# Patient Record
Sex: Female | Born: 1945 | Race: White | Hispanic: No | Smoking: Former smoker
Health system: Southern US, Community
[De-identification: ages and names within clinical notes are randomized; demographics above are authoritative.]

## PROBLEM LIST (undated history)

## (undated) DIAGNOSIS — I2510 Atherosclerotic heart disease of native coronary artery without angina pectoris: Secondary | ICD-10-CM

## (undated) DIAGNOSIS — I4901 Ventricular fibrillation: Secondary | ICD-10-CM

## (undated) DIAGNOSIS — I119 Hypertensive heart disease without heart failure: Secondary | ICD-10-CM

## (undated) DIAGNOSIS — F17200 Nicotine dependence, unspecified, uncomplicated: Secondary | ICD-10-CM

## (undated) DIAGNOSIS — D649 Anemia, unspecified: Secondary | ICD-10-CM

## (undated) DIAGNOSIS — M545 Low back pain, unspecified: Secondary | ICD-10-CM

## (undated) DIAGNOSIS — Z9289 Personal history of other medical treatment: Secondary | ICD-10-CM

## (undated) DIAGNOSIS — E538 Deficiency of other specified B group vitamins: Secondary | ICD-10-CM

## (undated) DIAGNOSIS — I219 Acute myocardial infarction, unspecified: Secondary | ICD-10-CM

## (undated) DIAGNOSIS — I493 Ventricular premature depolarization: Secondary | ICD-10-CM

## (undated) DIAGNOSIS — G4730 Sleep apnea, unspecified: Secondary | ICD-10-CM

## (undated) DIAGNOSIS — K219 Gastro-esophageal reflux disease without esophagitis: Secondary | ICD-10-CM

## (undated) DIAGNOSIS — I10 Essential (primary) hypertension: Secondary | ICD-10-CM

## (undated) DIAGNOSIS — T7840XA Allergy, unspecified, initial encounter: Secondary | ICD-10-CM

## (undated) DIAGNOSIS — F419 Anxiety disorder, unspecified: Secondary | ICD-10-CM

## (undated) DIAGNOSIS — E785 Hyperlipidemia, unspecified: Secondary | ICD-10-CM

## (undated) DIAGNOSIS — J449 Chronic obstructive pulmonary disease, unspecified: Secondary | ICD-10-CM

## (undated) DIAGNOSIS — M1990 Unspecified osteoarthritis, unspecified site: Secondary | ICD-10-CM

## (undated) DIAGNOSIS — D126 Benign neoplasm of colon, unspecified: Secondary | ICD-10-CM

## (undated) DIAGNOSIS — G709 Myoneural disorder, unspecified: Secondary | ICD-10-CM

## (undated) DIAGNOSIS — K5790 Diverticulosis of intestine, part unspecified, without perforation or abscess without bleeding: Secondary | ICD-10-CM

## (undated) DIAGNOSIS — B356 Tinea cruris: Secondary | ICD-10-CM

## (undated) HISTORY — DX: Atherosclerotic heart disease of native coronary artery without angina pectoris: I25.10

## (undated) HISTORY — PX: ABDOMINAL HYSTERECTOMY: SHX81

## (undated) HISTORY — DX: Diverticulosis of intestine, part unspecified, without perforation or abscess without bleeding: K57.90

## (undated) HISTORY — DX: Anxiety disorder, unspecified: F41.9

## (undated) HISTORY — PX: BARIATRIC SURGERY: SHX1103

## (undated) HISTORY — PX: TONSILLECTOMY: SUR1361

## (undated) HISTORY — DX: Myoneural disorder, unspecified: G70.9

## (undated) HISTORY — PX: ULNAR NERVE REPAIR: SHX2594

## (undated) HISTORY — PX: APPENDECTOMY: SHX54

## (undated) HISTORY — DX: Essential (primary) hypertension: I10

## (undated) HISTORY — PX: CHOLECYSTECTOMY: SHX55

## (undated) HISTORY — DX: Tinea cruris: B35.6

## (undated) HISTORY — DX: Anemia, unspecified: D64.9

## (undated) HISTORY — PX: EYE SURGERY: SHX253

## (undated) HISTORY — PX: CERVICAL DISC SURGERY: SHX588

## (undated) HISTORY — PX: CARPAL TUNNEL RELEASE: SHX101

## (undated) HISTORY — PX: NEPHRECTOMY: SHX65

## (undated) HISTORY — DX: Hyperlipidemia, unspecified: E78.5

## (undated) HISTORY — DX: Nicotine dependence, unspecified, uncomplicated: F17.200

## (undated) HISTORY — DX: Personal history of other medical treatment: Z92.89

## (undated) HISTORY — PX: TUBAL LIGATION: SHX77

## (undated) HISTORY — DX: Ventricular premature depolarization: I49.3

## (undated) HISTORY — DX: Gastro-esophageal reflux disease without esophagitis: K21.9

## (undated) HISTORY — DX: Low back pain, unspecified: M54.50

## (undated) HISTORY — PX: LUMBAR LAMINECTOMY: SHX95

## (undated) HISTORY — PX: HERNIA REPAIR: SHX51

## (undated) HISTORY — DX: Chronic obstructive pulmonary disease, unspecified: J44.9

## (undated) HISTORY — PX: JOINT REPLACEMENT: SHX530

## (undated) HISTORY — DX: Deficiency of other specified B group vitamins: E53.8

## (undated) HISTORY — DX: Sleep apnea, unspecified: G47.30

## (undated) HISTORY — DX: Low back pain: M54.5

## (undated) HISTORY — DX: Acute myocardial infarction, unspecified: I21.9

## (undated) HISTORY — DX: Benign neoplasm of colon, unspecified: D12.6

## (undated) HISTORY — DX: Allergy, unspecified, initial encounter: T78.40XA

## (undated) HISTORY — PX: SPINE SURGERY: SHX786

## (undated) HISTORY — PX: TOTAL KNEE ARTHROPLASTY: SHX125

## (undated) HISTORY — DX: Unspecified osteoarthritis, unspecified site: M19.90

---

## 1998-12-03 ENCOUNTER — Other Ambulatory Visit: Admission: RE | Admit: 1998-12-03 | Discharge: 1998-12-03 | Payer: Self-pay | Admitting: Internal Medicine

## 1999-10-25 ENCOUNTER — Encounter: Payer: Self-pay | Admitting: Internal Medicine

## 1999-10-25 ENCOUNTER — Inpatient Hospital Stay (HOSPITAL_COMMUNITY): Admission: EM | Admit: 1999-10-25 | Discharge: 1999-10-26 | Payer: Self-pay | Admitting: Emergency Medicine

## 1999-11-05 ENCOUNTER — Encounter (HOSPITAL_COMMUNITY): Admission: RE | Admit: 1999-11-05 | Discharge: 1999-12-29 | Payer: Self-pay | Admitting: Cardiology

## 1999-12-30 ENCOUNTER — Encounter (HOSPITAL_COMMUNITY): Admission: RE | Admit: 1999-12-30 | Discharge: 2000-02-10 | Payer: Self-pay | Admitting: Cardiology

## 2000-02-04 HISTORY — PX: CORONARY ANGIOPLASTY: SHX604

## 2000-07-31 ENCOUNTER — Ambulatory Visit (HOSPITAL_COMMUNITY): Admission: RE | Admit: 2000-07-31 | Discharge: 2000-07-31 | Payer: Self-pay | Admitting: Cardiology

## 2000-07-31 ENCOUNTER — Encounter: Payer: Self-pay | Admitting: Cardiology

## 2000-08-18 ENCOUNTER — Ambulatory Visit (HOSPITAL_COMMUNITY): Admission: RE | Admit: 2000-08-18 | Discharge: 2000-08-19 | Payer: Self-pay | Admitting: Cardiology

## 2000-09-01 ENCOUNTER — Encounter (HOSPITAL_COMMUNITY): Admission: RE | Admit: 2000-09-01 | Discharge: 2000-11-16 | Payer: Self-pay | Admitting: Cardiology

## 2001-03-18 ENCOUNTER — Ambulatory Visit (HOSPITAL_BASED_OUTPATIENT_CLINIC_OR_DEPARTMENT_OTHER): Admission: RE | Admit: 2001-03-18 | Discharge: 2001-03-18 | Payer: Self-pay | Admitting: Internal Medicine

## 2001-03-18 ENCOUNTER — Encounter: Admission: RE | Admit: 2001-03-18 | Discharge: 2001-05-07 | Payer: Self-pay | Admitting: Internal Medicine

## 2001-07-02 ENCOUNTER — Ambulatory Visit (HOSPITAL_COMMUNITY): Admission: RE | Admit: 2001-07-02 | Discharge: 2001-07-02 | Payer: Self-pay | Admitting: Cardiology

## 2001-07-02 ENCOUNTER — Encounter: Payer: Self-pay | Admitting: Cardiology

## 2001-07-05 ENCOUNTER — Ambulatory Visit (HOSPITAL_COMMUNITY): Admission: RE | Admit: 2001-07-05 | Discharge: 2001-07-06 | Payer: Self-pay | Admitting: Cardiology

## 2002-09-19 ENCOUNTER — Encounter: Payer: Self-pay | Admitting: Neurosurgery

## 2002-09-19 ENCOUNTER — Encounter: Admission: RE | Admit: 2002-09-19 | Discharge: 2002-09-19 | Payer: Self-pay | Admitting: Neurosurgery

## 2002-09-19 ENCOUNTER — Encounter: Payer: Self-pay | Admitting: Diagnostic Radiology

## 2002-12-19 ENCOUNTER — Other Ambulatory Visit: Admission: RE | Admit: 2002-12-19 | Discharge: 2002-12-19 | Payer: Self-pay | Admitting: Neurosurgery

## 2002-12-20 ENCOUNTER — Encounter: Admission: RE | Admit: 2002-12-20 | Discharge: 2002-12-20 | Payer: Self-pay | Admitting: Internal Medicine

## 2003-01-27 ENCOUNTER — Inpatient Hospital Stay (HOSPITAL_COMMUNITY): Admission: EM | Admit: 2003-01-27 | Discharge: 2003-01-30 | Payer: Self-pay | Admitting: Emergency Medicine

## 2003-03-30 ENCOUNTER — Ambulatory Visit (HOSPITAL_COMMUNITY): Admission: RE | Admit: 2003-03-30 | Discharge: 2003-03-30 | Payer: Self-pay | Admitting: Sports Medicine

## 2003-03-30 ENCOUNTER — Ambulatory Visit (HOSPITAL_COMMUNITY): Admission: RE | Admit: 2003-03-30 | Discharge: 2003-03-30 | Payer: Self-pay | Admitting: Internal Medicine

## 2003-05-05 ENCOUNTER — Encounter: Admission: RE | Admit: 2003-05-05 | Discharge: 2003-05-05 | Payer: Self-pay | Admitting: Orthopedic Surgery

## 2003-05-08 ENCOUNTER — Ambulatory Visit (HOSPITAL_COMMUNITY): Admission: RE | Admit: 2003-05-08 | Discharge: 2003-05-08 | Payer: Self-pay | Admitting: Orthopedic Surgery

## 2003-05-08 ENCOUNTER — Ambulatory Visit (HOSPITAL_BASED_OUTPATIENT_CLINIC_OR_DEPARTMENT_OTHER): Admission: RE | Admit: 2003-05-08 | Discharge: 2003-05-08 | Payer: Self-pay | Admitting: Orthopedic Surgery

## 2003-06-27 ENCOUNTER — Inpatient Hospital Stay (HOSPITAL_COMMUNITY): Admission: RE | Admit: 2003-06-27 | Discharge: 2003-06-29 | Payer: Self-pay | Admitting: Neurosurgery

## 2003-07-21 ENCOUNTER — Ambulatory Visit (HOSPITAL_COMMUNITY): Admission: RE | Admit: 2003-07-21 | Discharge: 2003-07-21 | Payer: Self-pay | Admitting: Internal Medicine

## 2003-10-04 ENCOUNTER — Encounter: Admission: RE | Admit: 2003-10-04 | Discharge: 2003-11-14 | Payer: Self-pay | Admitting: Neurosurgery

## 2003-12-27 ENCOUNTER — Ambulatory Visit: Payer: Self-pay | Admitting: Internal Medicine

## 2004-01-02 ENCOUNTER — Ambulatory Visit: Payer: Self-pay | Admitting: Internal Medicine

## 2004-01-08 ENCOUNTER — Ambulatory Visit: Payer: Self-pay | Admitting: Internal Medicine

## 2004-01-11 ENCOUNTER — Ambulatory Visit: Payer: Self-pay | Admitting: Internal Medicine

## 2004-01-17 ENCOUNTER — Ambulatory Visit: Payer: Self-pay | Admitting: Internal Medicine

## 2004-02-04 DIAGNOSIS — D126 Benign neoplasm of colon, unspecified: Secondary | ICD-10-CM | POA: Insufficient documentation

## 2004-02-04 HISTORY — DX: Benign neoplasm of colon, unspecified: D12.6

## 2004-02-07 ENCOUNTER — Ambulatory Visit: Payer: Self-pay | Admitting: Internal Medicine

## 2004-03-21 ENCOUNTER — Encounter: Payer: Self-pay | Admitting: Internal Medicine

## 2004-04-18 ENCOUNTER — Ambulatory Visit: Payer: Self-pay | Admitting: Internal Medicine

## 2004-05-09 ENCOUNTER — Ambulatory Visit: Payer: Self-pay | Admitting: Internal Medicine

## 2004-06-06 ENCOUNTER — Ambulatory Visit: Payer: Self-pay | Admitting: Internal Medicine

## 2004-12-25 ENCOUNTER — Ambulatory Visit: Payer: Self-pay | Admitting: Internal Medicine

## 2005-01-02 ENCOUNTER — Ambulatory Visit: Payer: Self-pay | Admitting: Internal Medicine

## 2005-01-07 ENCOUNTER — Ambulatory Visit: Payer: Self-pay | Admitting: Family Medicine

## 2005-01-29 ENCOUNTER — Ambulatory Visit: Payer: Self-pay | Admitting: Endocrinology

## 2005-02-05 ENCOUNTER — Ambulatory Visit: Payer: Self-pay | Admitting: Cardiology

## 2005-02-06 ENCOUNTER — Ambulatory Visit: Payer: Self-pay

## 2005-02-11 ENCOUNTER — Ambulatory Visit (HOSPITAL_BASED_OUTPATIENT_CLINIC_OR_DEPARTMENT_OTHER): Admission: RE | Admit: 2005-02-11 | Discharge: 2005-02-11 | Payer: Self-pay | Admitting: Orthopedic Surgery

## 2005-02-24 ENCOUNTER — Encounter: Admission: RE | Admit: 2005-02-24 | Discharge: 2005-03-26 | Payer: Self-pay | Admitting: Orthopedic Surgery

## 2005-04-24 ENCOUNTER — Encounter: Admission: RE | Admit: 2005-04-24 | Discharge: 2005-06-10 | Payer: Self-pay | Admitting: Neurosurgery

## 2005-04-30 ENCOUNTER — Ambulatory Visit: Payer: Self-pay | Admitting: Internal Medicine

## 2005-05-23 ENCOUNTER — Ambulatory Visit: Payer: Self-pay | Admitting: Internal Medicine

## 2005-06-10 ENCOUNTER — Encounter: Admission: RE | Admit: 2005-06-10 | Discharge: 2005-07-03 | Payer: Self-pay | Admitting: Orthopedic Surgery

## 2005-07-03 ENCOUNTER — Ambulatory Visit (HOSPITAL_COMMUNITY): Admission: RE | Admit: 2005-07-03 | Discharge: 2005-07-03 | Payer: Self-pay | Admitting: Neurosurgery

## 2005-09-15 ENCOUNTER — Inpatient Hospital Stay (HOSPITAL_COMMUNITY): Admission: RE | Admit: 2005-09-15 | Discharge: 2005-09-18 | Payer: Self-pay | Admitting: Orthopedic Surgery

## 2005-10-13 ENCOUNTER — Encounter: Admission: RE | Admit: 2005-10-13 | Discharge: 2005-11-02 | Payer: Self-pay | Admitting: Orthopedic Surgery

## 2005-11-03 ENCOUNTER — Encounter: Admission: RE | Admit: 2005-11-03 | Discharge: 2005-11-10 | Payer: Self-pay | Admitting: Orthopedic Surgery

## 2005-12-11 ENCOUNTER — Ambulatory Visit: Payer: Self-pay | Admitting: Internal Medicine

## 2005-12-11 LAB — CONVERTED CEMR LAB
ALT: 10 units/L (ref 0–40)
AST: 13 units/L (ref 0–37)
Albumin: 4 g/dL (ref 3.5–5.2)
Alkaline Phosphatase: 116 units/L (ref 39–117)
BUN: 18 mg/dL (ref 6–23)
Basophils Absolute: 0.1 10*3/uL (ref 0.0–0.1)
Basophils Relative: 0.9 % (ref 0.0–1.0)
CO2: 31 meq/L (ref 19–32)
Calcium: 10.3 mg/dL (ref 8.4–10.5)
Chloride: 105 meq/L (ref 96–112)
Chol/HDL Ratio, serum: 3.8
Cholesterol: 174 mg/dL (ref 0–200)
Creatinine, Ser: 1 mg/dL (ref 0.4–1.2)
Creatinine,U: 130.9 mg/dL
Eosinophil percent: 1.9 % (ref 0.0–5.0)
GFR calc non Af Amer: 61 mL/min
Glomerular Filtration Rate, Af Am: 73 mL/min/{1.73_m2}
Glucose, Bld: 109 mg/dL — ABNORMAL HIGH (ref 70–99)
HCT: 41.8 % (ref 36.0–46.0)
HDL: 46.3 mg/dL (ref >39.0)
Hemoglobin: 14.2 g/dL (ref 12.0–15.0)
Hgb A1c MFr Bld: 5.5 % (ref 4.6–6.0)
LDL Cholesterol: 103 mg/dL — ABNORMAL HIGH (ref 0–99)
Lymphocytes Relative: 25.5 % (ref 12.0–46.0)
MCHC: 34 g/dL (ref 30.0–36.0)
MCV: 83.7 fL (ref 78.0–100.0)
Microalb Creat Ratio: 3.1 mg/g (ref 0.0–30.0)
Microalb, Ur: 0.4 mg/dL (ref 0.0–1.9)
Monocytes Absolute: 0.5 10*3/uL (ref 0.2–0.7)
Monocytes Relative: 7.4 % (ref 3.0–11.0)
Neutro Abs: 3.9 10*3/uL (ref 1.4–7.7)
Neutrophils Relative %: 64.3 % (ref 43.0–77.0)
Platelets: 311 10*3/uL (ref 150–400)
Potassium: 4 meq/L (ref 3.5–5.1)
RBC: 4.99 M/uL (ref 3.87–5.11)
RDW: 11.2 % — ABNORMAL LOW (ref 11.5–14.6)
Sodium: 145 meq/L (ref 135–145)
TSH: 1.88 microintl units/mL (ref 0.35–5.50)
Total Bilirubin: 0.5 mg/dL (ref 0.3–1.2)
Total Protein: 7.2 g/dL (ref 6.0–8.3)
Triglyceride fasting, serum: 122 mg/dL (ref 0–149)
VLDL: 24 mg/dL (ref 0–40)
WBC: 6.2 10*3/uL (ref 4.5–10.5)

## 2005-12-13 LAB — HM MAMMOGRAPHY

## 2005-12-18 ENCOUNTER — Other Ambulatory Visit: Admission: RE | Admit: 2005-12-18 | Discharge: 2005-12-18 | Payer: Self-pay | Admitting: Internal Medicine

## 2005-12-18 ENCOUNTER — Ambulatory Visit: Payer: Self-pay | Admitting: Internal Medicine

## 2005-12-18 ENCOUNTER — Encounter: Payer: Self-pay | Admitting: Internal Medicine

## 2005-12-29 ENCOUNTER — Ambulatory Visit: Payer: Self-pay | Admitting: Internal Medicine

## 2006-02-06 ENCOUNTER — Ambulatory Visit: Payer: Self-pay | Admitting: Cardiology

## 2006-05-04 ENCOUNTER — Encounter: Payer: Self-pay | Admitting: Internal Medicine

## 2006-05-18 ENCOUNTER — Ambulatory Visit: Payer: Self-pay | Admitting: Internal Medicine

## 2006-05-18 ENCOUNTER — Encounter: Payer: Self-pay | Admitting: Internal Medicine

## 2006-09-22 ENCOUNTER — Ambulatory Visit: Payer: Self-pay | Admitting: Internal Medicine

## 2006-09-22 DIAGNOSIS — E1159 Type 2 diabetes mellitus with other circulatory complications: Secondary | ICD-10-CM | POA: Insufficient documentation

## 2006-09-22 DIAGNOSIS — I10 Essential (primary) hypertension: Secondary | ICD-10-CM

## 2006-09-22 DIAGNOSIS — M47816 Spondylosis without myelopathy or radiculopathy, lumbar region: Secondary | ICD-10-CM | POA: Insufficient documentation

## 2006-09-22 DIAGNOSIS — I2510 Atherosclerotic heart disease of native coronary artery without angina pectoris: Secondary | ICD-10-CM | POA: Insufficient documentation

## 2006-09-22 DIAGNOSIS — M159 Polyosteoarthritis, unspecified: Secondary | ICD-10-CM | POA: Insufficient documentation

## 2006-09-22 DIAGNOSIS — Z9861 Coronary angioplasty status: Secondary | ICD-10-CM

## 2006-09-22 DIAGNOSIS — E1169 Type 2 diabetes mellitus with other specified complication: Secondary | ICD-10-CM | POA: Insufficient documentation

## 2006-09-22 DIAGNOSIS — F329 Major depressive disorder, single episode, unspecified: Secondary | ICD-10-CM | POA: Insufficient documentation

## 2006-09-22 DIAGNOSIS — E785 Hyperlipidemia, unspecified: Secondary | ICD-10-CM

## 2006-09-22 DIAGNOSIS — M1990 Unspecified osteoarthritis, unspecified site: Secondary | ICD-10-CM

## 2006-10-06 ENCOUNTER — Telehealth: Payer: Self-pay | Admitting: Internal Medicine

## 2006-12-07 ENCOUNTER — Ambulatory Visit: Payer: Self-pay | Admitting: Internal Medicine

## 2006-12-07 LAB — CONVERTED CEMR LAB
ALT: 16 units/L (ref 0–35)
AST: 22 units/L (ref 0–37)
Albumin: 3.8 g/dL (ref 3.5–5.2)
Alkaline Phosphatase: 149 units/L — ABNORMAL HIGH (ref 39–117)
BUN: 14 mg/dL (ref 6–23)
Basophils Absolute: 0 10*3/uL (ref 0.0–0.1)
Basophils Relative: 0.6 % (ref 0.0–1.0)
Bilirubin Urine: NEGATIVE
Bilirubin, Direct: 0.1 mg/dL (ref 0.0–0.3)
Blood in Urine, dipstick: NEGATIVE
CO2: 33 meq/L — ABNORMAL HIGH (ref 19–32)
Calcium: 9.9 mg/dL (ref 8.4–10.5)
Chloride: 109 meq/L (ref 96–112)
Cholesterol: 155 mg/dL (ref 0–200)
Creatinine, Ser: 1 mg/dL (ref 0.4–1.2)
Creatinine,U: 146.1 mg/dL
Eosinophils Absolute: 0.1 10*3/uL (ref 0.0–0.6)
Eosinophils Relative: 2.5 % (ref 0.0–5.0)
GFR calc Af Amer: 73 mL/min
GFR calc non Af Amer: 60 mL/min
Glucose, Bld: 88 mg/dL (ref 70–99)
Glucose, Urine, Semiquant: NEGATIVE
HCT: 38.1 % (ref 36.0–46.0)
HDL: 45.1 mg/dL (ref >39.0)
Hemoglobin: 13.1 g/dL (ref 12.0–15.0)
Hgb A1c MFr Bld: 5.7 % (ref 4.6–6.0)
Ketones, urine, test strip: NEGATIVE
LDL Cholesterol: 87 mg/dL (ref 0–99)
Lymphocytes Relative: 28.4 % (ref 12.0–46.0)
MCHC: 34.6 g/dL (ref 30.0–36.0)
MCV: 83.9 fL (ref 78.0–100.0)
Microalb Creat Ratio: 7.5 mg/g (ref 0.0–30.0)
Microalb, Ur: 1.1 mg/dL (ref 0.0–1.9)
Monocytes Absolute: 0.4 10*3/uL (ref 0.2–0.7)
Monocytes Relative: 7.1 % (ref 3.0–11.0)
Neutro Abs: 3.3 10*3/uL (ref 1.4–7.7)
Neutrophils Relative %: 61.4 % (ref 43.0–77.0)
Nitrite: NEGATIVE
Platelets: 221 10*3/uL (ref 150–400)
Potassium: 4.6 meq/L (ref 3.5–5.1)
Protein, U semiquant: NEGATIVE
RBC: 4.56 M/uL (ref 3.87–5.11)
RDW: 11.5 % (ref 11.5–14.6)
Sodium: 151 meq/L — ABNORMAL HIGH (ref 135–145)
Specific Gravity, Urine: 1.025
TSH: 2.11 microintl units/mL (ref 0.35–5.50)
Total Bilirubin: 0.5 mg/dL (ref 0.3–1.2)
Total CHOL/HDL Ratio: 3.4
Total Protein: 6.2 g/dL (ref 6.0–8.3)
Triglycerides: 113 mg/dL (ref 0–149)
Urobilinogen, UA: 0.2
VLDL: 23 mg/dL (ref 0–40)
Vit D, 1,25-Dihydroxy: 28 — ABNORMAL LOW (ref 30–89)
Vitamin B-12: 323 pg/mL (ref 211–911)
WBC Urine, dipstick: NEGATIVE
WBC: 5.5 10*3/uL (ref 4.5–10.5)
pH: 5.5

## 2006-12-14 ENCOUNTER — Ambulatory Visit: Payer: Self-pay | Admitting: Internal Medicine

## 2006-12-14 ENCOUNTER — Telehealth: Payer: Self-pay | Admitting: Internal Medicine

## 2006-12-14 DIAGNOSIS — M810 Age-related osteoporosis without current pathological fracture: Secondary | ICD-10-CM | POA: Insufficient documentation

## 2006-12-14 LAB — CONVERTED CEMR LAB: Pap Smear: NORMAL

## 2006-12-16 ENCOUNTER — Encounter: Admission: RE | Admit: 2006-12-16 | Discharge: 2006-12-16 | Payer: Self-pay | Admitting: Internal Medicine

## 2006-12-18 ENCOUNTER — Telehealth: Payer: Self-pay | Admitting: *Deleted

## 2006-12-21 ENCOUNTER — Telehealth: Payer: Self-pay | Admitting: *Deleted

## 2006-12-30 ENCOUNTER — Telehealth: Payer: Self-pay | Admitting: Internal Medicine

## 2007-03-15 ENCOUNTER — Ambulatory Visit: Payer: Self-pay | Admitting: Internal Medicine

## 2007-03-15 DIAGNOSIS — R51 Headache: Secondary | ICD-10-CM | POA: Insufficient documentation

## 2007-03-15 DIAGNOSIS — R519 Headache, unspecified: Secondary | ICD-10-CM | POA: Insufficient documentation

## 2007-03-15 LAB — CONVERTED CEMR LAB
BUN: 18 mg/dL (ref 6–23)
CO2: 31 meq/L (ref 19–32)
Calcium: 9.9 mg/dL (ref 8.4–10.5)
Chloride: 106 meq/L (ref 96–112)
Creatinine, Ser: 1 mg/dL (ref 0.4–1.2)
GFR calc Af Amer: 73 mL/min
GFR calc non Af Amer: 60 mL/min
Glucose, Bld: 109 mg/dL — ABNORMAL HIGH (ref 70–99)
Potassium: 5.2 meq/L — ABNORMAL HIGH (ref 3.5–5.1)
Sodium: 146 meq/L — ABNORMAL HIGH (ref 135–145)

## 2007-03-17 LAB — CONVERTED CEMR LAB: Vit D, 1,25-Dihydroxy: 27 — ABNORMAL LOW (ref 30–89)

## 2007-03-18 ENCOUNTER — Ambulatory Visit: Payer: Self-pay | Admitting: Cardiology

## 2007-05-13 ENCOUNTER — Ambulatory Visit: Payer: Self-pay | Admitting: Internal Medicine

## 2007-05-13 ENCOUNTER — Encounter: Payer: Self-pay | Admitting: Internal Medicine

## 2007-05-13 LAB — CONVERTED CEMR LAB
BUN: 17 mg/dL (ref 6–23)
CO2: 31 meq/L (ref 19–32)
Calcium: 9.7 mg/dL (ref 8.4–10.5)
Chloride: 105 meq/L (ref 96–112)
Creatinine, Ser: 0.9 mg/dL (ref 0.4–1.2)
GFR calc Af Amer: 82 mL/min
GFR calc non Af Amer: 68 mL/min
Glucose, Bld: 84 mg/dL (ref 70–99)
Potassium: 4.1 meq/L (ref 3.5–5.1)
Sodium: 146 meq/L — ABNORMAL HIGH (ref 135–145)

## 2007-06-08 LAB — CONVERTED CEMR LAB: Vit D, 1,25-Dihydroxy: 26 — ABNORMAL LOW (ref 30–89)

## 2007-06-24 ENCOUNTER — Encounter: Payer: Self-pay | Admitting: Internal Medicine

## 2007-10-19 ENCOUNTER — Ambulatory Visit: Payer: Self-pay | Admitting: Internal Medicine

## 2007-10-27 ENCOUNTER — Telehealth: Payer: Self-pay | Admitting: Internal Medicine

## 2007-11-19 ENCOUNTER — Ambulatory Visit: Payer: Self-pay | Admitting: Internal Medicine

## 2008-02-16 ENCOUNTER — Telehealth: Payer: Self-pay | Admitting: Internal Medicine

## 2008-03-28 ENCOUNTER — Ambulatory Visit: Payer: Self-pay | Admitting: Internal Medicine

## 2008-03-28 LAB — CONVERTED CEMR LAB
ALT: 13 units/L (ref 0–35)
AST: 20 units/L (ref 0–37)
Albumin: 3.9 g/dL (ref 3.5–5.2)
Alkaline Phosphatase: 135 units/L — ABNORMAL HIGH (ref 39–117)
BUN: 16 mg/dL (ref 6–23)
Basophils Absolute: 0 10*3/uL (ref 0.0–0.1)
Basophils Relative: 0.6 % (ref 0.0–3.0)
Bilirubin Urine: NEGATIVE
Bilirubin, Direct: 0.1 mg/dL (ref 0.0–0.3)
CO2: 29 meq/L (ref 19–32)
Calcium: 9.7 mg/dL (ref 8.4–10.5)
Chloride: 107 meq/L (ref 96–112)
Cholesterol: 232 mg/dL (ref 0–200)
Creatinine, Ser: 0.9 mg/dL (ref 0.4–1.2)
Creatinine,U: 168.1 mg/dL
Direct LDL: 160.9 mg/dL
Eosinophils Absolute: 0.1 10*3/uL (ref 0.0–0.7)
Eosinophils Relative: 1.8 % (ref 0.0–5.0)
GFR calc Af Amer: 82 mL/min
GFR calc non Af Amer: 68 mL/min
Glucose, Bld: 98 mg/dL (ref 70–99)
Glucose, Urine, Semiquant: NEGATIVE
HCT: 38.1 % (ref 36.0–46.0)
HDL: 44.6 mg/dL (ref >39.0)
Hemoglobin: 12.8 g/dL (ref 12.0–15.0)
Hgb A1c MFr Bld: 5.9 % (ref 4.6–6.0)
Iron: 34 ug/dL — ABNORMAL LOW (ref 42–145)
Ketones, urine, test strip: NEGATIVE
Lymphocytes Relative: 24.4 % (ref 12.0–46.0)
MCHC: 33.5 g/dL (ref 30.0–36.0)
MCV: 81.5 fL (ref 78.0–100.0)
Microalb Creat Ratio: 31.5 mg/g — ABNORMAL HIGH (ref 0.0–30.0)
Microalb, Ur: 5.3 mg/dL — ABNORMAL HIGH (ref 0.0–1.9)
Monocytes Absolute: 0.4 10*3/uL (ref 0.1–1.0)
Monocytes Relative: 6.3 % (ref 3.0–12.0)
Neutro Abs: 3.7 10*3/uL (ref 1.4–7.7)
Neutrophils Relative %: 66.9 % (ref 43.0–77.0)
Nitrite: POSITIVE
Platelets: 237 10*3/uL (ref 150–400)
Potassium: 3.6 meq/L (ref 3.5–5.1)
RBC: 4.68 M/uL (ref 3.87–5.11)
RDW: 12.7 % (ref 11.5–14.6)
Sodium: 148 meq/L — ABNORMAL HIGH (ref 135–145)
Specific Gravity, Urine: 1.02
TSH: 1.78 microintl units/mL (ref 0.35–5.50)
Total Bilirubin: 0.7 mg/dL (ref 0.3–1.2)
Total CHOL/HDL Ratio: 5.2
Total Protein: 6.8 g/dL (ref 6.0–8.3)
Triglycerides: 111 mg/dL (ref 0–149)
Urobilinogen, UA: 0.2
VLDL: 22 mg/dL (ref 0–40)
Vitamin B-12: 224 pg/mL (ref 211–911)
WBC: 5.6 10*3/uL (ref 4.5–10.5)
pH: 5.5

## 2008-04-04 ENCOUNTER — Ambulatory Visit: Payer: Self-pay | Admitting: Internal Medicine

## 2008-04-04 LAB — CONVERTED CEMR LAB: Vit D, 25-Hydroxy: 21 ng/mL — ABNORMAL LOW (ref 30–89)

## 2008-04-18 ENCOUNTER — Telehealth (INDEPENDENT_AMBULATORY_CARE_PROVIDER_SITE_OTHER): Payer: Self-pay | Admitting: *Deleted

## 2008-05-10 ENCOUNTER — Encounter: Payer: Self-pay | Admitting: Internal Medicine

## 2008-05-11 LAB — HM PAP SMEAR

## 2008-05-15 ENCOUNTER — Telehealth: Payer: Self-pay | Admitting: Internal Medicine

## 2008-05-29 ENCOUNTER — Ambulatory Visit: Payer: Self-pay | Admitting: Internal Medicine

## 2008-05-29 ENCOUNTER — Encounter: Payer: Self-pay | Admitting: Internal Medicine

## 2008-06-26 ENCOUNTER — Telehealth: Payer: Self-pay | Admitting: *Deleted

## 2009-02-16 ENCOUNTER — Encounter (INDEPENDENT_AMBULATORY_CARE_PROVIDER_SITE_OTHER): Payer: Self-pay | Admitting: *Deleted

## 2009-03-06 ENCOUNTER — Encounter: Payer: Self-pay | Admitting: Internal Medicine

## 2009-03-15 ENCOUNTER — Encounter: Payer: Self-pay | Admitting: Internal Medicine

## 2009-04-04 ENCOUNTER — Ambulatory Visit: Payer: Self-pay | Admitting: Internal Medicine

## 2009-04-04 DIAGNOSIS — D518 Other vitamin B12 deficiency anemias: Secondary | ICD-10-CM | POA: Insufficient documentation

## 2009-04-04 DIAGNOSIS — L501 Idiopathic urticaria: Secondary | ICD-10-CM | POA: Insufficient documentation

## 2009-04-04 DIAGNOSIS — R252 Cramp and spasm: Secondary | ICD-10-CM | POA: Insufficient documentation

## 2009-04-04 DIAGNOSIS — N390 Urinary tract infection, site not specified: Secondary | ICD-10-CM | POA: Insufficient documentation

## 2009-04-04 LAB — CONVERTED CEMR LAB
ALT: 12 units/L (ref 0–35)
AST: 17 units/L (ref 0–37)
Albumin: 3.7 g/dL (ref 3.5–5.2)
Alkaline Phosphatase: 128 units/L — ABNORMAL HIGH (ref 39–117)
BUN: 18 mg/dL (ref 6–23)
Basophils Absolute: 0.1 10*3/uL (ref 0.0–0.1)
Basophils Relative: 0.9 % (ref 0.0–3.0)
Bilirubin, Direct: 0.1 mg/dL (ref 0.0–0.3)
CO2: 29 meq/L (ref 19–32)
Calcium: 9.4 mg/dL (ref 8.4–10.5)
Chloride: 107 meq/L (ref 96–112)
Creatinine, Ser: 0.9 mg/dL (ref 0.4–1.2)
Eosinophils Absolute: 0.2 10*3/uL (ref 0.0–0.7)
Eosinophils Relative: 2.5 % (ref 0.0–5.0)
GFR calc non Af Amer: 67.52 mL/min (ref >60)
Glucose, Bld: 85 mg/dL (ref 70–99)
HCT: 37.1 % (ref 36.0–46.0)
Hemoglobin: 12.3 g/dL (ref 12.0–15.0)
Lymphocytes Relative: 28.8 % (ref 12.0–46.0)
Lymphs Abs: 2 10*3/uL (ref 0.7–4.0)
MCHC: 33.2 g/dL (ref 30.0–36.0)
MCV: 81.8 fL (ref 78.0–100.0)
Monocytes Absolute: 0.5 10*3/uL (ref 0.1–1.0)
Monocytes Relative: 6.7 % (ref 3.0–12.0)
Neutro Abs: 4.3 10*3/uL (ref 1.4–7.7)
Neutrophils Relative %: 61.1 % (ref 43.0–77.0)
Platelets: 241 10*3/uL (ref 150.0–400.0)
Potassium: 4.6 meq/L (ref 3.5–5.1)
RBC: 4.54 M/uL (ref 3.87–5.11)
RDW: 13.3 % (ref 11.5–14.6)
Sodium: 141 meq/L (ref 135–145)
Total Bilirubin: 0.3 mg/dL (ref 0.3–1.2)
Total Protein: 6.9 g/dL (ref 6.0–8.3)
WBC: 7.1 10*3/uL (ref 4.5–10.5)

## 2009-04-05 ENCOUNTER — Encounter: Payer: Self-pay | Admitting: Internal Medicine

## 2009-04-23 ENCOUNTER — Telehealth: Payer: Self-pay | Admitting: Internal Medicine

## 2009-05-02 ENCOUNTER — Ambulatory Visit: Payer: Self-pay | Admitting: Internal Medicine

## 2009-05-02 LAB — CONVERTED CEMR LAB
ALT: 16 units/L (ref 0–35)
AST: 19 units/L (ref 0–37)
Albumin: 3.7 g/dL (ref 3.5–5.2)
Alkaline Phosphatase: 102 units/L (ref 39–117)
BUN: 18 mg/dL (ref 6–23)
Basophils Absolute: 0 10*3/uL (ref 0.0–0.1)
Basophils Relative: 0.5 % (ref 0.0–3.0)
Bilirubin, Direct: 0 mg/dL (ref 0.0–0.3)
CO2: 30 meq/L (ref 19–32)
Calcium: 9.8 mg/dL (ref 8.4–10.5)
Chloride: 108 meq/L (ref 96–112)
Cholesterol: 195 mg/dL (ref 0–200)
Creatinine, Ser: 0.9 mg/dL (ref 0.4–1.2)
Creatinine,U: 222.5 mg/dL
Eosinophils Absolute: 0.1 10*3/uL (ref 0.0–0.7)
Eosinophils Relative: 2.7 % (ref 0.0–5.0)
GFR calc non Af Amer: 67.51 mL/min (ref >60)
Glucose, Bld: 86 mg/dL (ref 70–99)
Glucose, Urine, Semiquant: NEGATIVE
HCT: 37.6 % (ref 36.0–46.0)
HDL: 57.2 mg/dL (ref >39.00)
Hemoglobin: 12.4 g/dL (ref 12.0–15.0)
Hgb A1c MFr Bld: 6.2 % (ref 4.6–6.5)
LDL Cholesterol: 114 mg/dL — ABNORMAL HIGH (ref 0–99)
Lymphocytes Relative: 24.9 % (ref 12.0–46.0)
Lymphs Abs: 1.3 10*3/uL (ref 0.7–4.0)
MCHC: 33 g/dL (ref 30.0–36.0)
MCV: 83.8 fL (ref 78.0–100.0)
Microalb Creat Ratio: 5.8 mg/g (ref 0.0–30.0)
Microalb, Ur: 1.3 mg/dL (ref 0.0–1.9)
Monocytes Absolute: 0.3 10*3/uL (ref 0.1–1.0)
Monocytes Relative: 6.4 % (ref 3.0–12.0)
Neutro Abs: 3.6 10*3/uL (ref 1.4–7.7)
Neutrophils Relative %: 65.5 % (ref 43.0–77.0)
Nitrite: NEGATIVE
Platelets: 211 10*3/uL (ref 150.0–400.0)
Potassium: 4.8 meq/L (ref 3.5–5.1)
RBC: 4.48 M/uL (ref 3.87–5.11)
RDW: 14.3 % (ref 11.5–14.6)
Sodium: 146 meq/L — ABNORMAL HIGH (ref 135–145)
Specific Gravity, Urine: 1.025
TSH: 2.87 microintl units/mL (ref 0.35–5.50)
Total Bilirubin: 0.3 mg/dL (ref 0.3–1.2)
Total CHOL/HDL Ratio: 3
Total Protein: 6.8 g/dL (ref 6.0–8.3)
Triglycerides: 121 mg/dL (ref 0.0–149.0)
Urobilinogen, UA: 0.2
VLDL: 24.2 mg/dL (ref 0.0–40.0)
WBC Urine, dipstick: NEGATIVE
WBC: 5.3 10*3/uL (ref 4.5–10.5)
pH: 5

## 2009-05-09 ENCOUNTER — Ambulatory Visit: Payer: Self-pay | Admitting: Internal Medicine

## 2009-05-11 ENCOUNTER — Encounter: Payer: Self-pay | Admitting: Internal Medicine

## 2009-05-17 ENCOUNTER — Telehealth: Payer: Self-pay | Admitting: Internal Medicine

## 2009-07-06 ENCOUNTER — Ambulatory Visit (HOSPITAL_BASED_OUTPATIENT_CLINIC_OR_DEPARTMENT_OTHER): Admission: RE | Admit: 2009-07-06 | Discharge: 2009-07-06 | Payer: Self-pay | Admitting: Orthopedic Surgery

## 2009-07-23 ENCOUNTER — Encounter (INDEPENDENT_AMBULATORY_CARE_PROVIDER_SITE_OTHER): Payer: Self-pay | Admitting: *Deleted

## 2009-07-26 ENCOUNTER — Ambulatory Visit: Payer: Self-pay | Admitting: Internal Medicine

## 2009-07-30 ENCOUNTER — Telehealth: Payer: Self-pay | Admitting: Internal Medicine

## 2009-08-02 ENCOUNTER — Telehealth (INDEPENDENT_AMBULATORY_CARE_PROVIDER_SITE_OTHER): Payer: Self-pay | Admitting: *Deleted

## 2009-08-17 ENCOUNTER — Telehealth: Payer: Self-pay | Admitting: Internal Medicine

## 2009-08-23 ENCOUNTER — Ambulatory Visit: Payer: Self-pay | Admitting: Internal Medicine

## 2009-08-23 DIAGNOSIS — B356 Tinea cruris: Secondary | ICD-10-CM | POA: Insufficient documentation

## 2009-08-23 LAB — CONVERTED CEMR LAB
Bilirubin Urine: NEGATIVE
Blood in Urine, dipstick: NEGATIVE
Glucose, Urine, Semiquant: NEGATIVE
Ketones, urine, test strip: NEGATIVE
Nitrite: NEGATIVE
Protein, U semiquant: NEGATIVE
Specific Gravity, Urine: 1.025
Urobilinogen, UA: 0.2
WBC Urine, dipstick: NEGATIVE
pH: 5

## 2009-08-27 ENCOUNTER — Ambulatory Visit: Payer: Self-pay | Admitting: Internal Medicine

## 2009-08-27 ENCOUNTER — Ambulatory Visit (HOSPITAL_COMMUNITY): Admission: RE | Admit: 2009-08-27 | Discharge: 2009-08-27 | Payer: Self-pay | Admitting: Internal Medicine

## 2009-08-27 ENCOUNTER — Encounter: Payer: Self-pay | Admitting: Internal Medicine

## 2009-08-28 ENCOUNTER — Encounter: Payer: Self-pay | Admitting: Internal Medicine

## 2009-08-28 LAB — HM COLONOSCOPY

## 2009-08-29 ENCOUNTER — Encounter: Payer: Self-pay | Admitting: Internal Medicine

## 2009-10-03 DIAGNOSIS — F411 Generalized anxiety disorder: Secondary | ICD-10-CM | POA: Insufficient documentation

## 2009-10-03 DIAGNOSIS — G4730 Sleep apnea, unspecified: Secondary | ICD-10-CM | POA: Insufficient documentation

## 2009-10-03 HISTORY — DX: Sleep apnea, unspecified: G47.30

## 2009-10-05 ENCOUNTER — Ambulatory Visit: Payer: Self-pay | Admitting: Cardiology

## 2009-10-05 DIAGNOSIS — R079 Chest pain, unspecified: Secondary | ICD-10-CM | POA: Insufficient documentation

## 2009-10-05 DIAGNOSIS — F17200 Nicotine dependence, unspecified, uncomplicated: Secondary | ICD-10-CM | POA: Insufficient documentation

## 2009-10-05 HISTORY — DX: Nicotine dependence, unspecified, uncomplicated: F17.200

## 2009-10-29 ENCOUNTER — Telehealth (INDEPENDENT_AMBULATORY_CARE_PROVIDER_SITE_OTHER): Payer: Self-pay | Admitting: *Deleted

## 2009-10-30 ENCOUNTER — Encounter: Payer: Self-pay | Admitting: Cardiology

## 2009-10-30 ENCOUNTER — Ambulatory Visit: Payer: Self-pay

## 2009-10-30 ENCOUNTER — Encounter (HOSPITAL_COMMUNITY): Admission: RE | Admit: 2009-10-30 | Discharge: 2010-01-07 | Payer: Self-pay | Admitting: Cardiology

## 2009-10-30 ENCOUNTER — Ambulatory Visit: Payer: Self-pay | Admitting: Internal Medicine

## 2009-10-30 ENCOUNTER — Ambulatory Visit: Payer: Self-pay | Admitting: Cardiology

## 2009-10-30 LAB — CONVERTED CEMR LAB
ALT: 11 units/L (ref 0–35)
AST: 19 units/L (ref 0–37)
Albumin: 4 g/dL (ref 3.5–5.2)
Alkaline Phosphatase: 118 units/L — ABNORMAL HIGH (ref 39–117)
Basophils Absolute: 0 10*3/uL (ref 0.0–0.1)
Basophils Relative: 0.5 % (ref 0.0–3.0)
Bilirubin, Direct: 0.1 mg/dL (ref 0.0–0.3)
Cholesterol: 256 mg/dL — ABNORMAL HIGH (ref 0–200)
Direct LDL: 174.2 mg/dL
Eosinophils Absolute: 0.1 10*3/uL (ref 0.0–0.7)
Eosinophils Relative: 1.9 % (ref 0.0–5.0)
HCT: 35.7 % — ABNORMAL LOW (ref 36.0–46.0)
HDL: 44.9 mg/dL (ref >39.00)
Hemoglobin: 12.3 g/dL (ref 12.0–15.0)
Lymphocytes Relative: 18.7 % (ref 12.0–46.0)
Lymphs Abs: 0.9 10*3/uL (ref 0.7–4.0)
MCHC: 34.3 g/dL (ref 30.0–36.0)
MCV: 82.1 fL (ref 78.0–100.0)
Monocytes Absolute: 0.4 10*3/uL (ref 0.1–1.0)
Monocytes Relative: 9.1 % (ref 3.0–12.0)
Neutro Abs: 3.2 10*3/uL (ref 1.4–7.7)
Neutrophils Relative %: 69.8 % (ref 43.0–77.0)
Platelets: 238 10*3/uL (ref 150.0–400.0)
RBC: 4.35 M/uL (ref 3.87–5.11)
RDW: 14.3 % (ref 11.5–14.6)
TSH: 1.73 microintl units/mL (ref 0.35–5.50)
Total Bilirubin: 0.6 mg/dL (ref 0.3–1.2)
Total CHOL/HDL Ratio: 6
Total Protein: 6.7 g/dL (ref 6.0–8.3)
Triglycerides: 147 mg/dL (ref 0.0–149.0)
VLDL: 29.4 mg/dL (ref 0.0–40.0)
WBC: 4.6 10*3/uL (ref 4.5–10.5)

## 2009-11-02 ENCOUNTER — Telehealth: Payer: Self-pay | Admitting: Internal Medicine

## 2009-11-07 ENCOUNTER — Ambulatory Visit: Payer: Self-pay | Admitting: Internal Medicine

## 2009-11-07 DIAGNOSIS — J0100 Acute maxillary sinusitis, unspecified: Secondary | ICD-10-CM | POA: Insufficient documentation

## 2009-11-07 LAB — CONVERTED CEMR LAB
Cholesterol, target level: 200 mg/dL
HDL goal, serum: 40 mg/dL
LDL Goal: 70 mg/dL

## 2010-01-21 ENCOUNTER — Ambulatory Visit: Payer: Self-pay | Admitting: Internal Medicine

## 2010-01-21 LAB — CONVERTED CEMR LAB
ALT: 12 units/L (ref 0–35)
AST: 18 units/L (ref 0–37)
Albumin: 3.9 g/dL (ref 3.5–5.2)
Alkaline Phosphatase: 110 units/L (ref 39–117)
BUN: 18 mg/dL (ref 6–23)
Bilirubin, Direct: 0.1 mg/dL (ref 0.0–0.3)
CO2: 30 meq/L (ref 19–32)
Calcium: 9.8 mg/dL (ref 8.4–10.5)
Chloride: 104 meq/L (ref 96–112)
Cholesterol: 167 mg/dL (ref 0–200)
Creatinine, Ser: 0.8 mg/dL (ref 0.4–1.2)
GFR calc non Af Amer: 73.94 mL/min (ref >60.00)
Glucose, Bld: 92 mg/dL (ref 70–99)
HDL: 42.8 mg/dL (ref >39.00)
LDL Cholesterol: 91 mg/dL (ref 0–99)
Potassium: 4.4 meq/L (ref 3.5–5.1)
Sodium: 143 meq/L (ref 135–145)
Total Bilirubin: 0.6 mg/dL (ref 0.3–1.2)
Total CHOL/HDL Ratio: 4
Total Protein: 6.5 g/dL (ref 6.0–8.3)
Triglycerides: 164 mg/dL — ABNORMAL HIGH (ref 0.0–149.0)
VLDL: 32.8 mg/dL (ref 0.0–40.0)

## 2010-02-07 ENCOUNTER — Ambulatory Visit
Admission: RE | Admit: 2010-02-07 | Discharge: 2010-02-07 | Payer: Self-pay | Source: Home / Self Care | Attending: Internal Medicine | Admitting: Internal Medicine

## 2010-03-05 NOTE — Progress Notes (Signed)
Summary: ultrqasound results   Phone Note Call from Patient Call back at Home Phone [phone]   Caller: Patient Call For: jenkins Reason for Call: Insurance Question Summary of Call: would like results of ultrasound Initial call taken by: Roselle Locus,  December 18, 2006 9:48 AM  Follow-up for Phone Call        talked with pt and told her ok and may be a hernia(per dr Lovell Sheehan) .  can watch or send to surgeon, Pt will wait till next ov in 3 months ..................................................................Marland KitchenWilly Eddy, LPN  December 18, 2006 9:59 AM

## 2010-03-05 NOTE — Progress Notes (Signed)
Summary: RF STRIPS  Medications Added FREESTYLE LITE   STRP (GLUCOSE BLOOD) once daily       Phone Note Call from Patient Call back at Home Phone [phone]   Caller: Patient-LIVE CALL Call For: DR Lennon Alstrom Reason for Call: Privacy/Consent Authorization Summary of Call: WOULD LIKE NEW RX FOR DIABETIC TEST [facility]IPS [facility] [address] Initial call taken by: Warnell Forester,  December 30, 2006 9:40 AM    New/Updated Medications: FREESTYLE LITE   STRP (GLUCOSE BLOOD) once daily   Prescriptions: FREESTYLE LITE   STRP (GLUCOSE BLOOD) once daily  #100 x 6   Entered by:   Willy Eddy, LPN   Authorized by:   Stacie Glaze MD   Signed by:   Willy Eddy, LPN on 91/47/8295   Method used:   [facility]ronically [facility] ...       [facility]  [address]  [id number]       [address]       [place], [place]  [address]       Ph: [phone] or [phone]       Fax: [phone]   RxID:   [id number]     Appended Document: RF STRIPS patient called again this am

## 2010-03-05 NOTE — Progress Notes (Signed)
Summary: LMTCB with name of pharmacy 10/27/2007  Phone Note Call from Patient Call back at [phone]   Caller: vm Call For: jenkins Summary of Call: Saw someone else last week.  To take OTC meds mucinex dm & oxycodone.  Chest  so congested & coughing up a storm.  No fever any more.  8 days now.    See or call in meds please.  Desperate now.   Initial call taken by: Rudy Jew, RN,  October 27, 2007 11:41 AM  Follow-up for Phone Call        per dr Lovell Sheehan z pack and atuss 6 oz 1 every 12 hours Follow-up by: Willy Eddy, LPN,  October 27, 2007 12:59 PM  Additional Follow-up for Phone Call Additional follow up Details #1::        [facility] with name of pharmacy. Additional Follow-up by: Lynann Beaver CMA,  October 27, 2007 1:15 PM    New/Updated Medications: ATUSS DS 30-4-30 MG/5ML SUSP (PSEUDOEPHED HCL-CPM-DM HBR TAN) one teaspoon q 12 hours as needed cough. ZITHROMAX Z-PAK 250 MG TABS (AZITHROMYCIN) as directed   Prescriptions: ZITHROMAX Z-PAK 250 MG TABS (AZITHROMYCIN) as directed  #6 x 0   Entered by:   Lynann Beaver CMA   Authorized by:   Stacie Glaze MD   Signed by:   Lynann Beaver CMA on 10/27/2007   Method used:   [facility]        [facility]  [address]  [id number]       [address]       [place], [place]  [address]       Ph: [phone] or [phone]       Fax: [phone]   RxID:   [id number] ATUSS DS 30-4-30 MG/5ML SUSP (PSEUDOEPHED HCL-CPM-DM HBR TAN) one teaspoon q 12 hours as needed cough.  #6 oz. x 0   Entered by:   Lynann Beaver CMA   Authorized by:   Stacie Glaze MD   Signed by:   Lynann Beaver CMA on 10/27/2007   Method used:   [facility]        [facility]  [address]  [id number]       [address]       [place], [place]  [address]       Ph: [phone] or [phone]       Fax: [phone]   RxID:   [id number]  Left message on voice mail to  pick [facility] prescriptions at [facility])

## 2010-03-05 NOTE — Assessment & Plan Note (Signed)
Summary: fever,cough,congestion sore throat/mhf   Vital Signs:  Patient Profile:   65 Years Old Female Height:     67 inches Weight:      210 pounds Temp:     100.2 degrees F oral Pulse rate:   113 / minute BP sitting:   160 / 120  (right arm)  Vitals Entered By: Jerilynn Mages (October 19, 2007 2:37 PM)                 Chief Complaint:  fever, cough, congestion, sore throat, and headache.  History of Present Illness: 65 year old patient was a less than 24-hour history of fever, chills, sore throat, cough.  Cough is largely nonproductive.  Husband has been similarly ill    Past Medical History:    Reviewed history from 03/15/2007 and no changes required:       Hyperlipidemia       Coronary artery disease       Hypertension       Osteoarthritis       Depression       back pain with L5 disc       Osteoporosis       Headache      Physical Exam  General:     overweight-appearing.  blood pressure 164/90, pulse rate 110-frequent paroxysms of coughing Head:     Normocephalic and atraumatic without obvious abnormalities. No apparent alopecia or balding. Eyes:     No corneal or conjunctival inflammation noted. EOMI. Perrla. Funduscopic exam benign, without hemorrhages, exudates or papilledema. Vision grossly normal. Ears:     External ear exam shows no significant lesions or deformities.  Otoscopic examination reveals clear canals, tympanic membranes are intact bilaterally without bulging, retraction, inflammation or discharge. Hearing is grossly normal bilaterally. Mouth:     pharyngeal erythema.   Neck:     No deformities, masses, or tenderness noted. Lungs:     Normal respiratory effort, chest expands symmetrically. Lungs are clear to auscultation, no crackles or wheezes. Heart:     Normal rate and regular rhythm. S1 and S2 normal without gallop, murmur, click, rub or other extra sounds.  tachycardia Abdomen:     Bowel sounds positive,abdomen soft and non-tender  without masses, organomegaly or hernias noted. Extremities:     no edema    Impression & Recommendations:  Problem # 1:  URI (ICD-465.9)  Problem # 2:  HYPERTENSION (ICD-401.9)  Her updated medication list for this problem includes:    Toprol Xl 50 Mg Tb24 (Metoprolol succinate) .Marland Kitchen..Marland Kitchen Two times a day    Benicar Hct 40-25 Mg Tabs (Olmesartan medoxomil-hctz) ..... Once daily   Complete Medication List: 1)  Klor-con 20 Meq Pack (Potassium chloride) .... Once daily 2)  Biotin 1000 Mcg Tabs (Biotin) .... 2 every day 3)  Plavix 75 Mg Tabs (Clopidogrel bisulfate) .... Once daily 4)  Imdur 60 Mg Tb24 (Isosorbide mononitrate) .... Once daily 5)  Toprol Xl 50 Mg Tb24 (Metoprolol succinate) .... Two times a day 6)  Benicar Hct 40-25 Mg Tabs (Olmesartan medoxomil-hctz) .... Once daily 7)  Vicodin 5-500 Mg Tabs (Hydrocodone-acetaminophen) .... As needed 8)  Alprazolam 0.25 Mg Tabs (Alprazolam) .... As needed 9)  Crestor 20 Mg Tabs (Rosuvastatin calcium) .... Once daily 10)  Nitrolingual 0.4 Mg/spray Tl Soln (Nitroglycerin) .... Use as directed 11)  Freestyle Lite Strp (Glucose blood) .... Once daily 12)  Vitamin D 61443 Unit Caps (Ergocalciferol) .Marland Kitchen.. 1  po every week   Patient Instructions: 1)  Drink  as much fluid as you can tolerate for the next few days. 2)  use Vicodin every 4 to 6 hours for cough and fever control 3)  Mucinex DM use every 12 hours   Prescriptions: VICODIN 5-500 MG  TABS (HYDROCODONE-ACETAMINOPHEN) as needed  #50 x 3   Entered and Authorized by:   Gordy Savers  MD   Signed by:   Gordy Savers  MD on 10/19/2007   Method used:   Print then Give to Patient   RxID:   [id number]  ]

## 2010-03-05 NOTE — Letter (Signed)
Summary: [facility] Instructions  [facility]  [address] [place], [place] [address]   Phone: [phone]  Fax: [phone]       Tricia [name]    Jun 20, 1945    MRN: [id number]        Procedure Day /Date: Thursday 08/09/2009     Arrival Time: 7:30 am      Procedure Time: 8:30 am     Location of Procedure:                    _x _  [facility] (4th Floor)                        PREPARATION FOR COLONOSCOPY WITH MOVIPREP   Starting 5 days prior to your procedure 08/04/2009 do not eat nuts, seeds, popcorn, corn, beans, peas,  salads, or any raw vegetables.  Do not take any fiber supplements (e.g. Metamucil, Citrucel, and Benefiber).  THE DAY BEFORE YOUR PROCEDURE         DATE:  08/08/2009  DAY:  Wednesday  1.  Drink clear liquids the entire day-NO SOLID FOOD  2.  Do not drink anything colored red or purple.  Avoid juices with pulp.  No orange juice.  3.  Drink at least 64 oz. (8 glasses) of fluid/clear liquids during the day to prevent dehydration and help the prep work efficiently.  CLEAR LIQUIDS INCLUDE: Water Jello Ice Popsicles Tea (sugar ok, no milk/cream) Powdered fruit flavored drinks Coffee (sugar ok, no milk/cream) Gatorade Juice: apple, white grape, white cranberry  Lemonade Clear bullion, consomm, broth Carbonated beverages (any kind) Strained chicken noodle soup Hard Candy                             4.  In the morning, mix first dose of MoviPrep solution:    Empty 1 Pouch A and 1 Pouch B into the disposable container    Add lukewarm drinking water to the top line of the container. Mix to dissolve    Refrigerate (mixed solution should be used within 24 hrs)  5.  Begin drinking the prep at 5:00 p.m. The MoviPrep container is divided by 4 marks.   Every 15 minutes drink the solution down to the next mark (approximately 8 oz) until the full liter is complete.   6.  Follow completed prep with 16 oz of clear liquid of your choice  (Nothing red or purple).  Continue to drink clear liquids until bedtime.  7.  Before going to bed, mix second dose of MoviPrep solution:    Empty 1 Pouch A and 1 Pouch B into the disposable container    Add lukewarm drinking water to the top line of the container. Mix to dissolve    Refrigerate  THE DAY OF YOUR PROCEDURE      DATE:   08/09/2009  DAY:  Thursday  Beginning at   3:30 a.m. (5 hours before procedure):         1. Every 15 minutes, drink the solution down to the next mark (approx 8 oz) until the full liter is complete.  2. Follow completed prep with 16 oz. of clear liquid of your choice.    3. You may drink clear liquids until  6:30 am (2 HOURS BEFORE PROCEDURE).   MEDICATION INSTRUCTIONS  Unless otherwise instructed, you should take regular prescription medications with a small sip of  water   as early as possible the morning of your procedure.    Additional medication instructions: Hold Benicar HCT the morning of procedure.         OTHER INSTRUCTIONS  You will need a responsible adult at least 63 years of age to accompany you and drive you home.   This person must remain in the waiting room during your procedure.  Wear loose fitting clothing that is easily removed.  Leave jewelry and other valuables at home.  However, you may wish to bring a book to read or  an iPod/MP3 player to listen to music as you wait for your procedure to start.  Remove all body piercing jewelry and leave at home.  Total time from sign-in until discharge is approximately 2-3 hours.  You should go home directly after your procedure and rest.  You can resume normal activities the  day after your procedure.  The day of your procedure you should not:   Drive   Make legal decisions   Operate machinery   Drink alcohol   Return to work  You will receive specific instructions about eating, activities and medications before you leave.    The above instructions have been  reviewed and explained to me by  Wyona Almas RN  July 26, 2009 9:08 AM     I fully understand and can verbalize these instructions _____________________________ Date _________

## 2010-03-05 NOTE — Miscellaneous (Signed)
Summary: Lec previsit  Clinical Lists Changes  Medications: Added new medication of MOVIPREP 100 GM  SOLR (PEG-KCL-NACL-NASULF-NA ASC-C) As per prep instructions. - Signed Rx of MOVIPREP 100 GM  SOLR (PEG-KCL-NACL-NASULF-NA ASC-C) As per prep instructions.;  #1 x 0;  Signed;  Entered by: Wyona Almas RN;  Authorized by: Hilarie Fredrickson MD;  Method used: Electronically to [facility] [address], [place], [place]  [address], Ph: [phone], Fax: [phone] Allergies: Added new allergy or adverse reaction of SULFA Observations: Added new observation of NKA: F (07/26/2009 8:22)    Prescriptions: MOVIPREP 100 GM  SOLR (PEG-KCL-NACL-NASULF-NA ASC-C) As per prep instructions.  #1 x 0   Entered by:   Wyona Almas RN   Authorized by:   Hilarie Fredrickson MD   Signed by:   Wyona Almas RN on 07/26/2009   Method used:   Electronically to        [facility] [id number]* (retail)       [address]       [place], [place]  [address]       Ph: [phone]       Fax: [phone]   RxID:   [id number]

## 2010-03-05 NOTE — Procedures (Signed)
Summary: Colonoscopy Report/[facility]  Colonoscopy Report/[facility]   Imported By: Maryln Gottron 09/10/2009 10:08:39  _____________________________________________________________________  External Attachment:    Type:   Image     Comment:   External Document

## 2010-03-05 NOTE — Progress Notes (Signed)
Summary: Hosp Procedure   Phone Note Call from Patient Call back at Home Phone [phone]   Caller: Patient Call For: Dr Marina Goodell Reason for Call: Talk to Nurse Details for Reason: [facility] procedure Summary of Call: Pt wants her colonoscopy at the hospital because she believes she will not incur any charges. Attempted to explain to pt that it is the same benefit regardless if done at [facility] vs [facility], pt is adamant that her procedure should be done at hospital. Initial call taken by: Dwan Bolt,  July 30, 2009 12:14 PM  Follow-up for Phone Call        Discussed with Dr.Sela Falk and he feels colon at hospital not necessary.Coding will be the same regardless of where procedure is done.-will be high risk screening due to hx. of polyps.When pt. calls back refer her to Clarnce Flock NW:GNFAOZ issues.Ashley in pre-cert. aware to direct pt's. further calls to [facility] per Dr.Lakira Ogando. Follow-up by: Teryl Lucy RN,  July 31, 2009 8:21 AM

## 2010-03-05 NOTE — Miscellaneous (Signed)
Summary: BONE DENSITY  Clinical Lists Changes  Orders: Added new Test order of T-Bone Densitometry (77080) - Signed Added new Test order of T-Lumbar Vertebral Assessment (77082) - Signed 

## 2010-03-05 NOTE — Progress Notes (Signed)
Summary: refill  Phone Note Refill Request Call back at Home Phone [phone] Call back at [phone] Message from:  Patient---live call  Refills Requested: Medication #1:  VICODIN 5-500 MG  TABS as needed   Brand Name Necessary? No [facility]  Initial call taken by: Warnell Forester,  August 17, 2009 10:18 AM  Follow-up for Phone Call        may refill x 1 Follow-up by: Stacie Glaze MD,  August 17, 2009 11:12 AM    Prescriptions: VICODIN 5-500 MG  TABS (HYDROCODONE-ACETAMINOPHEN) as needed  #50 x 0   Entered by:   Raechel Ache, RN   Authorized by:   Stacie Glaze MD   Signed by:   Raechel Ache, RN on 08/17/2009   Method used:   Printed then faxed to ...       [facility] [address] (retail)       [address]       [place], [place]  [address]       Ph: [phone]       Fax: [phone]   RxID:   [id number]

## 2010-03-05 NOTE — Letter (Signed)
Summary: Colonoscopy Letter  [facility]  [address] [place], [place] [address]   Phone: [phone]  Fax: [phone]      February 16, 2009 MRN: [id number]   Tricia [name] [address] [place], [place]  [address]   Dear Tricia [name],   According to your medical record, it is time for you to schedule a Colonoscopy. The American Cancer Society recommends this procedure as a method to detect early colon cancer. Patients with a family history of colon cancer, or a personal history of colon polyps or inflammatory bowel disease are at increased risk.  This letter has beeen generated based on the recommendations made at the time of your procedure. If you feel that in your particular situation this may no longer apply, please contact our office.  Please call our office at [phone] to schedule this appointment or to update your records at your earliest convenience.  Thank you for cooperating with [place] to provide you with the very best care possible.   Sincerely,  Wilhemina Bonito. Marina Goodell, M.D.  [facility] [phone]

## 2010-03-05 NOTE — Assessment & Plan Note (Signed)
Summary: CPX//VN  Medications Added VITAMIN D 1000 UNIT  TABS (CHOLECALCIFEROL) two by mouth daily        Vital Signs:  Patient Profile:   65 Years Old Female Height:     67 inches Weight:      212 pounds Temp:     98.5 degrees F oral Pulse rate:   76 / minute Resp:     14 per minute BP sitting:   140 / 80  (left arm)                 Chief Complaint:  cpx/had pap last year and has had partial hys/will be due next yeat/dt 2007/colonoscopy 2006/needs breast exam by dr Lovell Sheehan.  History of Present Illness: CPX  Hypertension History:      She denies headache, chest pain, palpitations, dyspnea with exertion, orthopnea, PND, peripheral edema, visual symptoms, neurologic problems, syncope, and side effects from treatment.  She notes no problems with any antihypertensive medication side effects.        Positive major cardiovascular risk factors include female age 61 years old or older, hyperlipidemia, and hypertension.  Negative major cardiovascular risk factors include non-tobacco-user status.        Positive history for target organ damage include ASHD (either angina; prior MI; prior CABG).       Past Medical History:    Reviewed history from 09/22/2006 and no changes required:       Hyperlipidemia       Coronary artery disease       Hypertension       Osteoarthritis       Depression       back pain with L5 disc       Osteoporosis  Past Surgical History:    Reviewed history from 09/22/2006 and no changes required:       Hysterectomy       bariatric surgery       coronary angioplasty x 2  2002       stent placement       Appendectomy       Carpal tunnel release       Inguinal herniorrhaphy       Tonsillectomy       ulnar nerve release       Total knee replacement       Nephrectomy       Lumbar laminectomy L3-4       Cholecystectomy       Tubal ligation   Family History:    Reviewed history from 09/22/2006 and no changes required:       father        Family  History of CAD Female 1st degree relative <50  Social History:    Reviewed history from 09/22/2006 and no changes required:       Retired       Married       Former Smoker       Alcohol use-no       Drug use-no       Regular exercise-yes    Review of Systems  The patient denies anorexia, weight loss, decreased hearing, chest pain, syncope, dyspnea on exhertion, prolonged cough, hemoptysis, abdominal pain, melena, hematochezia, severe indigestion/heartburn, hematuria, and incontinence.     Physical Exam  General:     alert.   Head:     normocephalic and atraumatic.   Eyes:     pupils equal and pupils round.   Ears:  R ear normal and L ear normal.   Nose:     no external deformity.   Mouth:     pharynx pink and moist.   Neck:     No deformities, masses, or tenderness noted. Breasts:     no masses and no abnormal thickening.   Lungs:     normal respiratory effort, no dullness, and no wheezes.   Heart:     normal rate and regular rhythm.   Abdomen:     soft,soft, normal bowel sounds, RUQ tenderness, and RLQ mass.   Msk:     No deformity or scoliosis noted of thoracic or lumbar spine.   Pulses:     R and L carotid,radial,femoral,dorsalis pedis and posterior tibial pulses are full and equal bilaterally Extremities:     trace left pedal edema and trace right pedal edema.   Neurologic:     alert & oriented X3.   Skin:     Intact without suspicious lesions or rashes Cervical Nodes:     No lymphadenopathy noted Axillary Nodes:     No palpable lymphadenopathy Inguinal Nodes:     No significant adenopathy Psych:     Cognition and judgment appear intact. Alert and cooperative with normal attention span and concentration. No apparent delusions, illusions, hallucinations    Impression & Recommendations:  Problem # 1:  OSTEOPOROSIS (ICD-733.00) vitamin d and calcium when she remembers Her updated medication list for this problem includes:    Vitamin D 1000 Unit  Tabs (Cholecalciferol) .Marland Kitchen..Marland Kitchen Two by mouth daily increase D and add calcium  consistantly hold bone density until vit d levels are sustained for 6 months  Problem # 2:  PHYSICAL EXAMINATION (ICD-V70.0) got flu shot six weeks ago   Problem # 3:  DEPRESSION (ICD-311)  Her updated medication list for this problem includes:    Wellbutrin Sr 150 Mg Tb12 (Bupropion hcl) .Marland Kitchen..Marland Kitchen Two times a day    Alprazolam 0.25 Mg Tabs (Alprazolam) .Marland Kitchen... As needed Discussed treatment options, including trial of antidpressant medication. Will refer to behavioral health. Follow-up call in in 24-48 hours and recheck in 2 weeks, sooner as needed. Patient agrees to call if any worsening of symptoms or thoughts of doing harm arise. Verified that the patient has no suicidal ideation at this time.   Problem # 4:  OSTEOARTHRITIS (ICD-715.90)  Her updated medication list for this problem includes:    Vicodin 5-500 Mg Tabs (Hydrocodone-acetaminophen) .Marland Kitchen... As needed Discussed use of medications, application of heat or cold, and exercises.   Problem # 5:  HYPERLIPIDEMIA (ICD-272.4)  Her updated medication list for this problem includes:    Crestor 20 Mg Tabs (Rosuvastatin calcium) ..... Once daily  Labs Reviewed: Chol: 155 (12/07/2006)   HDL: 45.1 (12/07/2006)   LDL: 87 (12/07/2006)   TG: 113 (12/07/2006) SGOT: 22 (12/07/2006)   SGPT: 16 (12/07/2006)  10 Yr Risk Heart Disease: N/A   Problem # 6:  ABDOMINAL PAIN RIGHT LOWER QUADRANT (ICD-789.03) very tender in RLQ Orders: Radiology Referral (Radiology) Discussed symptom control with the patient.   Complete Medication List: 1)  Wellbutrin Sr 150 Mg Tb12 (Bupropion hcl) .... Two times a day 2)  Klor-con 20 Meq Pack (Potassium chloride) .... Once daily 3)  Biotin 1000 Mcg Tabs (Biotin) .... 2 every day 4)  Plavix 75 Mg Tabs (Clopidogrel bisulfate) .... Once daily 5)  Imdur 60 Mg Tb24 (Isosorbide mononitrate) .... Once daily 6)  Toprol Xl 50 Mg Tb24 (Metoprolol  succinate) .... Two times  a day 7)  Benicar Hct 40-25 Mg Tabs (Olmesartan medoxomil-hctz) .... Once daily 8)  Vicodin 5-500 Mg Tabs (Hydrocodone-acetaminophen) .... As needed 9)  Alprazolam 0.25 Mg Tabs (Alprazolam) .... As needed 10)  Crestor 20 Mg Tabs (Rosuvastatin calcium) .... Once daily 11)  Vitamin D 1000 Unit Tabs (Cholecalciferol) .... Two by mouth daily 12)  Nitrolingual 0.4 Mg/spray Tl Soln (Nitroglycerin) .... Use as directed  Hypertension Assessment/Plan:      The patient's hypertensive risk group is category C: Target organ damage and/or diabetes.  Today's blood pressure is 140/80.  Her blood pressure goal is < 140/90.   Patient Instructions: 1)  Please schedule a follow-up appointment in 3 months. 2)  take the extra vit d 3)  Vit D level prior to office visit 733.00    ]

## 2010-03-05 NOTE — Assessment & Plan Note (Signed)
Summary: Cardiology Nuclear Testing  Nuclear Med Background Indications for Stress Test: Evaluation for Ischemia   History: Angioplasty, Heart Catheterization, Myocardial Perfusion Study, Stents  History Comments: 02 PTCA/stent of LAD 04 Cath with nonobstructive disease 07 MPS-NL  Symptoms: Chest Pain, Light-Headedness, Nausea, Palpitations    Nuclear Pre-Procedure Cardiac Risk Factors: Family History - CAD, History of Smoking, Hypertension, NIDDM Caffeine/Decaff Intake: None NPO After: 8:00 PM Lungs: clear IV 0.9% NS with Angio Cath: 20g     IV Site: R Antecubital IV Started by: Irean Hong, RN Chest Size (in) 42     Cup Size C     Height (in): 67 Weight (lb): 189 BMI: 29.71 Tech Comments: Last metoprolol on 10/25/09 per patient.  Nuclear Med Study 1 or 2 day study:  1 day     Stress Test Type:  Treadmill/Lexiscan Reading MD:  Willa Rough, MD     Referring MD:  B. Crenshaw Resting Radionuclide:  Technetium 33m Tetrofosmin     Resting Radionuclide Dose:  11 mCi  Stress Radionuclide:  Technetium 23m Tetrofosmin     Stress Radionuclide Dose:  33 mCi   Stress Protocol      Max HR:  137 bpm     Predicted Max HR:  158 bpm  Max Systolic BP: 167 mm Hg     Percent Max HR:  86.71 %Rate Pressure Product:  40102    Stress Test Technologist:  Frederick Peers, EMT-P     Nuclear Technologist:  Domenic Polite, CNMT  Rest Procedure  Myocardial perfusion imaging was performed at rest 45 minutes following the intravenous administration of Technetium 75m Tetrofosmin.  Stress Procedure  The patient received IV Lexiscan 0.4 mg over 15-seconds with concurrent low level exercise and then Technetium 23m Tetrofosmin was injected at 30-seconds while the patient continued walking one more minute.  There were non specific st changes with Lexiscan.  Quantitative spect images were obtained after a 45 minute delay.  QPS Raw Data Images:  Normal; no motion artifact; normal heart/lung ratio. Stress  Images:  Normal homogeneous uptake in all areas of the myocardium. Rest Images:  Normal homogeneous uptake in all areas of the myocardium. Subtraction (SDS):  No evidence of ischemia. Transient Ischemic Dilatation:  .88  (Normal <1.22)  Lung/Heart Ratio:  .22  (Normal <0.45)  Quantitative Gated Spect Images QGS EDV:  77 ml QGS ESV:  15 ml QGS EF:  80 % QGS cine images:  Normal motion  Findings Normal nuclear study      Overall Impression  Exercise Capacity: Lexiscan with no exercise. BP Response: Normal blood pressure response. Clinical Symptoms: None ECG Impression: No significant ST segment change suggestive of ischemia. Overall Impression: Normal stress nuclear study.  Appended Document: Cardiology Nuclear Testing ok  Appended Document: Cardiology Nuclear Testing PT AWARE./CY

## 2010-03-05 NOTE — Progress Notes (Signed)
Summary: rx for new diabetes monitor   Phone Note Call from Patient Call back at Home Phone [phone]   Caller: patient (triage message) Call For: jenkins Summary of [facility] [address]  needs new rx for a diabetes monitor  Initial call taken by: Roselle Locus,  December 21, 2006 2:01 PM  Follow-up for Phone Call        free one from our office given to pt ..................................................................Marland KitchenWilly Eddy, LPN  December 21, 2006 2:33 PM

## 2010-03-05 NOTE — Assessment & Plan Note (Signed)
Vital Signs:  Patient Profile:   65 Years Old Female Height:     67 inches Weight:      212 pounds Temp:     98.2 degrees F oral Pulse rate:   80 / minute Resp:     14 per minute BP sitting:   160 / 96  (left arm)  Vitals Entered By: Willy Eddy, LPN (May 12, 452 10:08 AM)                 Chief Complaint:  roa vitamind d 09811 regimen-pt states bp up due to no meds this am-up late last night.  History of Present Illness: Current Problems:  we added vitamin d 50,000 international units and need recheck of levels as well as post agric surgery labd HEADACHE (ICD-784.0) ABDOMINAL PAIN RIGHT LOWER QUADRANT (ICD-789.03) OSTEOPOROSIS (ICD-733.00)  on vitamin D PHYSICAL EXAMINATION (ICD-V70.0) FAMILY HISTORY OF CAD FEMALE 1ST DEGREE RELATIVE <50 (ICD-V17.3) BACK PAIN (ICD-724.5) DEPRESSION (ICD-311) OSTEOARTHRITIS (ICD-715.90) HYPERTENSION (ICD-401.9)  stable ( readings at home) HYPERLIPIDEMIA (ICD-272.4) CORONARY ARTERY DISEASE (ICD-414.00)  stable  no chest pain    Hypertension History:      She denies headache, chest pain, palpitations, dyspnea with exertion, orthopnea, PND, peripheral edema, visual symptoms, neurologic problems, syncope, and side effects from treatment.  Further comments include: bettwe reading at home did not take medications today.        Positive major cardiovascular risk factors include female age 77 years old or older, hyperlipidemia, and hypertension.  Negative major cardiovascular risk factors include non-tobacco-user status.        Positive history for target organ damage include ASHD (either angina; prior MI; prior CABG).       Past Medical History:    Reviewed history from 03/15/2007 and no changes required:       Hyperlipidemia       Coronary artery disease       Hypertension       Osteoarthritis       Depression       back pain with L5 disc       Osteoporosis       Headache  Past Surgical History:    Reviewed history from  09/22/2006 and no changes required:       Hysterectomy       bariatric surgery       coronary angioplasty x 2  2002       stent placement       Appendectomy       Carpal tunnel release       Inguinal herniorrhaphy       Tonsillectomy       ulnar nerve release       Total knee replacement       Nephrectomy       Lumbar laminectomy L3-4       Cholecystectomy       Tubal ligation   Family History:    Reviewed history from 09/22/2006 and no changes required:       father        Family History of CAD Female 1st degree relative <50  Social History:    Reviewed history from 09/22/2006 and no changes required:       Retired       Married       Former Smoker       Alcohol use-no       Drug use-no  Regular exercise-yes    Review of Systems  The patient denies anorexia, fever, weight loss, weight gain, vision loss, decreased hearing, hoarseness, chest pain, syncope, dyspnea on exhertion, peripheral edema, prolonged cough, hemoptysis, abdominal pain, melena, hematochezia, severe indigestion/heartburn, hematuria, incontinence, genital sores, muscle weakness, suspicious skin lesions, transient blindness, difficulty walking, depression, unusual weight change, abnormal bleeding, enlarged lymph nodes, angioedema, and testicular masses.     Physical Exam  General:     alert.  overweight-appearing.   Head:     normocephalic and atraumatic.   Eyes:     pupils equal and pupils round.   Ears:     R ear normal and L ear normal.   Nose:     no external deformity.   Mouth:     pharynx pink and moist.   Neck:     No deformities, masses, or tenderness noted. Lungs:     normal respiratory effort, no dullness, and no wheezes.   Heart:     normal rate and regular rhythm.   Abdomen:     soft,soft, normal bowel sounds, RUQ tenderness, and RLQ mass.   Msk:     No deformity or scoliosis noted of thoracic or lumbar spine.   Pulses:     R and L carotid,radial,femoral,dorsalis pedis and  posterior tibial pulses are full and equal bilaterally Extremities:     trace left pedal edema and trace right pedal edema.   Neurologic:     alert & oriented X3.  sensation intact to light touch, sensation intact to pinprick, gait normal, and DTRs symmetrical and normal.      Impression & Recommendations:  Problem # 1:  OSTEOPOROSIS (ICD-733.00)  The following medications were removed from the medication list:    Vitamin D 1000 Unit Tabs (Cholecalciferol) .Marland Kitchen..Marland Kitchen Two by mouth daily  Her updated medication list for this problem includes:    Vitamin D 16109 Unit Caps (Ergocalciferol) .Marland Kitchen... 1  po every week  Orders: T-Vitamin D (25-Hydroxy) (60454-09811) TLB-Calcium (82310-CA) T-Bone Densitometry (91478)   Problem # 2:  HEADACHE (ICD-784.0)  Her updated medication list for this problem includes:    Toprol Xl 50 Mg Tb24 (Metoprolol succinate) .Marland Kitchen..Marland Kitchen Two times a day    Vicodin 5-500 Mg Tabs (Hydrocodone-acetaminophen) .Marland Kitchen... As needed Headache diary reviewed.   Problem # 3:  HYPERTENSION (ICD-401.9)  Her updated medication list for this problem includes:    Toprol Xl 50 Mg Tb24 (Metoprolol succinate) .Marland Kitchen..Marland Kitchen Two times a day    Benicar Hct 40-25 Mg Tabs (Olmesartan medoxomil-hctz) ..... Once daily  Orders: TLB-BMP (Basic Metabolic Panel-BMET) (80048-METABOL)  BP today: 160/96 Prior BP: 146/86 (03/15/2007)  Prior 10 Yr Risk Heart Disease: N/A (12/14/2006)  Labs Reviewed: Creat: 1.0 (03/15/2007) Chol: 155 (12/07/2006)   HDL: 45.1 (12/07/2006)   LDL: 87 (12/07/2006)   TG: 113 (12/07/2006)   Problem # 4:  CORONARY ARTERY DISEASE (ICD-414.00)  Her updated medication list for this problem includes:    Plavix 75 Mg Tabs (Clopidogrel bisulfate) ..... Once daily    Imdur 60 Mg Tb24 (Isosorbide mononitrate) ..... Once daily    Toprol Xl 50 Mg Tb24 (Metoprolol succinate) .Marland Kitchen..Marland Kitchen Two times a day    Benicar Hct 40-25 Mg Tabs (Olmesartan medoxomil-hctz) ..... Once daily    Nitrolingual  0.4 Mg/spray Tl Soln (Nitroglycerin) ..... Use as directed   Complete Medication List: 1)  Wellbutrin Sr 150 Mg Tb12 (Bupropion hcl) .... Two times a day 2)  Klor-con 20 Meq Pack (Potassium chloride) .... Once  daily 3)  Biotin 1000 Mcg Tabs (Biotin) .... 2 every day 4)  Plavix 75 Mg Tabs (Clopidogrel bisulfate) .... Once daily 5)  Imdur 60 Mg Tb24 (Isosorbide mononitrate) .... Once daily 6)  Toprol Xl 50 Mg Tb24 (Metoprolol succinate) .... Two times a day 7)  Benicar Hct 40-25 Mg Tabs (Olmesartan medoxomil-hctz) .... Once daily 8)  Vicodin 5-500 Mg Tabs (Hydrocodone-acetaminophen) .... As needed 9)  Alprazolam 0.25 Mg Tabs (Alprazolam) .... As needed 10)  Crestor 20 Mg Tabs (Rosuvastatin calcium) .... Once daily 11)  Nitrolingual 0.4 Mg/spray Tl Soln (Nitroglycerin) .... Use as directed 12)  Freestyle Lite Strp (Glucose blood) .... Once daily 13)  Vitamin D 11914 Unit Caps (Ergocalciferol) .Marland Kitchen.. 1  po every week  Other Orders: TLB-CBC Platelet - w/Differential (85025-CBCD) TLB-IBC Pnl(Iron/FE;Transferrin) (83550-IBC)  Hypertension Assessment/Plan:      The patient's hypertensive risk group is category C: Target organ damage and/or diabetes.  Today's blood pressure is 160/96.  Her blood pressure goal is < 140/90.   Patient Instructions: 1)  Please schedule a follow-up appointment in 6 months. 2)  bone density prior to six mouth visit    Prescriptions: VITAMIN D 78295 UNIT  CAPS (ERGOCALCIFEROL) 1  po every week  #5 x 11   Entered and Authorized by:   Stacie Glaze MD   Signed by:   Stacie Glaze MD on 06/07/2007   Method used:   [facility] to ...       [facility]  [address]  [id number]       [address]       [place], [place]  [address]       Ph: [phone] or [phone]       Fax: [phone]   RxID:   [id number]  ]

## 2010-03-05 NOTE — Progress Notes (Signed)
Summary: Vit D levels are low   Phone Note Outgoing Call   Call placed by: Lynann Beaver CMA,  December 14, 2006 11:59 AM Summary of Call: called pt to give her Vit D levels, but she is scheduled today for a CPX, and prefers to discuss this with Dr. Lovell Sheehan. Initial call taken by: Lynann Beaver CMA,  December 14, 2006 11:59 AM

## 2010-03-05 NOTE — Progress Notes (Signed)
Summary: nuc pre procedure  Phone Note Outgoing Call Call back at Home Phone [phone]   Call placed by: Cathlyn Parsons RN,  October 29, 2009 3:47 PM Call placed to: Patient Reason for Call: Confirm/change Appt Summary of Call: Reviewed information on [employer] Information Sheet (see scanned document for further details).  Spoke with patient.      Nuclear Med Background Indications for Stress Test: Evaluation for Ischemia   History: Angioplasty, Heart Catheterization, Myocardial Perfusion Study, Stents  History Comments: 02 PTCA/stent of LAD 04 Cath with nonobstructive disease 07 MPS-NL  Symptoms: Chest Pain    Nuclear Pre-Procedure Cardiac Risk Factors: Family History - CAD, History of Smoking, Hypertension, NIDDM Height (in): 67

## 2010-03-05 NOTE — Letter (Signed)
Summary: [facility]  [facility]   Imported By: Sherian Rein 04/09/2009 11:57:07  _____________________________________________________________________  External Attachment:    Type:   Image     Comment:   External Document

## 2010-03-05 NOTE — Progress Notes (Signed)
Summary: REQ FOR REFILL RX (IMDUR)  Phone Note Call from Patient   Caller: Patient    [phone] Reason for Call: Refill Medication Summary of Call: Pt called to req a refill on med: Imdur (isosorbide mononitrate) 60 mg.... Pt adv Rx can be sent to [facility] on [address] / [address].  Pt can be reached at [phone] with any questions or concerns.  Initial call taken by: Debbra Riding,  May 17, 2009 9:30 AM    New/Updated Medications: IMDUR 60 MG XR24H-TAB (ISOSORBIDE MONONITRATE) 1 once daily Prescriptions: IMDUR 60 MG XR24H-TAB (ISOSORBIDE MONONITRATE) 1 once daily  #30 x 6   Entered by:   Willy Eddy, LPN   Authorized by:   Stacie Glaze MD   Signed by:   Willy Eddy, LPN on 44/04/4740   Method used:   Electronically to        [facility] [address] (retail)       [address]       [place], [place]  [address]       Ph: [phone]       Fax: [phone]   RxID:   [id number]

## 2010-03-05 NOTE — Progress Notes (Signed)
Summary: WANTS RESULTS  Phone Note Call from Patient Call back at Home Phone [phone]   Caller: Patient-live call Reason for Call: Lab or Test Results Complaint: Breathing Problems, Urinary/GYN Problems Summary of Call: REQUESTING HER BONE DENSITY RESULTS. PLEASE RETURN HER CALL. Initial call taken by: Warnell Forester,  Jun 26, 2008 8:46 AM  Follow-up for Phone Call        info given to pt as to what dr Lovell Sheehan said- basically no change Follow-up by: Willy Eddy, LPN,  July 05, 979 4:28 PM

## 2010-03-05 NOTE — Assessment & Plan Note (Signed)
Summary: CPX // RS   Vital Signs:  Patient profile:   65 year old female Height:      67 inches Weight:      191 pounds BMI:     30.02 Temp:     97.6 degrees F oral Pulse rate:   64 / minute Resp:     14 per minute BP sitting:   134 / 80  (left arm)  Vitals Entered By: Willy Eddy, LPN (May 09, 452 2:16 PM) CC: cpx- dr Jens Som is cardiologist   CC:  cpx- dr Jens Som is cardiologist.  History of Present Illness: The pt was asked about all immunizations, health maint. services that are appropriate to their age and was given guidance on diet exercize  and weight management   Diabetes Management History:      She says that she is exercising.    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) 5)  Uri  (ICD-465.9) 6)  Headache  (ICD-784.0) 7)  Abdominal Pain Right Lower Quadrant  (ICD-789.03) 8)  Osteoporosis  (ICD-733.00) 9)  Physical Examination  (ICD-V70.0) 10)  Family History of Cad Female 1st Degree Relative <50  (ICD-V17.3) 11)  Back Pain  (ICD-724.5) 12)  Depression  (ICD-311) 13)  Osteoarthritis  (ICD-715.90) 14)  Hypertension  (ICD-401.9) 15)  Hyperlipidemia  (ICD-272.4) 16)  Coronary Artery Disease  (ICD-414.00)  Current Problems (verified): 1)  Anemia, Vitamin B12 Deficiency  (ICD-281.1) 2)  Uti  (ICD-599.0) 3)  Leg Cramps  (ICD-729.82) 4)  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 Once Daily 2)  Vicodin 5-500 Mg  Tabs (Hydrocodone-Acetaminophen) .... As Needed 3)  Alprazolam 0.25 Mg  Tabs (Alprazolam) .... As Needed 4)  Crestor 20 Mg  Tabs (Rosuvastatin Calcium) .... One By Mouth Mwf 5)  Nitrolingual 0.4 Mg/spray Tl Soln (Nitroglycerin) .... Use As Directed 6)  Freestyle Test  Strp (Glucose Blood) .Marland Kitchen.. 1 Once Daily 7)  Metoprolol Tartrate 50 Mg Tabs (Metoprolol Tartrate) .... One By Mouth  Two Times A Day 8)  Betamethasone Dipropionate 0.05 % Lotn (Betamethasone Dipropionate) .... Mix 1/4 Betamethasone With 3/4 Sarna Lotion and Apply Two Times A Day To Dry Inching Skin 9)  Aspir-Low 81 Mg Tbec (Aspirin) .... One By Mouth Daily 10)  Cipro 500 Mg Tabs (Ciprofloxacin Hcl) .Marland Kitchen.. 1 Two Times A Day 11)  Freestyle Lancets  Misc (Lancets) .Marland Kitchen.. 1 Once Daily 12)  Zyrtec Hives Relief 10 Mg Tabs (Cetirizine Hcl) .... As Directed  Current Medications (verified): 1)  Benicar Hct 40-25 Mg  Tabs (Olmesartan Medoxomil-Hctz) .... Once Daily 2)  Vicodin 5-500 Mg  Tabs (Hydrocodone-Acetaminophen) .... As Needed 3)  Alprazolam 0.25 Mg  Tabs (Alprazolam) .... As Needed 4)  Crestor 20 Mg  Tabs (Rosuvastatin Calcium) .Marland KitchenMarland KitchenMarland Kitchen  One By Mouth Mwf 5)  Nitrolingual 0.4 Mg/spray Tl Soln (Nitroglycerin) .... Use As Directed 6)  Freestyle Test  Strp (Glucose Blood) .Marland Kitchen.. 1 Once Daily 7)  Metoprolol Tartrate 50 Mg Tabs (Metoprolol Tartrate) .... One By Mouth  Two Times A Day 8)  Aspir-Low 81 Mg Tbec (Aspirin) .... One By Mouth Daily 9)  Freestyle Lancets  Misc (Lancets) .Marland Kitchen.. 1 Once Daily 10)  Zyrtec Hives Relief 10 Mg Tabs (Cetirizine Hcl) .... As Directed  Allergies (verified): No Known Drug Allergies  Past History:  Family History: Last updated: 09/22/2006 father  Family History of CAD Female 1st degree relative <50  Social  History: Last updated: 09/22/2006 Retired Married Former Smoker Alcohol use-no Drug use-no Regular exercise-yes  Risk Factors: Exercise: yes (09/22/2006)  Risk Factors: Smoking Status: quit (05/09/2009)  Past medical, surgical, family and social histories (including risk factors) reviewed, and no changes noted (except as noted below).  Past Medical History: Reviewed history from 03/15/2007 and no changes required. Hyperlipidemia Coronary artery disease Hypertension Osteoarthritis Depression back pain with L5 disc Osteoporosis Headache  Past Surgical History: Reviewed history from 09/22/2006 and no changes required. Hysterectomy bariatric surgery coronary angioplasty x 2  2002 stent placement Appendectomy Carpal tunnel release Inguinal herniorrhaphy Tonsillectomy ulnar nerve release Total knee replacement Nephrectomy Lumbar laminectomy L3-4 Cholecystectomy Tubal ligation  Family History: Reviewed history from 09/22/2006 and no changes required. father  Family History of CAD Female 1st degree relative <50  Social History: Reviewed history from 09/22/2006 and no changes required. Retired Married Former Smoker Alcohol use-no Drug use-no Regular exercise-yes  Review of Systems  The patient denies anorexia, fever, weight loss, weight gain, vision loss, decreased hearing, hoarseness, chest pain, syncope, dyspnea on exertion, peripheral edema, prolonged cough, headaches, hemoptysis, abdominal pain, melena, hematochezia, severe indigestion/heartburn, hematuria, incontinence, genital sores, muscle weakness, suspicious skin lesions, transient blindness, difficulty walking, depression, unusual weight change, abnormal bleeding, enlarged lymph nodes, angioedema, and breast masses.    Physical Exam  General:  alert and well-nourished.   Head:  normocephalic and no abnormalities palpated.   Eyes:  pupils equal and pupils round.   Ears:  R ear normal and L ear normal.    Nose:  no external deformity and no nasal discharge.   Mouth:  no gingival abnormalities and pharynx pink and moist.   Neck:  No deformities, masses, or tenderness noted. Lungs:  normal respiratory effort and no wheezes.   Heart:  normal rate, regular rhythm, and no rub.   Abdomen:  Bowel sounds positive,abdomen soft and non-tender without masses, organomegaly or hernias noted. Msk:  no joint tenderness and no joint swelling.  TKR on right Pulses:  R and L carotid,radial,femoral,dorsalis pedis and posterior tibial pulses are full and equal bilaterally Extremities:  trace left pedal edema and 1+ right pedal edema.   Neurologic:  No cranial nerve deficits noted. Station and gait are normal. Plantar reflexes are down-going bilaterally. DTRs are symmetrical throughout. Sensory, motor and coordinative functions appear intact. Skin:  Intact without suspicious lesions or rashes Cervical Nodes:  No lymphadenopathy noted Axillary Nodes:  No palpable lymphadenopathy   Impression & Recommendations:  Problem # 1:  PHYSICAL EXAMINATION (ICD-V70.0)  The pt was asked about all immunizations, health maint. services that are appropriate to their age and was given guidance on diet exercize  and weight management  Mammogram: normal (12/13/2005) Pap smear: normal (12/14/2006) Colonoscopy: abnormal (02/07/2004) Td Booster: Td (02/03/2005)   Flu Vax: Fluvax 3+ (11/19/2007)   Pneumovax: Pneumovax (  02/04/2003) Chol: 195 (05/02/2009)   HDL: 57.20 (05/02/2009)   LDL: 114 (05/02/2009)   TG: 121.0 (05/02/2009) TSH: 2.87 (05/02/2009)   HgbA1C: 6.2 (05/02/2009)    Discussed using sunscreen, use of alcohol, drug use, self breast exam, routine dental care, routine eye care, schedule for GYN exam, routine physical exam, seat belts, multiple vitamins, osteoporosis prevention, adequate calcium intake in diet, recommendations for immunizations, mammograms and Pap smears.  Discussed exercise and checking cholesterol.   Discussed gun safety, safe sex, and contraception.  Complete Medication List: 1)  Benicar Hct 40-25 Mg Tabs (Olmesartan medoxomil-hctz) .... Once daily 2)  Vicodin 5-500 Mg Tabs (Hydrocodone-acetaminophen) .... As needed 3)  Alprazolam 0.25 Mg Tabs (Alprazolam) .... As needed 4)  Crestor 20 Mg Tabs (Rosuvastatin calcium) .... One by mouth mwf 5)  Nitrolingual 0.4 Mg/spray Tl Soln (Nitroglycerin) .... Use as directed 6)  Freestyle Test Strp (Glucose blood) .Marland Kitchen.. 1 once daily 7)  Metoprolol Tartrate 50 Mg Tabs (Metoprolol tartrate) .... One by mouth  two times a day 8)  Aspir-low 81 Mg Tbec (Aspirin) .... One by mouth daily 9)  Freestyle Lancets Misc (Lancets) .Marland Kitchen.. 1 once daily 10)  Zyrtec Hives Relief 10 Mg Tabs (Cetirizine hcl) .... As directed  Diabetes Management Assessment/Plan:      Her blood pressure goal is < 140/90.    Patient Instructions: 1)  bone density due march 2012 2)  Please schedule a follow-up appointment in 6 months. Prescriptions: METOPROLOL TARTRATE 50 MG TABS (METOPROLOL TARTRATE) one by mouth  two times a day  #180 x 3   Entered and Authorized by:   Stacie Glaze MD   Signed by:   Stacie Glaze MD on 05/09/2009   Method used:   Electronically to        [facility] # [phone]* (retail)       [address]       [place], [place]  [address]       Ph: [phone]       Fax: [phone]   RxID:   [id number]    Preventive Care Screening  Pap Smear:    Next Due:  12/2009    Contraindications/Deferment of Procedures/Staging:    Test/Procedure: FLU VAX    Reason for deferment: patient declined    Prevention & Chronic Care Immunizations   Influenza vaccine: Fluvax 3+  (11/19/2007)   Influenza vaccine deferral: patient declined  (05/09/2009)   Influenza vaccine due: 10/04/2009    Tetanus booster: 02/03/2005: Td   Tetanus booster due: 02/04/2015    Pneumococcal vaccine: Pneumovax  (02/04/2003)   Pneumococcal vaccine due:  09/02/2012    H. zoster vaccine: Not documented  Colorectal Screening   Hemoccult: Not documented    Colonoscopy: abnormal  (02/07/2004)  Other Screening   Pap smear: normal  (12/14/2006)   Pap smear due: 12/2009    Mammogram: normal  (12/13/2005)    DXA bone density scan: Not documented   Smoking status: quit  (05/09/2009)  Lipids   Total Cholesterol: 195  (05/02/2009)   LDL: 114  (05/02/2009)   LDL Direct: 160.9  (03/28/2008)   HDL: 57.20  (05/02/2009)   Triglycerides: 121.0  (05/02/2009)    SGOT (AST): 19  (05/02/2009)   SGPT (ALT): 16  (05/02/2009)   Alkaline phosphatase: 102  (05/02/2009)   Total bilirubin: 0.3  (05/02/2009)  Hypertension   Last Blood Pressure: 134 / 80  (05/09/2009)   Serum creatinine: 0.9  (05/02/2009)   Serum potassium  4.8  (05/02/2009)  Self-Management Support :    Hypertension self-management support: Not documented    Lipid self-management support: Not documented

## 2010-03-05 NOTE — Procedures (Signed)
Summary: Prep papers/[facility]  Prep papers/[facility]   Imported By: Lester [place] 08/13/2009 09:23:24  _____________________________________________________________________  External Attachment:    Type:   Image     Comment:   External Document

## 2010-03-05 NOTE — Progress Notes (Signed)
Summary: additional labwork needed  Phone Note Call from Patient Call back at [phone]   Caller: Patient Call For: dr Lovell Sheehan Complaint: Urinary/GYN Problems Summary of Call: pt is sch for cpx labs on 03-28-08 pt is req additional labs due to gastric bypass ?protein labs etc she stated we should have a copy of labs that is needed. can i sch and please list test Initial call taken by: Heron Sabins,  February 16, 2008 9:14 AM  Follow-up for Phone Call        may h ave b 12 level,iron level,vit d level and microalbumin- use code fopr gastric btpass Follow-up by: Willy Eddy, LPN,  February 16, 2008 10:48 AM  Additional Follow-up for Phone Call Additional follow up Details #1::        i do not know code for gastric bypass lab are added to cpx labs Additional Follow-up by: Heron Sabins,  February 16, 2008 10:56 AM    Additional Follow-up for Phone Call Additional follow up Details #2::    the code for those labs is v45.86 Follow-up by: Willy Eddy, LPN,  February 16, 2008 12:33 PM  Additional Follow-up for Phone Call Additional follow up Details #3:: Details for Additional Follow-up Action Taken: ok thanks Additional Follow-up by: Heron Sabins,  February 16, 2008 12:40 PM

## 2010-03-05 NOTE — Letter (Signed)
Summary: Patient Notice- Polyp Results  [facility]  [address] [place], [place] [address]   Phone: [phone]  Fax: [phone]        August 28, 2009 MRN: [id number]    LATIFAH PADIN [address] [place], [place]  [address]    Dear Ms. CARAWAN,  I am pleased to inform you that the colon polyp(s) removed during your recent colonoscopy was (were) found to be benign (no cancer detected) upon pathologic examination.  I recommend you have a repeat colonoscopy examination in 5 years to look for recurrent polyps, as having colon polyps increases your risk for having recurrent polyps or even colon cancer in the future.  Should you develop new or worsening symptoms of abdominal pain, bowel habit changes or bleeding from the rectum or bowels, please schedule an evaluation with either your primary care physician or with me.  Additional information/recommendations:  __ No further action with gastroenterology is needed at this time. Please      follow-up with your primary care physician for your other healthcare      needs.   Please call us if you are having persistent problems or have questions about your condition that have not been fully answered at this time.  Sincerely,  Hilarie Fredrickson MD  This letter has been electronically signed by your physician.  Appended Document: Patient Notice- Polyp Results Letter mailed to pt.  Appended Document: Orders Update     Clinical Lists Changes        Diabetes Management History:      She says that she is exercising.    Diabetes Management Exam:    Eye Exam:       Eye Exam done elsewhere          Date: 08/29/2009          Results: normal          Done by: [facility]  Diabetes Management Assessment/Plan:      Her blood pressure goal is < 140/90.

## 2010-03-05 NOTE — Progress Notes (Signed)
Summary: Pt req med for sinus inf. Pls call in to [facility]  Phone Note Call from Patient Call back at Home Phone [phone] Call back at [phone]    call either number   Caller: Patient Summary of Call: Pt called and said that she has sinus inf. Nose burning and headache. Pt req med called in to [facility]. Pt was offered appt with another dr but refused.  Initial call taken by: Lucy Antigua,  November 02, 2009 9:11 AM  Follow-up for Phone Call        per dr Lovell Sheehan may have a z pack and get allegra d otc Follow-up by: Willy Eddy, LPN,  November 02, 2009 9:15 AM    New/Updated Medications: ZITHROMAX Z-PAK 250 MG TABS (AZITHROMYCIN) as directed ALLEGRA-D ALLERGY & CONGESTION 60-120 MG XR12H-TAB (FEXOFENADINE-PSEUDOEPHEDRINE) as directed Prescriptions: ALLEGRA-D ALLERGY & CONGESTION 60-120 MG XR12H-TAB (FEXOFENADINE-PSEUDOEPHEDRINE) as directed  #30 x prn   Entered by:   Lynann Beaver CMA   Authorized by:   Stacie Glaze MD   Signed by:   Lynann Beaver CMA on 11/02/2009   Method used:   Electronically to        [facility] # [id number]* (retail)       [address]       [place], [place]  [address]       Ph: [phone]       Fax: [phone]   RxID:   [id number] ZITHROMAX Z-PAK 250 MG TABS (AZITHROMYCIN) as directed  #6 x 0   Entered by:   Lynann Beaver CMA   Authorized by:   Stacie Glaze MD   Signed by:   Lynann Beaver CMA on 11/02/2009   Method used:   Electronically to        [facility] # [id number]* (retail)       [address]       [place], [place]  [address]       Ph: [phone]       Fax: [phone]   RxID:   [id number]

## 2010-03-05 NOTE — Assessment & Plan Note (Signed)
Summary: 6 month rov/njr   Vital Signs:  Patient profile:   65 year old female Height:      67 inches Weight:      186 pounds BMI:     29.24 Temp:     98.2 degrees F oral Pulse rate:   60 / minute Resp:     14 per minute BP sitting:   130 / 76  (left arm)  Vitals Entered By: Willy Eddy, LPN (November 07, 2009 1:38 PM) CC: roa labs-flu vaccine not given due to 99 temp and uri sx, Lipid Management Is Patient Diabetic? No   Primary Care Provider:  Dr. Darryll Capers  CC:  roa labs-flu vaccine not given due to 99 temp and uri sx and Lipid Management.  History of Present Illness: he pt was on a cruize and ate "butter" all the time she was eating and drinking the prior levels were much better and this was a big change she admits also to "missing a night  or two  Lipid Management History:      Positive NCEP/ATP III risk factors include female age 95 years old or older, diabetes, hypertension, and ASHD (either angina/prior MI/prior CABG).  Negative NCEP/ATP III risk factors include non-tobacco-user status.    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 Once Daily 2)  Vicodin 5-500 Mg  Tabs (Hydrocodone-Acetaminophen) .... As Needed 3)  Alprazolam 0.25 Mg  Tabs (Alprazolam) .... As Needed 4)  Crestor 20 Mg  Tabs (Rosuvastatin Calcium) .... One By Mouth Mwf 5)  Nitrolingual 0.4 Mg/spray Tl Soln (Nitroglycerin) .... Use As Directed 6)  Freestyle Test  Strp (Glucose Blood) .Marland Kitchen.. 1 Once Daily 7)  Metoprolol Tartrate 50 Mg Tabs (Metoprolol Tartrate) .... One By Mouth  Two Times A Day 8)  Aspir-Low 81 Mg Tbec (Aspirin) .... One By Mouth Daily 9)  Freestyle Lancets  Misc (Lancets) .Marland Kitchen.. 1 Once Daily 10)  Zyrtec Hives Relief 10 Mg Tabs (Cetirizine Hcl) .... As Directed 11)  Imdur 60 Mg Xr24h-Tab (Isosorbide Mononitrate) .... Once Daily 12)  Daily Vitamins For Women  Tabs (Multiple Vitamins-Calcium) .... Once Daily 13)  Zithromax Z-Pak 250 Mg Tabs (Azithromycin) .... As Directed 14)  Allegra-D Allergy & Congestion 60-120 Mg Xr12h-Tab (Fexofenadine-Pseudoephedrine) .... As Directed  Allergies (verified): 1)  ! Sulfa 2)  ! Septra  Past History:  Family History: Last updated: 10/05/2009 Family History of CAD Female 1st degree relative <50  Social History: Last updated: 10/05/2009 Retired Married Tobacco abuse Alcohol  use-no Drug use-no Regular exercise-yes  Risk Factors: Exercise: yes (09/22/2006)  Risk Factors: Smoking Status: quit (11/07/2009)  Past medical, surgical, family and social histories (including risk factors) reviewed, and no changes noted (except as noted below).  Past Medical History: Reviewed history from 10/05/2009 and no changes required. CORONARY ARTERY DISEASE HYPERTENSION HYPERLIPIDEMIA  OBESITY  ANXIETY  SLEEP APNEA DM  DERMATOPHYTOSIS OF GROIN AND PERIANAL AREA  ANEMIA, VITAMIN B12 DEFICIENCY  OSTEOPOROSIS  DEPRESSION  OSTEOARTHRITIS  back pain with  L5 disc Diverticulosis  Past Surgical History: Reviewed history from 10/05/2009 and no changes required. Hysterectomy bariatric surgery coronary angioplasty x 2  2002 Appendectomy Carpal tunnel release Inguinal herniorrhaphy Tonsillectomy ulnar nerve release Total knee replacement Nephrectomy Lumbar laminectomy L3-4 Cholecystectomy Tubal ligation  Family History: Reviewed history from 10/05/2009 and no changes required. Family History of CAD Female 1st degree relative <50  Social History: Reviewed history from 10/05/2009 and no changes required. Retired Married Tobacco abuse Alcohol use-no Drug use-no Regular exercise-yes   Impression & Recommendations:  Problem # 1:  ACUTE MAXILLARY SINUSITIS (ICD-461.0) Assessment Deteriorated new infections Her updated medication list for this problem includes:    Zithromax Z-pak 250 Mg Tabs (Azithromycin) .Marland Kitchen... As directed    Allegra-d Allergy & Congestion 60-120 Mg Xr12h-tab (Fexofenadine-pseudoephedrine) .Marland Kitchen... As directed    Atuss Ds 30-4-30 Mg/57ml Susp (Pseudoephed hcl-cpm-dm hbr tan) .Marland Kitchen..Marland Kitchen Two tsp by mouth two times a day  Instructed on treatment. Call if symptoms persist or worsen.   Problem # 2:  HYPERTENSION (ICD-401.9)  Her updated medication list for this problem includes:    Benicar Hct 40-25 Mg Tabs (Olmesartan medoxomil-hctz) ..... Once daily    Metoprolol Tartrate 50 Mg Tabs (Metoprolol tartrate) ..... One by mouth  two times a day  BP today: 130/76 Prior BP: 102/60 (10/05/2009)  Prior 10 Yr Risk Heart Disease: N/A (12/14/2006)  Labs Reviewed: K+: 4.8 (05/02/2009) Creat: : 0.9 (05/02/2009)   Chol: 256 (10/30/2009)   HDL: 44.90 (10/30/2009)   LDL: 114 (05/02/2009)   TG: 147.0 (10/30/2009)  Problem # 3:  HYPERLIPIDEMIA (ICD-272.4)  Her updated medication list for this problem includes:    Crestor 20 Mg Tabs (Rosuvastatin calcium) ..... One by mouth mwf  Labs Reviewed: SGOT: 19 (10/30/2009)   SGPT: 11  (10/30/2009)  Prior 10 Yr Risk Heart Disease: N/A (12/14/2006)   HDL:44.90 (10/30/2009), 57.20 (05/02/2009)  LDL:114 (05/02/2009), DEL (03/28/2008)  Chol:256 (10/30/2009), 195 (05/02/2009)  Trig:147.0 (10/30/2009), 121.0 (05/02/2009)  Complete Medication List: 1)  Benicar Hct 40-25 Mg Tabs (Olmesartan medoxomil-hctz) .... Once daily 2)  Vicodin 5-500 Mg Tabs (Hydrocodone-acetaminophen) .... As needed 3)  Alprazolam 0.25 Mg Tabs (Alprazolam) .... As needed 4)  Crestor 20 Mg Tabs (Rosuvastatin calcium) .... One by mouth mwf 5)  Nitrolingual 0.4 Mg/spray Tl Soln (Nitroglycerin) .... Use as directed 6)  Freestyle Test Strp (Glucose blood) .Marland Kitchen.. 1 once daily 7)  Metoprolol Tartrate 50 Mg Tabs (Metoprolol tartrate) .... One by mouth  two times a day 8)  Aspir-low 81 Mg Tbec (Aspirin) .... One by mouth daily 9)  Freestyle Lancets Misc (Lancets) .Marland Kitchen.. 1 once daily 10)  Zyrtec Hives Relief 10 Mg Tabs (Cetirizine hcl) .... As directed 11)  Imdur 60 Mg Xr24h-tab (Isosorbide mononitrate) .... Once daily 12)  Daily Vitamins For Women Tabs (Multiple vitamins-calcium) .... Once daily 13)  Zithromax Z-pak 250 Mg Tabs (Azithromycin) .... As directed 14)  Allegra-d Allergy & Congestion 60-120 Mg Xr12h-tab (Fexofenadine-pseudoephedrine) .... As directed 15)  Atuss Ds 30-4-30 Mg/33ml  Susp (Pseudoephed hcl-cpm-dm hbr tan) .... Two tsp by mouth two times a day  Lipid Assessment/Plan:      Based on NCEP/ATP III, the patient's risk factor category is "history of coronary disease, peripheral vascular disease, cerebrovascular disease, or aortic aneurysm along with either diabetes, current smoker, or LDL > 130 plus HDL < 40 plus triglycerides > 200".  The patient's lipid goals are as follows: Total cholesterol goal is 200; LDL cholesterol goal is 70; HDL cholesterol goal is 40; Triglyceride goal is 150.  Her LDL cholesterol goal has not been met.     Patient Instructions: 1)  Please schedule a follow-up appointment in  3 months. 2)  BMP prior to visit, ICD-9:401.9 3)  Hepatic Panel prior to visit, ICD-9:995.20 4)  Lipid Panel prior to visit, ICD-9:272.4

## 2010-03-05 NOTE — Progress Notes (Signed)
Summary: rx refill  Phone Note Call from Patient Call back at [phone]   Caller: pt live Call For: Stacie Glaze MD Summary of Call: Patient needs rx refill for imdur 60mb, nitroglycerin  spray.  [facility]. Initial call taken by: Celine Ahr,  April 18, 2008 10:17 AM      Prescriptions: NITROLINGUAL 0.4 MG/SPRAY TL SOLN (NITROGLYCERIN) use as directed  #1 x 3   Entered by:   Willy Eddy, LPN   Authorized by:   Stacie Glaze MD   Signed by:   Willy Eddy, LPN on 14/78/2956   Method used:   Electronically to        [facility] [phone]* (retail)       [address] [place], [place]  [address]       Ph: [phone]       Fax: [phone]   RxID:   [id number] IMDUR 60 MG  TB24 (ISOSORBIDE MONONITRATE) once daily  #30 x 6   Entered by:   Willy Eddy, LPN   Authorized by:   Stacie Glaze MD   Signed by:   Willy Eddy, LPN on 56/38/7564   Method used:   Electronically to        [facility] [phone]* (retail)       [address] [place], [place]  [address]       Ph: [phone]       Fax: [phone]   RxID:   [id number]   Appended Document: rx refill done

## 2010-03-05 NOTE — Assessment & Plan Note (Signed)
Summary: rash//ccm   Vital Signs:  Patient profile:   65 year old female Height:      67 inches Weight:      190 pounds BMI:     29.87 Temp:     98.2 degrees F oral Pulse rate:   80 / minute Resp:     14 per minute BP sitting:   144 / 80  (left arm)  Vitals Entered By: Willy Eddy, LPN (August 23, 2009 8:45 AM) CC: heat rash under folds of abd and groin area- c/o dysuria   CC:  heat rash under folds of abd and groin area- c/o dysuria.  History of Present Illness: In the heat she feels difficulty breathing with the heas and the humidity when the temp is cool she can walk 4 miles she has occasional anginal pains that respond to NTG some days she might use the NTG spray multiple times a day she will be referred to cardiology ad we will consider long acting nitrate  puritic rash in goin ( fungal vs heat)  Preventive Screening-Counseling & Management  Alcohol-Tobacco     Smoking Status: quit  Allergies: 1)  ! Sulfa  Past History:  Family History: Last updated: 09/22/2006 father  Family History of CAD Female 1st degree relative <50  Social History: Last updated: 09/22/2006 Retired Married Former Smoker Alcohol use-no Drug use-no Regular exercise-yes  Risk Factors: Exercise: yes (09/22/2006)  Risk Factors: Smoking Status: quit (08/23/2009)  Past medical, surgical, family and social histories (including risk factors) reviewed, and no changes noted (except as noted below).  Past Medical History: Reviewed history from 03/15/2007 and no changes required. Hyperlipidemia Coronary artery disease Hypertension Osteoarthritis Depression back pain with L5 disc Osteoporosis Headache  Past Surgical History: Reviewed history from 09/22/2006 and no changes required. Hysterectomy bariatric surgery coronary angioplasty x 2  2002 stent placement Appendectomy Carpal tunnel release Inguinal herniorrhaphy Tonsillectomy ulnar nerve release Total knee  replacement Nephrectomy Lumbar laminectomy L3-4 Cholecystectomy Tubal ligation  Family History: Reviewed history from 09/22/2006 and no changes required. father  Family History of CAD Female 1st degree relative <50  Social History: Reviewed history from 09/22/2006 and no changes required. Retired Married Former Smoker Alcohol use-no Drug use-no Regular exercise-yes   Impression & Recommendations:  Problem # 1:  DERMATOPHYTOSIS OF GROIN AND PERIANAL AREA (ICD-110.3) Assessment New lotrisome lotions to site BID  Problem # 2:  HYPERTENSION (ICD-401.9) room to ad the increased nitrate Her updated medication list for this problem includes:    Benicar Hct 40-25 Mg Tabs (Olmesartan medoxomil-hctz) ..... Once daily    Metoprolol Tartrate 50 Mg Tabs (Metoprolol tartrate) ..... One by mouth  two times a day  BP today: 144/80 recheck 135/80 Prior BP: 134/80 (05/09/2009)  Prior 10 Yr Risk Heart Disease: N/A (12/14/2006)  Labs Reviewed: K+: 4.8 (05/02/2009) Creat: : 0.9 (05/02/2009)   Chol: 195 (05/02/2009)   HDL: 57.20 (05/02/2009)   LDL: 114 (05/02/2009)   TG: 121.0 (05/02/2009)  Problem # 3:  CORONARY ARTERY DISEASE (ICD-414.00) changing the imdur due to increased use of NITROSPRAY Her updated medication list for this problem includes:    Benicar Hct 40-25 Mg Tabs (Olmesartan medoxomil-hctz) ..... Once daily    Nitrolingual 0.4 Mg/spray Tl Soln (Nitroglycerin) ..... Use as directed    Metoprolol Tartrate 50 Mg Tabs (Metoprolol tartrate) ..... One by mouth  two times a day    Aspir-low 81 Mg Tbec (Aspirin) ..... One by mouth daily    Imdur 120 Mg  Xr24h-tab (Isosorbide mononitrate) ..... One by mouth daily  Orders: Cardiology Referral (Cardiology)  Labs Reviewed: Chol: 195 (05/02/2009)   HDL: 57.20 (05/02/2009)   LDL: 114 (05/02/2009)   TG: 121.0 (05/02/2009)  Complete Medication List: 1)  Benicar Hct 40-25 Mg Tabs (Olmesartan medoxomil-hctz) .... Once daily 2)  Vicodin  5-500 Mg Tabs (Hydrocodone-acetaminophen) .... As needed 3)  Alprazolam 0.25 Mg Tabs (Alprazolam) .... As needed 4)  Crestor 20 Mg Tabs (Rosuvastatin calcium) .... One by mouth mwf 5)  Nitrolingual 0.4 Mg/spray Tl Soln (Nitroglycerin) .... Use as directed 6)  Freestyle Test Strp (Glucose blood) .Marland Kitchen.. 1 once daily 7)  Metoprolol Tartrate 50 Mg Tabs (Metoprolol tartrate) .... One by mouth  two times a day 8)  Aspir-low 81 Mg Tbec (Aspirin) .... One by mouth daily 9)  Freestyle Lancets Misc (Lancets) .Marland Kitchen.. 1 once daily 10)  Zyrtec Hives Relief 10 Mg Tabs (Cetirizine hcl) .... As directed 11)  Imdur 120 Mg Xr24h-tab (Isosorbide mononitrate) .... One by mouth daily 12)  Moviprep 100 Gm Solr (Peg-kcl-nacl-nasulf-na asc-c) .... As per prep instructions. 13)  Clotrimazole-betamethasone 1-0.05 % Lotn (Clotrimazole-betamethasone) .... Apply to site two times a day  Other Orders: UA Dipstick w/Micro (automated) (81001)  Patient Instructions: 1)  Please schedule a follow-up appointment in 3 months. 2)  Hepatic Panel prior to visit, ICD-9:995.20 3)  Lipid Panel prior to visit, ICD-9:272.4 4)  TSH prior to visit, ICD-9:272.4 5)  CBC w/ Diff prior to visit, ICD-9:995.20 Prescriptions: CLOTRIMAZOLE-BETAMETHASONE 1-0.05 % LOTN (CLOTRIMAZOLE-BETAMETHASONE) apply to site two times a day  #60cc x 3   Entered and Authorized by:   Stacie Glaze MD   Signed by:   Stacie Glaze MD on 08/23/2009   Method used:   Electronically to        [facility] # [id number]* (retail)       [address]       [place], [place]  [address]       Ph: [phone]       Fax: [phone]   RxID:   [id number] IMDUR 120 MG XR24H-TAB (ISOSORBIDE MONONITRATE) one by mouth daily  #30 x 11   Entered and Authorized by:   Stacie Glaze MD   Signed by:   Stacie Glaze MD on 08/23/2009   Method used:   Electronically to        [facility] # [id number]* (retail)       [address]        [place], [place]  [address]       Ph: [phone]       Fax: [phone]   RxID:   [id number] NITROLINGUAL 0.4 MG/SPRAY TL SOLN (NITROGLYCERIN) use as directed  #1 x 11   Entered by:   Willy Eddy, LPN   Authorized by:   Stacie Glaze MD   Signed by:   Willy Eddy, LPN on 30/16/0109   Method used:   Print then Give to Patient   RxID:   [id number] ALPRAZOLAM 0.25 MG  TABS (ALPRAZOLAM) as needed  #30 x 1   Entered by:   Willy Eddy, LPN   Authorized by:   Stacie Glaze MD   Signed by:   Willy Eddy, LPN on 62/37/6283   Method used:   Print then Give to Patient   RxID:   [id number]   Laboratory Results   Urine Tests    Routine Urinalysis   Color: yellow  Appearance: Clear Glucose: negative   (Normal Range: Negative) Bilirubin: negative   (Normal Range: Negative) Ketone: negative   (Normal Range: Negative) Spec. Gravity: 1.025   (Normal Range: 1.003-1.035) Blood: negative   (Normal Range: Negative) pH: 5.0   (Normal Range: 5.0-8.0) Protein: negative   (Normal Range: Negative) Urobilinogen: 0.2   (Normal Range: 0-1) Nitrite: negative   (Normal Range: Negative) Leukocyte Esterace: negative   (Normal Range: Negative)    Comments: Rita Ohara  August 23, 2009 8:50 AM

## 2010-03-05 NOTE — Assessment & Plan Note (Signed)
Summary: 3 month ov/nta    Vital Signs:  Patient Profile:   65 Years Old Female Height:     67 inches Weight:      212 pounds Temp:     97.8 degrees F oral Pulse rate:   80 / minute Resp:     14 per minute BP sitting:   146 / 86  (left arm)  Vitals Entered By: Willy Eddy, LPN (March 15, 2007 2:02 PM)                 Chief Complaint:  roa/didnt have lab work as scheduled.  History of Present Illness: gets tension head ache about once a week in the frontal area. Current Problems:  ABDOMINAL PAIN RIGHT LOWER QUADRANT (ICD-789.03) had a trasvanginal [place] was negative for masses or cyst. Pain will awaken from sleep.  OSTEOPOROSIS (ICD-733.00) FAMILY HISTORY OF CAD FEMALE 1ST DEGREE RELATIVE <50 (ICD-V17.3) BACK PAIN (ICD-724.5) DEPRESSION (ICD-311)  stable OSTEOARTHRITIS (ICD-715.90)stable HYPERTENSION (ICD-401.9)controlled HYPERLIPIDEMIA (ICD-272.4)  did not ge lipid due to eating CORONARY ARTERY DISEASE (ICD-414.00)    Hypertension History:      She denies headache, chest pain, palpitations, dyspnea with exertion, orthopnea, PND, peripheral edema, visual symptoms, neurologic problems, syncope, and side effects from treatment.  Further comments include: has a head ache.        Positive major cardiovascular risk factors include female age 1 years old or older, hyperlipidemia, and hypertension.  Negative major cardiovascular risk factors include non-tobacco-user status.        Positive history for target organ damage include ASHD (either angina; prior MI; prior CABG).       Past Medical History:    Reviewed history from 12/14/2006 and no changes required:       Hyperlipidemia       Coronary artery disease       Hypertension       Osteoarthritis       Depression       back pain with L5 disc       Osteoporosis       Headache  Past Surgical History:    Reviewed history from 09/22/2006 and no changes required:       Hysterectomy       bariatric surgery    coronary angioplasty x 2  2002       stent placement       Appendectomy       Carpal tunnel release       Inguinal herniorrhaphy       Tonsillectomy       ulnar nerve release       Total knee replacement       Nephrectomy       Lumbar laminectomy L3-4       Cholecystectomy       Tubal ligation   Family History:    Reviewed history from 09/22/2006 and no changes required:       father        Family History of CAD Female 1st degree relative <50  Social History:    Reviewed history from 09/22/2006 and no changes required:       Retired       Married       Former Smoker       Alcohol use-no       Drug use-no       Regular exercise-yes   Risk Factors:  PAP Smear History:     Date of  Last PAP Smear:  12/14/2006    Results:  normal   Mammogram History:     Date of Last Mammogram:  12/13/2005    Results:  normal    Review of Systems  The patient denies anorexia, fever, weight loss, vision loss, decreased hearing, hoarseness, chest pain, syncope, dyspnea on exhertion, peripheral edema, prolonged cough, hemoptysis, abdominal pain, melena, hematochezia, severe indigestion/heartburn, hematuria, incontinence, genital sores, muscle weakness, suspicious skin lesions, transient blindness, difficulty walking, depression, unusual weight change, abnormal bleeding, enlarged lymph nodes, angioedema, and breast masses.         weekly Head ache   Physical Exam  General:     alert.   Head:     normocephalic and atraumatic.   Eyes:     pupils equal and pupils round.   Nose:     no external deformity.   Mouth:     pharynx pink and moist.   Neck:     No deformities, masses, or tenderness noted. Lungs:     normal respiratory effort, no dullness, and no wheezes.   Abdomen:     soft,soft, normal bowel sounds, RUQ tenderness, and RLQ mass.   Msk:     No deformity or scoliosis noted of thoracic or lumbar spine.   Neurologic:     alert & oriented X3.  sensation intact to light  touch, sensation intact to pinprick, gait normal, and DTRs symmetrical and normal.      Impression & Recommendations:  Problem # 1:  HEADACHE (ICD-784.0) with visual loss for 20 sec , weekly head with no visual loss. Pattern is stable Her updated medication list for this problem includes:    Toprol Xl 50 Mg Tb24 (Metoprolol succinate) .Marland Kitchen..Marland Kitchen Two times a day    Vicodin 5-500 Mg Tabs (Hydrocodone-acetaminophen) .Marland Kitchen... As needed Headache diary reviewed.  no increased   Problem # 2:  ABDOMINAL PAIN RIGHT LOWER QUADRANT (ICD-789.03)  Orders: Radiology Referral (Radiology) Discussed symptom control with the patient.   Problem # 3:  OSTEOPOROSIS (ICD-733.00)  Her updated medication list for this problem includes:    Vitamin D 1000 Unit Tabs (Cholecalciferol) .Marland Kitchen..Marland Kitchen Two by mouth daily  Orders: TLB-Calcium (82310-CA) T-Vitamin D (25-Hydroxy) ([address]-60454) Venipuncture (09811)   Complete Medication List: 1)  Wellbutrin Sr 150 Mg Tb12 (Bupropion hcl) .... Two times a day 2)  Klor-con 20 Meq Pack (Potassium chloride) .... Once daily 3)  Biotin 1000 Mcg Tabs (Biotin) .... 2 every day 4)  Plavix 75 Mg Tabs (Clopidogrel bisulfate) .... Once daily 5)  Imdur 60 Mg Tb24 (Isosorbide mononitrate) .... Once daily 6)  Toprol Xl 50 Mg Tb24 (Metoprolol succinate) .... Two times a day 7)  Benicar Hct 40-25 Mg Tabs (Olmesartan medoxomil-hctz) .... Once daily 8)  Vicodin 5-500 Mg Tabs (Hydrocodone-acetaminophen) .... As needed 9)  Alprazolam 0.25 Mg Tabs (Alprazolam) .... As needed 10)  Crestor 20 Mg Tabs (Rosuvastatin calcium) .... Once daily 11)  Vitamin D 1000 Unit Tabs (Cholecalciferol) .... Two by mouth daily 12)  Nitrolingual 0.4 Mg/spray Tl Soln (Nitroglycerin) .... Use as directed 13)  Freestyle Lite Strp (Glucose blood) .... Once daily  Other Orders: TLB-BMP (Basic Metabolic Panel-BMET) (80048-METABOL)  Hypertension Assessment/Plan:      The patient's hypertensive risk group is category  C: Target organ damage and/or diabetes.  Today's blood pressure is 146/86.  Her blood pressure goal is < 140/90.   Patient Instructions: 1)  Please schedule a follow-up appointment in 2 months.    ]  Preventive  Care Screening  Pap Smear:    Date:  12/14/2006    Results:  normal  Mammogram:    Date:  12/13/2005    Results:  normal

## 2010-03-05 NOTE — Assessment & Plan Note (Signed)
Summary: FLU-SHOT/RCD   Impression & Recommendations: Flu Vaccine Consent Questions     Do you have a history of severe allergic reactions to this vaccine? no    Any prior history of allergic reactions to egg and/or gelatin? no    Do you have a sensitivity to the preservative Thimersol? no    Do you have a past history of Guillan-Barre Syndrome? no    Do you currently have an acute febrile illness? no    Have you ever had a severe reaction to latex? no    Vaccine information given and explained to patient? yes    Are you currently pregnant? no    Lot Number:[id number]   Site Given  Left Deltoid IM   Complete Medication List: 1)  Klor-con 20 Meq Pack (Potassium chloride) .... Once daily 2)  Biotin 1000 Mcg Tabs (Biotin) .... 2 every day 3)  Plavix 75 Mg Tabs (Clopidogrel bisulfate) .... Once daily 4)  Imdur 60 Mg Tb24 (Isosorbide mononitrate) .... Once daily 5)  Toprol Xl 50 Mg Tb24 (Metoprolol succinate) .... Two times a day 6)  Benicar Hct 40-25 Mg Tabs (Olmesartan medoxomil-hctz) .... Once daily 7)  Vicodin 5-500 Mg Tabs (Hydrocodone-acetaminophen) .... As needed 8)  Alprazolam 0.25 Mg Tabs (Alprazolam) .... As needed 9)  Crestor 20 Mg Tabs (Rosuvastatin calcium) .... Once daily 10)  Nitrolingual 0.4 Mg/spray Tl Soln (Nitroglycerin) .... Use as directed 11)  Freestyle Lite Strp (Glucose blood) .... Once daily 12)  Vitamin D [address] Unit Caps (Ergocalciferol) .Marland Kitchen.. 1  po every week 13)  Atuss Ds 30-4-30 Mg/55ml Susp (Pseudoephed hcl-cpm-dm hbr tan) .... One teaspoon q 12 hours as needed cough. 14)  Zithromax Z-pak 250 Mg Tabs (Azithromycin) .... As directed   Nurse Visit    Prior Medications: KLOR-CON 20 MEQ  PACK (POTASSIUM CHLORIDE) once daily BIOTIN 1000 MCG  TABS (BIOTIN) 2 every day PLAVIX 75 MG  TABS (CLOPIDOGREL BISULFATE) once daily IMDUR 60 MG  TB24 (ISOSORBIDE MONONITRATE) once daily TOPROL XL 50 MG  TB24 (METOPROLOL SUCCINATE) two times a day BENICAR HCT 40-25 MG   TABS (OLMESARTAN MEDOXOMIL-HCTZ) once daily VICODIN 5-500 MG  TABS (HYDROCODONE-ACETAMINOPHEN) as needed ALPRAZOLAM 0.25 MG  TABS (ALPRAZOLAM) as needed CRESTOR 20 MG  TABS (ROSUVASTATIN CALCIUM) once daily NITROLINGUAL 0.4 MG/SPRAY TL SOLN (NITROGLYCERIN) use as directed FREESTYLE LITE   STRP (GLUCOSE BLOOD) once daily VITAMIN D 60454 UNIT  CAPS (ERGOCALCIFEROL) 1  po every week ATUSS DS 30-4-30 MG/5ML SUSP (PSEUDOEPHED HCL-CPM-DM HBR TAN) one teaspoon q 12 hours as needed cough. ZITHROMAX Z-PAK 250 MG TABS (AZITHROMYCIN) as directed     Orders Added: 1)  Flu Vaccine 11yrs + [09811] 2)  Administration Flu vaccine [G0008]    ]

## 2010-03-05 NOTE — Progress Notes (Signed)
   Phone Note Outgoing Call   Summary of Call: Per Dr.Perry colon to be cx. here and scheduled at [facility] due to her insurance paying a 100% there. Initial call taken by: Teryl Lucy RN,  August 02, 2009 4:59 PM  Follow-up for Phone Call        Colon changed to hosp. on 08/27/09.Pt. re-instructed. Follow-up by: Teryl Lucy RN,  August 03, 2009 9:19 AM

## 2010-03-05 NOTE — Letter (Signed)
Summary: [facility]  [facility]   Imported By: Sherian Rein 07/14/2009 09:00:55  _____________________________________________________________________  External Attachment:    Type:   Image     Comment:   External Document

## 2010-03-05 NOTE — Assessment & Plan Note (Signed)
Summary: cpx/njr   Vital Signs:  Patient Profile:   65 Years Old Female Height:     67 inches Weight:      201 pounds Temp:     98.4 degrees F oral Pulse rate:   80 / minute Resp:     14 per minute BP sitting:   180 / 100  (left arm)  Vitals Entered By: Willy Eddy, LPN (April 04, 1608 2:19 PM)                 Chief Complaint:  Annual visit for disease management--pt states she is not taking [place] medication except benicar at times because of family circumstances.  History of Present Illness: Not taking medication and has lost health insurance Blood pressure is up due to not taking meds Smoking again??? Sister died from NASH  CPX    Prior Medication List:  KLOR-CON 20 MEQ  PACK (POTASSIUM CHLORIDE) once daily BIOTIN 1000 MCG  TABS (BIOTIN) 2 every day PLAVIX 75 MG  TABS (CLOPIDOGREL BISULFATE) once daily IMDUR 60 MG  TB24 (ISOSORBIDE MONONITRATE) once daily TOPROL XL 50 MG  TB24 (METOPROLOL SUCCINATE) two times a day BENICAR HCT 40-25 MG  TABS (OLMESARTAN MEDOXOMIL-HCTZ) once daily VICODIN 5-500 MG  TABS (HYDROCODONE-ACETAMINOPHEN) as needed ALPRAZOLAM 0.25 MG  TABS (ALPRAZOLAM) as needed CRESTOR 20 MG  TABS (ROSUVASTATIN CALCIUM) once daily NITROLINGUAL 0.4 MG/SPRAY TL SOLN (NITROGLYCERIN) use as directed FREESTYLE LITE   STRP (GLUCOSE BLOOD) once daily VITAMIN D 96045 UNIT  CAPS (ERGOCALCIFEROL) 1  po every week ATUSS DS 30-4-30 MG/5ML SUSP (PSEUDOEPHED HCL-CPM-DM HBR TAN) one teaspoon q 12 hours as needed cough. ZITHROMAX Z-PAK 250 MG TABS (AZITHROMYCIN) as directed   Current Allergies (reviewed today): No known allergies   Past Medical History:    Reviewed history from 03/15/2007 and no changes required:       Hyperlipidemia       Coronary artery disease       Hypertension       Osteoarthritis       Depression       back pain with L5 disc       Osteoporosis       Headache  Past Surgical History:    Reviewed history from 09/22/2006 and no  changes required:       Hysterectomy       bariatric surgery       coronary angioplasty x 2  2002       stent placement       Appendectomy       Carpal tunnel release       Inguinal herniorrhaphy       Tonsillectomy       ulnar nerve release       Total knee replacement       Nephrectomy       Lumbar laminectomy L3-4       Cholecystectomy       Tubal ligation   Family History:    Reviewed history from 09/22/2006 and no changes required:       father        Family History of CAD Female 1st degree relative <50  Social History:    Reviewed history from 09/22/2006 and no changes required:       Retired       Married       Former Smoker       Alcohol use-no       Drug use-no  Regular exercise-yes   Risk Factors: Tobacco use:  quit Drug use:  no Alcohol use:  no Exercise:  yes  Colonoscopy History:    Date of Last Colonoscopy:  02/07/2004  Mammogram History:    Date of Last Mammogram:  12/13/2005  PAP Smear History:    Date of Last PAP Smear:  12/14/2006   Review of Systems  The patient denies anorexia, fever, weight loss, weight gain, vision loss, decreased hearing, hoarseness, chest pain, syncope, dyspnea on exertion, peripheral edema, prolonged cough, headaches, hemoptysis, abdominal pain, melena, hematochezia, severe indigestion/heartburn, hematuria, incontinence, genital sores, muscle weakness, suspicious skin lesions, transient blindness, difficulty walking, depression, unusual weight change, abnormal bleeding, enlarged lymph nodes, angioedema, breast masses, and testicular masses.     Physical Exam  General:     overweight-appearing.  blood pressure 164/90, pulse rate 110-frequent paroxysms of coughing Head:     Normocephalic and atraumatic without obvious abnormalities. No apparent alopecia or balding. Eyes:     pupils equal and pupils reactive to light.   Ears:     R ear normal and L ear normal.   Nose:     no external deformity and no nasal  discharge.   Mouth:     pharyngeal erythema and edentulous.   Neck:     No deformities, masses, or tenderness noted. Chest Wall:     No deformities, masses, or tenderness noted. Lungs:     normal respiratory effort and no wheezes.   Heart:     normal rate, regular rhythm, and no rub.   Abdomen:     Bowel sounds positive,abdomen soft and non-tender without masses, organomegaly or hernias noted. Msk:     No deformity or scoliosis noted of thoracic or lumbar spine.   Pulses:     R and L carotid,radial,femoral,dorsalis pedis and posterior tibial pulses are full and equal bilaterally Extremities:     No clubbing, cyanosis, edema, or deformity noted with normal full range of motion of all joints.   Neurologic:     No cranial nerve deficits noted. Station and gait are normal. Plantar reflexes are down-going bilaterally. DTRs are symmetrical throughout. Sensory, motor and coordinative functions appear intact.    Impression & Recommendations:  Problem # 1:  PHYSICAL EXAMINATION (ICD-V70.0) the pt is status post bariatric surgery 2003 Sister had DM and NASH  Problem # 2:  OSTEOPOROSIS (ICD-733.00)  The following medications were removed from the medication list:    Vitamin D 47829 Unit Caps (Ergocalciferol) .Marland Kitchen... 1  po every week  Her updated medication list for this problem includes:    Ergocalciferol 50000 Unit Caps (Ergocalciferol) ..... One by mouth daily   Problem # 3:  HYPERTENSION (ICD-401.9)  The following medications were removed from the medication list:    Toprol Xl 50 Mg Tb24 (Metoprolol succinate) .Marland Kitchen..Marland Kitchen Two times a day  Her updated medication list for this problem includes:    Benicar Hct 40-25 Mg Tabs (Olmesartan medoxomil-hctz) ..... Once daily    Metoprolol Tartrate 50 Mg Tabs (Metoprolol tartrate) ..... One by mouth  two times a day  BP today: 180/100 Prior BP: 160/120 (10/19/2007)  Prior 10 Yr Risk Heart Disease: N/A (12/14/2006)  Labs Reviewed: Creat:  0.9 (03/28/2008) Chol: 232 (03/28/2008)   HDL: 44.6 (03/28/2008)   LDL: 160.9 (03/28/2008)   TG: 111 (03/28/2008)   Complete Medication List: 1)  Benicar Hct 40-25 Mg Tabs (Olmesartan medoxomil-hctz) .... Once daily 2)  Vicodin 5-500 Mg Tabs (Hydrocodone-acetaminophen) .... As needed 3)  Alprazolam 0.25 Mg Tabs (Alprazolam) .... As needed 4)  Crestor 20 Mg Tabs (Rosuvastatin calcium) .... One by mouth mwf 5)  Nitrolingual 0.4 Mg/spray Tl Soln (Nitroglycerin) .... Use as directed 6)  Freestyle Lite Strp (Glucose blood) .... Once daily 7)  Ergocalciferol 50000 Unit Caps (Ergocalciferol) .... One by mouth daily 8)  Metoprolol Tartrate 50 Mg Tabs (Metoprolol tartrate) .... One by mouth  two times a day   Patient Instructions: 1)  Please schedule a follow-up appointment in 4 months. 2)  BMP prior to visit, ICD-9:401.90 3)  Hepatic Panel prior to visit, ICD-9:995.20 4)  Lipid Panel prior to visit, ICD-9:272.4   Prescriptions: NITROLINGUAL 0.4 MG/SPRAY TL SOLN (NITROGLYCERIN) use as directed  #1 x 11   Entered and Authorized by:   Stacie Glaze MD   Signed by:   Stacie Glaze MD on 06/23/2008   Method used:   Electronically to        [facility] [phone]* (retail)       [address] [place], [place]  [address]       Ph: [phone]       Fax: [phone]   RxID:   [id number] METOPROLOL TARTRATE 50 MG TABS (METOPROLOL TARTRATE) one by mouth  two times a day  #60 x 11   Entered and Authorized by:   Stacie Glaze MD   Signed by:   Stacie Glaze MD on 04/04/2008   Method used:   Electronically to        [facility] [phone]* (retail)       [address] [place], [place]  [address]       Ph: [phone]       Fax: [phone]   RxID:   [id number] ERGOCALCIFEROL 50000 UNIT CAPS (ERGOCALCIFEROL) one by mouth daily  #5 x 11   Entered and Authorized by:   Stacie Glaze MD   Signed by:   Stacie Glaze  MD on 04/04/2008   Method used:   Electronically to        [facility] [phone]* (retail)       [address] [place], [place]  [address]       Ph: [phone]       Fax: [phone]   RxID:   [id number]

## 2010-03-05 NOTE — Progress Notes (Signed)
  Medications Added ISOSORBIDE MONONITRATE CR 60 MG TB24 (ISOSORBIDE MONONITRATE)  NITROLINGUAL 0.4 MG/SPRAY TL SOLN (NITROGLYCERIN) use as directed       Phone Note Call from Patient Call back at Home Phone [phone] Call back at [phone]   Caller: Patient Call For: [facility] Summary of Call: REQUESTING NEW RX NITROGLYCERIN SPRAY BOTTLE PLS CALL  PHARMACY [facility] [address] Initial call taken by: Shan Levans,  October 06, 2006 10:08 AM  Follow-up for Phone Call        bonnye add tomeds and refill Follow-up by: Stacie Glaze MD,  October 06, 2006 2:08 PM    New/Updated Medications: ISOSORBIDE MONONITRATE CR 60 MG TB24 (ISOSORBIDE MONONITRATE)  NITROLINGUAL 0.4 MG/SPRAY TL SOLN (NITROGLYCERIN) use as directed   Prescriptions: NITROLINGUAL 0.4 MG/SPRAY TL SOLN (NITROGLYCERIN) use as directed  #1 x 3   Entered by:   Willy Eddy, LPN   Authorized by:   Stacie Glaze MD   Signed by:   Willy Eddy, LPN on 41/32/4401   Method used:   Electronically sent to ...       [id number]       [address]       [place], [place]  [address]       Ph: [phone] or [phone]       Fax: [phone]   RxID:   [id number]

## 2010-03-05 NOTE — Assessment & Plan Note (Signed)
Summary: f3y/cad/increased angina/jml  Medications Added IMDUR 60 MG XR24H-TAB (ISOSORBIDE MONONITRATE) once daily DAILY VITAMINS FOR WOMEN  TABS (MULTIPLE VITAMINS-CALCIUM) once daily      Allergies Added:   Visit Type:  65 year followup Primary Provider:  Dr. Darryll Capers  CC:  no complaints.  History of Present Illness: Pleasant female with a history of coronary disease and has had prior PCI of her LAD. Last catheterization performed in 2004 and revealed  a normal left main.The LAD had proximal and mid stent with diffuse in-stent 25% stenosis. There was a distal stent which again had diffuse 25% irregularities within the stented area.  First diagonal was small.  Second and third diagonals were moderate size with diffuse luminal irregularities. The circumflex in the AV groove had diffuse luminal irregularities.  The distal AV groove had 40% stenosis after a third obtuse marginal before a small posterolateral.  First obtuse marginal was small and normal.  The second obtuse marginal was large with ostial 25% stenosis.  A third obtuse marginal was large and normal. The right coronary artery was dominant.  There was proximal 25 and 30% stenosis.  There was mid 25% and 30% stenosis.  There were diffuse luminal irregularities throughout the vessel. catheter ejection fraction was normal. Last Myoview in 2007 showed normal LV function and no ischemia or infarction. I last saw her in January of 2008. Since then she does describe recent chest pain. It is substernal without radiation. It is described as sharp. She takes either nitroglycerin or TUMS with relief. There is no associated symptoms. It occurs predominantly in the morning. It is not exertional or pleuritic. Her last episode was 3 weeks ago. There is no dyspnea, pedal edema or syncope.  Current Medications (verified): 1)  Benicar Hct 40-25 Mg  Tabs (Olmesartan Medoxomil-Hctz) .... Once Daily 2)  Vicodin 5-500 Mg  Tabs (Hydrocodone-Acetaminophen)  .... As Needed 3)  Alprazolam 0.25 Mg  Tabs (Alprazolam) .... As Needed 4)  Crestor 20 Mg  Tabs (Rosuvastatin Calcium) .... One By Mouth Mwf 5)  Nitrolingual 0.4 Mg/spray Tl Soln (Nitroglycerin) .... Use As Directed 6)  Freestyle Test  Strp (Glucose Blood) .Marland Kitchen.. 1 Once Daily 7)  Metoprolol Tartrate 50 Mg Tabs (Metoprolol Tartrate) .... One By Mouth  Two Times A Day 8)  Aspir-Low 81 Mg Tbec (Aspirin) .... One By Mouth Daily 9)  Freestyle Lancets  Misc (Lancets) .Marland Kitchen.. 1 Once Daily 10)  Zyrtec Hives Relief 10 Mg Tabs (Cetirizine Hcl) .... As Directed 11)  Imdur 60 Mg Xr24h-Tab (Isosorbide Mononitrate) .... Once Daily 12)  Daily Vitamins For Women  Tabs (Multiple Vitamins-Calcium) .... Once Daily  Allergies (verified): 1)  ! Sulfa  Past History:  Past Medical History: CORONARY ARTERY DISEASE HYPERTENSION HYPERLIPIDEMIA  OBESITY  ANXIETY  SLEEP APNEA DM  DERMATOPHYTOSIS OF GROIN AND PERIANAL AREA  ANEMIA, VITAMIN B12 DEFICIENCY  OSTEOPOROSIS  DEPRESSION  OSTEOARTHRITIS  back pain with L5 disc Diverticulosis  Past Surgical History: Hysterectomy bariatric surgery coronary angioplasty x 2  2002 Appendectomy Carpal tunnel release Inguinal herniorrhaphy Tonsillectomy ulnar nerve release Total knee replacement Nephrectomy Lumbar laminectomy L3-4 Cholecystectomy Tubal ligation  Family History: Reviewed history from 09/22/2006 and no changes required. Family History of CAD Female 1st degree relative <50  Social History: Reviewed history from 09/22/2006 and no changes required. Retired Married Tobacco abuse Alcohol use-no Drug use-no Regular exercise-yes  Review of Systems       no fevers or chills, productive cough, hemoptysis, dysphasia, odynophagia, melena, hematochezia, dysuria,  hematuria, rash, seizure activity, orthopnea, PND, pedal edema, claudication. Remaining systems are negative.   Vital Signs:  Patient profile:   65 year old female Height:      67  inches Weight:      189.56 pounds BMI:     29.80 Pulse rate:   53 / minute BP sitting:   102 / 60  (left arm) Cuff size:   large  Vitals Entered By: Caralee Ates CMA (October 05, 2009 10:34 AM)  Physical Exam  General:  Well developed/well nourished in NAD Skin warm/dry Patient not depressed No peripheral clubbing Back-normal HEENT-normal/normal eyelids Neck supple/normal carotid upstroke bilaterally; no bruits; no JVD; no thyromegaly chest - CTA/ normal expansion CV - RRR/normal S1 and S2; no murmurs, rubs or gallops; PMI nondisplaced Abdomen -NT/ND, no HSM, no mass, + bowel sounds, no bruit 2+ femoral pulses, no bruits Ext-no edema, chords, 2+ DP Neuro-grossly nonfocal   Ears:      EKG  Procedure date:  10/05/2009  Findings:      Sinus bradycardia at a rate of 53. No ST changes.  Impression & Recommendations:  Problem # 1:  CHEST PAIN (ICD-786.50)  Symptoms somewhat atypical. Schedule Myoview for risk stratification. Her updated medication list for this problem includes:    Nitrolingual 0.4 Mg/spray Tl Soln (Nitroglycerin) ..... Use as directed    Metoprolol Tartrate 50 Mg Tabs (Metoprolol tartrate) ..... One by mouth  two times a day    Aspir-low 81 Mg Tbec (Aspirin) ..... One by mouth daily    Imdur 60 Mg Xr24h-tab (Isosorbide mononitrate) ..... Once daily  Orders: Nuclear Stress Test (Nuc Stress Test)  Problem # 2:  TOBACCO ABUSE (ICD-305.1) Patient counseled on discontinuing.  Problem # 3:  CORONARY ARTERY DISEASE (ICD-414.00) Continue aspirin, beta blocker and statin. Her updated medication list for this problem includes:    Nitrolingual 0.4 Mg/spray Tl Soln (Nitroglycerin) ..... Use as directed    Metoprolol Tartrate 50 Mg Tabs (Metoprolol tartrate) ..... One by mouth  two times a day    Aspir-low 81 Mg Tbec (Aspirin) ..... One by mouth daily    Imdur 60 Mg Xr24h-tab (Isosorbide mononitrate) ..... Once daily  Problem # 4:  HYPERTENSION  (ICD-401.9) Blood pressure controlled on present medications. Will continue. Renal function and potassium monitored by primary care. Her updated medication list for this problem includes:    Benicar Hct 40-25 Mg Tabs (Olmesartan medoxomil-hctz) ..... Once daily    Metoprolol Tartrate 50 Mg Tabs (Metoprolol tartrate) ..... One by mouth  two times a day    Aspir-low 81 Mg Tbec (Aspirin) ..... One by mouth daily  Problem # 5:  HYPERLIPIDEMIA (ICD-272.4) Continue statin. Lipids and liver monitored by primary care. Her updated medication list for this problem includes:    Crestor 20 Mg Tabs (Rosuvastatin calcium) ..... One by mouth mwf  Problem # 6:  DM (ICD-250.00)  Her updated medication list for this problem includes:    Benicar Hct 40-25 Mg Tabs (Olmesartan medoxomil-hctz) ..... Once daily    Aspir-low 81 Mg Tbec (Aspirin) ..... One by mouth daily  Patient Instructions: 1)  Your physician recommends that you schedule a follow-up appointment in: ONE YEAR 2)  Your physician has requested that you have an exercise stress myoview.  For further information please visit [email or web address]  Please follow instruction sheet, as given.

## 2010-03-05 NOTE — Assessment & Plan Note (Signed)
Summary: 2 month rov/njr            Complete Medication List: 1)  Wellbutrin Sr 150 Mg Tb12 (Bupropion hcl) .... Two times a day 2)  Klor-con 20 Meq Pack (Potassium chloride) .... Once daily 3)  Biotin 1000 Mcg Tabs (Biotin) .... 2 every day 4)  Plavix 75 Mg Tabs (Clopidogrel bisulfate) .... Once daily 5)  Imdur 60 Mg Tb24 (Isosorbide mononitrate) .... Once daily 6)  Toprol Xl 50 Mg Tb24 (Metoprolol succinate) .... Two times a day 7)  Benicar Hct 40-25 Mg Tabs (Olmesartan medoxomil-hctz) .... Once daily 8)  Vicodin 5-500 Mg Tabs (Hydrocodone-acetaminophen) .... As needed 9)  Alprazolam 0.25 Mg Tabs (Alprazolam) .... As needed 10)  Crestor 20 Mg Tabs (Rosuvastatin calcium) .... Once daily 11)  Nitrolingual 0.4 Mg/spray Tl Soln (Nitroglycerin) .... Use as directed 12)  Freestyle Lite Strp (Glucose blood) .... Once daily 13)  Vitamin D 04540 Unit Caps (Ergocalciferol) .Marland Kitchen.. 1 every week for 3 months     ]

## 2010-03-05 NOTE — Procedures (Signed)
Summary: Colonoscopy  Patient: Tricia [name] Note: All result statuses are Final unless otherwise noted.  Tests: (1) Colonoscopy (COL)   COL Colonoscopy           DONE     [facility]     [address] [place], [place]  [address]           COLONOSCOPY PROCEDURE REPORT           PATIENT:  [name], [name]  MR#:  [id number]     BIRTHDATE:  August 22, 1945, 63 yrs. old  GENDER:  female     ENDOSCOPIST:  Wilhemina Bonito. Eda Keys, MD     REF. BY:  Surveillance Program Recall,     PROCEDURE DATE:  08/27/2009     PROCEDURE:  Colonoscopy with snare polypectomy x 3     ASA CLASS:  Class II     INDICATIONS:  history of pre-cancerous (adenomatous) colon polyps,     surveillance and high-risk screening ;index exam 02-2004 w/ 2 small     adenomas     MEDICATIONS:   Fentanyl 125 mcg IV, Versed 12.5 mg IV, Benadryl 50     mg IV           DESCRIPTION OF PROCEDURE:   After the risks benefits and     alternatives of the procedure were thoroughly explained, informed     consent was obtained.  Digital rectal exam was performed and     revealed no abnormalities.   The Pentax Colonoscope [id number]     endoscope was introduced through the anus and advanced to the     cecum, which was identified by both the appendix and ileocecal     valve, without limitations.Time to cecum = .  The quality of     the prep was good, using MoviPrep.  The instrument was then slowly     withdrawn (time = 9:00 min) as the colon was fully examined.     <<PROCEDUREIMAGES>>           FINDINGS:  Three polyps were found in the sigmoid (4mm) and     descending colon (35mm,3mm). Polyps were snared without cautery on     insertion . Retrieval was successful.  Scattered diverticula were     found in the ascending colon and sigmoid colon.  This was     otherwise a normal examination of the colon.   Retroflexed views     in the rectum revealed no abnormalities.    The scope was then     withdrawn from the patient and the procedure  completed.           COMPLICATIONS:  None     ENDOSCOPIC IMPRESSION:     1) Three polyps - removed     2) Diverticula, scattered ascending colon and sigmoid colon     3) Otherwise normal examination           RECOMMENDATIONS:     1) Follow up colonoscopy in 5 years (Consider Propofol)           ______________________________     Wilhemina Bonito. Eda Keys, MD           CC:  Stacie Glaze, MD; The Patient           n.     eSIGNED:   Wilhemina Bonito. Eda Keys at 08/27/2009 10:30 AM           Rhae Hammock,  [id number]  Note: An exclamation mark (!) indicates a result that was not dispersed into the flowsheet. Document Creation Date: 08/27/2009 10:33 AM _______________________________________________________________________  (1) Order result status: Final Collection or observation date-time: 08/27/2009 10:17 Requested date-time:  Receipt date-time:  Reported date-time:  Referring Physician:   Ordering Physician: Fransico Setters [phone]) Specimen Source:  Source: Launa Grill Order Number: [id number] Lab site:

## 2010-03-05 NOTE — Assessment & Plan Note (Signed)
Summary: CONTUSION ON L LEG // RS   Vital Signs:  Patient profile:   65 year old female Height:      67 inches Weight:      188 pounds BMI:     29.55 Temp:     98.2 degrees F oral Pulse rate:   70 / minute Resp:     14 per minute BP sitting:   140 / 80  (left arm)  Vitals Entered By: Willy Eddy, LPN (April 04, 1608 3:45 PM)  Nutrition Counseling: Patient's BMI is greater than 25 and therefore counseled on weight management options. CC: c/o bruises on legs on left upper thigh for no reason with itching and pain- completed vit d 50000 in jUne of 2010- c/o finger tips turning white, Hypertension Management   CC:  c/o bruises on legs on left upper thigh for no reason with itching and pain- completed vit d 50000 in jUne of 2010- c/o finger tips turning white and Hypertension Management.  History of Present Illness: was in [place] and the pt developed hematuria and UTI . She was told her potassium was high and she was anemic with low b12 she has since developed bruising, urticuria  but the heamturia has resolved no comment was made about renal function increase edema and urticuria worsisome for renal dz?  Hypertension History:      She complains of dyspnea with exertion and peripheral edema, but denies headache, chest pain, palpitations, orthopnea, PND, visual symptoms, neurologic problems, syncope, and side effects from treatment.  She notes no problems with any antihypertensive medication side effects.        Positive major cardiovascular risk factors include female age 57 years old or older, hyperlipidemia, and hypertension.  Negative major cardiovascular risk factors include non-tobacco-user status.        Positive history for target organ damage include ASHD (either angina/prior MI/prior CABG).     Preventive Screening-Counseling & Management  Alcohol-Tobacco     Smoking Status: quit  Problems Prior to Update: 1)  Uri  (ICD-465.9) 2)  Headache  (ICD-784.0) 3)   Abdominal Pain Right Lower Quadrant  (ICD-789.03) 4)  Osteoporosis  (ICD-733.00) 5)  Physical Examination  (ICD-V70.0) 6)  Family History of Cad Female 1st Degree Relative <50  (ICD-V17.3) 7)  Back Pain  (ICD-724.5) 8)  Depression  (ICD-311) 9)  Osteoarthritis  (ICD-715.90) 10)  Hypertension  (ICD-401.9) 11)  Hyperlipidemia  (ICD-272.4) 12)  Coronary Artery Disease  (ICD-414.00)  Current Problems (verified): 1)  Uri  (ICD-465.9) 2)  Headache  (ICD-784.0) 3)  Abdominal Pain Right Lower Quadrant  (ICD-789.03) 4)  Osteoporosis  (ICD-733.00) 5)  Physical Examination  (ICD-V70.0) 6)  Family History of Cad Female 1st Degree Relative <50  (ICD-V17.3) 7)  Back Pain  (ICD-724.5) 8)  Depression  (ICD-311) 9)  Osteoarthritis  (ICD-715.90) 10)  Hypertension  (ICD-401.9) 11)  Hyperlipidemia  (ICD-272.4) 12)  Coronary Artery Disease  (ICD-414.00)  Medications Prior to Update: 1)  Benicar Hct 40-25 Mg  Tabs (Olmesartan Medoxomil-Hctz) .... Once Daily 2)  Vicodin 5-500 Mg  Tabs (Hydrocodone-Acetaminophen) .... As Needed 3)  Alprazolam 0.25 Mg  Tabs (Alprazolam) .... As Needed 4)  Crestor 20 Mg  Tabs (Rosuvastatin Calcium) .... One By Mouth Mwf 5)  Nitrolingual 0.4 Mg/spray Tl Soln (Nitroglycerin) .... Use As Directed 6)  Freestyle Lite   Strp (Glucose Blood) .... Once Daily 7)  Ergocalciferol 50000 Unit Caps (Ergocalciferol) .... One By Mouth Daily 8)  Metoprolol Tartrate 50  Mg Tabs (Metoprolol Tartrate) .... One By Mouth  Two Times A Day  Current Medications (verified): 1)  Benicar Hct 40-25 Mg  Tabs (Olmesartan Medoxomil-Hctz) .... Once Daily 2)  Vicodin 5-500 Mg  Tabs (Hydrocodone-Acetaminophen) .... As Needed 3)  Alprazolam 0.25 Mg  Tabs (Alprazolam) .... As Needed 4)  Crestor 20 Mg  Tabs (Rosuvastatin Calcium) .... One By Mouth Mwf 5)  Nitrolingual 0.4 Mg/spray Tl Soln (Nitroglycerin) .... Use As Directed 6)  Freestyle Lite   Strp (Glucose Blood) .... Once Daily 7)  Metoprolol Tartrate 50  Mg Tabs (Metoprolol Tartrate) .... One By Mouth  Two Times A Day 8)  Betamethasone Dipropionate 0.05 % Lotn (Betamethasone Dipropionate) .... Mix 1/4 Betamethasone With 3/4 Sarna Lotion and Apply Two Times A Day To Dry Inching Skin 9)  Aspir-Low 81 Mg Tbec (Aspirin) .... One By Mouth Daily  Allergies (verified): No Known Drug Allergies  Past History:  Family History: Last updated: 09/22/2006 father  Family History of CAD Female 1st degree relative <50  Social History: Last updated: 09/22/2006 Retired Married Former Smoker Alcohol use-no Drug use-no Regular exercise-yes  Risk Factors: Exercise: yes (09/22/2006)  Risk Factors: Smoking Status: quit (04/04/2009)  Past medical, surgical, family and social histories (including risk factors) reviewed, and no changes noted (except as noted below).  Past Medical History: Reviewed history from 03/15/2007 and no changes required. Hyperlipidemia Coronary artery disease Hypertension Osteoarthritis Depression back pain with L5 disc Osteoporosis Headache  Past Surgical History: Reviewed history from 09/22/2006 and no changes required. Hysterectomy bariatric surgery coronary angioplasty x 2  2002 stent placement Appendectomy Carpal tunnel release Inguinal herniorrhaphy Tonsillectomy ulnar nerve release Total knee replacement Nephrectomy Lumbar laminectomy L3-4 Cholecystectomy Tubal ligation  Family History: Reviewed history from 09/22/2006 and no changes required. father  Family History of CAD Female 1st degree relative <50  Social History: Reviewed history from 09/22/2006 and no changes required. Retired Married Former Smoker Alcohol use-no Drug use-no Regular exercise-yes  Review of Systems       The patient complains of peripheral edema.  The patient denies anorexia, fever, weight loss, weight gain, vision loss, decreased hearing, hoarseness, chest pain, syncope, dyspnea on exertion, prolonged cough,  headaches, hemoptysis, abdominal pain, melena, hematochezia, severe indigestion/heartburn, hematuria, incontinence, genital sores, muscle weakness, suspicious skin lesions, transient blindness, difficulty walking, depression, unusual weight change, abnormal bleeding, enlarged lymph nodes, angioedema, and breast masses.    Physical Exam  General:  alert and well-nourished.   Head:  normocephalic and no abnormalities palpated.   Eyes:  pupils equal and pupils round.   Ears:  R ear normal and L ear normal.   Nose:  no external deformity and no nasal discharge.   Neck:  No deformities, masses, or tenderness noted. Lungs:  normal respiratory effort and no wheezes.   Heart:  normal rate, regular rhythm, and no rub.   Abdomen:  Bowel sounds positive,abdomen soft and non-tender without masses, organomegaly or hernias noted. Msk:  no joint tenderness and no joint swelling.   Extremities:  trace left pedal edema and 1+ right pedal edema.   Skin:  turgor normal, color normal, and ecchymosis.   Cervical Nodes:  No lymphadenopathy noted Axillary Nodes:  No palpable lymphadenopathy Psych:  Oriented X3 and good eye contact.     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 femoral cutaneous nerve pain allergies  check cbc depo 120 rdx allergy cream  Orders: Prescription Created Electronically [phone]) Venipuncture [phone])  TLB-CBC Platelet - w/Differential (85025-CBCD) TLB-Hepatic/Liver Function Pnl (80076-HEPATIC)  Problem # 2:  LEG CRAMPS (ICD-729.82) moniter potassium Orders: TLB-BMP (Basic Metabolic Panel-BMET) (80048-METABOL)  Problem # 3:  CORONARY ARTERY DISEASE (ICD-414.00)  resume the aspirin 82mh Her updated medication list for this problem includes:    Benicar Hct 40-25 Mg Tabs (Olmesartan medoxomil-hctz) ..... Once daily    Nitrolingual 0.4 Mg/spray Tl Soln (Nitroglycerin) ..... Use as directed    Metoprolol Tartrate  50 Mg Tabs (Metoprolol tartrate) ..... One by mouth  two times a day    Aspir-low 81 Mg Tbec (Aspirin) ..... One by mouth daily  Labs Reviewed: Chol: 232 (03/28/2008)   HDL: 44.6 (03/28/2008)   LDL: DEL (03/28/2008)   TG: 111 (03/28/2008)  Problem # 4:  HYPERTENSION (ICD-401.9)  Her updated medication list for this problem includes:    Benicar Hct 40-25 Mg Tabs (Olmesartan medoxomil-hctz) ..... Once daily    Metoprolol Tartrate 50 Mg Tabs (Metoprolol tartrate) ..... One by mouth  two times a day  Orders: TLB-BMP (Basic Metabolic Panel-BMET) (80048-METABOL)  BP today: 180/100 Prior BP: 180/100 (04/04/2008)  Prior 10 Yr Risk Heart Disease: N/A (12/14/2006)  Labs Reviewed: K+: 3.6 (03/28/2008) Creat: : 0.9 (03/28/2008)   Chol: 232 (03/28/2008)   HDL: 44.6 (03/28/2008)   LDL: DEL (03/28/2008)   TG: 111 (03/28/2008)  Problem # 5:  ANEMIA, VITAMIN B12 DEFICIENCY (ICD-281.1) in [place] reporte low b12 and anemia imperically b12 injection 1.5 cc Hgb: 12.8 (03/28/2008)   Hct: 38.1 (03/28/2008)   Platelets: 237 (03/28/2008) RBC: 4.68 (03/28/2008)   RDW: 12.7 (03/28/2008)   WBC: 5.6 (03/28/2008) MCV: 81.5 (03/28/2008)   MCHC: 33.5 (03/28/2008) Iron: 34 (03/28/2008)   B12: 224 (03/28/2008)   TSH: 1.78 (03/28/2008)  Problem # 6:  UTI (ICD-599.0) repeat for proof of cure  Orders: T-Culture, Urine (16109-60454) TLB-Udip w/ Micro (81001-URINE)  Encouraged to push clear liquids, get enough rest, and take acetaminophen as needed. To be seen in 10 days if no improvement, sooner if worse.  Complete Medication List: 1)  Benicar Hct 40-25 Mg Tabs (Olmesartan medoxomil-hctz) .... Once daily 2)  Vicodin 5-500 Mg Tabs (Hydrocodone-acetaminophen) .... As needed 3)  Alprazolam 0.25 Mg Tabs (Alprazolam) .... As needed 4)  Crestor 20 Mg Tabs (Rosuvastatin calcium) .... One by mouth mwf 5)  Nitrolingual 0.4 Mg/spray Tl Soln (Nitroglycerin) .... Use as directed 6)  Freestyle Lite Strp (Glucose blood)  .... Once daily 7)  Metoprolol Tartrate 50 Mg Tabs (Metoprolol tartrate) .... One by mouth  two times a day 8)  Betamethasone Dipropionate 0.05 % Lotn (Betamethasone dipropionate) .... Mix 1/4 betamethasone with 3/4 sarna lotion and apply two times a day to dry inching skin 9)  Aspir-low 81 Mg Tbec (Aspirin) .... One by mouth daily  Hypertension Assessment/Plan:      The patient's hypertensive risk group is category C: Target organ damage and/or diabetes.  Today's blood pressure is 140/80.  Her blood pressure goal is < 140/90.  Patient Instructions: 1)  keep physical Prescriptions: BETAMETHASONE DIPROPIONATE 0.05 % LOTN (BETAMETHASONE DIPROPIONATE) mix 1/4 betamethasone with 3/4 sarna lotion and apply two times a day to dry inching skin  #30gm/120gm x 11   Entered and Authorized by:   Stacie Glaze MD   Signed by:   Stacie Glaze MD on 04/04/2009   Method used:   Electronically to        [id number] (retail)       [address]  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)    Route: IM    Site: LUOQ gluteus    Exp Date: 11/03/2011    Lot #: [id number] [place]    Mfr: American Regent    Patient tolerated injection without complications    Given by: Willy Eddy, LPN (April 04, 5636 4:39 PM)  Injection # 2:    Medication: Vit B12 1000 mcg    Diagnosis: LEG CRAMPS (ICD-729.82)    Route: IM    Site: R deltoid    Exp Date: 11/02/2010    Lot #: [id number]    Mfr: American Regent     Comments: 1.5/1500mncg giveb    Patient tolerated injection without complications    Given by: Willy Eddy, LPN (April 05, 3327 4:40 PM)  Orders Added: 1)  Depo- Medrol 80mg  [J1040] 2)  Admin of Therapeutic Inj  intramuscular or subcutaneous [96372] 3)  Vit B12 1000 mcg [J3420]  Appended Document: CONTUSION ON L LEG // RS  Laboratory Results   Urine Tests    Routine Urinalysis   Color: yellow Appearance: Clear Glucose: negative   (Normal Range: Negative) Bilirubin: negative   (Normal Range: Negative) Ketone: negative   (Normal Range: Negative) Spec. Gravity: 1.020   (Normal Range: 1.003-1.035) Blood: negative   (Normal Range: Negative) pH: 5.5   (Normal Range: 5.0-8.0) Protein: negative   (Normal Range: Negative) Urobilinogen: 0.2   (Normal Range: 0-1) Nitrite: negative   (Normal Range: Negative) Leukocyte Esterace: negative   (Normal Range: Negative)    Comments: Rita Ohara  April 04, 2009 5:08 PM

## 2010-03-05 NOTE — Assessment & Plan Note (Signed)
Summary: RENEW MEDS PER DR/CCM  Medications Added WELLBUTRIN SR 150 MG  TB12 (BUPROPION HCL) two times a day BIOTIN 1000 MCG  TABS (BIOTIN) 2 every day PLAVIX 75 MG  TABS (CLOPIDOGREL BISULFATE) once daily IMDUR 60 MG  TB24 (ISOSORBIDE MONONITRATE) once daily VYTORIN 10-40 MG  TABS (EZETIMIBE-SIMVASTATIN) once daily TOPROL XL 50 MG  TB24 (METOPROLOL SUCCINATE) two times a day BENICAR HCT 40-25 MG  TABS (OLMESARTAN MEDOXOMIL-HCTZ) once daily KLOR-CON 20 MEQ  PACK (POTASSIUM CHLORIDE) .qd VICODIN 5-500 MG  TABS (HYDROCODONE-ACETAMINOPHEN) as needed ALPRAZOLAM 0.25 MG  TABS (ALPRAZOLAM) as needed NEXIUM 40 MG  CPDR (ESOMEPRAZOLE MAGNESIUM) once daily CRESTOR 20 MG  TABS (ROSUVASTATIN CALCIUM) once daily VITAMIN D 1000 UNIT  TABS (CHOLECALCIFEROL) once daily       21  22  23  24  25    Vital Signs:  Patient Profile:   65 Years Old Female Height:     67 inches Weight:      204 pounds Temp:     98.1 degrees F oral Pulse rate:   76 / minute Resp:     14 per minute BP sitting:   150 / 70  (left arm)               Chief Complaint:  to discuss med regimen.  History of Present Illness:  Hypertension Follow-Up      This is a 65 year old woman who presents for Hypertension follow-up.  The patient denies lightheadedness, urinary frequency, headaches, edema, impotence, rash, and fatigue.  The patient denies the following associated symptoms: chest pain, chest pressure, exercise intolerance, dyspnea, palpitations, syncope, leg edema, and pedal edema.  Compliance with medications (by patient report) has been near 100%.  The patient reports that dietary compliance has been good.  The patient reports exercising occasionally.  Adjunctive measures currently used by the patient include salt restriction and relaxation.    Has intermitnat abd pain from scar tissue    Past Medical History:    Reviewed history and no changes required:       Hyperlipidemia       Coronary artery disease    Hypertension       Osteoarthritis       Depression       back pain with L5 disc  Past Surgical History:    Reviewed history and no changes required:       Hysterectomy       bariatric surgery       coronary angioplasty x 2  2002       stent placement       Appendectomy       Carpal tunnel release       Inguinal herniorrhaphy       Tonsillectomy       ulnar nerve release       Total knee replacement       Nephrectomy       Lumbar laminectomy L3-4       Cholecystectomy       Tubal ligation   Family History:    Reviewed history and no changes required:       father        Family History of CAD Female 1st degree relative <50  Social History:    Reviewed history and no changes required:       Retired       Married       Former Smoker  Alcohol use-no       Drug use-no       Regular exercise-yes   Risk Factors:  Tobacco use:  quit Drug use:  no Alcohol use:  no Exercise:  yes  Colonoscopy History:     Date of Last Colonoscopy:  02/07/2004    Results:  abnormal     Physical Exam  General:     Well-developed,well-nourished,in no acute distress; alert,appropriate and cooperative throughout examination Head:     normocephalic.   Eyes:     pupils equal and pupils round.   Ears:     R ear normal and L ear normal.   Nose:     no external deformity.   Mouth:     pharynx pink and moist.   Neck:     supple.  stiff Lungs:     normal respiratory effort, no dullness, no crackles, and no wheezes.   Heart:     normal rate and regular rhythm.   Abdomen:     soft and distended.   skin folds from weight loss Msk:     no joint tenderness, no joint swelling, no joint warmth, and no redness over joints.   Pulses:     R and L carotid,radial,femoral,dorsalis pedis and posterior tibial pulses are full and equal bilaterally Extremities:     No clubbing, cyanosis, edema, or deformity noted with normal full range of motion of all joints.   Neurologic:     alert &  oriented X3.   Cervical Nodes:     No lymphadenopathy noted Axillary Nodes:     No palpable lymphadenopathy Psych:     Oriented X3.     Problems:  Medical Problems Added: 1)  Dx of Family History of Cad Female 1st Degree Relative <50  (ICD-V17.3) 2)  Dx of Back Pain  (ICD-724.5) 3)  Dx of Depression  (ICD-311) 4)  Dx of Osteoarthritis  (ICD-715.90) 5)  Dx of Hypertension  (ICD-401.9) 6)  Dx of Hyperlipidemia  (ICD-272.4) 7)  Dx of Coronary Artery Disease  (ICD-414.00)   Impression & Recommendations:  Problem # 1:  HYPERLIPIDEMIA (ICD-272.4)  The following medications were removed from the medication list:    Vytorin 10-40 Mg Tabs (Ezetimibe-simvastatin) ..... Once daily  Her updated medication list for this problem includes:    Crestor 20 Mg Tabs (Rosuvastatin calcium) ..... Once daily  Labs Reviewed: Chol: 174 (12/11/2005)   HDL: 46.3 (12/11/2005)   LDL: 103 (12/11/2005)   TG: 122 (12/11/2005) SGOT: 13 (12/11/2005)   SGPT: 10 (12/11/2005)   Problem # 2:  HYPERTENSION (ICD-401.9)  Her updated medication list for this problem includes:    Toprol Xl 50 Mg Tb24 (Metoprolol succinate) .Marland Kitchen..Marland Kitchen Two times a day    Benicar Hct 40-25 Mg Tabs (Olmesartan medoxomil-hctz) ..... Once daily  BP today: 150/70  Labs Reviewed: Creat: 1.0 (12/11/2005) Chol: 174 (12/11/2005)   HDL: 46.3 (12/11/2005)   LDL: 103 (12/11/2005)   TG: 122 (12/11/2005)   Problem # 3:  OSTEOARTHRITIS (ICD-715.90)  Her updated medication list for this problem includes:    Vicodin 5-500 Mg Tabs (Hydrocodone-acetaminophen) .Marland Kitchen... As needed Discussed use of medications, application of heat or cold, and exercises.   Problem # 4:  BACK PAIN (ICD-724.5)  Her updated medication list for this problem includes:    Vicodin 5-500 Mg Tabs (Hydrocodone-acetaminophen) .Marland Kitchen... As needed Discussed use of moist heat or ice, modified activities, medications, and stretching/strengthening exercises. Back care instructions given.  To be seen in  2 weeks if no improvement; sooner if worsening of symptoms.   Complete Medication List: 1)  Wellbutrin Sr 150 Mg Tb12 (Bupropion hcl) .... Two times a day 2)  Klor-con 20 Meq Pack (Potassium chloride) .... Once daily 3)  Biotin 1000 Mcg Tabs (Biotin) .... 2 every day 4)  Plavix 75 Mg Tabs (Clopidogrel bisulfate) .... Once daily 5)  Imdur 60 Mg Tb24 (Isosorbide mononitrate) .... Once daily 6)  Toprol Xl 50 Mg Tb24 (Metoprolol succinate) .... Two times a day 7)  Benicar Hct 40-25 Mg Tabs (Olmesartan medoxomil-hctz) .... Once daily 8)  Vicodin 5-500 Mg Tabs (Hydrocodone-acetaminophen) .... As needed 9)  Alprazolam 0.25 Mg Tabs (Alprazolam) .... As needed 10)  Nexium 40 Mg Cpdr (Esomeprazole magnesium) .... Once daily 11)  Crestor 20 Mg Tabs (Rosuvastatin calcium) .... Once daily 12)  Vitamin D 1000 Unit Tabs (Cholecalciferol) .... Once daily   Patient Instructions: 1)  Please schedule a follow-up appointment in 3 months. 2)  CPX 3)  BMP prior to visit, ICD-9: 4)  Hepatic Panel prior to visit, ICD-9: 5)  Lipid Panel prior to visit, ICD-9:    v70.0 6)  TSH prior to visit, ICD-9: 7)  CBC w/ Diff prior to visit, ICD-9: 8)  Urine-dip prior to visit, ICD-9: 9)  B12   281.0 10)  iron 285.9 11)  vit d 733.00    Prescriptions: NEXIUM 40 MG  CPDR (ESOMEPRAZOLE MAGNESIUM) once daily  #30 x 11   Entered and Authorized by:   Stacie Glaze MD   Signed by:   Stacie Glaze MD on 09/22/2006   Method used:   Print then Give to Patient   RxID:   [id number] TOPROL XL 50 MG  TB24 (METOPROLOL SUCCINATE) two times a day  #30 x 11   Entered and Authorized by:   Stacie Glaze MD   Signed by:   Stacie Glaze MD on 09/22/2006   Method used:   Print then Give to Patient   RxID:   [id number] WELLBUTRIN SR 150 MG  TB12 (BUPROPION HCL) two times a day  #60 x 6   Entered and Authorized by:   Stacie Glaze MD   Signed by:   Stacie Glaze MD on 09/22/2006   Method used:    Print then Give to Patient   RxID:   [id number] IMDUR 60 MG  TB24 (ISOSORBIDE MONONITRATE) once daily  #30 x 6   Entered and Authorized by:   Stacie Glaze MD   Signed by:   Stacie Glaze MD on 09/22/2006   Method used:   Print then Give to Patient   RxID:   [id number] KLOR-CON 20 MEQ  PACK (POTASSIUM CHLORIDE) once daily  #30 x 11   Entered and Authorized by:   Stacie Glaze MD   Signed by:   Stacie Glaze MD on 09/22/2006   Method used:   Print then Give to Patient   RxID:   [id number]    Preventive Care Screening  Colonoscopy:    Date:  02/07/2004    Results:  abnormal    Tetanus/Td Immunization History:    Tetanus/Td # 1:  Td (02/03/2005)  Pneumovax Immunization History:    Pneumovax # 1:  Pneumovax (02/04/2003)   Appended Document: Orders Update    Clinical Lists Changes  Orders: Added new Service order of Est. Patient Level IV (66063) - Signed

## 2010-03-05 NOTE — Progress Notes (Signed)
Summary: allergies  Phone Note Call from Patient   Caller: Patient Call For: Stacie Glaze MD Reason for Call: Refill Medication Summary of Call: Pt. is taking Cipro, but has developed allergy symptoms......Marland Kitchenrunney eyes, itchy nose, and running, sneezing.  Wants Rx..  Dr. Thurston Hole is giving her prednosone for her back AFTER she finishes the antibiotic.  [facility] ([place]) [phone] Initial call taken by: Lynann Beaver CMA,  April 23, 2009 9:00 AM  Follow-up for Phone Call        looks like she started cipro 3-7 for 14 days wh ich she should complete today- Follow-up by: Willy Eddy, LPN,  April 23, 2009 9:03 AM  Additional Follow-up for Phone Call Additional follow up Details #1::        zyrtec otc- can 70 pills for 35.oo at [facility] Additional Follow-up by: Willy Eddy, LPN,  April 23, 2009 9:06 AM    New/Updated Medications: ZYRTEC HIVES RELIEF 10 MG TABS (CETIRIZINE HCL) as directed Prescriptions: ZYRTEC HIVES RELIEF 10 MG TABS (CETIRIZINE HCL) as directed  #70 x 0   Entered by:   Lynann Beaver CMA   Authorized by:   Stacie Glaze MD   Signed by:   Lynann Beaver CMA on 04/23/2009   Method used:   Electronically to        [id number]* (retail)       [address] [place], [place]  [address]       Ph: [phone]       Fax: [phone]   RxID:   [id number]  Pt advised.

## 2010-03-05 NOTE — Letter (Signed)
Summary: Diabetic Eye Exam/[facility]  Diabetic Eye Exam/[facility]   Imported By: Maryln Gottron 09/10/2009 11:15:00  _____________________________________________________________________  External Attachment:    Type:   Image     Comment:   External Document

## 2010-03-05 NOTE — Consult Note (Signed)
Summary: [facility]  [facility]   Imported By: Esmeralda Links D'jimraou 07/15/2007 13:54:20  _____________________________________________________________________  External Attachment:    Type:   Image     Comment:   External Document

## 2010-03-07 NOTE — Assessment & Plan Note (Signed)
Summary: 3 month follow up/cjr   Vital Signs:  Patient profile:   65 year old female Height:      67 inches Weight:      195 pounds Temp:     97.5 degrees F oral Pulse rate:   53 / minute BP sitting:   140 / 78  (left arm) Cuff size:   large  Vitals Entered By: Kathlene November LPN (February 07, 2010 9:37 AM) CC: follow-up cholesterol- go over labwork, Hypertension Management, Type 2 diabetes mellitus follow-up   Primary Care Provider:  Dr. Darryll Capers  CC:  follow-up cholesterol- go over labwork, Hypertension Management, and Type 2 diabetes mellitus follow-up.  History of Present Illness: eating pizza as a vegitable!  Type 2 Diabetes Mellitus Follow-Up      This is a 65 year old woman who presents for Type 2 diabetes mellitus follow-up.  The patient denies polyuria, polydipsia, blurred vision, self managed hypoglycemia, hypoglycemia requiring help, weight loss, weight gain, and numbness of extremities.  The patient denies the following symptoms: neuropathic pain, chest pain, vomiting, orthostatic symptoms, poor wound healing, intermittent claudication, vision loss, and foot ulcer.  Since the last visit the patient reports poor dietary compliance.    Hypertension History:      She denies headache, chest pain, palpitations, dyspnea with exertion, orthopnea, PND, peripheral edema, visual symptoms, neurologic problems, syncope, and side effects from treatment.        Positive major cardiovascular risk factors include female age 55 years old or older, diabetes, hyperlipidemia, and hypertension.  Negative major cardiovascular risk factors include non-tobacco-user status.        Positive history for target organ damage include ASHD (either angina/prior MI/prior CABG).     Preventive Screening-Counseling & Management  Alcohol-Tobacco     Smoking Status: quit > 6 months     Tobacco Counseling: to remain off tobacco products  Problems Prior to Update: 1)  Acute Maxillary Sinusitis   (ICD-461.0) 2)  Tobacco Abuse  (ICD-305.1) 3)  Chest Pain  (ICD-786.50) 4)  Coronary Artery Disease  (ICD-414.00) 5)  Hypertension  (ICD-401.9) 6)  Hyperlipidemia  (ICD-272.4) 7)  Obesity  (ICD-278.00) 8)  Anxiety  (ICD-300.00) 9)  Sleep Apnea  (ICD-780.57) 10)  Dm  (ICD-250.00) 11)  Dermatophytosis of Groin and Perianal Area  (ICD-110.3) 12)  Anemia, Vitamin B12 Deficiency  (ICD-281.1) 13)  Uti  (ICD-599.0) 14)  Leg Cramps  (ICD-729.82) 15)  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 Once Daily 2)  Vicodin 5-500 Mg  Tabs (Hydrocodone-Acetaminophen) .... As Needed 3)  Alprazolam 0.25 Mg  Tabs (Alprazolam) .... As Needed 4)  Crestor 20 Mg  Tabs (Rosuvastatin Calcium) .... One By Mouth Mwf 5)  Nitrolingual 0.4 Mg/spray Tl Soln (Nitroglycerin) .... Use As Directed 6)  Freestyle Test  Strp (Glucose Blood) .Marland Kitchen.. 1 Once Daily 7)  Metoprolol Tartrate 50 Mg Tabs (Metoprolol Tartrate) .... One By Mouth  Two Times A Day 8)  Aspir-Low 81 Mg Tbec (Aspirin) .... One By Mouth Daily 9)  Freestyle Lancets  Misc (Lancets) .Marland Kitchen.. 1 Once  Daily 10)  Zyrtec Hives Relief 10 Mg Tabs (Cetirizine Hcl) .... As Directed 11)  Imdur 60 Mg Xr24h-Tab (Isosorbide Mononitrate) .... Once Daily 12)  Daily Vitamins For Women  Tabs (Multiple Vitamins-Calcium) .... Once Daily 13)  Zithromax Z-Pak 250 Mg Tabs (Azithromycin) .... As Directed 14)  Allegra-D Allergy & Congestion 60-120 Mg Xr12h-Tab (Fexofenadine-Pseudoephedrine) .... As Directed 15)  Atuss Ds 30-4-30 Mg/41ml Susp (Pseudoephed Hcl-Cpm-Dm Hbr Tan)  .... Two Tsp By Mouth Two Times A Day  Current Medications (verified): 1)  Benicar Hct 40-25 Mg  Tabs (Olmesartan Medoxomil-Hctz) .... Once Daily 2)  Vicodin 5-500 Mg  Tabs (Hydrocodone-Acetaminophen) .... As Needed 3)  Alprazolam 0.25 Mg  Tabs (Alprazolam) .... As Needed 4)  Crestor 20 Mg  Tabs (Rosuvastatin Calcium) .... One By Mouth Mwf 5)  Nitrolingual 0.4 Mg/spray Tl Soln (Nitroglycerin) .... Use As Directed 6)  Freestyle Test  Strp (Glucose Blood) .Marland Kitchen.. 1 Once Daily 7)  Metoprolol Tartrate 50 Mg Tabs (Metoprolol Tartrate) .... One By Mouth  Two Times A Day 8)  Aspir-Low 81 Mg Tbec (Aspirin) .... One By Mouth Daily 9)  Freestyle Lancets  Misc (Lancets) .Marland Kitchen.. 1 Once Daily 10)  Zyrtec Hives Relief 10 Mg Tabs (Cetirizine Hcl) .... As Directed 11)  Imdur 60 Mg Xr24h-Tab (Isosorbide Mononitrate) .... Once Daily 12)  Daily Vitamins For Women  Tabs (Multiple Vitamins-Calcium) .... Once Daily  Allergies (verified): 1)  ! Sulfa 2)  ! Septra  Comments:  Nurse/Medical Assistant: The patient's medications and allergies were reviewed with the patient and were updated in the Medication and Allergy Lists. Kathlene November LPN (February 07, 2010 9:39 AM)  Past History:  Family History: Last updated: 10/05/2009 Family History of CAD Female 1st degree relative <50  Social History: Last updated: 10/05/2009 Retired Married Tobacco abuse Alcohol use-no Drug use-no Regular exercise-yes  Risk Factors: Exercise: yes (09/22/2006)  Risk Factors: Smoking Status: quit > 6 months (02/07/2010)  Past medical, surgical, family and social histories (including risk factors) reviewed, and no changes noted (except as noted below).  Past Medical History: Reviewed history from 10/05/2009 and no changes required. CORONARY ARTERY DISEASE HYPERTENSION HYPERLIPIDEMIA  OBESITY  ANXIETY  SLEEP APNEA DM  DERMATOPHYTOSIS OF GROIN AND PERIANAL AREA  ANEMIA, VITAMIN B12 DEFICIENCY  OSTEOPOROSIS  DEPRESSION    OSTEOARTHRITIS  back pain with L5 disc Diverticulosis  Past Surgical History: Reviewed history from 10/05/2009 and no changes required. Hysterectomy bariatric surgery coronary angioplasty x 2  2002 Appendectomy Carpal tunnel release Inguinal herniorrhaphy Tonsillectomy ulnar nerve release Total knee replacement Nephrectomy Lumbar laminectomy L3-4 Cholecystectomy Tubal ligation  Family History: Reviewed history from 10/05/2009 and no changes required. Family History of CAD Female 1st degree relative <50  Social History: Reviewed history from 10/05/2009 and no changes required. Retired Married Tobacco abuse Alcohol use-no Drug use-no Regular exercise-yes Smoking Status:  quit > 6 months  Review of Systems  The patient denies anorexia, fever, weight loss, weight gain, vision loss, decreased hearing, hoarseness, chest pain, syncope, dyspnea on exertion, peripheral edema, prolonged cough, headaches, hemoptysis, abdominal pain, melena, hematochezia, severe indigestion/heartburn, hematuria, incontinence, genital sores, muscle weakness, suspicious skin lesions, transient blindness, difficulty walking, depression, unusual weight change, abnormal bleeding, enlarged lymph nodes, angioedema, and breast masses.    Physical Exam  General:  alert and well-nourished.   Head:  normocephalic and no abnormalities palpated.   Ears:  R ear normal and L ear normal.   Nose:  no external deformity and no nasal discharge.  Mouth:  no gingival abnormalities and pharynx pink and moist.   Neck:  No deformities, masses, or tenderness noted. Lungs:  normal respiratory effort and no wheezes.   Heart:  normal rate, regular rhythm, and no rub.   Abdomen:  Bowel sounds positive,abdomen soft and non-tender without masses, organomegaly or hernias noted. Msk:  no joint tenderness and no joint swelling.  TKR on right Extremities:  trace left pedal edema and trace right pedal edema.   Neurologic:  No  cranial nerve deficits noted. Station and gait are normal. Plantar reflexes are down-going bilaterally. DTRs are symmetrical throughout. Sensory, motor and coordinative functions appear intact.   Impression & Recommendations:  Problem # 1:  HYPERTENSION (ICD-401.9) Assessment Unchanged  Her updated medication list for this problem includes:    Benicar Hct 40-25 Mg Tabs (Olmesartan medoxomil-hctz) ..... Once daily    Metoprolol Tartrate 50 Mg Tabs (Metoprolol tartrate) ..... One by mouth  two times a day  BP today: 140/78 Prior BP: 130/76 (11/07/2009)  Prior 10 Yr Risk Heart Disease: N/A (12/14/2006)  Labs Reviewed: K+: 4.4 (01/21/2010) Creat: : 0.8 (01/21/2010)   Chol: 167 (01/21/2010)   HDL: 42.80 (01/21/2010)   LDL: 91 (01/21/2010)   TG: 164.0 (01/21/2010)  Problem # 2:  HYPERLIPIDEMIA (ICD-272.4) Assessment: Improved the three time a week has worked at goal Her updated medication list for this problem includes:    Crestor 20 Mg Tabs (Rosuvastatin calcium) ..... One by mouth mwf  Labs Reviewed: SGOT: 18 (01/21/2010)   SGPT: 12 (01/21/2010)  Lipid Goals: Chol Goal: 200 (11/07/2009)   HDL Goal: 40 (11/07/2009)   LDL Goal: 70 (11/07/2009)   TG Goal: 150 (11/07/2009)  Prior 10 Yr Risk Heart Disease: N/A (12/14/2006)   HDL:42.80 (01/21/2010), 44.90 (10/30/2009)  LDL:91 (01/21/2010), 114 (21/30/8657)  Chol:167 (01/21/2010), 256 (10/30/2009)  Trig:164.0 (01/21/2010), 147.0 (10/30/2009)  Problem # 3:  CORONARY ARTERY DISEASE (ICD-414.00) Assessment: Improved continued risk reductions with lipids now at gpoal Her updated medication list for this problem includes:    Benicar Hct 40-25 Mg Tabs (Olmesartan medoxomil-hctz) ..... Once daily    Nitrolingual 0.4 Mg/spray Tl Soln (Nitroglycerin) ..... Use as directed    Metoprolol Tartrate 50 Mg Tabs (Metoprolol tartrate) ..... One by mouth  two times a day    Aspir-low 81 Mg Tbec (Aspirin) ..... One by mouth daily    Imdur 60 Mg  Xr24h-tab (Isosorbide mononitrate) ..... Once daily  Complete Medication List: 1)  Benicar Hct 40-25 Mg Tabs (Olmesartan medoxomil-hctz) .... Once daily 2)  Vicodin 5-500 Mg Tabs (Hydrocodone-acetaminophen) .... As needed 3)  Alprazolam 0.25 Mg Tabs (Alprazolam) .... As needed 4)  Crestor 20 Mg Tabs (Rosuvastatin calcium) .... One by mouth mwf 5)  Nitrolingual 0.4 Mg/spray Tl Soln (Nitroglycerin) .... Use as directed 6)  Freestyle Test Strp (Glucose blood) .Marland Kitchen.. 1 once daily 7)  Metoprolol Tartrate 50 Mg Tabs (Metoprolol tartrate) .... One by mouth  two times a day 8)  Aspir-low 81 Mg Tbec (Aspirin) .... One by mouth daily 9)  Freestyle Lancets Misc (Lancets) .Marland Kitchen.. 1 once daily 10)  Zyrtec Hives Relief 10 Mg Tabs (Cetirizine hcl) .... As directed 11)  Imdur 60 Mg Xr24h-tab (Isosorbide mononitrate) .... Once daily 12)  Daily Vitamins For Women Tabs (Multiple vitamins-calcium) .... Once daily  Hypertension Assessment/Plan:      The patient's hypertensive risk group is category C: Target organ damage and/or diabetes.  Today's blood pressure is 140/78.  Her blood pressure goal is < 140/90.  Patient Instructions: 1)  Please schedule a follow-up appointment in  cpx in april   Orders Added: 1)  Est. Patient Level IV [46962]

## 2010-05-07 ENCOUNTER — Other Ambulatory Visit (INDEPENDENT_AMBULATORY_CARE_PROVIDER_SITE_OTHER): Payer: Medicare Other

## 2010-05-07 DIAGNOSIS — E785 Hyperlipidemia, unspecified: Secondary | ICD-10-CM

## 2010-05-07 DIAGNOSIS — Z0000 Encounter for general adult medical examination without abnormal findings: Secondary | ICD-10-CM

## 2010-05-07 DIAGNOSIS — I10 Essential (primary) hypertension: Secondary | ICD-10-CM

## 2010-05-07 LAB — POCT URINALYSIS DIPSTICK
Bilirubin, UA: NEGATIVE
Blood, UA: NEGATIVE
Glucose, UA: NEGATIVE
Ketones, UA: NEGATIVE
Leukocytes, UA: NEGATIVE
Nitrite, UA: NEGATIVE
Protein, UA: NEGATIVE
Spec Grav, UA: 1.02
Urobilinogen, UA: 0.2
pH, UA: 5.5

## 2010-05-07 LAB — CBC WITH DIFFERENTIAL/PLATELET
Basophils Absolute: 0 10*3/uL (ref 0.0–0.1)
Basophils Relative: 0.6 % (ref 0.0–3.0)
Eosinophils Absolute: 0.1 10*3/uL (ref 0.0–0.7)
Eosinophils Relative: 1.9 % (ref 0.0–5.0)
HCT: 35.6 % — ABNORMAL LOW (ref 36.0–46.0)
Hemoglobin: 12.1 g/dL (ref 12.0–15.0)
Lymphocytes Relative: 28.8 % (ref 12.0–46.0)
Lymphs Abs: 1.4 10*3/uL (ref 0.7–4.0)
MCHC: 34 g/dL (ref 30.0–36.0)
MCV: 78.6 fl (ref 78.0–100.0)
Monocytes Absolute: 0.3 10*3/uL (ref 0.1–1.0)
Monocytes Relative: 6.5 % (ref 3.0–12.0)
Neutro Abs: 3.1 10*3/uL (ref 1.4–7.7)
Neutrophils Relative %: 62.2 % (ref 43.0–77.0)
Platelets: 225 10*3/uL (ref 150.0–400.0)
RBC: 4.53 Mil/uL (ref 3.87–5.11)
RDW: 14.4 % (ref 11.5–14.6)
WBC: 5 10*3/uL (ref 4.5–10.5)

## 2010-05-08 LAB — BASIC METABOLIC PANEL
BUN: 16 mg/dL (ref 6–23)
CO2: 29 mEq/L (ref 19–32)
Calcium: 9.3 mg/dL (ref 8.4–10.5)
Chloride: 105 mEq/L (ref 96–112)
Creatinine, Ser: 0.8 mg/dL (ref 0.4–1.2)
GFR: 80.55 mL/min (ref >60.00)
Glucose, Bld: 83 mg/dL (ref 70–99)
Potassium: 4.5 mEq/L (ref 3.5–5.1)
Sodium: 143 mEq/L (ref 135–145)

## 2010-05-08 LAB — HEPATIC FUNCTION PANEL
ALT: 13 U/L (ref 0–35)
AST: 15 U/L (ref 0–37)
Albumin: 3.8 g/dL (ref 3.5–5.2)
Alkaline Phosphatase: 119 U/L — ABNORMAL HIGH (ref 39–117)
Bilirubin, Direct: 0.1 mg/dL (ref 0.0–0.3)
Total Bilirubin: 0.4 mg/dL (ref 0.3–1.2)
Total Protein: 6.2 g/dL (ref 6.0–8.3)

## 2010-05-08 LAB — LIPID PANEL
Cholesterol: 151 mg/dL (ref 0–200)
HDL: 47.9 mg/dL (ref >39.00)
LDL Cholesterol: 80 mg/dL (ref 0–99)
Total CHOL/HDL Ratio: 3
Triglycerides: 116 mg/dL (ref 0.0–149.0)
VLDL: 23.2 mg/dL (ref 0.0–40.0)

## 2010-05-08 LAB — TSH: TSH: 2.54 u[IU]/mL (ref 0.35–5.50)

## 2010-05-16 ENCOUNTER — Encounter: Payer: Self-pay | Admitting: Internal Medicine

## 2010-05-24 ENCOUNTER — Encounter: Payer: Self-pay | Admitting: Internal Medicine

## 2010-05-24 ENCOUNTER — Ambulatory Visit (INDEPENDENT_AMBULATORY_CARE_PROVIDER_SITE_OTHER): Payer: Medicare Other | Admitting: Internal Medicine

## 2010-05-24 VITALS — BP 138/80 | HR 72 | Temp 98.2°F | Resp 16 | Ht 64.0 in | Wt 194.0 lb

## 2010-05-24 DIAGNOSIS — I10 Essential (primary) hypertension: Secondary | ICD-10-CM

## 2010-05-24 DIAGNOSIS — F17200 Nicotine dependence, unspecified, uncomplicated: Secondary | ICD-10-CM

## 2010-05-24 DIAGNOSIS — E785 Hyperlipidemia, unspecified: Secondary | ICD-10-CM

## 2010-05-24 DIAGNOSIS — I2510 Atherosclerotic heart disease of native coronary artery without angina pectoris: Secondary | ICD-10-CM

## 2010-05-24 DIAGNOSIS — Z0000 Encounter for general adult medical examination without abnormal findings: Secondary | ICD-10-CM

## 2010-05-24 NOTE — Assessment & Plan Note (Signed)
The patient has resumed smoking and is not in the contemplative stage he had poor quitting smoking she needs to understand that her cardiovascular disease he shortened equally for every moment she smokes at time of her life statistically.

## 2010-05-24 NOTE — Assessment & Plan Note (Signed)
Has a history of coronary artery disease at age 65 she really needs to contemplate smoking cessation and better blood pressure control she has improved her diet significantly but she needs to complete the picture

## 2010-05-24 NOTE — Progress Notes (Signed)
Subjective:    Patient ID: Tricia [name], female    DOB: March 04, 1945, 65 y.o.   MRN: [id number]  HPI the patient is a pleasant 65 year old white female who is doing well and especially proud of her cholesterol and her triglycerides dropped because of her dietary compliance she began smoking he can.  Her weight is stable but her exercise is not at the desired level she is compliant with her medications.    Review of Systems  Constitutional: Negative for activity change, appetite change and fatigue.  HENT: Negative for ear pain, congestion, neck pain, postnasal drip and sinus pressure.   Eyes: Negative for redness and visual disturbance.  Respiratory: Negative for cough, shortness of breath and wheezing.   Gastrointestinal: Negative for abdominal pain and abdominal distention.  Genitourinary: Negative for dysuria, frequency and menstrual problem.  Musculoskeletal: Negative for myalgias, joint swelling and arthralgias.  Skin: Negative for rash and wound.  Neurological: Negative for dizziness, weakness and headaches.  Hematological: Negative for adenopathy. Does not bruise/bleed easily.  Psychiatric/Behavioral: Negative for sleep disturbance and decreased concentration.   Past Medical History  Diagnosis Date  . CAD (coronary artery disease)   . Hypertension   . Hyperlipidemia   . Obesity   . Anxiety   . Sleep apnea   . Diabetes mellitus   . Dermatophytosis of groin and perianal area   . Anemia   . B12 deficiency   . Osteoporosis   . Depression   . Arthritis   . Low back pain     l5 disc  . Diverticulosis    Past Surgical History  Procedure Date  . Abdominal hysterectomy   . Bariatric surgery   . Coronary angioplasty 2002    2 times  . Appendectomy   . Carpal tunnel release   . Hernia repair   . Tonsillectomy   . Ulnar nerve release   . Total knee arthroplasty   . Nephrectomy   . Lumbar laminectomy     l3-4  . Cholecystectomy   . Tubal ligation     reports that  she has been smoking.  She does not have any smokeless tobacco history on file. She reports that she does not drink alcohol or use illicit drugs. family history includes Colitis in her mother; Coronary artery disease in an unspecified family member; and Heart disease in her father. Allergies  Allergen Reactions  . Sulfamethoxazole W/Trimethoprim     REACTION: Reaction not known  . Sulfonamide Derivatives        Objective:   Physical Exam  Constitutional: She is oriented to person, place, and time. She appears well-developed and well-nourished. No distress.  HENT:  Head: Normocephalic and atraumatic.  Right Ear: External ear normal.  Left Ear: External ear normal.  Nose: Nose normal.  Mouth/Throat: Oropharynx is clear and moist.  Eyes: Conjunctivae and EOM are normal. Pupils are equal, round, and reactive to light.  Neck: Normal range of motion. Neck supple. No JVD present. No tracheal deviation present. No thyromegaly present.  Cardiovascular: Normal rate, regular rhythm, normal heart sounds and intact distal pulses.   No murmur heard. Pulmonary/Chest: Effort normal and breath sounds normal. She has no wheezes. She exhibits no tenderness.  Abdominal: Soft. Bowel sounds are normal.  Musculoskeletal: Normal range of motion. She exhibits no edema and no tenderness.  Lymphadenopathy:    She has no cervical adenopathy.  Neurological: She is alert and oriented to person, place, and time. She has normal reflexes. No cranial nerve  deficit.  Skin: Skin is warm and dry. She is not diaphoretic.  Psychiatric: She has a normal mood and affect. Her behavior is normal.          Assessment & Plan:   This is a routine physical examination for this healthy  Female. Reviewed all health maintenance protocols including mammography colonoscopy bone density and reviewed appropriate screening labs. Her immunization history was reviewed as well as her current medications and allergies refills of her  chronic medications were given and the plan for yearly health maintenance was discussed all orders and referrals were made as appropriate. She'll require a bone density next year she had mammography several months prior to this visit  Part of the visit today was in counseling for tobacco cessation as well as recognition of its effect on her coronary disease her hypertension and hyperlipidemia the modified her diet at the last visit which resulted in decreased triglycerides.

## 2010-05-24 NOTE — Assessment & Plan Note (Signed)
His acceptable blood pressure level but I believe the nicotine abuse does elevate her blood pressure and it would be better to have her blood pressure closer to 120 to 1:30 systolic

## 2010-06-07 ENCOUNTER — Other Ambulatory Visit: Payer: Self-pay | Admitting: Internal Medicine

## 2010-06-07 MED ORDER — HYDROCODONE-ACETAMINOPHEN 5-500 MG PO TABS: 1.0000 | ORAL_TABLET | Freq: Four times a day (QID) | ORAL | Status: DC | PRN

## 2010-06-21 NOTE — Cardiovascular Report (Signed)
[place]. [facility]  Patient:    Tricia [name], Tricia [name]                       MRN: [id number] Proc. Date: 08/18/00 Adm. Date:  [id number] Attending:  Ronaldo Miyamoto CC:         Madolyn Frieze. Jens Som, M.D. [facility]  CV Laboratory  Stacie Glaze, M.D. [facility]   Cardiac Catheterization  INDICATIONS:  The patient is a 65 year old, white female, well known to [place]. She previously had stenting of the left anterior descending artery.  She has developed recurrent angina, although this has been improved with isosorbide mononitrate.  She had had a stent placed in the LAD with resolution of ischemia by Cardiolite originally, but with recurrent angina she underwent repeat catheterization.  This revealed partial in-stent re-stenosis with about 70-75% narrowing in the proximal aspect of the stent.  Distally, there remained about a 70-75% focal stenosis beyond the second major diagonal vessel, and I reviewed the films with Dr. Charlies Constable in detail.  It was our feeling that we should re-dilate the stent and place a stent in the distal vessel.  I discussed all these options with the patient.  We talked about the possibility of radiation therapy, but given the incomplete re-stenosis, and the somewhat smaller location we elected to forego radiation therapy.  She was brought to the catheterization lab for further evaluation.  PROCEDURES: 1. Percutaneous stenting of the distal left anterior descending artery. 2. Percutaneous repeat balloon dilatation of the proximal to mid    left anterior descending at the stent site.  DESCRIPTION OF PROCEDURE:  The patient was brought to the catheterization lab and prepped and draped in the usual fashion after informed consent.  Using an anterior puncture with a SmartNeedle, the left femoral artery was easily entered.  Location appeared to be excellent.  A 7 French sheath was placed. Heparin and Integrilin were given according to protocol.  A JL4  guiding catheter was utilized and this was utilized previously.  A 0.014 Hi-Torque Floppy wire was fashioned into the LAD.  The area of re-stenosis in the stent was then dilated using a 15 mm length 3.25 Cutting Balloon.  There was fairly marked improvement in the appearance of the artery at the site of the Cutting Balloon.  The vessel tapered somewhat distally and we elected not to dilate these areas.  The distal vessel had a focal stenosis which was then stented using a 2.75 mm Guidant stent, 13 mm in length.  The center of the stent did not fully dilate despite 10 atmospheres of inflation and because of this, we placed a 9 mm length 2.75 Quantum Ranger balloon, and this was taken up to about 14 atmospheres with complete relief of the residual stenosis.  There was marked improvement in the appearance of this vessel.  After careful analysis we decided to complete the procedure.  She was taken to the holding area in satisfactory clinical condition after sewing the sheath into place.  HEMODYNAMIC DATA:  Central aortic pressure was 109/60.  ANGIOGRAPHIC DATA:  The left main coronary is free of critical disease.  The LAD is patent up to the site of the septal perforator.  The septal perforator itself has 50-70% narrowing.  Just beyond the septal perforator is a progressive area of about 70-75% narrowing of the previously placed stent. Distal to this location, this stent tapers somewhat to about 40-50%. Distally, there is about a 70% stenosis  beyond the second diagonal branch. The proximal stent site was dilated with residual stenosis of approximately 30%.  The 40-50% distal area was not redilated.  The 70% stenosis after the second diagonal was stented and this was reduced from 75% down to 0% with an excellent angiographic appearance.  There was TIMI-3 flow throughout the LAD vessel.  The angiographic studies were reviewed in detail with the patients family.  CONCLUSIONS: 1. Successful  percutaneous stenting of the distal left anterior descending. 2. Successful repeat balloon dilatation using the Cutting Balloon in the    proximal aspect of the previously placed left anterior descending stent.  DISPOSITION:  The patient will be treated with aspirin and Plavix.  Continued medical therapy will be warranted.  Risk factor reduction will be approached. DD:  08/18/00 TD:  08/19/00 Job: [id number]

## 2010-06-21 NOTE — Op Note (Signed)
NAMETARESA, MONTVILLE                ACCOUNT NO.:  [id number]   MEDICAL RECORD NO.:  [id number]          PATIENT TYPE:  AMB   LOCATION:  DSC                          FACILITY:  [facility]   PHYSICIAN:  Robert A. Thurston Hole, M.D. DATE OF BIRTH:  [birth year]   DATE OF PROCEDURE:  02/11/2005  DATE OF DISCHARGE:                                 OPERATIVE REPORT   PREOPERATIVE DIAGNOSES:  1.  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  2.  Left knee loose bodies.   POSTOPERATIVE DIAGNOSIS:  1.  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  2.  Left knee loose bodies.   PROCEDURE:  1.  Left knee EUA followed by arthroscopic partial medial and lateral      meniscectomies.  2.  Left knee chondroplasty with partial synovectomy.  3.  Left knee loose body excision.   SURGEON:  Dr. Salvatore Marvel.   ASSISTANT:  Kirstin Tomasa Rand, P.A.   ANESTHESIA:  General.   OPERATIVE TIME:  30 minutes.   COMPLICATIONS:  None.   INDICATIONS FOR PROCEDURE:  Ms. Tatlock is a 65 year old woman who has had 6  months of increasing left knee pain with exam and MRI documenting medial and  lateral meniscal tears with chondromalacia and loose bodies who has failed  conservative care and is now to undergo arthroscopy.   DESCRIPTION:  Ms. Heinzel was brought to the operating room on February 11, 2005 after a knee block had been placed in the holding room by anesthesia.  She was placed on the operating table in supine position. Her left knee was  examined. Range of motion from 0 to 125 degrees 1 to 2+ crepitation.  Knee  stable to ligamentous exam with normal patellar tracking. Left leg was  prepped using sterile DuraPrep and draped using sterile technique.  Originally through an anterolateral portal, the arthroscope with a pump  attached was placed and through an anteromedial portal an arthroscopic probe  was placed. 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 Medial meniscal  remnant showed further tearing 20% of the posterior horn and medial horn was  intact but another 50% was torn and this was debrided back to stable rim.  Intercondylar notch inspected. Anterior and posterior cruciate ligaments  were normal. 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 Lateral  meniscus tear posterior and lateral horn 30-40% was resected back to a  stable rim. 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 The patella tracked normally. Significant synovitis in the medial  and lateral gutters were debrided. There was a large anteromedial osteophyte  off the patella which was resected with a 6 mm bur. Medial and lateral  gutters were otherwise free of pathology. After this was done, it was felt  that all pathology had been satisfactorily addressed. The instruments were  removed. Portals closed with  3-0 nylon suture and injected with 0.25%  Marcaine with epinephrine and 4 mg of morphine. Sterile dressings applied  and the patient awakened and taken to recovery in stable condition.   FOLLOW-UP CARE:  Ms. Sasaki will be followed as an outpatient on Percocet for  pain. She her back in the office in a week for sutures out and follow-up.      Robert A. Thurston Hole, M.D.  Electronically Signed     RAW/MEDQ  D:  02/11/2005  T:  02/12/2005  Job:  [id number]

## 2010-06-21 NOTE — Discharge Summary (Signed)
NAMECHRISHAWNA, FARINA                ACCOUNT NO.:  [id number]   MEDICAL RECORD NO.:  [id number]          PATIENT TYPE:  INP   LOCATION:  5037                         FACILITY:  [facility]   PHYSICIAN:  Elana Alm. Thurston Hole, M.D. DATE OF BIRTH:  05-26-1945   DATE OF ADMISSION:  09/15/2005  DATE OF DISCHARGE:  09/18/2005                                 DISCHARGE SUMMARY   ADMISSION DIAGNOSES:  1. End-stage degenerative joint disease left knee.  2. Coronary artery disease.  3. Hypertension.  4. Anxiety.  5. Lumbosacral degenerative disk disease with chronic back pain.  6. Obesity, status post gastric bypass.   HISTORY OF PRESENT ILLNESS:  The patient is a 65 year old white female who  has end-stage DJD of her left knee.  She has failed conservative care  including anti-inflammatory course and injections and ________ arthroscopy.  We have discussed the risks, benefits, and possible complications of a left  total knee replacement and she is without question.   Procedures in house on 09/15/2005:  The patient underwent a left total knee  replacement by Dr. Thurston Hole and a femoral nerve block by anesthesia. She  tolerated both procedures well.   Postoperative day one, the patient had some difficulty with pain control.  She had no shortness of breath and no chest pain, slight bit of numbness and  paresthesias in her foot, that she attributed to her lumbosacral  degenerative disk disease. No calf pain, no bowel movement.  Vital signs  were stable with O2 saturation  of 92% on 3 liters.  Hemoglobin 11.4. INR  1.1.  She was alert and oriented times three.  Her wound had a moderate  amount of sanguineous drainage from it.  Her calf was soft.  Her heart had a  regular, rate and rhythm.  Her lungs were clear with some decreased breath  sounds.  Abdomen:  She did have positive bowel sounds but was not passing  gas.  She tolerated the CPM 0-90 degrees.  She had brisk capillary refill in  her left lower  extremity.  FHL and EHL were intact.  Physical therapy was  limited by nausea.  She only ambulated a slight distance.  Case management  and social work was consulted for skilled nursing facility placement.   Postoperative day two, the patient was doing better. She was T-max at 99,  pulse was slightly high and tachycardiac at 105.  Her blood pressure was  slightly high at 156/94, her O2 saturation  was 97% on 2 liters, dropped  down to below 90% off O2.  Hemoglobin 10.4, she was metabolically stable.  Her CBGs were stable between 103 and 133, and her INR was 1.9.  She was  alert and oriented.  She had a small amount of serosanguineous drainage on  her dressing.  Her calf was soft.  Her heart had a regular, rate and rhythm.  Her lungs were clear.  She did have positive  bowel sounds but was not  passing gas.  Her CPM was 0-60 degrees.  Capillary refill was less than 2  seconds.  All sensation was  intact in her lower extremity.  Her numbness had  improved in her foot.  She had good motor function with FHL and EHL.  On  postoperative day two, due to the tachycardia and hypertension we increased  her Toprol XL to 75 mg p.o. twice daily.  She was given Milk of Magnesia for  constipation. She tolerated this well.   Postoperative day three, she has minimal pain.  She has no chest pain and no  shortness of breath.  No numbness or paresthesias and no calf pain.  She is  tolerating her diet.  She has not had a bowel movement; therefore, was given  a bisacodyl suppository.  She has no nausea today.  She is afebrile.  Her  pulse is down to 76.  Blood pressure 137/75, O2 saturation  96% on room air.  Hemoglobin 9.7.  She is metabolically stable, and  her INR is 2.2.  She is  alert and oriented times there.  Her wound is clean with a small amount of  sanguinous drainage.  Her calf is soft.  She has a regular, rate and rhythm.  Her lungs are clear.  She has normal bowel sounds.  She is passing gas.  She   has brisk capillary refill in her left lower extremity.  All sensation is  intact.  EHL and FHL are moving.  She tolerated the CPM 0-90 degrees today  for eight hours.  She ambulated with physical therapy 300 feet.  She is  being discharged to a skilled nursing facility in stable condition.  Weight  bearing as tolerated.  On a carbohydrate modified medium diet with 60 grams  of carbohydrates.   DISCHARGE MEDICATIONS:  1. Coumadin 4 mg p.o. adjusting accordingly to main her INR between 2.0      and 3.0.  2. Colace 100 mg one p.o. twice daily.  3. Senokot 1 tablet 1/2 hour before meals twice a day.  4. Benicar 40 mg one tablet daily.  5. Hydrochlorothiazide 25 mg one tablet daily.  6. Vitamin D 1000 unit daily.  7. Wellbutrin 200 mg twice a day.  8. Multivitamins daily.  9. Vytorin 10/40 daily.  10.Isosorbide mono 60 mg daily.  11.Toprol XL 75 mg twice daily.  12.OXY-IR 5-10 mg p.o. every three hours p.r.n. pain.  13.Robaxin 500 mg p.o. every six hours p.r.n. muscle spasm.  14.Restoril 15 to 30 mg p.o. at bedtime p.r.n. sleep.  15.Nitroglycerin 0.4 mg sublingual p.r.n. chest pain.  16.Xanax 0.25 mg 1 p.o. at bedtime p.r.n.   FOLLOWUP:  Dr. Thurston Hole on 09/29/2005 to have her x-rays done and a wound  check.  Office number [phone], please call to schedule an appointment.  She  is to use her CPM 0-90 degrees eight hours a day for the next two weeks.  She is to elevate her left heel on a folded pillow 30 minutes twice a day to  work on extension.  She is to get physical therapy daily for ambulation.  She is to get occupational therapy daily for  ADLs.  She will be on Coumadin  for four weeks postoperatively, stopping on 10/13/2005 for DVT prophylaxis.  She will also need to wear thigh-high TED hose on both legs during the day.  They are to be removed nightly.  She is to have daily dressing changes on  her left knee.      Kirstin Shepperson, P.A.      Robert A. Thurston Hole, M.D.   Electronically Signed  KS/MEDQ  D:  09/18/2005  T:  09/18/2005  Job:  [id number]

## 2010-06-21 NOTE — H&P (Signed)
NAME:  Tricia [name], Tricia [name]                          ACCOUNT NO.:  [id number]   MEDICAL RECORD NO.:  [id number]                   PATIENT TYPE:  INP   LOCATION:  3012                                 FACILITY:  [facility]   PHYSICIAN:  Hilda Lias, M.D.                DATE OF BIRTH:  [birth year]   DATE OF ADMISSION:  06/27/2003  DATE OF DISCHARGE:                                HISTORY & PHYSICAL   Ms. Cichowski is a lady who I have been seeing in my office for many years.  At  the beginning, the main problem was back pain with radiation down to both  legs, and she ended up having a workup which showed spondylolisthesis.  Nevertheless, we encouraged her lose weight.  She ended up having a gastric  bypass and all-in-all she had lost quite a bit of weight.  Now, she has come  into the office complaining of neck pain.  The pain is having radiation down  into the upper extremity with the burden of it in the left hand and also in  the left foot.  The pain continues.  Despite exercises, she is not any  better.  She had been unable to do any type of swimming or walking.  The  patient had a cervical x-ray and because of the findings she is being  admitted for surgery.   PAST MEDICAL HISTORY:  1. Gastric bypass surgery for weight loss.  2. Appendectomy.  3. Knee surgery.  4. Carpal tunnel surgery.  5. Ulnar decompression, twice.  6. Cholecystectomy.  7. Hysterectomy.  8. Inguinal hernia.   ALLERGIES:  She is not allergic to any medications.   She does not drink or smoke.   FAMILY HISTORY:  I did surgery on her mother and she is doing really well.  There is a history of diabetes, heart disease.   PHYSICAL EXAMINATION:  GENERAL:  The patient came to my office with  complaints of neck pain and lower back pain.  HEENT:  Normal.  NECK:  Able to flex and extend but lateralization causes discomfort going to  both shoulders.  LUNGS:  Clear.  HEART:  Sounds normal.  ABDOMEN:  Soft.  EXTREMITIES:   Normal.  NEURO:  Mental status normal.  Cranial nerves:  Normal.  Strength:  She has  weakness of both biceps.  There is a decrease of the biceps reflex.  Coordination and gait:  Normal.  In the lower extremities, she has straight-  leg raise which is positive bilaterally.   X-ray showed that she has a herniated disk at the level of 5-6.   CLINICAL IMPRESSION:  1. C5-C6 herniated disk with C6 radiculopathy.  2. History of lumbar spondylolisthesis.   RECOMMENDATIONS:  The patient is being admitted for surgery.   PROCEDURE:  Anterior cervical diskectomy at the level of C5-C6, followed by  fusion.  The risks were fully explained to her  including the possibility of  damage to the vocal cords, no improvement whatsoever, damage to the  esophagus, and no relationship to her lower back pain.                                                Hilda Lias, M.D.    EB/MEDQ  D:  06/27/2003  T:  06/27/2003  Job:  [id number]

## 2010-06-21 NOTE — H&P (Signed)
NAME:  Tricia [name], Tricia [name]                          ACCOUNT NO.:  [id number]   MEDICAL RECORD NO.:  [id number]                   PATIENT TYPE:  INP   LOCATION:  1823                                 FACILITY:  [facility]   PHYSICIAN:  Vida Roller, M.D.                DATE OF BIRTH:  14-Nov-1945   DATE OF ADMISSION:  01/27/2003  DATE OF DISCHARGE:                                HISTORY & PHYSICAL   PHYSICIANS:  Primary care, Stacie Glaze, M.D.  Cardiologist, Olga Millers, M.D.   HISTORY OF PRESENT ILLNESS:  Mrs. Rondeau is a 65 year old female who has a  history of coronary artery disease, most recently a stent placed in her left  anterior descending coronary artery in July 2002.  She was doing well until  this afternoon when she had the sudden onset of discomfort in her chest  while sitting and talking with her husband.  Discomfort was very typical of  her previous angina.  She took nitroglycerin spray without any significant  relief.  It was primarily in the left side of her chest.  It radiated into  her back. She had significant relief when she came into the emergency  department and got IV nitroglycerin.  She has no shortness of breath, no  radiation to the pain beyond that going to the back.  No palpitations, no  diaphoresis, no nausea and vomiting.  She says the pain was similar in  characteristic to her previous angina but much, much more severe.  She had  been well since; however, she two weeks ago had a relatively long plane  flight from [place] to here but has no leg pain and no associated problems.   PAST MEDICAL HISTORY:  1. Coronary artery disease with evidence of a stent placed in her coronary     artery in September 2001 with in-stent restenosis requiring work and then     also an additional stent in the diagonal in July 2002.  2. Obstructive sleep apnea in the past.  3. Hyperlipidemia.  4. Hypertension.  5. Diabetes mellitus.  6. Peptic ulcer disease.  7. She had morbid  obesity.  She is status post gastric bypass with     significant weight loss over the last 14 months.  8. DJD in her back.   MEDICATIONS:  1. Plavix 75 mg a day.  2. Imdur 60 mg a day.  3. Benecol 40 and 12.5 mg once a day.  4. Lipitor 80 mg at night.  5. Prevacid 30 mg a day.  6. K-Dur 20 mEq a day.  7. Wellbutrin XL 30 mg a day.  8. Hydrocodone 5/500 mg once a day.  9. Xanax 0.25 mg p.r.n.  10.      Skelaxin 800 mg once a day.  11.      Vitamins, calcium, B12 injections.  12.      Toprol p.r.n. for tachycardia.  SOCIAL HISTORY:  She is married.  She lives in [place].  She has two  children.  She quit smoking about five years ago but has about a 75-pack-  year smoking history.  She rarely drinks alcohol.  She is disabled from her  DJD and coronary disease.  Previously worked at [employer].   FAMILY HISTORY:  Father died of coronary disease, age 19.  Mother is alive  at age 71 but has a stent and atrial fibrillation.  Her children are  healthy.   REVIEW OF SYSTEMS:  Essentially unremarkable.   PHYSICAL EXAMINATION:  GENERAL:  She is a well-developed, well-nourished  white female in no apparent distress.  Alert and oriented x 4.  VITAL SIGNS:  She is afebrile.  Respiratory rate is 12, pulse 65 and  regular, blood pressure 147/79.  HEENT:  Exam is unremarkable.  NECK:  Supple.  There is no jugular venous distention or carotid bruits.  CHEST:  Clear to auscultation.  HEART:  Regular rate and rhythm with no murmurs, rubs, or gallops.  ABDOMEN:  Soft, nontender with normoactive bowel sounds.  GU/RECTAL:  Exams deferred.  EXTREMITIES:  Pulses intact without any edema.  BREASTS:  Complete breast exam is deferred.   LABORATORY DATA:  Chest x-ray shows cardiomegaly with minimal opacity in the  left mid lung area which is of unknown duration.   EKG shows sinus rhythm at a rate of 84 with normal intervals, normal axes,  no ST-T wave changes concerning for ischemia.  No Q  wave concerning for an  old myocardial infarction.   Her laboratories, just bedside show a myoglobin of 71.8, CK-MB less than  0.1.  Troponin less than 0.05.   IMPRESSION:  We have a lady with chest discomfort with a history of coronary  disease who also has a history of pleuritic component chest pain with a  recent long plane flight.  So the two leading diagnoses are coronary artery  disease, which she has well established, and the angina associated with that  and also a pulmonary embolus.   PLAN:  1. Treat her aggressively and anticoagulate her with heparin as per pharmacy     protocol.  2. Get a CT scan and a D-dimer.  We will get a routine set of her     laboratories.  3. I think it is also important that we rule her our for myocardial     infarction.  If the CT scan and D-dimer are not convincing for pulmonary     embolus, that she undergo an noninvasive perfusion scan.                                                Vida Roller, M.D.    JH/MEDQ  D:  01/27/2003  T:  01/29/2003  Job:  [id number]   cc:   Stacie Glaze, M.D. [facility]   [place], [place].D.

## 2010-06-21 NOTE — Cardiovascular Report (Signed)
[place]  Patient:    Tricia [name], Tricia [name]                       MRN: [id number] Proc. Date: 10/25/99 Adm. Date:  [id number] Disc. Date: [id number] Attending:  Lewayne Bunting CC:         Stacie Glaze, M.D. [facility]  Doylene Canning. Ladona Ridgel, M.D. [facility]  Cardiac Catheterization Lab   Cardiac Catheterization  INDICATIONS:  Ms. Dick is a pleasant 65 year old white female who presents with a history of progressive angina.  This has been going on for a two-week period.  She was seen by Dr. Lovell Sheehan and referred for a diagnostic stress test.  This revealed a large anteroapical defect, and she was seen by Dr. Ladona Ridgel in the office and set up for urgent catheterization.  She had recurrent chest pain in the emergency room and was subsequently brought to the catheterization lab for further evaluation.  Risks, benefits, and alternatives were discussed with the patient in the emergency room.  PROCEDURE: 1. Left heart catheterization. 2. Selective coronary arteriography. 3. Selective left ventriculography. 4. Percutaneous transluminal coronary angioplasty and stenting of the left    anterior descending artery.  EQUIPMENT:  A JL4 guiding catheter, a 0.014 Hi-Torque Floppy wire, a 3.25 Quantum Ranger balloon, a 3.0 x 20-mm Tetra stent, a 3.5 x 15-mm Quantum Ranger balloon.  DESCRIPTION OF PROCEDURE:  The patient was brought to the catheterization lab and prepped and draped in the usual fashion.  Xylocaine, 1%, was used for local anesthesia.  Through an anterior puncture, the right femoral artery was easily entered.  A 6-French sheath was placed.  Views of the left and right coronary arteries were obtained in multiple angiographic projections. Ventriculography was performed in the RAO projection.  At this point, it was felt that percutaneous intervention would be the best option, and I went out and spoke with the family.  I also spoke with the patient.  We discussed  the possible risks.  Following this, a JL4.5 guiding catheter was initially placed but did not seat well in the left main.  This was then exchanged over a wire for a JL4 guiding catheter.  This fit well into the left main.  A 0.014 Hi-Torque Floppy wire was used.  The patient had been heparinized and given Integrilin.  With an appropriate ACT, the lesion was initially dilated using a 3.25-mm Quantum Ranger balloon taken up to about six atmospheres.  This was then removed, and the long area was stented using a 3.0 x 28-mm Tetra stent. This was taken to about 9-10 atmospheres.  This was felt to be slightly oversized for the distal vessel and perhaps slightly undersized for the more proximal vessel.  The stent inside the margins was then post dilated using a 3.5-mm Quantum Ranger balloon taken up to about 15 atmospheres.  There was marked improvement in the appearance of the artery.  We elected not to treat the distal most lesion of 60-70% noted beyond the origin of the third diagonal.  There was a small first diagonal that was less than a millimeter in size, and this was occluded with the stenting.  The patient tolerated the procedure reasonably well although she had a fair amount of chest pain that was subsequently relieved with doses of intracoronary nitroglycerin, and intracoronary Verapamil were also administered.  With an adequate result, the catheters were all removed.  The femoral sheath was sewn into place.  The patient  was taken to the holding area in satisfactory clinical condition.  HEMODYNAMIC DATA:  The initial central aortic pressure was 182/95.  The left ventricular pressure was 186/30.  No gradient was recorded on pullback across the aortic valve.  ANGIOGRAPHIC DATA: 1. The left main coronary artery was free of significant disease. 2. The left anterior descending artery coursed to the apex.  There was about a    95% sub total stenosis just beyond the first septal  perforator.  The    septal itself had about 50% proximal narrowing.  There was a tiny first    diagonal also at this location that had a 90% ostial narrowing.  The LAD    had a 70% narrowing prior to the origin of a larger second diagonal    branch.  There was a 60-70% distal stenosis.  The 95% and 70% stenosis were    successfully stented with 0% residual narrowing. 3. The circumflex provided a first small marginal branch, then two subsequent    marginal branches.  The circumflex itself was free of critical disease. 4. The right coronary artery was a smaller caliber vessel that had moderate    irregularity in the proximal and mid vessel.  There was about a 40% area    near the acute margin.  The posterior descending and posterolateral system    were without critical disease.  Ventriculography in the RAO projection reveals hypokinesis to akinesis of the apical segment.  Overall, LV function is preserved with an estimate of ejection fraction in the range of about 50%.  CONCLUSIONS: 1. Preserved overall LV function with a wall motion abnormality involving the    distal LAD distribution. 2. High grade stenosis of the left anterior descending artery with successful    stenting as described in the above text. 3. Residual 60-70% LAD stenosis in the distal vessel, not treated. 4. Moderate plaquing of the right coronary artery, as noted above.  DISPOSITION:  The patient will be treated medically.  Aspirin and Plavix will be continued.  She will need Plavix for four weeks.  I have explained to the family the need for antibiotics should she have dental work for the next two months.  She will be enrolled in cardiac rehab, both Phase I and Phase II. DD:  10/25/99 TD:  10/25/99 Job: [id number]

## 2010-06-21 NOTE — Discharge Summary (Signed)
[place]  Patient:    Tricia [name], Tricia [name] Visit Number: [id number] MRN: [id number]          Service Type: CAT Location: [facility] 2899 10 Attending Physician:  Ronaldo Miyamoto Dictated by:   Jacolyn Reedy, P.A.C. Admit Date:  07/05/2001 Disc. Date: 07/06/01   CC:         Tricia [name], M.D. [facility]   Referring Physician Discharge [place]  ADMISSION DIAGNOSIS:  Abnormal Cardiolite.  DISCHARGE DIAGNOSES: 1. Coronary artery disease, status post percutaneous transluminal coronary    angioplasty of the left anterior descending and stenting of the diagonal in    July of 2002.  A cardiac catheterization in July 05, 2001, showed no    restenosis. 2. Morbid obesity. 3. History of sleep apnea. 4. Hyperlipidemia. 5. Hypertension. 6. Diabetes mellitus. 7. Chronic back pain.  Will eventually need surgery.  HISTORY OF PRESENT ILLNESS:  Please see the H&P for details.  [name] is a 65 year old white female with a history of coronary artery disease, status post PTCA of the LAD and stenting of the diagonal back in July of 2002.  She had a negative Cardiolite scan in August of 2002.  The patient needs back surgery, but was told that she needed to lose weight first and is therefore scheduled for gastric stapling.  She underwent a Cardiolite scan prior to this and nuclear images showed a very slight suggestion of very localized reversibility in the mid anterior wall, but they said that this was a borderline call.  The ejection fraction was 66%.  Because of this, cardiac catheterization was recommended.  HOSPITAL COURSE:  The patient underwent left heart catheterization by Arturo Morton. Riley Kill, M.D., on July 05, 2001.  She was initially scheduled for Jul 02, 2001, but her BUN was 35 and creatinine 1.4.  Her diuretic was held before undergoing catheterization on July 05, 2001.  This revealed 30% LAD, 40-50% mid LAD, 30% RCA, and 30-40% distal RCA.  An left  ventriculogram was not done. The patient tolerated the procedure well.  The groin was stable without hematoma or hemorrhage.  She has good distal pulses.  LABORATORY DATA:  Hemoglobin 14, hematocrit 41, white count 6.3.  INR 0.9. Sodium 141, potassium 4.8, chloride 105, CO2 30, BUN 26, creatinine 1.3.  EKG with normal sinus rhythm and no acute EKG changes.  DISPOSITION:  The patient was discharged home in stable condition on the following medications.  DISCHARGE MEDICATIONS:  1. Metoprolol 50 mg b.i.d.  2. Lexapro 10 mg q.d.  3. Imdur 30 mg q.d.  4. Pravachol 80 mg q.d.  5. Prevacid 30 mg q.d.  6. Tricor 160 mg q.d.  7. Actos 15 mg q.d.  8. Hyzaar 100/25 mg q.d.  9. Potassium 20 mEq q.d. 10. Aspirin q.d. 11. Plavix 75 mg q.d. 12. Arthrotec 75 mg b.i.d.  ACTIVITY:  She is to do no strenuous activity for two to three days.  DIET:  Follow a low-salt, low-fat diet.  FOLLOW-UP:  She will follow up with the P.A. in Dr. Madolyn Frieze. [place] office on July 21, 2001, at 12 p.m.  She will see Tricia [name], M.D., for further scheduling of her gastric sampling surgery. Dictated by:   Jacolyn Reedy, P.A.C. Attending Physician:  Ronaldo Miyamoto DD:  07/06/01 TD:  07/06/01 Job: [id number]

## 2010-06-21 NOTE — Cardiovascular Report (Signed)
[place]. [facility]  Patient:    Tricia [name], BAEZ Visit Number: [id number] MRN: [id number]          Service Type: CAT Location: [id number] Attending Physician:  Ronaldo Miyamoto Dictated by:   Arturo Morton Riley Kill, M.D. [facility] Proc. Date: 07/05/01 Admit Date:  07/05/2001   CC:         Madolyn Frieze. Jens Som, M.D. [facility]  Cardiac Catheterization Lab  Stacie Glaze, M.D. [facility]   Cardiac Catheterization  INDICATIONS:  The patient is a 65 year old female who has had prior percutaneous coronary intervention.  She wants to have both back surgery as well as gastric reduction surgery.  The current study was done to assess coronary anatomy after a Cardiolite suggested very mild anterior ischemia.  PROCEDURES: 1. Left heart catheterization. 2. Coronary arteriography.  DESCRIPTION OF PROCEDURE:  The patient had had an elevated BUN and creatinine. The diuretics were held.  She was given oral fluids over the weekend and intravenous fluids today.  She was given Mucomyst earlier.  The current study was done to assess coronary anatomy.  The procedure was performed from the right femoral artery using a #6 [place] sheath and #6 French catheters. Coronary arteriography was performed without ventriculography.  Left heart pressures were measured.  The patient was taken to the holding area in satisfactory clinical condition.  HEMODYNAMIC DATA: 1. Central aorta 165/84. 2. Left ventricle 166/12. 3. No gradient on pullback across the aortic valve.  ANGIOGRAPHIC DATA: 1. The left main coronary artery was free of critical disease.  2. The left anterior descending artery coursed to the apex.  In the area of    the previous stent placement which was a long area in the mid LAD, there    was about 30% narrowing.  This encompasses the origin of the first septal    perforator.  Following this, the vessel opens back up and has typical    diabetic changes and luminal irregularities  but no high-grade area of    stenosis.  In the distal vessel at the previous site of stenting, there is    about 40-50% eccentric smooth stenosis but it does not appear to be    high-grade.  The distal vessel has a typical diabetic appearance.  3. The circumflex artery proper provides two marginal branches.  There are    minor luminal irregularities but no critical stenoses.  4. The right coronary artery demonstrates a dominant vessel with the PDA and    two posterolateral branches.  In the proximal vessel, there is about 30%    narrowing which is viewed in three different views.  There is a 20% mid    narrowing and a 30-40% narrowing at the acute margin.  No high-grade areas    of stenosis were noted.  CONCLUSIONS:  No evidence of high-grade restenosis in the previously placed proximal stent with other findings as noted above.  PLAN:  Follow up with Dr. Jens Som.  We would not recommend revascularization prior to surgery as she appears to be doing well. Dictated by:   Arturo Morton Riley Kill, M.D. [facility] Attending Physician:  Ronaldo Miyamoto DD:  07/05/01 TD:  07/06/01 Job: [id number]

## 2010-06-21 NOTE — Cardiovascular Report (Signed)
[place]. [facility]  Patient:    DILLYN, JOAQUIN                       MRN: [id number] Proc. Date: 07/31/00 Adm. Date:  [id number] Attending:  Ronaldo Miyamoto CC:         Madolyn Frieze. Jens Som, M.D. [facility]  Cardiac Catheterization Lab  Stacie Glaze, M.D. [facility]   Cardiac Catheterization  INDICATIONS:  Ms. Tera Mater is a 65 year old female who presents with some exertional angina.  She has had prior stenting of the left anterior descending artery with a long stent.  The current study was done to assess coronary anatomy.  PROCEDURE: 1. Left heart catheterization. 2. Selective coronary arteriography. 3. Selective left ventriculography. 4. Subclavian angiography.  DESCRIPTION OF PROCEDURE:  The procedure was performed from the right femoral artery using 6-French catheters.  She required a Smart needle to gain access to the right femoral artery.  There were no complications.  She was taken to the holding area in satisfactory clinical condition.  HEMODYNAMIC DATA:  Central aortic pressure was 124/66.  Left ventricular pressure was 123/15.  No gradient on pullback across the aortic valve.  ANGIOGRAPHIC DATA: 1. Left ventriculography was performed in the RAO projection.  Global systolic    function was normal.  No segmental abnormalities or contraction were    identified.  There was no significant mitral regurgitation.  The aortic    leaflets opened well.  Ejection fraction was calculated at 66%. 2. The left main coronary artery was free of critical disease. 3. The left anterior descending artery demonstrates about 70-75% narrowing    just beyond the septal perforator.  This is in the area of the previously    placed stent.  The distal half of the stent has less restenosis, being    about 30-40%.  This is segmental leading into the diagonal.  Beyond the    second diagonal, there is about a 50-60% area of focal narrowing leading    into the distal vessel.  The  septal perforator itself has 70% narrowing    prior to a bifurcation. 4. The circumflex has multiple marginal branches.  There is minor luminal    irregularity but no high grade focal areas of disease. 5. The right coronary artery is a dominant vessel with twin PDAs and a    posterolateral system.  There is a 30% proximal area of narrowing and 20%    mid, and 30% mid distal stenosis.  There are multiple areas of mild luminal    irregularity but no high grade lesions. 6. The subclavian is widely patent.  CONCLUSIONS: 1. Normal left ventricular function. 2. Patent subclavian. 3. Partial restenosis in the previously placed LAD stent. 4. Scattered irregularities of the right coronary as described above.  DISPOSITION:  We will discuss the various options.  They include internal mammary bypass, continued medical therapy, and/or stenting with radiation therapy.  I will review the options with the patient and her family in great detail.  She will be sent home tonight and set up for a radiation procedure if she agrees to this approach. DD:  07/31/00 TD:  07/31/00 Job: [id number]

## 2010-06-21 NOTE — Assessment & Plan Note (Signed)
[facility]                            CARDIOLOGY OFFICE NOTE   NAME:Maravilla, ADRIEANNA BOTELER                       MRN:          [id number]  DATE:02/06/2006                            DOB:          05-24-1945    Tricia [name] returns for followup today.  She has a history of coronary  disease and has had prior PCI of her LAD.  Her most recent nuclear study  was performed in January 2007.  At that time her ejection fraction was  normal and there was no ischemia or infarction.  Since I last saw her  she has not had exertional chest pain, dyspnea, or pedal edema.   Her medications include:  1. Plavix 75 mg p.o. daily.  2. Wellbutrin 320 mg p.o. daily.  3. Imdur 60 mg p.o. daily.  4. Vytorin 10/80 nightly.  5. Multivitamin.  6. Potassium 20 mEq p.o. daily.  7. Benicar/hydrochlorothiazide 40/12.5 mg p.o. daily.  8. Calcium 500 mg p.o. q.i.d.  9. Toprol 50 mg p.o. b.i.d.   PHYSICAL EXAMINATION:  Today shows a blood pressure of 140/62 and her  pulse is 56.  She weighs 199 pounds.  NECK:  Is supple with no bruits.  CHEST:  Is clear.  CARDIOVASCULAR:  Regular rate and rhythm.  EXTREMITIES:  Show no edema.   Her electrocardiogram shows a sinus rhythm at a rate of 56.  There are  no ST changes noted.  She did have recent lipids drawn in November that showed a total  cholesterol of 174 with an LDL of 103.  Her liver functions were normal.   DIAGNOSES:  1. Coronary artery disease.  2. Hyperlipidemia.  3. Hypertension.  4. Diabetes mellitus.  5. Sleep apnea.   PLAN:  Mrs. Vidrine is doing well from a symptomatic standpoint.  We will  continue with the present medications.  We discussed her elevated LDL  today.  Note she has not tolerated Lipitor in the past secondary to  myalgias and Pravachol was ineffective.  We discussed changing to  Crestor and Zetia, but she preferred to try diet at this point.  She  will have her lipids repeated in approximately 5 to 6  months and if LDL  remains greater than 70 than we can consider changing to Crestor at that  time.  A recent Myoview was unremarkable.  She will continue with risk  factor  modification including diet and exercise.  Note she does not smoke.  I  will see her back in 12 months.     Madolyn Frieze Jens Som, MD, [facility]  Electronically Signed    BSC/MedQ  DD: 02/06/2006  DT: 02/06/2006  Job #: [id number]

## 2010-06-21 NOTE — Op Note (Signed)
NAME:  Tricia [name], Tricia [name]                          ACCOUNT NO.:  [id number]   MEDICAL RECORD NO.:  [id number]                   PATIENT TYPE:  INP   LOCATION:  3012                                 FACILITY:  [facility]   PHYSICIAN:  Hilda Lias, M.D.                DATE OF BIRTH:  [birth year]   DATE OF PROCEDURE:  DATE OF DISCHARGE:                                 OPERATIVE REPORT   PREOPERATIVE DIAGNOSIS:  C5-C6 herniated disk with a C6 radiculopathy.   POSTOPERATIVE DIAGNOSIS:  C5-C6 herniated disk with a C6 radiculopathy.   PROCEDURES:  1. Anterior C5-C6 decompression of the spinal cord.  2. Bilateral foraminotomy.  3. Interbody fusion with Allograft plate from  U2-V2 with four screws.  4. Microscope.   SURGEON:  Hilda Lias, M.D.   CLINICAL HISTORY:  The patient has been seen by me many times because of  lumbar spondylolisthesis.  Now, she has been complaining of neck pain with  radiation to both upper extremities.  She has failed with conservative  treatment.  X-rays show that she has a herniated disk with spondylosis of  the level of C5-C6.  Decompression of the spinal cord was advised.   PROCEDURE:  The patient was taken to the OR.  After intubation, the neck was  prepped with Betadine.  Transverse incision through the skin and platysma  was carried out.  We went straight down to the cervical area.  X-rays show  that indeed we were at the level of C4, C5, C6.  We brought the microscope  into the area and the anterior ligament was opened.  A large amount of  degenerative disk disease as well as a fragment going bilaterally was  accomplished.  After we opened the posterior ligament, and we decompressed  the spinal cord, we found the spinal cord had a groove in the midline  secondary to compression.  After decompression, the endplates were drilled.  We had plenty of room for a ___________.  Then, a 7-mm Allograft was  inserted followed by a plate with the four screws.  Lateral  C-spine showed  good position of bone graft.  The area was irrigated.  Hemostasis was done  and the wound was closed with Vicryl and Steri-Strips.                                               Hilda Lias, M.D.    EB/MEDQ  D:  06/27/2003  T:  06/27/2003  Job:  [id number]

## 2010-06-21 NOTE — Discharge Summary (Signed)
NAMEMISHTI, SWANTON                          ACCOUNT NO.:  [id number]   MEDICAL RECORD NO.:  [id number]                   PATIENT TYPE:  INP   LOCATION:  2011                                 FACILITY:  [facility]   PHYSICIAN:  Rollene Rotunda, M.D.                DATE OF BIRTH:  1945/07/24   DATE OF ADMISSION:  01/27/2003  DATE OF DISCHARGE:                           DISCHARGE SUMMARY - REFERRING   DISCHARGE DIAGNOSES:  1. Chest pain.  2. Nonobstructive coronary artery disease.  3. Pneumonia on chest CT.  4. Right lung nodule with recommended follow-up CT scan.  5. Hypercholesterolemia.  6. Hypertension.  7. Diabetes mellitus, apparently diet-controlled.   HOSPITAL COURSE:  This 65 year old female patient, who had known history of  coronary artery disease.  He underwent a PTCA/stent placement to the LAD in  July 2002 as well as September 2001.  Repeat catheterization in June 2003  revealed a patent stent.  She had recurrent substernal chest pain and came  to [facility] on January 27, 2003.  She was sitting and  talking, developed the substernal chest pain.  She took a nitroglycerin  spray without relief.  This pain did radiate into her back.  She continued  another dose of nitroglycerin and did have some relief.  This was different  from her previous chest pain.   She was admitted to [facility], and lab studies revealed a negative D-  dimer.  Sodium 143, potassium 3.8, BUN 11, creatinine 0.8, total cholesterol  148, HDL 44, LDL 85, triglycerides 95.  Cardiac enzymes negative, has very  significant symptoms and known coronary artery disease, and she was set up  for a cardiac catheterization on January 30, 2003.  She was found to have  the following:  Normal left main, LAD with a proximal/mid stent with an in-  stent 25% restenosis.  The distal stent had 25% stenosis.  Circumflex with  40% after the large OM 3.  OM 2 had a large ostial 25% lesion.  Proximal  right  coronary artery 25%, mid 25%.  EF was preserved.   The patient also had a CT of the chest that revealed a left lung pneumonia.  Also a 5 mm density in the right lower lobe was noted.  We need to follow up  a CT scan of her chest in 6-8 weeks to ensure interval resolution as well as  stability of the right lung nodule.  There was no PE noted.   Her EKG on presentation revealed normal sinus rhythm with nonspecific ST-T  wave changes diffusely.  No Q waves present.   Upon discharge, she is to go home on these medications:  1. Baby aspirin 81 mg daily.  2. She is to continue all other medications including Plavix 75 mg daily.  3. Imdur 60 mg daily.  4. Benicar 40 mg daily.  5. Lipitor 80 mg q.h.s.  6. Prevacid 30 mg daily.  7. Resume all of her vitamins.  8. Potassium 20 mEq daily.  9. Wellbutrin XL 300 mg daily.  10.      Sublingual nitroglycerin p.r.n. chest pain.  11.      She may resume her hydrocodone, Xanax, and Skelaxin as well as     Toprol as needed.   No strenuous activity, driving, or lifting over 10 pounds for two days and  gradually increase activity.  Remain on a low fat diabetic diet.  Clean over  cath site with soap and water.  No scrubbing.  Call for questions or  concerns, and she has a follow-up appointment with Dr. Jens Som within the  next two weeks she needs to keep.      Guy Franco, P.A. [facility]                      Rollene Rotunda, M.D.    LB/MEDQ  D:  01/30/2003  T:  01/30/2003  Job:  [id number]   cc:   Olga Millers, M.D.   Stacie Glaze, M.D. [facility]

## 2010-06-21 NOTE — Op Note (Signed)
Tricia [name], Tricia [name]                ACCOUNT NO.:  [id number]   MEDICAL RECORD NO.:  [id number]          PATIENT TYPE:  INP   LOCATION:  5037                         FACILITY:  [facility]   PHYSICIAN:  Elana Alm. Thurston Hole, M.D. DATE OF BIRTH:  09/21/1945   DATE OF PROCEDURE:  09/15/2005  DATE OF DISCHARGE:                                 OPERATIVE REPORT   PREOPERATIVE DIAGNOSIS:  Left knee degenerative joint disease.   POSTOPERATIVE DIAGNOSIS:  Left knee degenerative joint disease.   PROCEDURE:  1. Left total knee replacement using DePuy cemented total knee system with      #4 cemented femur, #4 cemented tibia, with 12.5 mm polyethylene RP      tibial spacer and 35 mm polyethylene cemented patella.  2. Left knee lateral release.   SURGEON:  Elana Alm. Thurston Hole, M.D.   ASSISTANT:  Julien Girt, P.A.   ANESTHESIA:  General.   OPERATIVE TIME:  1 hour 20 minutes.   COMPLICATIONS:  None.   DESCRIPTION OF PROCEDURE:  Tricia [name] was brought to operating room on  09/15/2005 after a femoral nerve block was placed.  She was placed on the  operative table in supine position.  The nerve block was placed by  anesthesia.  She received Ancef 2 grams IV preoperatively for prophylaxis.  After being placed under general anesthesia she had a Foley catheter placed  under sterile conditions.  Her left leg was examined.  Range of motion from  -5 to 125 degrees with mild varus deformity, knee stable to ligamentous exam  with normal patellar tracking.  Left leg was prepped using sterile DuraPrep  and draped using sterile technique.  Leg was exsanguinated and the  tourniquet elevated 375 mm.  Initially through a 15 cm longitudinal incision  based over the patella, initial exposure was made.  The underlying  subcutaneous tissues were incised in line with the skin incision.  A median  arthrotomy was performed revealing an excess amount of normal-appearing  joint fluid.  The articular surfaces were  inspected.  She had grade 4  changes medially, grade 3 to 4 changes laterally and grade 3 to 4 changes in  the patellofemoral joint.  The patellofemoral joint itself was excessively  tight and very scarred down and the lateral release was carried out to  decompress the patellofemoral joint and improve patellar tracking.  Medial  and lateral meniscal remnants were removed as well as the anterior cruciate  ligament.  Osteophytes were removed off the femoral condyles and tibial  plateau.  Intramedullary drill was drilled up the femoral canal for  placement distal femoral cutting jig which was placed in the appropriate  amount of rotation and the distal 11 mm cut was made.  The distal femur was  incised.  A #4 was found to be the appropriate size.  #4 cutting jig was  placed and these cuts were made.  The proximal tibia was exposed.  The  tibial spines were removed with an oscillating saw.  Intramedullary drill  was drilled down the tibial canal for placement of proximal tibial cutting  jig  which was placed in the appropriate manner rotation and a proximal 6 mm  cut was made based off the medial or lower side.  Spacer blocks were then  placed in flexion and extension.  12.5 mm blocks were used to give excellent  balancing and excellent correction of her flexion and varus deformities and  excellent stability.  The #4 tibial base plate trial was then placed on the  cut tibial surface in the keel cut was made.  A #4 PCL box cutter was placed  on the distal femur and these cuts were made.  At this point the #4 femoral  trial was placed.  A #4 tibial base plate trial was placed and with a 12.5  mm polyethylene RP tibial spacer, the knee was reduced, taken through range  of motion from 0 to 120 degrees with excellent stability and excellent  correction of her flexion and varus deformities.  A patellar resurfacing 8  mm cut was made and three locking holes were placed for a 35 mm patella.  The patella  trial was placed and again patellofemoral tracking was evaluated  and this was found to be normal.  After the lateral release had been carried  out.  At this point it was felt that all trial components were excellent  size, fit and stability.  They were then removed.  The knee was then jet  lavage irrigated with 3 liters of saline.  The proximal tibia was then  exposed.  #4 tibial baseplate with cement backing was hammered into position  with an excellent fit with excess cement being removed from around the  edges.  #4 femoral component with cement backing was hammered in position  also with an excellent fit with excess cement being removed from around the  edges.  The 12.5 mm polyethylene RP tibial spacer was placed on the tibial  baseplate.  The knee reduced, taken through range of motion from 0 to 125  degrees with excellent stability and excellent correction of her flexion and  varus deformities.  The 35 mm polyethylene cement backed patella was placed  in its position and held there with a clamp.  After the cement hardened,  patellofemoral tracking was again evaluated.  This was found be normal.  At  this point felt that all the components were of excellent size fit and  stability.  The tourniquet was released.  Hemostasis was obtained with  cautery.  The knee was further irrigated with saline and then the arthrotomy  was closed with #1 Ethibond suture over two medium Hemovac drains.  Subcutaneous tissues closed with 0 and 2-0 Vicryl, subcuticular layer closed  with 3-0 Monocryl.  Steri-Strips were applied.  Sterile dressings were  applied.  A Hemovac injected with 0.25% Marcaine with epinephrine and 4 mg  morphine and clamped.  The patient awakened, extubated, taken to recovery in  stable condition.  Needle, sponge counts correct x2 at end of the case.      Robert A. Thurston Hole, M.D.  Electronically Signed     RAW/MEDQ  D:  09/15/2005  T:  09/15/2005  Job:  [id number]

## 2010-06-21 NOTE — Consult Note (Signed)
[facility]  Patient:    Tricia [name], Tricia [name] Visit Number: [id number] MRN: [id number]          Service Type: FTC Location: FOOT Attending Physician:  Sharren Bridge Dictated by:   Jonelle Sports Cheryll Cockayne, M.D. Proc. Date: 03/18/01 Admit Date:  03/18/2001   CC:         Stacie Glaze, M.D. [facility]   Consultation Report  HISTORY:  This 65 year old white female comes self referred for evaluation and advice regarding her feet in the presence of type 2 diabetes.  The patient apparently has been diagnosed with diabetes only for about six months, but does have some mild neuropathic symptoms in her feet, namely sensation of coldness and also some minor pains that are likely on an early neuropathic basis.  Her diabetes is moderately well controlled with a recent hemoglobin A1C of 7.7.  She has had some callus formation and also has had some trouble with her ankles which had led to her being prescribed a firm orthotic which she places in her shoes beneath a softer OTC insert with satisfaction.  She is here today primarily to see if there are other things she can do to protect her feet from future difficulties.  PAST MEDICAL HISTORY:  Notable for coronary artery disease, hypertension, hyperlipidemia, and possible sleep apnea.  She has had partial nephrectomy on one side for some chronic scarring of the kidney, hysterectomy, cholecystectomy.  ALLERGIES:  No known drug allergies.  MEDICATIONS:  Actos, Lopressor, potassium, Pravachol, aspirin, multiple vitamin, Tricor, Prevacid, Xenical, Plavix, Hyzaar, isosorbide mononitrate, Xanax, Arthrotec, p.r.n. nitroglycerin.  PHYSICAL EXAMINATION  EXTREMITIES:  Limited to the distal lower extremities.  The extremities are free of edema.  All pulses are palpable and show good wave forms by Doppler. She has essentially normal skin temperatures, slightly diminished on the plantar aspect of the left foot, but thought to be  essentially normal.  She has significant callus formations in the heels bilaterally as well as on the medial aspects of each hallux at the interphalangeal joint.  Monofilament testing shows that she has protective sensation throughout both feet.  Her nails show some degree of onychomycosis.  DISPOSITION: 1. The patient is given instruction regarding foot care and diabetes by video    with nurse and physician reinforcement. 2. Her shoes are assessed and found to be adequate. 3. The calluses on the halluces bilaterally and on the heels bilaterally are    Dremeled without incident. 4. Two of the nails are reduced by Dremel and the nails are otherwise trimmed    by the nurse. 5. Follow-up visit will be to this clinic on a p.r.n. nail care basis,    otherwise to the physician as needed. Dictated by:   Jonelle Sports Cheryll Cockayne, M.D. Attending Physician:  Sharren Bridge DD:  03/18/01 TD:  03/18/01 Job: [id number]

## 2010-06-21 NOTE — Assessment & Plan Note (Signed)
[facility]                                 ON-CALL NOTE   NAME:COHENPiera, Tricia [name]                         MRN:          [id number]  DATE:06/20/2006                            DOB:          [birth year]    PRIMARY CARE PHYSICIAN:  Dr. Lovell Sheehan.   [name] calls in today complaining of a one day history of mild  congestion, sore throat, and cough.  She denies any fevers, chills, or  shortness of breath.   PLAN:  Advised patient that her symptoms are consistent of viral  illness, especially since she has positive sick contacts at home.  She  is to use over the counter medicine like Mucinex as needed.  If her  symptoms worsen in any way or do not improve over the next several days  she is to follow up with her primary care Tate Zagal.     Leanne Chang, M.D.  Electronically Signed    LA/MedQ  DD: 06/20/2006  DT: 06/20/2006  Job #: [id number]

## 2010-06-21 NOTE — Op Note (Signed)
NAME:  Tricia [name], BRAZIEL                          ACCOUNT NO.:  [id number]   MEDICAL RECORD NO.:  [id number]                   PATIENT TYPE:  AMB   LOCATION:  DSC                                  FACILITY:  [facility]   PHYSICIAN:  Robert A. Thurston Hole, M.D.              DATE OF BIRTH:  1945/10/13   DATE OF PROCEDURE:  05/08/2003  DATE OF DISCHARGE:                                 OPERATIVE REPORT   PREOPERATIVE DIAGNOSIS:  Left knee medial and lateral meniscal tears with  chondromalacia and synovitis.   POSTOPERATIVE DIAGNOSIS:  Left knee medial and lateral meniscal tears with  chondromalacia and synovitis.   OPERATION PERFORMED:  1. Left knee examination under anesthesia followed by arthroscopic partial     medial and lateral meniscectomies.  2. Left knee chondroplasty with partial synovectomy.   SURGEON:  Elana Alm. Thurston Hole, M.D.   ASSISTANT:  Julien Girt, P.A.   ANESTHESIA:  Local with MAC.   OPERATIVE TIME:  30 minutes.   COMPLICATIONS:  None.   INDICATIONS FOR PROCEDURE:  [name] is a 65 year old woman who has had  significant left knee pain for the past two years increasing in nature with  exam and MRI documenting medial and lateral meniscal tears with  chondromalacia and synovitis.  She has failed conservative care and is now  to undergo arthroscopy.   DESCRIPTION OF PROCEDURE:  [name] was brought to the operating room on  May 08, 2003 after a knee block had been placed in the holding room by  anesthesia.  She was placed on the operating table in supine position.  Her  left knee was examination under anesthesia.  Range of motion 0 to 125  degrees.  1 to 2+ crepitation.  Knee stable to ligamentous exam with normal  patellar tracking.  The left leg was prepped using sterile DuraPrep and  draped using sterile technique.  Originally through an anterolateral portal,  the arthroscope with a pump attached was placed and through an anteromedial  portal an arthroscopic  probe was placed.  On initial inspection of the  medial compartment, she was found to have 50 to 60% grade 3 chondromalacia  which was debrided.  Medial meniscus tearing posterior and medial horn of  which 50% was resected back to a stable rim.  The intercondylar notch was  inspected.  Anterior and posterior cruciate ligaments were normal.  Lateral  compartment inspected.  Mild grade 1 and 2 chondromalacia noted.  Lateral  meniscus tear 25 to 30% posterior and lateral horn which was resected back  to a stable rim.  Patellofemoral joint showed 40 to 50% grade 3  chondromalacia on the patellofemoral groove and this was debrided.  The  patella tracked normally.  Moderate synovitis in the medial and lateral  gutters was debrided.  Otherwise this was free of pathology.  After this was  done, it was felt that all pathology had  been satisfactorily address.  The  instruments were removed.  Portals were closed with 3-0 nylon suture and  injected with 0.25% Marcaine with epinephrine and 4 mg of morphine.  Sterile  dressing was applied.  The patient was awakened and taken to recovery room  in stable condition.   FOLLOW UP:  Ms.  Adderly will be followed as an outpatient on Vicodin and  Naprosyn.  See her back in the office in a week for suture removal and  follow-up.                                               Robert A. Thurston Hole, M.D.    RAW/MEDQ  D:  05/08/2003  T:  05/08/2003  Job:  [id number]

## 2010-06-21 NOTE — Discharge Summary (Signed)
[place]  Patient:    Tricia [name], Tricia [name]                       MRN: [id number] Adm. Date:  [id number] Disc. Date: [id number] Attending:  Ronaldo Miyamoto Dictator:   Delton See, P.A. CC:         Stacie Glaze, M.D. [facility]   Referring Physician Discharge [place]  DATE OF BIRTH:  02/25/2045  BRIEF HISTORY:  This is a 65 year old female who has a history of PTCA and stenting of the LAD.  I do not have the date for that procedure.  She recently underwent cardiac catheterization on July 31, 2000, performed by Arturo Morton. Riley Kill, M.D.  At that time, she had an ejection fraction of 66%.  She had a 70-75% narrowing just beyond the septal perforator in the area of the previously placed stent in the LAD.  There was also a 50-60% lesion of the LAD beyond the stent.  The septal perforator had a 70% narrowing.  The circumflex had some mild irregularities.  The RCA had twin PDAs in the PL system with a 30% proximal area and 20% mid and 30% distal RCA lesions.  At that time, the decision was made to have the patient return for a percutaneous intervention on the LAD restenosis.  She was admitted on August 18, 2000, for that procedure.  PAST MEDICAL HISTORY:  The patient has a history of hypertension, hyperlipidemia, obesity, and remote peptic ulcer disease.  ALLERGIES:  No known drug allergies.  HOSPITAL COURSE:  As noted, this patient was admitted for percutaneous intervention of the LAD for a restenosis of her previously placed stent.  This was performed on August 18, 2000, by Arturo Morton. Riley Kill, M.D.  Her stent had a 75% restenosis.  This was reduced to 0% using a cutting balloon.  The patient tolerated the procedure well.  Arrangements were made to discharge her the following day in improved condition.  She had been started on Plavix prior to her admission.  This was continued at the time of discharge.  LABORATORY DATA:  A CBC on the day of discharge revealed  hemoglobin 11.2, hematocrit 32, platelets 218,000, and WBC 4.7.  Cardiac enzymes were negative. The BUN was 20, creatinine 1.2, potassium 3.7, and glucose 102.  DISCHARGE MEDICATIONS:  1. Enteric-coated aspirin 325 mg daily.  2. Plavix 75 mg daily.  The duration of treatment was unknown at this time.  3. Potassium chloride 20 mEq daily.  4. Lotensin 40 mg daily.  5. Lopressor 50 mg b.i.d.  6. Pravachol 80 mg each evening.  7. Tricor 160 mg daily.  8. Prevacid 30 mg daily.  9. Xenical 120 mg daily. 10. Hydrochlorothiazide 25 mg daily. 11. Imdur 30 mg daily. 12. Multivitamins as previously taken. 13. Nitroglycerin p.r.n. for chest pain.  ACTIVITY:  The patient was told to avoid any strenuous activity or driving for two days.  She was told not to lift more than 10 pounds for one week.  DIET:  She was to be on a low-salt, low-fat diet.  WOUND CARE:  She was told to call the office if she had any increased pain, swelling, or bleeding from her groin.  FOLLOW-UP:  She was to follow up with Dr. Madolyn Frieze. [place] physician assistant on September 04, 2000, at 9 a.m. and with Stacie Glaze, M.D., as needed or as scheduled.  PROBLEM LIST AT THE TIME OF DISCHARGE: 1.  Status post percutaneous intervention for restenosis of left anterior    descending stent performed on August 18, 2000.  Ejection fraction of 66% in    the past. 2. History of hypertension. 3. Elevated lipids. 4. Obesity. 5. Remote peptic ulcer disease. 6. Mild anemia. 7. Long-term Plavix treatment planned. DD:  08/19/00 TD:  08/19/00 Job: [id number]

## 2010-06-21 NOTE — Discharge Summary (Signed)
[place]  Patient:    Tricia [name]                       MRN: [id number] Adm. Date:  [id number] Disc. Date: 10/26/99 Attending:  Lewayne Bunting Dictator:   Lavella Hammock, P.A.-C. CC:         Madolyn Frieze. Jens Som, M.D. [facility]             Stacie Glaze, M.D. [facility]                           Discharge Summary  DATE OF BIRTH:  May 08, 1945  PROCEDURE: 1. Cardiac catheterization. 2. Coronary arteriogram. 3. Left ventriculogram.  HISTORY OF PRESENT ILLNESS:  Tricia [name] is a 65 year old female who was seen in the office and evaluated for possible unstable anginal pain.  She had a Cardiolite stress test done with Persantine, and the images demonstrated a severe anteroseptal, anteroapical, and inferoapical defect.  She had recurrent anginal symptoms after the stress test, and came back for an evaluation.  She was sent to the emergency room for admission, and scheduled for a cardiac catheterization.  HOSPITAL COURSE:  She had a cardiac catheterization on October 25, 1999, which showed  a normal left main and LAD with a 95% and then a 70% midlesion. The first septal had a 50% lesion.  The distal LAD had a 60%-70% lesion.  The RCA had some proximal irregularity, with a 40% distal lesion.  The circumflex had no significant coronary artery disease.  She had apical dyskinesis on her left ventriculogram.  She had a percutaneous transluminal coronary angioplasty and stent to the LAD, reducing the stenosis from 95% to 0%, with TIMI-3 flow. She tolerated the procedure well.  The next day there was no bruise or hematoma, or ecchymosis at the groin site, and distal pulses were intact.  DISPOSITION:  She is to be seen by cardiac rehabilitation, and is considered stable for discharge if her groin is stable after her Integrilin is completed at 6 p.m.  LABORATORY DATA:  Hemoglobin 13.4, hematocrit 36.4, wbcs 8.5, platelets 259. Sodium 134, potassium 3.9, chloride  101, CO2 of 24, BUN 17, creatinine 1.0, glucose 298.  Chest x-ray:  The heart size and mediastinal contours are within normal limits.  The lungs are clear.  CONDITION ON DISCHARGE:  Stable.  CONSULTATION:  None.  COMPLICATIONS:  None.  DISCHARGE DIAGNOSES: 1. Coronary artery disease, status post percutaneous transluminal coronary    angioplasty and stent in the left anterior descending coronary artery    this admission. 2. Hypertension. 3. Hypokalemia on admission, supplemented. 4. Non-insulin-dependent diabetes mellitus. 5. Unknown lipid status.  DISCHARGE INSTRUCTIONS: 1. Her activity level is to include no driving, sexual or strenuous    activity for two days. 2. She is to stick to a low-fat, diabetic diet. 3. She is to call the office for bleeding, swelling, or drainage at    the catheterization site. 4. She is to follow up with Dr. Stacie Glaze as needed. 5. She is to follow up with Dr. Madolyn Frieze. [place]shaw in the office, and to    call for an appointment.  DISCHARGE MEDICATIONS: 1. Coated aspirin 325 mg q.d. 2. Plavix 75 mg q.d. for one month. 3. Nitroglycerin 0.4 mg sublingual p.r.n. 4. Lotensin 20 mg q.d. 5. Lopressor 50 mg 1/2 tablet b.i.d. DD:  10/26/99 TD:  10/26/99 Job: [id number]  [id number]

## 2010-06-21 NOTE — Cardiovascular Report (Signed)
[name], [name]                          ACCOUNT NO.:  [id number]   MEDICAL RECORD NO.:  [id number]                   PATIENT TYPE:  INP   LOCATION:  2011                                 FACILITY:  [facility]   PHYSICIAN:  Rollene Rotunda, M.D.                DATE OF BIRTH:  1945-09-23   DATE OF PROCEDURE:  01/30/2003  DATE OF DISCHARGE:                              CARDIAC CATHETERIZATION   PRIMARY CARE PHYSICIAN:  Stacie Glaze, M.D. [facility]   PROCEDURE:  1. Left heart catheterization.  2. Coronary arteriography.   INDICATIONS:  Evaluate patient with chest pain and previous stenting x2 of  her LAD.   PROCEDURAL NOTE:  Left heart catheterization performed via the right femoral  artery.  The artery was cannulated using anterior wall puncture.  A #6-  French arterial sheath was inserted via the modified Seldinger technique.  Preformed Judkins and a pigtail catheter were utilized.  The patient  tolerated the procedure well and left the laboratory in stable condition.   RESULTS:  HEMODYNAMICS:  LV 173/13.  AO 170/81.   CORONARIES:  The left main was normal.   The LAD had proximal and mid stent with diffuse in-stent 25% stenosis.  There was a distal stent which again had diffuse 25% irregularities within  the stented area.  First diagonal was small.  Second and third diagonals  were moderate size with diffuse luminal irregularities.   The circumflex in the AV groove had diffuse luminal irregularities.  The  distal AV groove had 40% stenosis after a third obtuse marginal before a  small posterolateral.  First obtuse marginal was small and normal.  The  second obtuse marginal was large with ostial 25% stenosis.  A third obtuse  marginal was large and normal.   The right coronary artery was dominant.  There was proximal 25 and 30%  stenosis.  There was mid 25% and 30% stenosis.  There were diffuse luminal  irregularities throughout the vessel.   LEFT VENTRICULOGRAM:  The left  ventriculogram was obtained in the RAO  projection.  The EF was 65% with normal wall motion.   CONCLUSION:  1. Nonobstructive residual coronary disease.  2. Well preserved ejection fraction.   PLAN:  The patient will continue to have aggressive secondary risk  reduction.  She will follow up with Dr. Lovell Sheehan for evaluation of nonanginal  chest discomfort if her symptoms recur.                                               Rollene Rotunda, M.D.    JH/MEDQ  D:  01/30/2003  T:  01/30/2003  Job:  [id number]   cc:   Stacie Glaze, M.D. [facility]

## 2010-07-11 ENCOUNTER — Other Ambulatory Visit: Payer: Self-pay | Admitting: Internal Medicine

## 2010-07-11 MED ORDER — ALPRAZOLAM 0.25 MG PO TABS: 0.2500 mg | ORAL_TABLET | Freq: Three times a day (TID) | ORAL | Status: DC | PRN

## 2010-07-11 NOTE — Telephone Encounter (Signed)
Pt req to get a brand new script written for Alprazolam .5 mg to [facility] on [address].

## 2010-08-23 ENCOUNTER — Encounter: Payer: Self-pay | Admitting: Internal Medicine

## 2010-08-23 ENCOUNTER — Ambulatory Visit (INDEPENDENT_AMBULATORY_CARE_PROVIDER_SITE_OTHER): Payer: Medicare Other | Admitting: Internal Medicine

## 2010-08-23 VITALS — BP 140/90 | HR 72 | Temp 98.2°F | Resp 16 | Ht 64.0 in | Wt 190.0 lb

## 2010-08-23 DIAGNOSIS — I2510 Atherosclerotic heart disease of native coronary artery without angina pectoris: Secondary | ICD-10-CM

## 2010-08-23 DIAGNOSIS — K219 Gastro-esophageal reflux disease without esophagitis: Secondary | ICD-10-CM

## 2010-08-23 DIAGNOSIS — K279 Peptic ulcer, site unspecified, unspecified as acute or chronic, without hemorrhage or perforation: Secondary | ICD-10-CM

## 2010-08-23 LAB — CBC WITH DIFFERENTIAL/PLATELET
Basophils Absolute: 0 10*3/uL (ref 0.0–0.1)
Basophils Relative: 0.4 % (ref 0.0–3.0)
Eosinophils Absolute: 0.1 10*3/uL (ref 0.0–0.7)
Eosinophils Relative: 1.1 % (ref 0.0–5.0)
HCT: 36.6 % (ref 36.0–46.0)
Hemoglobin: 12.2 g/dL (ref 12.0–15.0)
Lymphocytes Relative: 27.8 % (ref 12.0–46.0)
Lymphs Abs: 1.8 10*3/uL (ref 0.7–4.0)
MCHC: 33.4 g/dL (ref 30.0–36.0)
MCV: 80.1 fl (ref 78.0–100.0)
Monocytes Absolute: 0.4 10*3/uL (ref 0.1–1.0)
Monocytes Relative: 5.7 % (ref 3.0–12.0)
Neutro Abs: 4.2 10*3/uL (ref 1.4–7.7)
Neutrophils Relative %: 65 % (ref 43.0–77.0)
Platelets: 238 10*3/uL (ref 150.0–400.0)
RBC: 4.57 Mil/uL (ref 3.87–5.11)
RDW: 14.2 % (ref 11.5–14.6)
WBC: 6.5 10*3/uL (ref 4.5–10.5)

## 2010-08-23 MED ORDER — NITROGLYCERIN 0.4 MG/SPRAY TL SOLN: 1.0000 | Status: DC | PRN

## 2010-08-23 NOTE — Progress Notes (Signed)
Subjective:    Patient ID: Tricia [name], female    DOB: Oct 13, 1945, 65 y.o.   MRN: [id number]  HPI Pain in the mid upper left chest primarily with laying flat not exertional not present when she is sitting or standing it is reproducible. The pain is onset is approximately a half hour after reclining . She has taken nitroglycerin which did not change the nature of the pain she states it is primarily relieved by getting up" "walking around"or sitting up and sleeping The patient has been having some increased acid reflux symptoms and has a history of GERD. No sweats or nausea with these symptoms at night.   Review of Systems  Constitutional: Negative for activity change, appetite change and fatigue.  HENT: Positive for congestion. Negative for ear pain, neck pain, postnasal drip and sinus pressure.   Eyes: Negative for redness and visual disturbance.  Respiratory: Negative for cough, shortness of breath and wheezing.   Gastrointestinal: Positive for nausea and abdominal pain. Negative for abdominal distention.  Genitourinary: Negative for dysuria, frequency and menstrual problem.  Musculoskeletal: Positive for myalgias. Negative for joint swelling and arthralgias.  Skin: Negative for rash and wound.  Neurological: Negative for dizziness, weakness and headaches.  Hematological: Negative for adenopathy. Does not bruise/bleed easily.  Psychiatric/Behavioral: Negative for sleep disturbance and decreased concentration.   Past Medical History  Diagnosis Date  . CAD (coronary artery disease)   . Hypertension   . Hyperlipidemia   . Obesity   . Anxiety   . Sleep apnea   . Diabetes mellitus   . Dermatophytosis of groin and perianal area   . Anemia   . B12 deficiency   . Osteoporosis   . Depression   . Arthritis   . Low back pain     l5 disc  . Diverticulosis    Past Surgical History  Procedure Date  . Abdominal hysterectomy   . Bariatric surgery   . Coronary angioplasty 2002    2  times  . Appendectomy   . Carpal tunnel release   . Hernia repair   . Tonsillectomy   . Ulnar nerve release   . Total knee arthroplasty   . Nephrectomy   . Lumbar laminectomy     l3-4  . Cholecystectomy   . Tubal ligation     reports that she has been smoking.  She does not have any smokeless tobacco history on file. She reports that she does not drink alcohol or use illicit drugs. family history includes Colitis in her mother; Coronary artery disease in an unspecified family member; and Heart disease in her father. Allergies  Allergen Reactions  . Sulfamethoxazole W/Trimethoprim     REACTION: Reaction not known  . Sulfonamide Derivatives        Objective:   Physical Exam  Constitutional: She is oriented to person, place, and time. She appears well-developed and well-nourished. No distress.  HENT:  Head: Normocephalic and atraumatic.  Right Ear: External ear normal.  Left Ear: External ear normal.  Nose: Nose normal.  Mouth/Throat: Oropharynx is clear and moist.  Eyes: Conjunctivae and EOM are normal. Pupils are equal, round, and reactive to light.  Neck: Normal range of motion. Neck supple. No JVD present. No tracheal deviation present. No thyromegaly present.  Cardiovascular: Normal rate, regular rhythm, normal heart sounds and intact distal pulses.   No murmur heard. Pulmonary/Chest: Effort normal and breath sounds normal. She has no wheezes. She exhibits no tenderness.  Abdominal: Soft. Bowel sounds are  normal.  Musculoskeletal: Normal range of motion. She exhibits no edema and no tenderness.  Lymphadenopathy:    She has no cervical adenopathy.  Neurological: She is alert and oriented to person, place, and time. She has normal reflexes. No cranial nerve deficit.  Skin: Skin is warm and dry. She is not diaphoretic.  Psychiatric: She has a normal mood and affect. Her behavior is normal.          Assessment & Plan:  Patient has typical gastroesophageal reflux I  suspect that this positional chest pain that she has and lying down is due to reflux but she also has a history of peptic ulcer disease a CBC will be drawn today to make sure that her hemoglobin is stable and she is placed on Prilosec 20 mg by mouth twice a day for 6 weeks with followup at the end of 6 weeks if the pain largely resolved but she has some breakthrough consider an EGD if the pain does not respond quickly would consider cardiology referral patient is aware of these processes and the overall plan and agrees with the

## 2010-08-23 NOTE — Patient Instructions (Signed)
Take the Prilosec one pill twice a day on an empty stomach

## 2010-09-11 ENCOUNTER — Other Ambulatory Visit: Payer: Self-pay | Admitting: Internal Medicine

## 2010-10-04 ENCOUNTER — Telehealth: Payer: Self-pay | Admitting: *Deleted

## 2010-10-04 ENCOUNTER — Ambulatory Visit (INDEPENDENT_AMBULATORY_CARE_PROVIDER_SITE_OTHER): Payer: Medicare Other | Admitting: Internal Medicine

## 2010-10-04 VITALS — BP 140/80 | HR 76 | Temp 98.2°F | Resp 16 | Ht 64.0 in | Wt 194.0 lb

## 2010-10-04 DIAGNOSIS — K219 Gastro-esophageal reflux disease without esophagitis: Secondary | ICD-10-CM

## 2010-10-04 DIAGNOSIS — Z9884 Bariatric surgery status: Secondary | ICD-10-CM

## 2010-10-04 DIAGNOSIS — I10 Essential (primary) hypertension: Secondary | ICD-10-CM

## 2010-10-04 MED ORDER — OMEPRAZOLE 20 MG PO CPDR: 20.0000 mg | DELAYED_RELEASE_CAPSULE | Freq: Every day | ORAL | Status: DC

## 2010-10-04 NOTE — Telephone Encounter (Signed)
Opened in error

## 2010-10-06 ENCOUNTER — Encounter: Payer: Self-pay | Admitting: Internal Medicine

## 2010-10-06 NOTE — Progress Notes (Signed)
Subjective:    Patient ID: Tricia [name], female    DOB: 10-10-1945, 65 y.o.   MRN: [id number]  HPI A 65 year old white female who presents for followup of chronic medical problems. He is seen for followup of the institution of a PPI for chronic gastroesophageal reflux.  She states that since she has been on the Prilosec at 20 mg over-the-counter strength a daily basis that her GERD symptoms have largely resolved.   Review of Systems  Constitutional: Negative for activity change, appetite change and fatigue.  HENT: Negative for ear pain, congestion, neck pain, postnasal drip and sinus pressure.   Eyes: Negative for redness and visual disturbance.  Respiratory: Negative for cough, shortness of breath and wheezing.   Gastrointestinal: Negative for abdominal pain and abdominal distention.  Genitourinary: Negative for dysuria, frequency and menstrual problem.  Musculoskeletal: Negative for myalgias, joint swelling and arthralgias.  Skin: Negative for rash and wound.  Neurological: Negative for dizziness, weakness and headaches.  Hematological: Negative for adenopathy. Does not bruise/bleed easily.  Psychiatric/Behavioral: Negative for sleep disturbance and decreased concentration.   Past Medical History  Diagnosis Date  . CAD (coronary artery disease)   . Hypertension   . Hyperlipidemia   . Obesity   . Anxiety   . Sleep apnea   . Diabetes mellitus   . Dermatophytosis of groin and perianal area   . Anemia   . B12 deficiency   . Osteoporosis   . Depression   . Arthritis   . Low back pain     l5 disc  . Diverticulosis    Past Surgical History  Procedure Date  . Abdominal hysterectomy   . Bariatric surgery   . Coronary angioplasty 2002    2 times  . Appendectomy   . Carpal tunnel release   . Hernia repair   . Tonsillectomy   . Ulnar nerve release   . Total knee arthroplasty   . Nephrectomy   . Lumbar laminectomy     l3-4  . Cholecystectomy   . Tubal ligation     reports that she has been smoking.  She does not have any smokeless tobacco history on file. She reports that she does not drink alcohol or use illicit drugs. family history includes Colitis in her mother; Coronary artery disease in an unspecified family member; and Heart disease in her father. Allergies  Allergen Reactions  . Sulfamethoxazole W/Trimethoprim     REACTION: Reaction not known  . Sulfonamide Derivatives        Objective:   Physical Exam  Nursing note and vitals reviewed. Constitutional: She is oriented to person, place, and time. She appears well-developed and well-nourished. No distress.  HENT:  Head: Normocephalic and atraumatic.  Right Ear: External ear normal.  Left Ear: External ear normal.  Nose: Nose normal.  Mouth/Throat: Oropharynx is clear and moist.  Eyes: Conjunctivae and EOM are normal. Pupils are equal, round, and reactive to light.  Neck: Normal range of motion. Neck supple. No JVD present. No tracheal deviation present. No thyromegaly present.  Cardiovascular: Normal rate, regular rhythm, normal heart sounds and intact distal pulses.   No murmur heard. Pulmonary/Chest: Effort normal and breath sounds normal. She has no wheezes. She exhibits no tenderness.  Abdominal: Soft. Bowel sounds are normal.  Musculoskeletal: Normal range of motion. She exhibits no edema and no tenderness.  Lymphadenopathy:    She has no cervical adenopathy.  Neurological: She is alert and oriented to person, place, and time. She has normal  reflexes. No cranial nerve deficit.  Skin: Skin is warm and dry. She is not diaphoretic.  Psychiatric: She has a normal mood and affect. Her behavior is normal.          Assessment & Plan:  Patient GERD has largely resolved with the use of a PPI.  Due to her gastric bypass status is certainly an issue with potential ulcer disease at the anastomosis.  I recommended she continue the proton pump inhibitor long-term due to these risk  factors and the resolution of her symptoms.  She is also followed for B12 deficiency anemia mild depression appropriate monitoring should be obtained including CBC and B12 levels B12 will be administered today  Her idiopathic urticaria or allergic urticaria . Blood pressure is stable on Benicar

## 2010-10-14 ENCOUNTER — Other Ambulatory Visit: Payer: Self-pay

## 2010-10-14 MED ORDER — HYDROCODONE-ACETAMINOPHEN 5-500 MG PO TABS: 1.0000 | ORAL_TABLET | Freq: Four times a day (QID) | ORAL | Status: DC | PRN

## 2011-01-01 ENCOUNTER — Ambulatory Visit: Payer: Medicare Other | Admitting: Internal Medicine

## 2011-01-21 ENCOUNTER — Ambulatory Visit (INDEPENDENT_AMBULATORY_CARE_PROVIDER_SITE_OTHER): Payer: Medicare Other | Admitting: Internal Medicine

## 2011-01-21 DIAGNOSIS — Z23 Encounter for immunization: Secondary | ICD-10-CM

## 2011-01-24 ENCOUNTER — Telehealth: Payer: Self-pay | Admitting: *Deleted

## 2011-01-24 NOTE — Telephone Encounter (Signed)
Pt needs something for cough.

## 2011-01-24 NOTE — Telephone Encounter (Signed)
mucinex dm bid for 7 days 

## 2011-01-24 NOTE — Telephone Encounter (Signed)
Pt aware.

## 2011-02-27 ENCOUNTER — Other Ambulatory Visit: Payer: Self-pay | Admitting: Internal Medicine

## 2011-05-05 ENCOUNTER — Ambulatory Visit (INDEPENDENT_AMBULATORY_CARE_PROVIDER_SITE_OTHER): Payer: Medicare Other | Admitting: Internal Medicine

## 2011-05-05 ENCOUNTER — Encounter: Payer: Self-pay | Admitting: Internal Medicine

## 2011-05-05 ENCOUNTER — Other Ambulatory Visit: Payer: Self-pay | Admitting: *Deleted

## 2011-05-05 VITALS — BP 180/100 | HR 76 | Temp 98.3°F | Resp 16 | Ht 63.0 in | Wt 202.0 lb

## 2011-05-05 DIAGNOSIS — I10 Essential (primary) hypertension: Secondary | ICD-10-CM

## 2011-05-05 DIAGNOSIS — R300 Dysuria: Secondary | ICD-10-CM

## 2011-05-05 DIAGNOSIS — R319 Hematuria, unspecified: Secondary | ICD-10-CM

## 2011-05-05 LAB — POCT URINALYSIS DIPSTICK
Bilirubin, UA: NEGATIVE
Glucose, UA: NEGATIVE
Ketones, UA: NEGATIVE
Nitrite, UA: POSITIVE
Spec Grav, UA: 1.02
Urobilinogen, UA: 0.2
pH, UA: 5.5

## 2011-05-05 MED ORDER — CIPROFLOXACIN HCL 250 MG PO TABS: 250.0000 mg | ORAL_TABLET | Freq: Two times a day (BID) | ORAL | Status: AC

## 2011-05-05 NOTE — Patient Instructions (Signed)
The patient is instructed to continue all medications as prescribed. Schedule followup with check out clerk upon leaving the clinic  

## 2011-05-05 NOTE — Progress Notes (Signed)
  Subjective:    Patient ID: Tricia [name], female    DOB: 1945/08/27, 66 y.o.   MRN: [id number]  HPI  Patient is a 66 year old female who presents for an acute urinary tract symptoms.  She has had burning and dysuria for several days she has also been out of her blood pressure medicine for over one week as evidenced by markedly elevated blood pressure today we discussed her elevated blood pressure and the need to take the medication as scheduled she has been severely depressed because of the diagnosis of her husband with stage IV cancer  Review of Systems  Constitutional: Negative for activity change, appetite change and fatigue.  HENT: Negative for ear pain, congestion, neck pain, postnasal drip and sinus pressure.   Eyes: Negative for redness and visual disturbance.  Respiratory: Negative for cough, shortness of breath and wheezing.   Gastrointestinal: Negative for abdominal pain and abdominal distention.  Genitourinary: Negative for dysuria, frequency and menstrual problem.  Musculoskeletal: Negative for myalgias, joint swelling and arthralgias.  Skin: Negative for rash and wound.  Neurological: Negative for dizziness, weakness and headaches.  Hematological: Negative for adenopathy. Does not bruise/bleed easily.  Psychiatric/Behavioral: Negative for sleep disturbance and decreased concentration.       Objective:   Physical Exam  Nursing note and vitals reviewed. Constitutional: She is oriented to person, place, and time. She appears well-developed and well-nourished. No distress.  HENT:  Head: Normocephalic and atraumatic.  Right Ear: External ear normal.  Left Ear: External ear normal.  Nose: Nose normal.  Mouth/Throat: Oropharynx is clear and moist.  Eyes: Conjunctivae and EOM are normal. Pupils are equal, round, and reactive to light.  Neck: Normal range of motion. Neck supple. No JVD present. No tracheal deviation present. No thyromegaly present.  Cardiovascular: Normal  rate, regular rhythm, normal heart sounds and intact distal pulses.   No murmur heard. Pulmonary/Chest: Effort normal and breath sounds normal. She has no wheezes. She exhibits no tenderness.  Abdominal: Soft. Bowel sounds are normal.  Musculoskeletal: Normal range of motion. She exhibits no edema and no tenderness.  Lymphadenopathy:    She has no cervical adenopathy.  Neurological: She is alert and oriented to person, place, and time. She has normal reflexes. No cranial nerve deficit.  Skin: Skin is warm and dry. She is not diaphoretic.  Psychiatric: She has a normal mood and affect. Her behavior is normal.          Assessment & Plan:  UA was positive and was cultured She'll be treated with Cipro 250 twice a day for 10 days.  We also started her that she needs to resume her blood pressure medications today warned her about the risk given her history of CAD that she is taking by not taking her blood pressure medications

## 2011-05-07 LAB — URINE CULTURE: Colony Count: >=100000

## 2011-05-16 ENCOUNTER — Other Ambulatory Visit (INDEPENDENT_AMBULATORY_CARE_PROVIDER_SITE_OTHER): Payer: Medicare Other

## 2011-05-16 DIAGNOSIS — Z0000 Encounter for general adult medical examination without abnormal findings: Secondary | ICD-10-CM

## 2011-05-16 DIAGNOSIS — E785 Hyperlipidemia, unspecified: Secondary | ICD-10-CM

## 2011-05-16 DIAGNOSIS — Z79899 Other long term (current) drug therapy: Secondary | ICD-10-CM

## 2011-05-16 LAB — MICROALBUMIN / CREATININE URINE RATIO
Creatinine,U: 116.2 mg/dL
Microalb Creat Ratio: 0.5 mg/g (ref 0.0–30.0)
Microalb, Ur: 0.6 mg/dL (ref 0.0–1.9)

## 2011-05-16 LAB — CBC WITH DIFFERENTIAL/PLATELET
Basophils Absolute: 0 10*3/uL (ref 0.0–0.1)
Basophils Relative: 0.7 % (ref 0.0–3.0)
Eosinophils Absolute: 0.1 10*3/uL (ref 0.0–0.7)
Eosinophils Relative: 2.1 % (ref 0.0–5.0)
HCT: 36.7 % (ref 36.0–46.0)
Hemoglobin: 12.1 g/dL (ref 12.0–15.0)
Lymphocytes Relative: 32 % (ref 12.0–46.0)
Lymphs Abs: 1.7 10*3/uL (ref 0.7–4.0)
MCHC: 33.1 g/dL (ref 30.0–36.0)
MCV: 77.9 fl — ABNORMAL LOW (ref 78.0–100.0)
Monocytes Absolute: 0.4 10*3/uL (ref 0.1–1.0)
Monocytes Relative: 7.6 % (ref 3.0–12.0)
Neutro Abs: 3.1 10*3/uL (ref 1.4–7.7)
Neutrophils Relative %: 57.6 % (ref 43.0–77.0)
Platelets: 262 10*3/uL (ref 150.0–400.0)
RBC: 4.71 Mil/uL (ref 3.87–5.11)
RDW: 14.7 % — ABNORMAL HIGH (ref 11.5–14.6)
WBC: 5.4 10*3/uL (ref 4.5–10.5)

## 2011-05-16 LAB — HEPATIC FUNCTION PANEL
ALT: 13 U/L (ref 0–35)
AST: 18 U/L (ref 0–37)
Albumin: 4 g/dL (ref 3.5–5.2)
Alkaline Phosphatase: 112 U/L (ref 39–117)
Bilirubin, Direct: 0 mg/dL (ref 0.0–0.3)
Total Bilirubin: 0.3 mg/dL (ref 0.3–1.2)
Total Protein: 7.1 g/dL (ref 6.0–8.3)

## 2011-05-16 LAB — POCT URINALYSIS DIPSTICK
Bilirubin, UA: NEGATIVE
Blood, UA: NEGATIVE
Glucose, UA: NEGATIVE
Ketones, UA: NEGATIVE
Leukocytes, UA: NEGATIVE
Nitrite, UA: NEGATIVE
Protein, UA: NEGATIVE
Spec Grav, UA: 1.02
Urobilinogen, UA: 0.2
pH, UA: 5.5

## 2011-05-16 LAB — BASIC METABOLIC PANEL
BUN: 27 mg/dL — ABNORMAL HIGH (ref 6–23)
CO2: 27 mEq/L (ref 19–32)
Calcium: 9.7 mg/dL (ref 8.4–10.5)
Chloride: 106 mEq/L (ref 96–112)
Creatinine, Ser: 1.2 mg/dL (ref 0.4–1.2)
GFR: 48.58 mL/min — ABNORMAL LOW (ref >60.00)
Glucose, Bld: 93 mg/dL (ref 70–99)
Potassium: 5.6 mEq/L — ABNORMAL HIGH (ref 3.5–5.1)
Sodium: 143 mEq/L (ref 135–145)

## 2011-05-16 LAB — LIPID PANEL
Cholesterol: 259 mg/dL — ABNORMAL HIGH (ref 0–200)
HDL: 43.8 mg/dL (ref >39.00)
Total CHOL/HDL Ratio: 6
Triglycerides: 164 mg/dL — ABNORMAL HIGH (ref 0.0–149.0)
VLDL: 32.8 mg/dL (ref 0.0–40.0)

## 2011-05-16 LAB — TSH: TSH: 2.56 u[IU]/mL (ref 0.35–5.50)

## 2011-05-16 LAB — HEMOGLOBIN A1C: Hgb A1c MFr Bld: 6.3 % (ref 4.6–6.5)

## 2011-05-16 LAB — LDL CHOLESTEROL, DIRECT: Direct LDL: 180.3 mg/dL

## 2011-05-30 ENCOUNTER — Ambulatory Visit (INDEPENDENT_AMBULATORY_CARE_PROVIDER_SITE_OTHER): Payer: Medicare Other | Admitting: Internal Medicine

## 2011-05-30 ENCOUNTER — Encounter: Payer: Medicare Other | Admitting: Internal Medicine

## 2011-05-30 ENCOUNTER — Encounter: Payer: Self-pay | Admitting: Internal Medicine

## 2011-05-30 VITALS — BP 140/80 | HR 76 | Temp 98.3°F | Resp 16 | Ht 63.5 in | Wt 199.0 lb

## 2011-05-30 DIAGNOSIS — D649 Anemia, unspecified: Secondary | ICD-10-CM

## 2011-05-30 DIAGNOSIS — Z0000 Encounter for general adult medical examination without abnormal findings: Secondary | ICD-10-CM

## 2011-05-30 DIAGNOSIS — R252 Cramp and spasm: Secondary | ICD-10-CM

## 2011-05-30 MED ORDER — IRON 325 (65 FE) MG PO TABS: 1.0000 | ORAL_TABLET | Freq: Every day | ORAL | Status: DC

## 2011-05-30 MED ORDER — MAGNESIUM OXIDE 400 MG PO TABS: 400.0000 mg | ORAL_TABLET | Freq: Every day | ORAL | Status: AC

## 2011-05-30 NOTE — Patient Instructions (Addendum)
The patient is instructed to continue all medications as prescribed. Schedule followup with check out clerk upon leaving the clinic Take iron 325 one a day  3 magnesium for the leg cramps

## 2011-05-30 NOTE — Progress Notes (Signed)
Subjective:    Patient ID: Tricia [name], female    DOB: 22-Feb-1945, 66 y.o.   MRN: [id number]  HPI CPX and dealing with dying husband... reviewed lipid and HTN management GERD stable Legs cramps Medication non compliance    Review of Systems  Constitutional: Negative for activity change, appetite change and fatigue.  HENT: Negative for ear pain, congestion, neck pain, postnasal drip and sinus pressure.   Eyes: Negative for redness and visual disturbance.  Respiratory: Negative for cough, shortness of breath and wheezing.   Gastrointestinal: Negative for abdominal pain and abdominal distention.  Genitourinary: Negative for dysuria, frequency and menstrual problem.  Musculoskeletal: Negative for myalgias, joint swelling and arthralgias.  Skin: Negative for rash and wound.  Neurological: Negative for dizziness, weakness and headaches.  Hematological: Negative for adenopathy. Does not bruise/bleed easily.  Psychiatric/Behavioral: Negative for sleep disturbance and decreased concentration.   Past Medical History  Diagnosis Date  . CAD (coronary artery disease)   . Hypertension   . Hyperlipidemia   . Obesity   . Anxiety   . Sleep apnea   . Diabetes mellitus   . Dermatophytosis of groin and perianal area   . Anemia   . B12 deficiency   . Osteoporosis   . Depression   . Arthritis   . Low back pain     l5 disc  . Diverticulosis     History   Social History  . Marital Status: Married    Spouse Name: N/A    Number of Children: N/A  . Years of Education: N/A   Occupational History  . retired    Social History Main Topics  . Smoking status: Current Everyday Smoker -- 0.5 packs/day  . Smokeless tobacco: Not on file   Comment: pt started smoking again after stopping for 10 years   . Alcohol Use: No  . Drug Use: No  . Sexually Active: Yes   Other Topics Concern  . Not on file   Social History Narrative   Pt states she has regular exercise    Past Surgical  History  Procedure Date  . Abdominal hysterectomy   . Bariatric surgery   . Coronary angioplasty 2002    2 times  . Appendectomy   . Carpal tunnel release   . Hernia repair   . Tonsillectomy   . Ulnar nerve release   . Total knee arthroplasty   . Nephrectomy   . Lumbar laminectomy     l3-4  . Cholecystectomy   . Tubal ligation     Family History  Problem Relation Age of Onset  . Coronary artery disease    . Colitis Mother     sepsis from c dif colitis  . Heart disease Father     Allergies  Allergen Reactions  . Sulfamethoxazole W/Trimethoprim     REACTION: Reaction not known  . Sulfonamide Derivatives     Current Outpatient Prescriptions on File Prior to Visit  Medication Sig Dispense Refill  . ALPRAZolam (XANAX) 0.25 MG tablet Take 1 tablet (0.25 mg total) by mouth 3 (three) times daily as needed.  60 tablet  5  . aspirin 81 MG tablet Take 81 mg by mouth daily.        Marland Kitchen esomeprazole (NEXIUM) 40 MG capsule Take 40 mg by mouth daily before breakfast.        . FREESTYLE TEST STRIPS test strip USE ONE AS DIRECTED ONE TIME DAILY  100 each  2  . HYDROcodone-acetaminophen (VICODIN) 5-500  MG per tablet Take 1 tablet by mouth every 6 (six) hours as needed.  50 tablet  0  . isosorbide mononitrate (IMDUR) 60 MG 24 hr tablet TAKE ONE TABLET BY MOUTH ONE TIME DAILY  30 tablet  4  . Lancets (FREESTYLE) lancets 1 each by Other route daily. Use as instructed       . metoprolol (LOPRESSOR) 50 MG tablet TAKE ONE TABLET BY MOUTH TWICE DAILY  180 tablet  2  . nitroGLYCERIN (NITROLINGUAL) 0.4 MG/SPRAY spray Place 1 spray under the tongue every 5 (five) minutes as needed for chest pain.  12 g  12  . olmesartan-hydrochlorothiazide (BENICAR HCT) 40-25 MG per tablet Take 1 tablet by mouth daily.        Marland Kitchen omeprazole (PRILOSEC) 20 MG capsule Take 1 capsule (20 mg total) by mouth daily.  30 capsule  1  . rosuvastatin (CRESTOR) 20 MG tablet Take 20 mg by mouth 3 (three) times a week.        .  multivitamin (THERAGRAN) per tablet Take 1 tablet by mouth daily.          BP 140/80  Pulse 76  Temp 98.3 F (36.8 C)  Resp 16  Ht 5' 3.5" (1.613 m)  Wt 199 lb (90.266 kg)  BMI 34.70 kg/m2       Objective:   Physical Exam  Nursing note and vitals reviewed. Constitutional: She is oriented to person, place, and time. She appears well-developed and well-nourished. No distress.  HENT:  Head: Normocephalic and atraumatic.  Right Ear: External ear normal.  Left Ear: External ear normal.  Nose: Nose normal.  Mouth/Throat: Oropharynx is clear and moist.  Eyes: Conjunctivae and EOM are normal. Pupils are equal, round, and reactive to light.  Neck: Normal range of motion. Neck supple. No JVD present. No tracheal deviation present. No thyromegaly present.  Cardiovascular: Normal rate, regular rhythm, normal heart sounds and intact distal pulses.   No murmur heard. Pulmonary/Chest: Effort normal and breath sounds normal. She has no wheezes. She exhibits no tenderness.  Abdominal: Soft. Bowel sounds are normal.  Musculoskeletal: Normal range of motion. She exhibits no edema and no tenderness.  Lymphadenopathy:    She has no cervical adenopathy.  Neurological: She is alert and oriented to person, place, and time. She has normal reflexes. No cranial nerve deficit.  Skin: Skin is warm and dry. She is not diaphoretic.  Psychiatric: She has a normal mood and affect. Her behavior is normal.          Assessment & Plan:   Patient presents for yearly preventative medicine examination.   all immunizations and health maintenance protocols were reviewed with the patient and they are up to date with these protocols.   screening laboratory values were reviewed with the patient including screening of hyperlipidemia PSA renal function and hepatic function.   There medications past medical history social history problem list and allergies were reviewed in detail.   Goals were established with  regard to weight loss exercise diet in compliance with medications   reviewed lipid control.... Had been missing doses and the lipids are out of control Reviewed need to be adherent. The Total increased 100 point  CBC shows signes of iron def anemia  Add magnesium at bed time for leg cramps

## 2011-06-03 ENCOUNTER — Telehealth: Payer: Self-pay | Admitting: Internal Medicine

## 2011-06-03 NOTE — Telephone Encounter (Signed)
Called in.

## 2011-06-03 NOTE — Telephone Encounter (Signed)
Pt called and said that [facility] on [address] has not rcvd either script for Ferrous Sulfate (IRON) 325 (65 FE) MG TABS or to magnesium oxide (MAG-OX 400) 400 MG tablet. Pls call the pharmacist and call the meds in to pharmacy today.

## 2011-06-25 ENCOUNTER — Other Ambulatory Visit: Payer: Self-pay | Admitting: Dermatology

## 2011-07-07 ENCOUNTER — Other Ambulatory Visit: Payer: Self-pay | Admitting: *Deleted

## 2011-07-07 MED ORDER — ALPRAZOLAM 0.25 MG PO TABS: 0.2500 mg | ORAL_TABLET | Freq: Three times a day (TID) | ORAL | Status: DC | PRN

## 2011-07-07 MED ORDER — HYDROCODONE-ACETAMINOPHEN 5-500 MG PO TABS: 1.0000 | ORAL_TABLET | Freq: Four times a day (QID) | ORAL | Status: DC | PRN

## 2011-08-22 ENCOUNTER — Other Ambulatory Visit (INDEPENDENT_AMBULATORY_CARE_PROVIDER_SITE_OTHER): Payer: Medicare Other

## 2011-08-22 DIAGNOSIS — E785 Hyperlipidemia, unspecified: Secondary | ICD-10-CM

## 2011-08-22 LAB — HEPATIC FUNCTION PANEL
ALT: 11 U/L (ref 0–35)
AST: 16 U/L (ref 0–37)
Albumin: 4 g/dL (ref 3.5–5.2)
Alkaline Phosphatase: 104 U/L (ref 39–117)
Bilirubin, Direct: 0 mg/dL (ref 0.0–0.3)
Total Bilirubin: 0.2 mg/dL — ABNORMAL LOW (ref 0.3–1.2)
Total Protein: 7 g/dL (ref 6.0–8.3)

## 2011-08-22 LAB — LIPID PANEL
Cholesterol: 170 mg/dL (ref 0–200)
HDL: 48.7 mg/dL (ref >39.00)
LDL Cholesterol: 94 mg/dL (ref 0–99)
Total CHOL/HDL Ratio: 3
Triglycerides: 135 mg/dL (ref 0.0–149.0)
VLDL: 27 mg/dL (ref 0.0–40.0)

## 2011-08-29 ENCOUNTER — Encounter: Payer: Self-pay | Admitting: Internal Medicine

## 2011-08-29 ENCOUNTER — Ambulatory Visit (INDEPENDENT_AMBULATORY_CARE_PROVIDER_SITE_OTHER): Payer: Medicare Other | Admitting: Internal Medicine

## 2011-08-29 VITALS — BP 140/82 | HR 64 | Temp 98.1°F | Resp 16 | Ht 62.5 in | Wt 202.0 lb

## 2011-08-29 DIAGNOSIS — I10 Essential (primary) hypertension: Secondary | ICD-10-CM

## 2011-08-29 DIAGNOSIS — E785 Hyperlipidemia, unspecified: Secondary | ICD-10-CM

## 2011-08-29 DIAGNOSIS — E669 Obesity, unspecified: Secondary | ICD-10-CM

## 2011-08-29 NOTE — Progress Notes (Signed)
Subjective:    Patient ID: Tricia [name], female    DOB: Jun 10, 1945, 66 y.o.   MRN: [id number]  HPI High risk patient follow up for lipid management reviewed labs reviewed stress with husband who has cancer   Review of Systems  Constitutional: Negative for activity change, appetite change and fatigue.  HENT: Negative for ear pain, congestion, neck pain, postnasal drip and sinus pressure.   Eyes: Negative for redness and visual disturbance.  Respiratory: Negative for cough, shortness of breath and wheezing.   Gastrointestinal: Negative for abdominal pain and abdominal distention.  Genitourinary: Negative for dysuria, frequency and menstrual problem.  Musculoskeletal: Negative for myalgias, joint swelling and arthralgias.  Skin: Negative for rash and wound.  Neurological: Negative for dizziness, weakness and headaches.  Hematological: Negative for adenopathy. Does not bruise/bleed easily.  Psychiatric/Behavioral: Negative for disturbed wake/sleep cycle and decreased concentration.   Past Medical History  Diagnosis Date  . CAD (coronary artery disease)   . Hypertension   . Hyperlipidemia   . Obesity   . Anxiety   . Sleep apnea   . Diabetes mellitus   . Dermatophytosis of groin and perianal area   . Anemia   . B12 deficiency   . Osteoporosis   . Depression   . Arthritis   . Low back pain     l5 disc  . Diverticulosis     History   Social History  . Marital Status: Married    Spouse Name: N/A    Number of Children: N/A  . Years of Education: N/A   Occupational History  . retired    Social History Main Topics  . Smoking status: Current Everyday Smoker -- 0.5 packs/day  . Smokeless tobacco: Not on file   Comment: pt started smoking again after stopping for 10 years   . Alcohol Use: No  . Drug Use: No  . Sexually Active: Yes   Other Topics Concern  . Not on file   Social History Narrative   Pt states she has regular exercise    Past Surgical History    Procedure Date  . Abdominal hysterectomy   . Bariatric surgery   . Coronary angioplasty 2002    2 times  . Appendectomy   . Carpal tunnel release   . Hernia repair   . Tonsillectomy   . Ulnar nerve release   . Total knee arthroplasty   . Nephrectomy   . Lumbar laminectomy     l3-4  . Cholecystectomy   . Tubal ligation     Family History  Problem Relation Age of Onset  . Coronary artery disease    . Colitis Mother     sepsis from c dif colitis  . Heart disease Father     Allergies  Allergen Reactions  . Sulfamethoxazole W-Trimethoprim     REACTION: Reaction not known  . Sulfonamide Derivatives     Current Outpatient Prescriptions on File Prior to Visit  Medication Sig Dispense Refill  . ALPRAZolam (XANAX) 0.25 MG tablet Take 1 tablet (0.25 mg total) by mouth 3 (three) times daily as needed.  60 tablet  5  . aspirin 81 MG tablet Take 81 mg by mouth daily.        Marland Kitchen esomeprazole (NEXIUM) 40 MG capsule Take 40 mg by mouth daily before breakfast.        . Ferrous Sulfate (IRON) 325 (65 FE) MG TABS Take 1 tablet by mouth daily.  30 each  0  . FREESTYLE  TEST STRIPS test strip USE ONE AS DIRECTED ONE TIME DAILY  100 each  2  . HYDROcodone-acetaminophen (VICODIN) 5-500 MG per tablet Take 1 tablet by mouth every 6 (six) hours as needed.  30 tablet  0  . isosorbide mononitrate (IMDUR) 60 MG 24 hr tablet TAKE ONE TABLET BY MOUTH ONE TIME DAILY  30 tablet  4  . Lancets (FREESTYLE) lancets 1 each by Other route daily. Use as instructed       . magnesium oxide (MAG-OX 400) 400 MG tablet Take 1 tablet (400 mg total) by mouth daily.      . metoprolol (LOPRESSOR) 50 MG tablet TAKE ONE TABLET BY MOUTH TWICE DAILY  180 tablet  2  . multivitamin (THERAGRAN) per tablet Take 1 tablet by mouth daily.        Marland Kitchen olmesartan-hydrochlorothiazide (BENICAR HCT) 40-25 MG per tablet Take 1 tablet by mouth daily.        Marland Kitchen omeprazole (PRILOSEC) 20 MG capsule Take 1 capsule (20 mg total) by mouth daily.   30 capsule  1  . rosuvastatin (CRESTOR) 20 MG tablet Take 20 mg by mouth 3 (three) times a week.        Marland Kitchen DISCONTD: nitroGLYCERIN (NITROLINGUAL) 0.4 MG/SPRAY spray Place 1 spray under the tongue every 5 (five) minutes as needed for chest pain.  12 g  12    BP 140/82  Pulse 64  Temp 98.1 F (36.7 C)  Resp 16  Ht 5' 2.5" (1.588 m)  Wt 202 lb (91.627 kg)  BMI 36.36 kg/m2       Objective:   Physical Exam  Nursing note and vitals reviewed. Constitutional: She is oriented to person, place, and time. She appears well-developed and well-nourished. No distress.  HENT:  Head: Normocephalic and atraumatic.  Right Ear: External ear normal.  Left Ear: External ear normal.  Nose: Nose normal.  Mouth/Throat: Oropharynx is clear and moist.  Eyes: Conjunctivae and EOM are normal. Pupils are equal, round, and reactive to light.  Neck: Normal range of motion. Neck supple. No JVD present. No tracheal deviation present. No thyromegaly present.  Cardiovascular: Normal rate, regular rhythm, normal heart sounds and intact distal pulses.   No murmur heard. Pulmonary/Chest: Effort normal and breath sounds normal. She has no wheezes. She exhibits no tenderness.  Abdominal: Soft. Bowel sounds are normal.  Musculoskeletal: Normal range of motion. She exhibits no edema and no tenderness.  Lymphadenopathy:    She has no cervical adenopathy.  Neurological: She is alert and oriented to person, place, and time. She has normal reflexes. No cranial nerve deficit.  Skin: Skin is warm and dry. She is not diaphoretic.  Psychiatric: She has a normal mood and affect. Her behavior is normal.          Assessment & Plan:  Stable blood pressure reveiwed improved lipids Stable on new medication  Samples of medications

## 2011-08-29 NOTE — Patient Instructions (Addendum)
The patient is instructed to continue all medications as prescribed. Schedule followup with check out clerk upon leaving the clinic  

## 2011-09-18 ENCOUNTER — Other Ambulatory Visit: Payer: Self-pay | Admitting: Internal Medicine

## 2011-09-22 ENCOUNTER — Other Ambulatory Visit: Payer: Self-pay | Admitting: Internal Medicine

## 2011-11-14 ENCOUNTER — Other Ambulatory Visit: Payer: Self-pay | Admitting: *Deleted

## 2011-11-14 ENCOUNTER — Telehealth: Payer: Self-pay | Admitting: Family Medicine

## 2011-11-14 ENCOUNTER — Other Ambulatory Visit (INDEPENDENT_AMBULATORY_CARE_PROVIDER_SITE_OTHER): Payer: Medicare Other

## 2011-11-14 DIAGNOSIS — N390 Urinary tract infection, site not specified: Secondary | ICD-10-CM

## 2011-11-14 LAB — POCT URINALYSIS DIPSTICK
Bilirubin, UA: NEGATIVE
Glucose, UA: NEGATIVE
Ketones, UA: NEGATIVE
Nitrite, UA: POSITIVE
Protein, UA: NEGATIVE
Spec Grav, UA: 1.02
Urobilinogen, UA: NEGATIVE
pH, UA: 5

## 2011-11-14 MED ORDER — CIPROFLOXACIN HCL 500 MG PO TABS: 500.0000 mg | ORAL_TABLET | Freq: Two times a day (BID) | ORAL | Status: DC

## 2011-11-14 NOTE — Telephone Encounter (Signed)
Positive ua- per dr Lovell Sheehan- send in cipro 500 bid for 5 days- pt informed and med sent in

## 2011-11-14 NOTE — Telephone Encounter (Signed)
Pt states she has blood in her urine, starting this a.m. She does not want an OV. She wants to come in to the lab and give a sample, and get a rx. Please advise. She would like a call back within 30 minutes, because she'll be leaving to [place] up at the airport. Thank you.

## 2011-11-14 NOTE — Telephone Encounter (Signed)
Coming in this am for ua

## 2011-11-14 NOTE — Addendum Note (Signed)
Addended by: Bonnye Fava on: 11/14/2011 03:46 PM   Modules accepted: Orders

## 2011-11-17 ENCOUNTER — Other Ambulatory Visit: Payer: Medicare Other

## 2011-11-17 LAB — URINE CULTURE: Colony Count: >=100000

## 2011-12-03 ENCOUNTER — Ambulatory Visit: Payer: Medicare Other | Admitting: Internal Medicine

## 2011-12-04 ENCOUNTER — Ambulatory Visit: Payer: Medicare Other | Admitting: Internal Medicine

## 2012-01-29 ENCOUNTER — Other Ambulatory Visit: Payer: Self-pay | Admitting: Internal Medicine

## 2012-02-10 ENCOUNTER — Telehealth: Payer: Self-pay | Admitting: Internal Medicine

## 2012-02-10 NOTE — Telephone Encounter (Signed)
Pt notified that samples are up front.

## 2012-02-10 NOTE — Telephone Encounter (Signed)
Pt's appt had to be resched from Oct to Jan, and now til March 2014. She out of her olmesartan-hydrochlorothiazide (BENICAR HCT) 40-25 MG per tablet and her rosuvastatin (CRESTOR) 20 MG tablet. She has been getting samples of both, and is out. Please call and let her know if she can get more samples, and when she can pick up.

## 2012-02-19 ENCOUNTER — Ambulatory Visit: Payer: Medicare Other | Admitting: Internal Medicine

## 2012-02-23 ENCOUNTER — Ambulatory Visit (INDEPENDENT_AMBULATORY_CARE_PROVIDER_SITE_OTHER): Payer: Medicare Other | Admitting: Internal Medicine

## 2012-02-23 ENCOUNTER — Encounter: Payer: Self-pay | Admitting: Internal Medicine

## 2012-02-23 VITALS — BP 130/70 | HR 76 | Temp 98.2°F | Resp 16 | Ht 62.5 in | Wt 215.0 lb

## 2012-02-23 DIAGNOSIS — E162 Hypoglycemia, unspecified: Secondary | ICD-10-CM

## 2012-02-23 DIAGNOSIS — I10 Essential (primary) hypertension: Secondary | ICD-10-CM

## 2012-02-23 DIAGNOSIS — R3989 Other symptoms and signs involving the genitourinary system: Secondary | ICD-10-CM

## 2012-02-23 DIAGNOSIS — D649 Anemia, unspecified: Secondary | ICD-10-CM

## 2012-02-23 DIAGNOSIS — R399 Unspecified symptoms and signs involving the genitourinary system: Secondary | ICD-10-CM

## 2012-02-23 LAB — CBC WITH DIFFERENTIAL/PLATELET
Basophils Absolute: 0 10*3/uL (ref 0.0–0.1)
Basophils Relative: 0.2 % (ref 0.0–3.0)
Eosinophils Absolute: 0.1 10*3/uL (ref 0.0–0.7)
Eosinophils Relative: 1.8 % (ref 0.0–5.0)
HCT: 44.6 % (ref 36.0–46.0)
Hemoglobin: 15.1 g/dL — ABNORMAL HIGH (ref 12.0–15.0)
Lymphocytes Relative: 20.8 % (ref 12.0–46.0)
Lymphs Abs: 1.6 10*3/uL (ref 0.7–4.0)
MCHC: 33.9 g/dL (ref 30.0–36.0)
MCV: 87.6 fl (ref 78.0–100.0)
Monocytes Absolute: 0.4 10*3/uL (ref 0.1–1.0)
Monocytes Relative: 5.8 % (ref 3.0–12.0)
Neutro Abs: 5.4 10*3/uL (ref 1.4–7.7)
Neutrophils Relative %: 71.4 % (ref 43.0–77.0)
Platelets: 226 10*3/uL (ref 150.0–400.0)
RBC: 5.09 Mil/uL (ref 3.87–5.11)
RDW: 12.5 % (ref 11.5–14.6)
WBC: 7.6 10*3/uL (ref 4.5–10.5)

## 2012-02-23 LAB — HEMOGLOBIN A1C: Hgb A1c MFr Bld: 5.9 % (ref 4.6–6.5)

## 2012-02-23 MED ORDER — CIPROFLOXACIN HCL 500 MG PO TABS: 500.0000 mg | ORAL_TABLET | Freq: Two times a day (BID) | ORAL | Status: DC

## 2012-02-23 NOTE — Addendum Note (Signed)
Addended by: Stacie Glaze on: 02/23/2012 11:10 AM   Modules accepted: Orders

## 2012-02-23 NOTE — Assessment & Plan Note (Signed)
Resumed smoking under sress

## 2012-02-23 NOTE — Progress Notes (Signed)
Subjective:    Patient ID: Tricia [name], female    DOB: 1945/09/24, 67 y.o.   MRN: [id number]  HPI Stress smoking HTN stable Weight increased due to stress DM risk increased and needs to have a1c checked Sleep apnea risk with increased   With 35 pounds of weight gain    Review of Systems  Constitutional: Negative for activity change, appetite change and fatigue.  HENT: Negative for ear pain, congestion, neck pain, postnasal drip and sinus pressure.   Eyes: Negative for redness and visual disturbance.  Respiratory: Negative for cough, shortness of breath and wheezing.   Gastrointestinal: Negative for abdominal pain and abdominal distention.  Genitourinary: Negative for dysuria, frequency and menstrual problem.  Musculoskeletal: Negative for myalgias, joint swelling and arthralgias.  Skin: Negative for rash and wound.  Neurological: Negative for dizziness, weakness and headaches.  Hematological: Negative for adenopathy. Does not bruise/bleed easily.  Psychiatric/Behavioral: Negative for sleep disturbance and decreased concentration.   Past Medical History  Diagnosis Date  . CAD (coronary artery disease)   . Hypertension   . Hyperlipidemia   . Obesity   . Anxiety   . Sleep apnea   . Diabetes mellitus   . Dermatophytosis of groin and perianal area   . Anemia   . B12 deficiency   . Osteoporosis   . Depression   . Arthritis   . Low back pain     l5 disc  . Diverticulosis   . TOBACCO ABUSE 10/05/2009    Qualifier: Diagnosis of  By: Jens Som, MD, Lyn Hollingshead     History   Social History  . Marital Status: Married    Spouse Name: N/A    Number of Children: N/A  . Years of Education: N/A   Occupational History  . retired    Social History Main Topics  . Smoking status: Current Every Day Smoker -- 0.5 packs/day  . Smokeless tobacco: Not on file     Comment: pt started smoking again after stopping for 10 years   . Alcohol Use: No  . Drug Use: No  .  Sexually Active: Yes   Other Topics Concern  . Not on file   Social History Narrative   Pt states she has regular exercise    Past Surgical History  Procedure Date  . Abdominal hysterectomy   . Bariatric surgery   . Coronary angioplasty 2002    2 times  . Appendectomy   . Carpal tunnel release   . Hernia repair   . Tonsillectomy   . Ulnar nerve release   . Total knee arthroplasty   . Nephrectomy   . Lumbar laminectomy     l3-4  . Cholecystectomy   . Tubal ligation     Family History  Problem Relation Age of Onset  . Coronary artery disease    . Colitis Mother     sepsis from c dif colitis  . Heart disease Father     Allergies  Allergen Reactions  . Sulfamethoxazole W-Trimethoprim     REACTION: Reaction not known  . Sulfonamide Derivatives     Current Outpatient Prescriptions on File Prior to Visit  Medication Sig Dispense Refill  . ALPRAZolam (XANAX) 0.25 MG tablet Take 1 tablet (0.25 mg total) by mouth 3 (three) times daily as needed.  60 tablet  5  . aspirin 81 MG tablet Take 81 mg by mouth daily.        . ciprofloxacin (CIPRO) 500 MG tablet Take 1 tablet (  500 mg total) by mouth 2 (two) times daily.  10 tablet  0  . esomeprazole (NEXIUM) 40 MG capsule Take 40 mg by mouth daily before breakfast.        . Ferrous Sulfate (IRON) 325 (65 FE) MG TABS Take 1 tablet by mouth daily.  30 each  0  . FREESTYLE TEST STRIPS test strip USE ONE AS DIRECTED ONE TIME DAILY  100 each  2  . HYDROcodone-acetaminophen (VICODIN) 5-500 MG per tablet Take 1 tablet by mouth every 6 (six) hours as needed.  30 tablet  0  . isosorbide mononitrate (IMDUR) 60 MG 24 hr tablet TAKE ONE TABLET BY MOUTH ONE TIME DAILY  30 tablet  2  . Lancets (FREESTYLE) lancets 1 each by Other route daily. Use as instructed       . magnesium oxide (MAG-OX 400) 400 MG tablet Take 1 tablet (400 mg total) by mouth daily.      . metoprolol (LOPRESSOR) 50 MG tablet TAKE ONE TABLET BY MOUTH TWICE DAILY  180 tablet   1  . multivitamin (THERAGRAN) per tablet Take 1 tablet by mouth daily.        . nitroGLYCERIN (NITROLINGUAL) 0.4 MG/SPRAY spray Place 1 spray under the tongue every 5 (five) minutes as needed.      Marland Kitchen olmesartan-hydrochlorothiazide (BENICAR HCT) 40-25 MG per tablet Take 1 tablet by mouth daily.        Marland Kitchen omeprazole (PRILOSEC) 20 MG capsule Take 20 mg by mouth daily.      . rosuvastatin (CRESTOR) 20 MG tablet Take 20 mg by mouth 3 (three) times a week.          BP 130/70  Pulse 76  Temp 98.2 F (36.8 C)  Resp 16  Ht 5' 2.5" (1.588 m)  Wt 215 lb (97.523 kg)  BMI 38.70 kg/m2       Objective:   Physical Exam  Nursing note and vitals reviewed. Constitutional: She is oriented to person, place, and time. She appears well-developed and well-nourished. No distress.       obesisty  HENT:  Head: Normocephalic and atraumatic.  Right Ear: External ear normal.  Left Ear: External ear normal.  Nose: Nose normal.  Mouth/Throat: Oropharynx is clear and moist.  Eyes: Conjunctivae normal and EOM are normal. Pupils are equal, round, and reactive to light.  Neck: Normal range of motion. Neck supple. No JVD present. No tracheal deviation present. No thyromegaly present.  Cardiovascular: Normal rate and regular rhythm.   Murmur heard. Pulmonary/Chest: Effort normal and breath sounds normal. She has no wheezes. She exhibits no tenderness.  Abdominal: Soft. Bowel sounds are normal.  Musculoskeletal: Normal range of motion. She exhibits no edema and no tenderness.  Lymphadenopathy:    She has no cervical adenopathy.  Neurological: She is alert and oriented to person, place, and time. She has normal reflexes. No cranial nerve deficit.  Skin: Skin is warm and dry. She is not diaphoretic.  Psychiatric: She has a normal mood and affect. Her behavior is normal.          Assessment & Plan:  Portion control and good food choices Walking weight control key to apnea and DM risks Stress  management

## 2012-02-23 NOTE — Patient Instructions (Signed)
The patient is instructed to continue all medications as prescribed. Schedule followup with check out clerk upon leaving the clinic  

## 2012-04-30 ENCOUNTER — Ambulatory Visit: Payer: Medicare Other | Admitting: Internal Medicine

## 2012-05-26 ENCOUNTER — Ambulatory Visit: Payer: Medicare Other | Admitting: Internal Medicine

## 2012-05-28 ENCOUNTER — Ambulatory Visit (INDEPENDENT_AMBULATORY_CARE_PROVIDER_SITE_OTHER): Payer: Medicare Other | Admitting: Internal Medicine

## 2012-05-28 ENCOUNTER — Encounter: Payer: Self-pay | Admitting: Internal Medicine

## 2012-05-28 VITALS — BP 144/80 | HR 76 | Temp 98.0°F | Resp 16 | Ht 62.5 in | Wt 208.0 lb

## 2012-05-28 DIAGNOSIS — R51 Headache: Secondary | ICD-10-CM

## 2012-05-28 DIAGNOSIS — E785 Hyperlipidemia, unspecified: Secondary | ICD-10-CM

## 2012-05-28 DIAGNOSIS — R350 Frequency of micturition: Secondary | ICD-10-CM

## 2012-05-28 DIAGNOSIS — K219 Gastro-esophageal reflux disease without esophagitis: Secondary | ICD-10-CM

## 2012-05-28 DIAGNOSIS — I2510 Atherosclerotic heart disease of native coronary artery without angina pectoris: Secondary | ICD-10-CM

## 2012-05-28 DIAGNOSIS — IMO0001 Reserved for inherently not codable concepts without codable children: Secondary | ICD-10-CM

## 2012-05-28 DIAGNOSIS — R519 Headache, unspecified: Secondary | ICD-10-CM

## 2012-05-28 DIAGNOSIS — I10 Essential (primary) hypertension: Secondary | ICD-10-CM

## 2012-05-28 DIAGNOSIS — F329 Major depressive disorder, single episode, unspecified: Secondary | ICD-10-CM

## 2012-05-28 NOTE — Progress Notes (Signed)
Subjective:    Patient ID: Tricia [name], female    DOB: 1945/11/27, 67 y.o.   MRN: [id number]  HPI Needs a Colon and has increased GERD symptoms Has been suffering Grief reaction due to loss of husband to cancer Blood pressure stable No symptoms of UTI Stable labs last visit   Review of Systems  Constitutional: Negative for activity change, appetite change and fatigue.  HENT: Negative for ear pain, congestion, neck pain, postnasal drip and sinus pressure.   Eyes: Positive for redness. Negative for visual disturbance.  Respiratory: Negative for cough, shortness of breath and wheezing.   Gastrointestinal: Negative for abdominal pain and abdominal distention.  Genitourinary: Negative for dysuria, frequency and menstrual problem.  Musculoskeletal: Negative for myalgias, joint swelling and arthralgias.  Skin: Negative for rash and wound.  Neurological: Negative for dizziness, weakness and headaches.  Hematological: Negative for adenopathy. Does not bruise/bleed easily.  Psychiatric/Behavioral: Negative for sleep disturbance and decreased concentration.   Past Medical History  Diagnosis Date  . CAD (coronary artery disease)   . Hypertension   . Hyperlipidemia   . Obesity   . Anxiety   . Sleep apnea   . Diabetes mellitus   . Dermatophytosis of groin and perianal area   . Anemia   . B12 deficiency   . Osteoporosis   . Depression   . Arthritis   . Low back pain     l5 disc  . Diverticulosis   . TOBACCO ABUSE 10/05/2009    Qualifier: Diagnosis of  By: Jens Som, MD, Lyn Hollingshead     History   Social History  . Marital Status: Married    Spouse Name: N/A    Number of Children: N/A  . Years of Education: N/A   Occupational History  . retired    Social History Main Topics  . Smoking status: Current Every Day Smoker -- 0.50 packs/day  . Smokeless tobacco: Not on file     Comment: pt started smoking again after stopping for 10 years   . Alcohol Use: No  . Drug  Use: No  . Sexually Active: Yes   Other Topics Concern  . Not on file   Social History Narrative   Pt states she has regular exercise    Past Surgical History  Procedure Laterality Date  . Abdominal hysterectomy    . Bariatric surgery    . Coronary angioplasty  2002    2 times  . Appendectomy    . Carpal tunnel release    . Hernia repair    . Tonsillectomy    . Ulnar nerve release    . Total knee arthroplasty    . Nephrectomy    . Lumbar laminectomy      l3-4  . Cholecystectomy    . Tubal ligation      Family History  Problem Relation Age of Onset  . Coronary artery disease    . Colitis Mother     sepsis from c dif colitis  . Heart disease Father     Allergies  Allergen Reactions  . Sulfamethoxazole W-Trimethoprim     REACTION: Reaction not known  . Sulfonamide Derivatives     Current Outpatient Prescriptions on File Prior to Visit  Medication Sig Dispense Refill  . ALPRAZolam (XANAX) 0.25 MG tablet Take 1 tablet (0.25 mg total) by mouth 3 (three) times daily as needed.  60 tablet  5  . aspirin 81 MG tablet Take 81 mg by mouth daily.        Marland Kitchen  Ferrous Sulfate (IRON) 325 (65 FE) MG TABS Take 1 tablet by mouth daily.  30 each  0  . FREESTYLE TEST STRIPS test strip USE ONE AS DIRECTED ONE TIME DAILY  100 each  2  . HYDROcodone-acetaminophen (VICODIN) 5-500 MG per tablet Take 1 tablet by mouth every 6 (six) hours as needed.  30 tablet  0  . isosorbide mononitrate (IMDUR) 60 MG 24 hr tablet TAKE ONE TABLET BY MOUTH ONE TIME DAILY  30 tablet  2  . Lancets (FREESTYLE) lancets 1 each by Other route daily. Use as instructed       . magnesium oxide (MAG-OX 400) 400 MG tablet Take 1 tablet (400 mg total) by mouth daily.      . metoprolol (LOPRESSOR) 50 MG tablet TAKE ONE TABLET BY MOUTH TWICE DAILY  180 tablet  1  . nitroGLYCERIN (NITROLINGUAL) 0.4 MG/SPRAY spray Place 1 spray under the tongue every 5 (five) minutes as needed.      Marland Kitchen olmesartan-hydrochlorothiazide (BENICAR  HCT) 40-25 MG per tablet Take 1 tablet by mouth daily.        Marland Kitchen omeprazole (PRILOSEC) 20 MG capsule Take 20 mg by mouth daily.      . rosuvastatin (CRESTOR) 20 MG tablet Take 20 mg by mouth 3 (three) times a week.         No current facility-administered medications on file prior to visit.    BP 144/80  Pulse 76  Temp(Src) 98 F (36.7 C)  Resp 16  Ht 5' 2.5" (1.588 m)  Wt 208 lb (94.348 kg)  BMI 37.41 kg/m2       Objective:   Physical Exam  Nursing note and vitals reviewed. Constitutional: She is oriented to person, place, and time. She appears well-developed and well-nourished.  HENT:  Head: Normocephalic.  Eyes: Conjunctivae and EOM are normal. Pupils are equal, round, and reactive to light.  Cardiovascular: Normal rate and regular rhythm.   Pulmonary/Chest: Effort normal and breath sounds normal.  Abdominal: Soft. Bowel sounds are normal.  Neurological: She is alert and oriented to person, place, and time.          Assessment & Plan:  Patient is seen for follow up of hypertension and severe GERD Is increasing symptoms of reflux despite use of Prilosec.  We suggested that she increase her Prilosec to 20 mg by mouth twice a day and referral back to gastroenterology she is due for a colonoscopy and it might be appropriate to perform an EGD at this point given her symptomology.  Samples of a medication clear blood pressure medication Crestor given to aid in compliance she is stable her blood pressure.  Today she had a headache which is most probably allergy induced also a component of stress given the fact that she recently lost her husband and has not been in a medical office since his death

## 2012-05-31 LAB — POCT URINALYSIS DIPSTICK
Bilirubin, UA: NEGATIVE
Blood, UA: NEGATIVE
Glucose, UA: NEGATIVE
Ketones, UA: NEGATIVE
Leukocytes, UA: NEGATIVE
Nitrite, UA: NEGATIVE
Protein, UA: NEGATIVE
Spec Grav, UA: 1.015
Urobilinogen, UA: 0.2
pH, UA: 7.5

## 2012-06-04 ENCOUNTER — Telehealth: Payer: Self-pay | Admitting: Internal Medicine

## 2012-06-04 NOTE — Telephone Encounter (Signed)
Dr. Russella Dar will you accept this pt? Please advise.

## 2012-06-04 NOTE — Telephone Encounter (Signed)
Pt would like to switch her care from Dr. Marina Goodell to Dr. Russella Dar, states Dr. Russella Dar took care of her husband last year. Dr. Marina Goodell will you approve the switch?

## 2012-06-04 NOTE — Telephone Encounter (Signed)
Ok with me 

## 2012-06-04 NOTE — Telephone Encounter (Signed)
OK with me.

## 2012-07-21 ENCOUNTER — Other Ambulatory Visit: Payer: Self-pay | Admitting: Internal Medicine

## 2012-07-30 ENCOUNTER — Ambulatory Visit (INDEPENDENT_AMBULATORY_CARE_PROVIDER_SITE_OTHER): Payer: Medicare Other | Admitting: Family Medicine

## 2012-07-30 ENCOUNTER — Encounter: Payer: Self-pay | Admitting: Family Medicine

## 2012-07-30 VITALS — BP 122/70 | HR 72 | Temp 97.6°F | Wt 200.0 lb

## 2012-07-30 DIAGNOSIS — J209 Acute bronchitis, unspecified: Secondary | ICD-10-CM

## 2012-07-30 DIAGNOSIS — J069 Acute upper respiratory infection, unspecified: Secondary | ICD-10-CM

## 2012-07-30 MED ORDER — ALBUTEROL SULFATE HFA 108 (90 BASE) MCG/ACT IN AERS: 2.0000 | INHALATION_SPRAY | Freq: Four times a day (QID) | RESPIRATORY_TRACT | Status: DC | PRN

## 2012-07-30 MED ORDER — AZITHROMYCIN 250 MG PO TABS: ORAL_TABLET | ORAL | Status: DC

## 2012-07-30 MED ORDER — HYDROCODONE-HOMATROPINE 5-1.5 MG/5ML PO SYRP: 5.0000 mL | ORAL_SOLUTION | Freq: Three times a day (TID) | ORAL | Status: DC | PRN

## 2012-07-30 NOTE — Progress Notes (Signed)
Chief Complaint  Patient presents with  . Cough    contestion, fever last week, body ahes, chills     HPI:  Acute visit for cough: -started 1.5 weeks ago -symptoms: cough, drainage, fevers on and off - low grade, chills, body aches -denies: wheezing, SOB, NVD -actually fevers have let up a little, but still with terrible cough   ROS: See pertinent positives and negatives per HPI.  Past Medical History  Diagnosis Date  . CAD (coronary artery disease)   . Hypertension   . Hyperlipidemia   . Obesity   . Anxiety   . Sleep apnea   . Diabetes mellitus   . Dermatophytosis of groin and perianal area   . Anemia   . B12 deficiency   . Osteoporosis   . Depression   . Arthritis   . Low back pain     l5 disc  . Diverticulosis   . TOBACCO ABUSE 10/05/2009    Qualifier: Diagnosis of  By: Jens Som, MD, Lyn Hollingshead     Family History  Problem Relation Age of Onset  . Coronary artery disease    . Colitis Mother     sepsis from c dif colitis  . Heart disease Father     History   Social History  . Marital Status: Married    Spouse Name: N/A    Number of Children: N/A  . Years of Education: N/A   Occupational History  . retired    Social History Main Topics  . Smoking status: Current Every Day Smoker -- 0.50 packs/day  . Smokeless tobacco: None     Comment: pt started smoking again after stopping for 10 years   . Alcohol Use: No  . Drug Use: No  . Sexually Active: Yes   Other Topics Concern  . None   Social History Narrative   Pt states she has regular exercise    Current outpatient prescriptions:ALPRAZolam (XANAX) 0.25 MG tablet, Take 1 tablet (0.25 mg total) by mouth 3 (three) times daily as needed., Disp: 60 tablet, Rfl: 5;  aspirin 81 MG tablet, Take 81 mg by mouth daily.  , Disp: , Rfl: ;  Ferrous Sulfate (IRON) 325 (65 FE) MG TABS, Take 1 tablet by mouth daily., Disp: 30 each, Rfl: 0;  FREESTYLE TEST STRIPS test strip, USE ONE AS DIRECTED ONE TIME  DAILY, Disp: 100 each, Rfl: 2 HYDROcodone-acetaminophen (VICODIN) 5-500 MG per tablet, Take 1 tablet by mouth every 6 (six) hours as needed., Disp: 30 tablet, Rfl: 0;  isosorbide mononitrate (IMDUR) 60 MG 24 hr tablet, TAKE ONE TABLET BY MOUTH ONE TIME DAILY, Disp: 30 tablet, Rfl: 11;  Lancets (FREESTYLE) lancets, 1 each by Other route daily. Use as instructed , Disp: , Rfl: ;  metoprolol (LOPRESSOR) 50 MG tablet, TAKE ONE TABLET BY MOUTH TWICE DAILY, Disp: 180 tablet, Rfl: 1 nitroGLYCERIN (NITROLINGUAL) 0.4 MG/SPRAY spray, Place 1 spray under the tongue every 5 (five) minutes as needed., Disp: , Rfl: ;  olmesartan-hydrochlorothiazide (BENICAR HCT) 40-25 MG per tablet, Take 1 tablet by mouth daily.  , Disp: , Rfl: ;  omeprazole (PRILOSEC) 20 MG capsule, Take 20 mg by mouth daily., Disp: , Rfl: ;  rosuvastatin (CRESTOR) 20 MG tablet, Take 20 mg by mouth 3 (three) times a week.  , Disp: , Rfl:  albuterol (PROVENTIL HFA;VENTOLIN HFA) 108 (90 BASE) MCG/ACT inhaler, Inhale 2 puffs into the lungs every 6 (six) hours as needed for wheezing., Disp: 1 Inhaler, Rfl: 0;  azithromycin (  ZITHROMAX) 250 MG tablet, 2 tabs on first day and then 1 tab daily for 4 days, Disp: 6 tablet, Rfl: 0;  HYDROcodone-homatropine (HYCODAN) 5-1.5 MG/5ML syrup, Take 5 mLs by mouth every 8 (eight) hours as needed for cough., Disp: 120 mL, Rfl: 0  EXAM:  Filed Vitals:   07/30/12 0858  BP: 122/70  Pulse: 72  Temp: 97.6 F (36.4 C)    Body mass index is 35.97 kg/(m^2).  GENERAL: vitals reviewed and listed above, alert, oriented, appears well hydrated and in no acute distress  HEENT: atraumatic, conjunttiva clear, no obvious abnormalities on inspection of external nose and ears, normal appearance of ear canals and TMs, clear nasal congestion, mild post oropharyngeal erythema with PND, no tonsillar edema or exudate, no sinus TTP  NECK: no obvious masses on inspection  LUNGS: few scattered wheezes, no rhonchi or rales  CV: HRRR,  no peripheral edema  MS: moves all extremities without noticeable abnormality  PSYCH: pleasant and cooperative, no obvious depression or anxiety  ASSESSMENT AND PLAN:  Discussed the following assessment and plan:  Acute bronchitis - Plan: HYDROcodone-homatropine (HYCODAN) 5-1.5 MG/5ML syrup, azithromycin (ZITHROMAX) 250 MG tablet, albuterol (PROVENTIL HFA;VENTOLIN HFA) 108 (90 BASE) MCG/ACT inhaler  Upper respiratory infection - Plan: HYDROcodone-homatropine (HYCODAN) 5-1.5 MG/5ML syrup, azithromycin (ZITHROMAX) 250 MG tablet, albuterol (PROVENTIL HFA;VENTOLIN HFA) 108 (90 BASE) MCG/ACT inhaler  -likely viral, but with fevers and wheezing, increased mucus production, smoker - will tx with abx, cough med and alb -risks of meds and return precautions discussed -Patient advised to return or notify a doctor immediately if symptoms worsen or persist or new concerns arise.  There are no Patient Instructions on file for this visit.   Kriste Basque R.

## 2012-08-10 ENCOUNTER — Encounter: Payer: Self-pay | Admitting: Gastroenterology

## 2012-08-13 ENCOUNTER — Encounter: Payer: Self-pay | Admitting: Internal Medicine

## 2012-08-19 ENCOUNTER — Ambulatory Visit (INDEPENDENT_AMBULATORY_CARE_PROVIDER_SITE_OTHER): Payer: Medicare Other | Admitting: Gastroenterology

## 2012-08-19 ENCOUNTER — Encounter: Payer: Self-pay | Admitting: Gastroenterology

## 2012-08-19 VITALS — BP 100/62 | HR 60 | Ht 63.0 in | Wt 200.5 lb

## 2012-08-19 DIAGNOSIS — K219 Gastro-esophageal reflux disease without esophagitis: Secondary | ICD-10-CM

## 2012-08-19 DIAGNOSIS — Z86010 Personal history of colonic polyps: Secondary | ICD-10-CM

## 2012-08-19 MED ORDER — OMEPRAZOLE 40 MG PO CPDR: 40.0000 mg | DELAYED_RELEASE_CAPSULE | Freq: Every day | ORAL | Status: DC

## 2012-08-19 MED ORDER — PEG-KCL-NACL-NASULF-NA ASC-C 100 G PO SOLR: 1.0000 | Freq: Once | ORAL | Status: DC

## 2012-08-19 NOTE — Patient Instructions (Addendum)
You have been scheduled for an endoscopy and colonoscopy with propofol. Please follow the written instructions given to you at your visit today. Please pick up your prep at the pharmacy within the next 1-3 days. If you use inhalers (even only as needed), please bring them with you on the day of your procedure. Your physician has requested that you go to www.startemmi.com and enter the access code given to you at your visit today. This web site gives a general overview about your procedure. However, you should still follow specific instructions given to you by our office regarding your preparation for the procedure.  We have sent the following medications to your pharmacy for you to pick up at your convenience: omeprazole 40 mg.  Patient advised to avoid spicy, acidic, citrus, chocolate, mints, fruit and fruit juices.  Limit the intake of caffeine, alcohol and Soda.  Don't exercise too soon after eating.  Don't lie down within 3-4 hours of eating.  Elevate the head of your bed.  Thank you for choosing me and [facility].  Venita Lick. Pleas Koch., MD., Clementeen Graham

## 2012-08-19 NOTE — Progress Notes (Signed)
History of Present Illness: This is a 67 year old female with a history of adenomatous colon polyps. She previously underwent colonoscopy by Dr. Yancey Flemings in January 2006 for routine screening. 2 adenomatous colon polyps were removed. She's not had followup colonoscopy. She recently requested to change physicians as I care for her husband. She states she has had significant reflux symptoms for the past several years and was taking omeprazole 20 mg daily which did not adequately control her symptoms. She noted significant regurgitation and reflux at nighttime. She increased her omeprazole 40 mg daily and avoid foods for 3 or 4 are as before bedtime and her symptoms have improved over the past 2 months. Denies weight loss, abdominal pain, constipation, diarrhea, change in stool caliber, melena, hematochezia, nausea, vomiting, dysphagia, chest pain.  Review of Systems: Pertinent positive and negative review of systems were noted in the above HPI section. All other review of systems were otherwise negative.  Current Medications, Allergies, Past Medical History, Past Surgical History, Family History and Social History were reviewed in [employer] record.  Physical Exam: General: Well developed , well nourished, no acute distress Head: Normocephalic and atraumatic Eyes:  sclerae anicteric, EOMI Ears: Normal auditory acuity Mouth: No deformity or lesions Neck: Supple, no masses or thyromegaly Lungs: Clear throughout to auscultation Heart: Regular rate and rhythm; no murmurs, rubs or bruits Abdomen: Soft, non tender and non distended. No masses, hepatosplenomegaly or hernias noted. Normal Bowel sounds Rectal: Deferred to colonoscopy Musculoskeletal: Symmetrical with no gross deformities  Skin: No lesions on visible extremities Pulses:  Normal pulses noted Extremities: No clubbing, cyanosis, edema or deformities noted Neurological: Alert oriented x 4, grossly nonfocal Cervical  Nodes:  No significant cervical adenopathy Inguinal Nodes: No significant inguinal adenopathy Psychological:  Alert and cooperative. Normal mood and affect  Assessment and Recommendations:  1. Personal history of adenomatous colon polyps, initially diagnosed in 2006. She is overdue for surveillance colonoscopy. Schedule colonoscopy. The risks, benefits, and alternatives to colonoscopy with possible biopsy and possible polypectomy were discussed with the patient and they consent to proceed.   2. GERD. Continue standard antireflux measures and omeprazole 40 mg every morning. Schedule endoscopy to screen for esophagitis and Barrett's. The risks, benefits, and alternatives to endoscopy with possible biopsy and possible dilation were discussed with the patient and they consent to proceed.

## 2012-09-20 ENCOUNTER — Encounter: Payer: Self-pay | Admitting: Internal Medicine

## 2012-09-20 ENCOUNTER — Ambulatory Visit (INDEPENDENT_AMBULATORY_CARE_PROVIDER_SITE_OTHER): Payer: Medicare Other | Admitting: Internal Medicine

## 2012-09-20 VITALS — BP 120/70 | HR 72 | Temp 98.2°F | Resp 16 | Ht 63.0 in | Wt 202.0 lb

## 2012-09-20 DIAGNOSIS — R300 Dysuria: Secondary | ICD-10-CM

## 2012-09-20 DIAGNOSIS — I10 Essential (primary) hypertension: Secondary | ICD-10-CM

## 2012-09-20 DIAGNOSIS — IMO0001 Reserved for inherently not codable concepts without codable children: Secondary | ICD-10-CM

## 2012-09-20 DIAGNOSIS — T887XXA Unspecified adverse effect of drug or medicament, initial encounter: Secondary | ICD-10-CM

## 2012-09-20 DIAGNOSIS — E785 Hyperlipidemia, unspecified: Secondary | ICD-10-CM

## 2012-09-20 LAB — HEPATIC FUNCTION PANEL
ALT: 10 U/L (ref 0–35)
AST: 15 U/L (ref 0–37)
Albumin: 3.8 g/dL (ref 3.5–5.2)
Alkaline Phosphatase: 114 U/L (ref 39–117)
Bilirubin, Direct: 0 mg/dL (ref 0.0–0.3)
Total Bilirubin: 0.7 mg/dL (ref 0.3–1.2)
Total Protein: 6.8 g/dL (ref 6.0–8.3)

## 2012-09-20 LAB — POCT URINALYSIS DIPSTICK
Bilirubin, UA: NEGATIVE
Blood, UA: NEGATIVE
Glucose, UA: NEGATIVE
Ketones, UA: NEGATIVE
Nitrite, UA: NEGATIVE
Protein, UA: NEGATIVE
Spec Grav, UA: 1.015
Urobilinogen, UA: 0.2
pH, UA: 6

## 2012-09-20 LAB — HEMOGLOBIN A1C: Hgb A1c MFr Bld: 6 % (ref 4.6–6.5)

## 2012-09-20 NOTE — Patient Instructions (Signed)
The patient is instructed to continue all medications as prescribed. Schedule followup with check out clerk upon leaving the clinic  

## 2012-09-20 NOTE — Progress Notes (Signed)
  Subjective:    Patient ID: Tricia [name], female    DOB: February 13, 1945, 67 y.o.   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  HENT: Negative for ear pain, congestion, neck pain, postnasal drip and sinus pressure.   Eyes: Negative for redness and visual disturbance.  Respiratory: Negative for cough, shortness of breath and wheezing.   Gastrointestinal: Negative for abdominal pain and abdominal distention.  Genitourinary: Negative for dysuria, frequency and menstrual problem.  Musculoskeletal: Negative for myalgias, joint swelling and arthralgias.  Skin: Negative for rash and wound.  Neurological: Negative for dizziness, weakness and headaches.  Hematological: Negative for adenopathy. Does not bruise/bleed easily.  Psychiatric/Behavioral: Negative for sleep disturbance and decreased concentration.       Objective:   Physical Exam  Constitutional: She is oriented to person, place, and time. She appears well-developed and well-nourished. No distress.  HENT:  Head: Normocephalic and atraumatic.  Eyes: Conjunctivae and EOM are normal. Pupils are equal, round, and reactive to light.  Neck: Normal range of motion. Neck supple. No JVD present. No tracheal deviation present. No thyromegaly present.  Cardiovascular: Normal rate and regular rhythm.   Murmur heard. Pulmonary/Chest: Effort normal and breath sounds normal. She has no wheezes. She exhibits no tenderness.  Abdominal: Soft. Bowel sounds are normal.  Musculoskeletal: Normal range of motion. She exhibits no edema and no tenderness.  Lymphadenopathy:    She has no cervical adenopathy.  Neurological: She is alert and oriented to person, place, and time. She has normal reflexes. No cranial nerve deficit.  Skin: Skin is warm and dry.  She is not diaphoretic.  Psychiatric: She has a normal mood and affect. Her behavior is normal.          Assessment & Plan:  Stable HTN Persistent back pain Lipid monitoring CPX needed

## 2012-09-22 LAB — LDL CHOLESTEROL, DIRECT: Direct LDL: 160.7 mg/dL

## 2012-10-12 ENCOUNTER — Ambulatory Visit (AMBULATORY_SURGERY_CENTER): Payer: Medicare Other | Admitting: Gastroenterology

## 2012-10-12 ENCOUNTER — Encounter: Payer: Self-pay | Admitting: Gastroenterology

## 2012-10-12 VITALS — BP 110/56 | HR 52 | Temp 98.3°F | Resp 19 | Ht 63.0 in | Wt 200.0 lb

## 2012-10-12 DIAGNOSIS — D126 Benign neoplasm of colon, unspecified: Secondary | ICD-10-CM

## 2012-10-12 DIAGNOSIS — Z86010 Personal history of colonic polyps: Secondary | ICD-10-CM

## 2012-10-12 DIAGNOSIS — K219 Gastro-esophageal reflux disease without esophagitis: Secondary | ICD-10-CM

## 2012-10-12 MED ORDER — SODIUM CHLORIDE 0.9 % IV SOLN: 500.0000 mL | INTRAVENOUS | Status: DC

## 2012-10-12 NOTE — Patient Instructions (Addendum)

## 2012-10-12 NOTE — Progress Notes (Signed)
Patient did not experience any of the following events: a burn prior to discharge; a fall within the facility; wrong site/side/patient/procedure/implant event; or a hospital transfer or hospital admission upon discharge from the facility. (G8907) Patient did not have preoperative order for IV antibiotic SSI prophylaxis. (G8918)  

## 2012-10-12 NOTE — Progress Notes (Signed)
[facility]ippa protocol followed information sealed in envelope and given to pt.

## 2012-10-12 NOTE — Op Note (Addendum)
[facility] [address] [place], [address]   COLONOSCOPY PROCEDURE REPORT PATIENT: Tricia [name], Tricia [name]  MR#: [id number] BIRTHDATE: [birth year] , 67  yrs. old GENDER: Female ENDOSCOPIST: Meryl Dare, MD, [facility] PROCEDURE DATE:  10/12/2012 PROCEDURE:   Colonoscopy with biopsy and snare polypectomy First Screening Colonoscopy - Avg.  risk and is 50 yrs.  old or older - No.  Prior Negative Screening - Now for repeat screening. N/A  History of Adenoma - Now for follow-up colonoscopy & has been > or = to 3 yrs.  Yes hx of adenoma.  Has been 3 or more years since last colonoscopy.  Polyps Removed Today? Yes. ASA CLASS:   Class II INDICATIONS:Patient's personal history of adenomatous colon polyps.  MEDICATIONS: MAC sedation, administered by CRNA and propofol (Diprivan) 260mg  IV DESCRIPTION OF PROCEDURE:   After the risks benefits and alternatives of the procedure were thoroughly explained, informed consent was obtained.  A digital rectal exam revealed no abnormalities of the rectum.   The LB [id number]  endoscope was introduced through the anus and advanced to the cecum, which was identified by both the appendix and ileocecal valve. No adverse events experienced with a tortuous colon.   The quality of the prep was excellent, using MoviPrep  The instrument was then slowly withdrawn as the colon was fully examined.  COLON FINDINGS: A sessile polyp measuring 3 mm in size was found at the cecum.  A polypectomy was performed with cold forceps.  The resection was complete and the polyp tissue was completely retrieved.  Two sessile polyps measuring 3-7 mm in size were found in the ascending colon.  A polypectomy was performed with a cold snare and with cold forceps.   Two sessile polyps measuring 6 mm in size were found in the transverse colon.  A polypectomy was performed with a cold snare.  The resection was complete and the polyp tissue was completely retrieved. A  sessile polyp measuring 6 mm in size was found in the sigmoid colon. A polypectomy was performed with a cold snare.  The resection was complete and the polyp tissue was completely retrieved.  Mild diverticulosis was noted.   The colon was otherwise normal. There was no diverticulosis, inflammation, polyps or cancers unless previously stated.  Retroflexed views revealed small internal hemorrhoids. The time to cecum=6 minutes 18 seconds.  Withdrawal time=16 minutes 33 seconds.  The scope was withdrawn and the procedure completed. COMPLICATIONS: There were no complications. ENDOSCOPIC IMPRESSION: 1.   Sessile polyp measuring 3 mm at the cecum; polypectomy performed with cold forceps 2.   Two sessile polyps measuring 3-7 mm in the ascending colon; polypectomy performed with a cold snare and with cold forceps 3.   Two sessile polyps measuring 6 mm in the transverse colon; polypectomy performed with a cold snare 4.   Sessile polyp measuring 6 mm in the sigmoid colon; polypectomy performed with a cold snare 5.   Mild diverticulosis was noted 6.   Small internal hemorrhoids RECOMMENDATIONS: 1.  Await pathology results 2.  Repeat colonoscopy in 5 years 3.  High fiber diet with liberal fluid intake eSigned:  Meryl Dare, MD, [facility] 10/12/2012 12:12 PM Revised: 10/12/2012 12:12 PM    PATIENT NAME:  Tricia [name], Tricia [name] MR#: [id number]

## 2012-10-12 NOTE — Op Note (Signed)
[facility] [address] [place], [address]   ENDOSCOPY PROCEDURE REPORT  PATIENT: Tricia [name], Tricia [name]  MR#: [id number] BIRTHDATE: 02/08/1945 , 67  yrs. old GENDER: Female ENDOSCOPIST: Meryl Dare, MD, [facility] PROCEDURE DATE:  10/12/2012 PROCEDURE:  EGD, diagnostic ASA CLASS:     Class II INDICATIONS:  History of esophageal reflux.   Barrett's screening. MEDICATIONS: residual sedation effect present from prior procedure, MAC sedation, administered by CRNA, propofol (Diprivan) 70mg  IV TOPICAL ANESTHETIC: none DESCRIPTION OF PROCEDURE: After the risks benefits and alternatives of the procedure were thoroughly explained, informed consent was obtained.  The LB [id number] endoscope was introduced through the mouth and advanced to the second portion of the duodenum  without limitations.  The instrument was slowly withdrawn as the mucosa was fully examined.  ESOPHAGUS: The mucosa of the esophagus appeared normal. 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 The stomach otherwise appeared normal. DUODENUM: The duodenal mucosa showed no abnormalities in the 2nd part of the duodenum and 3rd part of the duodenum.  Retroflexed views revealed no abnormalities.  The scope was then withdrawn from the patient and the procedure completed.  COMPLICATIONS: There were no complications.  ENDOSCOPIC IMPRESSION: 1.   Prior gastric bypass surgery; otherwise normal  RECOMMENDATIONS: 1.  Anti-reflux regimen 2.  Continue PPI  eSigned:  Meryl Dare, MD, [facility] 10/12/2012 12:11 PM

## 2012-10-12 NOTE — Progress Notes (Signed)
Report to pacu rn, vss, bbs=clear 

## 2012-10-12 NOTE — Progress Notes (Signed)
Called to room to assist during endoscopic procedure.  Patient ID and intended procedure confirmed with present staff. Received instructions for my participation in the procedure from the performing physician. ewm 

## 2012-10-13 ENCOUNTER — Telehealth: Payer: Self-pay

## 2012-10-13 NOTE — Telephone Encounter (Signed)
  Follow up Call-  Call back number 10/12/2012  Post procedure Call Back phone  # [phone]  Permission to leave phone message Yes     Patient questions:  Do you have a fever, pain , or abdominal swelling? no Pain Score  0 *  Have you tolerated food without any problems? no  Have you been able to return to your normal activities? yes  Do you have any questions about your discharge instructions: Diet   no Medications  no Follow up visit  no  Do you have questions or concerns about your Care? no  Actions: * If pain score is 4 or above: No action needed, pain <4.  Per the pt, "I am doing just fine". Maw

## 2012-10-18 ENCOUNTER — Encounter: Payer: Self-pay | Admitting: Gastroenterology

## 2012-12-09 ENCOUNTER — Other Ambulatory Visit: Payer: Self-pay

## 2013-01-14 ENCOUNTER — Other Ambulatory Visit: Payer: Self-pay | Admitting: *Deleted

## 2013-01-14 ENCOUNTER — Telehealth: Payer: Self-pay | Admitting: Internal Medicine

## 2013-01-14 MED ORDER — HYDROCODONE-ACETAMINOPHEN 5-500 MG PO TABS: 1.0000 | ORAL_TABLET | Freq: Four times a day (QID) | ORAL | Status: DC | PRN

## 2013-01-14 NOTE — Telephone Encounter (Signed)
Pt request samples of rx for olmesartan-hydrochlorothiazide (BENICAR HCT) 40-25 MG per tablet Prefer enough samples for the rest of the year. Will have insuranace in jan. Also rx for HYDROcodone-acetaminophen (VICODIN) 5-325 MG per tablet. Pt states she is having back pain/nerve problems and is waiting to made appt w/ neurologist.

## 2013-01-14 NOTE — Telephone Encounter (Signed)
Pt informed samples ready for pickup and script is ready for pick up

## 2013-01-14 NOTE — Telephone Encounter (Signed)
done

## 2013-01-18 ENCOUNTER — Other Ambulatory Visit: Payer: Self-pay | Admitting: *Deleted

## 2013-01-18 ENCOUNTER — Telehealth: Payer: Self-pay | Admitting: Internal Medicine

## 2013-01-18 MED ORDER — FREESTYLE LANCETS MISC: 1.0000 | Freq: Two times a day (BID) | Status: DC

## 2013-01-18 MED ORDER — HYDROCODONE-ACETAMINOPHEN 5-325 MG PO TABS: 1.0000 | ORAL_TABLET | Freq: Four times a day (QID) | ORAL | Status: DC | PRN

## 2013-01-18 NOTE — Telephone Encounter (Signed)
Pt states she was given was Hydrocodone 5-500   Pt al  [facility]

## 2013-01-18 NOTE — Telephone Encounter (Signed)
Pt informed- new script is ready for pick up and lancets sent to pharmacy

## 2013-01-18 NOTE — Telephone Encounter (Signed)
Pt states she was given rx for HYDROcodone-acetaminophen (VICODIN) 5-500 MG per tablet.  However, pharmacy would not fill because this dose is no longer available.  Pt requesting a new script with appropriate dose.  Pt will bring the old script back when she picks up new one.  She is also requesting a script for lancets.  Pharmacy: [facility].

## 2013-01-19 ENCOUNTER — Other Ambulatory Visit: Payer: Self-pay | Admitting: *Deleted

## 2013-01-19 NOTE — Telephone Encounter (Signed)
[facility] to find [facility] premium glucometer lancets or strips- written on script [facility]

## 2013-02-10 ENCOUNTER — Other Ambulatory Visit: Payer: Self-pay | Admitting: Neurosurgery

## 2013-02-10 DIAGNOSIS — M5137 Other intervertebral disc degeneration, lumbosacral region: Secondary | ICD-10-CM

## 2013-02-11 ENCOUNTER — Ambulatory Visit
Admission: RE | Admit: 2013-02-11 | Discharge: 2013-02-11 | Disposition: A | Discharge status: Home / Self Care | Payer: Medicare Other | Source: Ambulatory Visit | Attending: Neurosurgery | Admitting: Neurosurgery

## 2013-02-11 ENCOUNTER — Other Ambulatory Visit: Payer: Self-pay | Admitting: Neurosurgery

## 2013-02-11 VITALS — BP 98/53 | HR 56

## 2013-02-11 DIAGNOSIS — R252 Cramp and spasm: Secondary | ICD-10-CM

## 2013-02-11 DIAGNOSIS — M5137 Other intervertebral disc degeneration, lumbosacral region: Secondary | ICD-10-CM

## 2013-02-11 DIAGNOSIS — M549 Dorsalgia, unspecified: Secondary | ICD-10-CM

## 2013-02-11 MED ORDER — DIAZEPAM 5 MG PO TABS
5.0000 mg | ORAL_TABLET | Freq: Once | ORAL | Status: AC
  Administered 2013-02-11: 5 mg via ORAL

## 2013-02-11 MED ORDER — ONDANSETRON HCL 4 MG/2ML IJ SOLN
4.0000 mg | Freq: Once | INTRAMUSCULAR | Status: AC
  Administered 2013-02-11: 4 mg via INTRAMUSCULAR

## 2013-02-11 MED ORDER — IOHEXOL 180 MG/ML  SOLN
17.0000 mL | Freq: Once | INTRAMUSCULAR | Status: AC | PRN
  Administered 2013-02-11: 17 mL via INTRATHECAL

## 2013-02-11 MED ORDER — MEPERIDINE HCL 100 MG/ML IJ SOLN
75.0000 mg | Freq: Once | INTRAMUSCULAR | Status: AC
  Administered 2013-02-11: 75 mg via INTRAMUSCULAR

## 2013-02-11 NOTE — Discharge Instructions (Signed)

## 2013-02-18 ENCOUNTER — Telehealth: Payer: Self-pay | Admitting: Internal Medicine

## 2013-02-18 ENCOUNTER — Other Ambulatory Visit: Payer: Self-pay | Admitting: *Deleted

## 2013-02-18 MED ORDER — OLMESARTAN MEDOXOMIL-HCTZ 40-25 MG PO TABS: 1.0000 | ORAL_TABLET | Freq: Every day | ORAL | Status: DC

## 2013-02-18 NOTE — Telephone Encounter (Signed)
Requesting Benicar samples, if available.  If not please send a rx to [facility].

## 2013-02-18 NOTE — Telephone Encounter (Signed)
None available- pt advised will call to pharmacy

## 2013-04-14 ENCOUNTER — Other Ambulatory Visit: Payer: Self-pay | Admitting: Internal Medicine

## 2013-06-22 ENCOUNTER — Encounter: Payer: Medicare Other | Admitting: Internal Medicine

## 2013-07-22 ENCOUNTER — Other Ambulatory Visit: Payer: Self-pay | Admitting: Internal Medicine

## 2013-08-12 ENCOUNTER — Telehealth: Payer: Self-pay | Admitting: Internal Medicine

## 2013-08-12 MED ORDER — GLUCOSE BLOOD VI STRP: 1.0000 | ORAL_STRIP | Freq: Every day | Status: DC

## 2013-08-12 NOTE — Telephone Encounter (Signed)
[id number] - [place], [place] - [address] is requesting re-fill on [facility] BLOOD GLUCOSE TEST STRIP,QTY 100, test blood glucose once daily

## 2013-08-12 NOTE — Telephone Encounter (Signed)
rx sent in electronically 

## 2013-08-19 ENCOUNTER — Other Ambulatory Visit (INDEPENDENT_AMBULATORY_CARE_PROVIDER_SITE_OTHER): Payer: Medicare Other

## 2013-08-19 DIAGNOSIS — E162 Hypoglycemia, unspecified: Secondary | ICD-10-CM

## 2013-08-19 DIAGNOSIS — Z0000 Encounter for general adult medical examination without abnormal findings: Secondary | ICD-10-CM

## 2013-08-19 DIAGNOSIS — N390 Urinary tract infection, site not specified: Secondary | ICD-10-CM

## 2013-08-19 DIAGNOSIS — I10 Essential (primary) hypertension: Secondary | ICD-10-CM

## 2013-08-19 DIAGNOSIS — E785 Hyperlipidemia, unspecified: Secondary | ICD-10-CM

## 2013-08-19 LAB — LIPID PANEL
Cholesterol: 223 mg/dL — ABNORMAL HIGH (ref 0–200)
HDL: 37.1 mg/dL — ABNORMAL LOW (ref >39.00)
LDL Cholesterol: 142 mg/dL — ABNORMAL HIGH (ref 0–99)
NonHDL: 185.9
Total CHOL/HDL Ratio: 6
Triglycerides: 220 mg/dL — ABNORMAL HIGH (ref 0.0–149.0)
VLDL: 44 mg/dL — ABNORMAL HIGH (ref 0.0–40.0)

## 2013-08-19 LAB — BASIC METABOLIC PANEL
BUN: 21 mg/dL (ref 6–23)
CO2: 26 mEq/L (ref 19–32)
Calcium: 9.7 mg/dL (ref 8.4–10.5)
Chloride: 105 mEq/L (ref 96–112)
Creatinine, Ser: 1 mg/dL (ref 0.4–1.2)
GFR: 62.56 mL/min (ref >60.00)
Glucose, Bld: 97 mg/dL (ref 70–99)
Potassium: 4.1 mEq/L (ref 3.5–5.1)
Sodium: 141 mEq/L (ref 135–145)

## 2013-08-19 LAB — CBC WITH DIFFERENTIAL/PLATELET
Basophils Absolute: 0 10*3/uL (ref 0.0–0.1)
Basophils Relative: 0.4 % (ref 0.0–3.0)
Eosinophils Absolute: 0.1 10*3/uL (ref 0.0–0.7)
Eosinophils Relative: 1.2 % (ref 0.0–5.0)
HCT: 45.8 % (ref 36.0–46.0)
Hemoglobin: 15.6 g/dL — ABNORMAL HIGH (ref 12.0–15.0)
Lymphocytes Relative: 21.1 % (ref 12.0–46.0)
Lymphs Abs: 1.3 10*3/uL (ref 0.7–4.0)
MCHC: 34 g/dL (ref 30.0–36.0)
MCV: 85.9 fl (ref 78.0–100.0)
Monocytes Absolute: 0.3 10*3/uL (ref 0.1–1.0)
Monocytes Relative: 5.2 % (ref 3.0–12.0)
Neutro Abs: 4.6 10*3/uL (ref 1.4–7.7)
Neutrophils Relative %: 72.1 % (ref 43.0–77.0)
Platelets: 211 10*3/uL (ref 150.0–400.0)
RBC: 5.33 Mil/uL — ABNORMAL HIGH (ref 3.87–5.11)
RDW: 12.5 % (ref 11.5–15.5)
WBC: 6.3 10*3/uL (ref 4.0–10.5)

## 2013-08-19 LAB — HEPATIC FUNCTION PANEL
ALT: 8 U/L (ref 0–35)
AST: 13 U/L (ref 0–37)
Albumin: 3.6 g/dL (ref 3.5–5.2)
Alkaline Phosphatase: 156 U/L — ABNORMAL HIGH (ref 39–117)
Bilirubin, Direct: 0.1 mg/dL (ref 0.0–0.3)
Total Bilirubin: 0.4 mg/dL (ref 0.2–1.2)
Total Protein: 6.7 g/dL (ref 6.0–8.3)

## 2013-08-19 LAB — HEMOGLOBIN A1C: Hgb A1c MFr Bld: 6.1 % (ref 4.6–6.5)

## 2013-08-19 LAB — TSH: TSH: 2.91 u[IU]/mL (ref 0.35–4.50)

## 2013-08-19 LAB — POCT URINALYSIS DIPSTICK
Bilirubin, UA: NEGATIVE
Glucose, UA: NEGATIVE
Ketones, UA: NEGATIVE
Nitrite, UA: POSITIVE
Spec Grav, UA: 1.02
Urobilinogen, UA: 0.2
pH, UA: 5.5

## 2013-08-19 LAB — MICROALBUMIN / CREATININE URINE RATIO
Creatinine,U: 107.3 mg/dL
Microalb Creat Ratio: 3.9 mg/g (ref 0.0–30.0)
Microalb, Ur: 4.2 mg/dL — ABNORMAL HIGH (ref 0.0–1.9)

## 2013-08-19 NOTE — Addendum Note (Signed)
Addended by: Elmer Picker on: 08/19/2013 10:57 AM   Modules accepted: Orders

## 2013-08-24 LAB — URINE CULTURE: Colony Count: >=100000

## 2013-08-26 ENCOUNTER — Ambulatory Visit (INDEPENDENT_AMBULATORY_CARE_PROVIDER_SITE_OTHER): Payer: Medicare Other | Admitting: Internal Medicine

## 2013-08-26 ENCOUNTER — Encounter: Payer: Self-pay | Admitting: Internal Medicine

## 2013-08-26 VITALS — BP 110/80 | HR 64 | Temp 97.3°F | Ht 62.5 in | Wt 215.0 lb

## 2013-08-26 DIAGNOSIS — Z0000 Encounter for general adult medical examination without abnormal findings: Secondary | ICD-10-CM

## 2013-08-26 DIAGNOSIS — I10 Essential (primary) hypertension: Secondary | ICD-10-CM

## 2013-08-26 DIAGNOSIS — Z23 Encounter for immunization: Secondary | ICD-10-CM

## 2013-08-26 MED ORDER — HYDROCODONE-ACETAMINOPHEN 5-325 MG PO TABS: 1.0000 | ORAL_TABLET | Freq: Four times a day (QID) | ORAL | Status: DC | PRN

## 2013-08-26 MED ORDER — NITROGLYCERIN 0.4 MG/SPRAY TL SOLN: 1.0000 | Status: DC | PRN

## 2013-08-26 MED ORDER — PRAVASTATIN SODIUM 40 MG PO TABS: 40.0000 mg | ORAL_TABLET | Freq: Every day | ORAL | Status: DC

## 2013-08-26 MED ORDER — NITROFURANTOIN MONOHYD MACRO 100 MG PO CAPS
100.0000 mg | ORAL_CAPSULE | Freq: Two times a day (BID) | ORAL | Status: DC
Start: 2013-08-26 — End: 2014-02-14

## 2013-08-26 MED ORDER — ALPRAZOLAM 0.25 MG PO TABS: 0.2500 mg | ORAL_TABLET | Freq: Three times a day (TID) | ORAL | Status: DC | PRN

## 2013-08-26 NOTE — Patient Instructions (Signed)
The patient is instructed to continue all medications as prescribed. Schedule followup with check out clerk upon leaving the clinic  

## 2013-08-26 NOTE — Progress Notes (Signed)
Subjective:    Patient ID: Tricia [name], female    DOB: 1945/04/25, 68 y.o.   MRN: [id number]  HPI Asthma/ COPD HTN GERD  Chronic issues   Review of Systems  Constitutional: Positive for fatigue. Negative for activity change and appetite change.  HENT: Negative for congestion, ear pain, postnasal drip and sinus pressure.   Eyes: Negative for redness and visual disturbance.  Respiratory: Positive for shortness of breath. Negative for cough and wheezing.   Gastrointestinal: Negative for abdominal pain and abdominal distention.  Genitourinary: Negative for dysuria, frequency and menstrual problem.  Musculoskeletal: Negative for arthralgias, joint swelling, myalgias and neck pain.  Skin: Negative for rash and wound.  Neurological: Negative for dizziness, weakness and headaches.  Hematological: Negative for adenopathy. Does not bruise/bleed easily.  Psychiatric/Behavioral: Negative for sleep disturbance and decreased concentration.   Past Medical History  Diagnosis Date  . CAD (coronary artery disease)   . Hypertension   . Hyperlipidemia   . Obesity   . Anxiety   . Sleep apnea   . Diabetes mellitus   . Dermatophytosis of groin and perianal area   . Anemia   . B12 deficiency   . Osteoporosis   . Depression   . Arthritis   . Low back pain     l5 disc  . Diverticulosis   . TOBACCO ABUSE 10/05/2009    Qualifier: Diagnosis of  By: Stanford Breed, MD, Kandyce Rud   . Adenomatous polyp of colon 02/2004    History   Social History  . Marital Status: Married    Spouse Name: N/A    Number of Children: N/A  . Years of Education: N/A   Occupational History  . retired    Social History Main Topics  . Smoking status: Former Smoker    Quit date: 03/06/2008  . Smokeless tobacco: Never Used  . Alcohol Use: No  . Drug Use: No  . Sexual Activity: Yes   Other Topics Concern  . Not on file   Social History Narrative   Pt states she has regular exercise    Past  Surgical History  Procedure Laterality Date  . Abdominal hysterectomy    . Bariatric surgery    . Coronary angioplasty  2002    2 times  . Appendectomy    . Carpal tunnel release    . Hernia repair    . Tonsillectomy    . Ulnar nerve release    . Total knee arthroplasty    . Nephrectomy    . Lumbar laminectomy      l3-4  . Cholecystectomy    . Tubal ligation      Family History  Problem Relation Age of Onset  . Coronary artery disease    . Colitis Mother     sepsis from c dif colitis  . Heart disease Father   . Stomach cancer Maternal Uncle     Allergies  Allergen Reactions  . Sulfonamide Derivatives Diarrhea, Nausea Only and Other (See Comments)    Also, stomach cramps    Current Outpatient Prescriptions on File Prior to Visit  Medication Sig Dispense Refill  . albuterol (PROVENTIL HFA;VENTOLIN HFA) 108 (90 BASE) MCG/ACT inhaler Inhale 2 puffs into the lungs every 6 (six) hours as needed for wheezing.  1 Inhaler  0  . ALPRAZolam (XANAX) 0.25 MG tablet Take 1 tablet (0.25 mg total) by mouth 3 (three) times daily as needed.  60 tablet  5  . aspirin 81 MG  tablet Take 81 mg by mouth daily.        Marland Kitchen glucose blood test strip 1 each by Other route daily. Use as instructed  100 each  5  . HYDROcodone-acetaminophen (NORCO/VICODIN) 5-325 MG per tablet Take 1 tablet by mouth every 6 (six) hours as needed for moderate pain.  30 tablet  0  . isosorbide mononitrate (IMDUR) 60 MG 24 hr tablet TAKE ONE TABLET BY MOUTH ONE TIME DAILY   30 tablet  10  . metoprolol (LOPRESSOR) 50 MG tablet Take one tablet by mouth twice daily  180 tablet  3  . nitroGLYCERIN (NITROLINGUAL) 0.4 MG/SPRAY spray Place 1 spray under the tongue every 5 (five) minutes as needed.      Marland Kitchen olmesartan-hydrochlorothiazide (BENICAR HCT) 40-25 MG per tablet Take 1 tablet by mouth daily.  30 tablet  11  . omeprazole (PRILOSEC) 40 MG capsule Take 1 capsule (40 mg total) by mouth daily.  30 capsule  11  . rosuvastatin  (CRESTOR) 20 MG tablet Take 20 mg by mouth 3 (three) times a week.         No current facility-administered medications on file prior to visit.    BP 110/80  Pulse 64  Temp(Src) 97.3 F (36.3 C) (Oral)  Ht 5' 2.5" (1.588 m)  Wt 215 lb (97.523 kg)  BMI 38.67 kg/m2       Objective:   Physical Exam  Nursing note and vitals reviewed. Constitutional: She is oriented to person, place, and time. She appears well-developed and well-nourished.  Eyes: Conjunctivae are normal. Pupils are equal, round, and reactive to light.  Neck: Normal range of motion. Neck supple.  Cardiovascular: Normal rate and regular rhythm.   Pulmonary/Chest: Effort normal and breath sounds normal.  Abdominal: Soft. Bowel sounds are normal.  Musculoskeletal: Normal range of motion.  Neurological: She is alert and oriented to person, place, and time.  Skin: Skin is dry.  Psychiatric: She has a normal mood and affect. Her behavior is normal.          Assessment & Plan:  Stable HTN weight issues key Stable COPD but still needs to stop smoking  reveiwed the labs  pneumonia   Subjective:    Tricia [name] is a 68 y.o. female who presents for Medicare Annual/Subsequent preventive examination.  Preventive Screening-Counseling & Management  Tobacco History  Smoking status  . Former Smoker  . Quit date: 03/06/2008  Smokeless tobacco  . Never Used     Problems Prior to Visit 1.   Current Problems (verified) Patient Active Problem List   Diagnosis Date Noted  . ANXIETY 10/03/2009  . SLEEP APNEA 10/03/2009  . ANEMIA, VITAMIN B12 DEFICIENCY 04/04/2009  . LEG CRAMPS 04/04/2009  . OSTEOPOROSIS 12/14/2006  . HYPERLIPIDEMIA 09/22/2006  . DEPRESSION 09/22/2006  . HYPERTENSION 09/22/2006  . CORONARY ARTERY DISEASE 09/22/2006  . OSTEOARTHRITIS 09/22/2006  . BACK PAIN 09/22/2006    Medications Prior to Visit Current Outpatient Prescriptions on File Prior to Visit  Medication Sig Dispense  Refill  . albuterol (PROVENTIL HFA;VENTOLIN HFA) 108 (90 BASE) MCG/ACT inhaler Inhale 2 puffs into the lungs every 6 (six) hours as needed for wheezing.  1 Inhaler  0  . ALPRAZolam (XANAX) 0.25 MG tablet Take 1 tablet (0.25 mg total) by mouth 3 (three) times daily as needed.  60 tablet  5  . aspirin 81 MG tablet Take 81 mg by mouth daily.        Marland Kitchen glucose blood test strip  1 each by Other route daily. Use as instructed  100 each  5  . HYDROcodone-acetaminophen (NORCO/VICODIN) 5-325 MG per tablet Take 1 tablet by mouth every 6 (six) hours as needed for moderate pain.  30 tablet  0  . isosorbide mononitrate (IMDUR) 60 MG 24 hr tablet TAKE ONE TABLET BY MOUTH ONE TIME DAILY   30 tablet  10  . metoprolol (LOPRESSOR) 50 MG tablet Take one tablet by mouth twice daily  180 tablet  3  . nitroGLYCERIN (NITROLINGUAL) 0.4 MG/SPRAY spray Place 1 spray under the tongue every 5 (five) minutes as needed.      Marland Kitchen olmesartan-hydrochlorothiazide (BENICAR HCT) 40-25 MG per tablet Take 1 tablet by mouth daily.  30 tablet  11  . omeprazole (PRILOSEC) 40 MG capsule Take 1 capsule (40 mg total) by mouth daily.  30 capsule  11  . rosuvastatin (CRESTOR) 20 MG tablet Take 20 mg by mouth 3 (three) times a week.         No current facility-administered medications on file prior to visit.    Current Medications (verified) Current Outpatient Prescriptions  Medication Sig Dispense Refill  . albuterol (PROVENTIL HFA;VENTOLIN HFA) 108 (90 BASE) MCG/ACT inhaler Inhale 2 puffs into the lungs every 6 (six) hours as needed for wheezing.  1 Inhaler  0  . ALPRAZolam (XANAX) 0.25 MG tablet Take 1 tablet (0.25 mg total) by mouth 3 (three) times daily as needed.  60 tablet  5  . aspirin 81 MG tablet Take 81 mg by mouth daily.        Marland Kitchen glucose blood test strip 1 each by Other route daily. Use as instructed  100 each  5  . HYDROcodone-acetaminophen (NORCO/VICODIN) 5-325 MG per tablet Take 1 tablet by mouth every 6 (six) hours as needed  for moderate pain.  30 tablet  0  . isosorbide mononitrate (IMDUR) 60 MG 24 hr tablet TAKE ONE TABLET BY MOUTH ONE TIME DAILY   30 tablet  10  . metoprolol (LOPRESSOR) 50 MG tablet Take one tablet by mouth twice daily  180 tablet  3  . nitroGLYCERIN (NITROLINGUAL) 0.4 MG/SPRAY spray Place 1 spray under the tongue every 5 (five) minutes as needed.      Marland Kitchen olmesartan-hydrochlorothiazide (BENICAR HCT) 40-25 MG per tablet Take 1 tablet by mouth daily.  30 tablet  11  . omeprazole (PRILOSEC) 40 MG capsule Take 1 capsule (40 mg total) by mouth daily.  30 capsule  11  . rosuvastatin (CRESTOR) 20 MG tablet Take 20 mg by mouth 3 (three) times a week.         No current facility-administered medications for this visit.     Allergies (verified) Sulfonamide derivatives   PAST HISTORY  Family History Family History  Problem Relation Age of Onset  . Coronary artery disease    . Colitis Mother     sepsis from c dif colitis  . Heart disease Father   . Stomach cancer Maternal Uncle     Social History History  Substance Use Topics  . Smoking status: Former Smoker    Quit date: 03/06/2008  . Smokeless tobacco: Never Used  . Alcohol Use: No     Are there smokers in your home (other than you)? No  Risk Factors Current exercise habits: The patient does not participate in regular exercise at present.   Dietary issues discussed: weigth loss   Cardiac risk factors: dyslipidemia, hypertension, obesity (BMI >= 30 kg/m2), sedentary lifestyle and smoking/ tobacco  exposure.  Depression Screen (Note: if answer to either of the following is "Yes", a more complete depression screening is indicated)   Over the past two weeks, have you felt down, depressed or hopeless? No  Over the past two weeks, have you felt little interest or pleasure in doing things? No  Have you lost interest or pleasure in daily life? No  Do you often feel hopeless? No  Do you cry easily over simple problems? Yes  Activities  of Daily Living In your present state of health, do you have any difficulty performing the following activities?:  Driving? No Managing money?  No Feeding yourself? No Getting from bed to chair? No Climbing a flight of stairs? No Preparing food and eating?: No Bathing or showering? No Getting dressed: No Getting to the toilet? No Using the toilet:No Moving around from place to place: No In the past year have you fallen or had a near fall?:No   Are you sexually active?  No  Do you have more than one partner?  No  Hearing Difficulties: No Do you often ask people to speak up or repeat themselves? No Do you experience ringing or noises in your ears? No Do you have difficulty understanding soft or whispered voices? No   Do you feel that you have a problem with memory? No  Do you often misplace items? No  Do you feel safe at home?  No  Cognitive Testing  Alert? Yes  Normal Appearance?Yes  Oriented to person? Yes  Place? Yes   Time? Yes  Recall of three objects?  Yes  Can perform simple calculations? Yes  Displays appropriate judgment?Yes  Can read the correct time from a watch face?Yes   Advanced Directives have been discussed with the patient? Yes  List the Names of Other Physician/Practitioners you currently use: 1.    Indicate any recent Medical Services you may have received from other than [facility] providers in the past year (date may be approximate).  Immunization History  Administered Date(s) Administered  . Influenza Split 01/21/2011  . Influenza Whole 11/19/2007  . Pneumococcal Conjugate-13 08/26/2013  . Pneumococcal Polysaccharide-23 02/04/2003  . Td 02/03/2005    Screening Tests Health Maintenance  Topic Date Due  . Foot Exam  09/02/1957  . Ophthalmology Exam  09/02/1957  . Zostavax  09/03/2007  . Mammogram  05/12/2012  . Pneumococcal Polysaccharide Vaccine Age 23 And Over  09/02/2012  . Influenza Vaccine  09/03/2013  . Hemoglobin A1c  02/19/2014  .  Urine Microalbumin  08/20/2014  . Tetanus/tdap  02/04/2015  . Colonoscopy  10/12/2017    All answers were reviewed with the patient and necessary referrals were made:  Georgetta Haber, MD   08/26/2013   History reviewed: allergies, current medications, past family history, past medical history, past social history, past surgical history and problem list  Review of Systems Pertinent items are noted in HPI.    Objective:     Vision by Snellen chart: right eye:20/20, left eye:20/20  Body mass index is 38.67 kg/(m^2). BP 110/80  Pulse 64  Temp(Src) 97.3 F (36.3 C) (Oral)  Ht 5' 2.5" (1.588 m)  Wt 215 lb (97.523 kg)  BMI 38.67 kg/m2       Assessment:      This is a routine wellness  examination for this patient . I reviewed all health maintenance protocols including mammography, colonoscopy, bone density Needed referrals were placed. Age and diagnosis  appropriate screening labs were ordered. Her immunization history was  reviewed and appropriate vaccinations were ordered. Her current medications and allergies were reviewed and needed refills of her chronic medications were ordered. The plan for yearly health maintenance was discussed all orders and referrals were made as appropriate.      Plan:     During the course of the visit the patient was educated and counseled about appropriate screening and preventive services including:    Pneumococcal vaccine   Influenza vaccine  Td vaccine  Diet review for nutrition referral? Yes ____  Not Indicated ____   Patient Instructions (the written plan) was given to the patient.  Medicare Attestation I have personally reviewed: The patient's medical and social history Their use of alcohol, tobacco or illicit drugs Their current medications and supplements The patient's functional ability including ADLs,fall risks, home safety risks, cognitive, and hearing and visual impairment Diet and physical activities Evidence for  depression or mood disorders  The patient's weight, height, BMI, and visual acuity have been recorded in the chart.  I have made referrals, counseling, and provided education to the patient based on review of the above and I have provided the patient with a written personalized care plan for preventive services.     Georgetta Haber, MD   08/26/2013

## 2013-08-26 NOTE — Addendum Note (Signed)
Addended by: Westley Hummer B on: 08/26/2013 04:16 PM   Modules accepted: Orders

## 2013-08-26 NOTE — Progress Notes (Signed)
Pre visit review using our clinic review tool, if applicable. No additional management support is needed unless otherwise documented below in the visit note. 

## 2013-09-07 ENCOUNTER — Other Ambulatory Visit: Payer: Self-pay | Admitting: Gastroenterology

## 2013-09-07 NOTE — Telephone Encounter (Signed)
Needs appt for any further refills

## 2013-09-14 ENCOUNTER — Other Ambulatory Visit: Payer: Self-pay | Admitting: Gastroenterology

## 2013-09-15 ENCOUNTER — Encounter (HOSPITAL_BASED_OUTPATIENT_CLINIC_OR_DEPARTMENT_OTHER): Payer: Self-pay | Admitting: Emergency Medicine

## 2013-09-15 ENCOUNTER — Telehealth: Payer: Self-pay | Admitting: Internal Medicine

## 2013-09-15 ENCOUNTER — Emergency Department (HOSPITAL_BASED_OUTPATIENT_CLINIC_OR_DEPARTMENT_OTHER)
Admission: EM | Admit: 2013-09-15 | Discharge: 2013-09-15 | Disposition: A | Discharge status: Home / Self Care | Payer: Medicare Other | Attending: Emergency Medicine | Admitting: Emergency Medicine

## 2013-09-15 DIAGNOSIS — N390 Urinary tract infection, site not specified: Secondary | ICD-10-CM | POA: Diagnosis not present

## 2013-09-15 DIAGNOSIS — F329 Major depressive disorder, single episode, unspecified: Secondary | ICD-10-CM | POA: Insufficient documentation

## 2013-09-15 DIAGNOSIS — M129 Arthropathy, unspecified: Secondary | ICD-10-CM | POA: Insufficient documentation

## 2013-09-15 DIAGNOSIS — E119 Type 2 diabetes mellitus without complications: Secondary | ICD-10-CM | POA: Diagnosis not present

## 2013-09-15 DIAGNOSIS — Z8719 Personal history of other diseases of the digestive system: Secondary | ICD-10-CM | POA: Insufficient documentation

## 2013-09-15 DIAGNOSIS — Z872 Personal history of diseases of the skin and subcutaneous tissue: Secondary | ICD-10-CM | POA: Insufficient documentation

## 2013-09-15 DIAGNOSIS — F3289 Other specified depressive episodes: Secondary | ICD-10-CM | POA: Diagnosis not present

## 2013-09-15 DIAGNOSIS — F411 Generalized anxiety disorder: Secondary | ICD-10-CM | POA: Diagnosis not present

## 2013-09-15 DIAGNOSIS — Z9861 Coronary angioplasty status: Secondary | ICD-10-CM | POA: Diagnosis not present

## 2013-09-15 DIAGNOSIS — I2510 Atherosclerotic heart disease of native coronary artery without angina pectoris: Secondary | ICD-10-CM | POA: Insufficient documentation

## 2013-09-15 DIAGNOSIS — E663 Overweight: Secondary | ICD-10-CM | POA: Diagnosis not present

## 2013-09-15 DIAGNOSIS — R197 Diarrhea, unspecified: Secondary | ICD-10-CM | POA: Insufficient documentation

## 2013-09-15 DIAGNOSIS — Z79899 Other long term (current) drug therapy: Secondary | ICD-10-CM | POA: Insufficient documentation

## 2013-09-15 DIAGNOSIS — Z860100 Personal history of colon polyps, unspecified: Secondary | ICD-10-CM | POA: Insufficient documentation

## 2013-09-15 DIAGNOSIS — Z862 Personal history of diseases of the blood and blood-forming organs and certain disorders involving the immune mechanism: Secondary | ICD-10-CM | POA: Diagnosis not present

## 2013-09-15 DIAGNOSIS — Z87891 Personal history of nicotine dependence: Secondary | ICD-10-CM | POA: Insufficient documentation

## 2013-09-15 DIAGNOSIS — I10 Essential (primary) hypertension: Secondary | ICD-10-CM | POA: Diagnosis not present

## 2013-09-15 DIAGNOSIS — Z7982 Long term (current) use of aspirin: Secondary | ICD-10-CM | POA: Insufficient documentation

## 2013-09-15 LAB — CBC WITH DIFFERENTIAL/PLATELET
Basophils Absolute: 0 10*3/uL (ref 0.0–0.1)
Basophils Relative: 0 % (ref 0–1)
Eosinophils Absolute: 0.1 10*3/uL (ref 0.0–0.7)
Eosinophils Relative: 1 % (ref 0–5)
HCT: 46.9 % — ABNORMAL HIGH (ref 36.0–46.0)
Hemoglobin: 16.3 g/dL — ABNORMAL HIGH (ref 12.0–15.0)
Lymphocytes Relative: 28 % (ref 12–46)
Lymphs Abs: 2.2 10*3/uL (ref 0.7–4.0)
MCH: 29.5 pg (ref 26.0–34.0)
MCHC: 34.8 g/dL (ref 30.0–36.0)
MCV: 84.8 fL (ref 78.0–100.0)
Monocytes Absolute: 0.5 10*3/uL (ref 0.1–1.0)
Monocytes Relative: 7 % (ref 3–12)
Neutro Abs: 5.1 10*3/uL (ref 1.7–7.7)
Neutrophils Relative %: 64 % (ref 43–77)
Platelets: 203 10*3/uL (ref 150–400)
RBC: 5.53 MIL/uL — ABNORMAL HIGH (ref 3.87–5.11)
RDW: 12.5 % (ref 11.5–15.5)
WBC: 7.9 10*3/uL (ref 4.0–10.5)

## 2013-09-15 LAB — COMPREHENSIVE METABOLIC PANEL
ALT: 8 U/L (ref 0–35)
AST: 13 U/L (ref 0–37)
Albumin: 3.8 g/dL (ref 3.5–5.2)
Alkaline Phosphatase: 177 U/L — ABNORMAL HIGH (ref 39–117)
Anion gap: 14 (ref 5–15)
BUN: 17 mg/dL (ref 6–23)
CO2: 27 mEq/L (ref 19–32)
Calcium: 10.1 mg/dL (ref 8.4–10.5)
Chloride: 97 mEq/L (ref 96–112)
Creatinine, Ser: 0.9 mg/dL (ref 0.50–1.10)
GFR calc Af Amer: 76 mL/min — ABNORMAL LOW (ref >90)
GFR calc non Af Amer: 65 mL/min — ABNORMAL LOW (ref >90)
Glucose, Bld: 103 mg/dL — ABNORMAL HIGH (ref 70–99)
Potassium: 3.1 mEq/L — ABNORMAL LOW (ref 3.7–5.3)
Sodium: 138 mEq/L (ref 137–147)
Total Bilirubin: 0.4 mg/dL (ref 0.3–1.2)
Total Protein: 7.5 g/dL (ref 6.0–8.3)

## 2013-09-15 LAB — URINALYSIS, ROUTINE W REFLEX MICROSCOPIC
Bilirubin Urine: NEGATIVE
Glucose, UA: NEGATIVE mg/dL
Ketones, ur: NEGATIVE mg/dL
Nitrite: POSITIVE — AB
Protein, ur: NEGATIVE mg/dL
Specific Gravity, Urine: 1.018 (ref 1.005–1.030)
Urobilinogen, UA: 0.2 mg/dL (ref 0.0–1.0)
pH: 5 (ref 5.0–8.0)

## 2013-09-15 LAB — URINE MICROSCOPIC-ADD ON

## 2013-09-15 LAB — I-STAT CG4 LACTIC ACID, ED: Lactic Acid, Venous: 1.11 mmol/L (ref 0.5–2.2)

## 2013-09-15 MED ORDER — SODIUM CHLORIDE 0.9 % IV BOLUS (SEPSIS)
1000.0000 mL | Freq: Once | INTRAVENOUS | Status: AC
  Administered 2013-09-15: 1000 mL via INTRAVENOUS

## 2013-09-15 MED ORDER — POTASSIUM CHLORIDE 20 MEQ/15ML (10%) PO LIQD
40.0000 meq | Freq: Once | ORAL | Status: AC
  Administered 2013-09-15: 40 meq via ORAL
  Filled 2013-09-15: qty 30

## 2013-09-15 MED ORDER — METRONIDAZOLE 500 MG PO TABS: 500.0000 mg | ORAL_TABLET | Freq: Three times a day (TID) | ORAL | Status: DC

## 2013-09-15 MED ORDER — CIPROFLOXACIN HCL 500 MG PO TABS: 500.0000 mg | ORAL_TABLET | Freq: Two times a day (BID) | ORAL | Status: DC

## 2013-09-15 MED ORDER — LOPERAMIDE HCL 2 MG PO CAPS: 2.0000 mg | ORAL_CAPSULE | Freq: Four times a day (QID) | ORAL | Status: DC | PRN

## 2013-09-15 NOTE — ED Notes (Signed)
EDP at patient bedside for assessment/update  

## 2013-09-15 NOTE — Discharge Instructions (Signed)
Diarrhea Your diarrhea may be related to c. Difficile.  Cultures are pending and we will treat you.    Diarrhea is frequent loose and watery bowel movements. It can cause you to feel weak and dehydrated. Dehydration can cause you to become tired and thirsty, have a dry mouth, and have decreased urination that often is dark yellow. Diarrhea is a sign of another problem, most often an infection that will not last long. In most cases, diarrhea typically lasts 2-3 days. However, it can last longer if it is a sign of something more serious. It is important to treat your diarrhea as directed by your caregiver to lessen or prevent future episodes of diarrhea. CAUSES  Some common causes include:  Gastrointestinal infections caused by viruses, bacteria, or parasites.  Food poisoning or food allergies.  Certain medicines, such as antibiotics, chemotherapy, and laxatives.  Artificial sweeteners and fructose.  Digestive disorders. HOME CARE INSTRUCTIONS  Ensure adequate fluid intake (hydration): Have 1 cup (8 oz) of fluid for each diarrhea episode. Avoid fluids that contain simple sugars or sports drinks, fruit juices, whole milk products, and sodas. Your urine should be clear or pale yellow if you are drinking enough fluids. Hydrate with an oral rehydration solution that you can purchase at pharmacies, retail stores, and online. You can prepare an oral rehydration solution at home by mixing the following ingredients together:   - tsp table salt.   tsp baking soda.   tsp salt substitute containing potassium chloride.  1  tablespoons sugar.  1 L (34 oz) of water.  Certain foods and beverages may increase the speed at which food moves through the gastrointestinal (GI) tract. These foods and beverages should be avoided and include:  Caffeinated and alcoholic beverages.  High-fiber foods, such as raw fruits and vegetables, nuts, seeds, and whole grain breads and cereals.  Foods and beverages  sweetened with sugar alcohols, such as xylitol, sorbitol, and mannitol.  Some foods may be well tolerated and may help thicken stool including:  Starchy foods, such as rice, toast, pasta, low-sugar cereal, oatmeal, grits, baked potatoes, crackers, and bagels.  Bananas.  Applesauce.  Add probiotic-rich foods to help increase healthy bacteria in the GI tract, such as yogurt and fermented milk products.  Wash your hands well after each diarrhea episode.  Only take over-the-counter or prescription medicines as directed by your caregiver.  Take a warm bath to relieve any burning or pain from frequent diarrhea episodes. SEEK IMMEDIATE MEDICAL CARE IF:   You are unable to keep fluids down.  You have persistent vomiting.  You have blood in your stool, or your stools are black and tarry.  You do not urinate in 6-8 hours, or there is only a small amount of very dark urine.  You have abdominal pain that increases or localizes.  You have weakness, dizziness, confusion, or light-headedness.  You have a severe headache.  Your diarrhea gets worse or does not get better.  You have a fever or persistent symptoms for more than 2-3 days.  You have a fever and your symptoms suddenly get worse. MAKE SURE YOU:   Understand these instructions.  Will watch your condition.  Will get help right away if you are not doing well or get worse. Document Released: 01/10/2002 Document Revised: 06/06/2013 Document Reviewed: 09/28/2011 [facility] Patient Information 2015 [place], [place]. This information is not intended to replace advice given to you by your health care provider. Make sure you discuss any questions you have with your  health care provider. Urinary Tract Infection Urinary tract infections (UTIs) can develop anywhere along your urinary tract. Your urinary tract is your body's drainage system for removing wastes and extra water. Your urinary tract includes two kidneys, two ureters, a bladder,  and a urethra. Your kidneys are a pair of bean-shaped organs. Each kidney is about the size of your fist. They are located below your ribs, one on each side of your spine. CAUSES Infections are caused by microbes, which are microscopic organisms, including fungi, viruses, and bacteria. These organisms are so small that they can only be seen through a microscope. Bacteria are the microbes that most commonly cause UTIs. SYMPTOMS  Symptoms of UTIs may vary by age and gender of the patient and by the location of the infection. Symptoms in young women typically include a frequent and intense urge to urinate and a painful, burning feeling in the bladder or urethra during urination. Older women and men are more likely to be tired, shaky, and weak and have muscle aches and abdominal pain. A fever may mean the infection is in your kidneys. Other symptoms of a kidney infection include pain in your back or sides below the ribs, nausea, and vomiting. DIAGNOSIS To diagnose a UTI, your caregiver will ask you about your symptoms. Your caregiver also will ask to provide a urine sample. The urine sample will be tested for bacteria and white blood cells. White blood cells are made by your body to help fight infection. TREATMENT  Typically, UTIs can be treated with medication. Because most UTIs are caused by a bacterial infection, they usually can be treated with the use of antibiotics. The choice of antibiotic and length of treatment depend on your symptoms and the type of bacteria causing your infection. HOME CARE INSTRUCTIONS  If you were prescribed antibiotics, take them exactly as your caregiver instructs you. Finish the medication even if you feel better after you have only taken some of the medication.  Drink enough water and fluids to keep your urine clear or pale yellow.  Avoid caffeine, tea, and carbonated beverages. They tend to irritate your bladder.  Empty your bladder often. Avoid holding urine for long  periods of time.  Empty your bladder before and after sexual intercourse.  After a bowel movement, women should cleanse from front to back. Use each tissue only once. SEEK MEDICAL CARE IF:   You have back pain.  You develop a fever.  Your symptoms do not begin to resolve within 3 days. SEEK IMMEDIATE MEDICAL CARE IF:   You have severe back pain or lower abdominal pain.  You develop chills.  You have nausea or vomiting.  You have continued burning or discomfort with urination. MAKE SURE YOU:   Understand these instructions.  Will watch your condition.  Will get help right away if you are not doing well or get worse. Document Released: 10/30/2004 Document Revised: 07/22/2011 Document Reviewed: 02/28/2011 [facility] Patient Information 2015 [place], [place]. This information is not intended to replace advice given to you by your health care provider. Make sure you discuss any questions you have with your health care provider.

## 2013-09-15 NOTE — Telephone Encounter (Signed)
Noted  

## 2013-09-15 NOTE — ED Notes (Signed)
Reports PMD advised to come to ED for eval. Sts 13 episodes of diarrhea since yesterday morning. Denies abdominal pain, reports some cramps. Denies sick contacts

## 2013-09-15 NOTE — Telephone Encounter (Signed)
NEEDS APPT FOR ANY FURTHER REFILLS! 

## 2013-09-15 NOTE — ED Notes (Signed)
Per. Dr. Dina Rich, the pt received a specimen cup and bag. Pt was given instructions on collection of stool sample and return to our lab. She will need to f/u with her own physician for results

## 2013-09-15 NOTE — ED Notes (Signed)
Reports recently finishing abx on Monday for Macrobid for UTI.

## 2013-09-15 NOTE — ED Provider Notes (Signed)
CSN: [id number]     Arrival date & time 09/15/13  1332 History   First MD Initiated Contact with Patient 09/15/13 1334     Chief Complaint  Patient presents with  . Diarrhea     (Consider location/radiation/quality/duration/timing/severity/associated sxs/prior Treatment) HPI  This is a 68 yo female with history of diabetes, coronary artery disease, hypertension, hyperlipidemia who presents with diarrhea. Patient reports over 24 hours of watery diarrhea. She reports that she's had greater than 13 episodes of watery diarrhea. She denies any blood in her stool. She denies any nausea or vomiting. She does endorse abdominal cramping right before she stools. She denies any recent travel. She did just recently finished a course of Macrobid for a urinary tract infection. Patient reports decreased urination over the last 24 hours. She's been able to tolerate by mouth he reports that she's been trying to drink "as much as possible." She denies any dizziness, chest pain, shortness of breath.  Past Medical History  Diagnosis Date  . CAD (coronary artery disease)   . Hypertension   . Hyperlipidemia   . Obesity   . Anxiety   . Sleep apnea   . Diabetes mellitus   . Dermatophytosis of groin and perianal area   . Anemia   . B12 deficiency   . Osteoporosis   . Depression   . Arthritis   . Low back pain     l5 disc  . Diverticulosis   . TOBACCO ABUSE 10/05/2009    Qualifier: Diagnosis of  By: Stanford Breed, MD, Kandyce Rud   . Adenomatous polyp of colon 02/2004   Past Surgical History  Procedure Laterality Date  . Abdominal hysterectomy    . Bariatric surgery    . Coronary angioplasty  2002    2 times  . Appendectomy    . Carpal tunnel release    . Hernia repair    . Tonsillectomy    . Ulnar nerve release    . Total knee arthroplasty    . Nephrectomy    . Lumbar laminectomy      l3-4  . Cholecystectomy    . Tubal ligation     Family History  Problem Relation Age of Onset  .  Coronary artery disease    . Colitis Mother     sepsis from c dif colitis  . Heart disease Father   . Stomach cancer Maternal Uncle    History  Substance Use Topics  . Smoking status: Former Smoker    Quit date: 03/06/2008  . Smokeless tobacco: Never Used  . Alcohol Use: No   OB History   Grav Para Term Preterm Abortions TAB SAB Ect Mult Living                 Review of Systems  Constitutional: Negative for fever.  Respiratory: Negative for chest tightness and shortness of breath.   Cardiovascular: Negative for chest pain.  Gastrointestinal: Positive for abdominal pain and diarrhea. Negative for nausea, vomiting and blood in stool.  Genitourinary: Positive for decreased urine volume. Negative for dysuria.  Neurological: Negative for headaches.  All other systems reviewed and are negative.     Allergies  Sulfonamide derivatives  Home Medications   Prior to Admission medications   Medication Sig Start Date End Date Taking? Authorizing Provider  albuterol (PROVENTIL HFA;VENTOLIN HFA) 108 (90 BASE) MCG/ACT inhaler Inhale 2 puffs into the lungs every 6 (six) hours as needed for wheezing. 07/30/12   Lucretia Kern, DO  ALPRAZolam (XANAX) 0.25 MG tablet Take 1 tablet (0.25 mg total) by mouth 3 (three) times daily as needed. 08/26/13   Ricard Dillon, MD  aspirin 81 MG tablet Take 81 mg by mouth daily.      Historical Provider, MD  ciprofloxacin (CIPRO) 500 MG tablet Take 1 tablet (500 mg total) by mouth every 12 (twelve) hours. 09/15/13   Merryl Hacker, MD  glucose blood test strip 1 each by Other route daily. Use as instructed 08/12/13   Ricard Dillon, MD  HYDROcodone-acetaminophen (NORCO/VICODIN) 5-325 MG per tablet Take 1 tablet by mouth every 6 (six) hours as needed for moderate pain. 08/26/13   Ricard Dillon, MD  isosorbide mononitrate (IMDUR) 60 MG 24 hr tablet TAKE ONE TABLET BY MOUTH ONE TIME DAILY  07/22/13   Ricard Dillon, MD  loperamide (IMODIUM) 2 MG capsule Take 1  capsule (2 mg total) by mouth 4 (four) times daily as needed for diarrhea or loose stools. 09/15/13   Merryl Hacker, MD  metoprolol (LOPRESSOR) 50 MG tablet Take one tablet by mouth twice daily 04/14/13   Ricard Dillon, MD  metroNIDAZOLE (FLAGYL) 500 MG tablet Take 1 tablet (500 mg total) by mouth 3 (three) times daily. 09/15/13   Merryl Hacker, MD  nitrofurantoin, macrocrystal-monohydrate, (MACROBID) 100 MG capsule Take 1 capsule (100 mg total) by mouth 2 (two) times daily. 08/26/13   Ricard Dillon, MD  nitroGLYCERIN (NITROLINGUAL) 0.4 MG/SPRAY spray Place 1 spray under the tongue every 5 (five) minutes as needed. 08/26/13   Ricard Dillon, MD  olmesartan-hydrochlorothiazide (BENICAR HCT) 40-25 MG per tablet Take 1 tablet by mouth daily. 02/18/13   Ricard Dillon, MD  omeprazole (PRILOSEC) 40 MG capsule TAKE ONE CAPSULE BY MOUTH ONE TIME DAILY  09/07/13   Ladene Artist, MD  omeprazole (PRILOSEC) 40 MG capsule TAKE ONE CAPSULE BY MOUTH ONE TIME DAILY  09/15/13   Ladene Artist, MD  pravastatin (PRAVACHOL) 40 MG tablet Take 1 tablet (40 mg total) by mouth daily. 08/26/13   Ricard Dillon, MD   BP 174/89  Pulse 87  Temp(Src) 97.5 F (36.4 C) (Oral)  Resp 18  Ht 5\' 3"  (1.6 m)  Wt 210 lb (95.255 kg)  BMI 37.21 kg/m2  SpO2 100% Physical Exam  Nursing note and vitals reviewed. Constitutional: She is oriented to person, place, and time. She appears well-developed and well-nourished.  overweight  HENT:  Head: Normocephalic and atraumatic.  MM dry  Eyes: Pupils are equal, round, and reactive to light.  Cardiovascular: Normal rate, regular rhythm and normal heart sounds.   No murmur heard. Pulmonary/Chest: Effort normal and breath sounds normal. No respiratory distress. She has no wheezes.  Abdominal: Soft. Bowel sounds are normal. There is no tenderness. There is no rebound and no guarding.  Neurological: She is alert and oriented to person, place, and time.  Skin: Skin is warm and dry.   Psychiatric: She has a normal mood and affect.    ED Course  Procedures (including critical care time) Labs Review Labs Reviewed  CBC WITH DIFFERENTIAL - Abnormal; Notable for the following:    RBC 5.53 (*)    Hemoglobin 16.3 (*)    HCT 46.9 (*)    All other components within normal limits  COMPREHENSIVE METABOLIC PANEL - Abnormal; Notable for the following:    Potassium 3.1 (*)    Glucose, Bld 103 (*)    Alkaline Phosphatase 177 (*)  GFR calc non Af Amer 65 (*)    GFR calc Af Amer 76 (*)    All other components within normal limits  URINALYSIS, ROUTINE W REFLEX MICROSCOPIC - Abnormal; Notable for the following:    APPearance CLOUDY (*)    Hgb urine dipstick SMALL (*)    Nitrite POSITIVE (*)    Leukocytes, UA SMALL (*)    All other components within normal limits  URINE MICROSCOPIC-ADD ON - Abnormal; Notable for the following:    Bacteria, UA MANY (*)    All other components within normal limits  URINE CULTURE  CLOSTRIDIUM DIFFICILE BY PCR  I-STAT CG4 LACTIC ACID, ED    Imaging Review No results found.   EKG Interpretation None      MDM   Final diagnoses:  Diarrhea  UTI (lower urinary tract infection)    Patient presents with watery diarrhea for the last 24 hours. Notable for blood pressure 180/86. This improved to 140/78 while at the ED. She is nontoxic on exam. No evidence of peritonitis.  The patient appears mildly dehydrated. Given recent antibiotic use, C. difficile his consideration. Basic labwork was obtained. Blood work is notable for hypokalemia with potassium of 3.1. This was replenished. Patient also has elevation in alkaline phosphatase which was noted on prior CMP. Otherwise urinalysis is nitrite positive with many bacteria. Urine culture sent. Patient just finished a course of Macrobid. I reviewed her prior culture results. Macrobid should have been appropriate treatment based on urine culture.  Given diarrhea, would favor placing patient on empiric  Cipro and Flagyl for C. difficile. Prior urine culture was intermediate for ciprofloxacin; however, will place on a longer duration hoping for it to cover her UTI as well.  Patient has been unable to give [place] a stool sample. She was instructed to collect a stool sample at home and return.  Patient was able to by mouth challenge and hydrate prior to discharge. Will followup with primary care physician.  After history, exam, and medical workup I feel the patient has been appropriately medically screened and is safe for discharge home. Pertinent diagnoses were discussed with the patient. Patient was given return precautions.     Merryl Hacker, MD 09/15/13 [phone]

## 2013-09-15 NOTE — Telephone Encounter (Signed)
Patient Information:  Caller Name: Tricia [name]  Phone: [phone]  Patient: Tricia [name]  Gender: Female  DOB: 03-21-1945  Age: 68 Years  PCP: Garret Reddish  Office Follow Up:  Does the office need to follow up with this patient?: No  Instructions For The Office: N/A  RN Note:  Spoke with [name] regarding patient symptoms and disposition. She advised to send the patient to the ER at this time. Patient verbalized understanding.  Symptoms  Reason For Call & Symptoms: Watery stools. Approximately 13 episodes of diarrhea in the last 24 hours. Also reports mucus in the stool. Patient does not remember the last time she passed urine.  Reviewed Health History In EMR: Yes  Reviewed Medications In EMR: Yes  Reviewed Allergies In EMR: Yes  Reviewed Surgeries / Procedures: Yes  Date of Onset of Symptoms: 09/14/2013  Guideline(s) Used:  Diarrhea  Disposition Per Guideline:   Go to ED Now (or to Office with PCP Approval)  Reason For Disposition Reached:   Drinking very little and has signs of dehydration (e.g., no urine > 12 hours, very dry mouth, very lightheaded)  Advice Given:  N/A  Patient Will Follow Care Advice:  YES

## 2013-09-16 LAB — CLOSTRIDIUM DIFFICILE BY PCR: Toxigenic C. Difficile by PCR: NEGATIVE

## 2013-09-18 LAB — URINE CULTURE: Colony Count: >=100000

## 2013-09-19 ENCOUNTER — Telehealth (HOSPITAL_BASED_OUTPATIENT_CLINIC_OR_DEPARTMENT_OTHER): Payer: Self-pay | Admitting: Emergency Medicine

## 2013-09-19 NOTE — Telephone Encounter (Signed)
Post ED Visit - Positive Culture Follow-up  Culture report reviewed by antimicrobial stewardship pharmacist: []  Wes Sherwood, Pharm.D., BCPS []  Heide Guile, Pharm.D., BCPS []  Alycia Rossetti, Pharm.D., BCPS []  [place], Pharm.D., BCPS, AAHIVP []  Legrand Como, Pharm.D., BCPS, AAHIVP [x]  Hassie Bruce, Pharm.D. []  Milus Glazier, [place].D.  Positive urine culture >100,000 colonie/ml E. Coli Treated with Ciprofloxacin HCL 500mg  po tabs, take 1 tablet by mouth every 12 hours, x 7 days, organism sensitive to the same and no further patient follow-up is required at this time.  Hazle Nordmann 09/19/2013, 1:56 PM

## 2013-09-28 ENCOUNTER — Telehealth: Payer: Self-pay | Admitting: Internal Medicine

## 2013-09-28 NOTE — Telephone Encounter (Signed)
Pt needs refill on omeprazole and will call dr stark office

## 2013-10-18 ENCOUNTER — Encounter: Payer: Self-pay | Admitting: Internal Medicine

## 2013-11-09 ENCOUNTER — Other Ambulatory Visit: Payer: Self-pay | Admitting: Gastroenterology

## 2014-01-08 ENCOUNTER — Other Ambulatory Visit: Payer: Self-pay | Admitting: Family Medicine

## 2014-01-09 ENCOUNTER — Telehealth: Payer: Self-pay | Admitting: *Deleted

## 2014-01-09 NOTE — Telephone Encounter (Signed)
Ok to refill # 1 if no new symptoms. If sick need OV - set up to see Dr. Yong Channel to establish care so further refill requests should go to him. Thanks.

## 2014-01-09 NOTE — Telephone Encounter (Signed)
Last blood pressure was 174/89. There are generic's in that class of medications that we could try such as valsartan. i would recommend she come in for a visit (can be a 15 minute visit) to check blood pressure and discuss changing.

## 2014-01-09 NOTE — Telephone Encounter (Signed)
I'll forward to Dr. Yong Channel.

## 2014-01-09 NOTE — Telephone Encounter (Signed)
I called the pt and informed her Dr Maudie Mercury approved the refill on her inhaler.  She also wanted to know if she could be given a generic for Benicar?  If so please send this to her pharmacy.

## 2014-01-10 NOTE — Telephone Encounter (Signed)
Tricia [name] Mood please see if pt will come in for a 15 min visit for BP.

## 2014-01-10 NOTE — Telephone Encounter (Signed)
You sent this back to me.

## 2014-01-11 NOTE — Telephone Encounter (Signed)
noted 

## 2014-01-11 NOTE — Telephone Encounter (Signed)
Pt said she has an appt in January she will wait till then

## 2014-02-14 ENCOUNTER — Ambulatory Visit (INDEPENDENT_AMBULATORY_CARE_PROVIDER_SITE_OTHER): Payer: PPO | Admitting: Family Medicine

## 2014-02-14 ENCOUNTER — Encounter: Payer: Self-pay | Admitting: Family Medicine

## 2014-02-14 VITALS — BP 120/70 | Temp 97.8°F | Wt 220.0 lb

## 2014-02-14 DIAGNOSIS — Z9884 Bariatric surgery status: Secondary | ICD-10-CM | POA: Insufficient documentation

## 2014-02-14 DIAGNOSIS — E1169 Type 2 diabetes mellitus with other specified complication: Secondary | ICD-10-CM | POA: Insufficient documentation

## 2014-02-14 DIAGNOSIS — R3589 Other polyuria: Secondary | ICD-10-CM

## 2014-02-14 DIAGNOSIS — K219 Gastro-esophageal reflux disease without esophagitis: Secondary | ICD-10-CM | POA: Insufficient documentation

## 2014-02-14 DIAGNOSIS — I10 Essential (primary) hypertension: Secondary | ICD-10-CM

## 2014-02-14 DIAGNOSIS — Z87891 Personal history of nicotine dependence: Secondary | ICD-10-CM | POA: Insufficient documentation

## 2014-02-14 DIAGNOSIS — M5442 Lumbago with sciatica, left side: Secondary | ICD-10-CM

## 2014-02-14 DIAGNOSIS — E119 Type 2 diabetes mellitus without complications: Secondary | ICD-10-CM

## 2014-02-14 DIAGNOSIS — R358 Other polyuria: Secondary | ICD-10-CM

## 2014-02-14 DIAGNOSIS — I25119 Atherosclerotic heart disease of native coronary artery with unspecified angina pectoris: Secondary | ICD-10-CM

## 2014-02-14 MED ORDER — VALSARTAN 320 MG PO TABS: 320.0000 mg | ORAL_TABLET | Freq: Every day | ORAL | Status: DC

## 2014-02-14 MED ORDER — HYDROCODONE-ACETAMINOPHEN 5-325 MG PO TABS: 1.0000 | ORAL_TABLET | Freq: Two times a day (BID) | ORAL | Status: DC | PRN

## 2014-02-14 MED ORDER — HYDROCHLOROTHIAZIDE 25 MG PO TABS: 25.0000 mg | ORAL_TABLET | Freq: Every day | ORAL | Status: DC

## 2014-02-14 NOTE — Assessment & Plan Note (Signed)
Change from benicar-hctz 40-25 to valsartan 320mg  and hctz 25 mg with 1 month follow up and home BP monitoring in case increased and needs to be seen sooner.

## 2014-02-14 NOTE — Patient Instructions (Addendum)
Benicar-hctz change to valsartan hctz. Check blood pressures at home. Consider 1 month follow up or if Bp elevated.   Refill hydrocodone x 40, follow up prn  Check your urine today -culture it before deciding to treat  Look into if they will pay for shingles and we can treat you  Defer mammogram, ask again in a year

## 2014-02-14 NOTE — Assessment & Plan Note (Signed)
Stable angina requiring nitroglycerin once every 3-4 months. Does not follow with cadiology. Continue ASA, metoprolol, Imdur, pravastatin, nitro spray.

## 2014-02-14 NOTE — Progress Notes (Signed)
Tricia Reddish, MD Phone: [phone]  Subjective:  Patient presents today to establish care with me as their new primary care provider. Patient was formerly a patient of Dr. Arnoldo Morale. Chief complaint-noted.   Hypertension-controlled but  benicar-hctz too expensive.  BP Readings from Last 3 Encounters:  02/14/14 120/70  09/15/13 174/89  08/26/13 110/80  Home BP monitoring-has cuff, when checks under 130 over 80 Compliant with medications-yes without side effects CAD with hx stents- stable angina aproximately once ever 3-4 months has some central chest pain with exertion resolved by nitro. Compliant with imdur. Does not follow with cardiology.  ROS-Denies any HA, SOB, blurry vision,  Chronic Pain from-degenerative disc disease, spondylolisthesis refuses surgery.  Vicodin sparing use for if she exerts herself. Most she has ever taken is BID. Sciatica into left leg.   Urinating small amount, no pain. Slight increased frequency peeing. Send for culture. Nitrofurantoin.  ROS-no fevers/chills/nausea/vomiting. No worsening low back pain.   The following were reviewed and entered/updated in epic: Past Medical History  Diagnosis Date  . CAD (coronary artery disease)   . Hypertension   . Hyperlipidemia   . Obesity   . Anxiety   . Sleep apnea   . Diabetes mellitus   . Dermatophytosis of groin and perianal area   . Anemia   . B12 deficiency   . Osteoporosis   . Depression   . Arthritis   . Low back pain     l5 disc  . Diverticulosis   . TOBACCO ABUSE 10/05/2009    Qualifier: Diagnosis of  By: Stanford Breed, MD, Kandyce Rud   . Adenomatous polyp of colon 02/2004  . DEPRESSION 09/22/2006    Qualifier: Diagnosis of  By: Jimmye Norman, LPN, [place]infield Cunas   . Sleep apnea 10/03/2009    Resolved after gastric bypass     Patient Active Problem List   Diagnosis Date Noted  . CAD (coronary artery disease) 09/22/2006    Priority: High  . 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 Former smoker 02/14/2014    Priority: Medium  . Anxiety state 10/03/2009    Priority: Medium  . ANEMIA, VITAMIN B12 DEFICIENCY 04/04/2009    Priority: Medium  . Hyperlipidemia 09/22/2006    Priority: Medium  . Essential hypertension 09/22/2006    Priority: Medium  . Low back pain 09/22/2006    Priority: Medium  . GERD (gastroesophageal reflux disease) 02/14/2014    Priority: Low  . Osteopenia 12/14/2006    Priority: Low  . Osteoarthritis 09/22/2006    Priority: Low   Past Surgical History  Procedure Laterality Date  . Abdominal hysterectomy    . Bariatric surgery    . Coronary angioplasty  2002    2 times  . Appendectomy    . Carpal tunnel release      bilateral  . Hernia repair    . Tonsillectomy    . Ulnar nerve release      and thumb surgery  . Total knee arthroplasty      L  . Nephrectomy      partial  . Lumbar laminectomy      l3-4  . Cholecystectomy    . Tubal ligation    . Cervical disc surgery      Family History  Problem Relation Age of Onset  . Coronary artery disease    . Colitis Mother     sepsis from c dif colitis  . Heart disease Father   . Stomach  cancer Maternal Uncle     Medications- reviewed and updated Current Outpatient Prescriptions  Medication Sig Dispense Refill  . aspirin 81 MG tablet Take 81 mg by mouth daily.      Marland Kitchen glucose blood test strip 1 each by Other route daily. Use as instructed 100 each 5  . isosorbide mononitrate (IMDUR) 60 MG 24 hr tablet TAKE ONE TABLET BY MOUTH ONE TIME DAILY  30 tablet 10  . metoprolol (LOPRESSOR) 50 MG tablet Take one tablet by mouth twice daily 180 tablet 3  . olmesartan-hydrochlorothiazide (BENICAR HCT) 40-25 MG per tablet Take 1 tablet by mouth daily. 30 tablet 11  . omeprazole (PRILOSEC) 40 MG capsule TAKE ONE CAPSULE BY MOUTH ONE TIME DAILY  30 capsule 0  . pravastatin (PRAVACHOL) 40 MG tablet Take 1 tablet (40 mg total) by mouth daily. 90 tablet 3  . VENTOLIN HFA 108 (90 BASE) MCG/ACT  inhaler. On hand for the winter.  INHALE TWO PUFFS BY MOUTH EVERY SIX HOURS as needed for wheezing 18 g 0  . ALPRAZolam (XANAX) 0.25 MG tablet Take 1 tablet (0.25 mg total) by mouth 3 (three) times daily as needed. (Patient not taking: Reported on 02/14/2014) 60 tablet 5  . HYDROcodone-acetaminophen (NORCO/VICODIN) 5-325 MG per tablet Take 1 tablet by mouth every 6 (six) hours as needed for moderate pain. (Patient not taking: Reported on 02/14/2014) 30 tablet 0  . nitroGLYCERIN (NITROLINGUAL) 0.4 MG/SPRAY spray Place 1 spray under the tongue every 5 (five) minutes as needed. (Patient not taking: Reported on 02/14/2014) 12 g 3   Allergies-reviewed and updated Allergies  Allergen Reactions  . Sulfonamide Derivatives Diarrhea, Nausea Only and Other (See Comments)    Also, stomach cramps    History   Social History  . Marital Status: Married    Spouse Name: N/A    Number of Children: N/A  . Years of Education: N/A   Occupational History  . retired    Social History Main Topics  . Smoking status: Former Smoker -- 1.50 packs/day for 35 years    Types: Cigarettes    Quit date: 03/06/2008  . Smokeless tobacco: Never Used  . Alcohol Use: No  . Drug Use: No  . Sexual Activity: Yes   Other Topics Concern  . None   Social History Narrative   Widowed in 04/2012. 2 children. 3 grandkids.    Lives alone. Completely indendent.       Disabled/retired. Disabled-lifted computer paper      Hobbies: spend money-gamble    ROS--See HPI   Objective: BP 120/70 mmHg  Temp(Src) 97.8 F (36.6 C)  Wt 220 lb (99.791 kg) Gen: NAD, resting comfortably CV: RRR no murmurs rubs or gallops Lungs: CTAB no crackles, wheeze, rhonchi Abdomen: soft/nontender/nondistended/normal bowel sounds.  Ext: no edema Skin: warm, dry, no rash  Neuro: grossly normal, moves all extremities, PERRLA   Assessment/Plan:  CAD (coronary artery disease) Stable angina requiring nitroglycerin once every 3-4 months. Does  not follow with cadiology. Continue ASA, metoprolol, Imdur, pravastatin, nitro spray.   Essential hypertension Change from benicar-hctz 40-25 to valsartan 320mg  and hctz 25 mg with 1 month follow up and home BP monitoring in case increased and needs to be seen sooner.   Low back pain Chronic intermittent narcotics with #30 vicodin lasting >4 months mainly after more exertional activity such as shopping. Given infrequent use, i refilled today. Discussed if progressed to daily use or higher doses, would likely need to consider pain management referral.  Slight polyuria and decreased amount of urination- check urine culture today, treat only if positive >100k colonies.   Return precautions advised. 1 month follow up.   Orders Placed This Encounter  Procedures  . Culture, Urine   Meds ordered this encounter  Medications  . valsartan (DIOVAN) 320 MG tablet    Sig: Take 1 tablet (320 mg total) by mouth daily.    Dispense:  30 tablet    Refill:  11  . hydrochlorothiazide (HYDRODIURIL) 25 MG tablet    Sig: Take 1 tablet (25 mg total) by mouth daily.    Dispense:  30 tablet    Refill:  11  . HYDROcodone-acetaminophen (NORCO/VICODIN) 5-325 MG per tablet    Sig: Take 1 tablet by mouth every 12 (twelve) hours as needed for moderate pain.    Dispense:  40 tablet    Refill:  0

## 2014-02-14 NOTE — Assessment & Plan Note (Signed)
Chronic intermittent narcotics with #30 vicodin lasting >4 months mainly after more exertional activity such as shopping. Given infrequent use, i refilled today. Discussed if progressed to daily use or higher doses, would likely need to consider pain management referral.

## 2014-02-16 MED ORDER — CEPHALEXIN 500 MG PO CAPS: 500.0000 mg | ORAL_CAPSULE | Freq: Four times a day (QID) | ORAL | Status: DC

## 2014-02-16 NOTE — Addendum Note (Signed)
Addended by: Marin Olp on: 02/16/2014 07:47 AM   Modules accepted: Orders

## 2014-02-17 LAB — URINE CULTURE: Colony Count: >=100000

## 2014-02-22 ENCOUNTER — Other Ambulatory Visit: Payer: Self-pay

## 2014-02-22 ENCOUNTER — Telehealth: Payer: Self-pay

## 2014-02-22 NOTE — Telephone Encounter (Signed)
No, please deny and call to cancel.   Patient was changed at last visit due to cost.

## 2014-02-22 NOTE — Telephone Encounter (Signed)
Refill request for benicar HCT 40-25mg  #30. Pt hasnt been on this since 02/2013. Is this ok to refill?

## 2014-02-23 ENCOUNTER — Other Ambulatory Visit: Payer: Self-pay

## 2014-02-23 NOTE — Telephone Encounter (Signed)
Medication cancelled 

## 2014-03-14 ENCOUNTER — Encounter: Payer: Self-pay | Admitting: Family Medicine

## 2014-03-14 ENCOUNTER — Ambulatory Visit (INDEPENDENT_AMBULATORY_CARE_PROVIDER_SITE_OTHER): Payer: PPO | Admitting: Family Medicine

## 2014-03-14 DIAGNOSIS — N390 Urinary tract infection, site not specified: Secondary | ICD-10-CM | POA: Insufficient documentation

## 2014-03-14 DIAGNOSIS — I10 Essential (primary) hypertension: Secondary | ICD-10-CM

## 2014-03-14 NOTE — Assessment & Plan Note (Signed)
Patient has had 3 E. Coli cases 08/2013 through 02/2014. Patient does not have improvement in symptoms despite sensitive antibiotics. It is not clear if these are true UTI or asymptomatic bacteruria. Given no worsening from baseline, we discussed not checking urine culture today.

## 2014-03-14 NOTE — Patient Instructions (Addendum)
Blood pressure is reasonable today. No changes. Glad more affordable.  Information below about bacteria in urine  Check on shingles vaccine   Physical in July  Come back if Blood pressure were to go up   Asymptomatic Bacteriuria Asymptomatic bacteriuria is the presence of a large number of bacteria in your urine without the usual symptoms of burning or frequent urination. The following conditions increase the risk of asymptomatic bacteriuria:  Diabetes mellitus.  Advanced age.  Pregnancy in the first trimester.  Kidney stones.  Kidney transplants.  Leaky kidney tube valve in young children (reflux). Treatment for this condition is not needed in most people and can lead to other problems such as too much yeast and growth of resistant bacteria. However, some people, such as pregnant women, do need treatment to prevent kidney infection. Asymptomatic bacteriuria in pregnancy is also associated with fetal growth restriction, premature labor, and newborn death. HOME CARE INSTRUCTIONS Monitor your condition for any changes. The following actions may help to relieve any discomfort you are feeling:  Drink enough water and fluids to keep your urine clear or pale yellow. Go to the bathroom more often to keep your bladder empty.  Keep the area around your vagina and rectum clean. Wipe yourself from front to back after urinating. SEEK IMMEDIATE MEDICAL CARE IF:  You develop signs of an infection such as:  Burning with urination.  Increased Frequency of voiding.  Back pain.  Fever.  You have blood in the urine.  You develop a fever. MAKE SURE YOU:  Understand these instructions.  Will watch your condition.  Will get help right away if you are not doing well or get worse. Document Released: 01/20/2005 Document Revised: 06/06/2013 Document Reviewed: 07/12/2012 [facility] Patient Information 2015 [place], [place]. This information is not intended to replace advice given to you by  your health care provider. Make sure you discuss any questions you have with your health care provider.

## 2014-03-14 NOTE — Progress Notes (Signed)
Garret Reddish, MD Phone: [phone]  Subjective:   Tricia [name] is a 69 y.o. year old very pleasant female patient who presents with the following:  Hypertension-reasonable control on valsartan hctz last visit with recent changes BP Readings from Last 3 Encounters:  03/14/14 138/88  02/14/14 120/70  09/15/13 174/89  Home BP monitoring-has not checked recently Compliant with medications-yes without side effects ROS-Denies any CP, HA, SOB, blurry vision, LE edema, transient weakness, orthopnea, PND.   Recurrent UTI Patient reports that she took antibiotics (keflex QID x 7 days) for E. Coli sensitive to cephalosporins.  She reports she pees 8x a day with no recent increase. She has some urgency but no recent changes. Patient tells me today that these are her baseline but were not in fact new symptoms as I interpreted last visit. She requests urine evaluation today to ensure clearance.  ROS- no dysuria or fevers.   Past Medical History- Patient Active Problem List   Diagnosis Date Noted  . CAD (coronary artery disease) 09/22/2006    Priority: High  . History of gastric bypass 02/14/2014    Priority: Medium  . Former smoker 02/14/2014    Priority: Medium  . Diabetes mellitus type II, controlled 02/14/2014    Priority: Medium  . Anxiety state 10/03/2009    Priority: Medium  . ANEMIA, VITAMIN B12 DEFICIENCY 04/04/2009    Priority: Medium  . Hyperlipidemia 09/22/2006    Priority: Medium  . Essential hypertension 09/22/2006    Priority: Medium  . Low back pain 09/22/2006    Priority: Medium  . GERD (gastroesophageal reflux disease) 02/14/2014    Priority: Low  . Osteopenia 12/14/2006    Priority: Low  . Osteoarthritis 09/22/2006    Priority: Low  . Recurrent UTI 03/14/2014   Medications- reviewed and updated Current Outpatient Prescriptions  Medication Sig Dispense Refill  . aspirin 81 MG tablet Take 81 mg by mouth daily.      Marland Kitchen glucose blood test strip 1 each by  Other route daily. Use as instructed 100 each 5  . hydrochlorothiazide (HYDRODIURIL) 25 MG tablet Take 1 tablet (25 mg total) by mouth daily. 30 tablet 11  . isosorbide mononitrate (IMDUR) 60 MG 24 hr tablet TAKE ONE TABLET BY MOUTH ONE TIME DAILY  30 tablet 10  . metoprolol (LOPRESSOR) 50 MG tablet Take one tablet by mouth twice daily 180 tablet 3  . omeprazole (PRILOSEC) 40 MG capsule TAKE ONE CAPSULE BY MOUTH ONE TIME DAILY  30 capsule 0  . pravastatin (PRAVACHOL) 40 MG tablet Take 1 tablet (40 mg total) by mouth daily. 90 tablet 3  . valsartan (DIOVAN) 320 MG tablet Take 1 tablet (320 mg total) by mouth daily. 30 tablet 11  . ALPRAZolam (XANAX) 0.25 MG tablet Take 1 tablet (0.25 mg total) by mouth 3 (three) times daily as needed. (Patient not taking: Reported on 02/14/2014) 60 tablet 5  . HYDROcodone-acetaminophen (NORCO/VICODIN) 5-325 MG per tablet Take 1 tablet by mouth every 12 (twelve) hours as needed for moderate pain. (Patient not taking: Reported on 03/14/2014) 40 tablet 0  . loperamide (IMODIUM) 2 MG capsule Take 1 capsule (2 mg total) by mouth 4 (four) times daily as needed for diarrhea or loose stools. (Patient not taking: Reported on 02/14/2014) 12 capsule 0  . nitroGLYCERIN (NITROLINGUAL) 0.4 MG/SPRAY spray Place 1 spray under the tongue every 5 (five) minutes as needed. (Patient not taking: Reported on 02/14/2014) 12 g 3  . VENTOLIN HFA 108 (90 BASE) MCG/ACT inhaler  INHALE TWO PUFFS BY MOUTH EVERY SIX HOURS as needed for wheezing (Patient not taking: Reported on 03/14/2014) 18 g 0   No current facility-administered medications for this visit.    Objective: BP 138/88 mmHg  Temp(Src) 98.4 F (36.9 C)  Wt 224 lb (101.606 kg) Gen: NAD, resting comfortably CV: RRR no murmurs rubs or gallops Lungs: CTAB no crackles, wheeze, rhonchi Abdomen: soft/nontender/nondistended/normal bowel sounds. No rebound or guarding.  No suprapubic or CVA pain Skin: warm, dry, no rash  Neuro: grossly  normal, moves all extremities   Assessment/Plan:  Essential hypertension Reasonable control on Metoprolol, valsartan, hctz, imdur though higher than on benicar-hctz. Much more cost effective. Continue current medications.    Recurrent UTI Patient has had 3 E. Coli cases 08/2013 through 02/2014. Patient does not have improvement in symptoms despite sensitive antibiotics. It is not clear if these are true UTI or asymptomatic bacteruria. Given no worsening from baseline, we discussed not checking urine culture today.     Return precautions advised. July CPE. If patient would like a second opinion, especially in light of nephrectomy-could always send to urology for concern of UTI)  >50% of 25 minute office visit was spent on counseling (patient has had a history of "not having symptoms" when she had a disease such as stating she had no symptoms and normal ED workup when she was found to have CAD in past and requires extensive counseling in regards to workup of recurrent UTI, i counseled her about multiple cases of this and how it applies to current symptoms) and coordination of care

## 2014-03-14 NOTE — Assessment & Plan Note (Signed)
Reasonable control on Metoprolol, valsartan, hctz, imdur though higher than on benicar-hctz. Much more cost effective. Continue current medications.

## 2014-06-22 ENCOUNTER — Other Ambulatory Visit: Payer: Self-pay | Admitting: Gastroenterology

## 2014-06-23 ENCOUNTER — Telehealth: Payer: Self-pay | Admitting: Family Medicine

## 2014-06-23 ENCOUNTER — Other Ambulatory Visit: Payer: Self-pay | Admitting: *Deleted

## 2014-06-23 MED ORDER — OMEPRAZOLE 40 MG PO CPDR: 40.0000 mg | DELAYED_RELEASE_CAPSULE | Freq: Every day | ORAL | Status: DC

## 2014-06-23 MED ORDER — ISOSORBIDE MONONITRATE ER 60 MG PO TB24: 60.0000 mg | ORAL_TABLET | Freq: Every day | ORAL | Status: DC

## 2014-06-23 NOTE — Telephone Encounter (Signed)
Refill on what medication Mrs. Tricia [name]?

## 2014-06-23 NOTE — Telephone Encounter (Signed)
Medication sent in. 

## 2014-06-23 NOTE — Telephone Encounter (Signed)
Pt was getting this rx filled at her GI doctor. She now need a refill and is asking if Dr Yong Channel will rx this medication instead of her going back to her GI

## 2014-06-23 NOTE — Telephone Encounter (Signed)
Keba the med is omeprazole (PRILOSEC) 40 MG capsule

## 2014-07-10 ENCOUNTER — Telehealth: Payer: Self-pay

## 2014-07-10 NOTE — Telephone Encounter (Signed)
Spoke with pt., she will call later to set up appt.  She has hurt her shoulder and has to  take care of shoulder first.

## 2014-08-22 ENCOUNTER — Other Ambulatory Visit: Payer: Self-pay | Admitting: *Deleted

## 2014-08-22 MED ORDER — PRAVASTATIN SODIUM 40 MG PO TABS: 40.0000 mg | ORAL_TABLET | Freq: Every day | ORAL | Status: DC

## 2014-10-16 ENCOUNTER — Other Ambulatory Visit: Payer: Self-pay | Admitting: Family Medicine

## 2014-10-16 MED ORDER — BLOOD GLUCOSE MONITOR KIT
PACK | Status: DC
Start: 2014-10-16 — End: 2015-10-19

## 2014-10-16 MED ORDER — GLUCOSE BLOOD VI STRP
ORAL_STRIP | Status: DC
Start: 2014-10-16 — End: 2015-11-24

## 2014-10-16 MED ORDER — METOPROLOL TARTRATE 50 MG PO TABS: 50.0000 mg | ORAL_TABLET | Freq: Two times a day (BID) | ORAL | Status: DC

## 2014-10-28 ENCOUNTER — Other Ambulatory Visit: Payer: Self-pay | Admitting: Family Medicine

## 2014-11-30 ENCOUNTER — Other Ambulatory Visit: Payer: Self-pay | Admitting: Family Medicine

## 2014-12-22 ENCOUNTER — Other Ambulatory Visit: Payer: Self-pay | Admitting: Family Medicine

## 2015-01-01 ENCOUNTER — Other Ambulatory Visit: Payer: Self-pay | Admitting: Family Medicine

## 2015-01-01 MED ORDER — VALSARTAN 320 MG PO TABS: 320.0000 mg | ORAL_TABLET | Freq: Every day | ORAL | Status: DC

## 2015-01-01 MED ORDER — HYDROCHLOROTHIAZIDE 25 MG PO TABS: 25.0000 mg | ORAL_TABLET | Freq: Every day | ORAL | Status: DC

## 2015-04-21 ENCOUNTER — Other Ambulatory Visit: Payer: Self-pay | Admitting: Family Medicine

## 2015-06-04 ENCOUNTER — Telehealth: Payer: Self-pay | Admitting: Family Medicine

## 2015-06-04 NOTE — Telephone Encounter (Signed)
Needs visit

## 2015-06-04 NOTE — Telephone Encounter (Signed)
Please schedule OV per Dr. Yong Channel.

## 2015-06-04 NOTE — Telephone Encounter (Signed)
Pt has been scheduled.  °

## 2015-06-04 NOTE — Telephone Encounter (Signed)
See below

## 2015-06-04 NOTE — Telephone Encounter (Signed)
Pt request refill  HYDROcodone-acetaminophen (NORCO/VICODIN) 5-325 MG per tablet Pt not seen since 03/2014.  Advised pt she may need an appointment, but pt preferred to wait and see if Dr hunter will fill.  Also, pt is going to drop off handicap placard request for Dr Yong Channel to sign later this am.

## 2015-06-08 ENCOUNTER — Encounter: Payer: Self-pay | Admitting: Family Medicine

## 2015-06-08 ENCOUNTER — Ambulatory Visit (INDEPENDENT_AMBULATORY_CARE_PROVIDER_SITE_OTHER): Payer: PPO | Admitting: Family Medicine

## 2015-06-08 ENCOUNTER — Other Ambulatory Visit: Payer: Self-pay

## 2015-06-08 VITALS — BP 130/72 | HR 85 | Temp 97.7°F | Wt 221.0 lb

## 2015-06-08 DIAGNOSIS — I25119 Atherosclerotic heart disease of native coronary artery with unspecified angina pectoris: Secondary | ICD-10-CM | POA: Diagnosis not present

## 2015-06-08 DIAGNOSIS — E119 Type 2 diabetes mellitus without complications: Secondary | ICD-10-CM

## 2015-06-08 DIAGNOSIS — I10 Essential (primary) hypertension: Secondary | ICD-10-CM | POA: Diagnosis not present

## 2015-06-08 DIAGNOSIS — E785 Hyperlipidemia, unspecified: Secondary | ICD-10-CM | POA: Diagnosis not present

## 2015-06-08 DIAGNOSIS — M5442 Lumbago with sciatica, left side: Secondary | ICD-10-CM

## 2015-06-08 DIAGNOSIS — Z23 Encounter for immunization: Secondary | ICD-10-CM | POA: Diagnosis not present

## 2015-06-08 LAB — LIPID PANEL
Cholesterol: 162 mg/dL (ref 0–200)
HDL: 37.8 mg/dL — ABNORMAL LOW (ref >39.00)
LDL Cholesterol: 87 mg/dL (ref 0–99)
NonHDL: 124.16
Total CHOL/HDL Ratio: 4
Triglycerides: 186 mg/dL — ABNORMAL HIGH (ref 0.0–149.0)
VLDL: 37.2 mg/dL (ref 0.0–40.0)

## 2015-06-08 LAB — COMPREHENSIVE METABOLIC PANEL
ALT: 10 U/L (ref 0–35)
AST: 10 U/L (ref 0–37)
Albumin: 4 g/dL (ref 3.5–5.2)
Alkaline Phosphatase: 180 U/L — ABNORMAL HIGH (ref 39–117)
BUN: 20 mg/dL (ref 6–23)
CO2: 29 mEq/L (ref 19–32)
Calcium: 9.7 mg/dL (ref 8.4–10.5)
Chloride: 100 mEq/L (ref 96–112)
Creatinine, Ser: 1.25 mg/dL — ABNORMAL HIGH (ref 0.40–1.20)
GFR: 45.33 mL/min — ABNORMAL LOW (ref >60.00)
Glucose, Bld: 137 mg/dL — ABNORMAL HIGH (ref 70–99)
Potassium: 3.8 mEq/L (ref 3.5–5.1)
Sodium: 138 mEq/L (ref 135–145)
Total Bilirubin: 0.7 mg/dL (ref 0.2–1.2)
Total Protein: 6.5 g/dL (ref 6.0–8.3)

## 2015-06-08 LAB — CBC
HCT: 45.5 % (ref 36.0–46.0)
Hemoglobin: 15.5 g/dL — ABNORMAL HIGH (ref 12.0–15.0)
MCHC: 34 g/dL (ref 30.0–36.0)
MCV: 83.3 fl (ref 78.0–100.0)
Platelets: 238 10*3/uL (ref 150.0–400.0)
RBC: 5.47 Mil/uL — ABNORMAL HIGH (ref 3.87–5.11)
RDW: 13 % (ref 11.5–15.5)
WBC: 9 10*3/uL (ref 4.0–10.5)

## 2015-06-08 LAB — HEMOGLOBIN A1C: Hgb A1c MFr Bld: 6.6 % — ABNORMAL HIGH (ref 4.6–6.5)

## 2015-06-08 MED ORDER — ATORVASTATIN CALCIUM 20 MG PO TABS: 20.0000 mg | ORAL_TABLET | Freq: Every day | ORAL | Status: DC

## 2015-06-08 MED ORDER — HYDROCODONE-ACETAMINOPHEN 5-325 MG PO TABS: 1.0000 | ORAL_TABLET | Freq: Two times a day (BID) | ORAL | Status: DC | PRN

## 2015-06-08 MED ORDER — ISOSORBIDE MONONITRATE ER 60 MG PO TB24: ORAL_TABLET | ORAL | Status: DC

## 2015-06-08 NOTE — Assessment & Plan Note (Signed)
S: stable angina requirng nitroglycerin still sparing use twice in last 9 months. Not following with cardiology. Last stress test in 2007. Compliant with aspirin and pravastatin. Outside of mild chest pain infrequently relieved by nitroglycerin she is asymptomatic A/P: I strongly advised caridology visit to reestablish care and to consider updated stress test- she declines. She is aware of risks. She knows as she is not established if she has any acute issues- will have to go to emergency room/call 911. Continue aspirin and pravastatin but may need stronger statin

## 2015-06-08 NOTE — Patient Instructions (Addendum)
Final pneumonia shot received today ([id number]).  I am concerned by your last cholesterol numbers if you were taking cholesterol medicine. I suspect you are going to need a stronger medicine such as atorvastatin or rosuvastatin.   Refilled hydrocodone for sparing use. Thank you for bringing pills from 2016 which we will dispose of- given to Lucina Mellow, RN who will handle this. Please also bring supplies from 2015 at next visit.   I strongly advise every 6 month visits given complexity of your medical history.   Thank you for agreeing to mammogram  Encourage eye exam when finances are in better shape  I would also like for you to sign up for an annual wellness visit on a Friday with our nurse Manuela Schwartz. This is a free benefit under medicare that may help [place] find additional ways to help you.

## 2015-06-08 NOTE — Assessment & Plan Note (Signed)
S: well controlled. On no medicine after prior bypass CBGs- averaging 130 Lab Results  Component Value Date   HGBA1C 6.1 08/19/2013   HGBA1C 6.0 09/20/2012   HGBA1C 5.9 02/23/2012   A/P: check a1c today. Has some left lateral top of foot numbness- suspect coming from the back and not from diabetes. Declines diabetic eye exam against my advice- states she has to save for this.

## 2015-06-08 NOTE — Assessment & Plan Note (Signed)
S: controlled on metoprolol 50mg , valsartan 320mg , hctz 25mg , imdur 60mg .  BP Readings from Last 3 Encounters:  06/08/15 130/72  03/14/14 138/88  02/14/14 120/70  A/P:Continue current meds:  Doing well

## 2015-06-08 NOTE — Assessment & Plan Note (Signed)
S: poorly controlled on last check but unclear if she was on statin at time. No myalgias.  Lab Results  Component Value Date   CHOL 223* 08/19/2013   HDL 37.10* 08/19/2013   LDLCALC 142* 08/19/2013   LDLDIRECT 160.7 09/20/2012   TRIG 220.0* 08/19/2013   CHOLHDL 6 08/19/2013   A/P: will update lipids today- likely change to atorvastatin

## 2015-06-08 NOTE — Progress Notes (Signed)
Subjective:  Tricia [name] is a 70 y.o. year old very pleasant female patient who presents for/with See problem oriented charting ROS- rare chest pain relieved by nitro, no shortness of breath, no headaches or blurry vision  Past Medical History-  Patient Active Problem List   Diagnosis Date Noted  . CAD (coronary artery disease) 09/22/2006    Priority: High  . History of gastric bypass 02/14/2014    Priority: Medium  . Former smoker 02/14/2014    Priority: Medium  . Diabetes mellitus type II, controlled ([place]) 02/14/2014    Priority: Medium  . Anxiety state 10/03/2009    Priority: Medium  . ANEMIA, VITAMIN B12 DEFICIENCY 04/04/2009    Priority: Medium  . Hyperlipidemia 09/22/2006    Priority: Medium  . Essential hypertension 09/22/2006    Priority: Medium  . Low back pain 09/22/2006    Priority: Medium  . GERD (gastroesophageal reflux disease) 02/14/2014    Priority: Low  . Osteopenia 12/14/2006    Priority: Low  . Osteoarthritis 09/22/2006    Priority: Low  . Recurrent UTI 03/14/2014    Medications- reviewed and updated Current Outpatient Prescriptions  Medication Sig Dispense Refill  . ALPRAZolam (XANAX) 0.25 MG tablet Take 1 tablet (0.25 mg total) by mouth 3 (three) times daily as needed. (Patient not taking: Reported on 02/14/2014) 60 tablet 5  . aspirin 81 MG tablet Take 81 mg by mouth daily.      . blood glucose meter kit and supplies KIT Test once daily for glucose control 1 each 0  . glucose blood test strip Use as instructed 100 each 12  . hydrochlorothiazide (HYDRODIURIL) 25 MG tablet Take 1 tablet (25 mg total) by mouth daily. 30 tablet 11  . HYDROcodone-acetaminophen (NORCO/VICODIN) 5-325 MG tablet Take 1 tablet by mouth every 12 (twelve) hours as needed for moderate pain. 30 tablet 0  . isosorbide mononitrate (IMDUR) 60 MG 24 hr tablet TAKE 1 TABLET (60 MG TOTAL) BY MOUTH DAILY. 30 tablet 5  . loperamide (IMODIUM) 2 MG capsule Take 1 capsule (2 mg total) by  mouth 4 (four) times daily as needed for diarrhea or loose stools. (Patient not taking: Reported on 02/14/2014) 12 capsule 0  . metoprolol (LOPRESSOR) 50 MG tablet Take 1 tablet (50 mg total) by mouth 2 (two) times daily. 180 tablet 3  . nitroGLYCERIN (NITROLINGUAL) 0.4 MG/SPRAY spray Place 1 spray under the tongue every 5 (five) minutes as needed. (Patient not taking: Reported on 02/14/2014) 12 g 3  . omeprazole (PRILOSEC) 40 MG capsule TAKE 1 CAPSULE (40 MG TOTAL) BY MOUTH DAILY. 30 capsule 5  . pravastatin (PRAVACHOL) 40 MG tablet TAKE 1 TABLET (40 MG TOTAL) BY MOUTH DAILY. 90 tablet 2  . valsartan (DIOVAN) 320 MG tablet Take 1 tablet (320 mg total) by mouth daily. 30 tablet 11  . VENTOLIN HFA 108 (90 BASE) MCG/ACT inhaler INHALE TWO PUFFS BY MOUTH EVERY SIX HOURS as needed for wheezing (Patient not taking: Reported on 03/14/2014) 18 g 0   No current facility-administered medications for this visit.    Objective: BP 130/72 mmHg  Pulse 85  Temp(Src) 97.7 F (36.5 C)  Wt 221 lb (100.245 kg) Gen: NAD, resting comfortably CV: RRR no murmurs rubs or gallops Lungs: CTAB no crackles, wheeze, rhonchi Abdomen: soft/nontender/nondistended/normal bowel sounds. No rebound or guarding.  Ext: no edema Skin: warm, dry Neuro: grossly normal, moves all extremities  Diabetic Foot Exam - Simple   Simple Foot Form  Diabetic Foot exam  was performed with the following findings:  Yes 06/08/2015 10:57 AM  Visual Inspection  No deformities, no ulcerations, no other skin breakdown bilaterally:  Yes  Sensation Testing  See comments:  Yes  Pulse Check  Posterior Tibialis and Dorsalis pulse intact bilaterally:  Yes  Comments  Monofilament normal except left lateral foot- no sensation     Assessment/Plan:  CAD (coronary artery disease) S: stable angina requirng nitroglycerin still sparing use twice in last 9 months. Not following with cardiology. Last stress test in 2007. Compliant with aspirin and  pravastatin. Outside of mild chest pain infrequently relieved by nitroglycerin she is asymptomatic A/P: I strongly advised caridology visit to reestablish care and to consider updated stress test- she declines. She is aware of risks. She knows as she is not established if she has any acute issues- will have to go to emergency room/call 911. Continue aspirin and pravastatin but may need stronger statin   Essential hypertension S: controlled on metoprolol 75m, valsartan 3290m hctz 2577mimdur 24m69mBP Readings from Last 3 Encounters:  06/08/15 130/72  03/14/14 138/88  02/14/14 120/70  A/P:Continue current meds:  Doing well  Hyperlipidemia S: poorly controlled on last check but unclear if she was on statin at time. No myalgias.  Lab Results  Component Value Date   CHOL 223* 08/19/2013   HDL 37.10* 08/19/2013   LDLCALC 142* 08/19/2013   LDLDIRECT 160.7 09/20/2012   TRIG 220.0* 08/19/2013   CHOLHDL 6 08/19/2013   A/P: will update lipids today- likely change to atorvastatin  Low back pain S: Low back pain due to DDD. Does not want to have surgery though has been recommended in past per patient. #40 hydrocodone has lasted her since January 2016- uses sparingly on more difficult days. Has #18 left from 2016 which we will dispose of and she will bring 2015 bottle as well since expired.  A/P: refilled #30- if patient does end up taking over a year to return to care, consider smaller supply such as #10 at a time.   Diabetes mellitus type II, controlled S: well controlled. On no medicine after prior bypass CBGs- averaging 130 Lab Results  Component Value Date   HGBA1C 6.1 08/19/2013   HGBA1C 6.0 09/20/2012   HGBA1C 5.9 02/23/2012   A/P: check a1c today. Has some left lateral top of foot numbness- suspect coming from the back and not from diabetes. Declines diabetic eye exam against my advice- states she has to save for this.    Return in about 6 months (around 12/09/2015). advised 3  months but patient declined. Advised annual wellness visit as well Return precautions advised.   Orders Placed This Encounter  Procedures  . Pneumococcal polysaccharide vaccine 23-valent greater than or equal to 2yo subcutaneous/IM   Meds ordered this encounter  Medications  . isosorbide mononitrate (IMDUR) 60 MG 24 hr tablet    Sig: TAKE 1 TABLET (60 MG TOTAL) BY MOUTH DAILY.    Dispense:  30 tablet    Refill:  5  . HYDROcodone-acetaminophen (NORCO/VICODIN) 5-325 MG tablet    Sig: Take 1 tablet by mouth every 12 (twelve) hours as needed for moderate pain.    Dispense:  30 tablet    Refill:  0    StepGarret Reddish

## 2015-06-08 NOTE — Assessment & Plan Note (Signed)
S: Low back pain due to DDD. Does not want to have surgery though has been recommended in past per patient. #40 hydrocodone has lasted her since January 2016- uses sparingly on more difficult days. Has #18 left from 2016 which we will dispose of and she will bring 2015 bottle as well since expired.  A/P: refilled #30- if patient does end up taking over a year to return to care, consider smaller supply such as #10 at a time.

## 2015-06-22 ENCOUNTER — Ambulatory Visit (INDEPENDENT_AMBULATORY_CARE_PROVIDER_SITE_OTHER): Payer: PPO

## 2015-06-22 VITALS — BP 120/70 | HR 71 | Ht 62.0 in | Wt 217.2 lb

## 2015-06-22 DIAGNOSIS — Z0000 Encounter for general adult medical examination without abnormal findings: Secondary | ICD-10-CM

## 2015-06-22 DIAGNOSIS — Z7289 Other problems related to lifestyle: Secondary | ICD-10-CM

## 2015-06-22 NOTE — Patient Instructions (Addendum)
Tricia [name] , Thank you for taking time to come for your Medicare Wellness Visit. I appreciate your ongoing commitment to your health goals. Please review the following plan we discussed and let me know if I can assist you in the future.   Will try to schedule apt with eye doctor when she can afford too  May consider mammogram this year; be alert to early detection presents a different outcome!!!  Consider calcium and Vit D over the counter/ check food list for how much calcium you may be taking in your diet; will discuss Vit D lab at next visit.   Educated to check with insurance regarding coverage of cost of Shingles vaccination on Part D or Part B and may have lower co-pay if provided on the Part D side  Will also check on cost of TDAP ( which is the shot with pertussis or whooping cough in it )  Will check with pharmacy for cost as well and please let us know if you receive it to update your record.  These are the goals we discussed: Goals    . Weight < 180 lb (81.647 kg)     Is currently trying to lose weight What is the "Mediterranean" diet?  There's no one "Mediterranean" diet. At least 16 countries border the [employer]. Diets vary between these countries and also between regions within a country. Many differences in culture, ethnic background, religion, economy and agricultural production result in different diets. But the common Mediterranean dietary pattern has these characteristics: high consumption of fruits, vegetables, bread and other cereals, potatoes, beans, nuts and seeds  olive oil is an important monounsaturated fat source  dairy products, fish and poultry are consumed in low to moderate amounts, and little red meat is eaten  eggs are consumed zero to four times a week  wine is consumed in low to moderate amounts Does a Mediterranean-style diet follow American Heart Association dietary recommendations? Mediterranean-style diets are often close to our dietary  recommendations, but they don't follow them exactly. In general, the diets of Mediterranean peoples contain a relatively high percentage of calories from fat. This is thought to contribute to the increasing obesity in these countries, which is becoming a concern. People who follow the average Mediterranean diet eat less saturated fat than those who eat the average American diet. In fact, saturated fat consumption is well within our dietary guidelines. More than half the fat calories in a Mediterranean diet come from monounsaturated fats (mainly from olive oil). Monounsaturated fat doesn't raise blood cholesterol levels the way saturated fat does. The incidence of heart disease in [place] countries is lower than in the [place]. Death rates are lower, too. But this may not be entirely due to the diet. Lifestyle factors (such as more physical activity and extended social support systems) may also play a part. Before advising people to follow a Mediterranean diet, we need more studies to find out whether the diet itself or other lifestyle factors account for the lower deaths from heart disease.  American Heart Association        This is a list of the screening recommended for you and due dates:  Health Maintenance  Topic Date Due  .  Hepatitis C: One time screening is recommended by Center for Disease Control  (CDC) for  adults born from [birth year] through 1963.   [birth year]  . Eye exam for diabetics  09/02/1957  . Shingles Vaccine  09/03/2007  . Mammogram  05/12/2012  .  Tetanus Vaccine  02/04/2015  . Flu Shot  09/04/2015  . Hemoglobin A1C  12/09/2015  . Complete foot exam   06/07/2016  . Colon Cancer Screening  10/12/2017  . DEXA scan (bone density measurement)  Addressed  . Pneumonia vaccines  Completed   Health Maintenance, Female Adopting a healthy lifestyle and getting preventive care can go a long way to promote health and wellness. Talk with your health care provider about what  schedule of regular examinations is right for you. This is a good chance for you to check in with your provider about disease prevention and staying healthy. In between checkups, there are plenty of things you can do on your own. Experts have done a lot of research about which lifestyle changes and preventive measures are most likely to keep you healthy. Ask your health care provider for more information. WEIGHT AND DIET  Eat a healthy diet  Be sure to include plenty of vegetables, fruits, low-fat dairy products, and lean protein.  Do not eat a lot of foods high in solid fats, added sugars, or salt.  Get regular exercise. This is one of the most important things you can do for your health.  Most adults should exercise for at least 150 minutes each week. The exercise should increase your heart rate and make you sweat (moderate-intensity exercise).  Most adults should also do strengthening exercises at least twice a week. This is in addition to the moderate-intensity exercise.  Maintain a healthy weight  Body mass index (BMI) is a measurement that can be used to identify possible weight problems. It estimates body fat based on height and weight. Your health care provider can help determine your BMI and help you achieve or maintain a healthy weight.  For females 51 years of age and older:   A BMI below 18.5 is considered underweight.  A BMI of 18.5 to 24.9 is normal.  A BMI of 25 to 29.9 is considered overweight.  A BMI of 30 and above is considered obese.  Watch levels of cholesterol and blood lipids  You should start having your blood tested for lipids and cholesterol at 68 years of age, then have this test every 5 years.  You may need to have your cholesterol levels checked more often if:  Your lipid or cholesterol levels are high.  You are older than 68 years of age.  You are at high risk for heart disease.  CANCER SCREENING   Lung Cancer  Lung cancer screening is  recommended for adults 8-59 years old who are at high risk for lung cancer because of a history of smoking.  A yearly low-dose CT scan of the lungs is recommended for people who:  Currently smoke.  Have quit within the past 15 years.  Have at least a 30-pack-year history of smoking. A pack year is smoking an average of one pack of cigarettes a day for 1 year.  Yearly screening should continue until it has been 15 years since you quit.  Yearly screening should stop if you develop a health problem that would prevent you from having lung cancer treatment.  Breast Cancer  Practice breast self-awareness. This means understanding how your breasts normally appear and feel.  It also means doing regular breast self-exams. Let your health care provider know about any changes, no matter how small.  If you are in your 20s or 30s, you should have a clinical breast exam (CBE) by a health care provider every 1-3 years as  part of a regular health exam.  If you are 40 or older, have a CBE every year. Also consider having a breast X-ray (mammogram) every year.  If you have a family history of breast cancer, talk to your health care provider about genetic screening.  If you are at high risk for breast cancer, talk to your health care provider about having an MRI and a mammogram every year.  Breast cancer gene (BRCA) assessment is recommended for women who have family members with BRCA-related cancers. BRCA-related cancers include:  Breast.  Ovarian.  Tubal.  Peritoneal cancers.  Results of the assessment will determine the need for genetic counseling and BRCA1 and BRCA2 testing. Cervical Cancer Your health care provider may recommend that you be screened regularly for cancer of the pelvic organs (ovaries, uterus, and vagina). This screening involves a pelvic examination, including checking for microscopic changes to the surface of your cervix (Pap test). You may be encouraged to have this  screening done every 3 years, beginning at age 48.  For women ages 79-65, health care providers may recommend pelvic exams and Pap testing every 3 years, or they may recommend the Pap and pelvic exam, combined with testing for human papilloma virus (HPV), every 5 years. Some types of HPV increase your risk of cervical cancer. Testing for HPV may also be done on women of any age with unclear Pap test results.  Other health care providers may not recommend any screening for nonpregnant women who are considered low risk for pelvic cancer and who do not have symptoms. Ask your health care provider if a screening pelvic exam is right for you.  If you have had past treatment for cervical cancer or a condition that could lead to cancer, you need Pap tests and screening for cancer for at least 20 years after your treatment. If Pap tests have been discontinued, your risk factors (such as having a new sexual partner) need to be reassessed to determine if screening should resume. Some women have medical problems that increase the chance of getting cervical cancer. In these cases, your health care provider may recommend more frequent screening and Pap tests. Colorectal Cancer  This type of cancer can be detected and often prevented.  Routine colorectal cancer screening usually begins at 68 years of age and continues through 68 years of age.  Your health care provider may recommend screening at an earlier age if you have risk factors for colon cancer.  Your health care provider may also recommend using home test kits to check for hidden blood in the stool.  A small camera at the end of a tube can be used to examine your colon directly (sigmoidoscopy or colonoscopy). This is done to check for the earliest forms of colorectal cancer.  Routine screening usually begins at age 103.  Direct examination of the colon should be repeated every 5-10 years through 68 years of age. However, you may need to be screened  more often if early forms of precancerous polyps or small growths are found. Skin Cancer  Check your skin from head to toe regularly.  Tell your health care provider about any new moles or changes in moles, especially if there is a change in a mole's shape or color.  Also tell your health care provider if you have a mole that is larger than the size of a pencil eraser.  Always use sunscreen. Apply sunscreen liberally and repeatedly throughout the day.  Protect yourself by wearing long sleeves, pants,  a wide-brimmed hat, and sunglasses whenever you are outside. HEART DISEASE, DIABETES, AND HIGH BLOOD PRESSURE   High blood pressure causes heart disease and increases the risk of stroke. High blood pressure is more likely to develop in:  People who have blood pressure in the high end of the normal range (130-139/85-89 mm Hg).  People who are overweight or obese.  People who are African American.  If you are 37-60 years of age, have your blood pressure checked every 3-5 years. If you are 59 years of age or older, have your blood pressure checked every year. You should have your blood pressure measured twice--once when you are at a hospital or clinic, and once when you are not at a hospital or clinic. Record the average of the two measurements. To check your blood pressure when you are not at a hospital or clinic, you can use:  An automated blood pressure machine at a pharmacy.  A home blood pressure monitor.  If you are between 44 years and 45 years old, ask your health care provider if you should take aspirin to prevent strokes.  Have regular diabetes screenings. This involves taking a blood sample to check your fasting blood sugar level.  If you are at a normal weight and have a low risk for diabetes, have this test once every three years after 68 years of age.  If you are overweight and have a high risk for diabetes, consider being tested at a younger age or more often. PREVENTING  INFECTION  Hepatitis B  If you have a higher risk for hepatitis B, you should be screened for this virus. You are considered at high risk for hepatitis B if:  You were born in a country where hepatitis B is common. Ask your health care provider which countries are considered high risk.  Your parents were born in a high-risk country, and you have not been immunized against hepatitis B (hepatitis B vaccine).  You have HIV or AIDS.  You use needles to inject street drugs.  You live with someone who has hepatitis B.  You have had sex with someone who has hepatitis B.  You get hemodialysis treatment.  You take certain medicines for conditions, including cancer, organ transplantation, and autoimmune conditions. Hepatitis C  Blood testing is recommended for:  Everyone born from [birth year] through 1963.  Anyone with known risk factors for hepatitis C. Sexually transmitted infections (STIs)  You should be screened for sexually transmitted infections (STIs) including gonorrhea and chlamydia if:  You are sexually active and are younger than 68 years of age.  You are older than 68 years of age and your health care provider tells you that you are at risk for this type of infection.  Your sexual activity has changed since you were last screened and you are at an increased risk for chlamydia or gonorrhea. Ask your health care provider if you are at risk.  If you do not have HIV, but are at risk, it may be recommended that you take a prescription medicine daily to prevent HIV infection. This is called pre-exposure prophylaxis (PrEP). You are considered at risk if:  You are sexually active and do not regularly use condoms or know the HIV status of your partner(s).  You take drugs by injection.  You are sexually active with a partner who has HIV. Talk with your health care provider about whether you are at high risk of being infected with HIV. If you choose to begin PrEP,  you should first be  tested for HIV. You should then be tested every 3 months for as long as you are taking PrEP.  PREGNANCY   If you are premenopausal and you may become pregnant, ask your health care provider about preconception counseling.  If you may become pregnant, take 400 to 800 micrograms (mcg) of folic acid every day.  If you want to prevent pregnancy, talk to your health care provider about birth control (contraception). OSTEOPOROSIS AND MENOPAUSE   Osteoporosis is a disease in which the bones lose minerals and strength with aging. This can result in serious bone fractures. Your risk for osteoporosis can be identified using a bone density scan.  If you are 38 years of age or older, or if you are at risk for osteoporosis and fractures, ask your health care provider if you should be screened.  Ask your health care provider whether you should take a calcium or vitamin D supplement to lower your risk for osteoporosis.  Menopause may have certain physical symptoms and risks.  Hormone replacement therapy may reduce some of these symptoms and risks. Talk to your health care provider about whether hormone replacement therapy is right for you.  HOME CARE INSTRUCTIONS   Schedule regular health, dental, and eye exams.  Stay current with your immunizations.   Do not use any tobacco products including cigarettes, chewing tobacco, or electronic cigarettes.  If you are pregnant, do not drink alcohol.  If you are breastfeeding, limit how much and how often you drink alcohol.  Limit alcohol intake to no more than 1 drink per day for nonpregnant women. One drink equals 12 ounces of beer, 5 ounces of wine, or 1 ounces of hard liquor.  Do not use street drugs.  Do not share needles.  Ask your health care provider for help if you need support or information about quitting drugs.  Tell your health care provider if you often feel depressed.  Tell your health care provider if you have ever been abused or  do not feel safe at home.   This information is not intended to replace advice given to you by your health care provider. Make sure you discuss any questions you have with your health care provider.   Document Released: 08/05/2010 Document Revised: 02/10/2014 Document Reviewed: 12/22/2012 Elsevier Interactive Patient Education 2016 [employer].   Osteoporosis Osteoporosis is the thinning and loss of density in the bones. Osteoporosis makes the bones more brittle, fragile, and likely to break (fracture). Over time, osteoporosis can cause the bones to become so weak that they fracture after a simple fall. The bones most likely to fracture are the bones in the hip, wrist, and spine. CAUSES  The exact cause is not known. RISK FACTORS Anyone can develop osteoporosis. You may be at greater risk if you have a family history of the condition or have poor nutrition. You may also have a higher risk if you are:   Female.   73 years old or older.  A smoker.  Not physically active.   White or Asian.  Slender. SIGNS AND SYMPTOMS  A fracture might be the first sign of the disease, especially if it results from a fall or injury that would not usually cause a bone to break. Other signs and symptoms include:   Low back and neck pain.  Stooped posture.  Height loss. DIAGNOSIS  To make a diagnosis, your health care provider may:  Take a medical history.  Perform a physical exam.  Order tests, such as:  A bone mineral density test.  A dual-energy X-ray absorptiometry test. TREATMENT  The goal of osteoporosis treatment is to strengthen your bones to reduce your risk of a fracture. Treatment may involve:  Making lifestyle changes, such as:  Eating a diet rich in calcium.  Doing weight-bearing and muscle-strengthening exercises.  Stopping tobacco use.  Limiting alcohol intake.  Taking medicine to slow the process of bone loss or to increase bone density.  Monitoring your  levels of calcium and vitamin D. HOME CARE INSTRUCTIONS  Include calcium and vitamin D in your diet. Calcium is important for bone health, and vitamin D helps the body absorb calcium.  Perform weight-bearing and muscle-strengthening exercises as directed by your health care provider.  Do not use any tobacco products, including cigarettes, chewing tobacco, and electronic cigarettes. If you need help quitting, ask your health care provider.  Limit your alcohol intake.  Take medicines only as directed by your health care provider.  Keep all follow-up visits as directed by your health care provider. This is important.  Take precautions at home to lower your risk of falling, such as:  Keeping rooms well lit and clutter free.  Installing safety rails on stairs.  Using rubber mats in the bathroom and other areas that are often wet or slippery. SEEK IMMEDIATE MEDICAL CARE IF:  You fall or injure yourself.    This information is not intended to replace advice given to you by your health care provider. Make sure you discuss any questions you have with your health care provider.   Document Released: 10/30/2004 Document Revised: 02/10/2014 Document Reviewed: 06/30/2013 Elsevier Interactive Patient Education 2016 [place].   Bone Densitometry Bone densitometry is an imaging test that uses a special X-ray to measure the amount of calcium and other minerals in your bones (bone density). This test is also known as a bone mineral density test or dual-energy X-ray absorptiometry (DXA). The test can measure bone density at your hip and your spine. It is similar to having a regular X-ray. You may have this test to:  Diagnose a condition that causes weak or thin bones (osteoporosis).  Predict your risk of a broken bone (fracture).  Determine how well osteoporosis treatment is working. LET [facility] CARE PROVIDER KNOW ABOUT:  Any allergies you have.  All medicines you are taking,  including vitamins, herbs, eye drops, creams, and over-the-counter medicines.  Previous problems you or members of your family have had with the use of anesthetics.  Any blood disorders you have.  Previous surgeries you have had.  Medical conditions you have.  Possibility of pregnancy.  Any other medical test you had within the previous 14 days that used contrast material. RISKS AND COMPLICATIONS Generally, this is a safe procedure. However, problems can occur and may include the following:  This test exposes you to a very small amount of radiation.  The risks of radiation exposure may be greater to unborn children. BEFORE THE PROCEDURE  Do not take any calcium supplements for 24 hours before having the test. You can otherwise eat and drink what you usually do.  Take off all metal jewelry, eyeglasses, dental appliances, and any other metal objects. PROCEDURE  You may lie on an exam table. There will be an X-ray generator below you and an imaging device above you.  Other devices, such as boxes or braces, may be used to position your body properly for the scan.  You will need to lie still  while the machine slowly scans your body.  The images will show up on a computer monitor. AFTER THE PROCEDURE You may need more testing at a later time.   This information is not intended to replace advice given to you by your health care provider. Make sure you discuss any questions you have with your health care provider.   Document Released: 02/12/2004 Document Revised: 02/10/2014 Document Reviewed: 06/30/2013 Elsevier Interactive Patient Education 2016 [place].  Diabetes and Foot Care Diabetes may cause you to have problems because of poor blood supply (circulation) to your feet and legs. This may cause the skin on your feet to become thinner, break easier, and heal more slowly. Your skin may become dry, and the skin may peel and crack. You may also have nerve damage in your legs  and feet causing decreased feeling in them. You may not notice minor injuries to your feet that could lead to infections or more serious problems. Taking care of your feet is one of the most important things you can do for yourself.  HOME CARE INSTRUCTIONS  Wear shoes at all times, even in the house. Do not go barefoot. Bare feet are easily injured.  Check your feet daily for blisters, cuts, and redness. If you cannot see the bottom of your feet, use a mirror or ask someone for help.  Wash your feet with warm water (do not use hot water) and mild soap. Then pat your feet and the areas between your toes until they are completely dry. Do not soak your feet as this can dry your skin.  Apply a moisturizing lotion or petroleum jelly (that does not contain alcohol and is unscented) to the skin on your feet and to dry, brittle toenails. Do not apply lotion between your toes.  Trim your toenails straight across. Do not dig under them or around the cuticle. File the edges of your nails with an emery board or nail file.  Do not cut corns or calluses or try to remove them with medicine.  Wear clean socks or stockings every day. Make sure they are not too tight. Do not wear knee-high stockings since they may decrease blood flow to your legs.  Wear shoes that fit properly and have enough cushioning. To break in new shoes, wear them for just a few hours a day. This prevents you from injuring your feet. Always look in your shoes before you put them on to be sure there are no objects inside.  Do not cross your legs. This may decrease the blood flow to your feet.  If you find a minor scrape, cut, or break in the skin on your feet, keep it and the skin around it clean and dry. These areas may be cleansed with mild soap and water. Do not cleanse the area with peroxide, alcohol, or iodine.  When you remove an adhesive bandage, be sure not to damage the skin around it.  If you have a wound, look at it several  times a day to make sure it is healing.  Do not use heating pads or hot water bottles. They may burn your skin. If you have lost feeling in your feet or legs, you may not know it is happening until it is too late.  Make sure your health care provider performs a complete foot exam at least annually or more often if you have foot problems. Report any cuts, sores, or bruises to your health care provider immediately. SEEK MEDICAL CARE IF:  You have an injury that is not healing.  You have cuts or breaks in the skin.  You have an ingrown nail.  You notice redness on your legs or feet.  You feel burning or tingling in your legs or feet.  You have pain or cramps in your legs and feet.  Your legs or feet are numb.  Your feet always feel cold. SEEK IMMEDIATE MEDICAL CARE IF:   There is increasing redness, swelling, or pain in or around a wound.  There is a red line that goes up your leg.  Pus is coming from a wound.  You develop a fever or as directed by your health care provider.  You notice a bad smell coming from an ulcer or wound.   This information is not intended to replace advice given to you by your health care provider. Make sure you discuss any questions you have with your health care provider.   Document Released: 01/18/2000 Document Revised: 09/22/2012 Document Reviewed: 06/29/2012 Elsevier Interactive Patient Education 2016 [place] in the Home  Falls can cause injuries. They can happen to people of all ages. There are many things you can do to make your home safe and to help prevent falls.  WHAT CAN I DO ON THE OUTSIDE OF MY HOME?  Regularly fix the edges of walkways and driveways and fix any cracks.  Remove anything that might make you trip as you walk through a door, such as a raised step or threshold.  Trim any bushes or trees on the path to your home.  Use bright outdoor lighting.  Clear any walking paths of anything that might make  someone trip, such as rocks or tools.  Regularly check to see if handrails are loose or broken. Make sure that both sides of any steps have handrails.  Any raised decks and porches should have guardrails on the edges.  Have any leaves, snow, or ice cleared regularly.  Use sand or salt on walking paths during winter.  Clean up any spills in your garage right away. This includes oil or grease spills. WHAT CAN I DO IN THE BATHROOM?   Use night lights.  Install grab bars by the toilet and in the tub and shower. Do not use towel bars as grab bars.  Use non-skid mats or decals in the tub or shower.  If you need to sit down in the shower, use a plastic, non-slip stool.  Keep the floor dry. Clean up any water that spills on the floor as soon as it happens.  Remove soap buildup in the tub or shower regularly.  Attach bath mats securely with double-sided non-slip rug tape.  Do not have throw rugs and other things on the floor that can make you trip. WHAT CAN I DO IN THE BEDROOM?  Use night lights.  Make sure that you have a light by your bed that is easy to reach.  Do not use any sheets or blankets that are too big for your bed. They should not hang down onto the floor.  Have a firm chair that has side arms. You can use this for support while you get dressed.  Do not have throw rugs and other things on the floor that can make you trip. WHAT CAN I DO IN THE KITCHEN?  Clean up any spills right away.  Avoid walking on wet floors.  Keep items that you use a lot in easy-to-reach places.  If you need to reach  something above you, use a strong step stool that has a grab bar.  Keep electrical cords out of the way.  Do not use floor polish or wax that makes floors slippery. If you must use wax, use non-skid floor wax.  Do not have throw rugs and other things on the floor that can make you trip. WHAT CAN I DO WITH MY STAIRS?  Do not leave any items on the stairs.  Make sure that  there are handrails on both sides of the stairs and use them. Fix handrails that are broken or loose. Make sure that handrails are as long as the stairways.  Check any carpeting to make sure that it is firmly attached to the stairs. Fix any carpet that is loose or worn.  Avoid having throw rugs at the top or bottom of the stairs. If you do have throw rugs, attach them to the floor with carpet tape.  Make sure that you have a light switch at the top of the stairs and the bottom of the stairs. If you do not have them, ask someone to add them for you. WHAT ELSE CAN I DO TO HELP PREVENT FALLS?  Wear shoes that:  Do not have high heels.  Have rubber bottoms.  Are comfortable and fit you well.  Are closed at the toe. Do not wear sandals.  If you use a stepladder:  Make sure that it is fully opened. Do not climb a closed stepladder.  Make sure that both sides of the stepladder are locked into place.  Ask someone to hold it for you, if possible.  Clearly mark and make sure that you can see:  Any grab bars or handrails.  First and last steps.  Where the edge of each step is.  Use tools that help you move around (mobility aids) if they are needed. These include:  Canes.  Walkers.  Scooters.  Crutches.  Turn on the lights when you go into a dark area. Replace any light bulbs as soon as they burn out.  Set up your furniture so you have a clear path. Avoid moving your furniture around.  If any of your floors are uneven, fix them.  If there are any pets around you, be aware of where they are.  Review your medicines with your doctor. Some medicines can make you feel dizzy. This can increase your chance of falling. Ask your doctor what other things that you can do to help prevent falls.   This information is not intended to replace advice given to you by your health care provider. Make sure you discuss any questions you have with your health care provider.   Document  Released: 11/16/2008 Document Revised: 06/06/2014 Document Reviewed: 02/24/2014 Elsevier Interactive Patient Education [employer].
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
## 2015-06-30 ENCOUNTER — Other Ambulatory Visit: Payer: Self-pay | Admitting: Family Medicine

## 2015-07-03 ENCOUNTER — Other Ambulatory Visit: Payer: Self-pay | Admitting: General Practice

## 2015-07-13 ENCOUNTER — Telehealth: Payer: Self-pay | Admitting: Family Medicine

## 2015-07-13 NOTE — Telephone Encounter (Signed)
She will need to wait until CPE to do this. There is no such thing as a vitamin panel and we would need indications for these given medicare patient.

## 2015-07-13 NOTE — Telephone Encounter (Signed)
Just an FYI

## 2015-07-13 NOTE — Telephone Encounter (Signed)
Pt would like to have a Vitamin panel done when she has her CPX on 6/12.

## 2015-07-16 ENCOUNTER — Other Ambulatory Visit: Payer: PPO

## 2015-07-16 DIAGNOSIS — Z7289 Other problems related to lifestyle: Secondary | ICD-10-CM

## 2015-07-16 DIAGNOSIS — Z0000 Encounter for general adult medical examination without abnormal findings: Secondary | ICD-10-CM

## 2015-07-16 NOTE — Telephone Encounter (Signed)
Patient presented this AM for labs and had a list on ipad of at least 15 labs -had Nana tell her we would have to discuss at an office visit. He stated she had upcoming visit so I advised hre to come fasting.   Tricia [name], you can close visit

## 2015-07-16 NOTE — Telephone Encounter (Signed)
Left a voicemail message for patient to return call

## 2015-07-26 DIAGNOSIS — E119 Type 2 diabetes mellitus without complications: Secondary | ICD-10-CM | POA: Diagnosis not present

## 2015-07-26 DIAGNOSIS — H2513 Age-related nuclear cataract, bilateral: Secondary | ICD-10-CM | POA: Diagnosis not present

## 2015-07-27 ENCOUNTER — Encounter: Payer: Self-pay | Admitting: Family Medicine

## 2015-07-27 ENCOUNTER — Encounter: Payer: PPO | Admitting: Family Medicine

## 2015-07-27 ENCOUNTER — Ambulatory Visit (INDEPENDENT_AMBULATORY_CARE_PROVIDER_SITE_OTHER): Payer: PPO | Admitting: Family Medicine

## 2015-07-27 VITALS — BP 96/56 | HR 87 | Temp 97.5°F | Ht 62.0 in | Wt 207.0 lb

## 2015-07-27 DIAGNOSIS — N179 Acute kidney failure, unspecified: Secondary | ICD-10-CM

## 2015-07-27 DIAGNOSIS — I25119 Atherosclerotic heart disease of native coronary artery with unspecified angina pectoris: Secondary | ICD-10-CM

## 2015-07-27 DIAGNOSIS — Z0000 Encounter for general adult medical examination without abnormal findings: Secondary | ICD-10-CM

## 2015-07-27 DIAGNOSIS — E669 Obesity, unspecified: Secondary | ICD-10-CM | POA: Diagnosis not present

## 2015-07-27 DIAGNOSIS — I10 Essential (primary) hypertension: Secondary | ICD-10-CM

## 2015-07-27 DIAGNOSIS — Z7289 Other problems related to lifestyle: Secondary | ICD-10-CM

## 2015-07-27 DIAGNOSIS — Z0001 Encounter for general adult medical examination with abnormal findings: Secondary | ICD-10-CM | POA: Diagnosis not present

## 2015-07-27 DIAGNOSIS — E119 Type 2 diabetes mellitus without complications: Secondary | ICD-10-CM | POA: Diagnosis not present

## 2015-07-27 DIAGNOSIS — M5442 Lumbago with sciatica, left side: Secondary | ICD-10-CM

## 2015-07-27 DIAGNOSIS — K912 Postsurgical malabsorption, not elsewhere classified: Secondary | ICD-10-CM | POA: Diagnosis not present

## 2015-07-27 DIAGNOSIS — Z9889 Other specified postprocedural states: Secondary | ICD-10-CM

## 2015-07-27 DIAGNOSIS — Z7722 Contact with and (suspected) exposure to environmental tobacco smoke (acute) (chronic): Secondary | ICD-10-CM

## 2015-07-27 DIAGNOSIS — E785 Hyperlipidemia, unspecified: Secondary | ICD-10-CM

## 2015-07-27 DIAGNOSIS — Z9884 Bariatric surgery status: Secondary | ICD-10-CM

## 2015-07-27 LAB — TSH: TSH: 3.32 u[IU]/mL (ref 0.35–4.50)

## 2015-07-27 LAB — BASIC METABOLIC PANEL
BUN: 64 mg/dL — ABNORMAL HIGH (ref 6–23)
CO2: 28 mEq/L (ref 19–32)
Calcium: 10.3 mg/dL (ref 8.4–10.5)
Chloride: 96 mEq/L (ref 96–112)
Creatinine, Ser: 1.38 mg/dL — ABNORMAL HIGH (ref 0.40–1.20)
GFR: 40.42 mL/min — ABNORMAL LOW (ref >60.00)
Glucose, Bld: 104 mg/dL — ABNORMAL HIGH (ref 70–99)
Potassium: 3.9 mEq/L (ref 3.5–5.1)
Sodium: 135 mEq/L (ref 135–145)

## 2015-07-27 LAB — CALCIUM: Calcium: 10.3 mg/dL (ref 8.4–10.5)

## 2015-07-27 LAB — VITAMIN D 25 HYDROXY (VIT D DEFICIENCY, FRACTURES): VITD: 28.95 ng/mL — ABNORMAL LOW (ref 30.00–100.00)

## 2015-07-27 LAB — PHOSPHORUS: Phosphorus: 3.6 mg/dL (ref 2.3–4.6)

## 2015-07-27 LAB — FERRITIN: Ferritin: 47.7 ng/mL (ref 10.0–291.0)

## 2015-07-27 LAB — MAGNESIUM: Magnesium: 2.4 mg/dL (ref 1.5–2.5)

## 2015-07-27 LAB — VITAMIN B12: Vitamin B-12: 610 pg/mL (ref 211–911)

## 2015-07-27 MED ORDER — TIZANIDINE HCL 2 MG PO CAPS: 2.0000 mg | ORAL_CAPSULE | Freq: Three times a day (TID) | ORAL | Status: DC | PRN

## 2015-07-27 NOTE — Progress Notes (Signed)
Received the below medications and quantity from patient(quantity indicated for controlled substances):  Alprazolam 0.25mg  - Qty: 61 Hydrocodone-acetaminophen (Norco) - Qty: 13 Pravastatin    Witness of medications received: Dierdre Searles, LPN  Medication location: medication box in office

## 2015-07-27 NOTE — Progress Notes (Signed)
Pre visit review using our clinic review tool, if applicable. No additional management support is needed unless otherwise documented below in the visit note. 

## 2015-07-27 NOTE — Progress Notes (Signed)
Phone: [phone]  Subjective:  Patient presents today for their annual physical. Chief complaint-noted.   See problem oriented charting- ROS- full  review of systems was completed and negative except for: low back pain  The following were reviewed and entered/updated in epic: Past Medical History  Diagnosis Date  . CAD (coronary artery disease)   . Hypertension   . Hyperlipidemia   . Obesity   . Anxiety   . Sleep apnea   . Diabetes mellitus   . Dermatophytosis of groin and perianal area   . Anemia   . B12 deficiency   . Osteoporosis   . Depression   . Arthritis   . Low back pain     l5 disc  . Diverticulosis   . TOBACCO ABUSE 10/05/2009    Qualifier: Diagnosis of  By: Stanford Breed, MD, Kandyce Rud   . Adenomatous polyp of colon 02/2004  . DEPRESSION 09/22/2006    Qualifier: Diagnosis of  By: Jimmye Norman, LPN, Winfield Cunas   . Sleep apnea 10/03/2009    Resolved after gastric bypass     Patient Active Problem List   Diagnosis Date Noted  . Second hand smoke exposure 07/28/2015    Priority: High  . CAD (coronary artery disease) 09/22/2006    Priority: High  . History of gastric bypass 02/14/2014    Priority: Medium  . Former smoker 02/14/2014    Priority: Medium  . Diabetes mellitus type II, controlled ([place]) 02/14/2014    Priority: Medium  . Anxiety state 10/03/2009    Priority: Medium  . ANEMIA, VITAMIN B12 DEFICIENCY 04/04/2009    Priority: Medium  . Hyperlipidemia 09/22/2006    Priority: Medium  . Essential hypertension 09/22/2006    Priority: Medium  . Low back pain 09/22/2006    Priority: Medium  . GERD (gastroesophageal reflux disease) 02/14/2014    Priority: Low  . Osteopenia 12/14/2006    Priority: Low  . Osteoarthritis 09/22/2006    Priority: Low  . Postoperative malabsorption 07/27/2015  . Recurrent UTI 03/14/2014   Past Surgical History  Procedure Laterality Date  . Abdominal hysterectomy    . Bariatric surgery    . Coronary angioplasty   2002    2 times  . Appendectomy    . Carpal tunnel release      bilateral  . Hernia repair    . Tonsillectomy    . Ulnar nerve release      and thumb surgery  . Total knee arthroplasty      L  . Nephrectomy      partial  . Lumbar laminectomy      l3-4  . Cholecystectomy    . Tubal ligation    . Cervical disc surgery      Family History  Problem Relation Age of Onset  . Coronary artery disease    . Colitis Mother     sepsis from c dif colitis  . Heart disease Father   . Stomach cancer Maternal Uncle     Medications- reviewed and updated Current Outpatient Prescriptions  Medication Sig Dispense Refill  . aspirin 81 MG tablet Take 81 mg by mouth daily.      Marland Kitchen atorvastatin (LIPITOR) 20 MG tablet Take 1 tablet (20 mg total) by mouth daily. 90 tablet 3  . blood glucose meter kit and supplies KIT Test once daily for glucose control 1 each 0  . glucose blood test strip Use as instructed 100 each 12  . hydrochlorothiazide (HYDRODIURIL) 25 MG tablet  Take 1 tablet (25 mg total) by mouth daily. 30 tablet 11  . HYDROcodone-acetaminophen (NORCO/VICODIN) 5-325 MG tablet Take 1 tablet by mouth every 12 (twelve) hours as needed for moderate pain. 30 tablet 0  . isosorbide mononitrate (IMDUR) 60 MG 24 hr tablet TAKE 1 TABLET (60 MG TOTAL) BY MOUTH DAILY. 30 tablet 5  . metoprolol (LOPRESSOR) 50 MG tablet Take 1 tablet (50 mg total) by mouth 2 (two) times daily. 180 tablet 3  . nitroGLYCERIN (NITROLINGUAL) 0.4 MG/SPRAY spray Place 1 spray under the tongue every 5 (five) minutes as needed. 12 g 3  . omeprazole (PRILOSEC) 40 MG capsule TAKE 1 CAPSULE (40 MG TOTAL) BY MOUTH DAILY. 30 capsule 5  . valsartan (DIOVAN) 320 MG tablet Take 1 tablet (320 mg total) by mouth daily. 30 tablet 11  . VENTOLIN HFA 108 (90 BASE) MCG/ACT inhaler INHALE TWO PUFFS BY MOUTH EVERY SIX HOURS as needed for wheezing 18 g 0   No current facility-administered medications for this visit.    Allergies-reviewed and  updated Allergies  Allergen Reactions  . Sulfonamide Derivatives Diarrhea, Nausea Only and Other (See Comments)    Also, stomach cramps    Social History   Social History  . Marital Status: Married    Spouse Name: N/A  . Number of Children: N/A  . Years of Education: N/A   Occupational History  . retired    Social History Main Topics  . Smoking status: Former Smoker -- 1.50 packs/day for 35 years    Types: Cigarettes    Quit date: 03/06/2008  . Smokeless tobacco: Never Used  . Alcohol Use: No  . Drug Use: No  . Sexual Activity: Yes   Other Topics Concern  . None   Social History Narrative   Widowed in 04/2012. 2 children. 3 grandkids.    Lives alone. Completely indendent.       Disabled/retired. Disabled-lifted computer paper      Hobbies: spend money-gamble    Objective: BP 96/56 mmHg  Pulse 87  Temp(Src) 97.5 F (36.4 C) (Oral)  Ht '5\' 2"'  (1.575 m)  Wt 207 lb (93.895 kg)  BMI 37.85 kg/m2  SpO2 94% Gen: NAD, resting comfortably Disgruntled when talking about health maintainence HEENT: Mucous membranes are moist. Oropharynx normal Neck: no thyromegaly CV: RRR no murmurs rubs or gallops Lungs: CTAB no crackles, wheeze, rhonchi Abdomen: soft/nontender/nondistended/normal bowel sounds. No rebound or guarding.  Ext: no edema Skin: warm, dry Neuro: grossly normal, moves all extremities, PERRLA  Assessment/Plan:  70 y.o. female presenting for annual physical.  Health Maintenance counseling: 1. Anticipatory guidance: Patient counseled regarding regular dental exams, eye exams, wearing seatbelts.  2. Risk factor reduction:  Advised patient of need for regular exercise and diet rich and fruits and vegetables to reduce risk of heart attack and stroke.  3. Immunizations/screenings/ancillary studies Immunization History  Administered Date(s) Administered  . Influenza Split 01/21/2011  . Influenza Whole 11/19/2007  . Influenza-Unspecified 12/18/2013  .  Pneumococcal Conjugate-13 08/26/2013  . Pneumococcal Polysaccharide-23 02/04/2003, 06/08/2015  . Td 02/03/2005   Health Maintenance Due  Topic Date Due  . Hepatitis C Screening - today February 07, 1945  . OPHTHALMOLOGY EXAM - got yesterday, to be faxed 09/02/1957  . ZOSTAVAX - today 09/03/2007  . MAMMOGRAM - gave handout, declines, states she already has and will call when ready 05/12/2012  . TETANUS/TDAP - today 02/04/2015   4. Cervical cancer screening- aged out, denies abnormal pap history, hysterectomy for fibroids 5. Breast cancer screening-  breast exam defers and mammogram - gave handout, seems resistant 6. Colon cancer screening - 10/12/12 with 5 year follow up  Status of chronic or acute concerns    AKI- unfortunately labs reveal a BUN up to 64 and creatinine up to 1.38. Hold valsartan 320, hctz 21m and repeat bmet next week while increasing fluids  Brings hydrocodone and xanax for disposal- will give to MLucina Mellow RN for aid in disposing.        Low back pain S: DJD. Followed by ortho in past-refuses surgery. Sparing norco in past- brougth bottle nad disposed of 07/28/15 A/P: asks for muscle relaxant and provided- preferable to narcotic   Hyperlipidemia HLD- controlled on atorvastatin 248mlikely, ldl was 87 on pravastatin so we changed medication, intended to order ldl but ultimately this was not ordered  Essential hypertension HTN- controlled- same meds including hctz 2558mimdur 54m66metoprolol 50mg62m, valsartan 320mg.11ml hold hctz and valsartan due to AKI and follow up next week  Diabetes mellitus type II, controlled DM- no medicine after bypass, controlled Lab Results  Component Value Date   HGBA1C 6.6* 06/08/2015     CAD (coronary artery disease) CAD- stable angina with once every 3-4 months nitro spray (also on imdur, asa, metoprolol). Declines cards follow up  Second hand smoke exposure Second hand smoke- advised to avoid, smells of smoke, denies  personally smoking. Refuses cessation of being around friends. Does not want to be asked about this   Next week to follow up on AKI. 6 months regular.   Orders Placed This Encounter  Procedures  . Vitamin B1  . Vitamin B12  . Vitamin B6  . VITAMIN D 25 Hydroxy (Vit-D Deficiency, Fractures)    [place]  . PTH, Intact and Calcium  . Ferritin  . Iron and TIBC  . Vitamin A  . Vitamin E  . Ceruloplasmin  . Calcium  . Magnesium  . Phosphorus  . Zinc  . Folate RBC  . Prealbumin  . DHEA  . TSH    [place]  . Hepatitis C antibody, reflex    [facility]  . Basic metabolic panel    [place]  . Hepatitis C antibody    Meds ordered this encounter  Medications  . tizanidine (ZANAFLEX) 2 MG capsule    Sig: Take 1 capsule (2 mg total) by mouth 3 (three) times daily as needed for muscle spasms.    Dispense:  60 capsule    Refill:  2    Return precautions advised.   StepheGarret Reddish

## 2015-07-27 NOTE — Patient Instructions (Addendum)
Labs before you go  Will dispose of your medications  Would love to have you in 3-4 months from now to recheck the diabetes- we can order labs at time of visit

## 2015-07-28 ENCOUNTER — Encounter: Payer: Self-pay | Admitting: Family Medicine

## 2015-07-28 DIAGNOSIS — Z7722 Contact with and (suspected) exposure to environmental tobacco smoke (acute) (chronic): Secondary | ICD-10-CM | POA: Insufficient documentation

## 2015-07-28 LAB — HEPATITIS C ANTIBODY: HCV Ab: NEGATIVE

## 2015-07-28 LAB — FOLATE RBC: RBC Folate: 636 ng/mL (ref >280)

## 2015-07-28 LAB — IRON AND TIBC
%SAT: 22 % (ref 11–50)
Iron: 69 ug/dL (ref 45–160)
TIBC: 313 ug/dL (ref 250–450)
UIBC: 244 ug/dL (ref 125–400)

## 2015-07-28 NOTE — Assessment & Plan Note (Signed)
Second hand smoke- advised to avoid, smells of smoke, denies personally smoking. Refuses cessation of being around friends. Does not want to be asked about this

## 2015-07-28 NOTE — Assessment & Plan Note (Signed)
S: DJD. Followed by ortho in past-refuses surgery. Sparing norco in past- brougth bottle nad disposed of 07/28/15 A/P: asks for muscle relaxant and provided- preferable to narcotic

## 2015-07-28 NOTE — Assessment & Plan Note (Signed)
HLD- controlled on atorvastatin 20mg  likely, ldl was 87 on pravastatin so we changed medication, intended to order ldl but ultimately this was not ordered

## 2015-07-28 NOTE — Assessment & Plan Note (Signed)
CAD- stable angina with once every 3-4 months nitro spray (also on imdur, asa, metoprolol). Declines cards follow up

## 2015-07-28 NOTE — Assessment & Plan Note (Signed)
HTN- controlled- same meds including hctz 25mg , imdur 60mg , metoprolol 50mg  BID, valsartan 320mg . Will hold hctz and valsartan due to AKI and follow up next week

## 2015-07-28 NOTE — Assessment & Plan Note (Signed)
DM- no medicine after bypass, controlled Lab Results  Component Value Date   HGBA1C 6.6* 06/08/2015

## 2015-07-30 ENCOUNTER — Other Ambulatory Visit: Payer: Self-pay

## 2015-07-30 DIAGNOSIS — I10 Essential (primary) hypertension: Secondary | ICD-10-CM

## 2015-07-30 LAB — PTH, INTACT AND CALCIUM
Calcium: 9.9 mg/dL (ref 8.4–10.5)
PTH: 157 pg/mL — ABNORMAL HIGH (ref 14–64)

## 2015-07-30 LAB — CERULOPLASMIN: Ceruloplasmin: 54 mg/dL — ABNORMAL HIGH (ref 18–53)

## 2015-07-30 LAB — PREALBUMIN: Prealbumin: 26 mg/dL (ref 17–34)

## 2015-07-31 LAB — VITAMIN B1: Vitamin B1 (Thiamine): 14 nmol/L (ref 8–30)

## 2015-07-31 LAB — DHEA: DHEA: 82 ng/dL — ABNORMAL LOW (ref 102–1185)

## 2015-07-31 LAB — ZINC: Zinc: 73 ug/dL (ref 60–130)

## 2015-08-01 LAB — VITAMIN A: Vitamin A (Retinoic Acid): 61 ug/dL (ref 38–98)

## 2015-08-03 ENCOUNTER — Other Ambulatory Visit (INDEPENDENT_AMBULATORY_CARE_PROVIDER_SITE_OTHER): Payer: PPO

## 2015-08-03 ENCOUNTER — Telehealth: Payer: Self-pay | Admitting: Family Medicine

## 2015-08-03 ENCOUNTER — Encounter: Payer: Self-pay | Admitting: Family Medicine

## 2015-08-03 DIAGNOSIS — I10 Essential (primary) hypertension: Secondary | ICD-10-CM | POA: Diagnosis not present

## 2015-08-03 DIAGNOSIS — Z0000 Encounter for general adult medical examination without abnormal findings: Secondary | ICD-10-CM

## 2015-08-03 LAB — VITAMIN B6: Vitamin B6: 31.2 ng/mL — ABNORMAL HIGH (ref 2.1–21.7)

## 2015-08-03 LAB — BASIC METABOLIC PANEL
BUN: 26 mg/dL — ABNORMAL HIGH (ref 6–23)
CO2: 26 mEq/L (ref 19–32)
Calcium: 9.9 mg/dL (ref 8.4–10.5)
Chloride: 88 mEq/L — ABNORMAL LOW (ref 96–112)
Creatinine, Ser: 1.04 mg/dL (ref 0.40–1.20)
GFR: 56.02 mL/min — ABNORMAL LOW (ref >60.00)
Glucose, Bld: 96 mg/dL (ref 70–99)
Potassium: 3.9 mEq/L (ref 3.5–5.1)
Sodium: 124 mEq/L — ABNORMAL LOW (ref 135–145)

## 2015-08-03 NOTE — Telephone Encounter (Signed)
Patient stopped by for labs. BP was checked off hctz and valsartan and was 130/78. Remain off BP medications

## 2015-08-06 ENCOUNTER — Other Ambulatory Visit: Payer: Self-pay

## 2015-08-06 DIAGNOSIS — E871 Hypo-osmolality and hyponatremia: Secondary | ICD-10-CM

## 2015-08-09 ENCOUNTER — Other Ambulatory Visit (INDEPENDENT_AMBULATORY_CARE_PROVIDER_SITE_OTHER): Payer: PPO

## 2015-08-09 DIAGNOSIS — E871 Hypo-osmolality and hyponatremia: Secondary | ICD-10-CM

## 2015-08-09 LAB — BASIC METABOLIC PANEL
BUN: 27 mg/dL — ABNORMAL HIGH (ref 6–23)
CO2: 24 mEq/L (ref 19–32)
Calcium: 10.2 mg/dL (ref 8.4–10.5)
Chloride: 99 mEq/L (ref 96–112)
Creatinine, Ser: 0.98 mg/dL (ref 0.40–1.20)
GFR: 60 mL/min — ABNORMAL LOW (ref >60.00)
Glucose, Bld: 100 mg/dL — ABNORMAL HIGH (ref 70–99)
Potassium: 4.6 mEq/L (ref 3.5–5.1)
Sodium: 133 mEq/L — ABNORMAL LOW (ref 135–145)

## 2015-08-29 ENCOUNTER — Other Ambulatory Visit: Payer: Self-pay | Admitting: Family Medicine

## 2015-09-11 ENCOUNTER — Ambulatory Visit (INDEPENDENT_AMBULATORY_CARE_PROVIDER_SITE_OTHER): Payer: PPO | Admitting: Family Medicine

## 2015-09-11 ENCOUNTER — Encounter: Payer: Self-pay | Admitting: Family Medicine

## 2015-09-11 VITALS — BP 104/66 | HR 56 | Temp 97.5°F | Wt 199.4 lb

## 2015-09-11 DIAGNOSIS — I10 Essential (primary) hypertension: Secondary | ICD-10-CM | POA: Diagnosis not present

## 2015-09-11 DIAGNOSIS — E119 Type 2 diabetes mellitus without complications: Secondary | ICD-10-CM

## 2015-09-11 DIAGNOSIS — K912 Postsurgical malabsorption, not elsewhere classified: Secondary | ICD-10-CM | POA: Diagnosis not present

## 2015-09-11 DIAGNOSIS — E785 Hyperlipidemia, unspecified: Secondary | ICD-10-CM | POA: Diagnosis not present

## 2015-09-11 LAB — BASIC METABOLIC PANEL
BUN: 29 mg/dL — ABNORMAL HIGH (ref 6–23)
CO2: 24 mEq/L (ref 19–32)
Calcium: 10.2 mg/dL (ref 8.4–10.5)
Chloride: 105 mEq/L (ref 96–112)
Creatinine, Ser: 1.03 mg/dL (ref 0.40–1.20)
GFR: 56.63 mL/min — ABNORMAL LOW (ref >60.00)
Glucose, Bld: 121 mg/dL — ABNORMAL HIGH (ref 70–99)
Potassium: 4.6 mEq/L (ref 3.5–5.1)
Sodium: 137 mEq/L (ref 135–145)

## 2015-09-11 LAB — HEMOGLOBIN A1C: Hgb A1c MFr Bld: 5.6 % (ref 4.6–6.5)

## 2015-09-11 LAB — VITAMIN D 25 HYDROXY (VIT D DEFICIENCY, FRACTURES): VITD: 50.53 ng/mL (ref 30.00–100.00)

## 2015-09-11 LAB — LDL CHOLESTEROL, DIRECT: Direct LDL: 86 mg/dL

## 2015-09-11 NOTE — Patient Instructions (Addendum)
Labs before you leave. Hopeful kidney function is better

## 2015-09-11 NOTE — Progress Notes (Signed)
Subjective:  Tricia [name] is a 70 y.o. year old very pleasant female patient who presents for/with See problem oriented charting ROS- No chest pain or shortness of breath. No headache or blurry vision..see any ROS included in HPI as well.   Past Medical History-  Patient Active Problem List   Diagnosis Date Noted  . Second hand smoke exposure 07/28/2015    Priority: High  . History of gastric bypass 02/14/2014    Priority: High  . CAD (coronary artery disease) 09/22/2006    Priority: High  . Former smoker 02/14/2014    Priority: Medium  . Diabetes mellitus type II, controlled ([place]) 02/14/2014    Priority: Medium  . Anxiety state 10/03/2009    Priority: Medium  . ANEMIA, VITAMIN B12 DEFICIENCY 04/04/2009    Priority: Medium  . Hyperlipidemia 09/22/2006    Priority: Medium  . Essential hypertension 09/22/2006    Priority: Medium  . Low back pain 09/22/2006    Priority: Medium  . GERD (gastroesophageal reflux disease) 02/14/2014    Priority: Low  . Osteopenia 12/14/2006    Priority: Low  . Osteoarthritis 09/22/2006    Priority: Low  . Postoperative malabsorption 07/27/2015  . Recurrent UTI 03/14/2014    Medications- reviewed and updated Current Outpatient Prescriptions  Medication Sig Dispense Refill  . aspirin 81 MG tablet Take 81 mg by mouth daily.      Marland Kitchen atorvastatin (LIPITOR) 20 MG tablet Take 1 tablet (20 mg total) by mouth daily. 90 tablet 3  . Biotin 10000 MCG TABS Take 1 tablet by mouth.    . blood glucose meter kit and supplies KIT Test once daily for glucose control 1 each 0  . glucose blood test strip Use as instructed 100 each 12  . HYDROcodone-acetaminophen (NORCO/VICODIN) 5-325 MG tablet Take 1 tablet by mouth every 12 (twelve) hours as needed for moderate pain. 30 tablet 0  . isosorbide mononitrate (IMDUR) 60 MG 24 hr tablet TAKE 1 TABLET (60 MG TOTAL) BY MOUTH DAILY. 30 tablet 5  . metoprolol (LOPRESSOR) 50 MG tablet Take 1 tablet (50 mg total) by mouth 2  (two) times daily. 180 tablet 3  . nitroGLYCERIN (NITROLINGUAL) 0.4 MG/SPRAY spray Place 1 spray under the tongue every 5 (five) minutes as needed. 12 g 3  . omeprazole (PRILOSEC) 40 MG capsule TAKE 1 CAPSULE (40 MG TOTAL) BY MOUTH DAILY. 30 capsule 5  . tizanidine (ZANAFLEX) 2 MG capsule Take 1 capsule (2 mg total) by mouth 3 (three) times daily as needed for muscle spasms. 60 capsule 2  . valsartan (DIOVAN) 320 MG tablet Take 1 tablet (320 mg total) by mouth daily. 30 tablet 11  . VENTOLIN HFA 108 (90 BASE) MCG/ACT inhaler INHALE TWO PUFFS BY MOUTH EVERY SIX HOURS as needed for wheezing 18 g 0   No current facility-administered medications for this visit.     Objective: BP 104/66 (BP Location: Left Arm, Patient Position: Sitting, Cuff Size: Large)   Pulse (!) 56   Temp 97.5 F (36.4 C) (Oral)   Wt 199 lb 6.4 oz (90.4 kg)   SpO2 96%   BMI 36.47 kg/m  Gen: NAD, resting comfortably CV: RRR no murmurs rubs or gallops Lungs: CTAB no crackles, wheeze, rhonchi Ext: no edema Skin: warm, dry  Assessment/Plan:  AKI/hypokalemia S: at physical- BUN up to 64 and Cr up to 1.38 so we held hctz and valsartan with plan for repeat 1 week. Repeat a week later was 1.04 creatinine and BUN  of 26. She had some hyponatreamia from increased fluids without salt intake increase. Repeat a week later continued to improve with sodium 133, BUN 27, creatinine 0.98 A/P: update BMP today- back on valsartan and BP lower end but spiked off of it (may have to try 80 or 142m dose)  Postoperative malabsorption S: had some lab abnormalities at annual visit- needed follow up. Started vitamin D. Stopped higher dose of b6 as had been elevated.  A/P: - Plan: VITAMIN D 25 Hydroxy (Vit-D Deficiency, Fractures), PTH, Intact and Calcium, DHEA, Copper, serum, Copper, Serum  Hyperlipidemia S: suspect improved controlled on Pravastatin---> atorv 20 with goal LDL <100. No myalgias.  Lab Results  Component Value Date   CHOL  162 06/08/2015   HDL 37.80 (L) 06/08/2015   LDLCALC 87 06/08/2015   LDLDIRECT 160.7 09/20/2012   TRIG 186.0 (H) 06/08/2015   CHOLHDL 4 06/08/2015   A/P: update direct ldl today   Diabetes mellitus type II, controlled S: controlled without rx with history gastric bypass Lab Results  Component Value Date   HGBA1C 6.6 (H) 06/08/2015  A/P: update a1c today, doubt will need meds unless above 7.   The duration of face-to-face time during this visit was 25 minutes. Greater than 50% of this time was spent in counseling, explanation of diagnosis, planning of further management, and/or coordination of care.   Orders Placed This Encounter  Procedures  . LDL cholesterol, direct    [place]  . VITAMIN D 25 Hydroxy (Vit-D Deficiency, Fractures)    [place]  . PTH, Intact and Calcium  . DHEA  . Copper, serum  . Basic metabolic panel    [place]  . Hemoglobin A1c    [place]  . Copper, Serum    Meds ordered this encounter  Medications  . Biotin 10000 MCG TABS    Sig: Take 1 tablet by mouth.    Return precautions advised.  SGarret Reddish MD

## 2015-09-11 NOTE — Progress Notes (Signed)
Pre visit review using our clinic review tool, if applicable. No additional management support is needed unless otherwise documented below in the visit note. 

## 2015-09-11 NOTE — Assessment & Plan Note (Signed)
S: suspect improved controlled on Pravastatin---> atorv 20 with goal LDL <100. No myalgias.  Lab Results  Component Value Date   CHOL 162 06/08/2015   HDL 37.80 (L) 06/08/2015   LDLCALC 87 06/08/2015   LDLDIRECT 160.7 09/20/2012   TRIG 186.0 (H) 06/08/2015   CHOLHDL 4 06/08/2015   A/P: update direct ldl today

## 2015-09-11 NOTE — Assessment & Plan Note (Signed)
S: controlled without rx with history gastric bypass Lab Results  Component Value Date   HGBA1C 6.6 (H) 06/08/2015  A/P: update a1c today, doubt will need meds unless above 7.

## 2015-09-12 LAB — PTH, INTACT AND CALCIUM
Calcium: 9.9 mg/dL (ref 8.4–10.5)
PTH: 98 pg/mL — ABNORMAL HIGH (ref 14–64)

## 2015-09-14 LAB — DHEA

## 2015-10-01 ENCOUNTER — Inpatient Hospital Stay (HOSPITAL_COMMUNITY): Payer: PPO

## 2015-10-01 ENCOUNTER — Encounter (HOSPITAL_COMMUNITY)
Admission: EM | Disposition: A | Discharge status: Home Health | Payer: Self-pay | Source: Home / Self Care | Attending: Cardiology

## 2015-10-01 ENCOUNTER — Encounter (HOSPITAL_COMMUNITY): Payer: Self-pay | Admitting: Cardiovascular Disease

## 2015-10-01 ENCOUNTER — Inpatient Hospital Stay (HOSPITAL_COMMUNITY)
Admission: EM | Admit: 2015-10-01 | Discharge: 2015-10-11 | DRG: 246 | Disposition: A | Discharge status: Home Health | Payer: PPO | Attending: Cardiology | Admitting: Cardiology

## 2015-10-01 ENCOUNTER — Emergency Department (HOSPITAL_COMMUNITY): Payer: PPO

## 2015-10-01 DIAGNOSIS — I119 Hypertensive heart disease without heart failure: Secondary | ICD-10-CM | POA: Diagnosis present

## 2015-10-01 DIAGNOSIS — I2510 Atherosclerotic heart disease of native coronary artery without angina pectoris: Secondary | ICD-10-CM

## 2015-10-01 DIAGNOSIS — S2220XA Unspecified fracture of sternum, initial encounter for closed fracture: Secondary | ICD-10-CM | POA: Diagnosis not present

## 2015-10-01 DIAGNOSIS — I10 Essential (primary) hypertension: Secondary | ICD-10-CM | POA: Diagnosis present

## 2015-10-01 DIAGNOSIS — E785 Hyperlipidemia, unspecified: Secondary | ICD-10-CM | POA: Diagnosis present

## 2015-10-01 DIAGNOSIS — X58XXXA Exposure to other specified factors, initial encounter: Secondary | ICD-10-CM | POA: Diagnosis not present

## 2015-10-01 DIAGNOSIS — M1990 Unspecified osteoarthritis, unspecified site: Secondary | ICD-10-CM | POA: Diagnosis not present

## 2015-10-01 DIAGNOSIS — Z96651 Presence of right artificial knee joint: Secondary | ICD-10-CM | POA: Diagnosis not present

## 2015-10-01 DIAGNOSIS — N390 Urinary tract infection, site not specified: Secondary | ICD-10-CM | POA: Diagnosis not present

## 2015-10-01 DIAGNOSIS — N181 Chronic kidney disease, stage 1: Secondary | ICD-10-CM

## 2015-10-01 DIAGNOSIS — Z9884 Bariatric surgery status: Secondary | ICD-10-CM | POA: Diagnosis not present

## 2015-10-01 DIAGNOSIS — Z87891 Personal history of nicotine dependence: Secondary | ICD-10-CM | POA: Diagnosis not present

## 2015-10-01 DIAGNOSIS — S2243XA Multiple fractures of ribs, bilateral, initial encounter for closed fracture: Secondary | ICD-10-CM | POA: Diagnosis present

## 2015-10-01 DIAGNOSIS — I25110 Atherosclerotic heart disease of native coronary artery with unstable angina pectoris: Secondary | ICD-10-CM | POA: Diagnosis not present

## 2015-10-01 DIAGNOSIS — M810 Age-related osteoporosis without current pathological fracture: Secondary | ICD-10-CM | POA: Diagnosis not present

## 2015-10-01 DIAGNOSIS — Z7982 Long term (current) use of aspirin: Secondary | ICD-10-CM | POA: Diagnosis not present

## 2015-10-01 DIAGNOSIS — E1159 Type 2 diabetes mellitus with other circulatory complications: Secondary | ICD-10-CM | POA: Diagnosis present

## 2015-10-01 DIAGNOSIS — Z79899 Other long term (current) drug therapy: Secondary | ICD-10-CM

## 2015-10-01 DIAGNOSIS — Z6835 Body mass index (BMI) 35.0-35.9, adult: Secondary | ICD-10-CM

## 2015-10-01 DIAGNOSIS — E1122 Type 2 diabetes mellitus with diabetic chronic kidney disease: Secondary | ICD-10-CM

## 2015-10-01 DIAGNOSIS — I152 Hypertension secondary to endocrine disorders: Secondary | ICD-10-CM | POA: Diagnosis present

## 2015-10-01 DIAGNOSIS — I252 Old myocardial infarction: Secondary | ICD-10-CM | POA: Diagnosis present

## 2015-10-01 DIAGNOSIS — R0781 Pleurodynia: Secondary | ICD-10-CM | POA: Diagnosis present

## 2015-10-01 DIAGNOSIS — R109 Unspecified abdominal pain: Secondary | ICD-10-CM

## 2015-10-01 DIAGNOSIS — E119 Type 2 diabetes mellitus without complications: Secondary | ICD-10-CM

## 2015-10-01 DIAGNOSIS — K5900 Constipation, unspecified: Secondary | ICD-10-CM | POA: Diagnosis not present

## 2015-10-01 DIAGNOSIS — I4901 Ventricular fibrillation: Secondary | ICD-10-CM | POA: Diagnosis not present

## 2015-10-01 DIAGNOSIS — I2119 ST elevation (STEMI) myocardial infarction involving other coronary artery of inferior wall: Secondary | ICD-10-CM | POA: Diagnosis not present

## 2015-10-01 DIAGNOSIS — F419 Anxiety disorder, unspecified: Secondary | ICD-10-CM | POA: Diagnosis present

## 2015-10-01 DIAGNOSIS — G4730 Sleep apnea, unspecified: Secondary | ICD-10-CM | POA: Diagnosis not present

## 2015-10-01 DIAGNOSIS — I213 ST elevation (STEMI) myocardial infarction of unspecified site: Secondary | ICD-10-CM

## 2015-10-01 DIAGNOSIS — I959 Hypotension, unspecified: Secondary | ICD-10-CM | POA: Diagnosis present

## 2015-10-01 DIAGNOSIS — Z9861 Coronary angioplasty status: Secondary | ICD-10-CM

## 2015-10-01 DIAGNOSIS — B9620 Unspecified Escherichia coli [E. coli] as the cause of diseases classified elsewhere: Secondary | ICD-10-CM | POA: Diagnosis not present

## 2015-10-01 DIAGNOSIS — R079 Chest pain, unspecified: Secondary | ICD-10-CM | POA: Diagnosis not present

## 2015-10-01 DIAGNOSIS — F329 Major depressive disorder, single episode, unspecified: Secondary | ICD-10-CM | POA: Diagnosis not present

## 2015-10-01 DIAGNOSIS — E11649 Type 2 diabetes mellitus with hypoglycemia without coma: Secondary | ICD-10-CM | POA: Diagnosis not present

## 2015-10-01 DIAGNOSIS — R0789 Other chest pain: Secondary | ICD-10-CM

## 2015-10-01 DIAGNOSIS — I2111 ST elevation (STEMI) myocardial infarction involving right coronary artery: Secondary | ICD-10-CM

## 2015-10-01 DIAGNOSIS — E1169 Type 2 diabetes mellitus with other specified complication: Secondary | ICD-10-CM | POA: Diagnosis present

## 2015-10-01 DIAGNOSIS — Z8674 Personal history of sudden cardiac arrest: Secondary | ICD-10-CM | POA: Diagnosis present

## 2015-10-01 DIAGNOSIS — I469 Cardiac arrest, cause unspecified: Secondary | ICD-10-CM | POA: Diagnosis not present

## 2015-10-01 DIAGNOSIS — Z955 Presence of coronary angioplasty implant and graft: Secondary | ICD-10-CM | POA: Diagnosis not present

## 2015-10-01 HISTORY — DX: Personal history of other medical treatment: Z92.89

## 2015-10-01 HISTORY — DX: Ventricular fibrillation: I49.01

## 2015-10-01 HISTORY — DX: Morbid (severe) obesity due to excess calories: E66.01

## 2015-10-01 HISTORY — DX: Hypertensive heart disease without heart failure: I11.9

## 2015-10-01 HISTORY — PX: CARDIAC CATHETERIZATION: SHX172

## 2015-10-01 LAB — LIPID PANEL
Cholesterol: 124 mg/dL (ref 0–200)
HDL: 29 mg/dL — ABNORMAL LOW (ref >40)
LDL Cholesterol: 64 mg/dL (ref 0–99)
Total CHOL/HDL Ratio: 4.3 RATIO
Triglycerides: 157 mg/dL — ABNORMAL HIGH (ref <150)
VLDL: 31 mg/dL (ref 0–40)

## 2015-10-01 LAB — CBC
HCT: 46.1 % — ABNORMAL HIGH (ref 36.0–46.0)
Hemoglobin: 15.6 g/dL — ABNORMAL HIGH (ref 12.0–15.0)
MCH: 29.5 pg (ref 26.0–34.0)
MCHC: 33.8 g/dL (ref 30.0–36.0)
MCV: 87.3 fL (ref 78.0–100.0)
Platelets: 237 10*3/uL (ref 150–400)
RBC: 5.28 MIL/uL — ABNORMAL HIGH (ref 3.87–5.11)
RDW: 13.5 % (ref 11.5–15.5)
WBC: 15.3 10*3/uL — ABNORMAL HIGH (ref 4.0–10.5)

## 2015-10-01 LAB — MAGNESIUM: Magnesium: 1.9 mg/dL (ref 1.7–2.4)

## 2015-10-01 LAB — POCT I-STAT, CHEM 8
BUN: 33 mg/dL — ABNORMAL HIGH (ref 6–20)
Calcium, Ion: 1.18 mmol/L (ref 1.12–1.23)
Chloride: 108 mmol/L (ref 101–111)
Creatinine, Ser: 0.9 mg/dL (ref 0.44–1.00)
Glucose, Bld: 221 mg/dL — ABNORMAL HIGH (ref 65–99)
HCT: 38 % (ref 36.0–46.0)
Hemoglobin: 12.9 g/dL (ref 12.0–15.0)
Potassium: 3.5 mmol/L (ref 3.5–5.1)
Sodium: 142 mmol/L (ref 135–145)
TCO2: 19 mmol/L (ref 0–100)

## 2015-10-01 LAB — COMPREHENSIVE METABOLIC PANEL
ALT: 285 U/L — ABNORMAL HIGH (ref 14–54)
AST: 242 U/L — ABNORMAL HIGH (ref 15–41)
Albumin: 3.8 g/dL (ref 3.5–5.0)
Alkaline Phosphatase: 126 U/L (ref 38–126)
Anion gap: 15 (ref 5–15)
BUN: 31 mg/dL — ABNORMAL HIGH (ref 6–20)
CO2: 17 mmol/L — ABNORMAL LOW (ref 22–32)
Calcium: 9.8 mg/dL (ref 8.9–10.3)
Chloride: 107 mmol/L (ref 101–111)
Creatinine, Ser: 1.24 mg/dL — ABNORMAL HIGH (ref 0.44–1.00)
GFR calc Af Amer: 51 mL/min — ABNORMAL LOW (ref >60)
GFR calc non Af Amer: 44 mL/min — ABNORMAL LOW (ref >60)
Glucose, Bld: 216 mg/dL — ABNORMAL HIGH (ref 65–99)
Potassium: 3.4 mmol/L — ABNORMAL LOW (ref 3.5–5.1)
Sodium: 139 mmol/L (ref 135–145)
Total Bilirubin: 0.7 mg/dL (ref 0.3–1.2)
Total Protein: 6.5 g/dL (ref 6.5–8.1)

## 2015-10-01 LAB — DIFFERENTIAL
Basophils Absolute: 0.1 10*3/uL (ref 0.0–0.1)
Basophils Relative: 0 %
Eosinophils Absolute: 0.1 10*3/uL (ref 0.0–0.7)
Eosinophils Relative: 1 %
Lymphocytes Relative: 19 %
Lymphs Abs: 3 10*3/uL (ref 0.7–4.0)
Monocytes Absolute: 0.7 10*3/uL (ref 0.1–1.0)
Monocytes Relative: 5 %
Neutro Abs: 11.5 10*3/uL — ABNORMAL HIGH (ref 1.7–7.7)
Neutrophils Relative %: 75 %

## 2015-10-01 LAB — APTT: aPTT: 30 seconds (ref 24–36)

## 2015-10-01 LAB — PROTIME-INR
INR: 1.08
Prothrombin Time: 14 seconds (ref 11.4–15.2)

## 2015-10-01 LAB — TROPONIN I
Troponin I: <0.03 ng/mL (ref <0.03)
Troponin I: 19.27 ng/mL (ref <0.03)

## 2015-10-01 LAB — POCT ACTIVATED CLOTTING TIME: Activated Clotting Time: 665 seconds

## 2015-10-01 LAB — MRSA PCR SCREENING: MRSA by PCR: NEGATIVE

## 2015-10-01 SURGERY — LEFT HEART CATH AND CORONARY ANGIOGRAPHY

## 2015-10-01 MED ORDER — ASPIRIN 81 MG PO CHEW
324.0000 mg | CHEWABLE_TABLET | Freq: Once | ORAL | Status: AC
  Administered 2015-10-01: 324 mg via ORAL
  Filled 2015-10-01: qty 4

## 2015-10-01 MED ORDER — ATROPINE SULFATE 1 MG/10ML IJ SOSY
PREFILLED_SYRINGE | INTRAMUSCULAR | Status: AC
  Filled 2015-10-01: qty 10

## 2015-10-01 MED ORDER — HEPARIN (PORCINE) IN NACL 2-0.9 UNIT/ML-% IJ SOLN
INTRAMUSCULAR | Status: AC
  Filled 2015-10-01: qty 1000

## 2015-10-01 MED ORDER — LIDOCAINE HCL (PF) 1 % IJ SOLN
INTRAMUSCULAR | Status: AC
  Filled 2015-10-01: qty 30

## 2015-10-01 MED ORDER — CEFAZOLIN IN D5W 1 GM/50ML IV SOLN
INTRAVENOUS | Status: AC
  Filled 2015-10-01: qty 50

## 2015-10-01 MED ORDER — TICAGRELOR 90 MG PO TABS
ORAL_TABLET | ORAL | Status: DC | PRN
  Administered 2015-10-01: 180 mg via ORAL

## 2015-10-01 MED ORDER — BIVALIRUDIN BOLUS VIA INFUSION - CUPID
INTRAVENOUS | Status: DC | PRN
  Administered 2015-10-01: 66.375 mg via INTRAVENOUS

## 2015-10-01 MED ORDER — HEPARIN SODIUM (PORCINE) 5000 UNIT/ML IJ SOLN
4000.0000 [IU] | INTRAMUSCULAR | Status: AC
  Administered 2015-10-01: 4000 [IU] via INTRAVENOUS

## 2015-10-01 MED ORDER — IOPAMIDOL (ISOVUE-370) INJECTION 76%
INTRAVENOUS | Status: AC
  Filled 2015-10-01: qty 125

## 2015-10-01 MED ORDER — ONDANSETRON HCL 4 MG/2ML IJ SOLN
INTRAMUSCULAR | Status: AC
  Administered 2015-10-01: 4 mg
  Filled 2015-10-01: qty 2

## 2015-10-01 MED ORDER — ASPIRIN 81 MG PO CHEW
81.0000 mg | CHEWABLE_TABLET | Freq: Every day | ORAL | Status: DC
  Administered 2015-10-02 – 2015-10-11 (×10): 81 mg via ORAL
  Filled 2015-10-01 (×10): qty 1

## 2015-10-01 MED ORDER — SODIUM CHLORIDE 0.9 % IV SOLN
INTRAVENOUS | Status: DC | PRN
  Administered 2015-10-01: 250 mL via INTRAVENOUS

## 2015-10-01 MED ORDER — DOPAMINE-DEXTROSE 3.2-5 MG/ML-% IV SOLN
INTRAVENOUS | Status: DC | PRN
  Administered 2015-10-01: 5 ug/kg/min via INTRAVENOUS

## 2015-10-01 MED ORDER — HEPARIN SODIUM (PORCINE) 1000 UNIT/ML IJ SOLN
INTRAMUSCULAR | Status: AC
  Filled 2015-10-01: qty 1

## 2015-10-01 MED ORDER — SODIUM CHLORIDE 0.9% FLUSH: 3.0000 mL | INTRAVENOUS | Status: DC | PRN

## 2015-10-01 MED ORDER — VERAPAMIL HCL 2.5 MG/ML IV SOLN
INTRAVENOUS | Status: DC | PRN
  Administered 2015-10-01: 10 mL via INTRA_ARTERIAL

## 2015-10-01 MED ORDER — ACETAMINOPHEN 325 MG PO TABS
650.0000 mg | ORAL_TABLET | ORAL | Status: DC | PRN
  Administered 2015-10-02: 650 mg via ORAL
  Filled 2015-10-01: qty 2

## 2015-10-01 MED ORDER — HEPARIN (PORCINE) IN NACL 2-0.9 UNIT/ML-% IJ SOLN
INTRAMUSCULAR | Status: DC | PRN
  Administered 2015-10-01: 500 mL
  Administered 2015-10-01: 1000 mL

## 2015-10-01 MED ORDER — SODIUM CHLORIDE 0.9 % IV SOLN: 250.0000 mL | INTRAVENOUS | Status: DC | PRN

## 2015-10-01 MED ORDER — ISOSORBIDE MONONITRATE ER 60 MG PO TB24: 60.0000 mg | ORAL_TABLET | Freq: Every day | ORAL | Status: DC

## 2015-10-01 MED ORDER — LIDOCAINE HCL (PF) 1 % IJ SOLN
INTRAMUSCULAR | Status: DC | PRN
  Administered 2015-10-01: 15 mL via INTRADERMAL

## 2015-10-01 MED ORDER — ALBUTEROL SULFATE (2.5 MG/3ML) 0.083% IN NEBU: 3.0000 mL | INHALATION_SOLUTION | Freq: Four times a day (QID) | RESPIRATORY_TRACT | Status: DC | PRN

## 2015-10-01 MED ORDER — DOPAMINE-DEXTROSE 3.2-5 MG/ML-% IV SOLN
INTRAVENOUS | Status: AC
  Filled 2015-10-01: qty 250

## 2015-10-01 MED ORDER — TICAGRELOR 90 MG PO TABS
ORAL_TABLET | ORAL | Status: AC
  Filled 2015-10-01: qty 1

## 2015-10-01 MED ORDER — IOPAMIDOL (ISOVUE-370) INJECTION 76%
INTRAVENOUS | Status: AC
  Filled 2015-10-01: qty 50

## 2015-10-01 MED ORDER — TIZANIDINE HCL 2 MG PO TABS
2.0000 mg | ORAL_TABLET | Freq: Three times a day (TID) | ORAL | Status: DC | PRN
  Administered 2015-10-02: 2 mg via ORAL
  Administered 2015-10-02: 4 mg via ORAL
  Administered 2015-10-02: 2 mg via ORAL
  Filled 2015-10-01 (×8): qty 1

## 2015-10-01 MED ORDER — FENTANYL CITRATE (PF) 100 MCG/2ML IJ SOLN
INTRAMUSCULAR | Status: AC
  Filled 2015-10-01: qty 2

## 2015-10-01 MED ORDER — MIDAZOLAM HCL 5 MG/5ML IJ SOLN
INTRAMUSCULAR | Status: AC
  Filled 2015-10-01: qty 5

## 2015-10-01 MED ORDER — ONDANSETRON HCL 4 MG/2ML IJ SOLN: 4.0000 mg | Freq: Four times a day (QID) | INTRAMUSCULAR | Status: DC | PRN

## 2015-10-01 MED ORDER — BIVALIRUDIN 250 MG IV SOLR
INTRAVENOUS | Status: AC
  Filled 2015-10-01: qty 250

## 2015-10-01 MED ORDER — SODIUM CHLORIDE 0.9 % WEIGHT BASED INFUSION
3.0000 mL/kg/h | INTRAVENOUS | Status: AC
  Administered 2015-10-01: 3 mL/kg/h via INTRAVENOUS

## 2015-10-01 MED ORDER — SODIUM CHLORIDE 0.9 % IV SOLN
INTRAVENOUS | Status: DC | PRN
  Administered 2015-10-01: 1.75 mg/kg/h via INTRAVENOUS

## 2015-10-01 MED ORDER — MIDAZOLAM HCL 2 MG/2ML IJ SOLN
INTRAMUSCULAR | Status: AC
  Filled 2015-10-01: qty 2

## 2015-10-01 MED ORDER — TICAGRELOR 90 MG PO TABS
90.0000 mg | ORAL_TABLET | Freq: Two times a day (BID) | ORAL | Status: DC
  Administered 2015-10-02 – 2015-10-05 (×7): 90 mg via ORAL
  Filled 2015-10-01 (×7): qty 1

## 2015-10-01 MED ORDER — HEPARIN SODIUM (PORCINE) 5000 UNIT/ML IJ SOLN
INTRAMUSCULAR | Status: AC
Start: 2015-10-01 — End: 2015-10-02
  Filled 2015-10-01: qty 1

## 2015-10-01 MED ORDER — SODIUM CHLORIDE 0.9 % IV SOLN
10.0000 mL/h | INTRAVENOUS | Status: DC
  Administered 2015-10-01: 10 mL/h via INTRAVENOUS

## 2015-10-01 MED ORDER — VERAPAMIL HCL 2.5 MG/ML IV SOLN
INTRAVENOUS | Status: AC
  Filled 2015-10-01: qty 2

## 2015-10-01 MED ORDER — CEFAZOLIN IN D5W 1 GM/50ML IV SOLN
INTRAVENOUS | Status: DC | PRN
  Administered 2015-10-01: 1 g via INTRAVENOUS

## 2015-10-01 MED ORDER — NITROGLYCERIN 1 MG/10 ML FOR IR/CATH LAB
INTRA_ARTERIAL | Status: AC
  Filled 2015-10-01: qty 10

## 2015-10-01 MED ORDER — PANTOPRAZOLE SODIUM 40 MG PO TBEC
40.0000 mg | DELAYED_RELEASE_TABLET | Freq: Every day | ORAL | Status: DC
  Administered 2015-10-02 – 2015-10-11 (×9): 40 mg via ORAL
  Filled 2015-10-01 (×9): qty 1

## 2015-10-01 MED ORDER — HYDROCODONE-ACETAMINOPHEN 5-325 MG PO TABS
1.0000 | ORAL_TABLET | Freq: Two times a day (BID) | ORAL | Status: DC | PRN
  Administered 2015-10-01 – 2015-10-03 (×3): 1 via ORAL
  Filled 2015-10-01 (×4): qty 1

## 2015-10-01 MED ORDER — MIDAZOLAM HCL 2 MG/2ML IJ SOLN
INTRAMUSCULAR | Status: DC | PRN
  Administered 2015-10-01: 0.5 mg via INTRAVENOUS
  Administered 2015-10-01: 1 mg via INTRAVENOUS
  Administered 2015-10-01: 0.5 mg via INTRAVENOUS

## 2015-10-01 MED ORDER — MORPHINE SULFATE (PF) 2 MG/ML IV SOLN
2.0000 mg | INTRAVENOUS | Status: DC | PRN
  Administered 2015-10-01 – 2015-10-11 (×12): 2 mg via INTRAVENOUS
  Filled 2015-10-01 (×14): qty 1

## 2015-10-01 MED ORDER — SODIUM CHLORIDE 0.9% FLUSH
3.0000 mL | Freq: Two times a day (BID) | INTRAVENOUS | Status: DC
  Administered 2015-10-01 – 2015-10-10 (×15): 3 mL via INTRAVENOUS

## 2015-10-01 MED ORDER — FENTANYL CITRATE (PF) 100 MCG/2ML IJ SOLN
INTRAMUSCULAR | Status: DC | PRN
  Administered 2015-10-01 (×2): 25 ug via INTRAVENOUS
  Administered 2015-10-01: 12.5 ug via INTRAVENOUS
  Administered 2015-10-01: 37.5 ug via INTRAVENOUS

## 2015-10-01 MED ORDER — ATORVASTATIN CALCIUM 20 MG PO TABS: 20.0000 mg | ORAL_TABLET | Freq: Every day | ORAL | Status: DC

## 2015-10-01 MED ORDER — EPINEPHRINE HCL 0.1 MG/ML IJ SOSY
PREFILLED_SYRINGE | INTRAMUSCULAR | Status: AC | PRN
  Administered 2015-10-01: 1 mg via INTRAVENOUS

## 2015-10-01 SURGICAL SUPPLY — 20 items
BALLN ~~LOC~~ EMERGE MR 2.0X15 (BALLOONS) ×2
BALLN ~~LOC~~ EUPHORA RX 3.0X20 (BALLOONS) ×2
BALLOON ~~LOC~~ EMERGE MR 2.0X15 (BALLOONS) ×1 IMPLANT
BALLOON ~~LOC~~ EUPHORA RX 3.0X20 (BALLOONS) ×1 IMPLANT
CATH INFINITI 5FR MULTPACK ANG (CATHETERS) ×2 IMPLANT
CATH OPTITORQUE TIG 4.0 5F (CATHETERS) ×2 IMPLANT
DEVICE RAD COMP TR BAND LRG (VASCULAR PRODUCTS) ×2 IMPLANT
GLIDESHEATH SLEND A-KIT 6F 22G (SHEATH) ×2 IMPLANT
GUIDE CATH RUNWAY 6FR FR4 SH (CATHETERS) ×2 IMPLANT
KIT ENCORE 26 ADVANTAGE (KITS) ×2 IMPLANT
KIT HEART LEFT (KITS) ×2 IMPLANT
PACK CARDIAC CATHETERIZATION (CUSTOM PROCEDURE TRAY) ×2 IMPLANT
SHEATH PINNACLE 6F 10CM (SHEATH) ×2 IMPLANT
STENT SYNERGY DES 2.75X32 (Permanent Stent) ×2 IMPLANT
TRANSDUCER W/STOPCOCK (MISCELLANEOUS) ×2 IMPLANT
TUBING CIL FLEX 10 FLL-RA (TUBING) ×2 IMPLANT
WIRE ASAHI PROWATER 180CM (WIRE) ×2 IMPLANT
WIRE EMERALD 3MM-J .035X150CM (WIRE) ×2 IMPLANT
WIRE HI TORQ VERSACORE-J 145CM (WIRE) ×2 IMPLANT
WIRE SAFE-T 1.5MM-J .035X260CM (WIRE) ×2 IMPLANT

## 2015-10-01 NOTE — ED Provider Notes (Signed)
[facility] DEPT Provider Note   CSN: [id number] Arrival date & time: 10/01/15  1520     History   Chief Complaint No chief complaint on file.   HPI HINA GUPTA is a 70 y.o. female.  HPI  Patient is a 70 year old female with a past medical history of unstable angina type 2 diabetes, and prior ACS who comes today complaining of chest pain.  Patient begin given nitro spray for unstable angina.  Patient states that chest pain began this morning.  Patient states she took 2 nitroglycerin sprays without relief.  Patient arrived to the ED was ambulatory upon arrival.  Patient walked and said she was short of breath and had chest pain.  Patient was placed in a wheelchair and shortly lost consciousness.  Patient immediately taken to trauma a where, when placed on the monitor, patient was found to be in V. fib.  Prior to be placed on a monitor, no pulse was detected and CPR was started.  Patient did not have access to that time as such I performed an intraosseous line.  One ampule of epi given via intraosseous line.  IV established shortly thereafter.  Once patient was on the monitor, V. fib was noted on monitor, and one defibrillation was given.  Patient medially regained pulses and was alert.  A few months later patient is alert and oriented 4.Upon regaining consciousness, patient states that she no longer had chest pain or shortness of breath.  Past Medical History:  Diagnosis Date  . Adenomatous polyp of colon 02/2004  . Anemia   . Anxiety   . Arthritis   . B12 deficiency   . CAD (coronary artery disease)   . Depression   . DEPRESSION 09/22/2006   Qualifier: Diagnosis of  By: Jimmye Norman, LPN, Winfield Cunas   . Dermatophytosis of groin and perianal area   . Diabetes mellitus   . Diverticulosis   . Hyperlipidemia   . Hypertension   . Low back pain    l5 disc  . Obesity   . Osteoporosis   . Sleep apnea   . Sleep apnea 10/03/2009   Resolved after gastric bypass    . ST elevation  myocardial infarction (STEMI) of inferior wall ([place]) 10/01/2015  . TOBACCO ABUSE 10/05/2009   Qualifier: Diagnosis of  By: Stanford Breed, MD, Kandyce Rud   . Ventricular fibrillation ([place]) 10/01/2015    Patient Active Problem List   Diagnosis Date Noted  . Unstable angina ([place])   . ST elevation myocardial infarction (STEMI) of inferior wall ([place]) 10/01/2015  . Cardiac arrest with ventricular fibrillation ([place]) 10/01/2015  . ST elevation myocardial infarction (STEMI) of inferior wall, initial episode of care ([place]) 10/01/2015  . Second hand smoke exposure 07/28/2015  . Postoperative malabsorption 07/27/2015  . Recurrent UTI 03/14/2014  . GERD (gastroesophageal reflux disease) 02/14/2014  . History of gastric bypass 02/14/2014  . Former smoker 02/14/2014  . Diabetes mellitus type II, controlled ([place]) 02/14/2014  . Anxiety state 10/03/2009  . ANEMIA, VITAMIN B12 DEFICIENCY 04/04/2009  . Osteopenia 12/14/2006  . Hyperlipidemia 09/22/2006  . Essential hypertension 09/22/2006  . CAD S/P percutaneous coronary angioplasty 09/22/2006  . Osteoarthritis 09/22/2006  . Low back pain 09/22/2006    Past Surgical History:  Procedure Laterality Date  . ABDOMINAL HYSTERECTOMY    . APPENDECTOMY    . BARIATRIC SURGERY    . CARDIAC CATHETERIZATION N/A 10/01/2015   Procedure: Left Heart Cath and Coronary Angiography;  Surgeon: Leonie Man, MD;  Location: [facility];  Service: Cardiovascular;  Laterality: N/A;  . CARDIAC CATHETERIZATION N/A 10/01/2015   Procedure: Coronary Stent Intervention;  Surgeon: Leonie Man, MD;  Location: [facility];  Service: Cardiovascular;  Laterality: N/A;  . CARDIAC CATHETERIZATION N/A 10/03/2015   Procedure: Coronary Stent Intervention;  Surgeon: Troy Sine, MD;  Location: [facility];  Service: Cardiovascular;  Laterality: N/A;  . CARDIAC CATHETERIZATION N/A 10/03/2015   Procedure: Coronary/Graft Angiography;  Surgeon: Troy Sine,  MD;  Location: [facility];  Service: Cardiovascular;  Laterality: N/A;  . CARPAL TUNNEL RELEASE     bilateral  . CERVICAL DISC SURGERY    . CHOLECYSTECTOMY    . CORONARY ANGIOPLASTY  2002   2 times  . HERNIA REPAIR    . LUMBAR LAMINECTOMY     l3-4  . NEPHRECTOMY     partial  . TONSILLECTOMY    . TOTAL KNEE ARTHROPLASTY     L  . TUBAL LIGATION    . ulnar nerve release     and thumb surgery    OB History    No data available       Home Medications    Prior to Admission medications   Medication Sig Start Date End Date Taking? Authorizing Provider  aspirin EC 81 MG tablet Take 81 mg by mouth daily.   Yes Historical Provider, MD  atorvastatin (LIPITOR) 20 MG tablet Take 1 tablet (20 mg total) by mouth daily. Patient taking differently: Take 20 mg by mouth at bedtime.  06/08/15  Yes Marin Olp, MD  Biotin 10000 MCG TABS Take 10,000 mcg by mouth daily. Dissolvable tablets   Yes Historical Provider, MD  Cholecalciferol (VITAMIN D3) 2000 units CHEW Chew 2,000 Units by mouth daily.   Yes Historical Provider, MD  HYDROcodone-acetaminophen (NORCO/VICODIN) 5-325 MG tablet Take 1 tablet by mouth every 12 (twelve) hours as needed for moderate pain. 06/08/15  Yes Marin Olp, MD  isosorbide mononitrate (IMDUR) 60 MG 24 hr tablet TAKE 1 TABLET (60 MG TOTAL) BY MOUTH DAILY. 07/05/15  Yes Marin Olp, MD  metoprolol (LOPRESSOR) 50 MG tablet Take 1 tablet (50 mg total) by mouth 2 (two) times daily. Patient taking differently: Take 50 mg by mouth daily.  10/16/14  Yes Marin Olp, MD  Multiple Vitamin (MULTIVITAMIN WITH MINERALS) TABS tablet Take 1 tablet by mouth daily. Women's One a Day   Yes Historical Provider, MD  nitroGLYCERIN (NITROLINGUAL) 0.4 MG/SPRAY spray Place 1 spray under the tongue every 5 (five) minutes as needed. Patient taking differently: Place 1 spray under the tongue every 5 (five) minutes as needed for chest pain.  08/26/13  Yes Ricard Dillon, MD    omeprazole (PRILOSEC) 40 MG capsule TAKE 1 CAPSULE (40 MG TOTAL) BY MOUTH DAILY. 04/23/15  Yes Marin Olp, MD  tizanidine (ZANAFLEX) 2 MG capsule Take 1 capsule (2 mg total) by mouth 3 (three) times daily as needed for muscle spasms. Patient taking differently: Take 4 mg by mouth daily as needed for muscle spasms.  07/27/15  Yes Marin Olp, MD  valsartan (DIOVAN) 320 MG tablet Take 1 tablet (320 mg total) by mouth daily. Patient taking differently: Take 320 mg by mouth daily after breakfast.  01/01/15  Yes Marin Olp, MD  VENTOLIN HFA 108 (90 BASE) MCG/ACT inhaler INHALE TWO PUFFS BY MOUTH EVERY SIX HOURS as needed for wheezing Patient taking differently: INHALE TWO PUFFS BY  MOUTH EVERY SIX HOURS AS NEEDED FOR SHORTNESS OF BREATH/ WHEEZING FROM SEASONAL ALLERGIES 01/09/14  Yes Lucretia Kern, DO  blood glucose meter kit and supplies KIT Test once daily for glucose control 10/16/14   Marin Olp, MD  glucose blood test strip Use as instructed 10/16/14   Marin Olp, MD    Family History Family History  Problem Relation Age of Onset  . Colitis Mother     sepsis from c dif colitis  . Heart disease Father   . Coronary artery disease    . Stomach cancer Maternal Uncle     Social History Social History  Substance Use Topics  . Smoking status: Former Smoker    Packs/day: 1.50    Years: 35.00    Types: Cigarettes    Quit date: 03/06/2008  . Smokeless tobacco: Never Used  . Alcohol use No     Allergies   Sulfonamide derivatives and Sulfa antibiotics   Review of Systems Review of Systems  Unable to perform ROS: Acuity of condition     Physical Exam Updated Vital Signs BP (!) 107/57 (BP Location: Right Arm)   Pulse 61   Temp 98.8 F (37.1 C) (Oral)   Resp 16   Ht '5\' 2"'  (1.575 m)   Wt 88.5 kg   SpO2 99%   BMI 35.69 kg/m   Physical Exam  Constitutional: She appears well-developed and well-nourished. No distress.  HENT:  Head: Normocephalic and  atraumatic.  Eyes: Conjunctivae are normal.  Neck: Neck supple.  Cardiovascular: Normal rate and regular rhythm.   No murmur heard. Pulmonary/Chest: Effort normal and breath sounds normal. No respiratory distress.  Abdominal: Soft. There is no tenderness.  Musculoskeletal: She exhibits no edema.  Neurological: She is alert.  Skin: Skin is warm and dry.  As in MDM  Psychiatric: She has a normal mood and affect.  Nursing note and vitals reviewed.    ED Treatments / Results  Labs (all labs ordered are listed, but only abnormal results are displayed) Labs Reviewed  CBC - Abnormal; Notable for the following:       Result Value   WBC 15.3 (*)    RBC 5.28 (*)    Hemoglobin 15.6 (*)    HCT 46.1 (*)    All other components within normal limits  DIFFERENTIAL - Abnormal; Notable for the following:    Neutro Abs 11.5 (*)    All other components within normal limits  COMPREHENSIVE METABOLIC PANEL - Abnormal; Notable for the following:    Potassium 3.4 (*)    CO2 17 (*)    Glucose, Bld 216 (*)    BUN 31 (*)    Creatinine, Ser 1.24 (*)    AST 242 (*)    ALT 285 (*)    GFR calc non Af Amer 44 (*)    GFR calc Af Amer 51 (*)    All other components within normal limits  LIPID PANEL - Abnormal; Notable for the following:    Triglycerides 157 (*)    HDL 29 (*)    All other components within normal limits  BASIC METABOLIC PANEL - Abnormal; Notable for the following:    Glucose, Bld 107 (*)    BUN 29 (*)    Creatinine, Ser 1.05 (*)    GFR calc non Af Amer 53 (*)    All other components within normal limits  TROPONIN I - Abnormal; Notable for the following:    Troponin I 19.27 (*)  All other components within normal limits  TROPONIN I - Abnormal; Notable for the following:    Troponin I 37.91 (*)    All other components within normal limits  TROPONIN I - Abnormal; Notable for the following:    Troponin I 11.54 (*)    All other components within normal limits  TROPONIN I -  Abnormal; Notable for the following:    Troponin I 11.46 (*)    All other components within normal limits  BASIC METABOLIC PANEL - Abnormal; Notable for the following:    CO2 21 (*)    Glucose, Bld 103 (*)    BUN 22 (*)    Creatinine, Ser 1.02 (*)    GFR calc non Af Amer 55 (*)    All other components within normal limits  TROPONIN I - Abnormal; Notable for the following:    Troponin I 7.51 (*)    All other components within normal limits  BASIC METABOLIC PANEL - Abnormal; Notable for the following:    CO2 19 (*)    All other components within normal limits  CBC - Abnormal; Notable for the following:    Hemoglobin 11.9 (*)    All other components within normal limits  GLUCOSE, CAPILLARY - Abnormal; Notable for the following:    Glucose-Capillary 111 (*)    All other components within normal limits  POCT I-STAT, CHEM 8 - Abnormal; Notable for the following:    BUN 33 (*)    Glucose, Bld 221 (*)    All other components within normal limits  MRSA PCR SCREENING  PROTIME-INR  APTT  TROPONIN I  MAGNESIUM  CBC  GLUCOSE, CAPILLARY  GLUCOSE, CAPILLARY  GLUCOSE, CAPILLARY  GLUCOSE, CAPILLARY  GLUCOSE, CAPILLARY  GLUCOSE, CAPILLARY  GLUCOSE, CAPILLARY  GLUCOSE, CAPILLARY  GLUCOSE, CAPILLARY  GLUCOSE, CAPILLARY  POCT ACTIVATED CLOTTING TIME  POCT ACTIVATED CLOTTING TIME  POCT ACTIVATED CLOTTING TIME    EKG  EKG Interpretation  Date/Time:  Monday October 01 2015 15:33:31 EDT Ventricular Rate:  94 PR Interval:    QRS Duration: 91 QT Interval:  345 QTC Calculation: 432 R Axis:   55 Text Interpretation:  Sinus rhythm Ventricular premature complex Aberrant conduction of SV complex(es) Inferior infarct, acute (RCA) Probable RV involvement, suggest recording right precordial leads code stemi Confirmed by KNAPP  MD-J, JON ([id number]) on 10/01/2015 3:38:06 PM       Radiology Ct Chest Wo Contrast  Result Date: 10/04/2015 CLINICAL DATA:  Recent CPR.  Severe chest wall tenderness.  EXAM: CT CHEST WITHOUT CONTRAST TECHNIQUE: Multidetector CT imaging of the chest was performed following the standard protocol without IV contrast. COMPARISON:  Chest CT from 2005. FINDINGS: Chest wall: No breast masses or chest wall hematoma. No supraclavicular or axillary mass or adenopathy. The thyroid gland appears normal. Cardiovascular: The heart is normal in size. No pericardial effusion. Moderate atherosclerotic calcifications involving the thoracic aorta. There are three-vessel coronary artery calcifications along with an LAD stent. Mediastinum/Nodes: No mediastinal or hilar mass or adenopathy. Small scattered lymph nodes are noted. The esophagus is grossly normal. Lungs/Pleura: Biapical pleural-parenchymal scarring type changes. Patchy ground-glass opacity bilaterally but most notable in the left upper lobe. This is most likely some type of partial airspace filling process and could be edema, inflammation or early infection. Given the fact the patient had recent CPR its is probably edema. I do not see any pulmonary contusions or hematoma. No pneumothorax. Very small bilateral pleural effusions with overlying atelectasis. Upper Abdomen: Surgical changes from  gastric bypass surgery. Moderate atherosclerotic calcifications involving the abdominal aorta. The right kidney is markedly atrophic. There is some contrast in the left kidney likely from a recent contrast enhanced examination. Bilateral adrenal gland adenomas. Musculoskeletal: There is a fracture involving the anterior cortex of the the sternum. The posterior cortex is intact. No retrosternal hematoma. There are nondisplaced and minimally displaced 1 through 5 rib fractures on the right and 1 through 4 rib fractures on the left. IMPRESSION: 1. Fracture involving the anterior cortex of the sternum. The posterior cortex is intact. 2. One through 5 rib fractures on the right and 1 through 4 rib fractures on the left. 3. Patchy faint ground-glass opacity  is probable resolving pulmonary edema. 4. No worrisome pulmonary lesions or pneumothorax. 5. Extensive coronary artery calcifications and aortic calcifications. 6. Very small bilateral pleural effusions and bibasilar atelectasis. 7. 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 8. Markedly atrophic right kidney. 9. Bilateral adrenal gland adenomas. Electronically Signed   By: Marijo Sanes M.D.   On: 10/04/2015 15:43    Procedures Procedures (including critical care time)  Medications Ordered in ED Medications  0.9 %  sodium chloride infusion (0 mL/hr Intravenous Stopped 10/01/15 2200)  pantoprazole (PROTONIX) EC tablet 40 mg (40 mg Oral Given 10/04/15 0915)  albuterol (PROVENTIL) (2.5 MG/3ML) 0.083% nebulizer solution 3 mL ( Inhalation MAR Unhold 10/03/15 1038)  sodium chloride flush (NS) 0.9 % injection 3 mL (3 mLs Intravenous Not Given 10/04/15 0916)  sodium chloride flush (NS) 0.9 % injection 3 mL ( Intravenous MAR Unhold 10/03/15 1038)  0.9 %  sodium chloride infusion ( Intravenous MAR Unhold 10/03/15 1038)  acetaminophen (TYLENOL) tablet 650 mg ( Oral MAR Unhold 10/03/15 1038)  ondansetron (ZOFRAN) injection 4 mg ( Intravenous MAR Unhold 10/03/15 1038)  0.9% sodium chloride infusion (0 mL/kg/hr  88.5 kg Intravenous Stopped 10/02/15 0042)  morphine 2 MG/ML injection 2 mg (2 mg Intravenous Given 10/03/15 1607)  aspirin chewable tablet 81 mg (81 mg Oral Given 10/04/15 0915)  ticagrelor (BRILINTA) tablet 90 mg (90 mg Oral Given 10/04/15 0915)  atropine 1 MG/10ML injection (  Not Given 10/01/15 2115)  insulin aspart (novoLOG) injection 0-15 Units (0 Units Subcutaneous Not Given 10/04/15 1700)  insulin aspart (novoLOG) injection 0-5 Units (0 Units Subcutaneous Not Given 10/03/15 2200)  perflutren lipid microspheres (DEFINITY) IV suspension (2 mLs Intravenous Given 10/02/15 1608)  sodium chloride flush (NS) 0.9 % injection 3 mL (3 mLs Intravenous Given 10/04/15 0916)  sodium chloride flush (NS) 0.9 %  injection 3 mL (not administered)  0.9 %  sodium chloride infusion (not administered)  0.9 %  sodium chloride infusion ( Intravenous Stopped 10/03/15 1800)  diazepam (VALIUM) tablet 5 mg (5 mg Oral Given 10/04/15 1608)  atorvastatin (LIPITOR) tablet 80 mg (80 mg Oral Given 10/04/15 1704)  tiZANidine (ZANAFLEX) tablet 4 mg (4 mg Oral Given 10/04/15 0915)  fentaNYL (SUBLIMAZE) injection 25-50 mcg (50 mcg Intravenous Given 10/04/15 0244)  HYDROcodone-acetaminophen (NORCO/VICODIN) 5-325 MG per tablet 1-2 tablet (2 tablets Oral Given 10/04/15 1703)  carvedilol (COREG) tablet 6.25 mg (6.25 mg Oral Given 10/04/15 1607)  polyethylene glycol (MIRALAX / GLYCOLAX) packet 17 g (17 g Oral Given 10/04/15 1057)  EPINEPHrine (ADRENALIN) 0.1 MG/ML injection (1 mg Intravenous Given 10/01/15 1527)  aspirin chewable tablet 324 mg (324 mg Oral Given 10/01/15 1534)  heparin injection 4,000 Units (4,000 Units Intravenous Given 10/01/15 1547)  ondansetron (ZOFRAN) 4 MG/2ML injection (4 mg  Given 10/01/15 1534)     Initial  Impression / Assessment and Plan / ED Course  I have reviewed the triage vital signs and the nursing notes.  Pertinent labs & imaging results that were available during my care of the patient were reviewed by me and considered in my medical decision making (see chart for details).  Clinical Course   Patient is a 70 year old female with a past medical history of unstable angina type 2 diabetes, and prior ACS who comes today complaining of chest pain.  Patient begin given nitro spray for unstable angina.  Patient states that chest pain began this morning.  Patient states she took 2 nitroglycerin sprays without relief.  Patient arrived to the ED was ambulatory upon arrival.  Patient walked and said she was short of breath and had chest pain.  Patient was placed in a wheelchair and shortly lost consciousness.  Patient immediately taken to trauma a where, when placed on the monitor, patient was found to be in V.  fib.  Prior to be placed on a monitor, no pulse was detected and CPR was started.  Patient did not have access to that time as such I performed an intraosseous line.  One ampule of epi given via intraosseous line.  IV established shortly thereafter.  Once patient was on the monitor, V. fib was noted on monitor, and one defibrillation was given.  Patient medially regained pulses and was alert.  A few months later patient is alert and oriented 4.Upon regaining consciousness, patient states that she no longer had chest pain or shortness of breath.  Physical exam: Patient clear to auscultation bilaterally, normal S1-S2 no rubs murmurs gallops, abdomen soft nontender.  Patient is a small puncture wound to the medial aspect just distal to the right knee of the proximal tibia where IO line had previously been placed.  Remainder physical exam within normal limits.  Patient taken emergently to see to the Cath Lab for further evaluation and likely stenting.    Final Clinical Impressions(s) / ED Diagnoses   Final diagnoses:  ST elevation myocardial infarction (STEMI), unspecified artery [facility])  Ventricular fibrillation ([place])  Cardiac arrest ([place])  Tenderness of chest wall    New Prescriptions Current Discharge Medication List       Chapman Moss, MD 10/04/15 [place], MD 10/04/15 [place], MD 10/05/15 [phone]

## 2015-10-01 NOTE — ED Notes (Signed)
Pt wheeled into the triage room by nurse first, pt was AAOX4, talking, stated she was having chest pain and she felt short of breath, pt immediately slumped over in the chair, became white and was agonally breathing. Pt taken to trauma room.

## 2015-10-01 NOTE — Code Documentation (Signed)
Zoll pads placed on patient immediately upon transferring from wheelchair to stretcher.

## 2015-10-01 NOTE — Progress Notes (Signed)
CRITICAL VALUE ALERT  Critical value received:  Troponin 19.27  Date of notification:  10/01/15  Time of notification:  2130  Critical value read back:Yes.    Nurse who received alert:  Cleotis Nipper  MD notified (1st page):  Gwenlyn Found

## 2015-10-01 NOTE — Code Documentation (Signed)
Cardiology discussed plan of care with patient - cath lab called and is ready for her.

## 2015-10-01 NOTE — ED Provider Notes (Signed)
Pt presented to the ED with 2 hours of severe chest pain.  Pt has a history of ACS.  Pt walked to the waiting room and immediately on arrival patient passed out.  She immediately brought to the bed and was found to be in v fib arrest.  Pt was shocked once.  Pt shortly after regained consciousness.  Patient now states she's not having any chest pain. Eyes any shortness of breath or neck pain. The only pain that she is having is in her right knee where an intraosseous needle was placed during her resuscitation. Physical Exam  There were no vitals taken for this visit.  Physical Exam  Constitutional: She appears well-developed and well-nourished. No distress.  HENT:  Head: Normocephalic and atraumatic.  Right Ear: External ear normal.  Left Ear: External ear normal.  Eyes: Conjunctivae are normal. Right eye exhibits no discharge. Left eye exhibits no discharge. No scleral icterus.  Neck: Neck supple. No tracheal deviation present.  Cardiovascular: Normal rate, regular rhythm and normal heart sounds.   No murmur heard. Pulmonary/Chest: Effort normal. No stridor. She has no wheezes. She has no rales.  Musculoskeletal: She exhibits no edema.  Small puncture wound medial aspect below the right knee in the proximal tibia, no induration or erythema  Neurological: She is alert. Cranial nerve deficit: no gross deficits.  Skin: Skin is warm and dry. No rash noted.  Psychiatric: She has a normal mood and affect.  Nursing note and vitals reviewed.   ED Course  Procedures  EKG Interpretation  Date/Time:  Monday October 01 2015 15:33:31 EDT Ventricular Rate:  94 PR Interval:    QRS Duration: 91 QT Interval:  345 QTC Calculation: 432 R Axis:   55 Text Interpretation:  Sinus rhythm Ventricular premature complex Aberrant conduction of SV complex(es) Inferior infarct, acute (RCA) Probable RV involvement, suggest recording right precordial leads code stemi Confirmed by Victorina Kable  MD-J, Matilyn Fehrman [id number]) on  10/01/2015 3:38:06 PM       MDM The patient presented to the emergency room in V. fib arrest. She responded to one shock. Her EKG as showing an acute inferior ST elevation MI.  Code STEMI has been activated. We'll monitor closely. I ordered an amiodarone infusion.       Dorie Rank, MD 10/01/15 [phone]

## 2015-10-01 NOTE — Code Documentation (Signed)
IO placed in R tib/fib

## 2015-10-01 NOTE — Progress Notes (Signed)
Pt. Femoral sheath pulled at 1922.  Pressure held by Arma Heading for 30 min.  Site was soft and had no bleeding after 30 min.  Gauze with pressure tape applied.  Vitals monitored per protocol and patient educated.  Will continue to monitor closely.  Cleotis Nipper at bedside for sheath pull.

## 2015-10-01 NOTE — ED Triage Notes (Signed)
Patient reports CP episode starting earlier today - took 2 NTG sprays, without relief. Walks into waiting room c/o SOB and CP. According to Nurse First, patient lost consciousness while in wheelchair. Immediately wheeled back to Trauma A, where it shows VF on monitor. Patient has multiple stents, cardiac hx.

## 2015-10-01 NOTE — H&P (Signed)
History and Physical  Patient ID: Tricia [name] MRN: [id number], SOB: 10-20-1945 70 y.o. Date of Encounter: 10/01/2015, 5:03 PM  Primary Physician: Garret Reddish, MD Primary Cardiologist: none (was Dr Ron Parker)  Chief Complaint: chest pain  HPI: 70 y.o. female w/ PMHx significant for CAD, HTN, hyperlipidemia who presented to [facility] on 10/01/2015 with complaints of chest pain.  The patient developed acute onset of substernal chest pressure this morning. Describes the pain as a pressure-like sensation in the center of her chest. Pain did not radiate to the neck, shoulders, or arms. She has never had pain like this in the past. Rate in the pain at 5/10. She drove herself to the emergency department. While in triage, she lost consciousness suddenly. She was immediately brought back into a trauma bay and CPR was started. Intraosseous epinephrine was given at a dose of 1 mg. The patient was placed on the monitor and was found to be in ventricular fibrillation. She was defibrillated and her post defibrillation EKG shows an inferior STEMI. She rapidly regained consciousness and states that her pain is improved. At the time of my evaluation, she has minimal chest discomfort and denies dyspnea, orthopnea, PND. She states that she had marked diaphoresis this morning associated with her chest pain. She has otherwise been in her normal state of health.  She has a remote history of coronary artery disease. She underwent previous bare-metal stenting of the LAD. Her last heart catheterization in 2004 demonstrated mild diffuse nonobstructive CAD and patency of her stent sites. She has no history of congestive heart failure. She has not followed up with cardiology since her last visit with Dr. Ron Parker in 2011.   Past Medical History:  Diagnosis Date  . Adenomatous polyp of colon 02/2004  . Anemia   . Anxiety   . Arthritis   . B12 deficiency   . CAD (coronary artery disease)   . Depression   .  DEPRESSION 09/22/2006   Qualifier: Diagnosis of  By: Jimmye Norman, LPN, Winfield Cunas   . Dermatophytosis of groin and perianal area   . Diabetes mellitus   . Diverticulosis   . Hyperlipidemia   . Hypertension   . Low back pain    l5 disc  . Obesity   . Osteoporosis   . Sleep apnea   . Sleep apnea 10/03/2009   Resolved after gastric bypass    . ST elevation myocardial infarction (STEMI) of inferior wall ([place]) 10/01/2015  . TOBACCO ABUSE 10/05/2009   Qualifier: Diagnosis of  By: Stanford Breed, MD, Kandyce Rud   . Ventricular fibrillation ([place]) 10/01/2015     Surgical History:  Past Surgical History:  Procedure Laterality Date  . ABDOMINAL HYSTERECTOMY    . APPENDECTOMY    . BARIATRIC SURGERY    . CARPAL TUNNEL RELEASE     bilateral  . CERVICAL DISC SURGERY    . CHOLECYSTECTOMY    . CORONARY ANGIOPLASTY  2002   2 times  . HERNIA REPAIR    . LUMBAR LAMINECTOMY     l3-4  . NEPHRECTOMY     partial  . TONSILLECTOMY    . TOTAL KNEE ARTHROPLASTY     L  . TUBAL LIGATION    . ulnar nerve release     and thumb surgery     Home Meds: Prior to Admission medications   Medication Sig Start Date End Date Taking? Authorizing Provider  aspirin 81 MG tablet Take 81 mg by mouth daily.  Historical Provider, MD  atorvastatin (LIPITOR) 20 MG tablet Take 1 tablet (20 mg total) by mouth daily. 06/08/15   Marin Olp, MD  Biotin 10000 MCG TABS Take 1 tablet by mouth.    Historical Provider, MD  blood glucose meter kit and supplies KIT Test once daily for glucose control 10/16/14   Marin Olp, MD  glucose blood test strip Use as instructed 10/16/14   Marin Olp, MD  hydrochlorothiazide (HYDRODIURIL) 25 MG tablet Take 25 mg by mouth daily. 06/26/15   Historical Provider, MD  HYDROcodone-acetaminophen (NORCO/VICODIN) 5-325 MG tablet Take 1 tablet by mouth every 12 (twelve) hours as needed for moderate pain. 06/08/15   Marin Olp, MD  isosorbide mononitrate (IMDUR) 60 MG 24 hr  tablet TAKE 1 TABLET (60 MG TOTAL) BY MOUTH DAILY. 07/05/15   Marin Olp, MD  metoprolol (LOPRESSOR) 50 MG tablet Take 1 tablet (50 mg total) by mouth 2 (two) times daily. 10/16/14   Marin Olp, MD  nitroGLYCERIN (NITROLINGUAL) 0.4 MG/SPRAY spray Place 1 spray under the tongue every 5 (five) minutes as needed. 08/26/13   Ricard Dillon, MD  omeprazole (PRILOSEC) 40 MG capsule TAKE 1 CAPSULE (40 MG TOTAL) BY MOUTH DAILY. 04/23/15   Marin Olp, MD  tizanidine (ZANAFLEX) 2 MG capsule Take 1 capsule (2 mg total) by mouth 3 (three) times daily as needed for muscle spasms. 07/27/15   Marin Olp, MD  valsartan (DIOVAN) 320 MG tablet Take 1 tablet (320 mg total) by mouth daily. 01/01/15   Marin Olp, MD  VENTOLIN HFA 108 (90 BASE) MCG/ACT inhaler INHALE TWO PUFFS BY MOUTH EVERY SIX HOURS as needed for wheezing 01/09/14   Lucretia Kern, DO    Allergies:  Allergies  Allergen Reactions  . Sulfonamide Derivatives Diarrhea, Nausea Only and Other (See Comments)    Also, stomach cramps    Social History   Social History  . Marital status: Married    Spouse name: N/A  . Number of children: N/A  . Years of education: N/A   Occupational History  . retired Unemployed   Social History Main Topics  . Smoking status: Former Smoker    Packs/day: 1.50    Years: 35.00    Types: Cigarettes    Quit date: 03/06/2008  . Smokeless tobacco: Never Used  . Alcohol use No  . Drug use: No  . Sexual activity: Yes   Other Topics Concern  . Not on file   Social History Narrative   Widowed in 04/2012. 2 children. 3 grandkids.    Lives alone. Completely indendent.       Disabled/retired. Disabled-lifted computer paper      Hobbies: spend money-gamble     Family History  Problem Relation Age of Onset  . Coronary artery disease    . Colitis Mother     sepsis from c dif colitis  . Heart disease Father   . Stomach cancer Maternal Uncle     Review of Systems: General: negative for  chills, fever, night sweats or weight changes.  ENT: negative for rhinorrhea or epistaxis Cardiovascular: see HPI Dermatological: negative for rash Respiratory: negative for cough or wheezing, negative for shortness of breath GI: negative for vomiting, diarrhea, bright red blood per rectum, melena, or hematemesis. Positive for nausea GU: no hematuria, urgency, or frequency Neurologic: negative for visual changes, headache, or dizziness Heme: no easy bruising or bleeding Endo: negative for excessive thirst, thyroid disorder, or flushing  Musculoskeletal: negative for joint pain or swelling, negative for myalgias All other systems reviewed and are otherwise negative except as noted above.  Physical Exam: Blood pressure 117/72, pulse 93, resp. rate 24, height '5\' 2"'  (1.575 m), weight 88.5 kg (195 lb), SpO2 95 %. General: Pleasant obese woman in NAD, alert and oriented HEENT: Normocephalic, atraumatic, sclera anicteric Neck: Supple. Carotids 2+ without bruits. JVP normal Lungs: Clear bilaterally to auscultation without wheezes, rales, or rhonchi. Breathing is unlabored. Heart: RRR with normal S1 and S2. No murmurs, rubs, or gallops appreciated. Abdomen: Soft, non-tender, non-distended with normoactive bowel sounds.  Back: No CVA tenderness Msk:  Strength and tone appear normal for age. Extremities: No clubbing, cyanosis, or edema.   Neuro: CNII-XII intact, moves all extremities spontaneously. Psych:  Responds to questions appropriately with a normal affect. Skin: warm and dry without rash   Labs:   Lab Results  Component Value Date   WBC 15.3 (H) 10/01/2015   HGB 12.9 10/01/2015   HCT 38.0 10/01/2015   MCV 87.3 10/01/2015   PLT 237 10/01/2015    Recent Labs Lab 10/01/15 1530 10/01/15 1624  NA 139 142  K 3.4* 3.5  CL 107 108  CO2 17*  --   BUN 31* 33*  CREATININE 1.24* 0.90  CALCIUM 9.8  --   PROT 6.5  --   BILITOT 0.7  --   ALKPHOS 126  --   ALT 285*  --   AST 242*  --    GLUCOSE 216* 221*    Recent Labs  10/01/15 1530  TROPONINI <0.03   Lab Results  Component Value Date   CHOL 124 10/01/2015   HDL 29 (L) 10/01/2015   LDLCALC 64 10/01/2015   TRIG 157 (H) 10/01/2015   No results found for: DDIMER  Radiology/Studies:  No results found.   EKG: NSR with acute inferior STEMI pattern  CARDIAC STUDIES: Last cardiac cath 01-30-2003:  RESULTS:  HEMODYNAMICS:  LV 173/13.  AO 170/81.   CORONARIES:  The left main was normal.   The LAD had proximal and mid stent with diffuse in-stent 25% stenosis.  There was a distal stent which again had diffuse 25% irregularities within  the stented area.  First diagonal was small.  Second and third diagonals  were moderate size with diffuse luminal irregularities.   The circumflex in the AV groove had diffuse luminal irregularities.  The  distal AV groove had 40% stenosis after a third obtuse marginal before a  small posterolateral.  First obtuse marginal was small and normal.  The  second obtuse marginal was large with ostial 25% stenosis.  A third obtuse  marginal was large and normal.   The right coronary artery was dominant.  There was proximal 25 and 30%  stenosis.  There was mid 25% and 30% stenosis.  There were diffuse luminal  irregularities throughout the vessel.   LEFT VENTRICULOGRAM:  The left ventriculogram was obtained in the RAO  projection.  The EF was 65% with normal wall motion.   CONCLUSION:  1. Nonobstructive residual coronary disease.  2. Well preserved ejection fraction.   PLAN:  The patient will continue to have aggressive secondary risk  reduction.  She will follow up with Dr. Arnoldo Morale for evaluation of nonanginal  chest discomfort if her symptoms recur.  ASSESSMENT AND PLAN:  1. Acute inferior STEMI: The patient has been treated appropriately with aspirin and IV unfractionated heparin bolus in the emergency department. She has been defibrillated as her  STEMI was  complicated by V. fib arrest. She is now hemodynamically stable and we are taking her emergently to the cardiac catheterization lab for catheterization and primary PCI. Emergency implied consent is obtained. Dr. Ellyn Hack will perform the procedure.  2. Essential hypertension: We will tailor her antihypertensive therapy so that she is on appropriate post MI medications with a beta blocker and ARB.  3. Hyperlipidemia: Check fasting lipid panel. Initiate a high intensity statin drug.  4. Type 2 diabetes: Controlled after gastric bypass surgery. Follow sliding scale insulin well in the hospital if necessary.  Deatra James MD 10/01/2015, 5:03 PM

## 2015-10-02 ENCOUNTER — Inpatient Hospital Stay (HOSPITAL_COMMUNITY): Payer: PPO

## 2015-10-02 ENCOUNTER — Encounter (HOSPITAL_COMMUNITY): Payer: Self-pay | Admitting: Cardiology

## 2015-10-02 DIAGNOSIS — E785 Hyperlipidemia, unspecified: Secondary | ICD-10-CM

## 2015-10-02 DIAGNOSIS — I2510 Atherosclerotic heart disease of native coronary artery without angina pectoris: Secondary | ICD-10-CM | POA: Diagnosis not present

## 2015-10-02 DIAGNOSIS — I10 Essential (primary) hypertension: Secondary | ICD-10-CM

## 2015-10-02 LAB — ECHOCARDIOGRAM COMPLETE
E decel time: 211 msec
FS: 26 % — AB (ref 28–44)
Height: 62 in
IVS/LV PW RATIO, ED: 1.1
LA ID, A-P, ES: 32 mm
LA diam end sys: 32 mm
LA diam index: 1.69 cm/m2
LA vol A4C: 39.6 ml
LA vol index: 27.7 mL/m2
LA vol: 52.3 mL
LV PW d: 10 mm — AB (ref 0.6–1.1)
LV e' LATERAL: 9.25 cm/s
LVOT SV: 94 mL
LVOT VTI: 27.1 cm
LVOT area: 3.46 cm2
LVOT diameter: 21 mm
LVOT peak grad rest: 4 mmHg
LVOT peak vel: 105 cm/s
Lateral S' vel: 11.6 cm/s
MV Dec: 211
MV pk E vel: 0.8 m/s
P 1/2 time: 1265 ms
PV Reg vel dias: 80.1 cm/s
Reg peak vel: 261 cm/s
TAPSE: 19.5 mm
TDI e' lateral: 9.25
TDI e' medial: 6.64
TR max vel: 261 cm/s
Weight: 3121.71 oz

## 2015-10-02 LAB — BASIC METABOLIC PANEL
Anion gap: 8 (ref 5–15)
BUN: 29 mg/dL — ABNORMAL HIGH (ref 6–20)
CO2: 22 mmol/L (ref 22–32)
Calcium: 9.3 mg/dL (ref 8.9–10.3)
Chloride: 111 mmol/L (ref 101–111)
Creatinine, Ser: 1.05 mg/dL — ABNORMAL HIGH (ref 0.44–1.00)
GFR calc Af Amer: >60 mL/min (ref >60)
GFR calc non Af Amer: 53 mL/min — ABNORMAL LOW (ref >60)
Glucose, Bld: 107 mg/dL — ABNORMAL HIGH (ref 65–99)
Potassium: 4.8 mmol/L (ref 3.5–5.1)
Sodium: 141 mmol/L (ref 135–145)

## 2015-10-02 LAB — CBC
HCT: 37.4 % (ref 36.0–46.0)
Hemoglobin: 12.3 g/dL (ref 12.0–15.0)
MCH: 28.4 pg (ref 26.0–34.0)
MCHC: 32.9 g/dL (ref 30.0–36.0)
MCV: 86.4 fL (ref 78.0–100.0)
Platelets: 178 10*3/uL (ref 150–400)
RBC: 4.33 MIL/uL (ref 3.87–5.11)
RDW: 13.5 % (ref 11.5–15.5)
WBC: 8.7 10*3/uL (ref 4.0–10.5)

## 2015-10-02 LAB — GLUCOSE, CAPILLARY
Glucose-Capillary: 94 mg/dL (ref 65–99)
Glucose-Capillary: 99 mg/dL (ref 65–99)
Glucose-Capillary: 93 mg/dL (ref 65–99)
Glucose-Capillary: 95 mg/dL (ref 65–99)

## 2015-10-02 LAB — TROPONIN I
Troponin I: 37.91 ng/mL (ref <0.03)
Troponin I: 11.54 ng/mL (ref <0.03)
Troponin I: 11.46 ng/mL (ref <0.03)

## 2015-10-02 MED ORDER — PERFLUTREN LIPID MICROSPHERE
1.0000 mL | INTRAVENOUS | Status: AC | PRN
  Administered 2015-10-02: 2 mL via INTRAVENOUS
  Filled 2015-10-02: qty 10

## 2015-10-02 MED ORDER — INSULIN ASPART 100 UNIT/ML ~~LOC~~ SOLN: 0.0000 [IU] | Freq: Every day | SUBCUTANEOUS | Status: DC

## 2015-10-02 MED ORDER — SODIUM CHLORIDE 0.9% FLUSH
3.0000 mL | Freq: Two times a day (BID) | INTRAVENOUS | Status: DC
  Administered 2015-10-02 (×2): 3 mL via INTRAVENOUS

## 2015-10-02 MED ORDER — SODIUM CHLORIDE 0.9 % WEIGHT BASED INFUSION
1.0000 mL/kg/h | INTRAVENOUS | Status: DC
  Administered 2015-10-02: 1 mL/kg/h via INTRAVENOUS

## 2015-10-02 MED ORDER — PERFLUTREN LIPID MICROSPHERE
INTRAVENOUS | Status: AC
  Administered 2015-10-02: 2 mL via INTRAVENOUS
  Filled 2015-10-02: qty 10

## 2015-10-02 MED ORDER — ATORVASTATIN CALCIUM 40 MG PO TABS
40.0000 mg | ORAL_TABLET | Freq: Every day | ORAL | Status: DC
  Administered 2015-10-02: 40 mg via ORAL
  Filled 2015-10-02: qty 1

## 2015-10-02 MED ORDER — INSULIN ASPART 100 UNIT/ML ~~LOC~~ SOLN: 0.0000 [IU] | Freq: Three times a day (TID) | SUBCUTANEOUS | Status: DC

## 2015-10-02 MED ORDER — SODIUM CHLORIDE 0.9% FLUSH: 3.0000 mL | INTRAVENOUS | Status: DC | PRN

## 2015-10-02 MED ORDER — SODIUM CHLORIDE 0.9 % IV SOLN: 250.0000 mL | INTRAVENOUS | Status: DC | PRN

## 2015-10-02 MED ORDER — INSULIN ASPART 100 UNIT/ML ~~LOC~~ SOLN: 2.0000 [IU] | SUBCUTANEOUS | Status: DC

## 2015-10-02 NOTE — Progress Notes (Signed)
CARDIAC REHAB PHASE I   Pt up to chair today per MD note. Pt in chair, will plan to ambulate post staged PCI.  Completed MI/stent education. Reviewed risk factors, MI book, anti-platelet therapy, stent card, activity restrictions, ntg, exercise, heart healthy diet, carb counting, portion control, and phase 2 cardiac rehab. Pt verbalized understanding. Pt agrees to phase 2 cardiac rehab referral, will send to [facility], will place CRP2 order post staged PCI. Pt in recliner, call bell within reach. Will follow.  [id number] Lenna Sciara, RN, BSN 10/02/2015 3:22 PM

## 2015-10-02 NOTE — Progress Notes (Signed)
  Echocardiogram 2D Echocardiogram with Definity has been performed.  Tricia [name] 10/02/2015, 4:40 PM

## 2015-10-02 NOTE — Progress Notes (Signed)
Subjective:  Chest is sore, hurts with movement. Back pain. No chest pain like she experienced yesterday with her MI.   Objective:  Vital Signs in the last 24 hours: Temp:  [97.7 F (36.5 C)] 97.7 F (36.5 C) (08/29 0400) Pulse Rate:  [0-125] 68 (08/29 0700) Resp:  [0-35] 13 (08/29 0700) BP: (86-131)/(47-95) 86/48 (08/29 0700) SpO2:  [0 %-100 %] 93 % (08/29 0700) Arterial Line BP: (106-147)/(57-80) 121/66 (08/28 2115) Weight:  [88.5 kg (195 lb)-88.5 kg (195 lb 1.7 oz)] 88.5 kg (195 lb 1.7 oz) (08/28 1830)  Intake/Output from previous day: 08/28 0701 - 08/29 0700 In: 1122 [P.O.:240; I.V.:882] Out: -   Physical Exam: Pt is alert and oriented, NAD HEENT: normal Neck: JVP - normal, carotids 2+= without bruits Lungs: CTA bilaterally CV: RRR without murmur or gallop Abd: soft, NT, Positive BS, no hepatomegaly Ext: no C/C/E, distal pulses intact and equal Skin: warm/dry no rash   Lab Results:  Recent Labs  10/01/15 1530 10/01/15 1624 10/02/15 0152  WBC 15.3*  --  8.7  HGB 15.6* 12.9 12.3  PLT 237  --  178    Recent Labs  10/01/15 1530 10/01/15 1624 10/02/15 0152  NA 139 142 141  K 3.4* 3.5 4.8  CL 107 108 111  CO2 17*  --  22  GLUCOSE 216* 221* 107*  BUN 31* 33* 29*  CREATININE 1.24* 0.90 1.05*    Recent Labs  10/01/15 2006 10/02/15 0152  TROPONINI 19.27* 37.91*    Cardiac Studies: Cardiac Cath 10/01/2015: Conclusion     Ost RCA to Prox RCA lesion, 80 %stenosed followed by Prox RCA lesion, 100 % thrombotic stenosis.  PTCA followed by PCI with a STENT SYNERGY DES 2.75X32 drug eluting stent was successfully placed. Post intervention, there is a 0% residual stenosis.  Mid RCA lesion, 65 %stenosed - uncertain significance.  Prox Cx to Mid Cx lesion, 40 %stenosed.  Prox LAD to Mid LAD bare metal stent, 40 %stenosed. Prox LAD lesion, 85 % in-stent restenosis  Dist LAD bare-metal stent, 25 %stenosed.  LV function is normal. The left  ventricular ejection fraction is 55-65% by visual estimate. No obvious wall motion abnormality  LV end diastolic pressure is low to normal. - 750 mL normal saline administered.  Tortuous innominate artery. Would recommend femoral access for catheterizations in the future.   Successful culprit lesion PCI of the proximal and ostial RCA lesions with a single DES stent covering both segments. 60-65% lesion in the mid RCA. Unable to evaluate with IC nitroglycerin due to hypotension.  Residual severe  mid LAD in-stent restenosis, would recommend PCI prior to discharge.  Plan:  Transfer to CCU for ongoing care. TR band removal per protocol. Sheath removal 2 hours post Angiomax  Would hold ARB and beta blocker for now given hypotension in the Cath Lab. May restart tomorrow.  Aspirin plus Brilinta for minimum of 3 months. Would then potentially consider stopping aspirin.  Continue statin and nitrate  Would plan to return for staged PCI of the LAD lesion that is at least 85% as well as relook angiography of the right coronary artery to assess if this lesion is be treated as well.  Would not return until at least Wednesday, August 30.   Tele: Personally reviewed: normal sinus rhythm  Assessment/Plan:  1. Acute inferior STEMI: s/p Primary PCI. Images/cath note reviewed. Continue ASA, brilinta, high-intensity statin. Severe residual proximal LAD stenosis noted - plan staged PCI of the LAD tomorrow  as long as she remains stable.   Up to chair today. Hold Imdur for low BP. Unable to start beta-blocker today for low BP (currently 90's but SBP in 80's earlier this am.   2. Type II DM: start sliding scale insulin/accu checks  3. HTN: BP low post-MI. Hold antihypertensives  4. Hyperlipidemia: LDL 64. Continue high-intensity statin  Dispo: keep in CCU today. Anticipate PCI of the LAD tomorrow. Will write orders. Continue same Rx as above. Up to chair.    Sherren Mocha, M.D. 10/02/2015, 8:13  AM

## 2015-10-03 ENCOUNTER — Encounter (HOSPITAL_COMMUNITY): Payer: Self-pay

## 2015-10-03 ENCOUNTER — Encounter (HOSPITAL_COMMUNITY)
Admission: EM | Disposition: A | Discharge status: Home Health | Payer: Self-pay | Source: Home / Self Care | Attending: Cardiology

## 2015-10-03 DIAGNOSIS — I469 Cardiac arrest, cause unspecified: Secondary | ICD-10-CM | POA: Diagnosis not present

## 2015-10-03 DIAGNOSIS — S2220XA Unspecified fracture of sternum, initial encounter for closed fracture: Secondary | ICD-10-CM | POA: Diagnosis not present

## 2015-10-03 DIAGNOSIS — I2119 ST elevation (STEMI) myocardial infarction involving other coronary artery of inferior wall: Secondary | ICD-10-CM | POA: Diagnosis not present

## 2015-10-03 DIAGNOSIS — I959 Hypotension, unspecified: Secondary | ICD-10-CM | POA: Diagnosis not present

## 2015-10-03 DIAGNOSIS — Z955 Presence of coronary angioplasty implant and graft: Secondary | ICD-10-CM | POA: Diagnosis not present

## 2015-10-03 DIAGNOSIS — I25110 Atherosclerotic heart disease of native coronary artery with unstable angina pectoris: Secondary | ICD-10-CM

## 2015-10-03 DIAGNOSIS — I200 Unstable angina: Secondary | ICD-10-CM | POA: Insufficient documentation

## 2015-10-03 DIAGNOSIS — Z6835 Body mass index (BMI) 35.0-35.9, adult: Secondary | ICD-10-CM | POA: Diagnosis not present

## 2015-10-03 DIAGNOSIS — S2243XA Multiple fractures of ribs, bilateral, initial encounter for closed fracture: Secondary | ICD-10-CM | POA: Diagnosis not present

## 2015-10-03 DIAGNOSIS — N390 Urinary tract infection, site not specified: Secondary | ICD-10-CM | POA: Diagnosis not present

## 2015-10-03 DIAGNOSIS — E11649 Type 2 diabetes mellitus with hypoglycemia without coma: Secondary | ICD-10-CM | POA: Diagnosis not present

## 2015-10-03 DIAGNOSIS — E785 Hyperlipidemia, unspecified: Secondary | ICD-10-CM | POA: Diagnosis not present

## 2015-10-03 DIAGNOSIS — I4901 Ventricular fibrillation: Secondary | ICD-10-CM | POA: Diagnosis not present

## 2015-10-03 HISTORY — PX: CARDIAC CATHETERIZATION: SHX172

## 2015-10-03 LAB — GLUCOSE, CAPILLARY
Glucose-Capillary: 87 mg/dL (ref 65–99)
Glucose-Capillary: 75 mg/dL (ref 65–99)
Glucose-Capillary: 87 mg/dL (ref 65–99)
Glucose-Capillary: 92 mg/dL (ref 65–99)

## 2015-10-03 LAB — POCT ACTIVATED CLOTTING TIME
Activated Clotting Time: 648 seconds
Activated Clotting Time: 153 seconds

## 2015-10-03 LAB — BASIC METABOLIC PANEL
Anion gap: 9 (ref 5–15)
BUN: 22 mg/dL — ABNORMAL HIGH (ref 6–20)
CO2: 21 mmol/L — ABNORMAL LOW (ref 22–32)
Calcium: 9.3 mg/dL (ref 8.9–10.3)
Chloride: 111 mmol/L (ref 101–111)
Creatinine, Ser: 1.02 mg/dL — ABNORMAL HIGH (ref 0.44–1.00)
GFR calc Af Amer: >60 mL/min (ref >60)
GFR calc non Af Amer: 55 mL/min — ABNORMAL LOW (ref >60)
Glucose, Bld: 103 mg/dL — ABNORMAL HIGH (ref 65–99)
Potassium: 3.6 mmol/L (ref 3.5–5.1)
Sodium: 141 mmol/L (ref 135–145)

## 2015-10-03 LAB — TROPONIN I: Troponin I: 7.51 ng/mL (ref <0.03)

## 2015-10-03 SURGERY — CORONARY STENT INTERVENTION

## 2015-10-03 MED ORDER — ATORVASTATIN CALCIUM 80 MG PO TABS
80.0000 mg | ORAL_TABLET | Freq: Every day | ORAL | Status: DC
  Administered 2015-10-03 – 2015-10-10 (×8): 80 mg via ORAL
  Filled 2015-10-03 (×8): qty 1

## 2015-10-03 MED ORDER — IOPAMIDOL (ISOVUE-370) INJECTION 76%
INTRAVENOUS | Status: DC | PRN
  Administered 2015-10-03: 155 mL via INTRA_ARTERIAL

## 2015-10-03 MED ORDER — ONDANSETRON HCL 4 MG/2ML IJ SOLN: 4.0000 mg | Freq: Four times a day (QID) | INTRAMUSCULAR | Status: DC | PRN

## 2015-10-03 MED ORDER — HEPARIN (PORCINE) IN NACL 2-0.9 UNIT/ML-% IJ SOLN
INTRAMUSCULAR | Status: AC
  Filled 2015-10-03: qty 1000

## 2015-10-03 MED ORDER — BIVALIRUDIN BOLUS VIA INFUSION - CUPID
INTRAVENOUS | Status: DC | PRN
Start: 2015-10-03 — End: 2015-10-03
  Administered 2015-10-03: 66.375 mg via INTRAVENOUS

## 2015-10-03 MED ORDER — SODIUM CHLORIDE 0.9 % IV SOLN
INTRAVENOUS | Status: DC | PRN
  Administered 2015-10-03: 1.75 mg/kg/h via INTRAVENOUS

## 2015-10-03 MED ORDER — SODIUM CHLORIDE 0.9% FLUSH
3.0000 mL | Freq: Two times a day (BID) | INTRAVENOUS | Status: DC
Start: 2015-10-03 — End: 2015-10-05
  Administered 2015-10-04 – 2015-10-05 (×2): 3 mL via INTRAVENOUS

## 2015-10-03 MED ORDER — MIDAZOLAM HCL 2 MG/2ML IJ SOLN
INTRAMUSCULAR | Status: AC
  Filled 2015-10-03: qty 2

## 2015-10-03 MED ORDER — FENTANYL CITRATE (PF) 100 MCG/2ML IJ SOLN
INTRAMUSCULAR | Status: DC | PRN
  Administered 2015-10-03: 50 ug via INTRAVENOUS
  Administered 2015-10-03: 25 ug via INTRAVENOUS

## 2015-10-03 MED ORDER — IOPAMIDOL (ISOVUE-370) INJECTION 76%
INTRAVENOUS | Status: AC
  Filled 2015-10-03: qty 125

## 2015-10-03 MED ORDER — FENTANYL CITRATE (PF) 100 MCG/2ML IJ SOLN
25.0000 ug | INTRAMUSCULAR | Status: DC | PRN
  Administered 2015-10-03: 50 ug via INTRAVENOUS
  Administered 2015-10-03: 25 ug via INTRAVENOUS
  Administered 2015-10-04 – 2015-10-07 (×2): 50 ug via INTRAVENOUS
  Filled 2015-10-03 (×4): qty 2

## 2015-10-03 MED ORDER — FENTANYL CITRATE (PF) 100 MCG/2ML IJ SOLN
INTRAMUSCULAR | Status: AC
  Filled 2015-10-03: qty 2

## 2015-10-03 MED ORDER — LIDOCAINE HCL (PF) 1 % IJ SOLN
INTRAMUSCULAR | Status: DC | PRN
  Administered 2015-10-03: 18 mL

## 2015-10-03 MED ORDER — IOPAMIDOL (ISOVUE-370) INJECTION 76%
INTRAVENOUS | Status: AC
  Filled 2015-10-03: qty 50

## 2015-10-03 MED ORDER — TIZANIDINE HCL 4 MG PO TABS
4.0000 mg | ORAL_TABLET | Freq: Three times a day (TID) | ORAL | Status: DC | PRN
  Administered 2015-10-03 – 2015-10-11 (×10): 4 mg via ORAL
  Filled 2015-10-03 (×15): qty 1

## 2015-10-03 MED ORDER — BIVALIRUDIN 250 MG IV SOLR
INTRAVENOUS | Status: AC
  Filled 2015-10-03: qty 250

## 2015-10-03 MED ORDER — DIAZEPAM 5 MG PO TABS
5.0000 mg | ORAL_TABLET | ORAL | Status: DC | PRN
  Administered 2015-10-03 – 2015-10-10 (×26): 5 mg via ORAL
  Filled 2015-10-03 (×26): qty 1

## 2015-10-03 MED ORDER — TICAGRELOR 90 MG PO TABS: 90.0000 mg | ORAL_TABLET | Freq: Two times a day (BID) | ORAL | Status: DC

## 2015-10-03 MED ORDER — SODIUM CHLORIDE 0.9 % IV SOLN: 250.0000 mL | INTRAVENOUS | Status: DC | PRN

## 2015-10-03 MED ORDER — ACETAMINOPHEN 325 MG PO TABS: 650.0000 mg | ORAL_TABLET | ORAL | Status: DC | PRN

## 2015-10-03 MED ORDER — HEPARIN (PORCINE) IN NACL 2-0.9 UNIT/ML-% IJ SOLN
INTRAMUSCULAR | Status: DC | PRN
  Administered 2015-10-03: 1000 mL

## 2015-10-03 MED ORDER — NITROGLYCERIN 1 MG/10 ML FOR IR/CATH LAB
INTRA_ARTERIAL | Status: DC | PRN
  Administered 2015-10-03 (×3): 200 ug via INTRACORONARY

## 2015-10-03 MED ORDER — SODIUM CHLORIDE 0.9 % IV SOLN
INTRAVENOUS | Status: DC
  Administered 2015-10-03: 11:00:00 via INTRAVENOUS

## 2015-10-03 MED ORDER — ATROPINE SULFATE 1 MG/10ML IJ SOSY
PREFILLED_SYRINGE | INTRAMUSCULAR | Status: AC
  Filled 2015-10-03: qty 10

## 2015-10-03 MED ORDER — LIDOCAINE HCL (PF) 1 % IJ SOLN
INTRAMUSCULAR | Status: AC
  Filled 2015-10-03: qty 30

## 2015-10-03 MED ORDER — NITROGLYCERIN 1 MG/10 ML FOR IR/CATH LAB
INTRA_ARTERIAL | Status: AC
  Filled 2015-10-03: qty 10

## 2015-10-03 MED ORDER — MIDAZOLAM HCL 2 MG/2ML IJ SOLN
INTRAMUSCULAR | Status: DC | PRN
  Administered 2015-10-03: 1 mg via INTRAVENOUS
  Administered 2015-10-03: 2 mg via INTRAVENOUS

## 2015-10-03 MED ORDER — ASPIRIN 81 MG PO CHEW: 81.0000 mg | CHEWABLE_TABLET | Freq: Every day | ORAL | Status: DC

## 2015-10-03 MED ORDER — SODIUM CHLORIDE 0.9% FLUSH: 3.0000 mL | INTRAVENOUS | Status: DC | PRN

## 2015-10-03 MED FILL — Medication: Qty: 1 | Status: AC

## 2015-10-03 SURGICAL SUPPLY — 15 items
BALLN ANGIOSCULPT RX 2.5X15 (BALLOONS) ×2
BALLN ~~LOC~~ EMERGE MR 3.25X20 (BALLOONS) ×2
BALLOON ANGIOSCULPT RX 2.5X15 (BALLOONS) ×1 IMPLANT
BALLOON ~~LOC~~ EMERGE MR 3.25X20 (BALLOONS) ×1 IMPLANT
CATH INFINITI 5FR MULTPACK ANG (CATHETERS) ×2 IMPLANT
CATH VISTA GUIDE 6FR XBLAD3.5 (CATHETERS) ×2 IMPLANT
KIT ENCORE 26 ADVANTAGE (KITS) ×2 IMPLANT
KIT HEART LEFT (KITS) ×2 IMPLANT
PACK CARDIAC CATHETERIZATION (CUSTOM PROCEDURE TRAY) ×2 IMPLANT
SHEATH PINNACLE 6F 10CM (SHEATH) ×2 IMPLANT
STENT SYNERGY DES 3X32 (Permanent Stent) ×2 IMPLANT
TRANSDUCER W/STOPCOCK (MISCELLANEOUS) ×2 IMPLANT
TUBING CIL FLEX 10 FLL-RA (TUBING) ×2 IMPLANT
WIRE ASAHI PROWATER 180CM (WIRE) ×2 IMPLANT
WIRE EMERALD 3MM-J .035X150CM (WIRE) ×2 IMPLANT

## 2015-10-03 NOTE — Progress Notes (Signed)
R. Arterial sheath removed at 1600. Manual pressure applied for approximately 35 minutes. During first 5 minutes of removal site began to bleed and develop a hematoma, additional pressure held by multiple RN's. Site now level 2 with 5-6cm of blue/purple hematoma. Cath lab called to assess site. Per cath lab hold pressure for additional 10 minutes. Pressure held for 10 minutes. Vitals stable throughout procedure. Post sheath removal education given to patient. Pressure dressing applied. Will continue to monitor and assess patient closely.

## 2015-10-03 NOTE — H&P (View-Only) (Signed)
Subjective:  Chest is sore, hurts with movement. Back pain. No chest pain like she experienced yesterday with her MI.   Objective:  Vital Signs in the last 24 hours: Temp:  [97.7 F (36.5 C)] 97.7 F (36.5 C) (08/29 0400) Pulse Rate:  [0-125] 68 (08/29 0700) Resp:  [0-35] 13 (08/29 0700) BP: (86-131)/(47-95) 86/48 (08/29 0700) SpO2:  [0 %-100 %] 93 % (08/29 0700) Arterial Line BP: (106-147)/(57-80) 121/66 (08/28 2115) Weight:  [88.5 kg (195 lb)-88.5 kg (195 lb 1.7 oz)] 88.5 kg (195 lb 1.7 oz) (08/28 1830)  Intake/Output from previous day: 08/28 0701 - 08/29 0700 In: 1122 [P.O.:240; I.V.:882] Out: -   Physical Exam: Pt is alert and oriented, NAD HEENT: normal Neck: JVP - normal, carotids 2+= without bruits Lungs: CTA bilaterally CV: RRR without murmur or gallop Abd: soft, NT, Positive BS, no hepatomegaly Ext: no C/C/E, distal pulses intact and equal Skin: warm/dry no rash   Lab Results:  Recent Labs  10/01/15 1530 10/01/15 1624 10/02/15 0152  WBC 15.3*  --  8.7  HGB 15.6* 12.9 12.3  PLT 237  --  178    Recent Labs  10/01/15 1530 10/01/15 1624 10/02/15 0152  NA 139 142 141  K 3.4* 3.5 4.8  CL 107 108 111  CO2 17*  --  22  GLUCOSE 216* 221* 107*  BUN 31* 33* 29*  CREATININE 1.24* 0.90 1.05*    Recent Labs  10/01/15 2006 10/02/15 0152  TROPONINI 19.27* 37.91*    Cardiac Studies: Cardiac Cath 10/01/2015: Conclusion     Ost RCA to Prox RCA lesion, 80 %stenosed followed by Prox RCA lesion, 100 % thrombotic stenosis.  PTCA followed by PCI with a STENT SYNERGY DES 2.75X32 drug eluting stent was successfully placed. Post intervention, there is a 0% residual stenosis.  Mid RCA lesion, 65 %stenosed - uncertain significance.  Prox Cx to Mid Cx lesion, 40 %stenosed.  Prox LAD to Mid LAD bare metal stent, 40 %stenosed. Prox LAD lesion, 85 % in-stent restenosis  Dist LAD bare-metal stent, 25 %stenosed.  LV function is normal. The left  ventricular ejection fraction is 55-65% by visual estimate. No obvious wall motion abnormality  LV end diastolic pressure is low to normal. - 750 mL normal saline administered.  Tortuous innominate artery. Would recommend femoral access for catheterizations in the future.   Successful culprit lesion PCI of the proximal and ostial RCA lesions with a single DES stent covering both segments. 60-65% lesion in the mid RCA. Unable to evaluate with IC nitroglycerin due to hypotension.  Residual severe  mid LAD in-stent restenosis, would recommend PCI prior to discharge.  Plan:  Transfer to CCU for ongoing care. TR band removal per protocol. Sheath removal 2 hours post Angiomax  Would hold ARB and beta blocker for now given hypotension in the Cath Lab. May restart tomorrow.  Aspirin plus Brilinta for minimum of 3 months. Would then potentially consider stopping aspirin.  Continue statin and nitrate  Would plan to return for staged PCI of the LAD lesion that is at least 85% as well as relook angiography of the right coronary artery to assess if this lesion is be treated as well.  Would not return until at least Wednesday, August 30.   Tele: Personally reviewed: normal sinus rhythm  Assessment/Plan:  1. Acute inferior STEMI: s/p Primary PCI. Images/cath note reviewed. Continue ASA, brilinta, high-intensity statin. Severe residual proximal LAD stenosis noted - plan staged PCI of the LAD tomorrow  as long as she remains stable.   Up to chair today. Hold Imdur for low BP. Unable to start beta-blocker today for low BP (currently 90's but SBP in 80's earlier this am.   2. Type II DM: start sliding scale insulin/accu checks  3. HTN: BP low post-MI. Hold antihypertensives  4. Hyperlipidemia: LDL 64. Continue high-intensity statin  Dispo: keep in CCU today. Anticipate PCI of the LAD tomorrow. Will write orders. Continue same Rx as above. Up to chair.    Sherren Mocha, M.D. 10/02/2015, 8:13  AM

## 2015-10-03 NOTE — Interval H&P Note (Signed)
Cath Lab Visit (complete for each Cath Lab visit)  Clinical Evaluation Leading to the Procedure:   ACS: Yes.    Non-ACS:    Anginal Classification: CCS IV  Anti-ischemic medical therapy: Maximal Therapy (2 or more classes of medications)  Non-Invasive Test Results: No non-invasive testing performed  Prior CABG: No previous CABG      History and Physical Interval Note:  10/03/2015 9:01 AM  Tricia [name]  has presented today for surgery, with the diagnosis of cad  The various methods of treatment have been discussed with the patient and family. After consideration of risks, benefits and other options for treatment, the patient has consented to  Procedure(s): Coronary Stent Intervention (N/A) as a surgical intervention .  The patient's history has been reviewed, patient examined, no change in status, stable for surgery.  I have reviewed the patient's chart and labs.  Questions were answered to the patient's satisfaction.     Shelva Majestic

## 2015-10-03 NOTE — Progress Notes (Signed)
    Subjective:  C/o upper back pain, shoulder pain. Thinks related to spine/arthritis and having to lay on her back. Pain is severe. No chest pain.  Objective:  Vital Signs in the last 24 hours: Temp:  [97.5 F (36.4 C)-98 F (36.7 C)] 97.6 F (36.4 C) (08/30 1513) Pulse Rate:  [60-92] 60 (08/30 1513) Resp:  [11-42] 24 (08/30 1745) BP: (109-165)/(53-87) 124/64 (08/30 1745) SpO2:  [94 %-100 %] 97 % (08/30 1745) Arterial Line BP: (138-191)/(60-80) 138/61 (08/30 1555)  Intake/Output from previous day: 08/29 0701 - 08/30 0700 In: 1443.7 [P.O.:600; I.V.:843.7] Out: -   Physical Exam: Pt is alert and oriented, NAD HEENT: normal Neck: JVP - normal Lungs: CTA bilaterally CV: RRR without murmur or gallop Abd: soft, NT, Positive BS, no hepatomegaly Ext: no C/C/E, right groin site is clear Skin: warm/dry no rash   Lab Results:  Recent Labs  10/01/15 1530 10/01/15 1624 10/02/15 0152  WBC 15.3*  --  8.7  HGB 15.6* 12.9 12.3  PLT 237  --  178    Recent Labs  10/02/15 0152 10/03/15 0246  NA 141 141  K 4.8 3.6  CL 111 111  CO2 22 21*  GLUCOSE 107* 103*  BUN 29* 22*  CREATININE 1.05* 1.02*    Recent Labs  10/02/15 1942 10/03/15 1123  TROPONINI 11.46* 7.51*    Cardiac Studies: Cath notes reviewed.   Tele: Sinus rhythm  Assessment/Plan:  1. Acute inferior MI/multivessel CAD now s/p Primary PCI of the RCA and staged PCI of the LAD.  2. Type II DM: CBG's 75-95 3. HTN: BP has improved today. Resume metoprolol at half of home dose 4. Back pain: prn Fentanyl while on bedres  Dispo: mobilize tomorrow, anticipate DC 24-48 hours depending on pain control.    Sherren Mocha, M.D. 10/03/2015, 6:23 PM

## 2015-10-04 ENCOUNTER — Inpatient Hospital Stay (HOSPITAL_COMMUNITY): Payer: PPO

## 2015-10-04 DIAGNOSIS — I959 Hypotension, unspecified: Secondary | ICD-10-CM | POA: Diagnosis not present

## 2015-10-04 DIAGNOSIS — S2243XA Multiple fractures of ribs, bilateral, initial encounter for closed fracture: Secondary | ICD-10-CM | POA: Diagnosis not present

## 2015-10-04 DIAGNOSIS — Z6835 Body mass index (BMI) 35.0-35.9, adult: Secondary | ICD-10-CM | POA: Diagnosis not present

## 2015-10-04 DIAGNOSIS — Z955 Presence of coronary angioplasty implant and graft: Secondary | ICD-10-CM | POA: Diagnosis not present

## 2015-10-04 DIAGNOSIS — N390 Urinary tract infection, site not specified: Secondary | ICD-10-CM | POA: Diagnosis not present

## 2015-10-04 DIAGNOSIS — I4901 Ventricular fibrillation: Secondary | ICD-10-CM | POA: Diagnosis not present

## 2015-10-04 DIAGNOSIS — I2119 ST elevation (STEMI) myocardial infarction involving other coronary artery of inferior wall: Secondary | ICD-10-CM | POA: Diagnosis not present

## 2015-10-04 DIAGNOSIS — I469 Cardiac arrest, cause unspecified: Secondary | ICD-10-CM | POA: Diagnosis not present

## 2015-10-04 DIAGNOSIS — E11649 Type 2 diabetes mellitus with hypoglycemia without coma: Secondary | ICD-10-CM | POA: Diagnosis not present

## 2015-10-04 DIAGNOSIS — S2220XA Unspecified fracture of sternum, initial encounter for closed fracture: Secondary | ICD-10-CM | POA: Diagnosis not present

## 2015-10-04 DIAGNOSIS — S2241XA Multiple fractures of ribs, right side, initial encounter for closed fracture: Secondary | ICD-10-CM | POA: Diagnosis not present

## 2015-10-04 DIAGNOSIS — E785 Hyperlipidemia, unspecified: Secondary | ICD-10-CM | POA: Diagnosis not present

## 2015-10-04 LAB — CBC
HCT: 36.8 % (ref 36.0–46.0)
Hemoglobin: 11.9 g/dL — ABNORMAL LOW (ref 12.0–15.0)
MCH: 28.3 pg (ref 26.0–34.0)
MCHC: 32.3 g/dL (ref 30.0–36.0)
MCV: 87.4 fL (ref 78.0–100.0)
Platelets: 164 10*3/uL (ref 150–400)
RBC: 4.21 MIL/uL (ref 3.87–5.11)
RDW: 14.1 % (ref 11.5–15.5)
WBC: 7.3 10*3/uL (ref 4.0–10.5)

## 2015-10-04 LAB — BASIC METABOLIC PANEL
Anion gap: 9 (ref 5–15)
BUN: 15 mg/dL (ref 6–20)
CO2: 19 mmol/L — ABNORMAL LOW (ref 22–32)
Calcium: 9.1 mg/dL (ref 8.9–10.3)
Chloride: 110 mmol/L (ref 101–111)
Creatinine, Ser: 0.95 mg/dL (ref 0.44–1.00)
GFR calc Af Amer: >60 mL/min (ref >60)
GFR calc non Af Amer: >60 mL/min (ref >60)
Glucose, Bld: 78 mg/dL (ref 65–99)
Potassium: 3.5 mmol/L (ref 3.5–5.1)
Sodium: 138 mmol/L (ref 135–145)

## 2015-10-04 LAB — GLUCOSE, CAPILLARY
Glucose-Capillary: 77 mg/dL (ref 65–99)
Glucose-Capillary: 111 mg/dL — ABNORMAL HIGH (ref 65–99)
Glucose-Capillary: 87 mg/dL (ref 65–99)
Glucose-Capillary: 93 mg/dL (ref 65–99)

## 2015-10-04 MED ORDER — POLYETHYLENE GLYCOL 3350 17 G PO PACK
17.0000 g | PACK | Freq: Every day | ORAL | Status: DC
  Administered 2015-10-04 – 2015-10-11 (×8): 17 g via ORAL
  Filled 2015-10-04 (×8): qty 1

## 2015-10-04 MED ORDER — HYDROCODONE-ACETAMINOPHEN 5-325 MG PO TABS
1.0000 | ORAL_TABLET | Freq: Four times a day (QID) | ORAL | Status: DC | PRN
  Administered 2015-10-04 – 2015-10-08 (×11): 2 via ORAL
  Filled 2015-10-04 (×11): qty 2

## 2015-10-04 MED ORDER — CARVEDILOL 6.25 MG PO TABS
6.2500 mg | ORAL_TABLET | Freq: Two times a day (BID) | ORAL | Status: DC
  Administered 2015-10-04 – 2015-10-05 (×3): 6.25 mg via ORAL
  Filled 2015-10-04 (×3): qty 1

## 2015-10-04 NOTE — Progress Notes (Signed)
   Subjective: She was sitting in chair, c/o substernal central chest pain and back pain along left shoulder blade,it's 4/10 at rest and increase to 8-10/10 with any movement.She states that she she is unable to move her left arm, neck due to pain. Also c/o SOB and intermittent gasping which also increases her pain.Also c/o constipation.  Objective:  Vital signs in last 24 hours: Vitals:   10/04/15 0700 10/04/15 0800 10/04/15 0803 10/04/15 0900  BP:   (!) 144/71   Pulse:      Resp: 19 (!) 23 (!) 28 16  Temp:   98.2 F (36.8 C)   TempSrc:   Oral   SpO2: 99% 98% 100% 96%  Weight:      Height:       Physical Exam: General:Pt is alert and oriented, looks uncomfortable and in mild      distress. Neck: JVP - normal, Restricted neck movement due to pain. Chest Wall; Tenderness at mid sternal area along with right border    of left  Shoulder blade. Lungs: CTA bilaterally CV: RRR without murmur or gallop Abdomen: soft, NT, Positive BS, no hepatomegaly Ext: no C/C/E, right groin site has mild oozing stain on bandage.  Labs: CBC Latest Ref Rng & Units 10/04/2015 10/02/2015 10/01/2015  WBC 4.0 - 10.5 K/uL 7.3 8.7 -  Hemoglobin 12.0 - 15.0 g/dL 11.9(L) 12.3 12.9  Hematocrit 36.0 - 46.0 % 36.8 37.4 38.0  Platelets 150 - 400 K/uL 164 178 -   BMP Latest Ref Rng & Units 10/04/2015 10/03/2015 10/02/2015  Glucose 65 - 99 mg/dL 78 103(H) 107(H)  BUN 6 - 20 mg/dL 15 22(H) 29(H)  Creatinine 0.44 - 1.00 mg/dL 0.95 1.02(H) 1.05(H)  Sodium 135 - 145 mmol/L 138 141 141  Potassium 3.5 - 5.1 mmol/L 3.5 3.6 4.8  Chloride 101 - 111 mmol/L 110 111 111  CO2 22 - 32 mmol/L 19(L) 21(L) 22  Calcium 8.9 - 10.3 mg/dL 9.1 9.3 9.3    Assessment/Plan:  .Acute inferior MI/multivessel CAD:  Now s/p Primary PCI of the RCA and staged PCI of the LAD. Stable and her current ECG showing SR, with inferior infarct.No other acute change. -Continue ASA, and Brilinta -Lipitor 80 mg   Chest wall tenderness: Might be due to  CPR done initially in ED. She has an h/o osteoporosis. -CT chest with out contrast to see any chest wall abnormalities. -Increases her Norvo/vicodin to 2 tablets Q6H PRN.  Dyspnea:Most probably due to [place]. We explain that to patient and if it does not improved within next week, we can switch to plavix.  HTN: BP has improved. -Start Carvedilol 6.25 mg BID today and monitor her response. -Will consider adding her home dose of Diovan 320 mg daily tomorrow.  Constipation:Will give her some Miralax PRN   Dispo: Anticipated discharge in approximately 1-2 day(s).   Lorella Nimrod, MD 10/04/2015, 9:46 AM Pager: [phone]  Patient seen, examined. Available data reviewed. Agree with findings, assessment, and plan as outlined by Dr Reesa Chew. Pertinent exam findings include tender chest wall and back, clear lung fields, heart RRR without murmur, and no peripheral edema. Plan as outlined with mobilization, ambulate with cardiac rehab, check non-contrast chest CT to assess for rib/back/sternal injury. Post-MI medical program reviewed and agree with carvedilol today, continue DAPT and high-intensity statin. Add back ARB tomorrow if BP stable. Anticipate home tomorrow.  Sherren Mocha, M.D. 10/04/2015 10:56 AM

## 2015-10-04 NOTE — Progress Notes (Signed)
CARDIAC REHAB PHASE I   PRE:  Rate/Rhythm: 60 SR  BP:  Sitting: 87/58        SaO2: 98 RA  MODE:  Ambulation: 270 ft   POST:  Rate/Rhythm: 76 SR  BP:  Sitting: 106/72         SaO2: 99 RA  Pt ambulated 270 ft on RA, rolling walker, assist x1, very slow, steady gait, tolerated fairly well. Pt c/o ongoing pain in her chest/back, mild DOE, denies any other complaints, brief standing rest x4. Reviewed education, discussed phase 2 cardiac rehab. Pt agrees to phase 2 cardiac rehab referral, will send to [facility] per pt request. Pt would also benefit from rolling walker for home use, RN notified. Pt to recliner after walk, call bell within reach. Will follow.   [phone] Lenna Sciara, RN, BSN 10/04/2015 12:28 PM

## 2015-10-04 NOTE — Care Management Note (Signed)
Case Management Note  Patient Details  Name: Tricia [name] MRN: [id number] Date of Birth: [birth year]  Subjective/Objective:        Adm w mi            Action/Plan: lives at home, indep pta   Expected Discharge Date:                  Expected Discharge Plan:  [place]  In-House Referral:     Discharge planning Services  CM Consult, Medication Assistance  Post Acute Care Choice:  Home Health Choice offered to:  Patient  DME Arranged:    DME Agency:     HH Arranged:  RN, Social Work [employer] Agency:  [place]  Status of Service:  Completed, signed off  If discussed at [employer], dates discussed:    Additional Comments: pt to work on Materials engineer. Gave pt 30day free [place] card. md in room when I saw pt. She would like hhrn and hhsw. Went over list of hhc agencies and no pref. Ref to [facility] for hhrn and hhsw. Will need orders from md and face to face.  Lacretia Leigh, RN 10/04/2015, 9:43 AM

## 2015-10-05 ENCOUNTER — Inpatient Hospital Stay (HOSPITAL_COMMUNITY): Payer: PPO

## 2015-10-05 DIAGNOSIS — R109 Unspecified abdominal pain: Secondary | ICD-10-CM | POA: Diagnosis not present

## 2015-10-05 DIAGNOSIS — I2119 ST elevation (STEMI) myocardial infarction involving other coronary artery of inferior wall: Secondary | ICD-10-CM | POA: Diagnosis not present

## 2015-10-05 LAB — GLUCOSE, CAPILLARY
Glucose-Capillary: 99 mg/dL (ref 65–99)
Glucose-Capillary: 101 mg/dL — ABNORMAL HIGH (ref 65–99)
Glucose-Capillary: 88 mg/dL (ref 65–99)
Glucose-Capillary: 88 mg/dL (ref 65–99)

## 2015-10-05 MED ORDER — CLOPIDOGREL BISULFATE 300 MG PO TABS
300.0000 mg | ORAL_TABLET | Freq: Once | ORAL | Status: AC
  Administered 2015-10-05: 300 mg via ORAL
  Filled 2015-10-05: qty 1

## 2015-10-05 MED ORDER — CLOPIDOGREL BISULFATE 75 MG PO TABS
75.0000 mg | ORAL_TABLET | Freq: Every day | ORAL | Status: DC
  Administered 2015-10-06 – 2015-10-11 (×6): 75 mg via ORAL
  Filled 2015-10-05 (×6): qty 1

## 2015-10-05 MED ORDER — CARVEDILOL 3.125 MG PO TABS
3.1250 mg | ORAL_TABLET | Freq: Two times a day (BID) | ORAL | Status: DC
  Administered 2015-10-05 – 2015-10-11 (×11): 3.125 mg via ORAL
  Filled 2015-10-05 (×12): qty 1

## 2015-10-05 NOTE — Care Management Important Message (Signed)
Important Message  Patient Details  Name: Tricia [name] MRN: [id number] Date of Birth: 1945-02-22   Medicare Important Message Given:  Yes    Nathen May 10/05/2015, 9:56 AM

## 2015-10-05 NOTE — Progress Notes (Signed)
CARDIAC REHAB PHASE I   PRE:  Rate/Rhythm: 63 SR  BP:  Sitting: 146/66        SaO2: 98 RA  MODE:  Ambulation: 270 ft   POST:  Rate/Rhythm: 72 SR  BP:  Sitting: 144/64         SaO2: 98 RA  Pt c/o significant pain, particularly with deep breaths, agreeable to walk. Pt ambulated 270 ft on RA, rolling walker, assist x1, slow, steady gait, tolerated fairly well. Pt c/o DOE, brief standing rest x2. Encouraged ambulation, IS. Pt to recliner after walk, feet elevated, call bell within reach. Will follow.  Sasser, RN, BSN 10/05/2015 2:15 PM

## 2015-10-05 NOTE — Progress Notes (Signed)
Pt feels like she needs to go to rehab before going back home. Have made sw ref. req nse to put in phy there order. Pt has some broken ribs from cpr per nse. Pt lives alone. Await sw and phy there eval. hhc has been arranged if needed for hhrn and sw, can add hhpt if needed.

## 2015-10-05 NOTE — Progress Notes (Signed)
Patient complained of dull pain in right flank area, with sharp pains on palpation. Rt fem groin unchanged. VSS, Cards PA paged and new orders for abd/pelvis  CT given.

## 2015-10-05 NOTE — Progress Notes (Signed)
    Subjective:  Chest is sore. Lots of pain with movement. Intermittent shortness of breath.  Objective:  Vital Signs in the last 24 hours: Temp:  [97.3 F (36.3 C)-98.8 F (37.1 C)] 97.3 F (36.3 C) (09/01 1141) Pulse Rate:  [61-65] 65 (09/01 1141) Resp:  [16-20] 18 (09/01 0319) BP: (74-136)/(57-78) 99/64 (09/01 1141) SpO2:  [98 %-100 %] 98 % (09/01 1141)  Intake/Output from previous day: 08/31 0701 - 09/01 0700 In: 1440 [P.O.:1440] Out: -   Physical Exam: Pt is alert and oriented, NAD HEENT: normal Neck: JVP - normal Lungs: CTA bilaterally CV: RRR without murmur or gallop Abd: soft, NT, Positive BS, no hepatomegaly Ext: no C/C/E Skin: warm/dry no rash   Lab Results:  Recent Labs  10/04/15 0341  WBC 7.3  HGB 11.9*  PLT 164    Recent Labs  10/03/15 0246 10/04/15 0341  NA 141 138  K 3.6 3.5  CL 111 110  CO2 21* 19*  GLUCOSE 103* 78  BUN 22* 15  CREATININE 1.02* 0.95    Recent Labs  10/02/15 1942 10/03/15 1123  TROPONINI 11.46* 7.51*    Tele: Sinus rhythm  Assessment/Plan:  1. Inferior MI/multivessel CAD: continue med Rx. Change brilinta to plavix with ongoing dyspnea. Decrease coreg with low BP.  2. Rib/sternal fx's post-CPR. Pt lives alone - feels she cannot go home and do ADL's - I agree. Will consult PT. Continue analgesics.  3. Hypotension: decrease coreg and will give parameters  Dispo: likely ST-SNF pending PT assessment.    Sherren Mocha, M.D. 10/05/2015, 2:06 PM

## 2015-10-06 DIAGNOSIS — I4901 Ventricular fibrillation: Secondary | ICD-10-CM

## 2015-10-06 DIAGNOSIS — I469 Cardiac arrest, cause unspecified: Secondary | ICD-10-CM

## 2015-10-06 DIAGNOSIS — I2119 ST elevation (STEMI) myocardial infarction involving other coronary artery of inferior wall: Secondary | ICD-10-CM | POA: Diagnosis not present

## 2015-10-06 DIAGNOSIS — I10 Essential (primary) hypertension: Secondary | ICD-10-CM | POA: Diagnosis not present

## 2015-10-06 LAB — GLUCOSE, CAPILLARY
Glucose-Capillary: 87 mg/dL (ref 65–99)
Glucose-Capillary: 102 mg/dL — ABNORMAL HIGH (ref 65–99)
Glucose-Capillary: 83 mg/dL (ref 65–99)
Glucose-Capillary: 79 mg/dL (ref 65–99)

## 2015-10-06 NOTE — Progress Notes (Signed)
Primary cardiologist: Dr. Sherren Mocha  Seen for followup: Inferior MI  Subjective:    In bedside chair. Had episode of chest discomfort earlier this morning, states that it did not feel like her angina. Still also has thoracic soreness related to CPR. Did not feel much like eating earlier today but is trying lunch now.  Objective:   Temp:  [97.3 F (36.3 C)-97.6 F (36.4 C)] 97.5 F (36.4 C) (09/02 1102) Pulse Rate:  [67-69] 67 (09/02 1102) Resp:  [18] 18 (09/02 1102) BP: (99-147)/(62-74) 109/73 (09/02 1102) SpO2:  [97 %-100 %] 98 % (09/02 1102) Last BM Date: 10/02/15  Filed Weights   10/01/15 1543 10/01/15 1830  Weight: 195 lb (88.5 kg) 195 lb 1.7 oz (88.5 kg)    Intake/Output Summary (Last 24 hours) at 10/06/15 1238 Last data filed at 10/06/15 0200  Gross per 24 hour  Intake              360 ml  Output                0 ml  Net              360 ml    Telemetry: Sinus rhythm.   Exam:  General: Overweight woman, no distress.  Lungs: Lungs clear with decreased breath sounds.  Cardiac: RRR without gallop or rub.  Abdomen: NABS.  Extremities: No pitting edema.  Lab Results:  Basic Metabolic Panel:  Recent Labs Lab 10/01/15 1530  10/02/15 0152 10/03/15 0246 10/04/15 0341  NA 139  < > 141 141 138  K 3.4*  < > 4.8 3.6 3.5  CL 107  < > 111 111 110  CO2 17*  --  22 21* 19*  GLUCOSE 216*  < > 107* 103* 78  BUN 31*  < > 29* 22* 15  CREATININE 1.24*  < > 1.05* 1.02* 0.95  CALCIUM 9.8  --  9.3 9.3 9.1  MG 1.9  --   --   --   --   < > = values in this interval not displayed.  Liver Function Tests:  Recent Labs Lab 10/01/15 1530  AST 242*  ALT 285*  ALKPHOS 126  BILITOT 0.7  PROT 6.5  ALBUMIN 3.8    CBC:  Recent Labs Lab 10/01/15 1530 10/01/15 1624 10/02/15 0152 10/04/15 0341  WBC 15.3*  --  8.7 7.3  HGB 15.6* 12.9 12.3 11.9*  HCT 46.1* 38.0 37.4 36.8  MCV 87.3  --  86.4 87.4  PLT 237  --  178 164    Cardiac Enzymes:  Recent  Labs Lab 10/02/15 1358 10/02/15 1942 10/03/15 1123  TROPONINI 11.54* 11.46* 7.51*    Abdominal and pelvic CT 10/05/2015: IMPRESSION: 1. No acute abnormality seen to explain the patient's symptoms. 2. Soft tissue inflammation at the right inguinal region, of uncertain significance. 3. Marked chronic right renal atrophy, significantly worsened from 2009. 4 mm nonobstructing stone at the upper pole of the left kidney. 4. Trace left-sided pleural effusion, with associated atelectasis. 5. Mild coronary artery calcifications seen. 6. Mild scattered calcification along the abdominal aorta and its branches.  Echocardiogram 10/02/2015: Study Conclusions  - Left ventricle: The cavity size was normal. Wall thickness was   normal. Systolic function was normal. The estimated ejection   fraction was in the range of 60% to 65%. Wall motion was normal;   there were no regional wall motion abnormalities. - Aortic valve: There was mild regurgitation. - Right atrium:  The atrium was mildly dilated. - Tricuspid valve: There was mild-moderate regurgitation.  Medications:   Scheduled Medications: . aspirin  81 mg Oral Daily  . atorvastatin  80 mg Oral q1800  . carvedilol  3.125 mg Oral BID WC  . clopidogrel  75 mg Oral Daily  . insulin aspart  0-15 Units Subcutaneous TID WC  . insulin aspart  0-5 Units Subcutaneous QHS  . pantoprazole  40 mg Oral Daily  . polyethylene glycol  17 g Oral Daily  . sodium chloride flush  3 mL Intravenous Q12H     Infusions: . sodium chloride Stopped (10/01/15 2200)     PRN Medications:  sodium chloride, acetaminophen, albuterol, diazepam, fentaNYL (SUBLIMAZE) injection, HYDROcodone-acetaminophen, morphine injection, ondansetron (ZOFRAN) IV, sodium chloride flush, tiZANidine   Assessment:   1. Inferior STEMI now status post DES to the ostial/proximal RCA (culprit lesion) with subsequent staged intervention to the proximal LAD using DES. On aspirin and  Plavix (had shortness of breath with Brilinta). LVEF 60-65% by echocardiogram.  2. Thoracic soreness following CPR with rib fractures documented.  3. Right lower abdominal discomfort, no evidence of retroperitoneal hematoma by abdominal and pelvic CT. Incidentally noted right renal atrophy.  4. Essential hypertension, blood pressure stable.  5. Deconditioning, working with PT and cardiac rehabilitation.   Plan/Discussion:    Discussed with patient and family members. Anticipate transfer to telemetry tomorrow. Continue to work with PT and determine what she will need at discharge in terms of short-term rehabilitation or other support. On aspirin, Plavix, Coreg, Lipitor, and PPI. Follow-up lab work and ECG in a.m.   Satira Sark, M.D., F.A.C.C.

## 2015-10-06 NOTE — Progress Notes (Signed)
CARDIAC REHAB PHASE I   PRE:  Rate/Rhythm: 65 SR  BP:  Supine:   Sitting: 109/73  Standing:    SaO2: 98%RA  MODE:  Ambulation: 350 ft   POST:  Rate/Rhythm: 79 SR  BP:  Supine:   Sitting: 138/67  Standing:    SaO2: 100%RA 1120-1140 Pt walked 350 ft on RA with rolling walker and minimal asst. Painful moving and changing positions. No CP and tolerated increase in distance well. Son here. PT to evaluate.   Graylon Good, RN BSN  10/06/2015 11:37 AM

## 2015-10-06 NOTE — Evaluation (Signed)
Physical Therapy Evaluation Patient Details Name: SHAYONA LAVOIE MRN: [id number] DOB: 1945-04-29 Today's Date: 10/06/2015   History of Present Illness  70 y.o.femalew/ PMHx significant for CAD, HTN, hyperlipidemia, spinal stenosis with Lt leg radiculopathy>Rt, DM, osteoporosis, Lt TKR, adm 8/28/2017with complaints of chest pain. In ED, lost consciousness suddenly. Had CPR and found to be in v fib. She was defibrillated and EKG shows an inferior STEMI. CT chest shows Rt rib fractures 1-5 and Lt rib fractures 1-4. Pt reporting Lt scapular pain with no noted posterior rib fracture or scapular fx.    Clinical Impression  Pt admitted with above diagnosis. PTA patient was marginally independent due to severe spinal stenosis with radiculopathy (including numbness bil feet). She is now limited by pain related to sternal and rib fractures, requiring assist with almost all ADLs and mobility. Pt currently with functional limitations due to the deficits listed below (see PT Problem List).  Pt will benefit from skilled PT to increase their independence and safety with mobility to allow discharge to the venue listed below.       Follow Up Recommendations SNF;Supervision/Assistance - 24 hour (son wants to be part of discussion re: which facility) Pt/son also have questions re: home health aide availability when pt does return home.    Equipment Recommendations  Other (comment) (rollator (4 wheel walker with seat))    Recommendations for Other Services OT consult     Precautions / Restrictions Precautions Precautions: Fall Precaution Comments: fractured ribs 1-5 and sternum      Mobility  Bed Mobility               General bed mobility comments: Patient has been sleeping in hospital recliner due to discomfort in bed  Transfers Overall transfer level: Needs assistance Equipment used: Rolling walker (2 wheeled) Transfers: Sit to/from Stand Sit to Stand: Min guard         General  transfer comment: wide base of support and slight unsteadiness  Ambulation/Gait Ambulation/Gait assistance: Min assist Ambulation Distance (Feet): 20 Feet Assistive device: Rolling walker (2 wheeled) Gait Pattern/deviations: Step-through pattern;Decreased stride length;Shuffle   Gait velocity interpretation: Below normal speed for age/gender General Gait Details: observed returning to room walking with Cardiac Rehab; assist maneuver RW (rib/scapular pain); vc for proper use  Stairs            Wheelchair Mobility    Modified Rankin (Stroke Patients Only)       Balance Overall balance assessment: History of Falls;Needs assistance (2 trip falls in past 6 months) Sitting-balance support: No upper extremity supported;Feet supported Sitting balance-Leahy Scale: Fair Sitting balance - Comments: difficult to assess >fair due to pain   Standing balance support: No upper extremity supported Standing balance-Leahy Scale: Fair Standing balance comment: difficult to assess >fair due to pain         [place] - Eyes Opened:  (unable)     High Level Balance Comments: feet shoulder width, eyes closed with excessive anterior-posterior sway required min assist to maintain balance (delayed stepping)             Pertinent Vitals/Pain Pain Assessment: Faces Faces Pain Scale: Hurts little more (at rest) Pain Location: Lt scapula>anterior chest Pain Descriptors / Indicators: Grimacing;Sharp Pain Intervention(s): Limited activity within patient's tolerance;Monitored during session;Repositioned    Home Living Family/patient expects to be discharged to:: Private residence Living Arrangements: Alone Available Help at Discharge: Family;Available PRN/intermittently Type of Home: House Home Access: Stairs to enter Entrance Stairs-Rails: None Entrance Stairs-Number of  Steps: 1 (threshold) Home Layout: One level Home Equipment: Grab bars - tub/shower;Shower seat - built in Arboriculturist not usable; recliner; wants rollator) Additional Comments: doesn't use tub shower with door due to back pain and cant' get in; tiny area approacihing walkin shower     Prior Function Level of Independence: Independent         Comments: limited by spinal stenosis; very sedentary and moves slowly (at times unable to get to bathroom in time due to back issues and wears absorptive garment)     Hand Dominance   Dominant Hand: Right    Extremity/Trunk Assessment   Upper Extremity Assessment: RUE deficits/detail;LUE deficits/detail RUE Deficits / Details: able to flex shoulder to 90 and touch top of head with pain (Lt scapula)  RUE: Unable to fully assess due to pain   LUE Deficits / Details: able to flex shoulder 70 degrees, full elbow/wrist/hand ROM   Lower Extremity Assessment: RLE deficits/detail;LLE deficits/detail RLE Deficits / Details: ankle DF 4/5, numbness toes; quads 3+ on testing (?self-limiting due to pain--stood from elevated chair without use of arms) LLE Deficits / Details: ankle DF 5/5, numbness top of foot and lateral thigh; quads 3+ on testing (?self-limiting due to pain--stood from elevated chair without use of arms)  Cervical / Trunk Assessment: Other exceptions  Communication   Communication: No difficulties  Cognition Arousal/Alertness: Awake/alert Behavior During Therapy: WFL for tasks assessed/performed Overall Cognitive Status: Within Functional Limits for tasks assessed                      General Comments General comments (skin integrity, edema, etc.): son and daughter in law present with many questions answered    Exercises Other Exercises Other Exercises: Educated pateint in importance of moving UEs/shoulders as much as she can tolerate to avoid frozen shoulder (she "began to have that" with Rt shoulder previously). Educated to sit unsupported for Lt shoulder movements to decr pressure on Lt scapula and she agreed this was less  painful. Other Exercises: Educated in use of and importance of incentive spirometer. Patient reluctant to cough (even with pillow to splint her sternum/ribs) and instead tries to bring phlegm up with clearing throat.       Assessment/Plan    PT Assessment Patient needs continued PT services  PT Diagnosis Difficulty walking;Generalized weakness;Acute pain   PT Problem List Decreased strength;Decreased range of motion;Decreased activity tolerance;Decreased balance;Decreased mobility;Decreased knowledge of use of DME;Impaired sensation;Obesity;Pain  PT Treatment Interventions DME instruction;Gait training;Functional mobility training;Therapeutic activities;Therapeutic exercise;Balance training;Patient/family education   PT Goals (Current goals can be found in the Care Plan section) Acute Rehab PT Goals Patient Stated Goal: regain independence and return home PT Goal Formulation: With patient Time For Goal Achievement: 10/20/15 Potential to Achieve Goals: Good    Frequency Min 3X/week   Barriers to discharge Decreased caregiver support      Co-evaluation               End of Session Equipment Utilized During Treatment: Gait belt Activity Tolerance: Patient limited by pain Patient left: in chair;with call bell/phone within reach;with family/visitor present Nurse Communication: Mobility status;Other (comment) (discussed use of ice for pain (+/- related to healing bone))         Time: [id number] PT Time Calculation (min) (ACUTE ONLY): 51 min   Charges:   PT Evaluation $PT Eval High Complexity: 1 Procedure PT Treatments $Therapeutic Activity: 8-22 mins   PT G Codes:  Mariellen Blaney 10/06/2015, 1:02 PM Pager [phone]

## 2015-10-07 DIAGNOSIS — I469 Cardiac arrest, cause unspecified: Secondary | ICD-10-CM | POA: Diagnosis not present

## 2015-10-07 DIAGNOSIS — I959 Hypotension, unspecified: Secondary | ICD-10-CM | POA: Diagnosis not present

## 2015-10-07 DIAGNOSIS — Z955 Presence of coronary angioplasty implant and graft: Secondary | ICD-10-CM | POA: Diagnosis not present

## 2015-10-07 DIAGNOSIS — S2220XA Unspecified fracture of sternum, initial encounter for closed fracture: Secondary | ICD-10-CM | POA: Diagnosis not present

## 2015-10-07 DIAGNOSIS — S2243XA Multiple fractures of ribs, bilateral, initial encounter for closed fracture: Secondary | ICD-10-CM | POA: Diagnosis not present

## 2015-10-07 DIAGNOSIS — I2119 ST elevation (STEMI) myocardial infarction involving other coronary artery of inferior wall: Secondary | ICD-10-CM | POA: Diagnosis not present

## 2015-10-07 DIAGNOSIS — E785 Hyperlipidemia, unspecified: Secondary | ICD-10-CM | POA: Diagnosis not present

## 2015-10-07 DIAGNOSIS — E11649 Type 2 diabetes mellitus with hypoglycemia without coma: Secondary | ICD-10-CM | POA: Diagnosis not present

## 2015-10-07 DIAGNOSIS — I4901 Ventricular fibrillation: Secondary | ICD-10-CM | POA: Diagnosis not present

## 2015-10-07 DIAGNOSIS — Z6835 Body mass index (BMI) 35.0-35.9, adult: Secondary | ICD-10-CM | POA: Diagnosis not present

## 2015-10-07 DIAGNOSIS — N390 Urinary tract infection, site not specified: Secondary | ICD-10-CM | POA: Diagnosis not present

## 2015-10-07 DIAGNOSIS — I10 Essential (primary) hypertension: Secondary | ICD-10-CM | POA: Diagnosis not present

## 2015-10-07 LAB — GLUCOSE, CAPILLARY
Glucose-Capillary: 65 mg/dL (ref 65–99)
Glucose-Capillary: 110 mg/dL — ABNORMAL HIGH (ref 65–99)
Glucose-Capillary: 101 mg/dL — ABNORMAL HIGH (ref 65–99)
Glucose-Capillary: 72 mg/dL (ref 65–99)
Glucose-Capillary: 86 mg/dL (ref 65–99)

## 2015-10-07 LAB — CBC
HCT: 35.4 % — ABNORMAL LOW (ref 36.0–46.0)
Hemoglobin: 11.6 g/dL — ABNORMAL LOW (ref 12.0–15.0)
MCH: 28.4 pg (ref 26.0–34.0)
MCHC: 32.8 g/dL (ref 30.0–36.0)
MCV: 86.8 fL (ref 78.0–100.0)
Platelets: 196 10*3/uL (ref 150–400)
RBC: 4.08 MIL/uL (ref 3.87–5.11)
RDW: 13.8 % (ref 11.5–15.5)
WBC: 6.9 10*3/uL (ref 4.0–10.5)

## 2015-10-07 LAB — BASIC METABOLIC PANEL
Anion gap: 10 (ref 5–15)
BUN: 24 mg/dL — ABNORMAL HIGH (ref 6–20)
CO2: 18 mmol/L — ABNORMAL LOW (ref 22–32)
Calcium: 9.1 mg/dL (ref 8.9–10.3)
Chloride: 108 mmol/L (ref 101–111)
Creatinine, Ser: 1.11 mg/dL — ABNORMAL HIGH (ref 0.44–1.00)
GFR calc Af Amer: 58 mL/min — ABNORMAL LOW (ref >60)
GFR calc non Af Amer: 50 mL/min — ABNORMAL LOW (ref >60)
Glucose, Bld: 79 mg/dL (ref 65–99)
Potassium: 4.2 mmol/L (ref 3.5–5.1)
Sodium: 136 mmol/L (ref 135–145)

## 2015-10-07 NOTE — Progress Notes (Signed)
Reported by NT that cbg-65, asymptomatic . offered orange juice but refused, ate breakfast 100%. Repeat cbg- 110. MD aware. Cont. To monitor.

## 2015-10-07 NOTE — Progress Notes (Signed)
Ambulated along the hallway with walker,complained of back pain and requested for valium. Assisted back to chair. Call light within reach.

## 2015-10-07 NOTE — NC FL2 (Signed)
[place] LEVEL OF CARE SCREENING TOOL     IDENTIFICATION  Patient Name: Tricia [name] Birthdate: [birth year] Sex: female Admission Date (Current Location): 10/01/2015  [name] and [place] Number:  Tricia [name] and Address:  [facility] [place], [place] [address], [place], [place] [address]      Provider Number: [id number]  Attending Physician Name and Address:  Leonie Man, MD  Relative Name and Phone Number:       Current Level of Care: Hospital Recommended Level of Care: [place] Prior Approval Number:    Date Approved/Denied:   PASRR Number:   [id number]  Discharge Plan: SNF    Current Diagnoses: Patient Active Problem List   Diagnosis Date Noted  . Unstable angina ([place])   . ST elevation myocardial infarction (STEMI) of inferior wall ([place]) 10/01/2015  . Cardiac arrest with ventricular fibrillation ([place]) 10/01/2015  . ST elevation myocardial infarction (STEMI) of inferior wall, initial episode of care ([place]) 10/01/2015  . Second hand smoke exposure 07/28/2015  . Postoperative malabsorption 07/27/2015  . Recurrent UTI 03/14/2014  . GERD (gastroesophageal reflux disease) 02/14/2014  . History of gastric bypass 02/14/2014  . Former smoker 02/14/2014  . Diabetes mellitus type II, controlled ([place]) 02/14/2014  . Anxiety state 10/03/2009  . ANEMIA, VITAMIN B12 DEFICIENCY 04/04/2009  . Osteopenia 12/14/2006  . Hyperlipidemia 09/22/2006  . Essential hypertension 09/22/2006  . CAD S/P percutaneous coronary angioplasty 09/22/2006  . Osteoarthritis 09/22/2006  . Low back pain 09/22/2006    Orientation RESPIRATION BLADDER Height & Weight     Self, Time, Situation, Place  Normal Continent Weight: 195 lb 1.7 oz (88.5 kg) Height:  5\' 2"  (157.5 cm)  BEHAVIORAL SYMPTOMS/MOOD NEUROLOGICAL BOWEL NUTRITION STATUS   (NONE )  (NONE ) Continent Diet (Carb Mod )  AMBULATORY STATUS COMMUNICATION OF NEEDS Skin   Limited  Assist Verbally Other (Comment) (Right groin area moisture, dressing changes every 5 days)                       Personal Care Assistance Level of Assistance  Dressing, Feeding, Bathing Bathing Assistance: Limited assistance Feeding assistance: Limited assistance Dressing Assistance: Limited assistance     Functional Limitations Info  Sight, Hearing, Speech Sight Info: Adequate Hearing Info: Adequate Speech Info: Adequate    SPECIAL CARE FACTORS FREQUENCY  PT (By licensed PT)     PT Frequency: 3              Contractures      Additional Factors Info  Code Status, Allergies Code Status Info: FULL CODE  Allergies Info: Sulfonamide Derivatives, Sulfa Antibiotics           Current Medications (10/07/2015):  This is the current hospital active medication list Current Facility-Administered Medications  Medication Dose Route Frequency Provider Last Rate Last Dose  . 0.9 %  sodium chloride infusion  10-20 mL/hr Intravenous Continuous Dorie Rank, MD   Stopped at 10/01/15 2200  . 0.9 %  sodium chloride infusion  250 mL Intravenous PRN Leonie Man, MD      . acetaminophen (TYLENOL) tablet 650 mg  650 mg Oral Q4H PRN Leonie Man, MD   650 mg at 10/02/15 2337  . albuterol (PROVENTIL) (2.5 MG/3ML) 0.083% nebulizer solution 3 mL  3 mL Inhalation Q6H PRN Leonie Man, MD      . aspirin chewable tablet 81 mg  81 mg Oral Daily Leonie Green  Ellyn Hack, MD   81 mg at 10/07/15 0931  . atorvastatin (LIPITOR) tablet 80 mg  80 mg Oral q1800 Troy Sine, MD   80 mg at 10/06/15 1716  . carvedilol (COREG) tablet 3.125 mg  3.125 mg Oral BID WC Sherren Mocha, MD   3.125 mg at 10/07/15 0931  . clopidogrel (PLAVIX) tablet 75 mg  75 mg Oral Daily Sherren Mocha, MD   75 mg at 10/07/15 0931  . diazepam (VALIUM) tablet 5 mg  5 mg Oral Q4H PRN Troy Sine, MD   5 mg at 10/07/15 1211  . fentaNYL (SUBLIMAZE) injection 25-50 mcg  25-50 mcg Intravenous Q2H PRN Sherren Mocha, MD   50 mcg at  10/07/15 1502  . HYDROcodone-acetaminophen (NORCO/VICODIN) 5-325 MG per tablet 1-2 tablet  1-2 tablet Oral Q6H PRN Lorella Nimrod, MD   2 tablet at 10/07/15 0937  . insulin aspart (novoLOG) injection 0-15 Units  0-15 Units Subcutaneous TID WC Sherren Mocha, MD   Stopped at 10/05/15 1700  . insulin aspart (novoLOG) injection 0-5 Units  0-5 Units Subcutaneous QHS Sherren Mocha, MD      . morphine 2 MG/ML injection 2 mg  2 mg Intravenous Q1H PRN Leonie Man, MD   2 mg at 10/03/15 1607  . ondansetron (ZOFRAN) injection 4 mg  4 mg Intravenous Q6H PRN Leonie Man, MD      . pantoprazole (PROTONIX) EC tablet 40 mg  40 mg Oral Daily Leonie Man, MD   40 mg at 10/07/15 0931  . polyethylene glycol (MIRALAX / GLYCOLAX) packet 17 g  17 g Oral Daily Lorella Nimrod, MD   17 g at 10/07/15 0932  . sodium chloride flush (NS) 0.9 % injection 3 mL  3 mL Intravenous Q12H Leonie Man, MD   3 mL at 10/06/15 2316  . sodium chloride flush (NS) 0.9 % injection 3 mL  3 mL Intravenous PRN Leonie Man, MD      . tiZANidine (ZANAFLEX) tablet 4 mg  4 mg Oral TID PRN Arbutus Leas, NP   4 mg at 10/05/15 2254     Discharge Medications: Please see discharge summary for a list of discharge medications.  Relevant Imaging Results:  Relevant Lab Results:   Additional Information SSN [id number]  Rozell Searing

## 2015-10-07 NOTE — Progress Notes (Signed)
Primary cardiologist: Dr. Sherren Mocha  Seen for followup: Inferior MI  Subjective:    Still with significant rib pain. She is trying to use incentive spirometer regularly. No obvious angina. Has not been up much. Appetite fair.  Objective:   Temp:  [97.3 F (36.3 C)-97.9 F (36.6 C)] 97.3 F (36.3 C) (09/03 0702) Pulse Rate:  [67-84] 74 (09/03 0702) Resp:  [16-18] 16 (09/03 0702) BP: (109-135)/(60-73) 117/62 (09/03 0500) SpO2:  [98 %-100 %] 99 % (09/03 0100) Last BM Date: 10/02/15  Filed Weights   10/01/15 1543 10/01/15 1830  Weight: 195 lb (88.5 kg) 195 lb 1.7 oz (88.5 kg)    Intake/Output Summary (Last 24 hours) at 10/07/15 0819 Last data filed at 10/07/15 0647  Gross per 24 hour  Intake             1320 ml  Output             1100 ml  Net              220 ml    Telemetry: Sinus rhythm.   Exam:  General: Overweight woman, no distress.  Lungs: Lungs clear with decreased breath sounds.  Cardiac: RRR without gallop or rub.  Abdomen: NABS.  Extremities: No pitting edema.  Lab Results:  Basic Metabolic Panel:  Recent Labs Lab 10/01/15 1530  10/03/15 0246 10/04/15 0341 10/07/15 0217  NA 139  < > 141 138 136  K 3.4*  < > 3.6 3.5 4.2  CL 107  < > 111 110 108  CO2 17*  < > 21* 19* 18*  GLUCOSE 216*  < > 103* 78 79  BUN 31*  < > 22* 15 24*  CREATININE 1.24*  < > 1.02* 0.95 1.11*  CALCIUM 9.8  < > 9.3 9.1 9.1  MG 1.9  --   --   --   --   < > = values in this interval not displayed.  Liver Function Tests:  Recent Labs Lab 10/01/15 1530  AST 242*  ALT 285*  ALKPHOS 126  BILITOT 0.7  PROT 6.5  ALBUMIN 3.8    CBC:  Recent Labs Lab 10/02/15 0152 10/04/15 0341 10/07/15 0217  WBC 8.7 7.3 6.9  HGB 12.3 11.9* 11.6*  HCT 37.4 36.8 35.4*  MCV 86.4 87.4 86.8  PLT 178 164 196    Cardiac Enzymes:  Recent Labs Lab 10/02/15 1358 10/02/15 1942 10/03/15 1123  TROPONINI 11.54* 11.46* 7.51*    Abdominal and pelvic CT  10/05/2015: IMPRESSION: 1. No acute abnormality seen to explain the patient's symptoms. 2. Soft tissue inflammation at the right inguinal region, of uncertain significance. 3. Marked chronic right renal atrophy, significantly worsened from 2009. 4 mm nonobstructing stone at the upper pole of the left kidney. 4. Trace left-sided pleural effusion, with associated atelectasis. 5. Mild coronary artery calcifications seen. 6. Mild scattered calcification along the abdominal aorta and its branches.  Echocardiogram 10/02/2015: Study Conclusions  - Left ventricle: The cavity size was normal. Wall thickness was   normal. Systolic function was normal. The estimated ejection   fraction was in the range of 60% to 65%. Wall motion was normal;   there were no regional wall motion abnormalities. - Aortic valve: There was mild regurgitation. - Right atrium: The atrium was mildly dilated. - Tricuspid valve: There was mild-moderate regurgitation.  Medications:   Scheduled Medications: . aspirin  81 mg Oral Daily  . atorvastatin  80 mg Oral q1800  .  carvedilol  3.125 mg Oral BID WC  . clopidogrel  75 mg Oral Daily  . insulin aspart  0-15 Units Subcutaneous TID WC  . insulin aspart  0-5 Units Subcutaneous QHS  . pantoprazole  40 mg Oral Daily  . polyethylene glycol  17 g Oral Daily  . sodium chloride flush  3 mL Intravenous Q12H    Infusions: . sodium chloride Stopped (10/01/15 2200)    PRN Medications: sodium chloride, acetaminophen, albuterol, diazepam, fentaNYL (SUBLIMAZE) injection, HYDROcodone-acetaminophen, morphine injection, ondansetron (ZOFRAN) IV, sodium chloride flush, tiZANidine   Assessment:   1. Inferior STEMI now status post DES to the ostial/proximal RCA (culprit lesion) with subsequent staged intervention to the proximal LAD using DES. On aspirin and Plavix (had shortness of breath with Brilinta). LVEF 60-65% by echocardiogram.  2. Thoracic soreness following CPR with rib  fractures documented. Discussed more consistent use of analgesics. Also heat pads.  3. Right lower abdominal discomfort, no evidence of retroperitoneal hematoma by abdominal and pelvic CT. Incidentally noted right renal atrophy.  4. Essential hypertension, blood pressure elevated this morning but previously with adequate control.  5. Deconditioning, working with PT and cardiac rehabilitation.   Plan/Discussion:    Discussed with patient and nursing. Will keep her in stepdown today. Continue to work with PT and determine what she will need at discharge in terms of short-term rehabilitation or other support. On aspirin, Plavix, Coreg, Lipitor, and PPI. She has been somewhat hesitant to ask for pain medications, would probably do better with more consistent use in light of rib fracture pain and need to increase her mobility. She is also using incentive spirometer.   Satira Sark, M.D., F.A.C.C.

## 2015-10-07 NOTE — Clinical Social Work Placement (Signed)
   CLINICAL SOCIAL WORK PLACEMENT  NOTE  Date:  10/07/2015  Patient Details  Name: Tricia [name] MRN: [id number] Date of Birth: 1945/10/08  Clinical Social Work is seeking post-discharge placement for this patient at the [place] level of care (*CSW will initial, date and re-position this form in  chart as items are completed):  Yes   Patient/family provided with [place] Work Department's list of facilities offering this level of care within the geographic area requested by the patient (or if unable, by the patient's family).  Yes   Patient/family informed of their freedom to choose among providers that offer the needed level of care, that participate in Medicare, Medicaid or managed care program needed by the patient, have an available bed and are willing to accept the patient.  Yes   Patient/family informed of [place]'s ownership interest in [facility] and [facility], as well as of the fact that they are under no obligation to receive care at these facilities.  PASRR submitted to EDS on 10/07/15     PASRR number received on 10/07/15     Existing PASRR number confirmed on       FL2 transmitted to all facilities in geographic area requested by pt/family on 10/07/15     FL2 transmitted to all facilities within larger geographic area on       Patient informed that his/her managed care company has contracts with or will negotiate with certain facilities, including the following:            Patient/family informed of bed offers received.  Patient chooses bed at       Physician recommends and patient chooses bed at      Patient to be transferred to   on  .  Patient to be transferred to facility by       Patient family notified on   of transfer.  Name of family member notified:        PHYSICIAN Please prepare priority discharge summary, including medications, Please sign FL2     Additional Comment:     _______________________________________________ Glendon Axe A 10/07/2015, 3:53 PM

## 2015-10-07 NOTE — Progress Notes (Signed)
While walking past patients room RN noticed patient walking to her recliner chair from the bathroom unsteadily. Previously asked patient to call if she needed to get up so she wouldn't fall. Patient had also been asked by previous shift to call when she needed to get up, but has not been calling. RN obtained a chair alarm for her chair. Patient was agitated about it. She refused to answer the assessment questions correctly (Todays date, where are you, etc). She replied, "2045, is that what you want to hear?" RN replied that she could answer any way she wanted to and I would record her answers exactly as she answered them. Patient then answered correctly, but when ask if she needed anything for pain, drink, etc. she would say, "I don't need anything from you!"  Her son called to check on her and she told him about the chair alarm and how she can't get up on her own any more. Her son came in, and before going to the patients room he ask to speak with the nurse. He immediately apologized for his mothers behavior stating that she miserable every day of her life before coming to the hospital and is even more miserable now since she's in pain. He agreed that she wasn't steady on her feet and that she needed to call for help. He reminded the patient that she became dizzy and unbalanced earlier today when working with PT. Patient continued to be agitated and withdrawn. Son said it was best to only go in the room when you absolutely have to, because she'd rather be alone. RN let other staff members know not to bother her as her son requested. Will continue to monitor and inform MD of patients behavior tomorrow.

## 2015-10-07 NOTE — Clinical Social Work Note (Signed)
Clinical Social Work Assessment  Patient Details  Name: Tricia [name] MRN: [id number] Date of Birth: 08-04-1945  Date of referral:  10/07/15               Reason for consult:  Facility Placement, Discharge Planning                Permission sought to share information with:  Family Supports, Customer service manager, Case Optician, dispensing granted to share information::  Yes, Verbal Permission Granted  Name::      [place]ar[place] manager)  Agency::   (SNF's )  Relationship::   (Son )  Sport and exercise psychologist Information:   [phone])  Housing/Transportation Living arrangements for the past 2 months:  Single Family Home Source of Information:  Adult Children Patient Interpreter Needed:  None Criminal Activity/Legal Involvement Pertinent to Current Situation/Hospitalization:  No - Comment as needed Significant Relationships:  Adult Children Lives with:  Self Do you feel safe going back to the place where you live?  No Need for family participation in patient care:  Yes (Comment)  Care giving concerns:  Patient requiring ST rehab placement at skilled level.    Social Worker assessment / plan:  MSW spoke with patient's son at length in reference to post-acute placement for SNF. MSW introduced MSW role and SNF process. MSW also reviewed and provided SNF list at bedside. RN walking patient on unit during assessment. Pt's son agreeable to SNF placement and plans to discuss options and discharge planning with patient. No further concerns reported at this time. MSW remains available as needed. PASARR, FL-2 completed and faxed via [place].   Employment status:  Retired Nurse, adult PT Recommendations:  [place] / Referral to community resources:  [place]  Patient/Family's Response to care:  RN walking patient on unit during assessment. Pt's son agreeable to d/c planning. Pt's son pleasant and appreciated social work intervention.    Patient/Family's Understanding of and Emotional Response to Diagnosis, Current Treatment, and Prognosis:  Pt's son supportive and involved in care.   Emotional Assessment Appearance:  Appears stated age Attitude/Demeanor/Rapport:  Unable to Assess Affect (typically observed):  Unable to Assess Orientation:  Oriented to Self, Oriented to Place, Oriented to  Time, Oriented to Situation Alcohol / Substance use:  Not Applicable Psych involvement (Current and /or in the community):  No (Comment)  Discharge Needs  Concerns to be addressed:  Denies Needs/Concerns at this time Readmission within the last 30 days:    Current discharge risk:  Dependent with Mobility Barriers to Discharge:  Continued Medical Work up   Glendon Axe A 10/07/2015, 3:35 PM

## 2015-10-08 DIAGNOSIS — E1122 Type 2 diabetes mellitus with diabetic chronic kidney disease: Secondary | ICD-10-CM | POA: Diagnosis not present

## 2015-10-08 DIAGNOSIS — E785 Hyperlipidemia, unspecified: Secondary | ICD-10-CM | POA: Diagnosis not present

## 2015-10-08 DIAGNOSIS — I2510 Atherosclerotic heart disease of native coronary artery without angina pectoris: Secondary | ICD-10-CM | POA: Diagnosis not present

## 2015-10-08 DIAGNOSIS — I10 Essential (primary) hypertension: Secondary | ICD-10-CM | POA: Diagnosis not present

## 2015-10-08 DIAGNOSIS — R0781 Pleurodynia: Secondary | ICD-10-CM

## 2015-10-08 DIAGNOSIS — I2119 ST elevation (STEMI) myocardial infarction involving other coronary artery of inferior wall: Secondary | ICD-10-CM | POA: Diagnosis not present

## 2015-10-08 DIAGNOSIS — Z9861 Coronary angioplasty status: Secondary | ICD-10-CM

## 2015-10-08 DIAGNOSIS — I469 Cardiac arrest, cause unspecified: Secondary | ICD-10-CM | POA: Diagnosis not present

## 2015-10-08 LAB — BASIC METABOLIC PANEL
Anion gap: 7 (ref 5–15)
BUN: 30 mg/dL — ABNORMAL HIGH (ref 6–20)
CO2: 22 mmol/L (ref 22–32)
Calcium: 9.3 mg/dL (ref 8.9–10.3)
Chloride: 109 mmol/L (ref 101–111)
Creatinine, Ser: 1.02 mg/dL — ABNORMAL HIGH (ref 0.44–1.00)
GFR calc Af Amer: >60 mL/min (ref >60)
GFR calc non Af Amer: 55 mL/min — ABNORMAL LOW (ref >60)
Glucose, Bld: 109 mg/dL — ABNORMAL HIGH (ref 65–99)
Potassium: 4.3 mmol/L (ref 3.5–5.1)
Sodium: 138 mmol/L (ref 135–145)

## 2015-10-08 LAB — GLUCOSE, CAPILLARY
Glucose-Capillary: 92 mg/dL (ref 65–99)
Glucose-Capillary: 95 mg/dL (ref 65–99)
Glucose-Capillary: 69 mg/dL (ref 65–99)
Glucose-Capillary: 78 mg/dL (ref 65–99)
Glucose-Capillary: 92 mg/dL (ref 65–99)

## 2015-10-08 LAB — CBC
HCT: 33.9 % — ABNORMAL LOW (ref 36.0–46.0)
Hemoglobin: 10.9 g/dL — ABNORMAL LOW (ref 12.0–15.0)
MCH: 27.9 pg (ref 26.0–34.0)
MCHC: 32.2 g/dL (ref 30.0–36.0)
MCV: 86.9 fL (ref 78.0–100.0)
Platelets: 202 10*3/uL (ref 150–400)
RBC: 3.9 MIL/uL (ref 3.87–5.11)
RDW: 14.1 % (ref 11.5–15.5)
WBC: 9.1 10*3/uL (ref 4.0–10.5)

## 2015-10-08 MED ORDER — HYDROCODONE-ACETAMINOPHEN 5-325 MG PO TABS
2.0000 | ORAL_TABLET | Freq: Four times a day (QID) | ORAL | Status: DC | PRN
  Administered 2015-10-09 – 2015-10-10 (×3): 2 via ORAL
  Filled 2015-10-08 (×3): qty 2

## 2015-10-08 NOTE — Progress Notes (Addendum)
PT Cancellation Note  Patient Details Name: Tricia [name] MRN: [id number] DOB: 1945/12/21   Cancelled Treatment:    Reason Eval/Treat Not Completed: Patient unavailable. Reports she is tired from just returning from bathroom and is about to start her lunch. Explained PT will try to return this pm and could not guarantee and she continued to defer session.   Jyaire Koudelka 10/08/2015, 12:37 PM Pager [phone]  Addendum [phone])-- Returned for second attempt. RN reports patient had hypoglycemic event and currently drinking supplement to raise her blood sugar. RN reports patient then needs to rest 15 minutes and re-check CBG. Informed patient of my attempt and reason to hold PT at this time. She reported she now wants to go home and not SNF. PT will see 9/5.   10/08/2015 Barry Brunner, PT Pager: [phone]

## 2015-10-08 NOTE — Progress Notes (Signed)
Patient is now wanting to go home with home health PT, Aide, and a Education officer, museum. Son is not happy [phone]) but is in agreement with plan.  CSW signing off.  Percell Locus Taitum Menton LCSWA [phone]

## 2015-10-08 NOTE — Progress Notes (Signed)
Primary cardiologist: Dr. Sherren Mocha  Principal Problem:   ST elevation myocardial infarction (STEMI) of inferior wall, initial episode of care [facility]) Active Problems:   CAD S/P percutaneous coronary angioplasty   Cardiac arrest with ventricular fibrillation ([place])   Hyperlipidemia   Essential hypertension   Diabetes mellitus type II, controlled ([place])   Rib pain - s/p CPR   Seen for followup: Inferior MI  Subjective:    Still with significant rib pain.  She is trying to use incentive spirometer regularly. No obvious angina - actually chest wall pain.  Has not been up much, but wants to stand & walk around more!. Appetite coming along  Objective:   Temp:  [97.1 F (36.2 C)-98.1 F (36.7 C)] 97.5 F (36.4 C) (09/04 0728) Pulse Rate:  [64-67] 64 (09/04 0402) Resp:  [18] 18 (09/04 0728) BP: (85-141)/(47-67) 100/65 (09/04 0728) SpO2:  [93 %-100 %] 100 % (09/04 0728) Last BM Date: 10/02/15 Exam: General: Overweight woman, no distress. HEENT: [place]/AT, EOMI, MMM, anicteric sclera Lungs: Lungs mostlyh clear with decreased breath sounds. Cardiac: RRR without gallop or rub; non-displaced PMI. Abdomen: NABS. Extremities: No pitting edema.   Filed Weights   10/01/15 1543 10/01/15 1830  Weight: 195 lb (88.5 kg) 195 lb 1.7 oz (88.5 kg)    Intake/Output Summary (Last 24 hours) at 10/08/15 0906 Last data filed at 10/08/15 0333  Gross per 24 hour  Intake              540 ml  Output              900 ml  Net             -360 ml    Telemetry: Sinus rhythm.   Lab Results:  Basic Metabolic Panel:  Recent Labs Lab 10/01/15 1530  10/04/15 0341 10/07/15 0217 10/08/15 0227  NA 139  < > 138 136 138  K 3.4*  < > 3.5 4.2 4.3  CL 107  < > 110 108 109  CO2 17*  < > 19* 18* 22  GLUCOSE 216*  < > 78 79 109*  BUN 31*  < > 15 24* 30*  CREATININE 1.24*  < > 0.95 1.11* 1.02*  CALCIUM 9.8  < > 9.1 9.1 9.3  MG 1.9  --   --   --   --   < > = values in this interval not  displayed.  Liver Function Tests:  Recent Labs Lab 10/01/15 1530  AST 242*  ALT 285*  ALKPHOS 126  BILITOT 0.7  PROT 6.5  ALBUMIN 3.8    CBC:  Recent Labs Lab 10/04/15 0341 10/07/15 0217 10/08/15 0227  WBC 7.3 6.9 9.1  HGB 11.9* 11.6* 10.9*  HCT 36.8 35.4* 33.9*  MCV 87.4 86.8 86.9  PLT 164 196 202    Cardiac Enzymes:  Recent Labs Lab 10/02/15 1358 10/02/15 1942 10/03/15 1123  TROPONINI 11.54* 11.46* 7.51*    Coronary Stent Intervention 8/28 & 8/30 (combined note)  Left Heart Cath and Coronary Angiography     Ost RCA to Prox RCA lesion, 80 %stenosed followed by Prox RCA lesion, 100 % thrombotic stenosis.  PTCA followed by PCI with a STENT SYNERGY DES 2.75X32 drug eluting stent was successfully placed. Post intervention, there is a 0% residual stenosis.  Mid RCA lesion, 65 %stenosed - uncertain significance.- stable on relook cath.  Prox Cx to Mid Cx lesion, 40 %stenosed.  Prox LAD to Mid LAD bare metal stent,  40 %stenosed. Prox LAD lesion, 85 % in-stent restenosis --   A STENT SYNERGY DES 3X32 drug eluting stent was successfully placed, and overlaps previously placed stent.  Dist LAD bare-metal stent, 25 %stenosed.  LV function is normal. The left ventricular ejection fraction is 55-65% by visual estimate. No obvious wall motion abnormality  LV end diastolic pressure is low to normal. - 750 mL normal saline administered.  Tortuous innominate artery. Would recommend femoral access for catheterizations in the future.  Successful culprit lesion PCI of the proximal and ostial RCA lesions with a single DES stent covering both segments.  60-65% lesion in the mid RCA. - stable on relook Successful PCI of prox-mid LaD 85% lesion with a Synergy DES covering proximal portion of previous stent.           Abdominal and pelvic CT 10/05/2015: IMPRESSION: 1. No acute abnormality seen to explain the patient's symptoms. 2. Soft tissue inflammation at the right  inguinal region, of uncertain significance. 3. Marked chronic right renal atrophy, significantly worsened from 2009. 4 mm nonobstructing stone at the upper pole of the left kidney.  4. Trace left-sided pleural effusion, with associated atelectasis.  5. Mild coronary artery calcifications seen.  6. Mild scattered calcification along the abdominal aorta and its  branches.  Echocardiogram 10/02/2015: Study Conclusions - Left ventricle: The cavity size was normal. Wall thickness was  normal. Systolic function was normal. The estimated ejection   fraction was in the range of 60% to 65%. Wall motion was normal;   there were no regional wall motion abnormalities. - Aortic valve: There was mild regurgitation. - Right atrium: The atrium was mildly dilated. - Tricuspid valve: There was mild-moderate regurgitation.  Medications:   Scheduled Medications: . aspirin  81 mg Oral Daily  . atorvastatin  80 mg Oral q1800  . carvedilol  3.125 mg Oral BID WC  . clopidogrel  75 mg Oral Daily  . insulin aspart  0-15 Units Subcutaneous TID WC  . insulin aspart  0-5 Units Subcutaneous QHS  . pantoprazole  40 mg Oral Daily  . polyethylene glycol  17 g Oral Daily  . sodium chloride flush  3 mL Intravenous Q12H    Infusions: . sodium chloride Stopped (10/01/15 2200)    PRN Medications: sodium chloride, acetaminophen, albuterol, diazepam, fentaNYL (SUBLIMAZE) injection, HYDROcodone-acetaminophen, morphine injection, ondansetron (ZOFRAN) IV, sodium chloride flush, tiZANidine   Assessment / Plan:  Principal Problem:    ST elevation myocardial infarction (STEMI) of inferior wall, initial episode of care [facility]) Cardiac arrest with ventricular fibrillation (HCC) --  CAD S/P PCI with DES to RCA & LAD    S/p PCI to ost-prox RCA as culprit lesion followed by Staged PCI of pLAD for post-infarct angina (unstable Angina)  No further Angina pressure/discomfort & no CHF Sx - EF preserved by Echo (60-65%)  On ASA &  Plavix (converted from Brilinta 2/2 SOB)  On statin & low dose BB -- cannot titrate any further 2/2 borderline BP    Hyperlipidemia    Essential hypertension - not hypertensive here, borderline hypotensive, Unable to titrate BB or restart ARB    Diabetes mellitus type II, controlled ([place]) - excellent glycemic control on SSI, not requiring any coverage    Rib pain - s/p CPR -  with rib fractures documented. Discussed more consistent use of analgesics. Also heat pads.  Hard to sleep on bed lying flat 2/2 pain.  I do not think that this is Angina & should not keep  her in [place].  Will see if she can be transferred to [facility].  Deconditioning, working with PT and cardiac rehabilitation. Would like to walk more & stand up.  Also using IS ~20 x /hr    Discussion:    Discussed with patient and nursing. Will keep her in stepdown today until Tele bed available & will be able to transfer. Continue to work with PT and determine what she will need at discharge in terms of short-term rehabilitation or other support -- she would like to go home.  On aspirin, Plavix, Coreg, Lipitor, and PPI. She has been somewhat hesitant to ask for pain medications, would probably do better with more consistent use in light of rib fracture pain and need to increase her mobility. She is also using incentive spirometer.   Glenetta Hew, M.D., M.S. Interventional Cardiologist   Pager # [phone] Phone # [phone] [address] [place], [place] [address]

## 2015-10-08 NOTE — Progress Notes (Signed)
Pts CBG was 69 and asymptomatic. Refused Orange juice and graham crackers. Drank a protein shake and retested CBG. Second CBG was 78. Pt claimed that she avoids eating carbs and only eats vegetable, fruits, and proteins. Emphasized importance of eating some carbs to maintain blood sugar.  Basil Dess, RN

## 2015-10-08 NOTE — Progress Notes (Signed)
Patient's son Audry Pili is not at [phone]- this is someone else's number.  Percell Locus Tyronn Golda LCSWA [phone]

## 2015-10-09 DIAGNOSIS — E1122 Type 2 diabetes mellitus with diabetic chronic kidney disease: Secondary | ICD-10-CM | POA: Diagnosis not present

## 2015-10-09 DIAGNOSIS — E785 Hyperlipidemia, unspecified: Secondary | ICD-10-CM | POA: Diagnosis not present

## 2015-10-09 DIAGNOSIS — I10 Essential (primary) hypertension: Secondary | ICD-10-CM | POA: Diagnosis not present

## 2015-10-09 DIAGNOSIS — I2510 Atherosclerotic heart disease of native coronary artery without angina pectoris: Secondary | ICD-10-CM | POA: Diagnosis not present

## 2015-10-09 DIAGNOSIS — I469 Cardiac arrest, cause unspecified: Secondary | ICD-10-CM | POA: Diagnosis not present

## 2015-10-09 DIAGNOSIS — I2119 ST elevation (STEMI) myocardial infarction involving other coronary artery of inferior wall: Secondary | ICD-10-CM | POA: Diagnosis not present

## 2015-10-09 LAB — URINALYSIS, ROUTINE W REFLEX MICROSCOPIC
Bilirubin Urine: NEGATIVE
Glucose, UA: NEGATIVE mg/dL
Hgb urine dipstick: NEGATIVE
Ketones, ur: NEGATIVE mg/dL
Nitrite: NEGATIVE
Protein, ur: NEGATIVE mg/dL
Specific Gravity, Urine: 1.016 (ref 1.005–1.030)
pH: 5 (ref 5.0–8.0)

## 2015-10-09 LAB — GLUCOSE, CAPILLARY
Glucose-Capillary: 71 mg/dL (ref 65–99)
Glucose-Capillary: 76 mg/dL (ref 65–99)
Glucose-Capillary: 82 mg/dL (ref 65–99)
Glucose-Capillary: 74 mg/dL (ref 65–99)

## 2015-10-09 LAB — URINE MICROSCOPIC-ADD ON: RBC / HPF: NONE SEEN RBC/hpf (ref 0–5)

## 2015-10-09 MED ORDER — IRBESARTAN 75 MG PO TABS
37.5000 mg | ORAL_TABLET | Freq: Every day | ORAL | Status: DC
  Administered 2015-10-09 – 2015-10-11 (×3): 37.5 mg via ORAL
  Filled 2015-10-09 (×2): qty 1
  Filled 2015-10-09: qty 0.5
  Filled 2015-10-09: qty 1

## 2015-10-09 NOTE — Progress Notes (Signed)
Chaplain attempted spiritual care visit this morning in 2H Unit but she had been transferred to 3W 15. Upon arrival to the Unit the patient was having other medical assessment completed. Chaplain will follow as needed. [place] [phone]

## 2015-10-09 NOTE — Progress Notes (Signed)
Complained of difficulty of urination at times, NP made aware with order. Continue to monitor.

## 2015-10-09 NOTE — Progress Notes (Signed)
Patient Name: Tricia [name] Date of Encounter: 10/09/2015  Hospital Problem List     Principal Problem:   ST elevation myocardial infarction (STEMI) of inferior wall, initial episode of care [facility]) Active Problems:   Hyperlipidemia   Essential hypertension   CAD S/P percutaneous coronary angioplasty   Diabetes mellitus type II, controlled ([place])   Cardiac arrest with ventricular fibrillation ([place])   Rib pain - s/p CPR    Subjective   Continues to have intermittent retrosternal chest discomfort that is worse with deep breathing, coughing, and palpation.  Inpatient Medications    . aspirin  81 mg Oral Daily  . atorvastatin  80 mg Oral q1800  . carvedilol  3.125 mg Oral BID WC  . clopidogrel  75 mg Oral Daily  . insulin aspart  0-15 Units Subcutaneous TID WC  . insulin aspart  0-5 Units Subcutaneous QHS  . pantoprazole  40 mg Oral Daily  . polyethylene glycol  17 g Oral Daily  . sodium chloride flush  3 mL Intravenous Q12H    Vital Signs    Vitals:   10/08/15 2345 10/09/15 0000 10/09/15 0728 10/09/15 0730  BP: (!) 138/47  (!) 132/108   Pulse:   64 66  Resp:   18 17  Temp: 97.8 F (36.6 C)  98.3 F (36.8 C)   TempSrc: Oral  Oral   SpO2: 99% 98% 100%   Weight:      Height:        Intake/Output Summary (Last 24 hours) at 10/09/15 0845 Last data filed at 10/09/15 0400  Gross per 24 hour  Intake             1590 ml  Output             2025 ml  Net             -435 ml   Filed Weights   10/01/15 1543 10/01/15 1830  Weight: 195 lb (88.5 kg) 195 lb 1.7 oz (88.5 kg)    Physical Exam    GEN: Well nourished, well developed, in no acute distress.  HEENT: normal.  Neck: Supple, no JVD, carotid bruits, or masses. Cardiac: RRR, no murmurs, rubs, or gallops. No clubbing, cyanosis, edema.  Radials/DP/PT 2+ and equal bilaterally.  Respiratory:  Respirations regular and unlabored, diminished breath sounds bilat. GI: Soft, nontender, nondistended, BS + x 4. MS: no  deformity or atrophy. Skin: warm and dry, no rash. Neuro:  Strength and sensation are intact. Psych: Normal affect.  Labs    CBC  Recent Labs  10/07/15 0217 10/08/15 0227  WBC 6.9 9.1  HGB 11.6* 10.9*  HCT 35.4* 33.9*  MCV 86.8 86.9  PLT 196 [id number]   Basic Metabolic Panel  Recent Labs  10/07/15 0217 10/08/15 0227  NA 136 138  K 4.2 4.3  CL 108 109  CO2 18* 22  GLUCOSE 79 109*  BUN 24* 30*  CREATININE 1.11* 1.02*  CALCIUM 9.1 9.3    Telemetry    rsr  Radiology    No results found.  Patient Summary     70 y/o ? w/a h/o CAD, HTN, and HL, who presented to [facility] on 8[facility] with chest pain and inferior STEMI.  S/P PCI/DES to the occluded ost/prox RCA on admission with staged PCI/DES to the prox/mid LAD on 8/30.  Nl EF by echo.    Assessment & Plan    1.  Acute inferior STEMI, initial episode of care/CAD/VF Arrest:  S/p PCI/DES to the RCA on 8/28 with staged PCI/DES to the prox/mid LAD on 8/30.  Has rib pain but no angina.  Cont asa, statin,  blocker, and plavix.  EF 60-65% w/o rwma by echo.  Can tx to floor today.  Cont to work w/ PT/cardiac rehab.  2.  Hypertensive Heart Disease:  BP stable on  blocker.  Resume low dose of diovan (was on 320 @ home).  3.  HL:  LDL 64.  Cont high potency statin.  4.  DM II/Hypoglycemia:  On SSI.  She has had hypoglycemic events.  Has not required any insulin and is not on any baseline therapy either here or @ home.  5.  Rib Pain:  S/p CPR.  6.  Deconditioning:  Seen by PT with rec for 24 hr assistance/SNF  pt wishing to go home.  PT to see again today.  Tx to floor.  Signed, Murray Hodgkins NP    I have seen, examined and evaluated the patient this AM along with Mr. Sharolyn Douglas, [place].  After reviewing all the available data and chart, we discussed the patients laboratory, study & physical findings as well as symptoms in detail. I agree with his findings, examination as well as impression recommendations as per our discussion.    She  is exotropia well today. She still has her rib pain but no anginal pain. Was able to walk around yesterday without any difficulties. Is pretty insistent upon going home as opposed to a SNF - would like to have home health assistance. We are using today to have formal evaluation with PET to assess potential home health needs. Anticipate potential discharge tomorrow. Otherwise she is on a pretty good regimen. We are able to restart low dose of ARB today. Will be able to be back on her full dose as of yet. Try to wean down analgesics    Glenetta Hew, M.D., M.S. Interventional Cardiologist   Pager # [phone] Phone # [phone] [address] [place], [place] [address]

## 2015-10-09 NOTE — Progress Notes (Signed)
CARDIAC REHAB PHASE I   PRE:  Rate/Rhythm: 60 SR  BP:  Sitting: 136/68        SaO2: 96 RA  MODE:  Ambulation: 550 ft   POST:  Rate/Rhythm: 84 SR  BP:  Sitting: 151/57         SaO2: 99 RA  Pt ambulated 550 ft on RA, rolling walker, assist x1, very slow, fairly steady gait, tolerated fairly well with no complaints other than fatigue. Pt to recliner after walk, feet elevated, call bell within reach. Will follow.  [place], RN, BSN 10/09/2015 2:18 PM

## 2015-10-09 NOTE — Progress Notes (Signed)
Transferred to [place] room 15 by wheelchair, report given to RN, belongings with pt.

## 2015-10-09 NOTE — Evaluation (Signed)
Occupational Therapy Evaluation Patient Details Name: Tricia [name] MRN: [id number] DOB: 1945-06-14 Today's Date: 10/09/2015    History of Present Illness 70 y.o.femalew/ PMHx significant for CAD, HTN, hyperlipidemia, spinal stenosis with Lt leg radiculopathy>Rt, DM, osteoporosis, Lt TKR, adm 8/28/2017with complaints of chest pain. In ED, lost consciousness suddenly. Had CPR and found to be in v fib. She was defibrillated and EKG shows an inferior STEMI. CT chest shows Rt rib fractures 1-5 and Lt rib fractures 1-4. Pt reporting Lt scapular pain with no noted posterior rib fracture or scapular fx.   Clinical Impression   Pt admitted for the above diagnosis and has the deficits listed below. Pt would benefit from cont OT to increase independence with basic adls and adl mobility so she will be safe d/cing back home alone. Pt lives alone but should be okay to d/c home. Would recommend an aid for first week or two because of her pain level but feel she is safe.  Family will need to bring in food etc as she may not be driving initially.    Follow Up Recommendations  Home health OT;Other (comment);Supervision - Intermittent (aid for just first two weeks.)    Equipment Recommendations  3 in 1 bedside comode;Tub/shower seat;Other (comment) (commode she has to use and shower in different rooms)    Recommendations for Other Services       Precautions / Restrictions Precautions Precautions: Fall Precaution Comments: fractured ribs 1-5 and sternum Restrictions Weight Bearing Restrictions: No      Mobility Bed Mobility               General bed mobility comments: Patient has been sleeping in hospital recliner due to discomfort in bed  Transfers Overall transfer level: Needs assistance Equipment used: Rolling walker (2 wheeled) Transfers: Sit to/from Stand Sit to Stand: Min guard         General transfer comment: wide base of support and slight unsteadiness; tending to reach  out for UE support    Balance Overall balance assessment: History of Falls Sitting-balance support: Feet supported Sitting balance-Leahy Scale: Good     Standing balance support: During functional activity Standing balance-Leahy Scale: Good Standing balance comment: Balance tested only for short distances in her room.  Pt with no overt LOB                            ADL Overall ADL's : Needs assistance/impaired Eating/Feeding: Independent;Sitting   Grooming: Wash/dry face;Wash/dry hands;Oral care;Supervision/safety Grooming Details (indicate cue type and reason): Pt can brush hair but it is painful to do so in standing.  Does better in sitting where scapulas are supported.  Pt with long history of back pain so this has already been an issue for some time but this recent admission has made it worse. Upper Body Bathing: Set up;Sitting   Lower Body Bathing: Supervison/ safety;Sit to/from stand Lower Body Bathing Details (indicate cue type and reason): extra time given to bathe LE.  It is exhausting at this point for pt to completely bathe herself due to pain but in stages pt can do most of it with supervision. Upper Body Dressing : Minimal assistance;Sitting Upper Body Dressing Details (indicate cue type and reason): If pt wears a loose shirt she can get it over her head. Pt unable to fasten bra but does not always wear one. Lower Body Dressing: Minimal assistance;Sit to/from stand Lower Body Dressing Details (indicate cue type and reason):  Pt can doff socks and shoes bc she pulls her legs up to cross her knee by using her pants and socks.  Donning socks and shoes is harder.  With her sedentary lifestyle she does not have to wear socks and shoes too much and if she does she was taught to sit on the EOB and pull her legs up on the bed to donn socks. Toilet Transfer: Supervision/safety;Ambulation Toilet Transfer Details (indicate cue type and reason): Pt walked to bathroom and  toileted w/o assitive device with S. Toileting- Clothing Manipulation and Hygiene: Supervision/safety;Sit to/from stand   Tub/ Shower Transfer: Walk-in shower;Supervision/safety;Shower Scientist, research (medical) Details (indicate cue type and reason): Pt walked to bathrrom and stepped over small ledge to get into shower with S. Functional mobility during ADLs: Supervision/safety (for short distances in room.) General ADL Comments: Pt close to baseline functionally with adls but takes a great amount of time to do them b/c of pain and needing to take rest breaks.  Since pt has refused SNF, feel she would benefit from an aide just for first week or two with [place] but then most likely will be at her baseline.     Vision Vision Assessment?: No apparent visual deficits   Perception Perception Perception Tested?: No   Praxis Praxis Praxis tested?: Within functional limits    Pertinent Vitals/Pain Pain Assessment: 0-10 Pain Score: 4  Faces Pain Scale: Hurts little more Pain Location: anterior chest  Pain Descriptors / Indicators: Grimacing Pain Intervention(s): Monitored during session;Limited activity within patient's tolerance (declined to practice coughing with pillow splint)     Hand Dominance Right   Extremity/Trunk Assessment Upper Extremity Assessment Upper Extremity Assessment: RUE deficits/detail;LUE deficits/detail RUE Deficits / Details: able to flex shoulder to 90 and touch top of head with pain (Lt scapula)  RUE: Unable to fully assess due to pain LUE Deficits / Details: able to flex shoulder 70 degrees, full elbow/wrist/hand ROM LUE: Unable to fully assess due to pain   Lower Extremity Assessment Lower Extremity Assessment: Defer to PT evaluation   Cervical / Trunk Assessment Cervical / Trunk Assessment: Other exceptions Cervical / Trunk Exceptions: forward head; limited mobility due to pain   Communication Communication Communication: No difficulties   Cognition  Arousal/Alertness: Awake/alert Behavior During Therapy: WFL for tasks assessed/performed Overall Cognitive Status: Within Functional Limits for tasks assessed                     General Comments       Exercises   Other Exercises Other Exercises: Continued education on using BUEs for all tasks. Pt seems to be moving them better and now can reach top of head to wash hair etc.     Shoulder Instructions      Home Living Family/patient expects to be discharged to:: Private residence Living Arrangements: Alone Available Help at Discharge: Family;Available PRN/intermittently Type of Home: House Home Access: Stairs to enter Entrance Stairs-Number of Steps: 1 Entrance Stairs-Rails: None Home Layout: One level     Bathroom Shower/Tub: Walk-in shower;Door;Tub/shower unit Shower/tub characteristics: Door Bathroom Toilet: Handicapped height Bathroom Accessibility: No   Home Equipment: Grab bars - tub/shower;Shower seat - built in   Additional Comments: Pt states she uses a walk in shower with seat that only small enough to prop a leg onto.  If she had a usable seat, pt feels she could shower in this shower. Pt does not have grab bars in toilet area and would benefit from a 3:1 commode  over her commode.      Prior Functioning/Environment Level of Independence: Independent with assistive device(s)        Comments: limited by spinal stenosis; very sedentary and moves slowly (at times unable to get to bathroom in time due to back issues and wears absorptive garment)    OT Diagnosis: Generalized weakness;Acute pain   OT Problem List: Decreased strength;Decreased range of motion;Decreased knowledge of use of DME or AE;Pain   OT Treatment/Interventions: Self-care/ADL training;DME and/or AE instruction;Therapeutic activities    OT Goals(Current goals can be found in the care plan section) Acute Rehab OT Goals Patient Stated Goal: regain independence and return home OT Goal  Formulation: With patient Time For Goal Achievement: 10/16/15 Potential to Achieve Goals: Good ADL Goals Pt Will Perform Lower Body Dressing: with modified independence;sit to/from stand Pt Will Perform Tub/Shower Transfer: Shower transfer;ambulating;shower seat;with modified independence Additional ADL Goal #1: Pt will bathe self in shower on seat with distant supervision.  OT Frequency: Min 2X/week   Barriers to D/C:            Co-evaluation              End of Session Nurse Communication: Mobility status  Activity Tolerance: Patient tolerated treatment well Patient left: in chair;with call bell/phone within reach   Time: 1231-1259 OT Time Calculation (min): 28 min Charges:  OT General Charges $OT Visit: 1 Procedure OT Evaluation $OT Eval Moderate Complexity: 1 Procedure $OT Eval High Complexity: 1 Procedure OT Treatments $Self Care/Home Management : 8-22 mins G-Codes:    Glenford Peers Oct 27, 2015, 1:31 PM  [phone]

## 2015-10-09 NOTE — Care Management Note (Addendum)
Case Management Note  Patient Details  Name: Tricia [name] MRN: [id number] Date of Birth: 02-21-1945  Subjective/Objective:      CM following for progression and d/c planning.               Action/Plan: 10/09/2015 Met with pt re d/c planning. Pt asking about PACE for home services, this CM explained to pt that PACE services would be private pay as she does not have Medicaid. This pt then decided that this would not be appropriate for her , she also thought that all care would be provided in her home unaware that she would need to travel to [facility] center for care during the day and that no care is provided at night.  The pt then asked if she might qualify for CIR, this CM explained that this was not recommended by PT however we could ask for an evaluation. I also shared with the pt that given her diagnosis ,s/p cardiac arrest that the program would most likely be too aggressive. The pt would like to be evaluated for this services. CM will discuss with PT.  This pt states that she has been to a SNF twice before and really does not wish to go again, however she has no plan for assistance in the home other than [facility] services and she is aware of the copay required.   Expected Discharge Date:                  Expected Discharge Plan:  [place]  In-House Referral:     Discharge planning Services  CM Consult  Post Acute Care Choice:  Home Health Choice offered to:  Patient  DME Arranged:    DME Agency:     HH Arranged:  RN, Social Work, PT [place] Agency:  [place]  Status of Service:  Completed, signed off  If discussed at [employer], dates discussed:    Additional Comments:  Adron Bene, RN 10/09/2015, 2:25 PM

## 2015-10-09 NOTE — Consult Note (Signed)
   [facility] CM Inpatient Consult   10/09/2015  Tricia [name] 08-14-1945 [id number]   Patient was screened for [place] Management services as a benefit of the [employer] plan. Patient was admitted on 10/01/15 with chest pain and was to have an acute inferior STEMI.  HX of DM II.     Met with the patient regarding the benefits of [place] Management services.  Explained that [place] Management is a covered benefit of insurance. Review information for [facility] Care Management and a folder was provided with contact information.  Patient states she does not want to go to a rehab facility states, "I've been to a couple of those and the people are there just for the paycheck, in general."  Patient expresses that she does not need any extra co-pays.  Explained that [place] Management is a covered benefit with no additional cost and it does not interfere with or replace any services arranged by the inpatient care management staff.  Patient states, "I will take your brochure and look over it."  Patient acknowledged the contact information on the card and states she will call if she has a need.  For questions or follow up, please contact:  Natividad Brood, RN BSN [facility] Liaison  [phone] business mobile phone Toll free office [phone]

## 2015-10-09 NOTE — Progress Notes (Signed)
Physical Therapy Treatment Patient Details Name: Tricia [name] MRN: [id number] DOB: Feb 01, 1946 Today's Date: 10/09/2015    History of Present Illness 70 y.o.femalew/ PMHx significant for CAD, HTN, hyperlipidemia, spinal stenosis with Lt leg radiculopathy>Rt, DM, osteoporosis, Lt TKR, adm 8/28/2017with complaints of chest pain. In ED, lost consciousness suddenly. Had CPR and found to be in v fib. She was defibrillated and EKG shows an inferior STEMI. CT chest shows Rt rib fractures 1-5 and Lt rib fractures 1-4. Pt reporting Lt scapular pain with no noted posterior rib fracture or scapular fx.    PT Comments    Making very good progress with activity tolerance; still pain is limiting her ability to use bilateral UEs, very much impacting her ability to complete ADLs (please see also OT consult); Discussed case with Malachy Mood, Case Mgr -- I agree it is unlikely she will qualify for CIR, still, will place a CIR screen as pt would like to be evaluated;   If maximum HHservices are cost-prohibitive for Ms. Dollard, it is still worth considering SNF for rehab to maximize independence and safety with mobility prior to dc home alone;   Follow Up Recommendations  Home health PT (pt is declining SNF placement; will need to max [facility] services)  SW consult for Meals on Wheels service   Equipment Recommendations  Other (comment) (Rollator RW)    Recommendations for Other Services       Precautions / Restrictions Precautions Precautions: Fall Precaution Comments: fractured ribs 1-5 and sternum Restrictions Weight Bearing Restrictions: No    Mobility  Bed Mobility               General bed mobility comments: Patient has been sleeping in hospital recliner due to discomfort in bed  Transfers Overall transfer level: Needs assistance Equipment used: Rolling walker (2 wheeled) Transfers: Sit to/from Stand Sit to Stand: Min guard         General transfer comment: wide base of support and slight  unsteadiness; tending to reach out for UE support  Ambulation/Gait Ambulation/Gait assistance: Min guard Ambulation Distance (Feet): 200 Feet Assistive device: Rolling walker (2 wheeled) Gait Pattern/deviations: Step-through pattern     General Gait Details: Cues to self-monitor for activity tolerance; Overall good RW use   Stairs            Wheelchair Mobility    Modified Rankin (Stroke Patients Only)       Balance Overall balance assessment: History of Falls Sitting-balance support: Feet supported Sitting balance-Leahy Scale: Good     Standing balance support: During functional activity Standing balance-Leahy Scale: Good Standing balance comment: Balance tested only for short distances in her room.  Pt with no overt LOB                    Cognition Arousal/Alertness: Awake/alert Behavior During Therapy: WFL for tasks assessed/performed Overall Cognitive Status: Within Functional Limits for tasks assessed                      Exercises Other Exercises Other Exercises: Continued education on using BUEs for all tasks. Pt seems to be moving them better and now can reach top of head to wash hair etc.      General Comments        Pertinent Vitals/Pain Pain Assessment: 0-10 Pain Score: 4  Faces Pain Scale: Hurts little more Pain Location: anterior chest  Pain Descriptors / Indicators: Grimacing Pain Intervention(s): Monitored during session;Limited activity within patient's tolerance (  declined to practice coughing with pillow splint)    Home Living Family/patient expects to be discharged to:: Private residence Living Arrangements: Alone Available Help at Discharge: Family;Available PRN/intermittently Type of Home: House Home Access: Stairs to enter Entrance Stairs-Rails: None Home Layout: One level Home Equipment: Grab bars - tub/shower;Shower seat - built in Additional Comments: Pt states she uses a walk in shower with seat that only  small enough to prop a leg onto.  If she had a usable seat, pt feels she could shower in this shower. Pt does not have grab bars in toilet area and would benefit from a 3:1 commode over her commode.    Prior Function Level of Independence: Independent with assistive device(s)      Comments: limited by spinal stenosis; very sedentary and moves slowly (at times unable to get to bathroom in time due to back issues and wears absorptive garment)   PT Goals (current goals can now be found in the care plan section) Acute Rehab PT Goals Patient Stated Goal: regain independence and return home PT Goal Formulation: With patient Time For Goal Achievement: 10/20/15 Potential to Achieve Goals: Good Progress towards PT goals: Progressing toward goals    Frequency  Min 3X/week    PT Plan Discharge plan needs to be updated (pt is declining SNF)    Co-evaluation             End of Session Equipment Utilized During Treatment: Gait belt Activity Tolerance: Patient tolerated treatment well Patient left: in chair;with call bell/phone within reach     Time: 1033-1105 PT Time Calculation (min) (ACUTE ONLY): 32 min  Charges:  $Gait Training: 23-37 mins                    G Codes:      Roney Marion Hamff 10/09/2015, 3:07 PM  Roney Marion, [place] Pager [phone] Office [phone]

## 2015-10-09 NOTE — Care Management Important Message (Signed)
Important Message  Patient Details  Name: Tricia [name] MRN: [id number] Date of Birth: 1945-12-09   Medicare Important Message Given:  Yes    Nathen May 10/09/2015, 5:05 PM

## 2015-10-10 ENCOUNTER — Telehealth: Payer: Self-pay | Admitting: Cardiovascular Disease

## 2015-10-10 DIAGNOSIS — I2119 ST elevation (STEMI) myocardial infarction involving other coronary artery of inferior wall: Secondary | ICD-10-CM | POA: Diagnosis not present

## 2015-10-10 DIAGNOSIS — N181 Chronic kidney disease, stage 1: Secondary | ICD-10-CM

## 2015-10-10 DIAGNOSIS — N390 Urinary tract infection, site not specified: Secondary | ICD-10-CM

## 2015-10-10 DIAGNOSIS — I469 Cardiac arrest, cause unspecified: Secondary | ICD-10-CM | POA: Diagnosis not present

## 2015-10-10 DIAGNOSIS — E1122 Type 2 diabetes mellitus with diabetic chronic kidney disease: Secondary | ICD-10-CM | POA: Diagnosis not present

## 2015-10-10 DIAGNOSIS — I2510 Atherosclerotic heart disease of native coronary artery without angina pectoris: Secondary | ICD-10-CM | POA: Diagnosis not present

## 2015-10-10 LAB — GLUCOSE, CAPILLARY
Glucose-Capillary: 88 mg/dL (ref 65–99)
Glucose-Capillary: 98 mg/dL (ref 65–99)
Glucose-Capillary: 98 mg/dL (ref 65–99)
Glucose-Capillary: 94 mg/dL (ref 65–99)

## 2015-10-10 MED ORDER — CIPROFLOXACIN HCL 500 MG PO TABS
500.0000 mg | ORAL_TABLET | Freq: Two times a day (BID) | ORAL | Status: DC
  Administered 2015-10-10 – 2015-10-11 (×3): 500 mg via ORAL
  Filled 2015-10-10 (×3): qty 1

## 2015-10-10 MED ORDER — FUROSEMIDE 10 MG/ML IJ SOLN
40.0000 mg | Freq: Once | INTRAMUSCULAR | Status: AC
  Administered 2015-10-10: 40 mg via INTRAVENOUS
  Filled 2015-10-10: qty 4

## 2015-10-10 NOTE — Progress Notes (Signed)
1440 Offered to walk with pt. Declined at this time. Stated will walk later. Assisted her to put feet up on chair. Will continue to follow. Graylon Good RN BSN 10/10/2015 2:40 PM

## 2015-10-10 NOTE — Telephone Encounter (Signed)
New message    TOC appt in  7 days per Ignacia Bayley with [facility].

## 2015-10-10 NOTE — Progress Notes (Signed)
Rehab Admissions Coordinator Note:  Patient was screened by Retta Diones for appropriateness for an Inpatient Acute Rehab Consult. Noted PT/OT recommending Home Health for follow-up.   At this time, we are recommending HH.  However, if MD prefers inpatient rehab, can order consult.  Patient is doing well and insurance carrier may not authorize an acute inpatient rehab stay.  Call me for questions.  Retta Diones 10/10/2015, 1:37 PM  I can be reached at [phone].

## 2015-10-10 NOTE — Progress Notes (Signed)
Pt requested to discuss SNF with CSW.  CSW met with pt to discuss- pt states that she has strong preference for CIR- CSW explained that it did not appear that pt is a candidate for this program but pt continues to be insistent it be looked into.  CSW explained that SNF would be facility outside the hospital and informed that she likely would not be approved to stay very long if she did receive prior approval from insurance (pt walking 548f with cardiac rehab and reports no concerns with mobility other than showering)  Pt states she was only asking because of her childrens concerns with her return home.  CSW offered to provide SNF options to patient but she declined and stated that she would want to go to CIR or home- requests to speak with RNCM further about home health options- RNCM informed  JJorge Ny LHighlandSocial Worker [phone]

## 2015-10-10 NOTE — Progress Notes (Signed)
Physical Therapy Treatment Patient Details Name: Tricia [name] MRN: [id number] DOB: 1945-11-13 Today's Date: 10/10/2015    History of Present Illness 70 y.o.femalew/ PMHx significant for CAD, HTN, hyperlipidemia, spinal stenosis with Lt leg radiculopathy>Rt, DM, osteoporosis, Lt TKR, adm 8/28/2017with complaints of chest pain. In ED, lost consciousness suddenly. Had CPR and found to be in v fib. She was defibrillated and EKG shows an inferior STEMI. CT chest shows Rt rib fractures 1-5 and Lt rib fractures 1-4. Pt reporting Lt scapular pain with no noted posterior rib fracture or scapular fx.    PT Comments    Pt progressing with mobility, ambulated 500' with RW and min-guard A, would most likely not quialify for CIR based on this. Pt educated in consistent use of RW for safety as well as motivation to increase overall activity level at home. Pt very sedentary. PT will continue to follow.   Follow Up Recommendations  Home health PT     Equipment Recommendations  Other (comment) (rollator with seat)    Recommendations for Other Services       Precautions / Restrictions Precautions Precautions: Fall Precaution Comments: fractured ribs 1-5 and sternum Restrictions Weight Bearing Restrictions: No    Mobility  Bed Mobility               General bed mobility comments: pt received in chair and she plans to sleep in recliner when she gets home due to pain when lying flat  Transfers Overall transfer level: Needs assistance Equipment used: Rolling walker (2 wheeled) Transfers: Sit to/from Stand Sit to Stand: Supervision         General transfer comment: stable to RW  Ambulation/Gait Ambulation/Gait assistance: Min guard Ambulation Distance (Feet): 500 Feet Assistive device: Rolling walker (2 wheeled) Gait Pattern/deviations: Step-through pattern;Decreased stride length;Trunk flexed Gait velocity: decreased Gait velocity interpretation: Below normal speed for  age/gender General Gait Details: vc's for erect posture and outward gaze. Pt reports occasional dizziness with changes in head position, discussed gaze stabilization, pt voiced understanding   Stairs Stairs:  (pt has only a threshold)          Wheelchair Mobility    Modified Rankin (Stroke Patients Only)       Balance Overall balance assessment: History of Falls;Needs assistance Sitting-balance support: No upper extremity supported Sitting balance-Leahy Scale: Good     Standing balance support: No upper extremity supported Standing balance-Leahy Scale: Fair Standing balance comment: increased reaction time to perturbation, pt tends to leave RW and walk short distances without, recommend consistent use of RW                    Cognition Arousal/Alertness: Awake/alert Behavior During Therapy: WFL for tasks assessed/performed Overall Cognitive Status: Within Functional Limits for tasks assessed                      Exercises      General Comments General comments (skin integrity, edema, etc.): spoke with RN, Merrily Pew, about pt ambulating in hallway with RW to increase activity level. Also spoke with her about increasing activity level at home. She mobilizes little in part due to chronic back and LE pain      Pertinent Vitals/Pain Pain Assessment: Faces Faces Pain Scale: Hurts little more Pain Location: torso Pain Descriptors / Indicators: Grimacing Pain Intervention(s): Limited activity within patient's tolerance;Monitored during session    Home Living  Prior Function            PT Goals (current goals can now be found in the care plan section) Acute Rehab PT Goals Patient Stated Goal: regain independence and return home PT Goal Formulation: With patient Time For Goal Achievement: 10/20/15 Potential to Achieve Goals: Good Progress towards PT goals: Progressing toward goals    Frequency  Min 3X/week    PT Plan  Current plan remains appropriate    Co-evaluation             End of Session Equipment Utilized During Treatment: Gait belt Activity Tolerance: Patient tolerated treatment well Patient left: in chair;with call bell/phone within reach     Time: 1039-1103 PT Time Calculation (min) (ACUTE ONLY): 24 min  Charges:  $Gait Training: 23-37 mins                    G Codes:     Leighton Roach, PT  Acute Rehab Services  [phone]  Leighton Roach 10/10/2015, 1:42 PM

## 2015-10-10 NOTE — Progress Notes (Addendum)
Patient Name: Tricia [name] Date of Encounter: 10/10/2015  Hospital Problem List     Principal Problem:   ST elevation myocardial infarction (STEMI) of inferior wall, initial episode of care [facility]) Active Problems:   CAD S/P percutaneous coronary angioplasty   Cardiac arrest with ventricular fibrillation ([place])   Essential hypertension   Diabetes mellitus type II, controlled ([name])   Rib pain - s/p CPR   Hyperlipidemia   UTI (urinary tract infection)    Subjective   Continues to have rib pain.  No angina.  Did better with PT yesterday.  Has noted lower ext swelling and wt gain.  Denies dyspnea.  Inpatient Medications    . aspirin  81 mg Oral Daily  . atorvastatin  80 mg Oral q1800  . carvedilol  3.125 mg Oral BID WC  . ciprofloxacin  500 mg Oral BID  . clopidogrel  75 mg Oral Daily  . furosemide  40 mg Intravenous Once  . insulin aspart  0-15 Units Subcutaneous TID WC  . insulin aspart  0-5 Units Subcutaneous QHS  . irbesartan  37.5 mg Oral Daily  . pantoprazole  40 mg Oral Daily  . polyethylene glycol  17 g Oral Daily  . sodium chloride flush  3 mL Intravenous Q12H    Vital Signs    Vitals:   10/09/15 1004 10/09/15 1439 10/09/15 2058 10/10/15 0614  BP: (!) 145/68  (!) 119/52 (!) 115/54  Pulse: 70 69 72 61  Resp: 16 15 16 16   Temp: 97.7 F (36.5 C) 97.8 F (36.6 C) 98.1 F (36.7 C) 97.8 F (36.6 C)  TempSrc: Oral Oral Oral Oral  SpO2: 100% 100% 100% 95%  Weight:    206 lb 6.4 oz (93.6 kg)  Height:        Intake/Output Summary (Last 24 hours) at 10/10/15 1239 Last data filed at 10/09/15 2059  Gross per 24 hour  Intake              120 ml  Output              800 ml  Net             -680 ml   Filed Weights   10/01/15 1543 10/01/15 1830 10/10/15 0614  Weight: 195 lb (88.5 kg) 195 lb 1.7 oz (88.5 kg) 206 lb 6.4 oz (93.6 kg)    Physical Exam    GEN: Well nourished, well developed, in no acute distress.  HEENT: normal.  Neck: Supple, no JVD, carotid  bruits, or masses. Cardiac: RRR, no murmurs, rubs, or gallops. No clubbing, cyanosis, trace bilat LE edema.  Radials/DP/PT 2+ and equal bilaterally.  Respiratory:  Respirations regular and unlabored, bibasilar crackles. GI: Soft, nontender, nondistended, BS + x 4. MS: no deformity or atrophy. Skin: warm and dry, no rash. Neuro:  Strength and sensation are intact. Psych: Normal affect.  Labs    CBC  Recent Labs  10/08/15 0227  WBC 9.1  HGB 10.9*  HCT 33.9*  MCV 86.9  PLT 123XX123   Basic Metabolic Panel  Recent Labs  10/08/15 0227  NA 138  K 4.3  CL 109  CO2 22  GLUCOSE 109*  BUN 30*  CREATININE 1.02*  CALCIUM 9.3   Telemetry    RSR  Radiology    No results found.  Patient Summary     70 y/o ? w/a h/o CAD, HTN, and HL, who presented to [facility] on 8/28 with chest pain and  inferior STEMI.  S/P PCI/DES to the occluded ost/prox RCA on admission with staged PCI/DES to the prox/mid LAD on 8/30.  Nl EF by echo.  Tx to floor 9/4.  Developed UTI.  Still deconditioned - refuses SNF, prefers home with supervision.  Mild volume overload 9/6.  Assessment & Plan    1.  Acute inferior STEMI, initial episode of care/CAD/VF Arrest:  S/p PCI/DES to the RCA on 8/28 with staged PCI/DES to the prox/mid LAD on 8/30.  Has rib pain but no angina.  Cont asa, statin,  blocker, and plavix.  EF 60-65% w/o rwma by echo.  Cont to work w/ PT/cardiac rehab - hopefully home in AM.  2.  Hypertensive Heart Disease:  BP stable on  blocker and ARB.  She says that she is concerned that her BP fluctuates and that she wouldn't know when to hold meds @ home and wondered if she'd need a device that could transmit her BP's to either our office or home health.  I've reviewed her BPs and MAR.  With the exception of one dose of coreg three days ago, she has received all scheduled antihypertensives, including irbesartan, which was started yesterday.  It does not appear that she should require any remote monitoring  of her BP @ home.  3.  HL:  LDL 64.  Cont high potency statin.  4.  DM II/Hypoglycemia:  On SSI.  No hypoglycemia yesterday.  Has not required any insulin and is not on any baseline therapy either here or @ home.  5.  Rib Pain:  S/p CPR.  Oral analgesics.  6.  Deconditioning:  Did better with PT yesterday.  Rec for HHPT and meals on wheels.    7.  Mild volume overload:  Pt has noted increasing lower ext swelling.  Nl EF.  She does have crackles and trace edema on exam.  Will give one dose of IV lasix today.  8.  UTI:  Urine culture grew out E coli.  Will add cipro.  9.  Dispo:  Gentle diuresis today.  Continue PT.  Hopefully home tomorrow.   Signed, Tricia Hodgkins NP   I have seen, examined and evaluated the patient this PM along with Mr. Tricia Douglas, NP.  After reviewing all the available data and chart, we discussed the patients laboratory, study & physical findings as well as symptoms in detail. I agree with his findings, examination as well as impression recommendations as per our discussion.    She is progressing slowly. Becoming more functional. Difficult situation with figuring out her disposition as far as her ability to go home with home health versus skilled nursing facility. I think that she can probably be ready by tomorrow or the next day.  She has a little bit of crackles on exam so we will give her dose of Lasix a day and probably give her a dose of at least 20-40 mg of Lasix to take when necessary for swelling or increased dyspnea.  We finally have a bacteria from her urine cultures, we will now treat with Cipro. Probably 3-5 days.  Otherwise she is pretty much on a stable dose of carvedilol and Avapro. Glycemic control is been relatively stable. On high-dose statin. Aspirin plus Plavix.  Anticipate discharge home in 1-2 days.  We'll try to arrange [facility] outpt and follow-up to help with weight, blood pressure, and symptom monitoring.     Tricia [name], M.D.,  M.S. Interventional Cardiologist   Pager # [phone] Phone # [phone]  [address] [place], [place] [address]

## 2015-10-11 ENCOUNTER — Encounter (HOSPITAL_COMMUNITY): Payer: Self-pay | Admitting: Nurse Practitioner

## 2015-10-11 ENCOUNTER — Telehealth: Payer: Self-pay | Admitting: Family Medicine

## 2015-10-11 ENCOUNTER — Other Ambulatory Visit: Payer: Self-pay

## 2015-10-11 DIAGNOSIS — I110 Hypertensive heart disease with heart failure: Secondary | ICD-10-CM

## 2015-10-11 DIAGNOSIS — N3000 Acute cystitis without hematuria: Secondary | ICD-10-CM

## 2015-10-11 DIAGNOSIS — E1122 Type 2 diabetes mellitus with diabetic chronic kidney disease: Secondary | ICD-10-CM | POA: Diagnosis not present

## 2015-10-11 DIAGNOSIS — I119 Hypertensive heart disease without heart failure: Secondary | ICD-10-CM | POA: Diagnosis present

## 2015-10-11 DIAGNOSIS — R269 Unspecified abnormalities of gait and mobility: Secondary | ICD-10-CM | POA: Diagnosis not present

## 2015-10-11 DIAGNOSIS — I2119 ST elevation (STEMI) myocardial infarction involving other coronary artery of inferior wall: Secondary | ICD-10-CM | POA: Diagnosis not present

## 2015-10-11 DIAGNOSIS — E785 Hyperlipidemia, unspecified: Secondary | ICD-10-CM | POA: Diagnosis not present

## 2015-10-11 DIAGNOSIS — I2510 Atherosclerotic heart disease of native coronary artery without angina pectoris: Secondary | ICD-10-CM | POA: Diagnosis not present

## 2015-10-11 DIAGNOSIS — I10 Essential (primary) hypertension: Secondary | ICD-10-CM | POA: Diagnosis not present

## 2015-10-11 DIAGNOSIS — I469 Cardiac arrest, cause unspecified: Secondary | ICD-10-CM | POA: Diagnosis not present

## 2015-10-11 LAB — URINE CULTURE: Culture: >=100000 — AB

## 2015-10-11 LAB — GLUCOSE, CAPILLARY
Glucose-Capillary: 95 mg/dL (ref 65–99)
Glucose-Capillary: 106 mg/dL — ABNORMAL HIGH (ref 65–99)

## 2015-10-11 MED ORDER — CLOPIDOGREL BISULFATE 75 MG PO TABS: 75.0000 mg | ORAL_TABLET | Freq: Every day | ORAL | 6 refills | Status: DC

## 2015-10-11 MED ORDER — FUROSEMIDE 20 MG PO TABS
20.0000 mg | ORAL_TABLET | Freq: Every day | ORAL | Status: DC
  Administered 2015-10-11: 20 mg via ORAL
  Filled 2015-10-11: qty 1

## 2015-10-11 MED ORDER — CARVEDILOL 3.125 MG PO TABS: 3.1250 mg | ORAL_TABLET | Freq: Two times a day (BID) | ORAL | 6 refills | Status: DC

## 2015-10-11 MED ORDER — BISACODYL 5 MG PO TBEC
5.0000 mg | DELAYED_RELEASE_TABLET | Freq: Every day | ORAL | Status: DC | PRN
  Administered 2015-10-11: 5 mg via ORAL
  Filled 2015-10-11: qty 1

## 2015-10-11 MED ORDER — HYDROCODONE-ACETAMINOPHEN 5-325 MG PO TABS
1.0000 | ORAL_TABLET | Freq: Four times a day (QID) | ORAL | 0 refills | Status: DC | PRN
Start: 2015-10-11 — End: 2015-10-16

## 2015-10-11 MED ORDER — ATORVASTATIN CALCIUM 80 MG PO TABS: 80.0000 mg | ORAL_TABLET | Freq: Every day | ORAL | 6 refills | Status: DC

## 2015-10-11 MED ORDER — NITROGLYCERIN 0.4 MG/SPRAY TL SOLN: 1.0000 | 3 refills | Status: DC | PRN

## 2015-10-11 MED ORDER — PANTOPRAZOLE SODIUM 40 MG PO TBEC: 40.0000 mg | DELAYED_RELEASE_TABLET | Freq: Every day | ORAL | 6 refills | Status: DC

## 2015-10-11 MED ORDER — VALSARTAN 40 MG PO TABS: 40.0000 mg | ORAL_TABLET | Freq: Every day | ORAL | 6 refills | Status: DC

## 2015-10-11 MED ORDER — FUROSEMIDE 20 MG PO TABS: 20.0000 mg | ORAL_TABLET | Freq: Every day | ORAL | 1 refills | Status: DC

## 2015-10-11 MED ORDER — CIPROFLOXACIN HCL 500 MG PO TABS: 500.0000 mg | ORAL_TABLET | Freq: Two times a day (BID) | ORAL | 0 refills | Status: DC

## 2015-10-11 NOTE — Telephone Encounter (Signed)
She was given #20 pills- I would advise her to only take as prescribed- 1 at a time. This was only given to her today 10/11/15. I would be happy to see her on Monday or Tuesday of next week to follow up to be given more pain medicine as needed but likely would be low # as well- as the hope is this would only be short term and weaning as day sgo on past this. She can also try heat or ice over ribs.   For dry mouth- would advise her to keep water near her and take light amount but frequent sips. Could also try something like oragel.

## 2015-10-11 NOTE — Plan of Care (Signed)
Problem: Pain Managment: Goal: General experience of comfort will improve Outcome: Completed/Met Date Met: 10/11/15 Pt general experience has improved

## 2015-10-11 NOTE — Care Management (Addendum)
Case Management Note Initial Note Started By Jasmine Pang Case Manager 10-09-15 Patient Details  Name: Tricia [name] MRN: [id number] Date of Birth: Jul 31, 1945  Subjective/Objective:      CM following for progression and d/c planning.               Action/Plan: 10/09/2015 Met with pt re d/c planning. Pt asking about PACE for home services, this CM explained to pt that PACE services would be private pay as she does not have Medicaid. This pt then decided that this would not be appropriate for her , she also thought that all care would be provided in her home unaware that she would need to travel to [facility] center for care during the day and that no care is provided at night.  The pt then asked if she might qualify for CIR, this CM explained that this was not recommended by PT however we could ask for an evaluation. I also shared with the pt that given her diagnosis ,s/p cardiac arrest that the program would most likely be too aggressive. The pt would like to be evaluated for this services. CM will discuss with PT.  This pt states that she has been to a SNF twice before and really does not wish to go again, however she has no plan for assistance in the home other than [facility] services and she is aware of the copay required.   Expected Discharge Date:                                   Expected Discharge Plan:  [place]  In-House Referral:     Discharge planning Services  CM Consult  Post Acute Care Choice:  Home Health, Durable Medical Equipment Choice offered to:  Patient  DME Arranged:  Rollator, Civil engineer, contracting DME Agency:   [place] Arranged:  RN, Aide, PT [facility] Agency:  [place]  Status of Service:  Completed, signed off  If discussed at [place] of Stay Meetings, dates discussed:    Additional Comments: 1059 10-11-15  Jacqlyn Krauss, RN,BSN  CM did speak with pt in regards to disposition needs- Pt will have a co pay for each  service that is provided. Choice had been provided by previous CM.  Pt did narrow it down to RN for disease and medication management- BP monitoring, PT- safety evaluation and treatment and Aide Services via [facility]. DME ordered via [facility] as well: Medical illustrator. Referral called to Santiago Glad with [facility]. SOC to begin within 24-48 hours post d/c. Pt is home alone, however son will be nearby and grandson will be in and out to check on her. Pt is agreeable to [place]. [facility] to assist with possible Meals on Wheels and other community resources. CM did speak with pt in regards to [facility]. She is willing to let them call her and they discuss services. CM will make referral to [facility] with [facility]. Son to pick the patient up for transport home. No further needs from CM at this time.

## 2015-10-11 NOTE — Care Management Important Message (Signed)
Important Message  Patient Details  Name: Tricia [name] MRN: [id number] Date of Birth: Oct 12, 1945   Medicare Important Message Given:  Yes    Nathen May 10/11/2015, 10:46 AM

## 2015-10-11 NOTE — Discharge Instructions (Signed)
**  PLEASE REMEMBER TO BRING ALL OF YOUR MEDICATIONS TO EACH OF YOUR FOLLOW-UP OFFICE VISITS. ° °NO HEAVY LIFTING X 4 WEEKS. °NO SEXUAL ACTIVITY X 4 WEEKS. °NO DRIVING X 2 WEEKS. °NO SOAKING BATHS, HOT TUBS, POOLS, ETC., X 7 DAYS. ° ° °

## 2015-10-11 NOTE — Progress Notes (Signed)
Patient Name: Tricia [name] Date of Encounter: 10/11/2015  Hospital Problem List     Principal Problem:   ST elevation myocardial infarction (STEMI) of inferior wall, initial episode of care [facility]) Active Problems:   CAD S/P percutaneous coronary angioplasty   Cardiac arrest with ventricular fibrillation ([place])   Essential hypertension   Diabetes mellitus type II, controlled ([place])   Rib pain - s/p CPR   Hyperlipidemia   UTI (urinary tract infection)    Subjective   Chest wall pain improving.  Ambulated yesterday with PT - did well.  Lower ext swelling slightly better.  Still up ~ 7 lbs if admission wt accurate.  Inpatient Medications    . aspirin  81 mg Oral Daily  . atorvastatin  80 mg Oral q1800  . carvedilol  3.125 mg Oral BID WC  . ciprofloxacin  500 mg Oral BID  . clopidogrel  75 mg Oral Daily  . furosemide  20 mg Oral Daily  . insulin aspart  0-15 Units Subcutaneous TID WC  . insulin aspart  0-5 Units Subcutaneous QHS  . irbesartan  37.5 mg Oral Daily  . pantoprazole  40 mg Oral Daily  . polyethylene glycol  17 g Oral Daily  . sodium chloride flush  3 mL Intravenous Q12H    Vital Signs    Vitals:   10/10/15 1402 10/10/15 2131 10/11/15 0423 10/11/15 0934  BP: (!) 127/58 (!) 120/48 (!) 111/53 100/72  Pulse: 63 74 66 (!) 58  Resp: 18 15 16 16   Temp: 98.2 F (36.8 C) 98.4 F (36.9 C) 97.5 F (36.4 C) 97.6 F (36.4 C)  TempSrc: Oral Oral Oral Oral  SpO2: 100% 95% 100% 95%  Weight:   202 lb 8 oz (91.9 kg)   Height:        Intake/Output Summary (Last 24 hours) at 10/11/15 1025 Last data filed at 10/11/15 0829  Gross per 24 hour  Intake              960 ml  Output             2875 ml  Net            -1915 ml   Filed Weights   10/01/15 1830 10/10/15 0614 10/11/15 0423  Weight: 195 lb 1.7 oz (88.5 kg) 206 lb 6.4 oz (93.6 kg) 202 lb 8 oz (91.9 kg)    Physical Exam    GEN: Well nourished, well developed, in no acute distress.  HEENT: normal.  Neck:  Supple, no JVD, carotid bruits, or masses. Cardiac: RRR, no murmurs, rubs, or gallops. No clubbing, cyanosis, 1+ bilat LE edema.  Radials/DP/PT 2+ and equal bilaterally.    Respiratory:  Respirations regular and unlabored, few basilar crackles. GI: Soft, nontender, nondistended, BS + x 4. MS: no deformity or atrophy. Skin: warm and dry, no rash. Neuro:  Strength and sensation are intact. Psych: Normal affect.  Labs    No Labs  Telemetry    RSR  Radiology    No results found.  Patient Summary     70 y/o ?w/a h/o CAD, HTN, and HL, who presented to [facility] on 8/28 with chest pain and inferior STEMI. S/P PCI/DES to the occluded ost/prox RCA on admission with staged PCI/DES to the prox/mid LAD on 8/30. Nl EF by echo. Tx to floor 9/4.  Developed UTI.  Mild volume overload 9/6.  Seen by PT with improved activity tolerance and rec for HHPT.  Assessment &  Plan    1. Acute inferior STEMI, initial episode of care/CAD/VF Arrest: S/p PCI/DES to the RCA on 8/28 with staged PCI/DES to the prox/mid LAD on 8/30. Has rib pain, which has improved.  No angina. Cont asa, statin,  blocker, and plavix. EF 60-65% w/o rwma by echo. Plan d/c today with f/u next week.  Outpt cardiac rehab.  2. Hypertensive Heart Disease: BP stable on  blocker and ARB.  She has been receiving all doses as scheduled - she was previously concerned about BP fluctuations.  CM has arranged for HH BP monitoring through [place].  3. HL: LDL 64. Cont high potency statin.  4. DM II/Hypoglycemia: On SSI. No hypoglycemia yesterday. Has not required any insulin and is not on any baseline therapy either here or @ home.  5. Rib Pain: S/p CPR.  Oral analgesics.  6. Deconditioning: Did better with PT yesterday.  Rec for HHPT/OT and meals on wheels.    7.  Mild volume overload:  Pt has noted increasing lower ext swelling on 9/6 and given a dose of lasix.  Minus 2.1L on 9/6 and wt down 4 lbs.  Nl EF.   Still mildly edematous.  Will add short course of lasix 20 mg daily.  Will need f/u bmet @ office f/u.  8.  UTI:  Urine culture grew out E coli.  Cipro added 9.6.  Plan to complete 7 day course.  Signed, Murray Hodgkins NP   I have seen, examined and evaluated the patient this AM along with Mr. Sharolyn Douglas, NP-C.  After reviewing all the available data and chart, we discussed the patients laboratory, study & physical findings as well as symptoms in detail. I agree with her findings, examination as well as impression recommendations as per our discussion.     Feels much better today. Still has little edema but not having any problems with breathing. I agree that she probably will need a short course of oral Lasix at home. Can then determine whether or not it is a standing dose or when necessary dose when she sees Mr. Kathlen Mody in follow-up.  We'll continue 7 day course of Cipro.  Otherwise stable on her current regimen. She's been an MRI knee without any difficulty. She is probably safe to go home. Anticipate discharge later today.   Glenetta Hew, M.D., M.S. Interventional Cardiologist   Pager # [phone] Phone # [phone] [place], [place] [address]

## 2015-10-11 NOTE — Plan of Care (Signed)
Problem: Activity: Goal: Risk for activity intolerance will decrease Outcome: Completed/Met Date Met: 10/11/15 Pt activity intolerance has decreased   Problem: Fluid Volume: Goal: Ability to maintain a balanced intake and output will improve Outcome: Completed/Met Date Met: 10/11/15 Pt has adequate intake and output   Problem: Activity: Goal: Ability to tolerate increased activity will improve Outcome: Completed/Met Date Met: 10/11/15 Pt able to tolerate increase activity   Problem: Cardiac: Goal: Vascular access site(s) Level 0-1 will be maintained Outcome: Completed/Met Date Met: 10/11/15 Level 0

## 2015-10-11 NOTE — Discharge Summary (Addendum)
Discharge Summary    Patient ID: Tricia [name],  MRN: [id number], DOB/AGE: [birth year] 70 y.o.  Admit date: 10/01/2015 Discharge date: 10/11/2015  Primary Care Provider: Garret Reddish Primary Cardiologist: Jerilynn Mages. Burt Knack, MD   Discharge Diagnoses    Principal Problem:   ST elevation myocardial infarction (STEMI) of inferior wall, initial episode of care [facility])  **s/p PCI/DES to the RCA (culprit vessel) with staged PCI/DES to the LAD this admission.  Active Problems:   CAD S/P percutaneous coronary angioplasty   Cardiac arrest with ventricular fibrillation (HCC)   Essential hypertension   Diabetes mellitus type II, diet controlled (HCC)   Rib pain - s/p CPR   Hyperlipidemia   UTI (urinary tract infection)    Allergies Allergies  Allergen Reactions  . Sulfonamide Derivatives Diarrhea, Nausea Only and Other (See Comments)    Also, stomach cramps  . Sulfa Antibiotics Nausea And Vomiting and Other (See Comments)    Doesn't help    Diagnostic Studies/Procedures    Cardiac Catheterization and Percutaneous Coronary Intervention 8.28.2017  Coronary Findings  Dominance: Right  Left Main  Vessel is large.  Left Anterior Descending  Prox LAD lesion, 85% stenosed. The lesion is discrete and eccentric.  Prox LAD to Mid LAD lesion, 40% stenosed. The lesion is smooth. The lesion was previously treated using a bare metal stent over 2 years ago. 2002  Dist LAD lesion, 25% stenosed. The lesion is irregular. The lesion was previously treated using a bare metal stent over 2 years ago.  First Diagonal Branch  Vessel is small in size. The vessel exhibits minimal luminal irregularities.  First Septal Branch  Vessel is small in size.  Second Diagonal Branch  Vessel is moderate in size. Vessel is angiographically normal.  Second Septal Branch  Vessel is small in size.  Third Diagonal Branch  Vessel is moderate in size. Vessel is angiographically normal.  Third Septal Branch  Vessel is  small in size.  Left Circumflex  Prox Cx to Mid Cx lesion, 40% stenosed. The lesion is tubular and smooth.  First Obtuse Marginal Branch  Vessel is small in size.  Second Obtuse Marginal Branch  Vessel is angiographically normal.  Lateral Second Obtuse Marginal Branch  Vessel is small in size.  Third Obtuse Marginal Branch  Vessel is moderate in size. Vessel is angiographically normal.  Lateral Third Obtuse Marginal Branch  Vessel is small in size.  Right Coronary Artery  Vessel is large.  Ost RCA to Prox RCA lesion, 80% stenosed.  There is no residual stenosis post intervention.  Prox RCA lesion, 100% stenosed. Culprit lesion. The lesion is thrombotic.  Angioplasty: the RCA was successfully treated using a 2.75 x 32 mm Synergy DES.    There is no residual stenosis post intervention.  Mid RCA lesion, 65% stenosed. The lesion is located proximal to the major branch. Mid vessel  Acute Marginal Branch  Vessel is small in size.  Right Posterior Descending Artery  Vessel is moderate in size. Vessel is angiographically normal.  Inferior Septal  Vessel is small in size.  Right Posterior Atrioventricular Branch  Vessel is moderate in size. Vessel is angiographically normal.  First Right Posterolateral  Vessel is moderate in size. Vessel is angiographically normal.  Second Right Posterolateral  Vessel is moderate in size. Vessel is angiographically normal.   Left Ventricle The left ventricular size is normal. The left ventricular systolic function is normal. LV end diastolic pressure is normal. The left ventricular ejection fraction is  55-65% by visual estimate. No regional wall motion abnormalities. There is trivial (1+) mitral regurgitation. _____________ 2D Echocardiogram 8.29.2017  Study Conclusions   - Left ventricle: The cavity size was normal. Wall thickness was   normal. Systolic function was normal. The estimated ejection   fraction was in the range of 60% to 65%. Wall  motion was normal;   there were no regional wall motion abnormalities. - Aortic valve: There was mild regurgitation. - Right atrium: The atrium was mildly dilated. - Tricuspid valve: There was mild-moderate regurgitation. _____________   Cardiac Catheterization and Percutaneous Coronary Intervention 8.30.2017   Coronary Findings  Dominance: Right  Left Main  Vessel is large.  Left Anterior Descending  Prox LAD lesion, 85% stenosed. The lesion is discrete and eccentric.  Angioplasty: Lesion crossed with guidewire. Pre-stent angioplasty was performed using a BALLOON ANGIOSCULPT RX 2.5X15. Maximum pressure: 12 atm. A STENT SYNERGY DES 3X32 drug eluting stent was successfully placed, and overlaps previously placed stent. Stent strut is well apposed. Post-stent angioplasty was performed using a BALLOON  EMERGE MR 3.25X20. The pre-interventional distal flow is normal (TIMI 3). The post-interventional distal flow is normal (TIMI 3). The intervention was successful . No complications occurred at this lesion. Pressure wire/FFR was not performed on the lesion . IVUS was not performed on the lesion .  There is no residual stenosis post intervention.  Prox LAD to Mid LAD lesion, 85% stenosed. The lesion is smooth. The lesion was previously treated using a bare metal stent over 2 years ago. 2002  Angioplasty: The LAD was successfully stented using 3.0 x 32 mm Synergy DES.    There is no residual stenosis post intervention.  Dist LAD lesion, 25% stenosed. The lesion is irregular. The lesion was previously treated using a bare metal stent over 2 years ago.  First Diagonal Branch  Vessel is small in size. The vessel exhibits minimal luminal irregularities.  First Septal Branch  Vessel is small in size.  Second Diagonal Branch  Vessel is moderate in size. Vessel is angiographically normal.  Second Septal Branch  Vessel is small in size.  Third Diagonal Branch  Vessel is moderate in size. Vessel is  angiographically normal.  Third Septal Branch  Vessel is small in size.  Left Circumflex  Prox Cx to Mid Cx lesion, 40% stenosed. The lesion is tubular and smooth.  Dist Cx lesion, 60% stenosed.  First Obtuse Marginal Branch  Vessel is small in size.  Second Obtuse Marginal Branch  Vessel is angiographically normal.  Lateral Second Obtuse Marginal Branch  Vessel is small in size.  Third Obtuse Marginal Branch  Vessel is moderate in size. Vessel is angiographically normal.  Ost 3rd Mrg lesion, 40% stenosed.  Lateral Third Obtuse Marginal Branch  Vessel is small in size.  Right Coronary Artery  Vessel is large.  Ost RCA to Prox RCA lesion, 0% stenosed. Ost RCA to Prox RCA lesion with no stenosis was previously treated.  Prox RCA lesion, 0% stenosed. Prox RCA lesion with no stenosis was previously treated. Culprit lesion. The lesion is thrombotic.  Mid RCA-1 lesion, 65% stenosed. The lesion is located proximal to the major branch. Mid vessel  Mid RCA-2 lesion, 20% stenosed.  Acute Marginal Branch  Vessel is small in size.  Right Posterior Descending Artery  Vessel is moderate in size. Vessel is angiographically normal.  Inferior Septal  Vessel is small in size.  Right Posterior Atrioventricular Branch  Vessel is moderate in size. Vessel is angiographically normal.  First Right Posterolateral  Vessel is moderate in size. Vessel is angiographically normal.  Second Right Posterolateral  Vessel is moderate in size. Vessel is angiographically normal.  _____________   History of Present Illness     70 year old female with a prior history of coronary artery disease status post remote or metal stenting of the LAD. She also has a history of hypertension, hyperlipidemia, diet-controlled diabetes, and morbid obesity. She was in her usual state of health until August 28, when she developed substernal chest discomfort. She drove herself to the emergency department and once she was in triage,  she lost consciousness. She was taken back to a trauma bay where CPR was initiated. Initial rhythm was ventricular fibrillation. She was treated with epinephrine and subsequently defibrillated with return of spontaneous circulation. Follow-up ECG showed inferior ST segment elevation. She was taken to the catheterization laboratory for further evaluation.  Hospital Course     Consultants: None   Diagnostic catheterization was performed on an emergent basis and revealed a total occlusion of the proximal to mid right coronary artery along with proximal to mid LAD disease. The occluded right coronary artery was felt to be the culprit vessel, and this was successfully treated using a 2.75 x 32 mm Synergy drug-eluting stent. It was felt that the LAD would require a staged intervention. Patient was monitored in the coronary intensive care unit post RCA intervention and eventually peaked troponin of 37.91. 2-D echocardiogram on August 29 showed normal LV function with an EF of 60-65%. She was initially hypotensive and we were not able to add beta blocker therapy. She was instead maintained on aspirin, statin, and brilinta. Unfortunately, she developed dyspnea on brilinta, and this was subsequently changed to Plavix therapy.  She was taken back to the catheterization laboratory on August 30 and underwent successful PCI and drug-eluting stent placement to the LAD using a 3.0 x 32 mm Synergy drug-eluting stent. She tolerated the procedure well but did continue to have chest, neck, and left arm pain, all of which were worsened with position changes and deep breathing. CT of the chest, abdomen, and pelvis were performed and did show fracture involving the anterior cortex of the sternum along with rib fractures involving ribs one through 5 on the right, and 1 through 4 on the left. These fractures were felt to be secondary to CPR and she was initiated on oral analgesics.  In the setting of VF arrest and inferior STEMI  with 2 separate interventions and ongoing muscular skeletal pain related to rib fractures, she was markedly deconditioned. She is seen by physical therapy with initial recommendation for skilled nursing. Patient refused and with ongoing physical therapy, her activity tolerance did improve, though recommendation for home health physical and occupational therapy stands.   Tricia [name] was transferred out to the floor on September 5. She complained of dysuria and dark urine. Urinalysis and urine culture did confirm urinary tract infection with Escherichia coli. She was placed on Cipro therapy beginning September 6. She also noticed lower extremity swelling and her weight was up 10 pounds from admission on September 6. She was not having any dyspnea. She was treated one dose of intravenous Lasix on September 6 and subsequently placed on Lasix 20 mg daily. Patient reports prior history of dehydration and azotemia when previously on Lasix as an outpatient. We will plan to follow up a basic metabolic panel when she is seen back in clinic next week and consider discontinue of Lasix if appropriate.  Tricia [name] has had significant improvement on multiple fronts including improved conditioning, exercise tolerance, and reduced chest wall pain.  She is felt to be stable for discharge today with HHPT/OT and rolling walker.  We will provide her with a Rx for Hydrocodone-acetamninophen 5-325 mg PO q6h prn chest wall pain r/t sternal and rib fractures.  She will only receive enough tablets for five days without refills. _____________  Discharge Vitals Blood pressure 100/72, pulse (!) 58, temperature 97.6 F (36.4 C), temperature source Oral, resp. rate 16, height '5\' 2"'  (1.575 m), weight 202 lb 8 oz (91.9 kg), SpO2 95 %.  Filed Weights   10/01/15 1830 10/10/15 0614 10/11/15 0423  Weight: 195 lb 1.7 oz (88.5 kg) 206 lb 6.4 oz (93.6 kg) 202 lb 8 oz (91.9 kg)    Labs & Radiologic Studies    CBC Lab Results  Component  Value Date   WBC 9.1 10/08/2015   HGB 10.9 (L) 10/08/2015   HCT 33.9 (L) 10/08/2015   MCV 86.9 10/08/2015   PLT 202 83/15/1761    Basic Metabolic Panel Lab Results  Component Value Date   CREATININE 1.02 (H) 10/08/2015   BUN 30 (H) 10/08/2015   NA 138 10/08/2015   K 4.3 10/08/2015   CL 109 10/08/2015   CO2 22 10/08/2015    Liver Function Tests Lab Results  Component Value Date   ALT 285 (H) 10/01/2015   AST 242 (H) 10/01/2015   ALKPHOS 126 10/01/2015   BILITOT 0.7 10/01/2015    Cardiac Enzymes Lab Results  Component Value Date   TROPONINI 7.51 (HH) 10/03/2015    Hemoglobin A1C Lab Results  Component Value Date   HGBA1C 5.6 09/11/2015    Fasting Lipid Panel Lab Results  Component Value Date   CHOL 124 10/01/2015   HDL 29 (L) 10/01/2015   LDLCALC 64 10/01/2015   LDLDIRECT 86.0 09/11/2015   TRIG 157 (H) 10/01/2015   CHOLHDL 4.3 10/01/2015   _________  Ct Abdomen Pelvis Wo Contrast  Result Date: 10/05/2015 CLINICAL DATA:  Acute onset of right-sided flank pain with urination. Initial encounter. EXAM: CT ABDOMEN AND PELVIS WITHOUT CONTRAST TECHNIQUE: Multidetector CT imaging of the abdomen and pelvis was performed following the standard protocol without IV contrast. COMPARISON:  CT of the abdomen and pelvis performed 03/18/2007 FINDINGS: A trace left-sided pleural effusion is noted, with associated atelectasis. Mild coronary artery calcification is seen. Postoperative change is noted about the stomach. The liver and spleen are unremarkable in appearance. The patient is status post cholecystectomy, with clips noted at the gallbladder fossa. The pancreas and adrenal glands are unremarkable. There is marked chronic right renal atrophy. A 4 mm nonobstructing stone is noted at the upper pole of the left kidney. There is no evidence of hydronephrosis. No obstructing ureteral stones are seen. No perinephric stranding is appreciated. No free fluid is identified. The small bowel is  unremarkable in appearance. The stomach is within normal limits. No acute vascular abnormalities are seen. Mild scattered calcification is noted along the abdominal aorta and its branches. The appendix is unremarkable in appearance; there is no evidence of appendicitis. The colon is unremarkable. The bladder is mildly distended and grossly unremarkable. The patient is status post hysterectomy. No suspicious adnexal masses are seen. No inguinal lymphadenopathy is seen. Soft tissue inflammation is noted at the right inguinal region, of uncertain significance. No acute osseous abnormalities are identified. There is grade 1 anterolisthesis of L4 on L5, with underlying facet disease. IMPRESSION: 1. No  acute abnormality seen to explain the patient's symptoms. 2. Soft tissue inflammation at the right inguinal region, of uncertain significance. 3. Marked chronic right renal atrophy, significantly worsened from 2009. 4 mm nonobstructing stone at the upper pole of the left kidney. 4. Trace left-sided pleural effusion, with associated atelectasis. 5. Mild coronary artery calcifications seen. 6. Mild scattered calcification along the abdominal aorta and its branches. Electronically Signed   By: Garald Balding M.D.   On: 10/05/2015 21:42   Ct Chest Wo Contrast  Result Date: 10/04/2015 CLINICAL DATA:  Recent CPR.  Severe chest wall tenderness. EXAM: CT CHEST WITHOUT CONTRAST TECHNIQUE: Multidetector CT imaging of the chest was performed following the standard protocol without IV contrast. COMPARISON:  Chest CT from 2005. FINDINGS: Chest wall: No breast masses or chest wall hematoma. No supraclavicular or axillary mass or adenopathy. The thyroid gland appears normal. Cardiovascular: The heart is normal in size. No pericardial effusion. Moderate atherosclerotic calcifications involving the thoracic aorta. There are three-vessel coronary artery calcifications along with an LAD stent. Mediastinum/Nodes: No mediastinal or hilar  mass or adenopathy. Small scattered lymph nodes are noted. The esophagus is grossly normal. Lungs/Pleura: Biapical pleural-parenchymal scarring type changes. Patchy ground-glass opacity bilaterally but most notable in the left upper lobe. This is most likely some type of partial airspace filling process and could be edema, inflammation or early infection. Given the fact the patient had recent CPR its is probably edema. I do not see any pulmonary contusions or hematoma. No pneumothorax. Very small bilateral pleural effusions with overlying atelectasis. 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 Moderate atherosclerotic calcifications involving the abdominal aorta. The right kidney is markedly atrophic. There is some contrast in the left kidney likely from a recent contrast enhanced examination. Bilateral adrenal gland adenomas. Musculoskeletal: There is a fracture involving the anterior cortex of the the sternum. The posterior cortex is intact. No retrosternal hematoma. There are nondisplaced and minimally displaced 1 through 5 rib fractures on the right and 1 through 4 rib fractures on the left. IMPRESSION: 1. Fracture involving the anterior cortex of the sternum. The posterior cortex is intact. 2. One through 5 rib fractures on the right and 1 through 4 rib fractures on the left. 3. Patchy faint ground-glass opacity is probable resolving pulmonary edema. 4. No worrisome pulmonary lesions or pneumothorax. 5. Extensive coronary artery calcifications and aortic calcifications. 6. Very small bilateral pleural effusions and bibasilar atelectasis. 7. 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 8. Markedly atrophic right kidney. 9. Bilateral adrenal gland adenomas. Electronically Signed   By: Marijo Sanes M.D.   On: 10/04/2015 15:43   Dg Chest Port 1 View  Result Date: 10/01/2015 CLINICAL DATA:  Chest pain.  Stent placement in cath lab. EXAM: PORTABLE CHEST 1 VIEW COMPARISON:  09/10/2005 FINDINGS:  Mild cardiac enlargement. Pulmonary vascularity is normal. Mild central interstitial pattern to the lungs likely represent chronic bronchitis. No focal airspace disease or consolidation. No blunting of costophrenic angles. No pneumothorax. Calcification of the aorta. Postoperative changes in cervical spine. IMPRESSION: Chronic bronchitic changes in the lungs. Mild cardiac enlargement. No evidence of active pulmonary disease. Electronically Signed   By: Lucienne Capers M.D.   On: 10/01/2015 18:55   Disposition   Pt is being discharged home today in good condition.  Follow-up Plans & Appointments    Follow-up Information    Richardson Dopp, PA-C Follow up on 10/19/2015.   Specialties:  Cardiology, Physician Assistant Why:  10:15 AM - Dr. Antionette Char PA Contact  information: [address] [place] [address] [phone]        [place] .   Why:  Registered Nurse, Physical Therapy, Aide Contact information: [place] [phone]        Inc[facility] - Dme [facility] .   Why:  Rollator, Midwife information: [place] [phone]        [place] .   Why:  Case Manager will call the patient.  Contact information: [place] [place] [phone]         Discharge Medications   Current Discharge Medication List    START taking these medications   Details  carvedilol (COREG) 3.125 MG tablet Take 1 tablet (3.125 mg total) by mouth 2 (two) times daily with a meal. Qty: 60 tablet, Refills: 6    ciprofloxacin (CIPRO) 500 MG tablet Take 1 tablet (500 mg total) by mouth 2 (two) times daily. Qty: 11 tablet, Refills: 0    clopidogrel (PLAVIX) 75 MG tablet Take 1 tablet (75 mg total) by mouth daily. Qty: 30 tablet, Refills: 6    furosemide (LASIX) 20 MG tablet Take 1 tablet (20 mg total) by  mouth daily. Qty: 30 tablet, Refills: 1    pantoprazole (PROTONIX) 40 MG tablet Take 1 tablet (40 mg total) by mouth daily. Qty: 30 tablet, Refills: 6      CONTINUE these medications which have CHANGED   Details  atorvastatin (LIPITOR) 80 MG tablet Take 1 tablet (80 mg total) by mouth daily. Qty: 30 tablet, Refills: 6    HYDROcodone-acetaminophen (NORCO/VICODIN) 5-325 MG tablet Take 1 tablet by mouth every 6 (six) hours as needed for moderate pain (for rib/chest wall pain related to rib/sternal fractures). Qty: 20 tablet, Refills: 0    nitroGLYCERIN (NITROLINGUAL) 0.4 MG/SPRAY spray Place 1 spray under the tongue every 5 (five) minutes as needed for chest pain. Qty: 4.9 g, Refills: 3    valsartan (DIOVAN) 40 MG tablet Take 1 tablet (40 mg total) by mouth daily. Qty: 30 tablet, Refills: 6      CONTINUE these medications which have NOT CHANGED   Details  aspirin EC 81 MG tablet Take 81 mg by mouth daily.    Biotin 10000 MCG TABS Take 10,000 mcg by mouth daily. Dissolvable tablets    Cholecalciferol (VITAMIN D3) 2000 units CHEW Chew 2,000 Units by mouth daily.    Multiple Vitamin (MULTIVITAMIN WITH MINERALS) TABS tablet Take 1 tablet by mouth daily. Women's One a Day    tizanidine (ZANAFLEX) 2 MG capsule Take 1 capsule (2 mg total) by mouth 3 (three) times daily as needed for muscle spasms. Qty: 60 capsule, Refills: 2    VENTOLIN HFA 108 (90 BASE) MCG/ACT inhaler INHALE TWO PUFFS BY MOUTH EVERY SIX HOURS as needed for wheezing Qty: 18 g, Refills: 0    blood glucose meter kit and supplies KIT Test once daily for glucose control Qty: 1 each, Refills: 0    glucose blood test strip Use as instructed Qty: 100 each, Refills: 12      STOP taking these medications     isosorbide mononitrate (IMDUR) 60 MG 24 hr tablet      metoprolol (LOPRESSOR) 50 MG tablet      omeprazole (PRILOSEC) 40 MG capsule         Aspirin prescribed at discharge?  Yes High Intensity Statin  Prescribed? (Lipitor 40-44m or Crestor 20-459m: Yes Beta Blocker Prescribed? Yes For EF <40%, was ACEI/ARB Prescribed? Yes (though EF nl) ADP Receptor Inhibitor Prescribed? (i.e. Plavix etc.-Includes Medically Managed Patients): Yes For EF <40%, Aldosterone Inhibitor Prescribed? N/A Was EF assessed during THIS hospitalization? Yes Was Cardiac Rehab II ordered? (Included Medically managed Patients): Yes   Outstanding Labs/Studies   Follow-up BMET @ follow-up office visit. Follow-up lipids/lft's in 6 wks.  Duration of Discharge Encounter   Greater than 30 minutes including physician time.  Signed, ChMurray HodgkinsP 10/11/2015, 1:23 PM    ATTENDING ATTESTATION: I saw evaluated the patient today along with Mr. BeSharolyn Douglasn follow-up rounds. She is stable today. She is now stable for discharge home with some home health assessment and assistance. She will be discharged on antibiotics to complete her seven-day course for UTI as well as Lasix she will take daily until she sees a PA in follow-up.  She has follow-up arranged.   ChMurray HodgkinsM.D., M.S. Interventional Cardiologist   Pager # [phone] # [phone] [address] [id number]

## 2015-10-11 NOTE — Telephone Encounter (Signed)
Pt still currently admitted.  Will plan to call 9/8 if pt discharged.

## 2015-10-11 NOTE — Consult Note (Addendum)
   [facility] CM Inpatient Consult   10/11/2015  Tricia [name] 06/04/1945 [id number]   Received another referral for [facility] services form inpatient RNCM on yesterday.  Patient will discharge home with home health [[facility] and would like [facility] to follow for care management monitoring.  Patient has [facility] [employer] with Dr. Garret Reddish as her primary care provider. Met with the patient, she spoke at length about discharging home and planning. Patient states she wants to be able to eventually go to Cardiac Rehab.  She signed the consent form for [facility] services. Patient will receive post hospital follow up calls and be assessed for home visits.  Inpatient RNCM aware that patient will be followed.  For questions, please contact:  Natividad Brood, RN BSN [facility] Liaison  [phone] business mobile phone Toll free office [phone]    Patient had a 10 pound weight gain, she states she will be able to monitor her weights, [facility] to follow up as well.

## 2015-10-11 NOTE — Telephone Encounter (Addendum)
Pt was dc'd from the hospital today.  Pt states she has broken ribs from the cpr. Pt had a heart attack. Pt states she was only given  20  HYDROcodone-acetaminophen (NORCO/VICODIN) 5-325 MG  Tabs. Pt wants to know if Dr Yong Channel will give her 10-325 mg. (pt is taking 2 of the 5mg  at once) Pt states she is in a lot of pain. Pt would like to pick up Rx  Monday.   Pt is having severe dry mouth and wants to know if anything Dr Yong Channel can prescribe or suggest she can do.   Also, pt states she apologizes to Dr Yong Channel for not listening to him when he advised her several weeks ago she should dee a cardiologist.  Pt states she should have listened.

## 2015-10-12 ENCOUNTER — Ambulatory Visit: Payer: Self-pay | Admitting: Family Medicine

## 2015-10-12 ENCOUNTER — Other Ambulatory Visit: Payer: Self-pay | Admitting: *Deleted

## 2015-10-12 DIAGNOSIS — G4730 Sleep apnea, unspecified: Secondary | ICD-10-CM | POA: Diagnosis not present

## 2015-10-12 DIAGNOSIS — I10 Essential (primary) hypertension: Secondary | ICD-10-CM | POA: Diagnosis not present

## 2015-10-12 DIAGNOSIS — E538 Deficiency of other specified B group vitamins: Secondary | ICD-10-CM | POA: Diagnosis not present

## 2015-10-12 DIAGNOSIS — F419 Anxiety disorder, unspecified: Secondary | ICD-10-CM | POA: Diagnosis not present

## 2015-10-12 DIAGNOSIS — D649 Anemia, unspecified: Secondary | ICD-10-CM | POA: Diagnosis not present

## 2015-10-12 DIAGNOSIS — I214 Non-ST elevation (NSTEMI) myocardial infarction: Secondary | ICD-10-CM | POA: Diagnosis not present

## 2015-10-12 DIAGNOSIS — F329 Major depressive disorder, single episode, unspecified: Secondary | ICD-10-CM | POA: Diagnosis not present

## 2015-10-12 DIAGNOSIS — Z7901 Long term (current) use of anticoagulants: Secondary | ICD-10-CM | POA: Diagnosis not present

## 2015-10-12 DIAGNOSIS — Z951 Presence of aortocoronary bypass graft: Secondary | ICD-10-CM | POA: Diagnosis not present

## 2015-10-12 DIAGNOSIS — E119 Type 2 diabetes mellitus without complications: Secondary | ICD-10-CM | POA: Diagnosis not present

## 2015-10-12 DIAGNOSIS — Z87891 Personal history of nicotine dependence: Secondary | ICD-10-CM | POA: Diagnosis not present

## 2015-10-12 DIAGNOSIS — N390 Urinary tract infection, site not specified: Secondary | ICD-10-CM | POA: Diagnosis not present

## 2015-10-12 DIAGNOSIS — I2510 Atherosclerotic heart disease of native coronary artery without angina pectoris: Secondary | ICD-10-CM | POA: Diagnosis not present

## 2015-10-12 DIAGNOSIS — E785 Hyperlipidemia, unspecified: Secondary | ICD-10-CM | POA: Diagnosis not present

## 2015-10-12 DIAGNOSIS — E669 Obesity, unspecified: Secondary | ICD-10-CM | POA: Diagnosis not present

## 2015-10-12 DIAGNOSIS — Z7982 Long term (current) use of aspirin: Secondary | ICD-10-CM | POA: Diagnosis not present

## 2015-10-12 NOTE — Telephone Encounter (Signed)
Pt states she was given ALPRAZolam (XANAX) 0.25 MG tablet  In the hospital and pt states she needs a refill of this  1 every 4 hrs as needed.  Pt states her son is going out of town and only one that can pick up for her.  Pt states she cannot drive.

## 2015-10-12 NOTE — Telephone Encounter (Signed)
Patient contacted regarding discharge from [facility] on 10/11/15.  Patient understands to follow up with provider Richardson Dopp PA-C  on 915/17 at 1015 at [facility]. Location. Patient understands discharge instructions? yes Patient understands medications and regiment? yes Patient understands to bring all medications to this visit? yes   Pt did have a request for Dr Burt Knack to write for her anti-anxiety medication.  Pt states this helps with her anxiety and breathing.  Pt very anxious over the phone and very worried about what's to come, given her extensive cardiac history. Pt states she is "overwhelmed" with everything she has been dealing with, being s/p CPR, recent hospitalization for an extended period of time, and states feelings of being "a burden on my children." Pt did mention they were giving her Xanax in the hospital for this, and was discharged with none.  Pt states she contacted Dr Ansel Bong office for this, but was instructed to come in for assessment for further medicine.  Pt is unable to commute to appointments on her own, for driving restrictions given, s/p cpr with major intervention.  Pt will try to go in and see someone at [facility] clinic tomorrow, but is unsure for she will need someone to drive her there.  Informed the pt that I will most certainly route her concern and request for anti-anxiety medication to Dr Burt Knack for further review and recommendation.  Informed the pt that Dr Antionette Char RN or a triage Nurse will follow-up with her thereafter with his recommendations. Pt verbalized understanding, agrees with this plan, and gracious for all the assistance provided.

## 2015-10-12 NOTE — Telephone Encounter (Signed)
Spoke with patient who states that she does not need Hydrocodone refill as she is not really using it. She also states she has a prescription Dr. Yong Channel wrote her a couple of months ago that she is going to bring to him. She states she needs the Xanax prescription as she is using the Xanax to help with her breathing and using her incentive spirometer.  I offered her an appointment for Monday or Tuesday. She stated she can not drive herself right now and has to ride in a certain type of car or she is in excruciating pain. She does not know her grandsons work schedule as she would need one of them to drive her.

## 2015-10-12 NOTE — Telephone Encounter (Signed)
Needs appointment in regards to xanax. Thankful we did not fill hydrocodone given report of not needing it

## 2015-10-15 ENCOUNTER — Encounter: Payer: Self-pay | Admitting: *Deleted

## 2015-10-15 ENCOUNTER — Other Ambulatory Visit: Payer: Self-pay | Admitting: *Deleted

## 2015-10-15 NOTE — Patient Outreach (Signed)
Transition of care call #1 completed today. Pt has agreed to participate in weekly calls. She has her MD appt tomorrow.   Tricia [name] [facility] [phone]

## 2015-10-15 NOTE — Telephone Encounter (Signed)
Please see documentation in chart from PCP.  Pt has a scheduled appointment tomorrow to address anxiety/pain medications. I did not send in prescription for Xanax since the pt is seeing PCP tomorrow.

## 2015-10-15 NOTE — Telephone Encounter (Signed)
Needs to see PCP as it will important for her to have long-term FU for her anxiety. Would be fine to write her a short-term Rx for Xanax 0.5 mg BID as needed for anxiety, #40.

## 2015-10-16 ENCOUNTER — Encounter: Payer: Self-pay | Admitting: Family Medicine

## 2015-10-16 ENCOUNTER — Ambulatory Visit (INDEPENDENT_AMBULATORY_CARE_PROVIDER_SITE_OTHER): Payer: PPO | Admitting: Family Medicine

## 2015-10-16 VITALS — BP 130/64 | HR 77 | Temp 97.9°F | Wt 198.2 lb

## 2015-10-16 DIAGNOSIS — N390 Urinary tract infection, site not specified: Secondary | ICD-10-CM

## 2015-10-16 DIAGNOSIS — I4901 Ventricular fibrillation: Secondary | ICD-10-CM

## 2015-10-16 DIAGNOSIS — I2119 ST elevation (STEMI) myocardial infarction involving other coronary artery of inferior wall: Secondary | ICD-10-CM | POA: Diagnosis not present

## 2015-10-16 DIAGNOSIS — Z23 Encounter for immunization: Secondary | ICD-10-CM | POA: Diagnosis not present

## 2015-10-16 DIAGNOSIS — R0781 Pleurodynia: Secondary | ICD-10-CM

## 2015-10-16 DIAGNOSIS — I10 Essential (primary) hypertension: Secondary | ICD-10-CM

## 2015-10-16 DIAGNOSIS — E785 Hyperlipidemia, unspecified: Secondary | ICD-10-CM

## 2015-10-16 DIAGNOSIS — I469 Cardiac arrest, cause unspecified: Secondary | ICD-10-CM

## 2015-10-16 MED ORDER — HYDROCODONE-ACETAMINOPHEN 10-325 MG PO TABS: 1.0000 | ORAL_TABLET | Freq: Four times a day (QID) | ORAL | 0 refills | Status: DC | PRN

## 2015-10-16 MED ORDER — DIAZEPAM 2 MG PO TABS: 2.0000 mg | ORAL_TABLET | Freq: Four times a day (QID) | ORAL | 0 refills | Status: DC | PRN

## 2015-10-16 NOTE — Assessment & Plan Note (Signed)
Controlled on valsartan 40mg , coreg 3.125 mg BID (changed from metoprolol), lasix 20mg  daily. Now off hctz and imdur at present time. Plan was for bmet at cardiology visit -wants to hold off today.

## 2015-10-16 NOTE — Assessment & Plan Note (Signed)
Pravastatin---> recent change to atorv 20 with goal LDL <70 (was 87 before change- does appear to got to goal)--> after nstemi was increased to atorvastatin 80mg . Update lipids next follow up- suspect excellent control

## 2015-10-16 NOTE — Progress Notes (Signed)
Subjective:  Tricia [name] is a 70 y.o. year old very pleasant female patient who presents for/with See problem oriented charting  HPI Patient discharged 10/11/15 after over 10 day hospitalization for STEMi s/p PCI/DES to RCA with staged PCI/DES to LAD. In ER developed cardiac arrest with v fib and was resuscitated through CPR including epinephrine then proceeded to cath. She dealth with severe rib pain after compressions with sternum and 9 rib fractures.  Echo was 60% valves largely normal except mild regurg aortic valve, mild- moderate tricuspid regurgitation. She was also treated for UTI with ciprofoxacin. Weight was up 10 lbs in hospital and received lasix 38m daily  ROS- chest pain at area of rib fractures, able to take slow deep breaths, has some swelling in legs.see any ROS included in HPI as well.   Past Medical History-  Patient Active Problem List   Diagnosis Date Noted  . Rib pain - s/p CPR 10/08/2015    Priority: High  . Cardiac arrest with ventricular fibrillation ([facility] 10/01/2015    Priority: High  . ST elevation myocardial infarction (STEMI) of inferior wall, initial episode of care ([facility] 10/01/2015    Priority: High  . Second hand smoke exposure 07/28/2015    Priority: High  . History of gastric bypass 02/14/2014    Priority: High  . CAD S/P percutaneous coronary angioplasty 09/22/2006    Priority: High  . Recurrent UTI 03/14/2014    Priority: Medium  . Former smoker 02/14/2014    Priority: Medium  . Diabetes mellitus type II, controlled ([place] 02/14/2014    Priority: Medium  . Anxiety state 10/03/2009    Priority: Medium  . ANEMIA, VITAMIN B12 DEFICIENCY 04/04/2009    Priority: Medium  . Hyperlipidemia 09/22/2006    Priority: Medium  . Essential hypertension 09/22/2006    Priority: Medium  . Low back pain 09/22/2006    Priority: Medium  . Postoperative malabsorption 07/27/2015    Priority: Low  . GERD (gastroesophageal reflux disease) 02/14/2014    Priority:  Low  . Osteopenia 12/14/2006    Priority: Low  . Osteoarthritis 09/22/2006    Priority: Low    Medications- reviewed and updated Current Outpatient Prescriptions  Medication Sig Dispense Refill  . aspirin EC 81 MG tablet Take 81 mg by mouth daily.    .Marland Kitchenatorvastatin (LIPITOR) 80 MG tablet Take 1 tablet (80 mg total) by mouth daily. 30 tablet 6  . Biotin 10000 MCG TABS Take 10,000 mcg by mouth daily. Dissolvable tablets    . blood glucose meter kit and supplies KIT Test once daily for glucose control 1 each 0  . carvedilol (COREG) 3.125 MG tablet Take 1 tablet (3.125 mg total) by mouth 2 (two) times daily with a meal. 60 tablet 6  . Cholecalciferol (VITAMIN D3) 2000 units CHEW Chew 2,000 Units by mouth daily.    . ciprofloxacin (CIPRO) 500 MG tablet Take 1 tablet (500 mg total) by mouth 2 (two) times daily. 11 tablet 0  . clopidogrel (PLAVIX) 75 MG tablet Take 1 tablet (75 mg total) by mouth daily. 30 tablet 6  . furosemide (LASIX) 20 MG tablet Take 1 tablet (20 mg total) by mouth daily. 30 tablet 1  . glucose blood test strip Use as instructed 100 each 12  . Multiple Vitamin (MULTIVITAMIN WITH MINERALS) TABS tablet Take 1 tablet by mouth daily. Women's One a Day    . nitroGLYCERIN (NITROLINGUAL) 0.4 MG/SPRAY spray Place 1 spray under the tongue every 5 (five) minutes  as needed for chest pain. 4.9 g 3  . pantoprazole (PROTONIX) 40 MG tablet Take 1 tablet (40 mg total) by mouth daily. 30 tablet 6  . valsartan (DIOVAN) 40 MG tablet Take 1 tablet (40 mg total) by mouth daily. 30 tablet 6  . VENTOLIN HFA 108 (90 BASE) MCG/ACT inhaler INHALE TWO PUFFS BY MOUTH EVERY SIX HOURS as needed for wheezing 18 g 0   No current facility-administered medications for this visit.     Objective: BP 130/64 (BP Location: Left Arm, Patient Position: Sitting, Cuff Size: Large)   Pulse 77   Temp 97.9 F (36.6 C) (Oral)   Wt 198 lb 3.2 oz (89.9 kg)   SpO2 97%   BMI 36.25 kg/m  Gen: NAD, resting  comfortably CV: RRR no murmurs rubs or gallops Severely tender chest wall Lungs: CTAB no crackles, wheeze, rhonchi Abdomen: soft/nontender/nondistended/normal bowel sounds. obese Ext: 1+ edema Skin: warm, dry, no rash Neuro: grossly normal, moves all extremities  Assessment/Plan:  Recurrent UTI As noted- treated with cipro in hospital- symptoms have resolved  Rib pain - s/p CPR 9 rib fractures from CPR as well as sternum fracture. Pain controlled on q4 hour morphine but on discharge sent home on hydrocodone q6 hours and has had to take total of 67m to control pain. Given #30 of hydrocodone 142mwith discussion that if needs further needs follow up visit and would plan on slowly titrating down at this point. Also has some muscle spasms which baclofen that she borrowed help as well as some valium- opted to trial valium 61m48mnstead at this point.   ST elevation myocardial infarction (STEMI) of inferior wall, [facility] led to v. Fib which was treated with CPR. Now s/p PCI/DES to RCA (culprit lesion) and staged PCI/DES to LAD. She is taking aspirin and pradaxa. Statin bumped to max lipitor. BP now controlled- continue current therapy. We had discussed returning to cardiology about a month before visit which she declined- she is now fully committed to regular follow up fortunately. Echo did not show CHF- will continue lasix for now with hopes that can be pulled back off this in future- may have been from fluids provided during hospitalization.   Hyperlipidemia Pravastatin---> recent change to atorv 20 with goal LDL <70 (was 87 before change- does appear to got to goal)--> after nstemi was increased to atorvastatin 42m64mpdate lipids next follow up- suspect excellent control    Essential hypertension Controlled on valsartan 40mg10mreg 3.125 mg BID (changed from metoprolol), lasix 20mg 34my. Now off hctz and imdur at present time. Plan was for bmet at cardiology visit  -wants to hold off today.   Orders Placed This Encounter  Procedures  . Flu vaccine HIGH DOSE PF    Meds ordered this encounter  Medications  . HYDROcodone-acetaminophen (NORCO) 10-325 MG tablet    Sig: Take 1 tablet by mouth every 6 (six) hours as needed for severe pain.    Dispense:  30 tablet    Refill:  0  . diazepam (VALIUM) 2 MG tablet    Sig: Take 1 tablet (2 mg total) by mouth every 6 (six) hours as needed for anxiety.    Dispense:  30 tablet    Refill:  0    Return precautions advised.  StepheGarret Reddish

## 2015-10-16 NOTE — Assessment & Plan Note (Signed)
As noted- treated with cipro in hospital- symptoms have resolved

## 2015-10-16 NOTE — Assessment & Plan Note (Addendum)
STEMI led to v. Fib which was treated with CPR. Now s/p PCI/DES to RCA (culprit lesion) and staged PCI/DES to LAD. She is taking aspirin and pradaxa. Statin bumped to max lipitor. BP now controlled- continue current therapy. We had discussed returning to cardiology about a month before visit which she declined- she is now fully committed to regular follow up fortunately. Echo did not show CHF- will continue lasix for now with hopes that can be pulled back off this in future- may have been from fluids provided during hospitalization.

## 2015-10-16 NOTE — Patient Instructions (Signed)
Can use hydrocodone every 6 hours but really want you to start spacing out as able to 8 hours and eventually to come off  Trial valium for anxiety and muscle spasm  Follow up with cardiology this week  See me if you run out of either prescription  High dose flu shot today

## 2015-10-16 NOTE — Progress Notes (Signed)
Pre visit review using our clinic review tool, if applicable. No additional management support is needed unless otherwise documented below in the visit note. 

## 2015-10-16 NOTE — Assessment & Plan Note (Addendum)
9 rib fractures from CPR as well as sternum fracture. Pain controlled on q4 hour morphine but on discharge sent home on hydrocodone q6 hours and has had to take total of 10mg  to control pain. Given #30 of hydrocodone 10mg  with discussion that if needs further needs follow up visit and would plan on slowly titrating down at this point. Also has some muscle spasms which baclofen that she borrowed help as well as some valium- opted to trial valium 2mg  instead at this point.

## 2015-10-17 ENCOUNTER — Encounter: Payer: Self-pay | Admitting: Physician Assistant

## 2015-10-18 DIAGNOSIS — E669 Obesity, unspecified: Secondary | ICD-10-CM | POA: Diagnosis not present

## 2015-10-18 DIAGNOSIS — Z7982 Long term (current) use of aspirin: Secondary | ICD-10-CM | POA: Diagnosis not present

## 2015-10-18 DIAGNOSIS — F419 Anxiety disorder, unspecified: Secondary | ICD-10-CM | POA: Diagnosis not present

## 2015-10-18 DIAGNOSIS — Z7901 Long term (current) use of anticoagulants: Secondary | ICD-10-CM | POA: Diagnosis not present

## 2015-10-18 DIAGNOSIS — N390 Urinary tract infection, site not specified: Secondary | ICD-10-CM | POA: Diagnosis not present

## 2015-10-18 DIAGNOSIS — D649 Anemia, unspecified: Secondary | ICD-10-CM | POA: Diagnosis not present

## 2015-10-18 DIAGNOSIS — Z87891 Personal history of nicotine dependence: Secondary | ICD-10-CM | POA: Diagnosis not present

## 2015-10-18 DIAGNOSIS — Z951 Presence of aortocoronary bypass graft: Secondary | ICD-10-CM | POA: Diagnosis not present

## 2015-10-18 DIAGNOSIS — E119 Type 2 diabetes mellitus without complications: Secondary | ICD-10-CM | POA: Diagnosis not present

## 2015-10-18 DIAGNOSIS — G4730 Sleep apnea, unspecified: Secondary | ICD-10-CM | POA: Diagnosis not present

## 2015-10-18 DIAGNOSIS — E538 Deficiency of other specified B group vitamins: Secondary | ICD-10-CM | POA: Diagnosis not present

## 2015-10-18 DIAGNOSIS — E785 Hyperlipidemia, unspecified: Secondary | ICD-10-CM | POA: Diagnosis not present

## 2015-10-18 DIAGNOSIS — I2510 Atherosclerotic heart disease of native coronary artery without angina pectoris: Secondary | ICD-10-CM | POA: Diagnosis not present

## 2015-10-18 DIAGNOSIS — F329 Major depressive disorder, single episode, unspecified: Secondary | ICD-10-CM | POA: Diagnosis not present

## 2015-10-18 DIAGNOSIS — I214 Non-ST elevation (NSTEMI) myocardial infarction: Secondary | ICD-10-CM | POA: Diagnosis not present

## 2015-10-18 DIAGNOSIS — I10 Essential (primary) hypertension: Secondary | ICD-10-CM | POA: Diagnosis not present

## 2015-10-18 NOTE — Progress Notes (Addendum)
Cardiology Office Note:    Date:  10/19/2015   ID:  Tricia [name], DOB 04-15-1945, MRN [id number]  PCP:  Garret Reddish, MD  Cardiologist:  Dr. Sherren Mocha   Electrophysiologist:  n/a  Referring MD: Marin Olp, MD   Chief Complaint  Patient presents with  . Hospitalization Follow-up    s/p Ins STEMI >> PCI   History of Present Illness:    Tricia [name] is a 70 y.o. female with a hx of CAD s/p remote BMS to the LAD, HTN, HL, DM2, morbid obesity.  She was previously seen by Dr. Kirk Ruths but has not followed up in > 10 years.    She was admitted 8/28-9/7 with Inferior ST elevation, uncomplicated by VF arrest in the emergency room. She was treated with a combination of CPR and defibrillation with ROSC.  LHC demonstrated total occlusion of the proximal to mid RCA as well as proximal mid LAD disease. RCA was treated with DES. Echocardiogram demonstrated normal LV function. She could not tolerate Brilinta secondary to dyspnea. This was changed to Plavix. She underwent staged intervention of the LAD with a DES. She continues to have chest, neck and left arm pain worse with positional changes and deep breathing. CT of the chest did confirm sternal and rib fractures related to CPR. Patient was deconditioned and SNF placement was recommended. However, the patient declined. Hospitalization was complicated by Escherichia coli UTI. She did have some evidence of volume excess and was placed on Lasix. She has had significant anxiety as well as continued pain since discharge. She has followed up with primary care who is managing her anxiety.  Returns for follow-up.  She is here today with her daughter-in-law (whom I have seen in the past when she has come in with her own mother). Patient continues to note chest discomfort. This seems to be positional and related to her multiple rib fractures. She's trying to take the hydrocodone less and less. She still has difficulty laying flat. She denies  syncope. Her breathing is stable. She denies any bleeding issues. She has been adherent to her medications. She does note some mild pedal edema. This seems to improve after she lays flat.   Prior CV studies that were reviewed today include:    LHC 10/03/15 LAD prox and mid stent 85% ISR, dist stent ok with 25% ISR LCx prox 40%, dist 60% RCA ostial stent ok, prox stent ok, mid 65% and 20% PCI Angiosculpt scoring balloon and 3 x 32 mm Synergy DES to prox and mid LAD  Echo 10/02/15 EF 60-65%, normal wall motion, mild AI, mild RAE, mild to moderate TR  LHC 10/01/15 RCA ostial 80% and proximal 100%, mid 65% LCx prox 40% LAD prox stent 40% ISR, 85% ISR, dist stent ok with 25% ISR EF 55-65% PCI: 2.75 x 32 mm Synergy DES to ost/prox RCA   Past Medical History:  Diagnosis Date  . Adenomatous polyp of colon 02/2004  . Anemia   . Anxiety   . Arthritis   . B12 deficiency   . CAD (coronary artery disease)    a. s/p remote BMS to LAD;  b. 09/2015 Inf STEMI/VF Arrest: LM nl, LAD 85p (staged PCI 2 days later w/ 3.0x32 Synergy DES), 40p/m, 25d, LCX 52m RCA 80ost/100p (2.75x32 Synergy DES), 615mEF 55-65%.  . Depression   . DEPRESSION 09/22/2006   Qualifier: Diagnosis of  By: WiJimmye NormanLPN, BoWinfield Cunas . Dermatophytosis of groin and perianal area   .  Diet Controlled Diabetes Mellitus   . Diverticulosis   . H/O echocardiogram    a. 09/2015 Echo: EF 60-65%, no rwma, mild AI, mildly dil RA, mild to mod TR.  Marland Kitchen Hyperlipidemia   . Hypertensive heart disease   . Low back pain    l5 disc  . Morbid obesity ([place])   . Obesity   . Osteoporosis   . Sleep apnea   . Sleep apnea 10/03/2009   Resolved after gastric bypass    . TOBACCO ABUSE 10/05/2009   Qualifier: Diagnosis of  By: Stanford Breed, MD, Kandyce Rud   . Ventricular fibrillation ([place]) 10/01/2015   a. In setting of inferior STEMI.    Past Surgical History:  Procedure Laterality Date  . ABDOMINAL HYSTERECTOMY    . APPENDECTOMY    .  BARIATRIC SURGERY    . CARDIAC CATHETERIZATION N/A 10/01/2015   Procedure: Left Heart Cath and Coronary Angiography;  Surgeon: Leonie Man, MD;  Location: [place] [facility];  Service: Cardiovascular;  Laterality: N/A;  . CARDIAC CATHETERIZATION N/A 10/01/2015   Procedure: Coronary Stent Intervention;  Surgeon: Leonie Man, MD;  Location: [place] [facility];  Service: Cardiovascular;  Laterality: N/A;  . CARDIAC CATHETERIZATION N/A 10/03/2015   Procedure: Coronary Stent Intervention;  Surgeon: Troy Sine, MD;  Location: [place];  Service: Cardiovascular;  Laterality: N/A;  . CARDIAC CATHETERIZATION N/A 10/03/2015   Procedure: Coronary/Graft Angiography;  Surgeon: Troy Sine, MD;  Location: [place] [facility];  Service: Cardiovascular;  Laterality: N/A;  . CARPAL TUNNEL RELEASE     bilateral  . CERVICAL DISC SURGERY    . CHOLECYSTECTOMY    . CORONARY ANGIOPLASTY  2002   2 times  . HERNIA REPAIR    . LUMBAR LAMINECTOMY     l3-4  . NEPHRECTOMY     partial  . TONSILLECTOMY    . TOTAL KNEE ARTHROPLASTY     L  . TUBAL LIGATION    . ulnar nerve release     and thumb surgery    Current Medications: Outpatient Medications Prior to Visit  Medication Sig Dispense Refill  . aspirin EC 81 MG tablet Take 81 mg by mouth daily.    Marland Kitchen atorvastatin (LIPITOR) 80 MG tablet Take 1 tablet (80 mg total) by mouth daily. 30 tablet 6  . Biotin 10000 MCG TABS Take 10,000 mcg by mouth daily. Dissolvable tablets    . carvedilol (COREG) 3.125 MG tablet Take 1 tablet (3.125 mg total) by mouth 2 (two) times daily with a meal. 60 tablet 6  . Cholecalciferol (VITAMIN D3) 2000 units CHEW Chew 2,000 Units by mouth daily.    . ciprofloxacin (CIPRO) 500 MG tablet Take 1 tablet (500 mg total) by mouth 2 (two) times daily. 11 tablet 0  . clopidogrel (PLAVIX) 75 MG tablet Take 1 tablet (75 mg total) by mouth daily. 30 tablet 6  . diazepam (VALIUM) 2 MG tablet Take 1 tablet (2 mg total) by mouth  every 6 (six) hours as needed for anxiety. 30 tablet 0  . furosemide (LASIX) 20 MG tablet Take 1 tablet (20 mg total) by mouth daily. 30 tablet 1  . glucose blood test strip Use as instructed 100 each 12  . HYDROcodone-acetaminophen (NORCO) 10-325 MG tablet Take 1 tablet by mouth every 6 (six) hours as needed for severe pain. 30 tablet 0  . Multiple Vitamin (MULTIVITAMIN WITH MINERALS) TABS tablet Take 1 tablet by mouth daily. Women's One  a Day    . nitroGLYCERIN (NITROLINGUAL) 0.4 MG/SPRAY spray Place 1 spray under the tongue every 5 (five) minutes as needed for chest pain. 4.9 g 3  . pantoprazole (PROTONIX) 40 MG tablet Take 1 tablet (40 mg total) by mouth daily. 30 tablet 6  . valsartan (DIOVAN) 40 MG tablet Take 1 tablet (40 mg total) by mouth daily. 30 tablet 6  . VENTOLIN HFA 108 (90 BASE) MCG/ACT inhaler INHALE TWO PUFFS BY MOUTH EVERY SIX HOURS as needed for wheezing 18 g 0  . blood glucose meter kit and supplies KIT Test once daily for glucose control 1 each 0   No facility-administered medications prior to visit.       Allergies:   Sulfonamide derivatives and Sulfa antibiotics   Social History   Social History  . Marital status: Married    Spouse name: N/A  . Number of children: N/A  . Years of education: N/A   Occupational History  . retired Unemployed   Social History Main Topics  . Smoking status: Former Smoker    Packs/day: 1.50    Years: 35.00    Types: Cigarettes    Quit date: 03/06/2008  . Smokeless tobacco: Never Used  . Alcohol use No  . Drug use: No  . Sexual activity: Yes   Other Topics Concern  . None   Social History Narrative   Widowed in 04/2012. 2 children. 3 grandkids.    Lives alone. Completely indendent.       Disabled/retired. Disabled-lifted computer paper      Hobbies: spend money-gamble     Family History:  The patient's family history includes Colitis in her mother; Heart disease in her father; Stomach cancer in her maternal uncle.    ROS:   Please see the history of present illness.    Review of Systems  Constitution: Positive for decreased appetite.  Cardiovascular: Positive for chest pain and leg swelling.  Hematologic/Lymphatic: Bruises/bleeds easily.  Skin: Positive for rash.  Musculoskeletal: Positive for back pain, joint pain and myalgias.  Gastrointestinal: Positive for nausea.   All other systems reviewed and are negative.   EKGs/Labs/Other Test Reviewed:    EKG:  EKG is  ordered today.  The ekg ordered today demonstrates Sinus bradycardia, HR 59, leftward axis, nonspecific ST-T wave changes, QTc 429 ms  Recent Labs: 07/27/2015: TSH 3.32 10/01/2015: ALT 285; Magnesium 1.9 10/08/2015: BUN 30; Creatinine, Ser 1.02; Hemoglobin 10.9; Platelets 202; Potassium 4.3; Sodium 138   Recent Lipid Panel    Component Value Date/Time   CHOL 124 10/01/2015 1530   TRIG 157 (H) 10/01/2015 1530   TRIG 122 12/11/2005 1028   HDL 29 (L) 10/01/2015 1530   CHOLHDL 4.3 10/01/2015 1530   VLDL 31 10/01/2015 1530   LDLCALC 64 10/01/2015 1530   LDLDIRECT 86.0 09/11/2015 1104     Physical Exam:    VS:  BP 110/62   Pulse (!) 59   Ht '5\' 2"'  (1.575 m)   Wt 196 lb 1.9 oz (89 kg)   BMI 35.87 kg/m     Wt Readings from Last 3 Encounters:  10/19/15 196 lb 1.9 oz (89 kg)  10/16/15 198 lb 3.2 oz (89.9 kg)  10/11/15 202 lb 8 oz (91.9 kg)     Physical Exam  Constitutional: She is oriented to person, place, and time. She appears well-developed and well-nourished. No distress.  HENT:  Head: Normocephalic and atraumatic.  Eyes: No scleral icterus.  Neck: No JVD present.  Cardiovascular:  Normal rate, regular rhythm and normal heart sounds.  Exam reveals no friction rub.   No murmur heard. Pulmonary/Chest: She has decreased breath sounds. She has no wheezes. She has no rhonchi.  Abdominal: Soft. She exhibits no distension.  Musculoskeletal: Normal range of motion. She exhibits edema.  Trace bilateral ankle edema; R wrist  without hematoma; R groin without hematoma or bruit  Neurological: She is alert and oriented to person, place, and time.  Skin: Skin is warm and dry.  Psychiatric: She has a normal mood and affect.    ASSESSMENT:    1. ST elevation myocardial infarction (STEMI) of inferior wall, initial episode of care ([place])   2. CAD S/P percutaneous coronary angioplasty   3. Essential hypertension   4. Hyperlipidemia    PLAN:    In order of problems listed above:  1. S/p Inferior STEMI - C/b VF arrest.  She underwent PCI to the RCA and staged PCI to the LAD.  She was intol of Brilinta due to dyspnea.  She is now on Plavix, ASA.  We discussed the importance of dual antiplatelet therapy.  She suffered multiple rib fractures 2/2 CPR.  She still has a lot of pain and is trying to limit the narcotic pain medications.  She is no longer seeing PT at home.  -  Refer to Cardiac Rehab  -  Continue ASA, Plavix, beta-blocker, statin  2. CAD - As noted, she is s/p Inf STEMI with PCI of RCA and LAD.  She is slowly recovering.  This is limited by rib pain from fractures related to CPR.  She will continue ASA, Plavix, statin, beta-blocker.  Refer to [facility].  3. HTN - BP controlled.  She was put on Lasix in the hospital with some relief in dyspnea and edema.  Will get FU BMET today.    4. HL - Continue statin. LFTs were very high in the hospital.  Will get repeat LFTs today.    Medication Adjustments/Labs and Tests Ordered: Current medicines are reviewed at length with the patient today.  Concerns regarding medicines are outlined above.  Medication changes, Labs and Tests ordered today are outlined in the Patient Instructions noted below. Patient Instructions  Medication Instructions:  1. Your physician recommends that you continue on your current medications as directed. Please refer to the Current Medication list given to you today.  Labwork: TODAY BMET. LFT  Testing/Procedures: NONE  Follow-Up: DR. Burt Knack IN  6 WEEKS  Any Other Special Instructions Will Be Listed Below (If Applicable). A REFERRAL TO CARDIAC REHAB AT [facility] HAS BEEN PLACED IN EPIC TODAY; CARDIAC REHAB WILL CALL YOU WITH AN APPT.   If you need a refill on your cardiac medications before your next appointment, please call your pharmacy.  Signed, Richardson Dopp, PA-C  10/19/2015 2:31 PM    [facility] [address], [place], [place]  [address] Phone: [phone]; Fax: [phone]

## 2015-10-19 ENCOUNTER — Encounter: Payer: Self-pay | Admitting: Physician Assistant

## 2015-10-19 ENCOUNTER — Ambulatory Visit (INDEPENDENT_AMBULATORY_CARE_PROVIDER_SITE_OTHER): Payer: PPO | Admitting: Physician Assistant

## 2015-10-19 ENCOUNTER — Telehealth: Payer: Self-pay | Admitting: *Deleted

## 2015-10-19 VITALS — BP 110/62 | HR 59 | Ht 62.0 in | Wt 196.1 lb

## 2015-10-19 DIAGNOSIS — I2510 Atherosclerotic heart disease of native coronary artery without angina pectoris: Secondary | ICD-10-CM

## 2015-10-19 DIAGNOSIS — I10 Essential (primary) hypertension: Secondary | ICD-10-CM | POA: Diagnosis not present

## 2015-10-19 DIAGNOSIS — E785 Hyperlipidemia, unspecified: Secondary | ICD-10-CM

## 2015-10-19 DIAGNOSIS — I2119 ST elevation (STEMI) myocardial infarction involving other coronary artery of inferior wall: Secondary | ICD-10-CM | POA: Diagnosis not present

## 2015-10-19 DIAGNOSIS — Z9861 Coronary angioplasty status: Secondary | ICD-10-CM

## 2015-10-19 LAB — BASIC METABOLIC PANEL
BUN: 42 mg/dL — ABNORMAL HIGH (ref 7–25)
CO2: 25 mmol/L (ref 20–31)
Calcium: 9.4 mg/dL (ref 8.6–10.4)
Chloride: 103 mmol/L (ref 98–110)
Creat: 1.33 mg/dL — ABNORMAL HIGH (ref 0.50–0.99)
Glucose, Bld: 112 mg/dL — ABNORMAL HIGH (ref 65–99)
Potassium: 4 mmol/L (ref 3.5–5.3)
Sodium: 140 mmol/L (ref 135–146)

## 2015-10-19 LAB — HEPATIC FUNCTION PANEL
ALT: 17 U/L (ref 6–29)
AST: 23 U/L (ref 10–35)
Albumin: 3.8 g/dL (ref 3.6–5.1)
Alkaline Phosphatase: 144 U/L — ABNORMAL HIGH (ref 33–130)
Bilirubin, Direct: 0.1 mg/dL (ref <=0.2)
Indirect Bilirubin: 0.5 mg/dL (ref 0.2–1.2)
Total Bilirubin: 0.6 mg/dL (ref 0.2–1.2)
Total Protein: 6.3 g/dL (ref 6.1–8.1)

## 2015-10-19 MED ORDER — FUROSEMIDE 20 MG PO TABS
20.0000 mg | ORAL_TABLET | Freq: Every day | ORAL | Status: DC | PRN
Start: 2015-10-19 — End: 2015-11-14

## 2015-10-19 NOTE — Patient Instructions (Addendum)
Medication Instructions:  1. Your physician recommends that you continue on your current medications as directed. Please refer to the Current Medication list given to you today.  Labwork: TODAY BMET. LFT  Testing/Procedures: NONE  Follow-Up: DR. Burt Knack IN 6 WEEKS  Any Other Special Instructions Will Be Listed Below (If Applicable). A REFERRAL TO CARDIAC REHAB AT [facility] HAS BEEN PLACED IN EPIC TODAY; CARDIAC REHAB WILL CALL YOU WITH AN APPT.   If you need a refill on your cardiac medications before your next appointment, please call your pharmacy.

## 2015-10-19 NOTE — Telephone Encounter (Signed)
Pt notified of lab results and findings by phone with verbal understanding. Pt agreeable to repeat bmet to be done 9/22. Pt aware to stop lasix 20 mg daily and only take daily as needed for weight gain of 3 lb's or more x 1 day or increased swelling or sob.

## 2015-10-22 ENCOUNTER — Other Ambulatory Visit: Payer: Self-pay | Admitting: *Deleted

## 2015-10-22 DIAGNOSIS — Z87891 Personal history of nicotine dependence: Secondary | ICD-10-CM | POA: Diagnosis not present

## 2015-10-22 DIAGNOSIS — E669 Obesity, unspecified: Secondary | ICD-10-CM | POA: Diagnosis not present

## 2015-10-22 DIAGNOSIS — N390 Urinary tract infection, site not specified: Secondary | ICD-10-CM | POA: Diagnosis not present

## 2015-10-22 DIAGNOSIS — D649 Anemia, unspecified: Secondary | ICD-10-CM | POA: Diagnosis not present

## 2015-10-22 DIAGNOSIS — Z7982 Long term (current) use of aspirin: Secondary | ICD-10-CM | POA: Diagnosis not present

## 2015-10-22 DIAGNOSIS — I10 Essential (primary) hypertension: Secondary | ICD-10-CM | POA: Diagnosis not present

## 2015-10-22 DIAGNOSIS — G4730 Sleep apnea, unspecified: Secondary | ICD-10-CM | POA: Diagnosis not present

## 2015-10-22 DIAGNOSIS — I214 Non-ST elevation (NSTEMI) myocardial infarction: Secondary | ICD-10-CM | POA: Diagnosis not present

## 2015-10-22 DIAGNOSIS — F329 Major depressive disorder, single episode, unspecified: Secondary | ICD-10-CM | POA: Diagnosis not present

## 2015-10-22 DIAGNOSIS — F419 Anxiety disorder, unspecified: Secondary | ICD-10-CM | POA: Diagnosis not present

## 2015-10-22 DIAGNOSIS — I2510 Atherosclerotic heart disease of native coronary artery without angina pectoris: Secondary | ICD-10-CM | POA: Diagnosis not present

## 2015-10-22 DIAGNOSIS — Z951 Presence of aortocoronary bypass graft: Secondary | ICD-10-CM | POA: Diagnosis not present

## 2015-10-22 DIAGNOSIS — E538 Deficiency of other specified B group vitamins: Secondary | ICD-10-CM | POA: Diagnosis not present

## 2015-10-22 DIAGNOSIS — E119 Type 2 diabetes mellitus without complications: Secondary | ICD-10-CM | POA: Diagnosis not present

## 2015-10-22 DIAGNOSIS — Z7901 Long term (current) use of anticoagulants: Secondary | ICD-10-CM | POA: Diagnosis not present

## 2015-10-22 DIAGNOSIS — E785 Hyperlipidemia, unspecified: Secondary | ICD-10-CM | POA: Diagnosis not present

## 2015-10-22 NOTE — Patient Outreach (Signed)
Transition of care call #2 - Mrs. Morosky says she is doing much better this week. She is trying to wean herself down off the hydrocodone/apap 10/325 mg. She is trying to go from tid to bid but she says about 2 hours before her evening dose she is still in a lot of pain which is in her back below her scapulae (had CPR, 9 rib fxs and sternal fx). She is also concerned that her Alkaline Phosphatase level is elevated (144). We discussed some possible alternatives for helping her with this problem. She was interested in perhaps using Voltaren Gel but after reviewing use of this, it is NOT RECOMMENDED for post MI (cardiovascular risks). I have suggested to her that she get some plain Tylenol 325 mg and take one inbetween the bid hydrocodone/apap doses. The would not give her any more APAP than what she has been taking and it would help her to wean off the hydrocodone. She says she is also not moving her bowels very frequently, about every 4 days. She is only taking Miralax once a day. I have suggested she may take this bid.   Pt denies chest pain, SOB.  I have given her some information for "back reaching devices" she may look up online so that she can order something to apply lotion to her back.  I will call her again in one week. I have reminded her she can call me if she has questions or problems.  Deloria Lair [facility] [place] [phone]

## 2015-10-23 DIAGNOSIS — I214 Non-ST elevation (NSTEMI) myocardial infarction: Secondary | ICD-10-CM | POA: Diagnosis not present

## 2015-10-23 DIAGNOSIS — I10 Essential (primary) hypertension: Secondary | ICD-10-CM | POA: Diagnosis not present

## 2015-10-23 DIAGNOSIS — E119 Type 2 diabetes mellitus without complications: Secondary | ICD-10-CM | POA: Diagnosis not present

## 2015-10-23 DIAGNOSIS — E538 Deficiency of other specified B group vitamins: Secondary | ICD-10-CM | POA: Diagnosis not present

## 2015-10-23 DIAGNOSIS — F419 Anxiety disorder, unspecified: Secondary | ICD-10-CM | POA: Diagnosis not present

## 2015-10-23 DIAGNOSIS — I2510 Atherosclerotic heart disease of native coronary artery without angina pectoris: Secondary | ICD-10-CM | POA: Diagnosis not present

## 2015-10-23 DIAGNOSIS — Z951 Presence of aortocoronary bypass graft: Secondary | ICD-10-CM | POA: Diagnosis not present

## 2015-10-23 DIAGNOSIS — E785 Hyperlipidemia, unspecified: Secondary | ICD-10-CM | POA: Diagnosis not present

## 2015-10-23 DIAGNOSIS — Z7982 Long term (current) use of aspirin: Secondary | ICD-10-CM | POA: Diagnosis not present

## 2015-10-23 DIAGNOSIS — G4730 Sleep apnea, unspecified: Secondary | ICD-10-CM | POA: Diagnosis not present

## 2015-10-23 DIAGNOSIS — E669 Obesity, unspecified: Secondary | ICD-10-CM | POA: Diagnosis not present

## 2015-10-23 DIAGNOSIS — F329 Major depressive disorder, single episode, unspecified: Secondary | ICD-10-CM | POA: Diagnosis not present

## 2015-10-23 DIAGNOSIS — Z7901 Long term (current) use of anticoagulants: Secondary | ICD-10-CM | POA: Diagnosis not present

## 2015-10-23 DIAGNOSIS — D649 Anemia, unspecified: Secondary | ICD-10-CM | POA: Diagnosis not present

## 2015-10-23 DIAGNOSIS — Z87891 Personal history of nicotine dependence: Secondary | ICD-10-CM | POA: Diagnosis not present

## 2015-10-23 DIAGNOSIS — N390 Urinary tract infection, site not specified: Secondary | ICD-10-CM | POA: Diagnosis not present

## 2015-10-25 DIAGNOSIS — G4730 Sleep apnea, unspecified: Secondary | ICD-10-CM | POA: Diagnosis not present

## 2015-10-25 DIAGNOSIS — Z87891 Personal history of nicotine dependence: Secondary | ICD-10-CM | POA: Diagnosis not present

## 2015-10-25 DIAGNOSIS — I214 Non-ST elevation (NSTEMI) myocardial infarction: Secondary | ICD-10-CM | POA: Diagnosis not present

## 2015-10-25 DIAGNOSIS — Z7901 Long term (current) use of anticoagulants: Secondary | ICD-10-CM | POA: Diagnosis not present

## 2015-10-25 DIAGNOSIS — E538 Deficiency of other specified B group vitamins: Secondary | ICD-10-CM | POA: Diagnosis not present

## 2015-10-25 DIAGNOSIS — E785 Hyperlipidemia, unspecified: Secondary | ICD-10-CM | POA: Diagnosis not present

## 2015-10-25 DIAGNOSIS — F329 Major depressive disorder, single episode, unspecified: Secondary | ICD-10-CM | POA: Diagnosis not present

## 2015-10-25 DIAGNOSIS — E119 Type 2 diabetes mellitus without complications: Secondary | ICD-10-CM | POA: Diagnosis not present

## 2015-10-25 DIAGNOSIS — F419 Anxiety disorder, unspecified: Secondary | ICD-10-CM | POA: Diagnosis not present

## 2015-10-25 DIAGNOSIS — D649 Anemia, unspecified: Secondary | ICD-10-CM | POA: Diagnosis not present

## 2015-10-25 DIAGNOSIS — N390 Urinary tract infection, site not specified: Secondary | ICD-10-CM | POA: Diagnosis not present

## 2015-10-25 DIAGNOSIS — I2510 Atherosclerotic heart disease of native coronary artery without angina pectoris: Secondary | ICD-10-CM | POA: Diagnosis not present

## 2015-10-25 DIAGNOSIS — Z7982 Long term (current) use of aspirin: Secondary | ICD-10-CM | POA: Diagnosis not present

## 2015-10-25 DIAGNOSIS — Z951 Presence of aortocoronary bypass graft: Secondary | ICD-10-CM | POA: Diagnosis not present

## 2015-10-25 DIAGNOSIS — I10 Essential (primary) hypertension: Secondary | ICD-10-CM | POA: Diagnosis not present

## 2015-10-25 DIAGNOSIS — E669 Obesity, unspecified: Secondary | ICD-10-CM | POA: Diagnosis not present

## 2015-10-26 ENCOUNTER — Telehealth: Payer: Self-pay | Admitting: Physician Assistant

## 2015-10-26 ENCOUNTER — Other Ambulatory Visit: Payer: PPO

## 2015-10-26 NOTE — Telephone Encounter (Signed)
Pt has been notified we no longer do lab work at [facility] . Pt rsc her lab work for Monday here in our office.

## 2015-10-26 NOTE — Telephone Encounter (Signed)
New message     Pt called and was not feeling well and cancelled her lab today and wants to know if she can get her lab drawn at [facility]. Please call.

## 2015-10-28 ENCOUNTER — Other Ambulatory Visit: Payer: Self-pay | Admitting: Family Medicine

## 2015-10-29 ENCOUNTER — Other Ambulatory Visit: Payer: PPO

## 2015-10-29 ENCOUNTER — Other Ambulatory Visit: Payer: Self-pay | Admitting: *Deleted

## 2015-10-29 DIAGNOSIS — D649 Anemia, unspecified: Secondary | ICD-10-CM | POA: Diagnosis not present

## 2015-10-29 DIAGNOSIS — Z7901 Long term (current) use of anticoagulants: Secondary | ICD-10-CM | POA: Diagnosis not present

## 2015-10-29 DIAGNOSIS — F419 Anxiety disorder, unspecified: Secondary | ICD-10-CM | POA: Diagnosis not present

## 2015-10-29 DIAGNOSIS — E785 Hyperlipidemia, unspecified: Secondary | ICD-10-CM | POA: Diagnosis not present

## 2015-10-29 DIAGNOSIS — E669 Obesity, unspecified: Secondary | ICD-10-CM | POA: Diagnosis not present

## 2015-10-29 DIAGNOSIS — E119 Type 2 diabetes mellitus without complications: Secondary | ICD-10-CM | POA: Diagnosis not present

## 2015-10-29 DIAGNOSIS — Z951 Presence of aortocoronary bypass graft: Secondary | ICD-10-CM | POA: Diagnosis not present

## 2015-10-29 DIAGNOSIS — F329 Major depressive disorder, single episode, unspecified: Secondary | ICD-10-CM | POA: Diagnosis not present

## 2015-10-29 DIAGNOSIS — E538 Deficiency of other specified B group vitamins: Secondary | ICD-10-CM | POA: Diagnosis not present

## 2015-10-29 DIAGNOSIS — G4730 Sleep apnea, unspecified: Secondary | ICD-10-CM | POA: Diagnosis not present

## 2015-10-29 DIAGNOSIS — I2510 Atherosclerotic heart disease of native coronary artery without angina pectoris: Secondary | ICD-10-CM | POA: Diagnosis not present

## 2015-10-29 DIAGNOSIS — Z7982 Long term (current) use of aspirin: Secondary | ICD-10-CM | POA: Diagnosis not present

## 2015-10-29 DIAGNOSIS — N390 Urinary tract infection, site not specified: Secondary | ICD-10-CM | POA: Diagnosis not present

## 2015-10-29 DIAGNOSIS — Z87891 Personal history of nicotine dependence: Secondary | ICD-10-CM | POA: Diagnosis not present

## 2015-10-29 DIAGNOSIS — I214 Non-ST elevation (NSTEMI) myocardial infarction: Secondary | ICD-10-CM | POA: Diagnosis not present

## 2015-10-29 DIAGNOSIS — I10 Essential (primary) hypertension: Secondary | ICD-10-CM | POA: Diagnosis not present

## 2015-10-30 ENCOUNTER — Other Ambulatory Visit: Payer: PPO | Admitting: *Deleted

## 2015-10-30 DIAGNOSIS — I10 Essential (primary) hypertension: Secondary | ICD-10-CM | POA: Diagnosis not present

## 2015-10-30 LAB — BASIC METABOLIC PANEL
BUN: 27 mg/dL — ABNORMAL HIGH (ref 7–25)
CO2: 26 mmol/L (ref 20–31)
Calcium: 9.3 mg/dL (ref 8.6–10.4)
Chloride: 107 mmol/L (ref 98–110)
Creat: 1.07 mg/dL — ABNORMAL HIGH (ref 0.50–0.99)
Glucose, Bld: 90 mg/dL (ref 65–99)
Potassium: 4 mmol/L (ref 3.5–5.3)
Sodium: 142 mmol/L (ref 135–146)

## 2015-10-30 NOTE — Patient Outreach (Signed)
Transition of care call. Tricia [name] is doing remarkable! She has weaned herself down to one pain pill a day. She is moving about much better and she is driving short distances now. She requests suggestions on where to get support stockings and I recommended [facility] in [place]. Tricia [name] is still receiving home health nursing services. I will call her again next week and a final time the second week of October. I have reminded her if she has any problems or questions she can call me.  Deloria Lair [facility] [phone]

## 2015-10-31 ENCOUNTER — Telehealth: Payer: Self-pay | Admitting: *Deleted

## 2015-10-31 ENCOUNTER — Telehealth: Payer: Self-pay | Admitting: Cardiology

## 2015-10-31 DIAGNOSIS — I214 Non-ST elevation (NSTEMI) myocardial infarction: Secondary | ICD-10-CM | POA: Diagnosis not present

## 2015-10-31 DIAGNOSIS — N390 Urinary tract infection, site not specified: Secondary | ICD-10-CM | POA: Diagnosis not present

## 2015-10-31 DIAGNOSIS — Z7982 Long term (current) use of aspirin: Secondary | ICD-10-CM | POA: Diagnosis not present

## 2015-10-31 DIAGNOSIS — D649 Anemia, unspecified: Secondary | ICD-10-CM | POA: Diagnosis not present

## 2015-10-31 DIAGNOSIS — Z87891 Personal history of nicotine dependence: Secondary | ICD-10-CM | POA: Diagnosis not present

## 2015-10-31 DIAGNOSIS — F329 Major depressive disorder, single episode, unspecified: Secondary | ICD-10-CM | POA: Diagnosis not present

## 2015-10-31 DIAGNOSIS — Z951 Presence of aortocoronary bypass graft: Secondary | ICD-10-CM | POA: Diagnosis not present

## 2015-10-31 DIAGNOSIS — E785 Hyperlipidemia, unspecified: Secondary | ICD-10-CM | POA: Diagnosis not present

## 2015-10-31 DIAGNOSIS — E119 Type 2 diabetes mellitus without complications: Secondary | ICD-10-CM | POA: Diagnosis not present

## 2015-10-31 DIAGNOSIS — F419 Anxiety disorder, unspecified: Secondary | ICD-10-CM | POA: Diagnosis not present

## 2015-10-31 DIAGNOSIS — E669 Obesity, unspecified: Secondary | ICD-10-CM | POA: Diagnosis not present

## 2015-10-31 DIAGNOSIS — Z7901 Long term (current) use of anticoagulants: Secondary | ICD-10-CM | POA: Diagnosis not present

## 2015-10-31 DIAGNOSIS — I2510 Atherosclerotic heart disease of native coronary artery without angina pectoris: Secondary | ICD-10-CM | POA: Diagnosis not present

## 2015-10-31 DIAGNOSIS — G4730 Sleep apnea, unspecified: Secondary | ICD-10-CM | POA: Diagnosis not present

## 2015-10-31 DIAGNOSIS — I10 Essential (primary) hypertension: Secondary | ICD-10-CM | POA: Diagnosis not present

## 2015-10-31 DIAGNOSIS — E538 Deficiency of other specified B group vitamins: Secondary | ICD-10-CM | POA: Diagnosis not present

## 2015-10-31 MED ORDER — VALSARTAN 80 MG PO TABS: 80.0000 mg | ORAL_TABLET | Freq: Every day | ORAL | 3 refills | Status: DC

## 2015-10-31 NOTE — Telephone Encounter (Signed)
New Message  Pt c/o BP issue: STAT if pt c/o blurred vision, one-sided weakness or slurred speech  1. What are your last 5 BP readings? 150/80-Monday, today 140/84 right arm 160/100 left arm  2. Are you having any other symptoms (ex. Dizziness, headache, blurred vision, passed out)? Blurry vision yesterday  3. What is your BP issue? Pt is on valsartan and Patti voiced pts bp is a little high after dosage decrease.  Please f/u with Precious Bard

## 2015-10-31 NOTE — Telephone Encounter (Signed)
Pt notified of lab results by phone with verbal understanding. Pt states HHRN said her BP was high and that [facility] said she will call Richardson Dopp, PA today. I advised I have not heard anything yet from [facility]. I asked pt what was the BP readings, but pt could not remember what the numbers were exactly.

## 2015-10-31 NOTE — Telephone Encounter (Signed)
Discussed with Richardson Dopp PA-C and he would like the pt to increase Diovan to 80mg  daily and have a BMP rechecked in 1 week. I spoke with Precious Bard and made her aware of instructions. [facility] will draw lab work next week and fax to our office. I also contacted the pt and made her aware. Rx sent to the pharmacy.

## 2015-11-01 ENCOUNTER — Telehealth: Payer: Self-pay | Admitting: Cardiovascular Disease

## 2015-11-01 NOTE — Telephone Encounter (Signed)
Tammy w/ [facility] reports pts BP this afternoon: Left arm 180/96, Right arm 200/100  Yesterday Valsartan was increased from 40mg  to 80mg .  (patient was on 320mg  prior to recent adx).  Recent OV w/ [place], on 9/15, pressure was 110/62.  Reviewed with Dr. Johnsie Cancel, [place], -- order to increase to 160 mg daily.   Advised to continue to monitor and call office if BPs remain elevated. Tammy w/ [facility] will call and advise patient.  She will recheck patient tomorrow.

## 2015-11-01 NOTE — Telephone Encounter (Signed)
New Message:    Please call,wants to give you an update on pt's condition.

## 2015-11-02 ENCOUNTER — Telehealth: Payer: Self-pay | Admitting: Cardiovascular Disease

## 2015-11-02 NOTE — Telephone Encounter (Signed)
Called Tammy (pt's home health nurse) to review symptoms. Pt is not having SOB, no swelling, no weight gain, no other symtoms. Nurse stated pt has been feeling good all day. Pt took 160 mg of Valsartan at 8:30 AM this morning.   Last BP taken at 3:00 PM - left - 196/116, right - 164/102.  HR: 68.   Will forward to DOD, Dr. Harrington Challenger to advise.

## 2015-11-02 NOTE — Telephone Encounter (Signed)
New Message  Tricia [name] voiced recheck bp and wanted to provide readings  Pt c/o BP issue: STAT if pt c/o blurred vision, one-sided weakness or slurred speech  1. What are your last 5 BP readings?  Left-196/116 Right-164/102  2. Are you having any other symptoms (ex. Dizziness, headache, blurred vision, passed out)? No  3. What is your BP issue? Took valsartan this morning

## 2015-11-02 NOTE — Telephone Encounter (Signed)
Called Tammy (home health nurse). Informed Dr. Harrington Challenger gave verbal order for BMET today or Monday. Nurse said she will draw it on Monday. Asked to call back with questions or concerns r/t pt symptoms. Nurse verbalized understanding of information.

## 2015-11-05 LAB — BASIC METABOLIC PANEL
BUN: 26 mg/dL — AB (ref 4–21)
Creatinine: 1.1 mg/dL (ref 0.5–1.1)
Glucose: 106 mg/dL
Potassium: 3.8 mmol/L (ref 3.4–5.3)
Sodium: 143 mmol/L (ref 137–147)

## 2015-11-06 ENCOUNTER — Encounter: Payer: Self-pay | Admitting: Family Medicine

## 2015-11-06 ENCOUNTER — Other Ambulatory Visit: Payer: Self-pay | Admitting: *Deleted

## 2015-11-06 NOTE — Patient Outreach (Signed)
Transition of care call. Tricia [name] is doing very well. She has received her support stockings and is wearing them. She has minimal pain, sometimes has some anginal discomfort. She is looking forward to starting cardiac rehabilitation as soon as possible. She is following all her MD recommendations and medication regimen.  I will call her again next week for her final transition of care call.  Deloria Lair [facility] [phone]

## 2015-11-07 ENCOUNTER — Encounter: Payer: Self-pay | Admitting: Family Medicine

## 2015-11-08 ENCOUNTER — Telehealth: Payer: Self-pay | Admitting: Cardiovascular Disease

## 2015-11-08 ENCOUNTER — Telehealth: Payer: Self-pay | Admitting: Physician Assistant

## 2015-11-08 MED ORDER — VALSARTAN 160 MG PO TABS: 160.0000 mg | ORAL_TABLET | Freq: Every day | ORAL | 6 refills | Status: DC

## 2015-11-08 NOTE — Telephone Encounter (Signed)
I spoke with patient, she reports taking Valsartan 160 for the past 2 weeks and needs a refill.  I advised her a refill was sent today to pharmacy on file.  She states her BP is still running high and I need to contact Tammy at [facility] or Abigail Butts at [facility] because they are still trying to draw blood.  I spoke to [place] at [facility]. She states they have an order from our office to draw BMP this week. I advised her to disregard that order, it was drawn by Tammy (from [facility]) on Monday.  She asked if they needed to see patient this week. I advised her I could only make recommendations from cardiology stand point and she would need to obtain those orders from Dr [facility] office [facility] [place]). She voiced understanding.  I spoke with Tammy from [place].  She states patient is confused about medication. She states patient started Valsartan 160mg  on 11/02/2015 as recommended by Dr Johnsie Cancel.  She states pt's BP was 168/112 on 10/04 PM, 147/99 and 160/95 on 11/08/2015 AM.  She states she will visit pt tomorrow and again next week, if BPs still high she will report to [place]. Per Dr Johnsie Cancel we will monitor BP for the next couple of weeks.  Tammy voiced understanding.

## 2015-11-08 NOTE — Telephone Encounter (Signed)
New Message:     Please call when you have time,she needs to discuss this patient.

## 2015-11-08 NOTE — Telephone Encounter (Signed)
Pt calling requesting a refill on Varsartan 160 mg, per phone note on 11/01/15. This medication strength is not on pts med list, the change was not sent in to her pharmacy. Please advise

## 2015-11-08 NOTE — Telephone Encounter (Signed)
New message   [facility] and she verbalized that the pt will not let her come to her home unless rn speaks to [facility] and confirm.

## 2015-11-08 NOTE — Telephone Encounter (Signed)
Stanton Kidney, RN    [] Hide copied text [] Hover for attribution information Tammy w/ [facility] reports pts BP this afternoon: Left arm 180/96, Right arm 200/100  Yesterday Valsartan was increased from 40mg  to 80mg .  (patient was on 320mg  prior to recent adx).  Recent OV w/ [facility], on 9/15, pressure was 110/62.  Reviewed with Dr. Johnsie Cancel, [place], -- order to increase to 160 mg daily.   Advised to continue to monitor and call office if BPs remain elevated. Tammy w/ [facility] will call and advise patient.  She will recheck patient tomorrow.

## 2015-11-09 NOTE — Telephone Encounter (Signed)
Please arrange for the patient to have an office visit with one of our APP's, PharmD HTN clinic, or her PCP to get BP under control. thx

## 2015-11-09 NOTE — Telephone Encounter (Signed)
I spoke with patient and advised of Dr Antionette Char recommendation.  I sch appt w/ pharmacist 11/14/15 @ 1000. She voiced understanding and agreed with plan.

## 2015-11-13 ENCOUNTER — Other Ambulatory Visit: Payer: Self-pay | Admitting: *Deleted

## 2015-11-13 ENCOUNTER — Encounter: Payer: Self-pay | Admitting: *Deleted

## 2015-11-13 NOTE — Patient Outreach (Signed)
Final transition of care call. Tricia [name] is doing well although her blood pressure has been elevated. She has been monitoring this frequently and has made an appointment to see someone in her cardiology office tomorrow. Tricia [name] continues to monitor her weight and glucose daily. She is stable in these realms. She also is being served by [facility] now.  I have advised her that today is my last call and that it has been a pleasure working with her. I have encouraged her to continue monitoring and being active in advocating for herself.   Deloria Lair [facility] [place] [phone]

## 2015-11-14 ENCOUNTER — Ambulatory Visit (INDEPENDENT_AMBULATORY_CARE_PROVIDER_SITE_OTHER): Payer: PPO | Admitting: Pharmacist

## 2015-11-14 ENCOUNTER — Encounter: Payer: Self-pay | Admitting: *Deleted

## 2015-11-14 VITALS — BP 148/86 | HR 67 | Ht 62.0 in | Wt 186.5 lb

## 2015-11-14 DIAGNOSIS — I10 Essential (primary) hypertension: Secondary | ICD-10-CM

## 2015-11-14 NOTE — Patient Instructions (Addendum)
It was nice to meet you today.  Increase your valsartan to 320mg  daily. Increase your carvedilol to 6.25mg  twice a day.  Check your blood pressure once a day and record your readings.  Follow up in clinic in 2 weeks.

## 2015-11-14 NOTE — Progress Notes (Signed)
Patient ID: Tricia [name]                 DOB: [birth year]                      MRN: [id number]     HPI: Tricia [name] is a pleasant 70 y.o. female referred by Dr. Burt Knack to HTN clinic. PMH is significant for STEMI in August 2017, CAD s/p remote BMS to the LAD, HTN, HLD, DM2, and obesity. Pt has BP checked at home by nurse with [facility] twice a week. Recent BP had been elevated and valsartan was increased to 160mg  daily 2 weeks ago. She presents today for follow up.  Pt checks her BP 2-3x daily in addition to twice weekly checks by home nurse. Home readings range 130-160s/80-100s, HR 68-70. Pt reports she has been losing weight - down to 187 lbs which is 13 lb weight loss over the past month and 31 lb weight loss in the past 6 months. She has a very good understanding of all of her medications, indications, and doses.  BP readings in clinic: 160/82 - left arm. 148/86 - right arm. Reports that home systolic readings differ by 10 points as well.  Current HTN meds: valsartan 160mg  daily, carvedilol 3.125mg  BID Previously tried: HCTZ - dehydration BP goal: <140/66mmHg  Family History: Father with CAD, mother with colitis.  Social History: Former smoker of 1.5PPD for 35 years. Quit in February 2010. Denies alcohol or illicit drug use.  Diet: Following DM friendly diet and DASH diet. Yogurt, cheerios with skim milk, homemade chicken soup, apples/bananas, protein shake, snacks on low sodium pistachios.  Exercise: Limited by back pain. Starting cardiac rehab next month and has joined the [employer] program to start next January.  Home BP readings:   Wt Readings from Last 3 Encounters:  10/19/15 196 lb 1.9 oz (89 kg)  10/16/15 198 lb 3.2 oz (89.9 kg)  10/11/15 202 lb 8 oz (91.9 kg)   BP Readings from Last 3 Encounters:  10/19/15 110/62  10/16/15 130/64  10/11/15 100/72   Pulse Readings from Last 3 Encounters:  10/19/15 (!) 59  10/16/15 77  10/11/15 (!) 58    Renal  function: CrCl cannot be calculated (Unknown ideal weight.).  Past Medical History:  Diagnosis Date  . Adenomatous polyp of colon 02/2004  . Anemia   . Anxiety   . Arthritis   . B12 deficiency   . CAD (coronary artery disease)    a. s/p remote BMS to LAD;  b. 09/2015 Inf STEMI/VF Arrest: LM nl, LAD 85p (staged PCI 2 days later w/ 3.0x32 Synergy DES), 40p/m, 25d, LCX 45m, RCA 80ost/100p (2.75x32 Synergy DES), 27m, EF 55-65%.  . Depression   . DEPRESSION 09/22/2006   Qualifier: Diagnosis of  By: Jimmye Norman, LPN, [facility]   . Dermatophytosis of groin and perianal area   . Diet Controlled Diabetes Mellitus   . Diverticulosis   . H/O echocardiogram    a. 09/2015 Echo: EF 60-65%, no rwma, mild AI, mildly dil RA, mild to mod TR.  Marland Kitchen Hyperlipidemia   . Hypertensive heart disease   . Low back pain    l5 disc  . Morbid obesity ([place])   . Obesity   . Osteoporosis   . Sleep apnea   . Sleep apnea 10/03/2009   Resolved after gastric bypass    . TOBACCO ABUSE 10/05/2009   Qualifier: Diagnosis of  By: Stanford Breed, MD, [facility],  Pauline Good   . Ventricular fibrillation ([place]) 10/01/2015   a. In setting of inferior STEMI.    Current Outpatient Prescriptions on File Prior to Visit  Medication Sig Dispense Refill  . aspirin EC 81 MG tablet Take 81 mg by mouth daily.    Marland Kitchen atorvastatin (LIPITOR) 80 MG tablet Take 1 tablet (80 mg total) by mouth daily. 30 tablet 6  . Biotin 10000 MCG TABS Take 10,000 mcg by mouth daily. Dissolvable tablets    . carvedilol (COREG) 3.125 MG tablet Take 1 tablet (3.125 mg total) by mouth 2 (two) times daily with a meal. 60 tablet 6  . Cholecalciferol (VITAMIN D3) 2000 units CHEW Chew 2,000 Units by mouth daily.    . ciprofloxacin (CIPRO) 500 MG tablet Take 1 tablet (500 mg total) by mouth 2 (two) times daily. 11 tablet 0  . clopidogrel (PLAVIX) 75 MG tablet Take 1 tablet (75 mg total) by mouth daily. 30 tablet 6  . diazepam (VALIUM) 2 MG tablet Take 1 tablet (2 mg total) by mouth  every 6 (six) hours as needed for anxiety. 30 tablet 0  . furosemide (LASIX) 20 MG tablet Take 1 tablet (20 mg total) by mouth daily as needed.    Marland Kitchen glucose blood test strip Use as instructed 100 each 12  . HYDROcodone-acetaminophen (NORCO) 10-325 MG tablet Take 1 tablet by mouth every 6 (six) hours as needed for severe pain. 30 tablet 0  . Multiple Vitamin (MULTIVITAMIN WITH MINERALS) TABS tablet Take 1 tablet by mouth daily. Women's One a Day    . nitroGLYCERIN (NITROLINGUAL) 0.4 MG/SPRAY spray Place 1 spray under the tongue every 5 (five) minutes as needed for chest pain. 4.9 g 3  . omeprazole (PRILOSEC) 40 MG capsule TAKE 1 CAPSULE (40 MG TOTAL) BY MOUTH DAILY. 30 capsule 5  . pantoprazole (PROTONIX) 40 MG tablet Take 1 tablet (40 mg total) by mouth daily. 30 tablet 6  . valsartan (DIOVAN) 160 MG tablet Take 1 tablet (160 mg total) by mouth daily. 30 tablet 6  . VENTOLIN HFA 108 (90 BASE) MCG/ACT inhaler INHALE TWO PUFFS BY MOUTH EVERY SIX HOURS as needed for wheezing 18 g 0   No current facility-administered medications on file prior to visit.     Allergies  Allergen Reactions  . Sulfonamide Derivatives Diarrhea, Nausea Only and Other (See Comments)    Also, stomach cramps  . Sulfa Antibiotics Nausea And Vomiting and Other (See Comments)    Doesn't help     Assessment/Plan:  1. Hypertension - BP above goal <140/35mmHg. Will increase valsartan to 320mg  daily and increase carvedilol to 6.25mg  BID. Follow up in clinic in 2 weeks. Will check BMET at that time as well.   Martita Brumm E. Jory Tanguma, PharmD, [place] [id number], [place], [place] [address] Phone: [phone]; Fax: [phone] 11/14/2015 10:28 AM

## 2015-11-24 ENCOUNTER — Other Ambulatory Visit: Payer: Self-pay | Admitting: Family Medicine

## 2015-11-28 NOTE — Progress Notes (Deleted)
Patient ID: Tricia [name]                 DOB: Feb 11, 1945                      MRN: [id number]     HPI: Tricia [name] is a pleasant 70 y.o. female referred by Dr. Burt Knack to HTN clinic. PMH is significant for STEMI in August 2017, CAD s/p remote BMS to the LAD, HTN, HLD, DM2, and obesity. Pt has BP checked at home by nurse with [facility] twice a week. At last OV, valsartan was increased to 320mg  daily and carvedilol was increased to 6.25mg  BID.  Pt checks her BP 2-3x daily in addition to twice weekly checks by home nurse. Home readings range 130-160s/80-100s, HR 68-70. Pt reports she has been losing weight - down to 187 lbs which is 13 lb weight loss over the past month and 31 lb weight loss in the past 6 months. She has a very good understanding of all of her medications, indications, and doses.  BP readings in clinic: 160/82 - left arm. 148/86 - right arm. Reports that home systolic readings differ by 10 points as well. Potentially due to PAD - pt already has CAD and is treated with Lipitor 80mg  and most recent LDL was 64mg /dL at goal.  Current HTN meds: valsartan 320mg  daily, carvedilol 6.25mg  BID Previously tried: HCTZ - dehydration BP goal: <140/20mmHg  Family History: Father with CAD, mother with colitis.  Social History: Former smoker of 1.5PPD for 35 years. Quit in February 2010. Denies alcohol or illicit drug use.  Diet: Following DM friendly diet and DASH diet. Yogurt, cheerios with skim milk, homemade chicken soup, apples/bananas, protein shake, snacks on low sodium pistachios.  Exercise: Limited by back pain. Starting cardiac rehab next month and has joined the [employer] [employer] program to start next January.   Wt Readings from Last 3 Encounters:  11/14/15 186 lb 8 oz (84.6 kg)  10/19/15 196 lb 1.9 oz (89 kg)  10/16/15 198 lb 3.2 oz (89.9 kg)   BP Readings from Last 3 Encounters:  11/14/15 (!) 148/86  10/19/15 110/62  10/16/15 130/64   Pulse Readings from Last  3 Encounters:  11/14/15 67  10/19/15 (!) 59  10/16/15 77    Renal function: CrCl cannot be calculated (Unknown ideal weight.).  Past Medical History:  Diagnosis Date  . Adenomatous polyp of colon 02/2004  . Anemia   . Anxiety   . Arthritis   . B12 deficiency   . CAD (coronary artery disease)    a. s/p remote BMS to LAD;  b. 09/2015 Inf STEMI/VF Arrest: LM nl, LAD 85p (staged PCI 2 days later w/ 3.0x32 Synergy DES), 40p/m, 25d, LCX 87m, RCA 80ost/100p (2.75x32 Synergy DES), 20m, EF 55-65%.  . Depression   . DEPRESSION 09/22/2006   Qualifier: Diagnosis of  By: Jimmye Norman, LPN, [facility]   . Dermatophytosis of groin and perianal area   . Diet Controlled Diabetes Mellitus   . Diverticulosis   . H/O echocardiogram    a. 09/2015 Echo: EF 60-65%, no rwma, mild AI, mildly dil RA, mild to mod TR.  Marland Kitchen Hyperlipidemia   . Hypertensive heart disease   . Low back pain    l5 disc  . Morbid obesity ([place])   . Obesity   . Osteoporosis   . Sleep apnea   . Sleep apnea 10/03/2009   Resolved after gastric bypass    .  TOBACCO ABUSE 10/05/2009   Qualifier: Diagnosis of  By: Stanford Breed, MD, [facility] Rud   . Ventricular fibrillation ([place]) 10/01/2015   a. In setting of inferior STEMI.    Current Outpatient Prescriptions on File Prior to Visit  Medication Sig Dispense Refill  . aspirin EC 81 MG tablet Take 81 mg by mouth daily.    Marland Kitchen atorvastatin (LIPITOR) 80 MG tablet Take 1 tablet (80 mg total) by mouth daily. 30 tablet 6  . Biotin 10000 MCG TABS Take 10,000 mcg by mouth daily. Dissolvable tablets    . carvedilol (COREG) 6.25 MG tablet Take 1 tablet (6.25 mg total) by mouth 2 (two) times daily with a meal.    . Cholecalciferol (VITAMIN D3) 2000 units CHEW Chew 2,000 Units by mouth daily.    . clopidogrel (PLAVIX) 75 MG tablet Take 1 tablet (75 mg total) by mouth daily. 30 tablet 6  . [employer] ADVANCED GLUCOSE TEST test strip USE TO TEST 1 TIME DAILY AS DIRECTED 100 each 2  . diazepam (VALIUM) 2 MG  tablet Take 1 tablet (2 mg total) by mouth every 6 (six) hours as needed for anxiety. 30 tablet 0  . HYDROcodone-acetaminophen (NORCO/VICODIN) 5-325 MG tablet Take 1 tablet by mouth every 6 (six) hours as needed for moderate pain.    . Multiple Vitamin (MULTIVITAMIN WITH MINERALS) TABS tablet Take 1 tablet by mouth daily. Women's One a Day    . nitroGLYCERIN (NITROLINGUAL) 0.4 MG/SPRAY spray Place 1 spray under the tongue every 5 (five) minutes as needed for chest pain. 4.9 g 3  . pantoprazole (PROTONIX) 40 MG tablet Take 1 tablet (40 mg total) by mouth daily. 30 tablet 6  . tiZANidine (ZANAFLEX) 2 MG tablet Take by mouth every 8 (eight) hours as needed for muscle spasms.    . valsartan (DIOVAN) 320 MG tablet Take 1 tablet (320 mg total) by mouth daily.    . VENTOLIN HFA 108 (90 BASE) MCG/ACT inhaler INHALE TWO PUFFS BY MOUTH EVERY SIX HOURS as needed for wheezing 18 g 0   No current facility-administered medications on file prior to visit.     Allergies  Allergen Reactions  . Sulfonamide Derivatives Diarrhea, Nausea Only and Other (See Comments)    Also, stomach cramps  . Sulfa Antibiotics Nausea And Vomiting and Other (See Comments)    Doesn't help     Assessment/Plan:  1. Hypertension - amlodipine if needed

## 2015-11-29 ENCOUNTER — Other Ambulatory Visit: Payer: Self-pay | Admitting: Family Medicine

## 2015-11-29 ENCOUNTER — Ambulatory Visit (INDEPENDENT_AMBULATORY_CARE_PROVIDER_SITE_OTHER): Payer: PPO | Admitting: Pharmacist

## 2015-11-29 ENCOUNTER — Encounter (HOSPITAL_COMMUNITY): Payer: Self-pay

## 2015-11-29 ENCOUNTER — Ambulatory Visit: Payer: PPO

## 2015-11-29 ENCOUNTER — Encounter (HOSPITAL_COMMUNITY)
Admission: RE | Admit: 2015-11-29 | Discharge: 2015-11-29 | Disposition: A | Discharge status: Home / Self Care | Payer: PPO | Source: Ambulatory Visit | Attending: Cardiovascular Disease | Admitting: Cardiovascular Disease

## 2015-11-29 VITALS — BP 126/74 | HR 61

## 2015-11-29 VITALS — BP 118/70 | HR 66 | Ht 62.75 in | Wt 186.7 lb

## 2015-11-29 DIAGNOSIS — Z955 Presence of coronary angioplasty implant and graft: Secondary | ICD-10-CM | POA: Diagnosis not present

## 2015-11-29 DIAGNOSIS — I10 Essential (primary) hypertension: Secondary | ICD-10-CM

## 2015-11-29 DIAGNOSIS — I2119 ST elevation (STEMI) myocardial infarction involving other coronary artery of inferior wall: Secondary | ICD-10-CM | POA: Insufficient documentation

## 2015-11-29 DIAGNOSIS — Z9861 Coronary angioplasty status: Secondary | ICD-10-CM | POA: Insufficient documentation

## 2015-11-29 DIAGNOSIS — I2510 Atherosclerotic heart disease of native coronary artery without angina pectoris: Secondary | ICD-10-CM | POA: Diagnosis not present

## 2015-11-29 DIAGNOSIS — I2111 ST elevation (STEMI) myocardial infarction involving right coronary artery: Secondary | ICD-10-CM

## 2015-11-29 DIAGNOSIS — I213 ST elevation (STEMI) myocardial infarction of unspecified site: Secondary | ICD-10-CM | POA: Diagnosis not present

## 2015-11-29 DIAGNOSIS — E785 Hyperlipidemia, unspecified: Secondary | ICD-10-CM | POA: Diagnosis not present

## 2015-11-29 LAB — BASIC METABOLIC PANEL
BUN: 29 mg/dL — ABNORMAL HIGH (ref 7–25)
CO2: 22 mmol/L (ref 20–31)
Calcium: 9.6 mg/dL (ref 8.6–10.4)
Chloride: 108 mmol/L (ref 98–110)
Creat: 1.01 mg/dL — ABNORMAL HIGH (ref 0.50–0.99)
Glucose, Bld: 103 mg/dL — ABNORMAL HIGH (ref 65–99)
Potassium: 4.7 mmol/L (ref 3.5–5.3)
Sodium: 141 mmol/L (ref 135–146)

## 2015-11-29 MED ORDER — CARVEDILOL 6.25 MG PO TABS: 6.2500 mg | ORAL_TABLET | Freq: Two times a day (BID) | ORAL | 3 refills | Status: DC

## 2015-11-29 NOTE — Patient Instructions (Signed)
It was nice to see you today.  Your blood pressure was at goal < 140/49mmHg.  Continue taking your valsartan 320mg  daily and carvedilol 6.25mg  twice a day for your blood pressure.  There is a 90 day supply of your carvedilol ready at the pharmacy for you.  Follow up with Dr Burt Knack next week.

## 2015-11-29 NOTE — Progress Notes (Signed)
Cardiac Individual Treatment Plan  Patient Details  Name: Tricia [name] MRN: [id number] Date of Birth: November 10, 1945 Referring Provider:   Flowsheet Row CARDIAC REHAB PHASE II ORIENTATION from 11/29/2015 in [place]  Referring Provider  Sherren Mocha MD      Initial Encounter Date:  [place] PHASE II ORIENTATION from 11/29/2015 in [facility]  Date  11/29/15  Referring Provider  Sherren Mocha MD      Visit Diagnosis: 10/01/15 ST elevation myocardial infarction involving right coronary artery ([place])  10/03/15 Status post coronary artery stent placement LAD  Patient's Home Medications on Admission:  Current Outpatient Prescriptions:  .  aspirin EC 81 MG tablet, Take 81 mg by mouth daily., Disp: , Rfl:  .  atorvastatin (LIPITOR) 80 MG tablet, Take 1 tablet (80 mg total) by mouth daily., Disp: 30 tablet, Rfl: 6 .  Biotin 10000 MCG TABS, Take 10,000 mcg by mouth daily. Dissolvable tablets, Disp: , Rfl:  .  Cholecalciferol (VITAMIN D3) 2000 units CHEW, Chew 2,000 Units by mouth daily., Disp: , Rfl:  .  clopidogrel (PLAVIX) 75 MG tablet, Take 1 tablet (75 mg total) by mouth daily., Disp: 30 tablet, Rfl: 6 .  [employer] ADVANCED GLUCOSE TEST test strip, USE TO TEST 1 TIME DAILY AS DIRECTED, Disp: 100 each, Rfl: 2 .  diazepam (VALIUM) 2 MG tablet, Take 1 tablet (2 mg total) by mouth every 6 (six) hours as needed for anxiety., Disp: 30 tablet, Rfl: 0 .  HYDROcodone-acetaminophen (NORCO/VICODIN) 5-325 MG tablet, Take 1 tablet by mouth every 6 (six) hours as needed for moderate pain., Disp: , Rfl:  .  Multiple Vitamin (MULTIVITAMIN WITH MINERALS) TABS tablet, Take 1 tablet by mouth daily. Women's One a Day, Disp: , Rfl:  .  nitroGLYCERIN (NITROLINGUAL) 0.4 MG/SPRAY spray, Place 1 spray under the tongue every 5 (five) minutes as needed for chest pain., Disp: 4.9 g, Rfl: 3 .  pantoprazole (PROTONIX) 40 MG tablet, Take 1  tablet (40 mg total) by mouth daily., Disp: 30 tablet, Rfl: 6 .  tiZANidine (ZANAFLEX) 2 MG tablet, Take by mouth every 8 (eight) hours as needed for muscle spasms., Disp: , Rfl:  .  valsartan (DIOVAN) 320 MG tablet, Take 1 tablet (320 mg total) by mouth daily., Disp: , Rfl:  .  VENTOLIN HFA 108 (90 BASE) MCG/ACT inhaler, INHALE TWO PUFFS BY MOUTH EVERY SIX HOURS as needed for wheezing, Disp: 18 g, Rfl: 0 .  carvedilol (COREG) 6.25 MG tablet, Take 1 tablet (6.25 mg total) by mouth 2 (two) times daily with a meal., Disp: 180 tablet, Rfl: 3 .  isosorbide mononitrate (IMDUR) 60 MG 24 hr tablet, TAKE 1 TABLET (60 MG TOTAL) BY MOUTH DAILY., Disp: 30 tablet, Rfl: 2  Past Medical History: Past Medical History:  Diagnosis Date  . Adenomatous polyp of colon 02/2004  . Anemia   . Anxiety   . Arthritis   . B12 deficiency   . CAD (coronary artery disease)    a. s/p remote BMS to LAD;  b. 09/2015 Inf STEMI/VF Arrest: LM nl, LAD 85p (staged PCI 2 days later w/ 3.0x32 Synergy DES), 40p/m, 25d, LCX 42m, RCA 80ost/100p (2.75x32 Synergy DES), 49m, EF 55-65%.  . Depression   . DEPRESSION 09/22/2006   Qualifier: Diagnosis of  By: Jimmye Norman, LPN, [place] Cunas   . Dermatophytosis of groin and perianal area   . Diet Controlled Diabetes Mellitus   . Diverticulosis   .  H/O echocardiogram    a. 09/2015 Echo: EF 60-65%, no rwma, mild AI, mildly dil RA, mild to mod TR.  Marland Kitchen Hyperlipidemia   . Hypertensive heart disease   . Low back pain    l5 disc  . Morbid obesity ([place])   . Obesity   . Osteoporosis   . Sleep apnea   . Sleep apnea 10/03/2009   Resolved after gastric bypass    . TOBACCO ABUSE 10/05/2009   Qualifier: Diagnosis of  By: Stanford Breed, MD, Kandyce Rud   . Ventricular fibrillation ([place]) 10/01/2015   a. In setting of inferior STEMI.    Tobacco Use: History  Smoking Status  . Former Smoker  . Packs/day: 1.50  . Years: 35.00  . Types: Cigarettes  . Quit date: 03/06/2008  Smokeless Tobacco  . Never  Used    Labs: Recent Review Flowsheet Data    Labs for ITP Cardiac and Pulmonary Rehab Latest Ref Rng & Units 09/20/2012 08/19/2013 06/08/2015 09/11/2015 10/01/2015   Cholestrol 0 - 200 mg/dL - 223(H) 162 - 124   LDLCALC 0 - 99 mg/dL - 142(H) 87 - 64   LDLDIRECT mg/dL 160.7 - - 86.0 -   HDL >40 mg/dL - 37.10(L) 37.80(L) - 29(L)   Trlycerides <150 mg/dL - 220.0(H) 186.0(H) - 157(H)   Hemoglobin A1c 4.6 - 6.5 % 6.0 6.1 6.6(H) 5.6 -   TCO2 0 - 100 mmol/L - - - - 19      Capillary Blood Glucose: Lab Results  Component Value Date   GLUCAP 106 (H) 10/11/2015   GLUCAP 95 10/11/2015   GLUCAP 94 10/10/2015   GLUCAP 98 10/10/2015   GLUCAP 98 10/10/2015     Exercise Target Goals: Date: 11/29/15  Exercise Program Goal: Individual exercise prescription set with THRR, safety & activity barriers. Participant demonstrates ability to understand and report RPE using BORG scale, to self-measure pulse accurately, and to acknowledge the importance of the exercise prescription.  Exercise Prescription Goal: Starting with aerobic activity 30 plus minutes a day, 3 days per week for initial exercise prescription. Provide home exercise prescription and guidelines that participant acknowledges understanding prior to discharge.  Activity Barriers & Risk Stratification:     Activity Barriers & Cardiac Risk Stratification - 11/29/15 1018      Activity Barriers & Cardiac Risk Stratification   Cardiac Risk Stratification High      6 Minute Walk:     6 Minute Walk    Row Name 11/29/15 1247         6 Minute Walk   Phase Initial     Distance 1306 feet     Walk Time 6 minutes     # of Rest Breaks 0     MPH 2.5     METS 2.7     RPE 12     Perceived Dyspnea  0     VO2 Peak 9.6     Symptoms No     Resting HR 66 bpm     Resting BP 118/70     Max Ex. HR 96 bpm     Max Ex. BP 150/80     2 Minute Post BP 108/70        Initial Exercise Prescription:     Initial Exercise Prescription -  11/29/15 1200      Date of Initial Exercise RX and Referring Provider   Date 11/29/15   Referring Provider Sherren Mocha MD     Recumbant Bike  Level 1   Minutes 10   METs 2.5     NuStep   Level 1   Minutes 10   METs 1.7     Track   Laps 10   Minutes 10   METs 2.74     Prescription Details   Frequency (times per week) 3   Duration Progress to 45 minutes of aerobic exercise without signs/symptoms of physical distress     Intensity   THRR 40-80% of Max Heartrate 61-122   Ratings of Perceived Exertion 11-13   Perceived Dyspnea 0-4     Progression   Progression Continue to progress workloads to maintain intensity without signs/symptoms of physical distress.     Resistance Training   Training Prescription Yes   Weight 1lb   Reps 10-12      Perform Capillary Blood Glucose checks as needed.  Exercise Prescription Changes:   Exercise Comments:   Discharge Exercise Prescription (Final Exercise Prescription Changes):   Nutrition:  Target Goals: Understanding of nutrition guidelines, daily intake of sodium 1500mg , cholesterol 200mg , calories 30% from fat and 7% or less from saturated fats, daily to have 5 or more servings of fruits and vegetables.  Biometrics:     Pre Biometrics - 11/29/15 1224      Pre Biometrics   Flexibility 7.5 in   Single Leg Stand 3.68 seconds       Nutrition Therapy Plan and Nutrition Goals:     Nutrition Therapy & Goals - 11/29/15 1416      Nutrition Therapy   Diet Carb Modified, Therapeutic Lifestyle Changes     Personal Nutrition Goals   Personal Goal #1 1-2 lb wt loss/week to a wt loss goal of 6-24 lb at graduation from [facility], educate and counsel regarding individualized specific dietary modifications aiming towards targeted core components such as weight, hypertension, lipid management, diabetes, heart failure and other comorbidities.   Expected Outcomes Short  Term Goal: Understand basic principles of dietary content, such as calories, fat, sodium, cholesterol and nutrients.;Long Term Goal: Adherence to prescribed nutrition plan.      Nutrition Discharge: Nutrition Scores:     Nutrition Assessments - 11/29/15 1415      MEDFICTS Scores   Pre Score 12      Nutrition Goals Re-Evaluation:   Psychosocial: Target Goals: Acknowledge presence or absence of depression, maximize coping skills, provide positive support system. Participant is able to verbalize types and ability to use techniques and skills needed for reducing stress and depression.  Initial Review & Psychosocial Screening:     Initial Psych Review & Screening - 11/29/15 1515      Initial Review   Current issues with Current Anxiety/Panic     Family Dynamics   Good Support System? Yes   Comments Brief initial review, pt with some anxiety related to participating in cardiac rehab. Offered support and encouragement.     Barriers   Psychosocial barriers to participate in program The patient should benefit from training in stress management and relaxation.     Screening Interventions   Interventions Encouraged to exercise      Quality of Life Scores:     Quality of Life - 11/29/15 1224      Quality of Life Scores   Health/Function Pre 23.92 %   Socioeconomic Pre 21 %   Psych/Spiritual Pre 24.2 %   Family Pre 28.5 %   GLOBAL Pre 23.92 %  PHQ-9: Recent Review Flowsheet Data    Depression screen [facility] 2/9 11/29/2015 10/15/2015 06/22/2015 06/08/2015 03/14/2014   Decreased Interest 0 1 0 0 0   Down, Depressed, Hopeless 0 1 0 0 0   PHQ - 2 Score 0 2 0 0 0   Altered sleeping - 0 - - -   Tired, decreased energy - 0 - - -   Change in appetite - 3 - - -   Feeling bad or failure about yourself  - 0 - - -   Trouble concentrating - 1 - - -   Moving slowly or fidgety/restless - 0 - - -   Suicidal thoughts - 0 - - -   PHQ-9 Score - 6 - - -   Difficult doing work/chores -  Not difficult at all - - -      Psychosocial Evaluation and Intervention:   Psychosocial Re-Evaluation:   Vocational Rehabilitation: Provide vocational rehab assistance to qualifying candidates.   Vocational Rehab Evaluation & Intervention:     Vocational Rehab - 11/29/15 1546      Initial Vocational Rehab Evaluation & Intervention   Assessment shows need for Vocational Rehabilitation No  Pt does not plan to return to work. Pt is retired.       Education: Education Goals: Education classes will be provided on a weekly basis, covering required topics. Participant will state understanding/return demonstration of topics presented.  Learning Barriers/Preferences:     Learning Barriers/Preferences - 11/29/15 [id number]      Learning Barriers/Preferences   Learning Barriers Sight   Learning Preferences Group Instruction;Individual Instruction;Skilled Demonstration;Verbal Instruction;Audio      Education Topics: Count Your Pulse:  -Group instruction provided by verbal instruction, demonstration, patient participation and written materials to support subject.  Instructors address importance of being able to find your pulse and how to count your pulse when at home without a heart monitor.  Patients get hands on experience counting their pulse with staff help and individually.   Heart Attack, Angina, and Risk Factor Modification:  -Group instruction provided by verbal instruction, video, and written materials to support subject.  Instructors address signs and symptoms of angina and heart attacks.    Also discuss risk factors for heart disease and how to make changes to improve heart health risk factors.   Functional Fitness:  -Group instruction provided by verbal instruction, demonstration, patient participation, and written materials to support subject.  Instructors address safety measures for doing things around the house.  Discuss how to get up and down off the floor, how to pick  things up properly, how to safely get out of a chair without assistance, and balance training.   Meditation and Mindfulness:  -Group instruction provided by verbal instruction, patient participation, and written materials to support subject.  Instructor addresses importance of mindfulness and meditation practice to help reduce stress and improve awareness.  Instructor also leads participants through a meditation exercise.    Stretching for Flexibility and Mobility:  -Group instruction provided by verbal instruction, patient participation, and written materials to support subject.  Instructors lead participants through series of stretches that are designed to increase flexibility thus improving mobility.  These stretches are additional exercise for major muscle groups that are typically performed during regular warm up and cool down.   Hands Only CPR Anytime:  -Group instruction provided by verbal instruction, video, patient participation and written materials to support subject.  Instructors co-teach with AHA video for hands only CPR.  Participants get hands on  experience with mannequins.   Nutrition I class: Heart Healthy Eating:  -Group instruction provided by PowerPoint slides, verbal discussion, and written materials to support subject matter. The instructor gives an explanation and review of the Therapeutic Lifestyle Changes diet recommendations, which includes a discussion on lipid goals, dietary fat, sodium, fiber, plant stanol/sterol esters, sugar, and the components of a well-balanced, healthy diet.   Nutrition II class: Lifestyle Skills:  -Group instruction provided by PowerPoint slides, verbal discussion, and written materials to support subject matter. The instructor gives an explanation and review of label reading, grocery shopping for heart health, heart healthy recipe modifications, and ways to make healthier choices when eating out.   Diabetes Question & Answer:  -Group  instruction provided by PowerPoint slides, verbal discussion, and written materials to support subject matter. The instructor gives an explanation and review of diabetes co-morbidities, pre- and post-prandial blood glucose goals, pre-exercise blood glucose goals, signs, symptoms, and treatment of hypoglycemia and hyperglycemia, and foot care basics.   Diabetes Blitz:  -Group instruction provided by PowerPoint slides, verbal discussion, and written materials to support subject matter. The instructor gives an explanation and review of the physiology behind type 1 and type 2 diabetes, diabetes medications and rational behind using different medications, pre- and post-prandial blood glucose recommendations and Hemoglobin A1c goals, diabetes diet, and exercise including blood glucose guidelines for exercising safely.    Portion Distortion:  -Group instruction provided by PowerPoint slides, verbal discussion, written materials, and food models to support subject matter. The instructor gives an explanation of serving size versus portion size, changes in portions sizes over the last 20 years, and what consists of a serving from each food group.   Stress Management:  -Group instruction provided by verbal instruction, video, and written materials to support subject matter.  Instructors review role of stress in heart disease and how to cope with stress positively.     Exercising on Your Own:  -Group instruction provided by verbal instruction, power point, and written materials to support subject.  Instructors discuss benefits of exercise, components of exercise, frequency and intensity of exercise, and end points for exercise.  Also discuss use of nitroglycerin and activating EMS.  Review options of places to exercise outside of rehab.  Review guidelines for sex with heart disease.   Cardiac Drugs I:  -Group instruction provided by verbal instruction and written materials to support subject.  Instructor  reviews cardiac drug classes: antiplatelets, anticoagulants, beta blockers, and statins.  Instructor discusses reasons, side effects, and lifestyle considerations for each drug class.   Cardiac Drugs II:  -Group instruction provided by verbal instruction and written materials to support subject.  Instructor reviews cardiac drug classes: angiotensin converting enzyme inhibitors (ACE-I), angiotensin II receptor blockers (ARBs), nitrates, and calcium channel blockers.  Instructor discusses reasons, side effects, and lifestyle considerations for each drug class.   Anatomy and Physiology of the Circulatory System:  -Group instruction provided by verbal instruction, video, and written materials to support subject.  Reviews functional anatomy of heart, how it relates to various diagnoses, and what role the heart plays in the overall system.   Knowledge Questionnaire Score:     Knowledge Questionnaire Score - 11/29/15 1217      Knowledge Questionnaire Score   Pre Score 19/24      Core Components/Risk Factors/Patient Goals at Admission:     Personal Goals and Risk Factors at Admission - 11/29/15 1019      Core Components/Risk Factors/Patient Goals on Admission  Expected Outcomes weight loss and increase overall functional capacity       Core Components/Risk Factors/Patient Goals Review:      Goals and Risk Factor Review    Row Name 11/29/15 0830 11/29/15 0843           Core Components/Risk Factors/Patient Goals Review   Personal Goals Review Weight Management/Obesity;Other  -      Review Lose weigt, 20 lbs in three months and 60lbs long term.  Be able to walk longer distance w/o SOB Lose weigt, 20 lbs in three months and 60lbs long term.  Be able to walk longer distance w/o SOB, increase leg strength and balance      Expected Outcomes Weight loss, increase stamina and overall functional capacity  Weight loss, increase stamina, balance, leg strength and overall functional capacity           Core Components/Risk Factors/Patient Goals at Discharge (Final Review):      Goals and Risk Factor Review - 11/29/15 0843      Core Components/Risk Factors/Patient Goals Review   Review Lose weigt, 20 lbs in three months and 60lbs long term.  Be able to walk longer distance w/o SOB, increase leg strength and balance   Expected Outcomes Weight loss, increase stamina, balance, leg strength and overall functional capacity       ITP Comments:     ITP Comments    Row Name 11/29/15 0836 11/29/15 1501         ITP Comments Dr. Alfredia Ferguson -  Dr. Fransico Him, Medical Director for Cardiac Rehab         Comments:  Pt in today for cardiac rehab phase II orientation from 0800 to 1045.  As a part of cardiac rehab orientation, pt completed stretching exercise and 6 minute walk test. Monitor showed SR with no noted ectopy  Pt tolerated well with no complaints.  Pt surprised herself that she was able to ambulate as well as she did.  Brief psychosocial assessment revealed some anxiety regarding the rehab program however pt felt better after going through the orientation appointment.  Pt is eager to "get going" with the rehab program.  Maurice Small RN, BSN

## 2015-11-29 NOTE — Progress Notes (Signed)
Cardiac Rehab Medication Review by a Pharmacist  Does the patient  feel that his/her medications are working for him/her?  yes  Has the patient been experiencing any side effects to the medications prescribed?  no  Does the patient measure his/her own blood pressure or blood glucose at home?  yes   Does the patient have any problems obtaining medications due to transportation or finances?   no  Understanding of regimen: excellent Understanding of indications: excellent Potential of compliance: excellent    Pharmacist comments: Pt knows the medications she takes and what time off the top of her head. She uses a pill box to help with adherence and denies missing any doses. She is not currently experiencing any side effects with the recent increase in atorvastatin dose. Pt checks blood glucose every morning, runs 90s-100s, controls her diabetes with diet. She also checks her blood pressure daily and says it has been better controlled after carvedilol dose increase.    Gwenlyn Perking, PharmD PGY1 Pharmacy Resident Pager: [phone] 11/29/2015 8:04 AM'

## 2015-11-29 NOTE — Progress Notes (Signed)
Patient ID: Tricia [name]                 DOB: Jan 07, 1946                      MRN: [id number]     HPI: Tricia [name] is a pleasant 70 y.o. female referred by Dr. Burt Knack to HTN clinic. PMH is significant for STEMI in August 2017, CAD s/p remote BMS to the LAD, HTN, HLD, DM2, and obesity. Pt has BP checked at home by nurse with [facility] twice a week. At last OV, valsartan was increased to 320mg  daily and carvedilol was increased to 6.25mg  BID. Patient presents today for BP check and BMET.  Home readings have been 120-130s/80s with self checks and with home nurse checks. Pt denies dizziness, headache, blurred vision, or falls. She has a very good understanding of all of her medications, indications, and doses.  Current HTN meds: valsartan 320mg  daily, carvedilol 6.25mg  BID Previously tried: HCTZ - dehydration BP goal: <140/66mmHg  Family History: Father with CAD, mother with colitis.  Social History: Former smoker of 1.5PPD for 35 years. Quit in February 2010. Denies alcohol or illicit drug use.  Diet: Following DM friendly diet and DASH diet. Yogurt, cheerios with skim milk, homemade chicken soup, apples/bananas, protein shake, snacks on low sodium pistachios.  Exercise: Limited by back pain. Starting cardiac rehab next month and has joined the [employer] program to start next January.  Wt Readings from Last 3 Encounters:  11/14/15 186 lb 8 oz (84.6 kg)  10/19/15 196 lb 1.9 oz (89 kg)  10/16/15 198 lb 3.2 oz (89.9 kg)   BP Readings from Last 3 Encounters:  11/14/15 (!) 148/86  10/19/15 110/62  10/16/15 130/64   Pulse Readings from Last 3 Encounters:  11/14/15 67  10/19/15 (!) 59  10/16/15 77    Renal function: CrCl cannot be calculated (Unknown ideal weight.).  Past Medical History:  Diagnosis Date  . Adenomatous polyp of colon 02/2004  . Anemia   . Anxiety   . Arthritis   . B12 deficiency   . CAD (coronary artery disease)    a. s/p remote BMS to LAD;   b. 09/2015 Inf STEMI/VF Arrest: LM nl, LAD 85p (staged PCI 2 days later w/ 3.0x32 Synergy DES), 40p/m, 25d, LCX 49m, RCA 80ost/100p (2.75x32 Synergy DES), 61m, EF 55-65%.  . Depression   . DEPRESSION 09/22/2006   Qualifier: Diagnosis of  By: Jimmye Norman, LPN, Winfield Cunas   . Dermatophytosis of groin and perianal area   . Diet Controlled Diabetes Mellitus   . Diverticulosis   . H/O echocardiogram    a. 09/2015 Echo: EF 60-65%, no rwma, mild AI, mildly dil RA, mild to mod TR.  Marland Kitchen Hyperlipidemia   . Hypertensive heart disease   . Low back pain    l5 disc  . Morbid obesity ([place])   . Obesity   . Osteoporosis   . Sleep apnea   . Sleep apnea 10/03/2009   Resolved after gastric bypass    . TOBACCO ABUSE 10/05/2009   Qualifier: Diagnosis of  By: Stanford Breed, MD, Kandyce Rud   . Ventricular fibrillation ([place]) 10/01/2015   a. In setting of inferior STEMI.    Current Outpatient Prescriptions on File Prior to Visit  Medication Sig Dispense Refill  . aspirin EC 81 MG tablet Take 81 mg by mouth daily.    Marland Kitchen atorvastatin (LIPITOR) 80 MG tablet Take 1 tablet (  80 mg total) by mouth daily. 30 tablet 6  . Biotin 10000 MCG TABS Take 10,000 mcg by mouth daily. Dissolvable tablets    . carvedilol (COREG) 6.25 MG tablet Take 1 tablet (6.25 mg total) by mouth 2 (two) times daily with a meal.    . Cholecalciferol (VITAMIN D3) 2000 units CHEW Chew 2,000 Units by mouth daily.    . clopidogrel (PLAVIX) 75 MG tablet Take 1 tablet (75 mg total) by mouth daily. 30 tablet 6  . [facility] ADVANCED GLUCOSE TEST test strip USE TO TEST 1 TIME DAILY AS DIRECTED 100 each 2  . diazepam (VALIUM) 2 MG tablet Take 1 tablet (2 mg total) by mouth every 6 (six) hours as needed for anxiety. 30 tablet 0  . HYDROcodone-acetaminophen (NORCO/VICODIN) 5-325 MG tablet Take 1 tablet by mouth every 6 (six) hours as needed for moderate pain.    . Multiple Vitamin (MULTIVITAMIN WITH MINERALS) TABS tablet Take 1 tablet by mouth daily. Women's One a Day     . nitroGLYCERIN (NITROLINGUAL) 0.4 MG/SPRAY spray Place 1 spray under the tongue every 5 (five) minutes as needed for chest pain. 4.9 g 3  . pantoprazole (PROTONIX) 40 MG tablet Take 1 tablet (40 mg total) by mouth daily. 30 tablet 6  . tiZANidine (ZANAFLEX) 2 MG tablet Take by mouth every 8 (eight) hours as needed for muscle spasms.    . valsartan (DIOVAN) 320 MG tablet Take 1 tablet (320 mg total) by mouth daily.    . VENTOLIN HFA 108 (90 BASE) MCG/ACT inhaler INHALE TWO PUFFS BY MOUTH EVERY SIX HOURS as needed for wheezing 18 g 0   No current facility-administered medications on file prior to visit.     Allergies  Allergen Reactions  . Sulfa Antibiotics Diarrhea and Nausea And Vomiting     Assessment/Plan:  1. Hypertension - BP at goal <140/58mmHg. Pt will continue valsartan 320mg  daily and carvedilol 6.25mg  BID. Will check BMET today since increasing ARB dose. Pt has follow up with Dr Burt Knack next week.   Lycan Davee E. Jaylenn Baiza, PharmD, [place] [id number] [place], [place] [address] Phone: [phone]; Fax: [phone] 11/29/2015 10:04 AM

## 2015-12-03 ENCOUNTER — Other Ambulatory Visit: Payer: Self-pay

## 2015-12-03 ENCOUNTER — Encounter (HOSPITAL_COMMUNITY): Payer: PPO

## 2015-12-03 ENCOUNTER — Encounter (HOSPITAL_COMMUNITY)
Admission: RE | Admit: 2015-12-03 | Discharge: 2015-12-03 | Disposition: A | Discharge status: Home / Self Care | Payer: PPO | Source: Ambulatory Visit | Attending: Cardiovascular Disease | Admitting: Cardiovascular Disease

## 2015-12-03 DIAGNOSIS — Z955 Presence of coronary angioplasty implant and graft: Secondary | ICD-10-CM

## 2015-12-03 DIAGNOSIS — I2111 ST elevation (STEMI) myocardial infarction involving right coronary artery: Secondary | ICD-10-CM

## 2015-12-03 DIAGNOSIS — I2119 ST elevation (STEMI) myocardial infarction involving other coronary artery of inferior wall: Secondary | ICD-10-CM | POA: Diagnosis not present

## 2015-12-03 LAB — GLUCOSE, CAPILLARY
Glucose-Capillary: 80 mg/dL (ref 65–99)
Glucose-Capillary: 102 mg/dL — ABNORMAL HIGH (ref 65–99)

## 2015-12-03 MED ORDER — GLUCOSE BLOOD VI STRP: ORAL_STRIP | 2 refills | Status: DC

## 2015-12-03 NOTE — Progress Notes (Signed)
Daily Session Note  Patient Details  Name: Tricia [name] MRN: [id number] Date of Birth: [birth year] Referring Provider:   Flowsheet Row CARDIAC REHAB PHASE II ORIENTATION from 11/29/2015 in [facility]  Referring Provider  Sherren Mocha MD      Encounter Date: 12/03/2015  Check In:   Capillary Blood Glucose: Results for orders placed or performed during the hospital encounter of 12/03/15 (from the past 24 hour(s))  Glucose, capillary     Status: None   Collection Time: 12/03/15 11:28 AM  Result Value Ref Range   Glucose-Capillary 80 65 - 99 mg/dL     Goals Met:  Exercise tolerated well  Goals Unmet:  Not Applicable  Comments: Pt started cardiac rehab today.  Pt tolerated light exercise without difficulty. VSS, telemetry-Sinus Rhtyhm, asymptomatic.  Medication list reconciled. Pt denies barriers to medicaiton compliance.  PSYCHOSOCIAL ASSESSMENT:  PHQ-0. Pt exhibits positive coping skills, hopeful outlook with supportive family. No psychosocial needs identified at this time, no psychosocial interventions necessary.    Pt enjoys reading and playing computer games.   Pt oriented to exercise equipment and routine.    Understanding verbalized. Initial CBG was 80. Tricia [name] is a diet controlled diabetic. Patient was given graham crackers. Repeat CBG 102. Patient was counseled by the dietitian. Will continue to monitor the patient throughout  the program.    Dr. Fransico Him is Medical Director for Cardiac Rehab at [facility].

## 2015-12-05 ENCOUNTER — Encounter (HOSPITAL_COMMUNITY): Payer: PPO

## 2015-12-05 ENCOUNTER — Encounter (HOSPITAL_COMMUNITY)
Admission: RE | Admit: 2015-12-05 | Discharge: 2015-12-05 | Disposition: A | Discharge status: Home / Self Care | Payer: PPO | Source: Ambulatory Visit | Attending: Cardiovascular Disease | Admitting: Cardiovascular Disease

## 2015-12-05 DIAGNOSIS — Z955 Presence of coronary angioplasty implant and graft: Secondary | ICD-10-CM | POA: Diagnosis not present

## 2015-12-05 DIAGNOSIS — I213 ST elevation (STEMI) myocardial infarction of unspecified site: Secondary | ICD-10-CM | POA: Diagnosis not present

## 2015-12-05 DIAGNOSIS — I2510 Atherosclerotic heart disease of native coronary artery without angina pectoris: Secondary | ICD-10-CM | POA: Diagnosis not present

## 2015-12-05 DIAGNOSIS — Z9861 Coronary angioplasty status: Secondary | ICD-10-CM | POA: Insufficient documentation

## 2015-12-05 DIAGNOSIS — E785 Hyperlipidemia, unspecified: Secondary | ICD-10-CM | POA: Insufficient documentation

## 2015-12-05 DIAGNOSIS — I10 Essential (primary) hypertension: Secondary | ICD-10-CM | POA: Diagnosis not present

## 2015-12-05 DIAGNOSIS — I2119 ST elevation (STEMI) myocardial infarction involving other coronary artery of inferior wall: Secondary | ICD-10-CM | POA: Insufficient documentation

## 2015-12-05 DIAGNOSIS — I2111 ST elevation (STEMI) myocardial infarction involving right coronary artery: Secondary | ICD-10-CM

## 2015-12-05 LAB — GLUCOSE, CAPILLARY: Glucose-Capillary: 75 mg/dL (ref 65–99)

## 2015-12-06 LAB — GLUCOSE, CAPILLARY: Glucose-Capillary: 117 mg/dL — ABNORMAL HIGH (ref 65–99)

## 2015-12-06 NOTE — Progress Notes (Signed)
Psychosocial Assessment QUALITY OF LIFE SCORE REVIEW  Pt completed Quality of Life survey as a participant in Cardiac Rehab. Scores 21.0 or below are considered low. Pt scored the following     Quality of Life - 11/29/15 1224      Quality of Life Scores   Health/Function Pre 23.92 %   Socioeconomic Pre 21 %   Psych/Spiritual Pre 24.2 %   Family Pre 28.5 %   GLOBAL Pre 23.92 %    Pt scored well above the low threshold.  Pt denies any complaints or needs at this time.  Pt demonstrates positive and healthy outlook on life with supportive family.  Will continue to monitor and intervene as necessary. Phu Record Elana Alm, BSN;

## 2015-12-07 ENCOUNTER — Encounter (HOSPITAL_COMMUNITY): Payer: PPO

## 2015-12-07 ENCOUNTER — Encounter (HOSPITAL_COMMUNITY)
Admission: RE | Admit: 2015-12-07 | Discharge: 2015-12-07 | Disposition: A | Discharge status: Home / Self Care | Payer: PPO | Source: Ambulatory Visit | Attending: Cardiovascular Disease | Admitting: Cardiovascular Disease

## 2015-12-07 DIAGNOSIS — I2111 ST elevation (STEMI) myocardial infarction involving right coronary artery: Secondary | ICD-10-CM

## 2015-12-07 DIAGNOSIS — Z955 Presence of coronary angioplasty implant and graft: Secondary | ICD-10-CM

## 2015-12-07 DIAGNOSIS — I2119 ST elevation (STEMI) myocardial infarction involving other coronary artery of inferior wall: Secondary | ICD-10-CM | POA: Diagnosis not present

## 2015-12-10 ENCOUNTER — Ambulatory Visit (INDEPENDENT_AMBULATORY_CARE_PROVIDER_SITE_OTHER): Payer: PPO | Admitting: Cardiovascular Disease

## 2015-12-10 ENCOUNTER — Encounter: Payer: Self-pay | Admitting: Cardiovascular Disease

## 2015-12-10 ENCOUNTER — Encounter (HOSPITAL_COMMUNITY)
Admission: RE | Admit: 2015-12-10 | Discharge: 2015-12-10 | Disposition: A | Discharge status: Home / Self Care | Payer: PPO | Source: Ambulatory Visit | Attending: Cardiovascular Disease | Admitting: Cardiovascular Disease

## 2015-12-10 ENCOUNTER — Encounter (HOSPITAL_COMMUNITY): Payer: PPO

## 2015-12-10 VITALS — BP 120/80 | HR 58 | Ht 62.75 in | Wt 184.8 lb

## 2015-12-10 DIAGNOSIS — I10 Essential (primary) hypertension: Secondary | ICD-10-CM | POA: Diagnosis not present

## 2015-12-10 DIAGNOSIS — Z955 Presence of coronary angioplasty implant and graft: Secondary | ICD-10-CM

## 2015-12-10 DIAGNOSIS — I2119 ST elevation (STEMI) myocardial infarction involving other coronary artery of inferior wall: Secondary | ICD-10-CM | POA: Diagnosis not present

## 2015-12-10 DIAGNOSIS — E785 Hyperlipidemia, unspecified: Secondary | ICD-10-CM | POA: Diagnosis not present

## 2015-12-10 DIAGNOSIS — I2111 ST elevation (STEMI) myocardial infarction involving right coronary artery: Secondary | ICD-10-CM

## 2015-12-10 NOTE — Patient Instructions (Signed)
Medication Instructions:  Your physician recommends that you continue on your current medications as directed. Please refer to the Current Medication list given to you today.  Labwork: Your physician recommends that you return for a FASTING LIPID and LIVER in 3 MONTHS--nothing to eat or drink after midnight, lab opens at 7:30 AM  Testing/Procedures: No new orders.   Follow-Up: Your physician recommends that you schedule a follow-up appointment in: 3 MONTHS with Richardson Dopp PA-C   Any Other Special Instructions Will Be Listed Below (If Applicable).     If you need a refill on your cardiac medications before your next appointment, please call your pharmacy.

## 2015-12-10 NOTE — Progress Notes (Signed)
Cardiology Office Note Date:  12/10/2015   ID:  Tricia [name], DOB [birth year], MRN [id number]  PCP:  Tricia Reddish, MD  Cardiologist:  Tricia Mocha, MD    Chief Complaint  Patient presents with  . Coronary Artery Disease   History of Present Illness: Tricia [name] is a 70 y.o. female who presents for follow-up evaluation. The patient has coronary artery disease with remote stenting of the LAD. She was hospitalized in August with an inferior STEMI complicated by VF arrest. She underwent primary PCI the right coronary artery in staged intervention of the proximal LAD. She had significant chest pain related to CPR with rib and sternal fractures.  He is doing really well now. States that her sternal and rib pain has completely resolved. She is enrolled in cardiac rehabilitation and started last week. She denies any chest pain, chest pressure, edema, orthopnea, PND, or heart palpitations. She complains of mild shortness of breath with physical activity. Otherwise doing well. She notes that since her diet is changed she's gained some weight and her blood sugars have gone up. She has shifted from a protein-based diet to a "heart healthy" diet which has more carbohydrate.  Past Medical History:  Diagnosis Date  . Adenomatous polyp of colon 02/2004  . Anemia   . Anxiety   . Arthritis   . B12 deficiency   . CAD (coronary artery disease)    a. s/p remote BMS to LAD;  b. 09/2015 Inf STEMI/VF Arrest: LM nl, LAD 85p (staged PCI 2 days later w/ 3.0x32 Synergy DES), 40p/m, 25d, LCX 58m, RCA 80ost/100p (2.75x32 Synergy DES), 38m, EF 55-65%.  . Depression   . DEPRESSION 09/22/2006   Qualifier: Diagnosis of  By: Tricia Norman, LPN, Tricia [name]   . Dermatophytosis of groin and perianal area   . Diet Controlled Diabetes Mellitus   . Diverticulosis   . H/O echocardiogram    a. 09/2015 Echo: EF 60-65%, no rwma, mild AI, mildly dil RA, mild to mod TR.  Marland Kitchen Hyperlipidemia   . Hypertensive heart disease   . Low  back pain    l5 disc  . Morbid obesity ([place])   . Obesity   . Osteoporosis   . Sleep apnea   . Sleep apnea 10/03/2009   Resolved after gastric bypass    . TOBACCO ABUSE 10/05/2009   Qualifier: Diagnosis of  By: Tricia Breed, MD, Tricia [name]   . Ventricular fibrillation ([place]) 10/01/2015   a. In setting of inferior STEMI.    Past Surgical History:  Procedure Laterality Date  . ABDOMINAL HYSTERECTOMY    . APPENDECTOMY    . BARIATRIC SURGERY    . CARDIAC CATHETERIZATION N/A 10/01/2015   Procedure: Left Heart Cath and Coronary Angiography;  Surgeon: Tricia Man, MD;  Location: [facility];  Service: Cardiovascular;  Laterality: N/A;  . CARDIAC CATHETERIZATION N/A 10/01/2015   Procedure: Coronary Stent Intervention;  Surgeon: Tricia Man, MD;  Location: [facility];  Service: Cardiovascular;  Laterality: N/A;  . CARDIAC CATHETERIZATION N/A 10/03/2015   Procedure: Coronary Stent Intervention;  Surgeon: Tricia Sine, MD;  Location: [facility];  Service: Cardiovascular;  Laterality: N/A;  . CARDIAC CATHETERIZATION N/A 10/03/2015   Procedure: Coronary/Graft Angiography;  Surgeon: Tricia Sine, MD;  Location: [facility];  Service: Cardiovascular;  Laterality: N/A;  . CARPAL TUNNEL RELEASE     bilateral  . CERVICAL DISC SURGERY    . CHOLECYSTECTOMY    .  CORONARY ANGIOPLASTY  2002   2 times  . HERNIA REPAIR    . LUMBAR LAMINECTOMY     l3-4  . NEPHRECTOMY     partial  . TONSILLECTOMY    . TOTAL KNEE ARTHROPLASTY     L  . TUBAL LIGATION    . ulnar nerve release     and thumb surgery    Current Outpatient Prescriptions  Medication Sig Dispense Refill  . aspirin EC 81 MG tablet Take 81 mg by mouth daily.    Marland Kitchen atorvastatin (LIPITOR) 80 MG tablet Take 1 tablet (80 mg total) by mouth daily. 30 tablet 6  . Biotin 10000 MCG TABS Take 10,000 mcg by mouth daily. Dissolvable tablets    . carvedilol (COREG) 6.25 MG tablet Take 1 tablet (6.25 mg total)  by mouth 2 (two) times daily with a meal. 180 tablet 3  . Cholecalciferol (VITAMIN D3) 2000 units CHEW Chew 2,000 Units by mouth daily.    . clopidogrel (PLAVIX) 75 MG tablet Take 1 tablet (75 mg total) by mouth daily. 30 tablet 6  . diazepam (VALIUM) 2 MG tablet Take 1 tablet (2 mg total) by mouth every 6 (six) hours as needed for anxiety. 30 tablet 0  . glucose blood ([facility]S ADVANCED GLUCOSE TEST) test strip USE TO TEST 1 TIME DAILY AS DIRECTED E11.22, N18.1 100 each 2  . HYDROcodone-acetaminophen (NORCO/VICODIN) 5-325 MG tablet Take 1 tablet by mouth every 6 (six) hours as needed for moderate pain.    . Multiple Vitamin (MULTIVITAMIN WITH MINERALS) TABS tablet Take 1 tablet by mouth daily. Women's One a Day    . nitroGLYCERIN (NITROLINGUAL) 0.4 MG/SPRAY spray Place 1 spray under the tongue every 5 (five) minutes as needed for chest pain. 4.9 g 3  . pantoprazole (PROTONIX) 40 MG tablet Take 1 tablet (40 mg total) by mouth daily. 30 tablet 6  . tiZANidine (ZANAFLEX) 2 MG tablet Take by mouth every 8 (eight) hours as needed for muscle spasms.    . valsartan (DIOVAN) 320 MG tablet Take 1 tablet (320 mg total) by mouth daily.    . VENTOLIN HFA 108 (90 BASE) MCG/ACT inhaler INHALE TWO PUFFS BY MOUTH EVERY SIX HOURS as needed for wheezing 18 g 0   No current facility-administered medications for this visit.     Allergies:   Sulfa antibiotics   Social History:  The patient  reports that she quit smoking about 7 years ago. Her smoking use included Cigarettes. She has a 52.50 pack-year smoking history. She has never used smokeless tobacco. She reports that she does not drink alcohol or use drugs.   Family History:  The patient's  family history includes Colitis in her mother; Heart disease in her father; Stomach cancer in her maternal uncle.    ROS:  Please see the history of present illness.  Otherwise, review of systems is positive for back pain, easy bruising.  All other systems are reviewed and  negative.    PHYSICAL EXAM: VS:  BP 120/80 (BP Location: Left Arm, Patient Position: Sitting, Cuff Size: Normal)   Pulse (!) 58   Ht 5' 2.75" (1.594 m)   Wt 83.8 kg (184 lb 12.8 oz)   BMI 33.00 kg/m  , BMI Body mass index is 33 kg/m. GEN: Well nourished, well developed, in no acute distress  HEENT: normal  Neck: no JVD, no masses. No carotid bruits Cardiac: RRR without murmur or gallop  Respiratory:  clear to auscultation bilaterally, normal work of breathing GI: soft, nontender, nondistended, + BS MS: no deformity or atrophy  Ext: no pretibial edema, pedal pulses 2+= bilaterally Skin: warm and dry, no rash Neuro:  Strength and sensation are intact Psych: euthymic mood, full affect  EKG:  EKG is not ordered today.  Recent Labs: 07/27/2015: TSH 3.32 10/01/2015: Magnesium 1.9 10/08/2015: Hemoglobin 10.9; Platelets 202 10/19/2015: ALT 17 11/29/2015: BUN 29; Creat 1.01; Potassium 4.7; Sodium 141   Lipid Panel     Component Value Date/Time   CHOL 124 10/01/2015 1530   TRIG 157 (H) 10/01/2015 1530   TRIG 122 12/11/2005 1028   HDL 29 (L) 10/01/2015 1530   CHOLHDL 4.3 10/01/2015 1530   VLDL 31 10/01/2015 1530   LDLCALC 64 10/01/2015 1530   LDLDIRECT 86.0 09/11/2015 1104      Wt Readings from Last 3 Encounters:  12/10/15 83.8 kg (184 lb 12.8 oz)  11/29/15 84.7 kg (186 lb 11.7 oz)  11/14/15 84.6 kg (186 lb 8 oz)     Cardiac Studies Reviewed: LHC 10/03/15 LAD prox and mid stent 85% ISR, dist stent ok with 25% ISR LCx prox 40%, dist 60% RCA ostial stent ok, prox stent ok, mid 65% and 20% PCI Angiosculpt scoring balloon and 3 x 32 mm Synergy DES to prox and mid LAD  Echo 10/02/15 EF 60-65%, normal wall motion, mild AI, mild RAE, mild to moderate TR  LHC 10/01/15 RCA ostial 80% and proximal 100%, mid 65% LCx prox 40% LAD prox stent 40% ISR, 85% ISR, dist stent ok with 25% ISR EF 55-65% PCI: 2.75 x 32 mm Synergy DES to ost/prox RCA  ASSESSMENT AND  PLAN: 1.  CAD, native vessel, with old MI. The patient is undergone multivessel stenting with primary PCI to right coronary artery in staged intervention the LAD. Noted have moderate residual stenosis in the right coronary artery. She is doing quite well with no symptoms of exertional angina. Her current medical program is reviewed and will be continued without change.  2. Hyperlipidemia: The patient is treated with atorvastatin 80 mg. Will update lipids and LFTs when she returns in 3 months.  3. Essential hypertension: Blood pressure is well controlled with valsartan.  4. Type 2 diabetes: Most recent hemoglobin A1c is 5.6. Advised her to go back to a carbohydrate restricted diet. I think she will do better long-term with this.  Current medicines are reviewed with the patient today.  The patient does not have concerns regarding medicines.  Labs/ tests ordered today include:   Orders Placed This Encounter  Procedures  . Lipid panel  . Hepatic function panel    Disposition:   FU 3 months with [facility]. Lipids at that time.   Deatra James, MD  12/10/2015 6:24 PM    [address], [place], [place]  [id number] Phone: [phone]; Fax: [phone]

## 2015-12-12 ENCOUNTER — Encounter (HOSPITAL_COMMUNITY): Payer: PPO

## 2015-12-12 ENCOUNTER — Encounter (HOSPITAL_COMMUNITY)
Admission: RE | Admit: 2015-12-12 | Discharge: 2015-12-12 | Disposition: A | Discharge status: Home / Self Care | Payer: PPO | Source: Ambulatory Visit | Attending: Cardiovascular Disease | Admitting: Cardiovascular Disease

## 2015-12-12 ENCOUNTER — Telehealth: Payer: Self-pay | Admitting: Family Medicine

## 2015-12-12 DIAGNOSIS — Z955 Presence of coronary angioplasty implant and graft: Secondary | ICD-10-CM

## 2015-12-12 DIAGNOSIS — I2119 ST elevation (STEMI) myocardial infarction involving other coronary artery of inferior wall: Secondary | ICD-10-CM | POA: Diagnosis not present

## 2015-12-12 DIAGNOSIS — I2111 ST elevation (STEMI) myocardial infarction involving right coronary artery: Secondary | ICD-10-CM

## 2015-12-12 NOTE — Telephone Encounter (Signed)
Pt states her glucose blood ([facility] ADVANCED GLUCOSE TEST) test strip   are not at the pharmacy. Looks like it was sent 10/30, but they do not have. Can you resend?  [facility] [address]

## 2015-12-13 ENCOUNTER — Other Ambulatory Visit: Payer: Self-pay

## 2015-12-13 MED ORDER — GLUCOSE BLOOD VI STRP: ORAL_STRIP | 2 refills | Status: DC

## 2015-12-13 NOTE — Progress Notes (Signed)
Cardiac Individual Treatment Plan  Patient Details  Name: Tricia [name] MRN: [id number] Date of Birth: 1945-02-11 Referring Provider:   Flowsheet Row CARDIAC REHAB PHASE II ORIENTATION from 11/29/2015 in [facility]  Referring Provider  Sherren Mocha MD      Initial Encounter Date:  [place] PHASE II ORIENTATION from 11/29/2015 in [facility]  Date  11/29/15  Referring Provider  Sherren Mocha MD      Visit Diagnosis: 10/01/15 ST elevation myocardial infarction involving right coronary artery ([place])  10/03/15 Status post coronary artery stent placement LAD  Patient's Home Medications on Admission:  Current Outpatient Prescriptions:  .  aspirin EC 81 MG tablet, Take 81 mg by mouth daily., Disp: , Rfl:  .  atorvastatin (LIPITOR) 80 MG tablet, Take 1 tablet (80 mg total) by mouth daily., Disp: 30 tablet, Rfl: 6 .  Biotin 10000 MCG TABS, Take 10,000 mcg by mouth daily. Dissolvable tablets, Disp: , Rfl:  .  carvedilol (COREG) 6.25 MG tablet, Take 1 tablet (6.25 mg total) by mouth 2 (two) times daily with a meal., Disp: 180 tablet, Rfl: 3 .  Cholecalciferol (VITAMIN D3) 2000 units CHEW, Chew 2,000 Units by mouth daily., Disp: , Rfl:  .  clopidogrel (PLAVIX) 75 MG tablet, Take 1 tablet (75 mg total) by mouth daily., Disp: 30 tablet, Rfl: 6 .  diazepam (VALIUM) 2 MG tablet, Take 1 tablet (2 mg total) by mouth every 6 (six) hours as needed for anxiety., Disp: 30 tablet, Rfl: 0 .  glucose blood ([employer] ADVANCED GLUCOSE TEST) test strip, USE TO TEST 1 TIME DAILY AS DIRECTED E11.22, N18.1, Disp: 100 each, Rfl: 2 .  HYDROcodone-acetaminophen (NORCO/VICODIN) 5-325 MG tablet, Take 1 tablet by mouth every 6 (six) hours as needed for moderate pain., Disp: , Rfl:  .  Multiple Vitamin (MULTIVITAMIN WITH MINERALS) TABS tablet, Take 1 tablet by mouth daily. Women's One a Day, Disp: , Rfl:  .  nitroGLYCERIN (NITROLINGUAL) 0.4  MG/SPRAY spray, Place 1 spray under the tongue every 5 (five) minutes as needed for chest pain., Disp: 4.9 g, Rfl: 3 .  pantoprazole (PROTONIX) 40 MG tablet, Take 1 tablet (40 mg total) by mouth daily., Disp: 30 tablet, Rfl: 6 .  tiZANidine (ZANAFLEX) 2 MG tablet, Take by mouth every 8 (eight) hours as needed for muscle spasms., Disp: , Rfl:  .  valsartan (DIOVAN) 320 MG tablet, Take 1 tablet (320 mg total) by mouth daily., Disp: , Rfl:  .  VENTOLIN HFA 108 (90 BASE) MCG/ACT inhaler, INHALE TWO PUFFS BY MOUTH EVERY SIX HOURS as needed for wheezing, Disp: 18 g, Rfl: 0  Past Medical History: Past Medical History:  Diagnosis Date  . Adenomatous polyp of colon 02/2004  . Anemia   . Anxiety   . Arthritis   . B12 deficiency   . CAD (coronary artery disease)    a. s/p remote BMS to LAD;  b. 09/2015 Inf STEMI/VF Arrest: LM nl, LAD 85p (staged PCI 2 days later w/ 3.0x32 Synergy DES), 40p/m, 25d, LCX 46m, RCA 80ost/100p (2.75x32 Synergy DES), 48m, EF 55-65%.  . Depression   . DEPRESSION 09/22/2006   Qualifier: Diagnosis of  By: Jimmye Norman, LPN, Winfield Cunas   . Dermatophytosis of groin and perianal area   . Diet Controlled Diabetes Mellitus   . Diverticulosis   . H/O echocardiogram    a. 09/2015 Echo: EF 60-65%, no rwma, mild AI, mildly dil RA, mild to  mod TR.  Marland Kitchen Hyperlipidemia   . Hypertensive heart disease   . Low back pain    l5 disc  . Morbid obesity ([place])   . Obesity   . Osteoporosis   . Sleep apnea   . Sleep apnea 10/03/2009   Resolved after gastric bypass    . TOBACCO ABUSE 10/05/2009   Qualifier: Diagnosis of  By: Stanford Breed, MD, Kandyce Rud   . Ventricular fibrillation ([place]) 10/01/2015   a. In setting of inferior STEMI.    Tobacco Use: History  Smoking Status  . Former Smoker  . Packs/day: 1.50  . Years: 35.00  . Types: Cigarettes  . Quit date: 03/06/2008  Smokeless Tobacco  . Never Used    Labs: Recent Review Flowsheet Data    Labs for ITP Cardiac and Pulmonary Rehab  Latest Ref Rng & Units 09/20/2012 08/19/2013 06/08/2015 09/11/2015 10/01/2015   Cholestrol 0 - 200 mg/dL - 223(H) 162 - 124   LDLCALC 0 - 99 mg/dL - 142(H) 87 - 64   LDLDIRECT mg/dL 160.7 - - 86.0 -   HDL >40 mg/dL - 37.10(L) 37.80(L) - 29(L)   Trlycerides <150 mg/dL - 220.0(H) 186.0(H) - 157(H)   Hemoglobin A1c 4.6 - 6.5 % 6.0 6.1 6.6(H) 5.6 -   TCO2 0 - 100 mmol/L - - - - 19      Capillary Blood Glucose: Lab Results  Component Value Date   GLUCAP 75 12/05/2015   GLUCAP 117 (H) 12/05/2015   GLUCAP 102 (H) 12/03/2015   GLUCAP 80 12/03/2015   GLUCAP 106 (H) 10/11/2015     Exercise Target Goals:    Exercise Program Goal: Individual exercise prescription set with THRR, safety & activity barriers. Participant demonstrates ability to understand and report RPE using BORG scale, to self-measure pulse accurately, and to acknowledge the importance of the exercise prescription.  Exercise Prescription Goal: Starting with aerobic activity 30 plus minutes a day, 3 days per week for initial exercise prescription. Provide home exercise prescription and guidelines that participant acknowledges understanding prior to discharge.  Activity Barriers & Risk Stratification:     Activity Barriers & Cardiac Risk Stratification - 11/29/15 1018      Activity Barriers & Cardiac Risk Stratification   Cardiac Risk Stratification High      6 Minute Walk:     6 Minute Walk    Row Name 11/29/15 1247         6 Minute Walk   Phase Initial     Distance 1306 feet     Walk Time 6 minutes     # of Rest Breaks 0     MPH 2.5     METS 2.7     RPE 12     Perceived Dyspnea  0     VO2 Peak 9.6     Symptoms No     Resting HR 66 bpm     Resting BP 118/70     Max Ex. HR 96 bpm     Max Ex. BP 150/80     2 Minute Post BP 108/70        Initial Exercise Prescription:     Initial Exercise Prescription - 11/29/15 1200      Date of Initial Exercise RX and Referring Provider   Date 11/29/15   Referring  Provider Sherren Mocha MD     Recumbant Bike   Level 1   Minutes 10   METs 2.5     NuStep  Level 1   Minutes 10   METs 1.7     Track   Laps 10   Minutes 10   METs 2.74     Prescription Details   Frequency (times per week) 3   Duration Progress to 45 minutes of aerobic exercise without signs/symptoms of physical distress     Intensity   THRR 40-80% of Max Heartrate 61-122   Ratings of Perceived Exertion 11-13   Perceived Dyspnea 0-4     Progression   Progression Continue to progress workloads to maintain intensity without signs/symptoms of physical distress.     Resistance Training   Training Prescription Yes   Weight 1lb   Reps 10-12      Perform Capillary Blood Glucose checks as needed.  Exercise Prescription Changes:     Exercise Prescription Changes    Row Name 12/11/15 1000             Exercise Review   Progression Yes         Response to Exercise   Blood Pressure (Admit) 138/80       Blood Pressure (Exercise) 118/60       Blood Pressure (Exit) 110/80       Heart Rate (Admit) 81 bpm       Heart Rate (Exercise) 124 bpm       Heart Rate (Exit) 80 bpm       Rating of Perceived Exertion (Exercise) 11       Symptoms none       Duration Progress to 30 minutes of continuous aerobic without signs/symptoms of physical distress       Intensity THRR unchanged         Progression   Average METs 2.4         Resistance Training   Training Prescription Yes       Weight 2lbs       Reps 10-12         Recumbant Bike   Level 1       Minutes 10       METs 1.9         NuStep   Level 3       Minutes 10       METs 2.6         Track   Laps 10       Minutes 10       METs 2.74          Exercise Comments:     Exercise Comments    Row Name 12/11/15 1020           Exercise Comments Pt is tolerating exercise well; will continue to monitor exercise progression.          Discharge Exercise Prescription (Final Exercise Prescription  Changes):     Exercise Prescription Changes - 12/11/15 1000      Exercise Review   Progression Yes     Response to Exercise   Blood Pressure (Admit) 138/80   Blood Pressure (Exercise) 118/60   Blood Pressure (Exit) 110/80   Heart Rate (Admit) 81 bpm   Heart Rate (Exercise) 124 bpm   Heart Rate (Exit) 80 bpm   Rating of Perceived Exertion (Exercise) 11   Symptoms none   Duration Progress to 30 minutes of continuous aerobic without signs/symptoms of physical distress   Intensity THRR unchanged     Progression   Average METs 2.4     Resistance Training   Training  Prescription Yes   Weight 2lbs   Reps 10-12     Recumbant Bike   Level 1   Minutes 10   METs 1.9     NuStep   Level 3   Minutes 10   METs 2.6     Track   Laps 10   Minutes 10   METs 2.74      Nutrition:  Target Goals: Understanding of nutrition guidelines, daily intake of sodium 1500mg , cholesterol 200mg , calories 30% from fat and 7% or less from saturated fats, daily to have 5 or more servings of fruits and vegetables.  Biometrics:     Pre Biometrics - 11/29/15 1224      Pre Biometrics   Flexibility 7.5 in   Single Leg Stand 3.68 seconds       Nutrition Therapy Plan and Nutrition Goals:     Nutrition Therapy & Goals - 11/29/15 1416      Nutrition Therapy   Diet Carb Modified, Therapeutic Lifestyle Changes     Personal Nutrition Goals   Personal Goal #1 1-2 lb wt loss/week to a wt loss goal of 6-24 lb at graduation from [facility], educate and counsel regarding individualized specific dietary modifications aiming towards targeted core components such as weight, hypertension, lipid management, diabetes, heart failure and other comorbidities.   Expected Outcomes Short Term Goal: Understand basic principles of dietary content, such as calories, fat, sodium, cholesterol and nutrients.;Long Term Goal: Adherence to prescribed nutrition plan.       Nutrition Discharge: Nutrition Scores:     Nutrition Assessments - 11/29/15 1415      MEDFICTS Scores   Pre Score 12      Nutrition Goals Re-Evaluation:   Psychosocial: Target Goals: Acknowledge presence or absence of depression, maximize coping skills, provide positive support system. Participant is able to verbalize types and ability to use techniques and skills needed for reducing stress and depression.  Initial Review & Psychosocial Screening:     Initial Psych Review & Screening - 11/29/15 1515      Initial Review   Current issues with Current Anxiety/Panic     Family Dynamics   Good Support System? Yes   Comments Brief initial review, pt with some anxiety related to participating in cardiac rehab. Offered support and encouragement.     Barriers   Psychosocial barriers to participate in program The patient should benefit from training in stress management and relaxation.     Screening Interventions   Interventions Encouraged to exercise      Quality of Life Scores:     Quality of Life - 11/29/15 1224      Quality of Life Scores   Health/Function Pre 23.92 %   Socioeconomic Pre 21 %   Psych/Spiritual Pre 24.2 %   Family Pre 28.5 %   GLOBAL Pre 23.92 %      PHQ-9: Recent Review Flowsheet Data    Depression screen [employer] 2/9 12/03/2015 11/29/2015 10/15/2015 06/22/2015 06/08/2015   Decreased Interest 0 0 1 0 0   Down, Depressed, Hopeless 0 0 1 0 0   PHQ - 2 Score 0 0 2 0 0   Altered sleeping - - 0 - -   Tired, decreased energy - - 0 - -   Change in appetite - - 3 - -   Feeling bad or failure about yourself  - - 0 - -   Trouble concentrating - - 1 - -  Moving slowly or fidgety/restless - - 0 - -   Suicidal thoughts - - 0 - -   PHQ-9 Score - - 6 - -   Difficult doing work/chores - - Not difficult at all - -      Psychosocial Evaluation and Intervention:     Psychosocial Evaluation - 12/13/15 1358      Psychosocial Evaluation & Interventions    Interventions Encouraged to exercise with the program and follow exercise prescription;Stress management education;Relaxation education   Comments No psychosocial needs identified, no further interventions needed     Discharge Psychosocial Assessment & Intervention   Discharge Continue support measures as needed      Psychosocial Re-Evaluation:     Psychosocial Re-Evaluation    [place] Name 12/13/15 1359             Psychosocial Re-Evaluation   Interventions Encouraged to attend Cardiac Rehabilitation for the exercise;Relaxation education;Stress management education          Vocational Rehabilitation: Provide vocational rehab assistance to qualifying candidates.   Vocational Rehab Evaluation & Intervention:     Vocational Rehab - 11/29/15 1546      Initial Vocational Rehab Evaluation & Intervention   Assessment shows need for Vocational Rehabilitation No  Pt does not plan to return to work. Pt is retired.       Education: Education Goals: Education classes will be provided on a weekly basis, covering required topics. Participant will state understanding/return demonstration of topics presented.  Learning Barriers/Preferences:     Learning Barriers/Preferences - 11/29/15 [id number]      Learning Barriers/Preferences   Learning Barriers Sight   Learning Preferences Group Instruction;Individual Instruction;Skilled Demonstration;Verbal Instruction;Audio      Education Topics: Count Your Pulse:  -Group instruction provided by verbal instruction, demonstration, patient participation and written materials to support subject.  Instructors address importance of being able to find your pulse and how to count your pulse when at home without a heart monitor.  Patients get hands on experience counting their pulse with staff help and individually. Flowsheet Row CARDIAC REHAB PHASE II EXERCISE from 12/12/2015 in [facility]  Date  12/07/15  Educator   Andi Hence, Rn  Instruction Review Code  2- meets goals/outcomes      Heart Attack, Angina, and Risk Factor Modification:  -Group instruction provided by verbal instruction, video, and written materials to support subject.  Instructors address signs and symptoms of angina and heart attacks.    Also discuss risk factors for heart disease and how to make changes to improve heart health risk factors.   Functional Fitness:  -Group instruction provided by verbal instruction, demonstration, patient participation, and written materials to support subject.  Instructors address safety measures for doing things around the house.  Discuss how to get up and down off the floor, how to pick things up properly, how to safely get out of a chair without assistance, and balance training.   Meditation and Mindfulness:  -Group instruction provided by verbal instruction, patient participation, and written materials to support subject.  Instructor addresses importance of mindfulness and meditation practice to help reduce stress and improve awareness.  Instructor also leads participants through a meditation exercise.    Stretching for Flexibility and Mobility:  -Group instruction provided by verbal instruction, patient participation, and written materials to support subject.  Instructors lead participants through series of stretches that are designed to increase flexibility thus improving mobility.  These stretches are additional exercise for major muscle  groups that are typically performed during regular warm up and cool down.   Hands Only CPR Anytime:  -Group instruction provided by verbal instruction, video, patient participation and written materials to support subject.  Instructors co-teach with AHA video for hands only CPR.  Participants get hands on experience with mannequins.   Nutrition I class: Heart Healthy Eating:  -Group instruction provided by PowerPoint slides, verbal discussion, and written  materials to support subject matter. The instructor gives an explanation and review of the Therapeutic Lifestyle Changes diet recommendations, which includes a discussion on lipid goals, dietary fat, sodium, fiber, plant stanol/sterol esters, sugar, and the components of a well-balanced, healthy diet. Flowsheet Row CARDIAC REHAB PHASE II EXERCISE from 12/12/2015 in [facility]  Date  12/11/15  Educator  RD  Instruction Review Code  2- meets goals/outcomes      Nutrition II class: Lifestyle Skills:  -Group instruction provided by PowerPoint slides, verbal discussion, and written materials to support subject matter. The instructor gives an explanation and review of label reading, grocery shopping for heart health, heart healthy recipe modifications, and ways to make healthier choices when eating out.   Diabetes Question & Answer:  -Group instruction provided by PowerPoint slides, verbal discussion, and written materials to support subject matter. The instructor gives an explanation and review of diabetes co-morbidities, pre- and post-prandial blood glucose goals, pre-exercise blood glucose goals, signs, symptoms, and treatment of hypoglycemia and hyperglycemia, and foot care basics.   Diabetes Blitz:  -Group instruction provided by PowerPoint slides, verbal discussion, and written materials to support subject matter. The instructor gives an explanation and review of the physiology behind type 1 and type 2 diabetes, diabetes medications and rational behind using different medications, pre- and post-prandial blood glucose recommendations and Hemoglobin A1c goals, diabetes diet, and exercise including blood glucose guidelines for exercising safely.    Portion Distortion:  -Group instruction provided by PowerPoint slides, verbal discussion, written materials, and food models to support subject matter. The instructor gives an explanation of serving size versus portion  size, changes in portions sizes over the last 20 years, and what consists of a serving from each food group.   Stress Management:  -Group instruction provided by verbal instruction, video, and written materials to support subject matter.  Instructors review role of stress in heart disease and how to cope with stress positively.     Exercising on Your Own:  -Group instruction provided by verbal instruction, power point, and written materials to support subject.  Instructors discuss benefits of exercise, components of exercise, frequency and intensity of exercise, and end points for exercise.  Also discuss use of nitroglycerin and activating EMS.  Review options of places to exercise outside of rehab.  Review guidelines for sex with heart disease.   Cardiac Drugs I:  -Group instruction provided by verbal instruction and written materials to support subject.  Instructor reviews cardiac drug classes: antiplatelets, anticoagulants, beta blockers, and statins.  Instructor discusses reasons, side effects, and lifestyle considerations for each drug class.   Cardiac Drugs II:  -Group instruction provided by verbal instruction and written materials to support subject.  Instructor reviews cardiac drug classes: angiotensin converting enzyme inhibitors (ACE-I), angiotensin II receptor blockers (ARBs), nitrates, and calcium channel blockers.  Instructor discusses reasons, side effects, and lifestyle considerations for each drug class. Flowsheet Row CARDIAC REHAB PHASE II EXERCISE from 12/12/2015 in [facility]  Date  12/12/15  Instruction Review Code  2- meets  goals/outcomes      Anatomy and Physiology of the Circulatory System:  -Group instruction provided by verbal instruction, video, and written materials to support subject.  Reviews functional anatomy of heart, how it relates to various diagnoses, and what role the heart plays in the overall system. Flowsheet Row CARDIAC  REHAB PHASE II EXERCISE from 12/12/2015 in [facility]  Date  12/05/15  Instruction Review Code  2- meets goals/outcomes      Knowledge Questionnaire Score:     Knowledge Questionnaire Score - 11/29/15 1217      Knowledge Questionnaire Score   Pre Score 19/24      Core Components/Risk Factors/Patient Goals at Admission:     Personal Goals and Risk Factors at Admission - 11/29/15 1019      Core Components/Risk Factors/Patient Goals on Admission   Expected Outcomes weight loss and increase overall functional capacity       Core Components/Risk Factors/Patient Goals Review:      Goals and Risk Factor Review    Row Name 11/29/15 0830 11/29/15 0843           Core Components/Risk Factors/Patient Goals Review   Personal Goals Review Weight Management/Obesity;Other  -      Review Lose weigt, 20 lbs in three months and 60lbs long term.  Be able to walk longer distance w/o SOB Lose weigt, 20 lbs in three months and 60lbs long term.  Be able to walk longer distance w/o SOB, increase leg strength and balance      Expected Outcomes Weight loss, increase stamina and overall functional capacity  Weight loss, increase stamina, balance, leg strength and overall functional capacity          Core Components/Risk Factors/Patient Goals at Discharge (Final Review):      Goals and Risk Factor Review - 11/29/15 0843      Core Components/Risk Factors/Patient Goals Review   Review Lose weigt, 20 lbs in three months and 60lbs long term.  Be able to walk longer distance w/o SOB, increase leg strength and balance   Expected Outcomes Weight loss, increase stamina, balance, leg strength and overall functional capacity       ITP Comments:     ITP Comments    Row Name 11/29/15 0836 11/29/15 1501         ITP Comments Dr. Alfredia Ferguson -  Dr. Fransico Him, Medical Director for Cardiac Rehab         Comments:  Pt is making expected progress toward personal goals  after completing 6 sessions.Pt had recent office visit for follow up with dr. Burt Knack which went well per pt.  Psychosocial assessment - No needs identified, no further interventions warranted. Pt demonstrate positive and healthy outlook on life.  Recommend continued exercise and life style modification education including  stress management and relaxation techniques to decrease cardiac risk profile.  Cherre Huger, BSN

## 2015-12-13 NOTE — Telephone Encounter (Signed)
Resent to pharmacy as requested

## 2015-12-14 ENCOUNTER — Encounter (HOSPITAL_COMMUNITY)
Admission: RE | Admit: 2015-12-14 | Discharge: 2015-12-14 | Disposition: A | Discharge status: Home / Self Care | Payer: PPO | Source: Ambulatory Visit | Attending: Cardiovascular Disease | Admitting: Cardiovascular Disease

## 2015-12-14 ENCOUNTER — Encounter (HOSPITAL_COMMUNITY): Payer: PPO

## 2015-12-14 DIAGNOSIS — I2111 ST elevation (STEMI) myocardial infarction involving right coronary artery: Secondary | ICD-10-CM

## 2015-12-14 DIAGNOSIS — I2119 ST elevation (STEMI) myocardial infarction involving other coronary artery of inferior wall: Secondary | ICD-10-CM | POA: Diagnosis not present

## 2015-12-14 DIAGNOSIS — Z955 Presence of coronary angioplasty implant and graft: Secondary | ICD-10-CM

## 2015-12-17 ENCOUNTER — Encounter (HOSPITAL_COMMUNITY): Payer: PPO

## 2015-12-17 ENCOUNTER — Encounter (HOSPITAL_COMMUNITY)
Admission: RE | Admit: 2015-12-17 | Discharge: 2015-12-17 | Disposition: A | Discharge status: Home / Self Care | Payer: PPO | Source: Ambulatory Visit | Attending: Cardiovascular Disease | Admitting: Cardiovascular Disease

## 2015-12-17 DIAGNOSIS — I2119 ST elevation (STEMI) myocardial infarction involving other coronary artery of inferior wall: Secondary | ICD-10-CM | POA: Diagnosis not present

## 2015-12-17 DIAGNOSIS — Z955 Presence of coronary angioplasty implant and graft: Secondary | ICD-10-CM

## 2015-12-17 DIAGNOSIS — I2111 ST elevation (STEMI) myocardial infarction involving right coronary artery: Secondary | ICD-10-CM

## 2015-12-19 ENCOUNTER — Other Ambulatory Visit: Payer: Self-pay

## 2015-12-19 ENCOUNTER — Encounter (HOSPITAL_COMMUNITY): Payer: PPO

## 2015-12-19 ENCOUNTER — Encounter (HOSPITAL_COMMUNITY)
Admission: RE | Admit: 2015-12-19 | Discharge: 2015-12-19 | Disposition: A | Discharge status: Home / Self Care | Payer: PPO | Source: Ambulatory Visit | Attending: Cardiovascular Disease | Admitting: Cardiovascular Disease

## 2015-12-19 DIAGNOSIS — I2111 ST elevation (STEMI) myocardial infarction involving right coronary artery: Secondary | ICD-10-CM

## 2015-12-19 DIAGNOSIS — Z955 Presence of coronary angioplasty implant and graft: Secondary | ICD-10-CM

## 2015-12-19 DIAGNOSIS — I2119 ST elevation (STEMI) myocardial infarction involving other coronary artery of inferior wall: Secondary | ICD-10-CM | POA: Diagnosis not present

## 2015-12-19 MED ORDER — GLUCOSE BLOOD VI STRP: ORAL_STRIP | 12 refills | Status: DC

## 2015-12-21 ENCOUNTER — Encounter (HOSPITAL_COMMUNITY)
Admission: RE | Admit: 2015-12-21 | Discharge: 2015-12-21 | Disposition: A | Discharge status: Home / Self Care | Payer: PPO | Source: Ambulatory Visit | Attending: Cardiovascular Disease | Admitting: Cardiovascular Disease

## 2015-12-21 ENCOUNTER — Encounter (HOSPITAL_COMMUNITY): Payer: PPO

## 2015-12-21 DIAGNOSIS — I2111 ST elevation (STEMI) myocardial infarction involving right coronary artery: Secondary | ICD-10-CM

## 2015-12-21 DIAGNOSIS — I2119 ST elevation (STEMI) myocardial infarction involving other coronary artery of inferior wall: Secondary | ICD-10-CM | POA: Diagnosis not present

## 2015-12-21 DIAGNOSIS — Z955 Presence of coronary angioplasty implant and graft: Secondary | ICD-10-CM

## 2015-12-24 ENCOUNTER — Encounter (HOSPITAL_COMMUNITY)
Admission: RE | Admit: 2015-12-24 | Discharge: 2015-12-24 | Disposition: A | Discharge status: Home / Self Care | Payer: PPO | Source: Ambulatory Visit | Attending: Cardiovascular Disease | Admitting: Cardiovascular Disease

## 2015-12-24 ENCOUNTER — Encounter (HOSPITAL_COMMUNITY): Payer: PPO

## 2015-12-24 DIAGNOSIS — Z955 Presence of coronary angioplasty implant and graft: Secondary | ICD-10-CM

## 2015-12-24 DIAGNOSIS — I2119 ST elevation (STEMI) myocardial infarction involving other coronary artery of inferior wall: Secondary | ICD-10-CM | POA: Diagnosis not present

## 2015-12-24 DIAGNOSIS — I2111 ST elevation (STEMI) myocardial infarction involving right coronary artery: Secondary | ICD-10-CM

## 2015-12-24 LAB — GLUCOSE, CAPILLARY: Glucose-Capillary: 89 mg/dL (ref 65–99)

## 2015-12-26 ENCOUNTER — Encounter (HOSPITAL_COMMUNITY): Payer: PPO

## 2015-12-26 ENCOUNTER — Encounter (HOSPITAL_COMMUNITY)
Admission: RE | Admit: 2015-12-26 | Discharge: 2015-12-26 | Disposition: A | Discharge status: Home / Self Care | Payer: PPO | Source: Ambulatory Visit | Attending: Cardiovascular Disease | Admitting: Cardiovascular Disease

## 2015-12-26 DIAGNOSIS — Z955 Presence of coronary angioplasty implant and graft: Secondary | ICD-10-CM

## 2015-12-26 DIAGNOSIS — I2119 ST elevation (STEMI) myocardial infarction involving other coronary artery of inferior wall: Secondary | ICD-10-CM | POA: Diagnosis not present

## 2015-12-26 DIAGNOSIS — I2111 ST elevation (STEMI) myocardial infarction involving right coronary artery: Secondary | ICD-10-CM

## 2015-12-28 ENCOUNTER — Encounter (HOSPITAL_COMMUNITY): Payer: PPO

## 2015-12-31 ENCOUNTER — Encounter (HOSPITAL_COMMUNITY)
Admission: RE | Admit: 2015-12-31 | Discharge: 2015-12-31 | Disposition: A | Discharge status: Home / Self Care | Payer: PPO | Source: Ambulatory Visit | Attending: Cardiovascular Disease | Admitting: Cardiovascular Disease

## 2015-12-31 ENCOUNTER — Encounter (HOSPITAL_COMMUNITY): Payer: PPO

## 2015-12-31 DIAGNOSIS — I2119 ST elevation (STEMI) myocardial infarction involving other coronary artery of inferior wall: Secondary | ICD-10-CM | POA: Diagnosis not present

## 2015-12-31 DIAGNOSIS — Z955 Presence of coronary angioplasty implant and graft: Secondary | ICD-10-CM

## 2015-12-31 DIAGNOSIS — I2111 ST elevation (STEMI) myocardial infarction involving right coronary artery: Secondary | ICD-10-CM

## 2016-01-02 ENCOUNTER — Encounter (HOSPITAL_COMMUNITY)
Admission: RE | Admit: 2016-01-02 | Discharge: 2016-01-02 | Disposition: A | Discharge status: Home / Self Care | Payer: PPO | Source: Ambulatory Visit | Attending: Cardiovascular Disease | Admitting: Cardiovascular Disease

## 2016-01-02 ENCOUNTER — Encounter (HOSPITAL_COMMUNITY): Payer: PPO

## 2016-01-02 DIAGNOSIS — I2111 ST elevation (STEMI) myocardial infarction involving right coronary artery: Secondary | ICD-10-CM

## 2016-01-02 DIAGNOSIS — I2119 ST elevation (STEMI) myocardial infarction involving other coronary artery of inferior wall: Secondary | ICD-10-CM | POA: Diagnosis not present

## 2016-01-02 DIAGNOSIS — Z955 Presence of coronary angioplasty implant and graft: Secondary | ICD-10-CM

## 2016-01-04 ENCOUNTER — Encounter (HOSPITAL_COMMUNITY)
Admission: RE | Admit: 2016-01-04 | Discharge: 2016-01-04 | Disposition: A | Discharge status: Home / Self Care | Payer: PPO | Source: Ambulatory Visit | Attending: Cardiovascular Disease | Admitting: Cardiovascular Disease

## 2016-01-04 ENCOUNTER — Encounter (HOSPITAL_COMMUNITY): Payer: PPO

## 2016-01-04 DIAGNOSIS — Z9861 Coronary angioplasty status: Secondary | ICD-10-CM | POA: Insufficient documentation

## 2016-01-04 DIAGNOSIS — E785 Hyperlipidemia, unspecified: Secondary | ICD-10-CM | POA: Insufficient documentation

## 2016-01-04 DIAGNOSIS — Z955 Presence of coronary angioplasty implant and graft: Secondary | ICD-10-CM | POA: Diagnosis not present

## 2016-01-04 DIAGNOSIS — I2119 ST elevation (STEMI) myocardial infarction involving other coronary artery of inferior wall: Secondary | ICD-10-CM | POA: Insufficient documentation

## 2016-01-04 DIAGNOSIS — I213 ST elevation (STEMI) myocardial infarction of unspecified site: Secondary | ICD-10-CM | POA: Diagnosis not present

## 2016-01-04 DIAGNOSIS — I10 Essential (primary) hypertension: Secondary | ICD-10-CM | POA: Insufficient documentation

## 2016-01-04 DIAGNOSIS — I2510 Atherosclerotic heart disease of native coronary artery without angina pectoris: Secondary | ICD-10-CM | POA: Diagnosis not present

## 2016-01-04 DIAGNOSIS — I2111 ST elevation (STEMI) myocardial infarction involving right coronary artery: Secondary | ICD-10-CM

## 2016-01-04 NOTE — Progress Notes (Signed)
Tricia [name] 70 y.o. female Nutrition Note Spoke with pt. Nutrition Plan and Nutrition Survey goals reviewed with pt. Pt is following Step 2 of the Therapeutic Lifestyle Changes diet. Pt wants to lose wt. Pt has been trying to lose wt by decreasing calorie intake to 1200 kcal/d. Pt reports she is slowly losing wt "because I'm eating carbs." Pt previously followed a very high protein diet (60% protein). Pt is a diet-controlled diabetic. Last A1c indicates blood glucose well-controlled. Pt expressed understanding of the information reviewed. Pt aware of nutrition education classes offered.  Lab Results  Component Value Date   HGBA1C 5.6 09/11/2015   Wt Readings from Last 3 Encounters:  12/10/15 184 lb 12.8 oz (83.8 kg)  11/29/15 186 lb 11.7 oz (84.7 kg)  11/14/15 186 lb 8 oz (84.6 kg)    Nutrition Diagnosis ? Food-and nutrition-related knowledge deficit related to lack of exposure to information as related to diagnosis of: ? CVD ? DM ? Obesity related to excessive energy intake as evidenced by a BMI of 33.4  Nutrition Intervention ? Pt's individual nutrition plan reviewed with pt. ? Benefits of adopting Therapeutic Lifestyle Changes discussed when Medficts reviewed. ? Pt to attend the Portion Distortion class - met ? Pt to attend the Diabetes Q & A class ? Pt to attend the   ? Nutrition I class - met                 ? Nutrition II class - met     ? Diabetes Blitz class ? Continue client-centered nutrition education by RD, as part of interdisciplinary care. Goal(s) ? Pt to identify food quantities necessary to achieve weight loss of 6-24 lb (2.7-10.9 kg) at graduation from cardiac rehab.  Monitor and Evaluate progress toward nutrition goal with team. Derek Mound, M.Ed, RD, LDN, CDE 01/04/2016 1:44 PM

## 2016-01-04 NOTE — Progress Notes (Signed)
Reviewed home exercise with pt today.  Pt plans to swim/water aerobics at [facility] (starting in Jan.)for exercise. In the meantime pt will continue to come to cardiac rehab 3x/week and walk at home on non rehab days. Reviewed THR, pulse, RPE, sign and symptoms, NTG use, and when to call 911 or MD.  Also discussed weather considerations and indoor options.  Pt voiced understanding.    Laneice Meneely [employer]

## 2016-01-07 ENCOUNTER — Encounter (HOSPITAL_COMMUNITY)
Admission: RE | Admit: 2016-01-07 | Discharge: 2016-01-07 | Disposition: A | Discharge status: Home / Self Care | Payer: PPO | Source: Ambulatory Visit | Attending: Cardiovascular Disease | Admitting: Cardiovascular Disease

## 2016-01-07 ENCOUNTER — Encounter (HOSPITAL_COMMUNITY): Payer: PPO

## 2016-01-07 DIAGNOSIS — Z955 Presence of coronary angioplasty implant and graft: Secondary | ICD-10-CM

## 2016-01-07 DIAGNOSIS — I2119 ST elevation (STEMI) myocardial infarction involving other coronary artery of inferior wall: Secondary | ICD-10-CM | POA: Diagnosis not present

## 2016-01-07 DIAGNOSIS — I2111 ST elevation (STEMI) myocardial infarction involving right coronary artery: Secondary | ICD-10-CM

## 2016-01-07 LAB — GLUCOSE, CAPILLARY: Glucose-Capillary: 93 mg/dL (ref 65–99)

## 2016-01-09 ENCOUNTER — Encounter (HOSPITAL_COMMUNITY)
Admission: RE | Admit: 2016-01-09 | Discharge: 2016-01-09 | Disposition: A | Discharge status: Home / Self Care | Payer: PPO | Source: Ambulatory Visit | Attending: Cardiovascular Disease | Admitting: Cardiovascular Disease

## 2016-01-09 ENCOUNTER — Encounter (HOSPITAL_COMMUNITY): Payer: PPO

## 2016-01-09 DIAGNOSIS — I2119 ST elevation (STEMI) myocardial infarction involving other coronary artery of inferior wall: Secondary | ICD-10-CM | POA: Diagnosis not present

## 2016-01-09 DIAGNOSIS — I2111 ST elevation (STEMI) myocardial infarction involving right coronary artery: Secondary | ICD-10-CM

## 2016-01-09 DIAGNOSIS — Z955 Presence of coronary angioplasty implant and graft: Secondary | ICD-10-CM

## 2016-01-10 NOTE — Progress Notes (Signed)
Cardiac Individual Treatment Plan  Patient Details  Name: Tricia [name] MRN: [id number] Date of Birth: 11/16/1945 Referring Provider:   Flowsheet Row CARDIAC REHAB PHASE II ORIENTATION from 11/29/2015 in [place]  Referring Provider  Sherren Mocha MD      Initial Encounter Date:  [place] PHASE II ORIENTATION from 11/29/2015 in [place]  Date  11/29/15  Referring Provider  Sherren Mocha MD      Visit Diagnosis: 10/03/15 Status post coronary artery stent placement LAD  10/01/15 ST elevation myocardial infarction involving right coronary artery [facility])  Patient's Home Medications on Admission:  Current Outpatient Prescriptions:  .  aspirin EC 81 MG tablet, Take 81 mg by mouth daily., Disp: , Rfl:  .  atorvastatin (LIPITOR) 80 MG tablet, Take 1 tablet (80 mg total) by mouth daily., Disp: 30 tablet, Rfl: 6 .  Biotin 10000 MCG TABS, Take 10,000 mcg by mouth daily. Dissolvable tablets, Disp: , Rfl:  .  carvedilol (COREG) 6.25 MG tablet, Take 1 tablet (6.25 mg total) by mouth 2 (two) times daily with a meal., Disp: 180 tablet, Rfl: 3 .  Cholecalciferol (VITAMIN D3) 2000 units CHEW, Chew 2,000 Units by mouth daily., Disp: , Rfl:  .  clopidogrel (PLAVIX) 75 MG tablet, Take 1 tablet (75 mg total) by mouth daily., Disp: 30 tablet, Rfl: 6 .  diazepam (VALIUM) 2 MG tablet, Take 1 tablet (2 mg total) by mouth every 6 (six) hours as needed for anxiety., Disp: 30 tablet, Rfl: 0 .  glucose blood ([employer] ADVANCED GLUCOSE TEST) test strip, USE TO TEST 1 TIME DAILY AS DIRECTED E11.22, N18.1, Disp: 100 each, Rfl: 2 .  glucose blood test strip, Use once a day and as needed to test blood sugars, Disp: 100 each, Rfl: 12 .  HYDROcodone-acetaminophen (NORCO/VICODIN) 5-325 MG tablet, Take 1 tablet by mouth every 6 (six) hours as needed for moderate pain., Disp: , Rfl:  .  Multiple Vitamin (MULTIVITAMIN WITH MINERALS) TABS  tablet, Take 1 tablet by mouth daily. Women's One a Day, Disp: , Rfl:  .  nitroGLYCERIN (NITROLINGUAL) 0.4 MG/SPRAY spray, Place 1 spray under the tongue every 5 (five) minutes as needed for chest pain., Disp: 4.9 g, Rfl: 3 .  pantoprazole (PROTONIX) 40 MG tablet, Take 1 tablet (40 mg total) by mouth daily., Disp: 30 tablet, Rfl: 6 .  tiZANidine (ZANAFLEX) 2 MG tablet, Take by mouth every 8 (eight) hours as needed for muscle spasms., Disp: , Rfl:  .  valsartan (DIOVAN) 320 MG tablet, Take 1 tablet (320 mg total) by mouth daily., Disp: , Rfl:  .  VENTOLIN HFA 108 (90 BASE) MCG/ACT inhaler, INHALE TWO PUFFS BY MOUTH EVERY SIX HOURS as needed for wheezing, Disp: 18 g, Rfl: 0  Past Medical History: Past Medical History:  Diagnosis Date  . Adenomatous polyp of colon 02/2004  . Anemia   . Anxiety   . Arthritis   . B12 deficiency   . CAD (coronary artery disease)    a. s/p remote BMS to LAD;  b. 09/2015 Inf STEMI/VF Arrest: LM nl, LAD 85p (staged PCI 2 days later w/ 3.0x32 Synergy DES), 40p/m, 25d, LCX 26m, RCA 80ost/100p (2.75x32 Synergy DES), 52m, EF 55-65%.  . Depression   . DEPRESSION 09/22/2006   Qualifier: Diagnosis of  By: Jimmye Norman, LPN, [place] Cunas   . Dermatophytosis of groin and perianal area   . Diet Controlled Diabetes Mellitus   . Diverticulosis   .  H/O echocardiogram    a. 09/2015 Echo: EF 60-65%, no rwma, mild AI, mildly dil RA, mild to mod TR.  Marland Kitchen Hyperlipidemia   . Hypertensive heart disease   . Low back pain    l5 disc  . Morbid obesity ([place])   . Obesity   . Osteoporosis   . Sleep apnea   . Sleep apnea 10/03/2009   Resolved after gastric bypass    . TOBACCO ABUSE 10/05/2009   Qualifier: Diagnosis of  By: Stanford Breed, MD, Kandyce Rud   . Ventricular fibrillation ([place]) 10/01/2015   a. In setting of inferior STEMI.    Tobacco Use: History  Smoking Status  . Former Smoker  . Packs/day: 1.50  . Years: 35.00  . Types: Cigarettes  . Quit date: 03/06/2008  Smokeless  Tobacco  . Never Used    Labs: Recent Review Flowsheet Data    Labs for ITP Cardiac and Pulmonary Rehab Latest Ref Rng & Units 09/20/2012 08/19/2013 06/08/2015 09/11/2015 10/01/2015   Cholestrol 0 - 200 mg/dL - 223(H) 162 - 124   LDLCALC 0 - 99 mg/dL - 142(H) 87 - 64   LDLDIRECT mg/dL 160.7 - - 86.0 -   HDL >40 mg/dL - 37.10(L) 37.80(L) - 29(L)   Trlycerides <150 mg/dL - 220.0(H) 186.0(H) - 157(H)   Hemoglobin A1c 4.6 - 6.5 % 6.0 6.1 6.6(H) 5.6 -   TCO2 0 - 100 mmol/L - - - - 19      Capillary Blood Glucose: Lab Results  Component Value Date   GLUCAP 93 01/07/2016   GLUCAP 89 12/24/2015   GLUCAP 75 12/05/2015   GLUCAP 117 (H) 12/05/2015   GLUCAP 102 (H) 12/03/2015     Exercise Target Goals:    Exercise Program Goal: Individual exercise prescription set with THRR, safety & activity barriers. Participant demonstrates ability to understand and report RPE using BORG scale, to self-measure pulse accurately, and to acknowledge the importance of the exercise prescription.  Exercise Prescription Goal: Starting with aerobic activity 30 plus minutes a day, 3 days per week for initial exercise prescription. Provide home exercise prescription and guidelines that participant acknowledges understanding prior to discharge.  Activity Barriers & Risk Stratification:     Activity Barriers & Cardiac Risk Stratification - 11/29/15 1018      Activity Barriers & Cardiac Risk Stratification   Cardiac Risk Stratification High      6 Minute Walk:     6 Minute Walk    Row Name 11/29/15 1247         6 Minute Walk   Phase Initial     Distance 1306 feet     Walk Time 6 minutes     # of Rest Breaks 0     MPH 2.5     METS 2.7     RPE 12     Perceived Dyspnea  0     VO2 Peak 9.6     Symptoms No     Resting HR 66 bpm     Resting BP 118/70     Max Ex. HR 96 bpm     Max Ex. BP 150/80     2 Minute Post BP 108/70        Initial Exercise Prescription:     Initial Exercise Prescription  - 11/29/15 1200      Date of Initial Exercise RX and Referring Provider   Date 11/29/15   Referring Provider Sherren Mocha MD     Recumbant Bike  Level 1   Minutes 10   METs 2.5     NuStep   Level 1   Minutes 10   METs 1.7     Track   Laps 10   Minutes 10   METs 2.74     Prescription Details   Frequency (times per week) 3   Duration Progress to 45 minutes of aerobic exercise without signs/symptoms of physical distress     Intensity   THRR 40-80% of Max Heartrate 61-122   Ratings of Perceived Exertion 11-13   Perceived Dyspnea 0-4     Progression   Progression Continue to progress workloads to maintain intensity without signs/symptoms of physical distress.     Resistance Training   Training Prescription Yes   Weight 1lb   Reps 10-12      Perform Capillary Blood Glucose checks as needed.  Exercise Prescription Changes:     Exercise Prescription Changes    Row Name 12/11/15 1000 01/08/16 1100           Exercise Review   Progression Yes Yes        Response to Exercise   Blood Pressure (Admit) 138/80 132/70      Blood Pressure (Exercise) 118/60 122/80      Blood Pressure (Exit) 110/80 122/78      Heart Rate (Admit) 81 bpm 72 bpm      Heart Rate (Exercise) 124 bpm 109 bpm      Heart Rate (Exit) 80 bpm 67 bpm      Rating of Perceived Exertion (Exercise) 11 11      Symptoms none none      Comments  - Reviewed HEP on 01/04/16      Duration Progress to 30 minutes of continuous aerobic without signs/symptoms of physical distress Progress to 30 minutes of continuous aerobic without signs/symptoms of physical distress      Intensity THRR unchanged THRR unchanged        Progression   Average METs 2.4 3.4        Resistance Training   Training Prescription Yes Yes      Weight 2lbs 4lbs      Reps 10-12 10-12        Recumbant Bike   Level 1 2.5      Minutes 10 10      METs 1.9 4        NuStep   Level 3 4      Minutes 10 10      METs 2.6 2.8         Track   Laps 10 14      Minutes 10 10      METs 2.74 3.43        Home Exercise Plan   Plans to continue exercise at  - Home  Reviewed on 01/04/16 see progress note      Frequency  - Add 2 additional days to program exercise sessions.         Exercise Comments:     Exercise Comments    Row Name 12/11/15 1020 01/08/16 1119         Exercise Comments Pt is tolerating exercise well; will continue to monitor exercise progression. Reviewed METs and goals. Pt is tolerating exercise well; will continue to monitor exercise progression.         Discharge Exercise Prescription (Final Exercise Prescription Changes):     Exercise Prescription Changes - 01/08/16 1100      Exercise Review  Progression Yes     Response to Exercise   Blood Pressure (Admit) 132/70   Blood Pressure (Exercise) 122/80   Blood Pressure (Exit) 122/78   Heart Rate (Admit) 72 bpm   Heart Rate (Exercise) 109 bpm   Heart Rate (Exit) 67 bpm   Rating of Perceived Exertion (Exercise) 11   Symptoms none   Comments Reviewed HEP on 01/04/16   Duration Progress to 30 minutes of continuous aerobic without signs/symptoms of physical distress   Intensity THRR unchanged     Progression   Average METs 3.4     Resistance Training   Training Prescription Yes   Weight 4lbs   Reps 10-12     Recumbant Bike   Level 2.5   Minutes 10   METs 4     NuStep   Level 4   Minutes 10   METs 2.8     Track   Laps 14   Minutes 10   METs 3.43     Home Exercise Plan   Plans to continue exercise at Home  Reviewed on 01/04/16 see progress note   Frequency Add 2 additional days to program exercise sessions.      Nutrition:  Target Goals: Understanding of nutrition guidelines, daily intake of sodium 1500mg , cholesterol 200mg , calories 30% from fat and 7% or less from saturated fats, daily to have 5 or more servings of fruits and vegetables.  Biometrics:     Pre Biometrics - 11/29/15 1224      Pre Biometrics    Flexibility 7.5 in   Single Leg Stand 3.68 seconds       Nutrition Therapy Plan and Nutrition Goals:     Nutrition Therapy & Goals - 11/29/15 1416      Nutrition Therapy   Diet Carb Modified, Therapeutic Lifestyle Changes     Personal Nutrition Goals   Personal Goal #1 1-2 lb wt loss/week to a wt loss goal of 6-24 lb at graduation from [place], educate and counsel regarding individualized specific dietary modifications aiming towards targeted core components such as weight, hypertension, lipid management, diabetes, heart failure and other comorbidities.   Expected Outcomes Short Term Goal: Understand basic principles of dietary content, such as calories, fat, sodium, cholesterol and nutrients.;Long Term Goal: Adherence to prescribed nutrition plan.      Nutrition Discharge: Nutrition Scores:     Nutrition Assessments - 11/29/15 1415      MEDFICTS Scores   Pre Score 12      Nutrition Goals Re-Evaluation:   Psychosocial: Target Goals: Acknowledge presence or absence of depression, maximize coping skills, provide positive support system. Participant is able to verbalize types and ability to use techniques and skills needed for reducing stress and depression.  Initial Review & Psychosocial Screening:     Initial Psych Review & Screening - 11/29/15 1515      Initial Review   Current issues with Current Anxiety/Panic     Family Dynamics   Good Support System? Yes   Comments Brief initial review, pt with some anxiety related to participating in cardiac rehab. Offered support and encouragement.     Barriers   Psychosocial barriers to participate in program The patient should benefit from training in stress management and relaxation.     Screening Interventions   Interventions Encouraged to exercise      Quality of Life Scores:     Quality of Life - 11/29/15 1224  Quality of Life Scores   Health/Function  Pre 23.92 %   Socioeconomic Pre 21 %   Psych/Spiritual Pre 24.2 %   Family Pre 28.5 %   GLOBAL Pre 23.92 %      PHQ-9: Recent Review Flowsheet Data    Depression screen [facility] 2/9 12/03/2015 11/29/2015 10/15/2015 06/22/2015 06/08/2015   Decreased Interest 0 0 1 0 0   Down, Depressed, Hopeless 0 0 1 0 0   PHQ - 2 Score 0 0 2 0 0   Altered sleeping - - 0 - -   Tired, decreased energy - - 0 - -   Change in appetite - - 3 - -   Feeling bad or failure about yourself  - - 0 - -   Trouble concentrating - - 1 - -   Moving slowly or fidgety/restless - - 0 - -   Suicidal thoughts - - 0 - -   PHQ-9 Score - - 6 - -   Difficult doing work/chores - - Not difficult at all - -      Psychosocial Evaluation and Intervention:     Psychosocial Evaluation - 01/10/16 1421      Psychosocial Evaluation & Interventions   Interventions Encouraged to exercise with the program and follow exercise prescription;Stress management education;Relaxation education   Comments No psychosocial needs identified, no further interventions needed     Discharge Psychosocial Assessment & Intervention   Discharge Continue support measures as needed      Psychosocial Re-Evaluation:     Psychosocial Re-Evaluation    Row Name 12/13/15 1359 01/10/16 1421           Psychosocial Re-Evaluation   Interventions Encouraged to attend Cardiac Rehabilitation for the exercise;Relaxation education;Stress management education Encouraged to attend Cardiac Rehabilitation for the exercise;Stress management education;Relaxation education      Continued Psychosocial Services Needed  - No         Vocational Rehabilitation: Provide vocational rehab assistance to qualifying candidates.   Vocational Rehab Evaluation & Intervention:     Vocational Rehab - 11/29/15 1546      Initial Vocational Rehab Evaluation & Intervention   Assessment shows need for Vocational Rehabilitation No  Pt does not plan to return to work. Pt is  retired.       Education: Education Goals: Education classes will be provided on a weekly basis, covering required topics. Participant will state understanding/return demonstration of topics presented.  Learning Barriers/Preferences:     Learning Barriers/Preferences - 11/29/15 [id number]      Learning Barriers/Preferences   Learning Barriers Sight   Learning Preferences Group Instruction;Individual Instruction;Skilled Demonstration;Verbal Instruction;Audio      Education Topics: Count Your Pulse:  -Group instruction provided by verbal instruction, demonstration, patient participation and written materials to support subject.  Instructors address importance of being able to find your pulse and how to count your pulse when at home without a heart monitor.  Patients get hands on experience counting their pulse with staff help and individually. Flowsheet Row CARDIAC REHAB PHASE II EXERCISE from 01/09/2016 in [place]  Date  12/07/15  Educator  Andi Hence, Rn  Instruction Review Code  2- meets goals/outcomes      Heart Attack, Angina, and Risk Factor Modification:  -Group instruction provided by verbal instruction, video, and written materials to support subject.  Instructors address signs and symptoms of angina and heart attacks.    Also discuss risk factors for heart disease and how to make  changes to improve heart health risk factors. Flowsheet Row CARDIAC REHAB PHASE II EXERCISE from 01/09/2016 in [place]  Date  01/04/16 [hypertension]  Educator  Andi Hence, RN  Instruction Review Code  2- meets goals/outcomes      Functional Fitness:  -Group instruction provided by verbal instruction, demonstration, patient participation, and written materials to support subject.  Instructors address safety measures for doing things around the house.  Discuss how to get up and down off the floor, how to pick things up properly, how to  safely get out of a chair without assistance, and balance training. Flowsheet Row CARDIAC REHAB PHASE II EXERCISE from 01/09/2016 in [place]  Date  12/14/15  Instruction Review Code  2- meets goals/outcomes      Meditation and Mindfulness:  -Group instruction provided by verbal instruction, patient participation, and written materials to support subject.  Instructor addresses importance of mindfulness and meditation practice to help reduce stress and improve awareness.  Instructor also leads participants through a meditation exercise.    Stretching for Flexibility and Mobility:  -Group instruction provided by verbal instruction, patient participation, and written materials to support subject.  Instructors lead participants through series of stretches that are designed to increase flexibility thus improving mobility.  These stretches are additional exercise for major muscle groups that are typically performed during regular warm up and cool down. Flowsheet Row CARDIAC REHAB PHASE II EXERCISE from 01/09/2016 in [place]  Date  01/09/16  Educator  Seward Carol  Instruction Review Code  2- meets goals/outcomes      Hands Only CPR Anytime:  -Group instruction provided by verbal instruction, video, patient participation and written materials to support subject.  Instructors co-teach with AHA video for hands only CPR.  Participants get hands on experience with mannequins.   Nutrition I class: Heart Healthy Eating:  -Group instruction provided by PowerPoint slides, verbal discussion, and written materials to support subject matter. The instructor gives an explanation and review of the Therapeutic Lifestyle Changes diet recommendations, which includes a discussion on lipid goals, dietary fat, sodium, fiber, plant stanol/sterol esters, sugar, and the components of a well-balanced, healthy diet. Flowsheet Row CARDIAC REHAB PHASE II  EXERCISE from 01/09/2016 in [place]  Date  12/11/15  Educator  RD  Instruction Review Code  2- meets goals/outcomes      Nutrition II class: Lifestyle Skills:  -Group instruction provided by PowerPoint slides, verbal discussion, and written materials to support subject matter. The instructor gives an explanation and review of label reading, grocery shopping for heart health, heart healthy recipe modifications, and ways to make healthier choices when eating out. Flowsheet Row CARDIAC REHAB PHASE II EXERCISE from 01/09/2016 in [place]  Date  12/18/15  Educator  RD  Instruction Review Code  2- meets goals/outcomes      Diabetes Question & Answer:  -Group instruction provided by PowerPoint slides, verbal discussion, and written materials to support subject matter. The instructor gives an explanation and review of diabetes co-morbidities, pre- and post-prandial blood glucose goals, pre-exercise blood glucose goals, signs, symptoms, and treatment of hypoglycemia and hyperglycemia, and foot care basics.   Diabetes Blitz:  -Group instruction provided by PowerPoint slides, verbal discussion, and written materials to support subject matter. The instructor gives an explanation and review of the physiology behind type 1 and type 2 diabetes, diabetes medications and rational behind using  different medications, pre- and post-prandial blood glucose recommendations and Hemoglobin A1c goals, diabetes diet, and exercise including blood glucose guidelines for exercising safely.    Portion Distortion:  -Group instruction provided by PowerPoint slides, verbal discussion, written materials, and food models to support subject matter. The instructor gives an explanation of serving size versus portion size, changes in portions sizes over the last 20 years, and what consists of a serving from each food group. Flowsheet Row CARDIAC REHAB PHASE II  EXERCISE from 01/09/2016 in [place]  Date  01/02/16 [Holiday Eating Survival Tips]  Educator  RD  Instruction Review Code  2- meets goals/outcomes      Stress Management:  -Group instruction provided by verbal instruction, video, and written materials to support subject matter.  Instructors review role of stress in heart disease and how to cope with stress positively.   Flowsheet Row CARDIAC REHAB PHASE II EXERCISE from 01/09/2016 in [place]  Date  12/19/15      Exercising on Your Own:  -Group instruction provided by verbal instruction, power point, and written materials to support subject.  Instructors discuss benefits of exercise, components of exercise, frequency and intensity of exercise, and end points for exercise.  Also discuss use of nitroglycerin and activating EMS.  Review options of places to exercise outside of rehab.  Review guidelines for sex with heart disease. Flowsheet Row CARDIAC REHAB PHASE II EXERCISE from 01/09/2016 in [place]  Date  12/21/15  Instruction Review Code  2- meets goals/outcomes      Cardiac Drugs I:  -Group instruction provided by verbal instruction and written materials to support subject.  Instructor reviews cardiac drug classes: antiplatelets, anticoagulants, beta blockers, and statins.  Instructor discusses reasons, side effects, and lifestyle considerations for each drug class.   Cardiac Drugs II:  -Group instruction provided by verbal instruction and written materials to support subject.  Instructor reviews cardiac drug classes: angiotensin converting enzyme inhibitors (ACE-I), angiotensin II receptor blockers (ARBs), nitrates, and calcium channel blockers.  Instructor discusses reasons, side effects, and lifestyle considerations for each drug class. Flowsheet Row CARDIAC REHAB PHASE II EXERCISE from 01/09/2016 in [place]  Date  12/12/15  Instruction Review Code  2- meets goals/outcomes      Anatomy and Physiology of the Circulatory System:  -Group instruction provided by verbal instruction, video, and written materials to support subject.  Reviews functional anatomy of heart, how it relates to various diagnoses, and what role the heart plays in the overall system. Flowsheet Row CARDIAC REHAB PHASE II EXERCISE from 01/09/2016 in [place]  Date  12/05/15  Instruction Review Code  2- meets goals/outcomes      Knowledge Questionnaire Score:     Knowledge Questionnaire Score - 11/29/15 1217      Knowledge Questionnaire Score   Pre Score 19/24      Core Components/Risk Factors/Patient Goals at Admission:     Personal Goals and Risk Factors at Admission - 11/29/15 1019      Core Components/Risk Factors/Patient Goals on Admission   Expected Outcomes weight loss and increase overall functional capacity       Core Components/Risk Factors/Patient Goals Review:      Goals and Risk Factor Review    Row Name 11/29/15 0830 11/29/15 0843 01/04/16 1055         Core Components/Risk Factors/Patient Goals Review   Personal  Goals Review Weight Management/Obesity;Other  - Increase Strength and Stamina     Review Lose weigt, 20 lbs in three months and 60lbs long term.  Be able to walk longer distance w/o SOB Lose weigt, 20 lbs in three months and 60lbs long term.  Be able to walk longer distance w/o SOB, increase leg strength and balance Pt balance is improving and pt has increased laps around walking track in cardiac rehab. Pt enjoys the program     Expected Outcomes Weight loss, increase stamina and overall functional capacity  Weight loss, increase stamina, balance, leg strength and overall functional capacity  Pt will continue to improve cardiovascular fitness, strength, endurance and balance.         Core Components/Risk Factors/Patient Goals at Discharge  (Final Review):      Goals and Risk Factor Review - 01/04/16 1055      Core Components/Risk Factors/Patient Goals Review   Personal Goals Review Increase Strength and Stamina   Review Pt balance is improving and pt has increased laps around walking track in cardiac rehab. Pt enjoys the program   Expected Outcomes Pt will continue to improve cardiovascular fitness, strength, endurance and balance.       ITP Comments:     ITP Comments    Row Name 11/29/15 0836 11/29/15 1501         ITP Comments Dr. Alfredia Ferguson -  Dr. Fransico Him, Medical Director for Cardiac Rehab         Comments:  Pt is making expected progress toward personal goals after completing  17 sessions. Pt is doing well, successful in weight loss.Repeat Psychosocial Assessment: Pt with supportive family, denies any Psychosocial needs or interventions at this time.   Recommend continued exercise and life style modification education including  stress management and relaxation techniques to decrease cardiac risk profile. Cherre Huger, BSN

## 2016-01-11 ENCOUNTER — Encounter (HOSPITAL_COMMUNITY): Payer: PPO

## 2016-01-14 ENCOUNTER — Encounter (HOSPITAL_COMMUNITY)
Admission: RE | Admit: 2016-01-14 | Discharge: 2016-01-14 | Disposition: A | Discharge status: Home / Self Care | Payer: PPO | Source: Ambulatory Visit | Attending: Cardiovascular Disease | Admitting: Cardiovascular Disease

## 2016-01-14 ENCOUNTER — Encounter (HOSPITAL_COMMUNITY): Payer: PPO

## 2016-01-14 DIAGNOSIS — Z955 Presence of coronary angioplasty implant and graft: Secondary | ICD-10-CM

## 2016-01-14 DIAGNOSIS — I2119 ST elevation (STEMI) myocardial infarction involving other coronary artery of inferior wall: Secondary | ICD-10-CM | POA: Diagnosis not present

## 2016-01-14 DIAGNOSIS — I2111 ST elevation (STEMI) myocardial infarction involving right coronary artery: Secondary | ICD-10-CM

## 2016-01-16 ENCOUNTER — Encounter (HOSPITAL_COMMUNITY): Payer: PPO

## 2016-01-16 ENCOUNTER — Encounter (HOSPITAL_COMMUNITY)
Admission: RE | Admit: 2016-01-16 | Discharge: 2016-01-16 | Disposition: A | Discharge status: Home / Self Care | Payer: PPO | Source: Ambulatory Visit | Attending: Cardiovascular Disease | Admitting: Cardiovascular Disease

## 2016-01-16 DIAGNOSIS — I2111 ST elevation (STEMI) myocardial infarction involving right coronary artery: Secondary | ICD-10-CM

## 2016-01-16 DIAGNOSIS — Z955 Presence of coronary angioplasty implant and graft: Secondary | ICD-10-CM

## 2016-01-16 DIAGNOSIS — I2119 ST elevation (STEMI) myocardial infarction involving other coronary artery of inferior wall: Secondary | ICD-10-CM | POA: Diagnosis not present

## 2016-01-17 ENCOUNTER — Telehealth: Payer: Self-pay | Admitting: Family Medicine

## 2016-01-17 DIAGNOSIS — S46011D Strain of muscle(s) and tendon(s) of the rotator cuff of right shoulder, subsequent encounter: Secondary | ICD-10-CM | POA: Diagnosis not present

## 2016-01-17 DIAGNOSIS — M25562 Pain in left knee: Secondary | ICD-10-CM | POA: Diagnosis not present

## 2016-01-17 NOTE — Telephone Encounter (Signed)
Pt will call back with the name of the glucose meter that her insurance will cover.

## 2016-01-17 NOTE — Telephone Encounter (Signed)
Pt states will cover free style freedom light. Meter and strips.  [facility] [address]

## 2016-01-18 ENCOUNTER — Encounter (HOSPITAL_COMMUNITY)
Admission: RE | Admit: 2016-01-18 | Discharge: 2016-01-18 | Disposition: A | Discharge status: Home / Self Care | Payer: PPO | Source: Ambulatory Visit | Attending: Cardiovascular Disease | Admitting: Cardiovascular Disease

## 2016-01-18 ENCOUNTER — Encounter (HOSPITAL_COMMUNITY): Payer: PPO

## 2016-01-18 DIAGNOSIS — I2119 ST elevation (STEMI) myocardial infarction involving other coronary artery of inferior wall: Secondary | ICD-10-CM | POA: Diagnosis not present

## 2016-01-18 DIAGNOSIS — I2111 ST elevation (STEMI) myocardial infarction involving right coronary artery: Secondary | ICD-10-CM

## 2016-01-18 DIAGNOSIS — Z955 Presence of coronary angioplasty implant and graft: Secondary | ICD-10-CM

## 2016-01-21 ENCOUNTER — Encounter (HOSPITAL_COMMUNITY)
Admission: RE | Admit: 2016-01-21 | Discharge: 2016-01-21 | Disposition: A | Discharge status: Home / Self Care | Payer: PPO | Source: Ambulatory Visit | Attending: Cardiovascular Disease | Admitting: Cardiovascular Disease

## 2016-01-21 ENCOUNTER — Encounter (HOSPITAL_COMMUNITY): Payer: PPO

## 2016-01-21 ENCOUNTER — Other Ambulatory Visit: Payer: Self-pay

## 2016-01-21 DIAGNOSIS — I2111 ST elevation (STEMI) myocardial infarction involving right coronary artery: Secondary | ICD-10-CM

## 2016-01-21 DIAGNOSIS — I2119 ST elevation (STEMI) myocardial infarction involving other coronary artery of inferior wall: Secondary | ICD-10-CM | POA: Diagnosis not present

## 2016-01-21 DIAGNOSIS — Z955 Presence of coronary angioplasty implant and graft: Secondary | ICD-10-CM

## 2016-01-21 MED ORDER — BLOOD GLUCOSE MONITOR KIT: PACK | 0 refills | Status: DC

## 2016-01-21 NOTE — Telephone Encounter (Signed)
Prescription sent to pharmacy as requested.

## 2016-01-23 ENCOUNTER — Encounter (HOSPITAL_COMMUNITY)
Admission: RE | Admit: 2016-01-23 | Discharge: 2016-01-23 | Disposition: A | Discharge status: Home / Self Care | Payer: PPO | Source: Ambulatory Visit | Attending: Cardiovascular Disease | Admitting: Cardiovascular Disease

## 2016-01-23 ENCOUNTER — Encounter (HOSPITAL_COMMUNITY): Payer: PPO

## 2016-01-23 DIAGNOSIS — I2119 ST elevation (STEMI) myocardial infarction involving other coronary artery of inferior wall: Secondary | ICD-10-CM | POA: Diagnosis not present

## 2016-01-23 DIAGNOSIS — I2111 ST elevation (STEMI) myocardial infarction involving right coronary artery: Secondary | ICD-10-CM

## 2016-01-23 DIAGNOSIS — Z955 Presence of coronary angioplasty implant and graft: Secondary | ICD-10-CM

## 2016-01-25 ENCOUNTER — Encounter (HOSPITAL_COMMUNITY)
Admission: RE | Admit: 2016-01-25 | Discharge: 2016-01-25 | Disposition: A | Discharge status: Home / Self Care | Payer: PPO | Source: Ambulatory Visit | Attending: Cardiovascular Disease | Admitting: Cardiovascular Disease

## 2016-01-25 ENCOUNTER — Encounter (HOSPITAL_COMMUNITY): Payer: PPO

## 2016-01-25 DIAGNOSIS — Z955 Presence of coronary angioplasty implant and graft: Secondary | ICD-10-CM

## 2016-01-25 DIAGNOSIS — I2119 ST elevation (STEMI) myocardial infarction involving other coronary artery of inferior wall: Secondary | ICD-10-CM | POA: Diagnosis not present

## 2016-01-25 DIAGNOSIS — I2111 ST elevation (STEMI) myocardial infarction involving right coronary artery: Secondary | ICD-10-CM

## 2016-01-30 ENCOUNTER — Encounter (HOSPITAL_COMMUNITY)
Admission: RE | Admit: 2016-01-30 | Discharge: 2016-01-30 | Disposition: A | Discharge status: Home / Self Care | Payer: PPO | Source: Ambulatory Visit | Attending: Cardiovascular Disease | Admitting: Cardiovascular Disease

## 2016-01-30 ENCOUNTER — Encounter (HOSPITAL_COMMUNITY): Payer: PPO

## 2016-01-30 DIAGNOSIS — I2119 ST elevation (STEMI) myocardial infarction involving other coronary artery of inferior wall: Secondary | ICD-10-CM | POA: Diagnosis not present

## 2016-01-30 DIAGNOSIS — I2111 ST elevation (STEMI) myocardial infarction involving right coronary artery: Secondary | ICD-10-CM

## 2016-01-30 DIAGNOSIS — Z955 Presence of coronary angioplasty implant and graft: Secondary | ICD-10-CM

## 2016-02-01 ENCOUNTER — Encounter (HOSPITAL_COMMUNITY)
Admission: RE | Admit: 2016-02-01 | Discharge: 2016-02-01 | Disposition: A | Discharge status: Home / Self Care | Payer: PPO | Source: Ambulatory Visit | Attending: Cardiovascular Disease | Admitting: Cardiovascular Disease

## 2016-02-01 ENCOUNTER — Encounter (HOSPITAL_COMMUNITY): Payer: PPO

## 2016-02-01 DIAGNOSIS — I2119 ST elevation (STEMI) myocardial infarction involving other coronary artery of inferior wall: Secondary | ICD-10-CM | POA: Diagnosis not present

## 2016-02-01 DIAGNOSIS — Z955 Presence of coronary angioplasty implant and graft: Secondary | ICD-10-CM

## 2016-02-01 DIAGNOSIS — I2111 ST elevation (STEMI) myocardial infarction involving right coronary artery: Secondary | ICD-10-CM

## 2016-02-06 ENCOUNTER — Encounter (HOSPITAL_COMMUNITY)
Admission: RE | Admit: 2016-02-06 | Discharge: 2016-02-06 | Disposition: A | Discharge status: Home / Self Care | Payer: PPO | Source: Ambulatory Visit | Attending: Cardiovascular Disease | Admitting: Cardiovascular Disease

## 2016-02-06 DIAGNOSIS — I2119 ST elevation (STEMI) myocardial infarction involving other coronary artery of inferior wall: Secondary | ICD-10-CM | POA: Insufficient documentation

## 2016-02-06 DIAGNOSIS — I213 ST elevation (STEMI) myocardial infarction of unspecified site: Secondary | ICD-10-CM | POA: Diagnosis not present

## 2016-02-06 DIAGNOSIS — Z9861 Coronary angioplasty status: Secondary | ICD-10-CM | POA: Diagnosis not present

## 2016-02-06 DIAGNOSIS — I2111 ST elevation (STEMI) myocardial infarction involving right coronary artery: Secondary | ICD-10-CM

## 2016-02-06 DIAGNOSIS — I2510 Atherosclerotic heart disease of native coronary artery without angina pectoris: Secondary | ICD-10-CM | POA: Diagnosis not present

## 2016-02-06 DIAGNOSIS — I10 Essential (primary) hypertension: Secondary | ICD-10-CM | POA: Insufficient documentation

## 2016-02-06 DIAGNOSIS — E785 Hyperlipidemia, unspecified: Secondary | ICD-10-CM | POA: Insufficient documentation

## 2016-02-06 DIAGNOSIS — Z955 Presence of coronary angioplasty implant and graft: Secondary | ICD-10-CM

## 2016-02-06 NOTE — Progress Notes (Signed)
Pt with increased runs of bigeminy today with exercise. Denies any changes in medications, caffeine intake or sudaphed. Pt asymptomatic. Pt did report that she took a NTG x 1 on Monday for 'angina" at rest that was relieved. She didn't think too much about it and didn;t contact the office. She thought because it was on her medication list it was ok to take and she didn't need to let anyone know. Pt denies any cp here at rehab. Will send rehab report over to Dr. Burt Knack for review and advisement. Cherre Huger, BSN

## 2016-02-07 NOTE — Progress Notes (Signed)
Cardiac Individual Treatment Plan  Patient Details  Name: Tricia [name] MRN: [id number] Date of Birth: 06-28-1945 Referring Provider:   Flowsheet Row CARDIAC REHAB PHASE II ORIENTATION from 11/29/2015 in [place]  Referring Provider  Sherren Mocha MD      Initial Encounter Date:  [place] PHASE II ORIENTATION from 11/29/2015 in [place]  Date  11/29/15  Referring Provider  Sherren Mocha MD      Visit Diagnosis: 10/01/15 ST elevation myocardial infarction involving right coronary artery ([place])  10/03/15 Status post coronary artery stent placement LAD  Patient's Home Medications on Admission:  Current Outpatient Prescriptions:  .  aspirin EC 81 MG tablet, Take 81 mg by mouth daily., Disp: , Rfl:  .  atorvastatin (LIPITOR) 80 MG tablet, Take 1 tablet (80 mg total) by mouth daily., Disp: 30 tablet, Rfl: 6 .  Biotin 10000 MCG TABS, Take 10,000 mcg by mouth daily. Dissolvable tablets, Disp: , Rfl:  .  blood glucose meter kit and supplies KIT, Dispense based on patient and insurance preference. Use to test daily. (FOR ICD-9 250.00, 250.01). 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  carvedilol (COREG) 6.25 MG tablet, Take 1 tablet (6.25 mg total) by mouth 2 (two) times daily with a meal., Disp: 180 tablet, Rfl: 3 .  Cholecalciferol (VITAMIN D3) 2000 units CHEW, Chew 2,000 Units by mouth daily., Disp: , Rfl:  .  clopidogrel (PLAVIX) 75 MG tablet, Take 1 tablet (75 mg total) by mouth daily., Disp: 30 tablet, Rfl: 6 .  diazepam (VALIUM) 2 MG tablet, Take 1 tablet (2 mg total) by mouth every 6 (six) hours as needed for anxiety., Disp: 30 tablet, Rfl: 0 .  glucose blood ([employer] ADVANCED GLUCOSE TEST) test strip, USE TO TEST 1 TIME DAILY AS DIRECTED E11.22, N18.1, Disp: 100 each, Rfl: 2 .  glucose blood test strip, Use once a day and as needed to test blood sugars, Disp: 100 each, Rfl: 12 .   HYDROcodone-acetaminophen (NORCO/VICODIN) 5-325 MG tablet, Take 1 tablet by mouth every 6 (six) hours as needed for moderate pain., Disp: , Rfl:  .  Multiple Vitamin (MULTIVITAMIN WITH MINERALS) TABS tablet, Take 1 tablet by mouth daily. Women's One a Day, Disp: , Rfl:  .  nitroGLYCERIN (NITROLINGUAL) 0.4 MG/SPRAY spray, Place 1 spray under the tongue every 5 (five) minutes as needed for chest pain., Disp: 4.9 g, Rfl: 3 .  pantoprazole (PROTONIX) 40 MG tablet, Take 1 tablet (40 mg total) by mouth daily., Disp: 30 tablet, Rfl: 6 .  tiZANidine (ZANAFLEX) 2 MG tablet, Take by mouth every 8 (eight) hours as needed for muscle spasms., Disp: , Rfl:  .  valsartan (DIOVAN) 320 MG tablet, Take 1 tablet (320 mg total) by mouth daily., Disp: , Rfl:  .  VENTOLIN HFA 108 (90 BASE) MCG/ACT inhaler, INHALE TWO PUFFS BY MOUTH EVERY SIX HOURS as needed for wheezing, Disp: 18 g, Rfl: 0  Past Medical History: Past Medical History:  Diagnosis Date  . Adenomatous polyp of colon 02/2004  . Anemia   . Anxiety   . Arthritis   . B12 deficiency   . CAD (coronary artery disease)    a. s/p remote BMS to LAD;  b. 09/2015 Inf STEMI/VF Arrest: LM nl, LAD 85p (staged PCI 2 days later w/ 3.0x32 Synergy DES), 40p/m, 25d, LCX 31m RCA 80ost/100p (2.75x32 Synergy DES), 657mEF 55-65%.  . Depression   . DEPRESSION  09/22/2006   Qualifier: Diagnosis of  By: Jimmye Norman, LPN, [place] Cunas   . Dermatophytosis of groin and perianal area   . Diet Controlled Diabetes Mellitus   . Diverticulosis   . H/O echocardiogram    a. 09/2015 Echo: EF 60-65%, no rwma, mild AI, mildly dil RA, mild to mod TR.  Marland Kitchen Hyperlipidemia   . Hypertensive heart disease   . Low back pain    l5 disc  . Morbid obesity ([place])   . Obesity   . Osteoporosis   . Sleep apnea   . Sleep apnea 10/03/2009   Resolved after gastric bypass    . TOBACCO ABUSE 10/05/2009   Qualifier: Diagnosis of  By: Stanford Breed, MD, Kandyce Rud   . Ventricular fibrillation ([place])  10/01/2015   a. In setting of inferior STEMI.    Tobacco Use: History  Smoking Status  . Former Smoker  . Packs/day: 1.50  . Years: 35.00  . Types: Cigarettes  . Quit date: 03/06/2008  Smokeless Tobacco  . Never Used    Labs: Recent Review Flowsheet Data    Labs for ITP Cardiac and Pulmonary Rehab Latest Ref Rng & Units 09/20/2012 08/19/2013 06/08/2015 09/11/2015 10/01/2015   Cholestrol 0 - 200 mg/dL - 223(H) 162 - 124   LDLCALC 0 - 99 mg/dL - 142(H) 87 - 64   LDLDIRECT mg/dL 160.7 - - 86.0 -   HDL >40 mg/dL - 37.10(L) 37.80(L) - 29(L)   Trlycerides <150 mg/dL - 220.0(H) 186.0(H) - 157(H)   Hemoglobin A1c 4.6 - 6.5 % 6.0 6.1 6.6(H) 5.6 -   TCO2 0 - 100 mmol/L - - - - 19      Capillary Blood Glucose: Lab Results  Component Value Date   GLUCAP 93 01/07/2016   GLUCAP 89 12/24/2015   GLUCAP 75 12/05/2015   GLUCAP 117 (H) 12/05/2015   GLUCAP 102 (H) 12/03/2015     Exercise Target Goals:    Exercise Program Goal: Individual exercise prescription set with THRR, safety & activity barriers. Participant demonstrates ability to understand and report RPE using BORG scale, to self-measure pulse accurately, and to acknowledge the importance of the exercise prescription.  Exercise Prescription Goal: Starting with aerobic activity 30 plus minutes a day, 3 days per week for initial exercise prescription. Provide home exercise prescription and guidelines that participant acknowledges understanding prior to discharge.  Activity Barriers & Risk Stratification:     Activity Barriers & Cardiac Risk Stratification - 11/29/15 1018      Activity Barriers & Cardiac Risk Stratification   Cardiac Risk Stratification High      6 Minute Walk:     6 Minute Walk    Row Name 11/29/15 1247         6 Minute Walk   Phase Initial     Distance 1306 feet     Walk Time 6 minutes     # of Rest Breaks 0     MPH 2.5     METS 2.7     RPE 12     Perceived Dyspnea  0     VO2 Peak 9.6      Symptoms No     Resting HR 66 bpm     Resting BP 118/70     Max Ex. HR 96 bpm     Max Ex. BP 150/80     2 Minute Post BP 108/70        Initial Exercise Prescription:     Initial Exercise  Prescription - 11/29/15 1200      Date of Initial Exercise RX and Referring Provider   Date 11/29/15   Referring Provider Sherren Mocha MD     Recumbant Bike   Level 1   Minutes 10   METs 2.5     NuStep   Level 1   Minutes 10   METs 1.7     Track   Laps 10   Minutes 10   METs 2.74     Prescription Details   Frequency (times per week) 3   Duration Progress to 45 minutes of aerobic exercise without signs/symptoms of physical distress     Intensity   THRR 40-80% of Max Heartrate 61-122   Ratings of Perceived Exertion 11-13   Perceived Dyspnea 0-4     Progression   Progression Continue to progress workloads to maintain intensity without signs/symptoms of physical distress.     Resistance Training   Training Prescription Yes   Weight 1lb   Reps 10-12      Perform Capillary Blood Glucose checks as needed.  Exercise Prescription Changes:     Exercise Prescription Changes    Row Name 12/11/15 1000 01/08/16 1100 02/05/16 1100         Exercise Review   Progression Yes Yes Yes       Response to Exercise   Blood Pressure (Admit) 138/80 132/70 128/82     Blood Pressure (Exercise) 118/60 122/80 164/84     Blood Pressure (Exit) 110/80 122/78 118/60     Heart Rate (Admit) 81 bpm 72 bpm 94 bpm     Heart Rate (Exercise) 124 bpm 109 bpm 135 bpm     Heart Rate (Exit) 80 bpm 67 bpm 94 bpm     Rating of Perceived Exertion (Exercise) '11 11 12     ' Symptoms none none none     Comments  - Reviewed HEP on 01/04/16 Reviewed HEP on 01/04/16     Duration Progress to 30 minutes of continuous aerobic without signs/symptoms of physical distress Progress to 30 minutes of continuous aerobic without signs/symptoms of physical distress Progress to 30 minutes of continuous aerobic without  signs/symptoms of physical distress     Intensity THRR unchanged THRR unchanged THRR unchanged       Progression   Average METs 2.4 3.4 3.1       Resistance Training   Training Prescription Yes Yes Yes     Weight 2lbs 4lbs 4lbs     Reps 10-12 10-12 10-12       Recumbant Bike   Level 1 2.5 3.5     Minutes '10 10 10     ' METs 1.9 4 2.8       NuStep   Level '3 4 4     ' Minutes '10 10 10     ' METs 2.6 2.8 3       Track   Laps '10 14 14     ' Minutes '10 10 10     ' METs 2.74 3.43 3.43       Home Exercise Plan   Plans to continue exercise at  - Home  Reviewed on 01/04/16 see progress note Home  Reviewed on 01/04/16 see progress note     Frequency  - Add 2 additional days to program exercise sessions. Add 2 additional days to program exercise sessions.        Exercise Comments:     Exercise Comments    Row Name 12/11/15 1020 01/08/16 1119  02/05/16 1107       Exercise Comments Pt is tolerating exercise well; will continue to monitor exercise progression. Reviewed METs and goals. Pt is tolerating exercise well; will continue to monitor exercise progression. Reviewed METs and goals. Pt is tolerating exercise well; will continue to monitor exercise progression.        Discharge Exercise Prescription (Final Exercise Prescription Changes):     Exercise Prescription Changes - 02/05/16 1100      Exercise Review   Progression Yes     Response to Exercise   Blood Pressure (Admit) 128/82   Blood Pressure (Exercise) 164/84   Blood Pressure (Exit) 118/60   Heart Rate (Admit) 94 bpm   Heart Rate (Exercise) 135 bpm   Heart Rate (Exit) 94 bpm   Rating of Perceived Exertion (Exercise) 12   Symptoms none   Comments Reviewed HEP on 01/04/16   Duration Progress to 30 minutes of continuous aerobic without signs/symptoms of physical distress   Intensity THRR unchanged     Progression   Average METs 3.1     Resistance Training   Training Prescription Yes   Weight 4lbs   Reps 10-12      Recumbant Bike   Level 3.5   Minutes 10   METs 2.8     NuStep   Level 4   Minutes 10   METs 3     Track   Laps 14   Minutes 10   METs 3.43     Home Exercise Plan   Plans to continue exercise at Home  Reviewed on 01/04/16 see progress note   Frequency Add 2 additional days to program exercise sessions.      Nutrition:  Target Goals: Understanding of nutrition guidelines, daily intake of sodium <1529m, cholesterol <2055m calories 30% from fat and 7% or less from saturated fats, daily to have 5 or more servings of fruits and vegetables.  Biometrics:     Pre Biometrics - 11/29/15 1224      Pre Biometrics   Flexibility 7.5 in   Single Leg Stand 3.68 seconds       Nutrition Therapy Plan and Nutrition Goals:     Nutrition Therapy & Goals - 11/29/15 1416      Nutrition Therapy   Diet Carb Modified, Therapeutic Lifestyle Changes     Personal Nutrition Goals   Personal Goal #1 1-2 lb wt loss/week to a wt loss goal of 6-24 lb [facility] and counsel regarding individualized specific dietary modifications aiming towards targeted core components such as weight, hypertension, lipid management, diabetes, heart failure and other comorbidities.   Expected Outcomes Short Term Goal: Understand basic principles of dietary content, such as calories, fat, sodium, cholesterol and nutrients.;Long Term Goal: Adherence to prescribed nutrition plan.      Nutrition Discharge: Nutrition Scores:     Nutrition Assessments - 11/29/15 1415      MEDFICTS Scores   Pre Score 12      Nutrition Goals Re-Evaluation:   Psychosocial: Target Goals: Acknowledge presence or absence of depression, maximize coping skills, provide positive support system. Participant is able to verbalize types and ability to use techniques and skills needed for reducing stress and depression.  Initial Review & Psychosocial  Screening:     Initial Psych Review & Screening - 11/29/15 1515      Initial Review   Current issues with Current Anxiety/Panic  Family Dynamics   Good Support System? Yes   Comments Brief initial review, pt with some anxiety related to participating in cardiac rehab. Offered support and encouragement.     Barriers   Psychosocial barriers to participate in program The patient should benefit from training in stress management and relaxation.     Screening Interventions   Interventions Encouraged to exercise      Quality of Life Scores:     Quality of Life - 11/29/15 1224      Quality of Life Scores   Health/Function Pre 23.92 %   Socioeconomic Pre 21 %   Psych/Spiritual Pre 24.2 %   Family Pre 28.5 %   GLOBAL Pre 23.92 %      PHQ-9: Recent Review Flowsheet Data    Depression screen [facility] 2/9 12/03/2015 11/29/2015 10/15/2015 06/22/2015 06/08/2015   Decreased Interest 0 0 1 0 0   Down, Depressed, Hopeless 0 0 1 0 0   PHQ - 2 Score 0 0 2 0 0   Altered sleeping - - 0 - -   Tired, decreased energy - - 0 - -   Change in appetite - - 3 - -   Feeling bad or failure about yourself  - - 0 - -   Trouble concentrating - - 1 - -   Moving slowly or fidgety/restless - - 0 - -   Suicidal thoughts - - 0 - -   PHQ-9 Score - - 6 - -   Difficult doing work/chores - - Not difficult at all - -      Psychosocial Evaluation and Intervention:     Psychosocial Evaluation - 02/07/16 1614      Psychosocial Evaluation & Interventions   Interventions Encouraged to exercise with the program and follow exercise prescription;Stress management education;Relaxation education   Comments No psychosocial needs identified, no further interventions needed   Continued Psychosocial Services Needed No      Psychosocial Re-Evaluation:     Psychosocial Re-Evaluation    Row Name 12/13/15 1359 01/10/16 1421 02/07/16 1614         Psychosocial Re-Evaluation   Interventions Encouraged to attend  Cardiac Rehabilitation for the exercise;Relaxation education;Stress management education Encouraged to attend Cardiac Rehabilitation for the exercise;Stress management education;Relaxation education Encouraged to attend Cardiac Rehabilitation for the exercise;Stress management education;Relaxation education     Comments  -  - Pt with supported family. Positve healthy outlook.  Interacts with fellow participants.     Continued Psychosocial Services Needed  - No No        Vocational Rehabilitation: Provide vocational rehab assistance to qualifying candidates.   Vocational Rehab Evaluation & Intervention:     Vocational Rehab - 11/29/15 1546      Initial Vocational Rehab Evaluation & Intervention   Assessment shows need for Vocational Rehabilitation No  Pt does not plan to return to work. Pt is retired.       Education: Education Goals: Education classes will be provided on a weekly basis, covering required topics. Participant will state understanding/return demonstration of topics presented.  Learning Barriers/Preferences:     Learning Barriers/Preferences - 11/29/15 1696      Learning Barriers/Preferences   Learning Barriers Sight   Learning Preferences Group Instruction;Individual Instruction;Skilled Demonstration;Verbal Instruction;Audio      Education Topics: Count Your Pulse:  -Group instruction provided by verbal instruction, demonstration, patient participation and written materials to support subject.  Instructors address importance of being able to find your pulse and how to count  your pulse when at home without a heart monitor.  Patients get hands on experience counting their pulse with staff help and individually. Flowsheet Row CARDIAC REHAB PHASE II EXERCISE from 02/06/2016 in [place]  Date  12/07/15  Educator  Andi Hence, Rn  Instruction Review Code  2- meets goals/outcomes      Heart Attack, Angina, and Risk Factor Modification:   -Group instruction provided by verbal instruction, video, and written materials to support subject.  Instructors address signs and symptoms of angina and heart attacks.    Also discuss risk factors for heart disease and how to make changes to improve heart health risk factors. Flowsheet Row CARDIAC REHAB PHASE II EXERCISE from 02/06/2016 in [place]  Date  01/04/16 [hypertension]  Educator  Andi Hence, RN  Instruction Review Code  2- meets goals/outcomes      Functional Fitness:  -Group instruction provided by verbal instruction, demonstration, patient participation, and written materials to support subject.  Instructors address safety measures for doing things around the house.  Discuss how to get up and down off the floor, how to pick things up properly, how to safely get out of a chair without assistance, and balance training. Flowsheet Row CARDIAC REHAB PHASE II EXERCISE from 02/06/2016 in [place]  Date  12/14/15  Instruction Review Code  2- meets goals/outcomes      Meditation and Mindfulness:  -Group instruction provided by verbal instruction, patient participation, and written materials to support subject.  Instructor addresses importance of mindfulness and meditation practice to help reduce stress and improve awareness.  Instructor also leads participants through a meditation exercise.  Flowsheet Row CARDIAC REHAB PHASE II EXERCISE from 02/06/2016 in [place]  Date  01/30/16  Instruction Review Code  2- meets goals/outcomes      Stretching for Flexibility and Mobility:  -Group instruction provided by verbal instruction, patient participation, and written materials to support subject.  Instructors lead participants through series of stretches that are designed to increase flexibility thus improving mobility.  These stretches are additional exercise for major muscle groups that are  typically performed during regular warm up and cool down. Flowsheet Row CARDIAC REHAB PHASE II EXERCISE from 02/06/2016 in [place]  Date  01/09/16  Educator  Seward Carol  Instruction Review Code  2- meets goals/outcomes      Hands Only CPR Anytime:  -Group instruction provided by verbal instruction, video, patient participation and written materials to support subject.  Instructors co-teach with [facility] video for hands only CPR.  Participants get hands on experience with mannequins. Flowsheet Row CARDIAC REHAB PHASE II EXERCISE from 02/06/2016 in [place]  Date  01/16/16  Instruction Review Code  2- meets goals/outcomes      Nutrition I class: Heart Healthy Eating:  -Group instruction provided by PowerPoint slides, verbal discussion, and written materials to support subject matter. The instructor gives an explanation and review of the Therapeutic Lifestyle Changes diet recommendations, which includes a discussion on lipid goals, dietary fat, sodium, fiber, plant stanol/sterol esters, sugar, and the components of a well-balanced, healthy diet. Flowsheet Row CARDIAC REHAB PHASE II EXERCISE from 02/06/2016 in [place]  Date  12/11/15  Educator  RD  Instruction Review Code  2- meets goals/outcomes      Nutrition II class: Lifestyle Skills:  -Group instruction provided by PowerPoint  slides, verbal discussion, and written materials to support subject matter. The instructor gives an explanation and review of label reading, grocery shopping for heart health, heart healthy recipe modifications, and ways to make healthier choices when eating out. Flowsheet Row CARDIAC REHAB PHASE II EXERCISE from 02/06/2016 in [place]  Date  12/18/15  Educator  RD  Instruction Review Code  2- meets goals/outcomes      Diabetes Question & Answer:  -Group instruction provided by  PowerPoint slides, verbal discussion, and written materials to support subject matter. The instructor gives an explanation and review of diabetes co-morbidities, pre- and post-prandial blood glucose goals, pre-exercise blood glucose goals, signs, symptoms, and treatment of hypoglycemia and hyperglycemia, and foot care basics. Flowsheet Row CARDIAC REHAB PHASE II EXERCISE from 02/06/2016 in [place]  Date  01/18/16  Educator  RD  Instruction Review Code  2- meets goals/outcomes      Diabetes Blitz:  -Group instruction provided by PowerPoint slides, verbal discussion, and written materials to support subject matter. The instructor gives an explanation and review of the physiology behind type 1 and type 2 diabetes, diabetes medications and rational behind using different medications, pre- and post-prandial blood glucose recommendations and Hemoglobin A1c goals, diabetes diet, and exercise including blood glucose guidelines for exercising safely.    Portion Distortion:  -Group instruction provided by PowerPoint slides, verbal discussion, written materials, and food models to support subject matter. The instructor gives an explanation of serving size versus portion size, changes in portions sizes over the last 20 years, and what consists of a serving from each food group. Flowsheet Row CARDIAC REHAB PHASE II EXERCISE from 02/06/2016 in [place]  Date  02/06/16  Educator  RD  Instruction Review Code  2- meets goals/outcomes      Stress Management:  -Group instruction provided by verbal instruction, video, and written materials to support subject matter.  Instructors review role of stress in heart disease and how to cope with stress positively.   Flowsheet Row CARDIAC REHAB PHASE II EXERCISE from 02/06/2016 in [place]  Date  12/19/15      Exercising on Your Own:  -Group instruction provided by  verbal instruction, power point, and written materials to support subject.  Instructors discuss benefits of exercise, components of exercise, frequency and intensity of exercise, and end points for exercise.  Also discuss use of nitroglycerin and activating EMS.  Review options of places to exercise outside of rehab.  Review guidelines for sex with heart disease. Flowsheet Row CARDIAC REHAB PHASE II EXERCISE from 02/06/2016 in [place]  Date  12/21/15  Instruction Review Code  2- meets goals/outcomes      Cardiac Drugs I:  -Group instruction provided by verbal instruction and written materials to support subject.  Instructor reviews cardiac drug classes: antiplatelets, anticoagulants, beta blockers, and statins.  Instructor discusses reasons, side effects, and lifestyle considerations for each drug class.   Cardiac Drugs II:  -Group instruction provided by verbal instruction and written materials to support subject.  Instructor reviews cardiac drug classes: angiotensin converting enzyme inhibitors (ACE-I), angiotensin II receptor blockers (ARBs), nitrates, and calcium channel blockers.  Instructor discusses reasons, side effects, and lifestyle considerations for each drug class. Flowsheet Row CARDIAC REHAB PHASE II EXERCISE from 02/06/2016 in [place]  Date  12/12/15  Instruction Review Code  2- meets goals/outcomes  Anatomy and Physiology of the Circulatory System:  -Group instruction provided by verbal instruction, video, and written materials to support subject.  Reviews functional anatomy of heart, how it relates to various diagnoses, and what role the heart plays in the overall system. Flowsheet Row CARDIAC REHAB PHASE II EXERCISE from 02/06/2016 in [place]  Date  12/05/15  Instruction Review Code  2- meets goals/outcomes      Knowledge Questionnaire Score:     Knowledge  Questionnaire Score - 11/29/15 1217      Knowledge Questionnaire Score   Pre Score 19/24      Core Components/Risk Factors/Patient Goals at Admission:     Personal Goals and Risk Factors at Admission - 11/29/15 1019      Core Components/Risk Factors/Patient Goals on Admission   Expected Outcomes weight loss and increase overall functional capacity       Core Components/Risk Factors/Patient Goals Review:      Goals and Risk Factor Review    Row Name 11/29/15 0830 11/29/15 0843 01/04/16 1055 02/05/16 1108       Core Components/Risk Factors/Patient Goals Review   Personal Goals Review Weight Management/Obesity;Other  - Increase Strength and Stamina Increase Strength and Stamina;Weight Management/Obesity    Review Lose weigt, 20 lbs in three months and 60lbs long term.  Be able to walk longer distance w/o SOB Lose weigt, 20 lbs in three months and 60lbs long term.  Be able to walk longer distance w/o SOB, increase leg strength and balance Pt balance is improving and pt has increased laps around walking track in cardiac rehab. Pt enjoys the program Pt stated " weight is down and going in the right direction" and " increased in stamina and energy levels" Pt weight is down ~2-3lbs    Expected Outcomes Weight loss, increase stamina and overall functional capacity  Weight loss, increase stamina, balance, leg strength and overall functional capacity  Pt will continue to improve cardiovascular fitness, strength, endurance and balance.  Pt will continue to improve cardiovascular fitness, strength, endurance and continue with weightloss       Core Components/Risk Factors/Patient Goals at Discharge (Final Review):      Goals and Risk Factor Review - 02/05/16 1108      Core Components/Risk Factors/Patient Goals Review   Personal Goals Review Increase Strength and Stamina;Weight Management/Obesity   Review Pt stated " weight is down and going in the right direction" and " increased in stamina  and energy levels" Pt weight is down ~2-3lbs   Expected Outcomes Pt will continue to improve cardiovascular fitness, strength, endurance and continue with weightloss      ITP Comments:     ITP Comments    Row Name 11/29/15 0836 11/29/15 1501 02/01/16 1233       ITP Comments Dr. Alfredia Ferguson -  Dr. Fransico Him, Medical Director for Cardiac Rehab attended CPR education class.  outcomes and goals met         Comments:  Pt is making expected progress toward personal goals after completing  26 sessions.Repeat Psychosocial Assessment: Pt with supportive family, denies any Psychosocial needs or interventions at this time.  Pt observed interacting positively with fellow participants.  Recommend continued exercise and life style modification education including  stress management and relaxation techniques to decrease cardiac risk profile. Cherre Huger, BSN

## 2016-02-08 ENCOUNTER — Encounter (HOSPITAL_COMMUNITY)
Admission: RE | Admit: 2016-02-08 | Discharge: 2016-02-08 | Disposition: A | Discharge status: Home / Self Care | Payer: PPO | Source: Ambulatory Visit | Attending: Cardiovascular Disease | Admitting: Cardiovascular Disease

## 2016-02-08 DIAGNOSIS — Z955 Presence of coronary angioplasty implant and graft: Secondary | ICD-10-CM

## 2016-02-08 DIAGNOSIS — I2119 ST elevation (STEMI) myocardial infarction involving other coronary artery of inferior wall: Secondary | ICD-10-CM | POA: Diagnosis not present

## 2016-02-08 DIAGNOSIS — I2111 ST elevation (STEMI) myocardial infarction involving right coronary artery: Secondary | ICD-10-CM

## 2016-02-11 ENCOUNTER — Encounter (HOSPITAL_COMMUNITY): Payer: PPO

## 2016-02-15 ENCOUNTER — Encounter (HOSPITAL_COMMUNITY)
Admission: RE | Admit: 2016-02-15 | Discharge: 2016-02-15 | Disposition: A | Discharge status: Home / Self Care | Payer: PPO | Source: Ambulatory Visit | Attending: Cardiovascular Disease | Admitting: Cardiovascular Disease

## 2016-02-15 DIAGNOSIS — I2119 ST elevation (STEMI) myocardial infarction involving other coronary artery of inferior wall: Secondary | ICD-10-CM | POA: Diagnosis not present

## 2016-02-15 DIAGNOSIS — I2111 ST elevation (STEMI) myocardial infarction involving right coronary artery: Secondary | ICD-10-CM

## 2016-02-15 DIAGNOSIS — Z955 Presence of coronary angioplasty implant and graft: Secondary | ICD-10-CM

## 2016-02-18 ENCOUNTER — Encounter (HOSPITAL_COMMUNITY)
Admission: RE | Admit: 2016-02-18 | Discharge: 2016-02-18 | Disposition: A | Discharge status: Home / Self Care | Payer: PPO | Source: Ambulatory Visit | Attending: Cardiovascular Disease | Admitting: Cardiovascular Disease

## 2016-02-18 DIAGNOSIS — Z955 Presence of coronary angioplasty implant and graft: Secondary | ICD-10-CM

## 2016-02-18 DIAGNOSIS — I2119 ST elevation (STEMI) myocardial infarction involving other coronary artery of inferior wall: Secondary | ICD-10-CM | POA: Diagnosis not present

## 2016-02-18 DIAGNOSIS — I2111 ST elevation (STEMI) myocardial infarction involving right coronary artery: Secondary | ICD-10-CM

## 2016-02-20 ENCOUNTER — Encounter (HOSPITAL_COMMUNITY): Payer: PPO

## 2016-02-22 ENCOUNTER — Encounter (HOSPITAL_COMMUNITY): Payer: PPO

## 2016-02-23 ENCOUNTER — Other Ambulatory Visit: Payer: Self-pay | Admitting: Family Medicine

## 2016-02-25 ENCOUNTER — Encounter (HOSPITAL_COMMUNITY)
Admission: RE | Admit: 2016-02-25 | Discharge: 2016-02-25 | Disposition: A | Discharge status: Home / Self Care | Payer: PPO | Source: Ambulatory Visit | Attending: Cardiovascular Disease | Admitting: Cardiovascular Disease

## 2016-02-25 ENCOUNTER — Telehealth (HOSPITAL_COMMUNITY): Payer: Self-pay | Admitting: *Deleted

## 2016-02-25 DIAGNOSIS — I2119 ST elevation (STEMI) myocardial infarction involving other coronary artery of inferior wall: Secondary | ICD-10-CM | POA: Diagnosis not present

## 2016-02-25 DIAGNOSIS — Z955 Presence of coronary angioplasty implant and graft: Secondary | ICD-10-CM

## 2016-02-25 DIAGNOSIS — I2111 ST elevation (STEMI) myocardial infarction involving right coronary artery: Secondary | ICD-10-CM

## 2016-02-25 NOTE — Telephone Encounter (Signed)
-----   Message from Barkley Boards, RN sent at 02/25/2016 10:19 AM EST ----- Regarding: follow-up Hey Tricia [name],  Sorry for the delayed response but I wanted to let you know that Dr Burt Knack did review your fax from 02/06/16. Dr Burt Knack said pt okay to continue cardiac rehab but notify the office is increase use of NTG.  Thank you, Lauren RN/Dr Burt Knack

## 2016-02-27 ENCOUNTER — Encounter (HOSPITAL_COMMUNITY)
Admission: RE | Admit: 2016-02-27 | Discharge: 2016-02-27 | Disposition: A | Discharge status: Home / Self Care | Payer: PPO | Source: Ambulatory Visit | Attending: Cardiovascular Disease | Admitting: Cardiovascular Disease

## 2016-02-27 DIAGNOSIS — I2119 ST elevation (STEMI) myocardial infarction involving other coronary artery of inferior wall: Secondary | ICD-10-CM | POA: Diagnosis not present

## 2016-02-27 DIAGNOSIS — Z955 Presence of coronary angioplasty implant and graft: Secondary | ICD-10-CM

## 2016-02-27 DIAGNOSIS — I2111 ST elevation (STEMI) myocardial infarction involving right coronary artery: Secondary | ICD-10-CM

## 2016-02-28 ENCOUNTER — Encounter: Payer: Self-pay | Admitting: Cardiovascular Disease

## 2016-02-29 ENCOUNTER — Encounter (HOSPITAL_COMMUNITY)
Admission: RE | Admit: 2016-02-29 | Discharge: 2016-02-29 | Disposition: A | Discharge status: Home / Self Care | Payer: PPO | Source: Ambulatory Visit | Attending: Cardiovascular Disease | Admitting: Cardiovascular Disease

## 2016-02-29 DIAGNOSIS — I2119 ST elevation (STEMI) myocardial infarction involving other coronary artery of inferior wall: Secondary | ICD-10-CM | POA: Diagnosis not present

## 2016-02-29 DIAGNOSIS — Z955 Presence of coronary angioplasty implant and graft: Secondary | ICD-10-CM

## 2016-02-29 DIAGNOSIS — I2111 ST elevation (STEMI) myocardial infarction involving right coronary artery: Secondary | ICD-10-CM

## 2016-03-03 ENCOUNTER — Encounter (HOSPITAL_COMMUNITY)
Admission: RE | Admit: 2016-03-03 | Discharge: 2016-03-03 | Disposition: A | Discharge status: Home / Self Care | Payer: PPO | Source: Ambulatory Visit | Attending: Cardiovascular Disease | Admitting: Cardiovascular Disease

## 2016-03-03 DIAGNOSIS — I2111 ST elevation (STEMI) myocardial infarction involving right coronary artery: Secondary | ICD-10-CM

## 2016-03-03 DIAGNOSIS — Z955 Presence of coronary angioplasty implant and graft: Secondary | ICD-10-CM

## 2016-03-03 DIAGNOSIS — I2119 ST elevation (STEMI) myocardial infarction involving other coronary artery of inferior wall: Secondary | ICD-10-CM | POA: Diagnosis not present

## 2016-03-05 ENCOUNTER — Other Ambulatory Visit: Payer: PPO | Admitting: *Deleted

## 2016-03-05 ENCOUNTER — Encounter (HOSPITAL_COMMUNITY)
Admission: RE | Admit: 2016-03-05 | Discharge: 2016-03-05 | Disposition: A | Discharge status: Home / Self Care | Payer: PPO | Source: Ambulatory Visit | Attending: Cardiovascular Disease | Admitting: Cardiovascular Disease

## 2016-03-05 DIAGNOSIS — E785 Hyperlipidemia, unspecified: Secondary | ICD-10-CM

## 2016-03-05 DIAGNOSIS — I10 Essential (primary) hypertension: Secondary | ICD-10-CM | POA: Diagnosis not present

## 2016-03-05 DIAGNOSIS — I2119 ST elevation (STEMI) myocardial infarction involving other coronary artery of inferior wall: Secondary | ICD-10-CM | POA: Diagnosis not present

## 2016-03-05 DIAGNOSIS — Z955 Presence of coronary angioplasty implant and graft: Secondary | ICD-10-CM

## 2016-03-05 DIAGNOSIS — I2111 ST elevation (STEMI) myocardial infarction involving right coronary artery: Secondary | ICD-10-CM

## 2016-03-05 NOTE — Addendum Note (Signed)
Addended by: Eulis Foster on: 03/05/2016 09:48 AM   Modules accepted: Orders

## 2016-03-06 ENCOUNTER — Telehealth: Payer: Self-pay | Admitting: Family Medicine

## 2016-03-06 ENCOUNTER — Other Ambulatory Visit: Payer: PPO

## 2016-03-06 LAB — HEPATIC FUNCTION PANEL
ALT: 19 IU/L (ref 0–32)
AST: 23 IU/L (ref 0–40)
Albumin: 4.1 g/dL (ref 3.6–4.8)
Alkaline Phosphatase: 177 IU/L — ABNORMAL HIGH (ref 39–117)
Bilirubin Total: 0.4 mg/dL (ref 0.0–1.2)
Bilirubin, Direct: 0.15 mg/dL (ref 0.00–0.40)
Total Protein: 6.3 g/dL (ref 6.0–8.5)

## 2016-03-06 LAB — LIPID PANEL
Chol/HDL Ratio: 2.8 ratio units (ref 0.0–4.4)
Cholesterol, Total: 125 mg/dL (ref 100–199)
HDL: 45 mg/dL (ref >39)
LDL Calculated: 63 mg/dL (ref 0–99)
Triglycerides: 85 mg/dL (ref 0–149)
VLDL Cholesterol Cal: 17 mg/dL (ref 5–40)

## 2016-03-06 NOTE — Progress Notes (Signed)
Cardiac Individual Treatment Plan  Patient Details  Name: Tricia [name] MRN: [id number] Date of Birth: 1945-08-19 Referring Provider:   Flowsheet Row CARDIAC REHAB PHASE II ORIENTATION from 11/29/2015 in [place]  Referring Provider  Sherren Mocha MD      Initial Encounter Date:  [place] PHASE II ORIENTATION from 11/29/2015 in [place]  Date  11/29/15  Referring Provider  Sherren Mocha MD      Visit Diagnosis: 10/01/15 ST elevation myocardial infarction involving right coronary artery ([place])  10/03/15 Status post coronary artery stent placement LAD  Patient's Home Medications on Admission:  Current Outpatient Prescriptions:  .  aspirin EC 81 MG tablet, Take 81 mg by mouth daily., Disp: , Rfl:  .  atorvastatin (LIPITOR) 80 MG tablet, Take 1 tablet (80 mg total) by mouth daily., Disp: 30 tablet, Rfl: 6 .  Biotin 10000 MCG TABS, Take 10,000 mcg by mouth daily. Dissolvable tablets, Disp: , Rfl:  .  blood glucose meter kit and supplies KIT, Dispense based on patient and insurance preference. Use to test daily. (FOR ICD-9 250.00, 250.01). 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  carvedilol (COREG) 6.25 MG tablet, Take 1 tablet (6.25 mg total) by mouth 2 (two) times daily with a meal., Disp: 180 tablet, Rfl: 3 .  Cholecalciferol (VITAMIN D3) 2000 units CHEW, Chew 2,000 Units by mouth daily., Disp: , Rfl:  .  clopidogrel (PLAVIX) 75 MG tablet, Take 1 tablet (75 mg total) by mouth daily., Disp: 30 tablet, Rfl: 6 .  diazepam (VALIUM) 2 MG tablet, Take 1 tablet (2 mg total) by mouth every 6 (six) hours as needed for anxiety., Disp: 30 tablet, Rfl: 0 .  glucose blood ([employer] ADVANCED GLUCOSE TEST) test strip, USE TO TEST 1 TIME DAILY AS DIRECTED E11.22, N18.1, Disp: 100 each, Rfl: 2 .  glucose blood test strip, Use once a day and as needed to test blood sugars, Disp: 100 each, Rfl: 12 .   HYDROcodone-acetaminophen (NORCO/VICODIN) 5-325 MG tablet, Take 1 tablet by mouth every 6 (six) hours as needed for moderate pain., Disp: , Rfl:  .  Multiple Vitamin (MULTIVITAMIN WITH MINERALS) TABS tablet, Take 1 tablet by mouth daily. Women's One a Day, Disp: , Rfl:  .  nitroGLYCERIN (NITROLINGUAL) 0.4 MG/SPRAY spray, Place 1 spray under the tongue every 5 (five) minutes as needed for chest pain., Disp: 4.9 g, Rfl: 3 .  pantoprazole (PROTONIX) 40 MG tablet, Take 1 tablet (40 mg total) by mouth daily., Disp: 30 tablet, Rfl: 6 .  tiZANidine (ZANAFLEX) 2 MG tablet, Take by mouth every 8 (eight) hours as needed for muscle spasms., Disp: , Rfl:  .  valsartan (DIOVAN) 320 MG tablet, Take 1 tablet (320 mg total) by mouth daily., Disp: , Rfl:  .  valsartan (DIOVAN) 320 MG tablet, TAKE 1 TABLET (320 MG TOTAL) BY MOUTH DAILY., Disp: 30 tablet, Rfl: 11 .  VENTOLIN HFA 108 (90 BASE) MCG/ACT inhaler, INHALE TWO PUFFS BY MOUTH EVERY SIX HOURS as needed for wheezing, Disp: 18 g, Rfl: 0  Past Medical History: Past Medical History:  Diagnosis Date  . Adenomatous polyp of colon 02/2004  . Anemia   . Anxiety   . Arthritis   . B12 deficiency   . CAD (coronary artery disease)    a. s/p remote BMS to LAD;  b. 09/2015 Inf STEMI/VF Arrest: LM nl, LAD 85p (staged PCI 2 days later w/ 3.0x32  Synergy DES), 40p/m, 25d, LCX 46m RCA 80ost/100p (2.75x32 Synergy DES), 669mEF 55-65%.  . Depression   . DEPRESSION 09/22/2006   Qualifier: Diagnosis of  By: WiJimmye NormanLPN, BoWinfield Cunas . Dermatophytosis of groin and perianal area   . Diet Controlled Diabetes Mellitus   . Diverticulosis   . H/O echocardiogram    a. 09/2015 Echo: EF 60-65%, no rwma, mild AI, mildly dil RA, mild to mod TR.  . Marland Kitchenyperlipidemia   . Hypertensive heart disease   . Low back pain    l5 disc  . Morbid obesity ([place]  . Obesity   . Osteoporosis   . Sleep apnea   . Sleep apnea 10/03/2009   Resolved after gastric bypass    . TOBACCO ABUSE 10/05/2009    Qualifier: Diagnosis of  By: CrStanford BreedMD, FAKandyce Rud . Ventricular fibrillation ([place]08/28/2017   a. In setting of inferior STEMI.    Tobacco Use: History  Smoking Status  . Former Smoker  . Packs/day: 1.50  . Years: 35.00  . Types: Cigarettes  . Quit date: 03/06/2008  Smokeless Tobacco  . Never Used    Labs: Recent Review Flowsheet Data    Labs for ITP Cardiac and Pulmonary Rehab Latest Ref Rng & Units 08/19/2013 06/08/2015 09/11/2015 10/01/2015 03/05/2016   Cholestrol 100 - 199 mg/dL 223(H) 162 - 124 125   LDLCALC 0 - 99 mg/dL 142(H) 87 - 64 63   LDLDIRECT mg/dL - - 86.0 - -   HDL >39 mg/dL 37.10(L) 37.80(L) - 29(L) 45   Trlycerides 0 - 149 mg/dL 220.0(H) 186.0(H) - 157(H) 85   Hemoglobin A1c 4.6 - 6.5 % 6.1 6.6(H) 5.6 - -   TCO2 0 - 100 mmol/L - - - 19 -      Capillary Blood Glucose: Lab Results  Component Value Date   GLUCAP 93 01/07/2016   GLUCAP 89 12/24/2015   GLUCAP 75 12/05/2015   GLUCAP 117 (H) 12/05/2015   GLUCAP 102 (H) 12/03/2015     Exercise Target Goals:    Exercise Program Goal: Individual exercise prescription set with THRR, safety & activity barriers. Participant demonstrates ability to understand and report RPE using BORG scale, to self-measure pulse accurately, and to acknowledge the importance of the exercise prescription.  Exercise Prescription Goal: Starting with aerobic activity 30 plus minutes a day, 3 days per week for initial exercise prescription. Provide home exercise prescription and guidelines that participant acknowledges understanding prior to discharge.  Activity Barriers & Risk Stratification:     Activity Barriers & Cardiac Risk Stratification - 11/29/15 1018      Activity Barriers & Cardiac Risk Stratification   Cardiac Risk Stratification High      6 Minute Walk:     6 Minute Walk    Row Name 11/29/15 1247         6 Minute Walk   Phase Initial     Distance 1306 feet     Walk Time 6 minutes     # of Rest  Breaks 0     MPH 2.5     METS 2.7     RPE 12     Perceived Dyspnea  0     VO2 Peak 9.6     Symptoms No     Resting HR 66 bpm     Resting BP 118/70     Max Ex. HR 96 bpm     Max Ex. BP 150/80  2 Minute Post BP 108/70        Initial Exercise Prescription:     Initial Exercise Prescription - 11/29/15 1200      Date of Initial Exercise RX and Referring Provider   Date 11/29/15   Referring Provider Sherren Mocha MD     Recumbant Bike   Level 1   Minutes 10   METs 2.5     NuStep   Level 1   Minutes 10   METs 1.7     Track   Laps 10   Minutes 10   METs 2.74     Prescription Details   Frequency (times per week) 3   Duration Progress to 45 minutes of aerobic exercise without signs/symptoms of physical distress     Intensity   THRR 40-80% of Max Heartrate 61-122   Ratings of Perceived Exertion 11-13   Perceived Dyspnea 0-4     Progression   Progression Continue to progress workloads to maintain intensity without signs/symptoms of physical distress.     Resistance Training   Training Prescription Yes   Weight 1lb   Reps 10-12      Perform Capillary Blood Glucose checks as needed.  Exercise Prescription Changes:     Exercise Prescription Changes    Row Name 12/11/15 1000 01/08/16 1100 02/05/16 1100 03/04/16 1400       Exercise Review   Progression Yes Yes Yes  -      Response to Exercise   Blood Pressure (Admit) 138/80 132/70 128/82 124/78    Blood Pressure (Exercise) 118/60 122/80 164/84 128/82    Blood Pressure (Exit) 110/80 122/78 118/60 132/74    Heart Rate (Admit) 81 bpm 72 bpm 94 bpm 93 bpm    Heart Rate (Exercise) 124 bpm 109 bpm 135 bpm 114 bpm    Heart Rate (Exit) 80 bpm 67 bpm 94 bpm 63 bpm    Rating of Perceived Exertion (Exercise) '11 11 12 12    ' Symptoms none none none none    Comments  - Reviewed HEP on 01/04/16 Reviewed HEP on 01/04/16 Reviewed HEP on 01/04/16    Duration Progress to 30 minutes of continuous aerobic without  signs/symptoms of physical distress Progress to 30 minutes of continuous aerobic without signs/symptoms of physical distress Progress to 30 minutes of continuous aerobic without signs/symptoms of physical distress Progress to 30 minutes of continuous aerobic without signs/symptoms of physical distress    Intensity THRR unchanged THRR unchanged THRR unchanged THRR unchanged      Progression   Average METs 2.4 3.4 3.1 3.1      Resistance Training   Training Prescription Yes Yes Yes Yes    Weight 2lbs 4lbs 4lbs 4lbs    Reps 10-12 10-12 10-12 10-12      Recumbant Bike   Level 1 2.5 3.5 3.5    Minutes '10 10 10 10    ' METs 1.9 4 2.8 2.8      NuStep   Level '3 4 4 4    ' Minutes '10 10 10 10    ' METs 2.6 2.8 3 3.1      Track   Laps '10 14 14 14    ' Minutes '10 10 10 10    ' METs 2.74 3.43 3.43 3.43      Home Exercise Plan   Plans to continue exercise at  - Home  Reviewed on 01/04/16 see progress note Home  Reviewed on 01/04/16 see progress note Home  Reviewed on 01/04/16 see progress  note    Frequency  - Add 2 additional days to program exercise sessions. Add 2 additional days to program exercise sessions. Add 2 additional days to program exercise sessions.       Exercise Comments:     Exercise Comments    Row Name 12/11/15 1020 01/08/16 1119 02/05/16 1107 03/04/16 1420     Exercise Comments Pt is tolerating exercise well; will continue to monitor exercise progression. Reviewed METs and goals. Pt is tolerating exercise well; will continue to monitor exercise progression. Reviewed METs and goals. Pt is tolerating exercise well; will continue to monitor exercise progression. Reviewed METs and goals. Pt is tolerating exercise well; will continue to monitor exercise progression.       Discharge Exercise Prescription (Final Exercise Prescription Changes):     Exercise Prescription Changes - 03/04/16 1400      Response to Exercise   Blood Pressure (Admit) 124/78   Blood Pressure (Exercise)  128/82   Blood Pressure (Exit) 132/74   Heart Rate (Admit) 93 bpm   Heart Rate (Exercise) 114 bpm   Heart Rate (Exit) 63 bpm   Rating of Perceived Exertion (Exercise) 12   Symptoms none   Comments Reviewed HEP on 01/04/16   Duration Progress to 30 minutes of continuous aerobic without signs/symptoms of physical distress   Intensity THRR unchanged     Progression   Average METs 3.1     Resistance Training   Training Prescription Yes   Weight 4lbs   Reps 10-12     Recumbant Bike   Level 3.5   Minutes 10   METs 2.8     NuStep   Level 4   Minutes 10   METs 3.1     Track   Laps 14   Minutes 10   METs 3.43     Home Exercise Plan   Plans to continue exercise at Home  Reviewed on 01/04/16 see progress note   Frequency Add 2 additional days to program exercise sessions.      Nutrition:  Target Goals: Understanding of nutrition guidelines, daily intake of sodium <1531m, cholesterol <2054m calories 30% from fat and 7% or less from saturated fats, daily to have 5 or more servings of fruits and vegetables.  Biometrics:     Pre Biometrics - 11/29/15 1224      Pre Biometrics   Flexibility 7.5 in   Single Leg Stand 3.68 seconds       Nutrition Therapy Plan and Nutrition Goals:     Nutrition Therapy & Goals - 11/29/15 1416      Nutrition Therapy   Diet Carb Modified, Therapeutic Lifestyle Changes     Personal Nutrition Goals   Personal Goal #1 1-2 lb wt loss/week to a wt loss goal of 6-24 lb at [facility] and counsel regarding individualized specific dietary modifications aiming towards targeted core components such as weight, hypertension, lipid management, diabetes, heart failure and other comorbidities.   Expected Outcomes Short Term Goal: Understand basic principles of dietary content, such as calories, fat, sodium, cholesterol and nutrients.;Long Term Goal: Adherence to prescribed  nutrition plan.      Nutrition Discharge: Nutrition Scores:     Nutrition Assessments - 11/29/15 1415      MEDFICTS Scores   Pre Score 12      Nutrition Goals Re-Evaluation:   Psychosocial: Target Goals: Acknowledge presence or absence of depression, maximize coping skills, provide  positive support system. Participant is able to verbalize types and ability to use techniques and skills needed for reducing stress and depression.  Initial Review & Psychosocial Screening:     Initial Psych Review & Screening - 11/29/15 1515      Initial Review   Current issues with Current Anxiety/Panic     Family Dynamics   Good Support System? Yes   Comments Brief initial review, pt with some anxiety related to participating in cardiac rehab. Offered support and encouragement.     Barriers   Psychosocial barriers to participate in program The patient should benefit from training in stress management and relaxation.     Screening Interventions   Interventions Encouraged to exercise      Quality of Life Scores:     Quality of Life - 11/29/15 1224      Quality of Life Scores   Health/Function Pre 23.92 %   Socioeconomic Pre 21 %   Psych/Spiritual Pre 24.2 %   Family Pre 28.5 %   GLOBAL Pre 23.92 %      PHQ-9: Recent Review Flowsheet Data    Depression screen [facility] 2/9 12/03/2015 11/29/2015 10/15/2015 06/22/2015 06/08/2015   Decreased Interest 0 0 1 0 0   Down, Depressed, Hopeless 0 0 1 0 0   PHQ - 2 Score 0 0 2 0 0   Altered sleeping - - 0 - -   Tired, decreased energy - - 0 - -   Change in appetite - - 3 - -   Feeling bad or failure about yourself  - - 0 - -   Trouble concentrating - - 1 - -   Moving slowly or fidgety/restless - - 0 - -   Suicidal thoughts - - 0 - -   PHQ-9 Score - - 6 - -   Difficult doing work/chores - - Not difficult at all - -      Psychosocial Evaluation and Intervention:     Psychosocial Evaluation - 03/06/16 1514      Psychosocial Evaluation &  Interventions   Interventions Encouraged to exercise with the program and follow exercise prescription;Stress management education;Relaxation education   Comments No psychosocial needs identified, no further interventions needed   Continued Psychosocial Services Needed No      Psychosocial Re-Evaluation:     Psychosocial Re-Evaluation    Row Name 12/13/15 1359 01/10/16 1421 02/07/16 1614 03/06/16 1514       Psychosocial Re-Evaluation   Interventions Encouraged to attend Cardiac Rehabilitation for the exercise;Relaxation education;Stress management education Encouraged to attend Cardiac Rehabilitation for the exercise;Stress management education;Relaxation education Encouraged to attend Cardiac Rehabilitation for the exercise;Stress management education;Relaxation education Relaxation education;Stress management education;Encouraged to attend Cardiac Rehabilitation for the exercise    Comments  -  - Pt with supported family. Positve healthy outlook.  Interacts with fellow participants. Pt with supported family. Positve healthy outlook.  Interacts with fellow participants.    Continued Psychosocial Services Needed  - No No  -       Vocational Rehabilitation: Provide vocational rehab assistance to qualifying candidates.   Vocational Rehab Evaluation & Intervention:     Vocational Rehab - 11/29/15 1546      Initial Vocational Rehab Evaluation & Intervention   Assessment shows need for Vocational Rehabilitation No  Pt does not plan to return to work. Pt is retired.       Education: Education Goals: Education classes will be provided on a weekly basis, covering required topics. Participant will  state understanding/return demonstration of topics presented.  Learning Barriers/Preferences:     Learning Barriers/Preferences - 11/29/15 6578      Learning Barriers/Preferences   Learning Barriers Sight   Learning Preferences Group Instruction;Individual Instruction;Skilled  Demonstration;Verbal Instruction;Audio      Education Topics: Count Your Pulse:  -Group instruction provided by verbal instruction, demonstration, patient participation and written materials to support subject.  Instructors address importance of being able to find your pulse and how to count your pulse when at home without a heart monitor.  Patients get hands on experience counting their pulse with staff help and individually. Flowsheet Row CARDIAC REHAB PHASE II EXERCISE from 02/29/2016 in [place]  Date  12/07/15  Educator  Andi Hence, Rn  Instruction Review Code  2- meets goals/outcomes      Heart Attack, Angina, and Risk Factor Modification:  -Group instruction provided by verbal instruction, video, and written materials to support subject.  Instructors address signs and symptoms of angina and heart attacks.    Also discuss risk factors for heart disease and how to make changes to improve heart health risk factors. Flowsheet Row CARDIAC REHAB PHASE II EXERCISE from 02/29/2016 in [place]  Date  01/04/16 [hypertension]  Educator  Andi Hence, RN  Instruction Review Code  2- meets goals/outcomes      Functional Fitness:  -Group instruction provided by verbal instruction, demonstration, patient participation, and written materials to support subject.  Instructors address safety measures for doing things around the house.  Discuss how to get up and down off the floor, how to pick things up properly, how to safely get out of a chair without assistance, and balance training. Flowsheet Row CARDIAC REHAB PHASE II EXERCISE from 02/29/2016 in [place]  Date  12/14/15  Instruction Review Code  2- meets goals/outcomes      Meditation and Mindfulness:  -Group instruction provided by verbal instruction, patient participation, and written materials to support subject.  Instructor addresses  importance of mindfulness and meditation practice to help reduce stress and improve awareness.  Instructor also leads participants through a meditation exercise.  Flowsheet Row CARDIAC REHAB PHASE II EXERCISE from 02/29/2016 in [place]  Date  01/30/16  Instruction Review Code  2- meets goals/outcomes      Stretching for Flexibility and Mobility:  -Group instruction provided by verbal instruction, patient participation, and written materials to support subject.  Instructors lead participants through series of stretches that are designed to increase flexibility thus improving mobility.  These stretches are additional exercise for major muscle groups that are typically performed during regular warm up and cool down. Flowsheet Row CARDIAC REHAB PHASE II EXERCISE from 02/29/2016 in [place]  Date  02/29/16  Educator  Seward Carol  Instruction Review Code  2- meets goals/outcomes      Hands Only CPR Anytime:  -Group instruction provided by verbal instruction, video, patient participation and written materials to support subject.  Instructors co-teach with [facility] video for hands only CPR.  Participants get hands on experience with mannequins. Flowsheet Row CARDIAC REHAB PHASE II EXERCISE from 02/29/2016 in [place]  Date  01/16/16  Instruction Review Code  2- meets goals/outcomes      Nutrition I class: Heart Healthy Eating:  -Group instruction provided by PowerPoint slides, verbal discussion, and written materials to support subject matter. The instructor gives an explanation  and review of the Therapeutic Lifestyle Changes diet recommendations, which includes a discussion on lipid goals, dietary fat, sodium, fiber, plant stanol/sterol esters, sugar, and the components of a well-balanced, healthy diet. Flowsheet Row CARDIAC REHAB PHASE II EXERCISE from 02/29/2016 in [place]  Date  12/11/15  Educator  RD  Instruction Review Code  2- meets goals/outcomes      Nutrition II class: Lifestyle Skills:  -Group instruction provided by PowerPoint slides, verbal discussion, and written materials to support subject matter. The instructor gives an explanation and review of label reading, grocery shopping for heart health, heart healthy recipe modifications, and ways to make healthier choices when eating out. Flowsheet Row CARDIAC REHAB PHASE II EXERCISE from 02/29/2016 in [place]  Date  12/18/15  Educator  RD  Instruction Review Code  2- meets goals/outcomes      Diabetes Question & Answer:  -Group instruction provided by PowerPoint slides, verbal discussion, and written materials to support subject matter. The instructor gives an explanation and review of diabetes co-morbidities, pre- and post-prandial blood glucose goals, pre-exercise blood glucose goals, signs, symptoms, and treatment of hypoglycemia and hyperglycemia, and foot care basics. Flowsheet Row CARDIAC REHAB PHASE II EXERCISE from 02/29/2016 in [place]  Date  01/18/16  Educator  RD  Instruction Review Code  2- meets goals/outcomes      Diabetes Blitz:  -Group instruction provided by PowerPoint slides, verbal discussion, and written materials to support subject matter. The instructor gives an explanation and review of the physiology behind type 1 and type 2 diabetes, diabetes medications and rational behind using different medications, pre- and post-prandial blood glucose recommendations and Hemoglobin A1c goals, diabetes diet, and exercise including blood glucose guidelines for exercising safely.    Portion Distortion:  -Group instruction provided by PowerPoint slides, verbal discussion, written materials, and food models to support subject matter. The instructor gives an explanation of serving size versus portion size,  changes in portions sizes over the last 20 years, and what consists of a serving from each food group. Flowsheet Row CARDIAC REHAB PHASE II EXERCISE from 02/29/2016 in [place]  Date  02/06/16  Educator  RD  Instruction Review Code  2- meets goals/outcomes      Stress Management:  -Group instruction provided by verbal instruction, video, and written materials to support subject matter.  Instructors review role of stress in heart disease and how to cope with stress positively.   Flowsheet Row CARDIAC REHAB PHASE II EXERCISE from 02/29/2016 in [place]  Date  12/19/15      Exercising on Your Own:  -Group instruction provided by verbal instruction, power point, and written materials to support subject.  Instructors discuss benefits of exercise, components of exercise, frequency and intensity of exercise, and end points for exercise.  Also discuss use of nitroglycerin and activating EMS.  Review options of places to exercise outside of rehab.  Review guidelines for sex with heart disease. Flowsheet Row CARDIAC REHAB PHASE II EXERCISE from 02/29/2016 in [place]  Date  12/21/15  Instruction Review Code  2- meets goals/outcomes      Cardiac Drugs I:  -Group instruction provided by verbal instruction and written materials to support subject.  Instructor reviews cardiac drug classes: antiplatelets, anticoagulants, beta blockers, and statins.  Instructor discusses reasons, side effects, and lifestyle considerations for each drug class.  Cardiac Drugs II:  -Group instruction provided by verbal instruction and written materials to support subject.  Instructor reviews cardiac drug classes: angiotensin converting enzyme inhibitors (ACE-I), angiotensin II receptor blockers (ARBs), nitrates, and calcium channel blockers.  Instructor discusses reasons, side effects, and lifestyle considerations for each  drug class. Flowsheet Row CARDIAC REHAB PHASE II EXERCISE from 02/29/2016 in [place]  Date  12/12/15  Instruction Review Code  2- meets goals/outcomes      Anatomy and Physiology of the Circulatory System:  -Group instruction provided by verbal instruction, video, and written materials to support subject.  Reviews functional anatomy of heart, how it relates to various diagnoses, and what role the heart plays in the overall system. Flowsheet Row CARDIAC REHAB PHASE II EXERCISE from 02/29/2016 in [place]  Date  12/05/15  Instruction Review Code  2- meets goals/outcomes      Knowledge Questionnaire Score:     Knowledge Questionnaire Score - 11/29/15 1217      Knowledge Questionnaire Score   Pre Score 19/24      Core Components/Risk Factors/Patient Goals at Admission:     Personal Goals and Risk Factors at Admission - 11/29/15 1019      Core Components/Risk Factors/Patient Goals on Admission   Expected Outcomes weight loss and increase overall functional capacity       Core Components/Risk Factors/Patient Goals Review:      Goals and Risk Factor Review    Row Name 11/29/15 0830 11/29/15 0843 01/04/16 1055 02/05/16 1108 03/04/16 1420     Core Components/Risk Factors/Patient Goals Review   Personal Goals Review Weight Management/Obesity;Other  - Increase Strength and Stamina Increase Strength and Stamina;Weight Management/Obesity  -   Review Lose weigt, 20 lbs in three months and 60lbs long term.  Be able to walk longer distance w/o SOB Lose weigt, 20 lbs in three months and 60lbs long term.  Be able to walk longer distance w/o SOB, increase leg strength and balance Pt balance is improving and pt has increased laps around walking track in cardiac rehab. Pt enjoys the program Pt stated " weight is down and going in the right direction" and " increased in stamina and energy levels" Pt weight is down ~2-3lbs Pt is  steady maintaining weightloss. Pt started at 84.7kg and most recent check in weight at 80.1kg   Expected Outcomes Weight loss, increase stamina and overall functional capacity  Weight loss, increase stamina, balance, leg strength and overall functional capacity  Pt will continue to improve cardiovascular fitness, strength, endurance and balance.  Pt will continue to improve cardiovascular fitness, strength, endurance and continue with weightloss Pt will continue with maintaining or losing weight to reach long term goal.      Core Components/Risk Factors/Patient Goals at Discharge (Final Review):      Goals and Risk Factor Review - 03/04/16 1420      Core Components/Risk Factors/Patient Goals Review   Review Pt is steady maintaining weightloss. Pt started at 84.7kg and most recent check in weight at 80.1kg   Expected Outcomes Pt will continue with maintaining or losing weight to reach long term goal.      ITP Comments:     ITP Comments    Row Name 11/29/15 0836 11/29/15 1501 02/01/16 1233       ITP Comments Dr. Alfredia Ferguson -  Dr. Fransico Him, Medical Director for Cardiac Rehab attended CPR education class.  outcomes and goals met  Comments: Pt is making expected progress toward personal goals after completing  34 sessions. Pt has upcoming graduation on Monday.  Pt plans to continue with the maintenance program.Psychosocial Assessment  Pt with good support at home. Pt interacts positively with healthy coping skills. Generally feels good about her outlook.  Recommend continued exercise and life style modification education including  stress management and relaxation techniques to decrease cardiac risk profile. Carlette Elana Alm, BSN;

## 2016-03-06 NOTE — Telephone Encounter (Signed)
Understood. It was for other reasons than location. I will await Dr. Ansel Bong response and then will get patient scheduled. Thanks!

## 2016-03-06 NOTE — Telephone Encounter (Signed)
Okay, but make sure that she knows that Dr. Yong Channel will be transferring here in the fall if location is the reason for the transfer.

## 2016-03-06 NOTE — Telephone Encounter (Signed)
No problems here. Thanks for reaching out [place].

## 2016-03-06 NOTE — Telephone Encounter (Signed)
Patient called to transfer care from Dr. Yong Channel at [address] [facility] to Dr. Juleen China here at [place]. Out of courtesy for Dr. Yong Channel I will await approval from both of you before getting patient scheduled to establish care and annual wellness visit as well.   Thank you.

## 2016-03-07 ENCOUNTER — Encounter (HOSPITAL_COMMUNITY)
Admission: RE | Admit: 2016-03-07 | Discharge: 2016-03-07 | Disposition: A | Discharge status: Home / Self Care | Payer: PPO | Source: Ambulatory Visit | Attending: Cardiovascular Disease | Admitting: Cardiovascular Disease

## 2016-03-07 DIAGNOSIS — I213 ST elevation (STEMI) myocardial infarction of unspecified site: Secondary | ICD-10-CM | POA: Diagnosis not present

## 2016-03-07 DIAGNOSIS — I2510 Atherosclerotic heart disease of native coronary artery without angina pectoris: Secondary | ICD-10-CM | POA: Diagnosis not present

## 2016-03-07 DIAGNOSIS — E785 Hyperlipidemia, unspecified: Secondary | ICD-10-CM | POA: Diagnosis not present

## 2016-03-07 DIAGNOSIS — Z955 Presence of coronary angioplasty implant and graft: Secondary | ICD-10-CM

## 2016-03-07 DIAGNOSIS — I2119 ST elevation (STEMI) myocardial infarction involving other coronary artery of inferior wall: Secondary | ICD-10-CM | POA: Diagnosis not present

## 2016-03-07 DIAGNOSIS — I2111 ST elevation (STEMI) myocardial infarction involving right coronary artery: Secondary | ICD-10-CM

## 2016-03-07 DIAGNOSIS — Z9861 Coronary angioplasty status: Secondary | ICD-10-CM | POA: Insufficient documentation

## 2016-03-07 DIAGNOSIS — I10 Essential (primary) hypertension: Secondary | ICD-10-CM | POA: Insufficient documentation

## 2016-03-10 ENCOUNTER — Encounter: Payer: Self-pay | Admitting: Family Medicine

## 2016-03-10 ENCOUNTER — Ambulatory Visit (INDEPENDENT_AMBULATORY_CARE_PROVIDER_SITE_OTHER): Payer: PPO | Admitting: Family Medicine

## 2016-03-10 ENCOUNTER — Encounter (HOSPITAL_COMMUNITY)
Admission: RE | Admit: 2016-03-10 | Discharge: 2016-03-10 | Disposition: A | Discharge status: Home / Self Care | Payer: PPO | Source: Ambulatory Visit | Attending: Cardiovascular Disease | Admitting: Cardiovascular Disease

## 2016-03-10 ENCOUNTER — Other Ambulatory Visit: Payer: Self-pay | Admitting: Surgical

## 2016-03-10 VITALS — BP 148/86 | HR 67 | Temp 97.6°F | Ht 63.0 in | Wt 177.2 lb

## 2016-03-10 VITALS — Ht 62.75 in | Wt 175.7 lb

## 2016-03-10 DIAGNOSIS — Z1231 Encounter for screening mammogram for malignant neoplasm of breast: Secondary | ICD-10-CM

## 2016-03-10 DIAGNOSIS — M5442 Lumbago with sciatica, left side: Secondary | ICD-10-CM

## 2016-03-10 DIAGNOSIS — Z955 Presence of coronary angioplasty implant and graft: Secondary | ICD-10-CM

## 2016-03-10 DIAGNOSIS — Z87891 Personal history of nicotine dependence: Secondary | ICD-10-CM

## 2016-03-10 DIAGNOSIS — M8580 Other specified disorders of bone density and structure, unspecified site: Secondary | ICD-10-CM

## 2016-03-10 DIAGNOSIS — I25119 Atherosclerotic heart disease of native coronary artery with unspecified angina pectoris: Secondary | ICD-10-CM | POA: Insufficient documentation

## 2016-03-10 DIAGNOSIS — I2510 Atherosclerotic heart disease of native coronary artery without angina pectoris: Secondary | ICD-10-CM | POA: Insufficient documentation

## 2016-03-10 DIAGNOSIS — I2111 ST elevation (STEMI) myocardial infarction involving right coronary artery: Secondary | ICD-10-CM

## 2016-03-10 DIAGNOSIS — I2119 ST elevation (STEMI) myocardial infarction involving other coronary artery of inferior wall: Secondary | ICD-10-CM | POA: Diagnosis not present

## 2016-03-10 DIAGNOSIS — M25511 Pain in right shoulder: Secondary | ICD-10-CM

## 2016-03-10 DIAGNOSIS — Z1239 Encounter for other screening for malignant neoplasm of breast: Secondary | ICD-10-CM

## 2016-03-10 DIAGNOSIS — G8929 Other chronic pain: Secondary | ICD-10-CM

## 2016-03-10 NOTE — Progress Notes (Signed)
Pre visit review using our clinic review tool, if applicable. No additional management support is needed unless otherwise documented below in the visit note. 

## 2016-03-10 NOTE — Progress Notes (Signed)
Cardiology Office Note:    Date:  03/11/2016   ID:  Tricia [name], DOB 1945-09-22, MRN [id number]  PCP:  Tricia Deutscher, DO  Cardiologist:  Dr. Sherren [name]   Electrophysiologist:  n/a  Referring MD: Tricia Olp, MD   Chief Complaint  Patient presents with  . Follow-up    CAD    History of Present Illness:    Tricia [name] is a 71 y.o. female with a hx of CAD s/p remote BMS to the LAD, HTN, HL, DM2, morbid obesity.  She was admitted in 8/17 with an inferior STEMI c/b VF arrest requiring CPR and defibrillation.  The RCA was occluded and tx with a DES.  She also had severe in-stent restenosis in the LAD tx with staged PCI with DES.  EF was preserved.  She was intol of Brilinta due to shortness of breath.  She suffered multiple rib fractures due to CPR.  She was last seen by Dr. Sherren [name] in 11/17.  She returns for Cardiology follow up.    She is here alone.  She continues to go to cardiac rehabilitation.  She feels good when she exercises.  However, over the last few months she has had 3 episodes of chest pain for which she has taken NTG with relief.  She feels like it is similar to her angina that she had many years ago prior to PCI. However, the pain many years ago was much worse.  She denies assoc shortness of breath, nausea, diaphoresis.  She denies significant dyspnea on exertion, orthopnea, PND, edema.  She denies syncope.  She denies pleuritic chest pain, dysphagia, odynophagia, melena, hematochezia.  She has noted some occasional back pain.  She denies any severe symptoms or ripping sensation.  She has had a few nights where her Fit Bit recorded HRs in the 130s.  She had palpitations with this.  She has not had any recurrence in several weeks.   Prior CV studies that were reviewed today include:    LHC 10/03/15 LAD prox and mid stent 85% ISR, dist stent ok with 25% ISR LCx prox 40%, dist 60% RCA ostial stent ok, prox stent ok, mid 65% and 20% PCI Angiosculpt scoring  balloon and 3 x 32 mm Synergy DES to prox and mid LAD  Echo 10/02/15 EF 60-65%, normal wall motion, mild AI, mild RAE, mild to moderate TR  LHC 10/01/15 RCA ostial 80% and proximal 100%, mid 65% LCx prox 40% LAD prox stent 40% ISR, 85% ISR, dist stent ok with 25% ISR EF 55-65% PCI: 2.75 x 32 mm Synergy DES to ost/prox RCA  Past Medical History:  Diagnosis Date  . Adenomatous polyp of colon 02/2004  . Anemia   . Anxiety   . Arthritis   . B12 deficiency   . CAD (coronary artery disease)    a. s/p remote BMS to LAD;  b. 09/2015 Inf STEMI/VF Arrest: LM nl, LAD 85p (staged PCI 2 days later Tricia/ 3.0x32 Synergy DES), 40p/m, 25d, LCX 59m RCA 80ost/100p (2.75x32 Synergy DES), 657mEF 55-65%.  . Depression   . DEPRESSION 09/22/2006   Qualifier: Diagnosis of  By: WiJimmye [name], Tricia [name] . Dermatophytosis of groin and perianal area   . Diet Controlled Diabetes Mellitus   . Diverticulosis   . H/O echocardiogram    a. 09/2015 Echo: EF 60-65%, no rwma, mild AI, mildly dil RA, mild to mod TR.  . Marland Kitchenyperlipidemia   . Hypertensive heart disease   .  Low back pain    l5 disc  . Morbid obesity ([place])   . Obesity   . Osteoporosis   . Sleep apnea   . Sleep apnea 10/03/2009   Resolved after gastric bypass    . TOBACCO ABUSE 10/05/2009   Qualifier: Diagnosis of  By: Tricia Breed, MD, Kandyce Rud   . Ventricular fibrillation ([place]) 10/01/2015   a. In setting of inferior STEMI.    Past Surgical History:  Procedure Laterality Date  . ABDOMINAL HYSTERECTOMY    . APPENDECTOMY    . BARIATRIC SURGERY    . CARDIAC CATHETERIZATION N/A 10/01/2015   Procedure: Left Heart Cath and Coronary Angiography;  Surgeon: Leonie Man, MD;  Location: [place] [facility];  Service: Cardiovascular;  Laterality: N/A;  . CARDIAC CATHETERIZATION N/A 10/01/2015   Procedure: Coronary Stent Intervention;  Surgeon: Leonie Man, MD;  Location: [place] [facility];  Service: Cardiovascular;  Laterality: N/A;  . CARDIAC  CATHETERIZATION N/A 10/03/2015   Procedure: Coronary Stent Intervention;  Surgeon: Tricia Sine, MD;  Location: [place] [facility];  Service: Cardiovascular;  Laterality: N/A;  . CARDIAC CATHETERIZATION N/A 10/03/2015   Procedure: Coronary/Graft Angiography;  Surgeon: Tricia Sine, MD;  Location: [place] [facility];  Service: Cardiovascular;  Laterality: N/A;  . CARPAL TUNNEL RELEASE     bilateral  . CERVICAL DISC SURGERY    . CHOLECYSTECTOMY    . CORONARY ANGIOPLASTY  2002   2 times  . HERNIA REPAIR    . LUMBAR LAMINECTOMY     l3-4  . NEPHRECTOMY     partial  . TONSILLECTOMY    . TOTAL KNEE ARTHROPLASTY     L  . TUBAL LIGATION    . ulnar nerve release     and thumb surgery    Current Medications: Current Meds  Medication Sig  . aspirin EC 81 MG tablet Take 81 mg by mouth daily.  Marland Kitchen atorvastatin (LIPITOR) 80 MG tablet Take 1 tablet (80 mg total) by mouth daily.  . Biotin 10000 MCG TABS Take 10,000 mcg by mouth daily. Dissolvable tablets  . blood glucose meter kit and supplies KIT Dispense based on patient and insurance preference. Use to test daily. (FOR ICD-9 250.00, 250.01). Dx Code:E11.22 N18.1  . carvedilol (COREG) 6.25 MG tablet Take 1 tablet (6.25 mg total) by mouth 2 (two) times daily with a meal.  . Cholecalciferol (VITAMIN D3) 2000 units CHEW Chew 2,000 Units by mouth daily.  . clopidogrel (PLAVIX) 75 MG tablet Take 1 tablet (75 mg total) by mouth daily.  . diazepam (VALIUM) 2 MG tablet Take 1 tablet (2 mg total) by mouth every 6 (six) hours as needed for anxiety.  Marland Kitchen glucose blood ([employer] ADVANCED GLUCOSE TEST) test strip USE TO TEST 1 TIME DAILY AS DIRECTED E11.22, N18.1  . glucose blood test strip Use once a day and as needed to test blood sugars  . HYDROcodone-acetaminophen (NORCO/VICODIN) 5-325 MG tablet Take 1 tablet by mouth every 6 (six) hours as needed for moderate pain.  . Multiple Vitamin (MULTIVITAMIN WITH MINERALS) TABS tablet Take 1 tablet by mouth daily.  Women's One a Day  . nitroGLYCERIN (NITROLINGUAL) 0.4 MG/SPRAY spray Place 1 spray under the tongue every 5 (five) minutes as needed for chest pain.  . pantoprazole (PROTONIX) 40 MG tablet Take 1 tablet (40 mg total) by mouth daily.  Marland Kitchen tiZANidine (ZANAFLEX) 2 MG tablet Take by mouth every 8 (eight) hours as needed for muscle spasms.  Marland Kitchen  valsartan (DIOVAN) 320 MG tablet TAKE 1 TABLET (320 MG TOTAL) BY MOUTH DAILY.  . VENTOLIN HFA 108 (90 BASE) MCG/ACT inhaler INHALE TWO PUFFS BY MOUTH EVERY SIX HOURS as needed for wheezing     Allergies:   Sulfa antibiotics   Social History   Social History  . Marital status: Married    Spouse name: N/A  . Number of children: N/A  . Years of education: N/A   Occupational History  . retired Unemployed   Social History Main Topics  . Smoking status: Former Smoker    Packs/day: 1.50    Years: 35.00    Types: Cigarettes    Quit date: 03/06/2008  . Smokeless tobacco: Never Used  . Alcohol use No  . Drug use: No  . Sexual activity: Yes   Other Topics Concern  . None   Social History Narrative   Widowed in 04/2012. 2 children. 3 grandkids.    Lives alone. Completely indendent.       Disabled/retired. Disabled-lifted computer paper      Hobbies: spend money-gamble     Family History:  The patient's family history includes Colitis in her mother; Heart disease in her father; Stomach cancer in her maternal uncle.   ROS:   Please see the history of present illness.    Review of Systems  Cardiovascular: Positive for chest pain.  Hematologic/Lymphatic: Bruises/bleeds easily.  Musculoskeletal: Positive for back pain and myalgias.   All other systems reviewed and are negative.   EKGs/Labs/Other Test Reviewed:    EKG:  EKG is  ordered today.  The ekg ordered today demonstrates sinus brady, HR 56, QTc 409 ms, no change from prior tracing.   Recent Labs: 07/27/2015: TSH 3.32 10/01/2015: Magnesium 1.9 10/08/2015: Hemoglobin 10.9; Platelets  202 11/29/2015: BUN 29; Creat 1.01; Potassium 4.7; Sodium 141 03/05/2016: ALT 19   Recent Lipid Panel    Component Value Date/Time   CHOL 125 03/05/2016 0948   TRIG 85 03/05/2016 0948   TRIG 122 12/11/2005 1028   HDL 45 03/05/2016 0948   CHOLHDL 2.8 03/05/2016 0948   CHOLHDL 4.3 10/01/2015 1530   VLDL 31 10/01/2015 1530   LDLCALC 63 03/05/2016 0948   LDLDIRECT 86.0 09/11/2015 1104     Physical Exam:    VS:  BP (!) 188/82   Pulse (!) 56   Ht _0  (1.575 m)   Wt 180 lb (81.6 kg)   BMI 32.92 kg/m     Wt Readings from Last 3 Encounters:  03/11/16 180 lb (81.6 kg)  03/10/16 177 lb 3.2 oz (80.4 kg)  12/10/15 184 lb 12.8 oz (83.8 kg)     Physical Exam  Constitutional: She is oriented to person, place, and time. She appears well-developed and well-nourished. No distress.  HENT:  Head: Normocephalic and atraumatic.  Eyes: No scleral icterus.  Neck: No JVD present.  Cardiovascular: Normal rate, regular rhythm and normal heart sounds.   No murmur heard. Pulmonary/Chest: Effort normal. She has no wheezes. She has no rales.  Abdominal: Soft. There is no tenderness.  Musculoskeletal: She exhibits no edema.  Neurological: She is alert and oriented to person, place, and time.  Skin: Skin is warm and dry.  Psychiatric: She has a normal mood and affect.    ASSESSMENT:    1. Coronary artery disease involving native coronary artery of native heart with angina pectoris ([place])   2. Essential hypertension   3. Hyperlipidemia, unspecified hyperlipidemia type   4. Palpitations  PLAN:    In order of problems listed above:  1. CAD - s/p inferior STEMI in 8/17 tx with DES to RCA and staged PCI for severe ISR in the LAD with a DES.  She has had a few episodes of chest pain c/Tricia angina that were NTG responsive.  Her symptoms have not been severe and she denies exertional symptoms.  Her BP is uncontrolled and I question if this is the reason for her symptoms.  Her ECG is unchanged.  Her  HR is too slow to advance her beta-blocker.   -  Continue ASA, Plavix, Lipitor, Coreg  -  Add Amlodipine 5 mg QD  -  Arrange Lexiscan Myoview  -  Close follow up in 2 weeks.  2. HTN - BP uncontrolled.  She is on max dose angiotensin receptor blocker and her HR is too slow to advance her beta-blocker.  I will add Amlodipine 5 mg QD.  She will follow up in the HTN clinic in 1 week.  If her BP is not at target, increase Amlodipine to 10 mg QD.   3. HL - LDL in 1/18 was 63.  Continue statin.  4. Palpitations - Etiology not clear.  She has not had any symptoms in a few weeks.  If they recur, she will call.  I would recommend an event monitor at that point.    Medication Adjustments/Labs and Tests Ordered: Current medicines are reviewed at length with the patient today.  Concerns regarding medicines are outlined above.  Medication changes, Labs and Tests ordered today are outlined in the Patient Instructions noted below. Patient Instructions  Medication Instructions:  Start Amlodipine 5 mg Once daily   Labwork: None today  Testing/Procedures: Schedule a [employer] Your physician has requested that you have a lexiscan myoview. For further information please visit [email or web address]. Please follow instruction sheet, as given.  Follow-Up: 1. Hypertension Clinic in 1 week at the [place] 2. Dr. Sherren [name] or Richardson Dopp, PA-C in 2 weeks. IF WITH PA THEN SAME DAY DR. Burt Knack IS IN THE OFFICE  Any Other Special Instructions Will Be Listed Below (If Applicable). Call us if you have more of those palpitations or increased heart rates.  If you need a refill on your cardiac medications before your next appointment, please call your pharmacy.   Signed, Richardson Dopp, PA-C  03/11/2016 10:49 AM    [facility] [address], [place], [place]  [address] Phone: [phone]; Fax: [phone]

## 2016-03-10 NOTE — Progress Notes (Signed)
Discharge Summary  Patient Details  Name: Tricia [name] MRN: [id number] Date of Birth: [birth year] Referring Provider:   Flowsheet Row CARDIAC REHAB PHASE II ORIENTATION from 11/29/2015 in [place]  Referring Provider  Sherren Mocha MD       Number of Visits: 36  Reason for Discharge:  Patient reached a stable level of exercise. Patient independent in their exercise.  Smoking History:  History  Smoking Status  . Former Smoker  . Packs/day: 1.50  . Years: 35.00  . Types: Cigarettes  . Quit date: 03/06/2008  Smokeless Tobacco  . Never Used    Diagnosis:  10/01/15 ST elevation myocardial infarction involving right coronary artery ([place])  10/03/15 Status post coronary artery stent placement LAD  ADL UCSD:   Initial Exercise Prescription:     Initial Exercise Prescription - 11/29/15 1200      Date of Initial Exercise RX and Referring Provider   Date 11/29/15   Referring Provider Sherren Mocha MD     Recumbant Bike   Level 1   Minutes 10   METs 2.5     NuStep   Level 1   Minutes 10   METs 1.7     Track   Laps 10   Minutes 10   METs 2.74     Prescription Details   Frequency (times per week) 3   Duration Progress to 45 minutes of aerobic exercise without signs/symptoms of physical distress     Intensity   THRR 40-80% of Max Heartrate 61-122   Ratings of Perceived Exertion 11-13   Perceived Dyspnea 0-4     Progression   Progression Continue to progress workloads to maintain intensity without signs/symptoms of physical distress.     Resistance Training   Training Prescription Yes   Weight 1lb   Reps 10-12      Discharge Exercise Prescription (Final Exercise Prescription Changes):     Exercise Prescription Changes - 03/10/16 1649      Response to Exercise   Blood Pressure (Admit) 148/82   Blood Pressure (Exercise) 160/80   Blood Pressure (Exit) 128/72   Heart Rate (Admit) 80 bpm   Heart Rate (Exercise) 105  bpm   Heart Rate (Exit) 72 bpm   Rating of Perceived Exertion (Exercise) 12   Symptoms none   Comments Reviewed HEP on 01/04/16   Duration Progress to 30 minutes of continuous aerobic without signs/symptoms of physical distress   Intensity THRR unchanged     Progression   Average METs 2.8     Resistance Training   Training Prescription Yes   Weight 4lbs   Reps 10-12     Recumbant Bike   Level 3.5   Minutes 10   METs 2.4     NuStep   Level 5   Minutes 10   METs 2.8     Track   Laps 14   Minutes 10   METs 3.43     Home Exercise Plan   Plans to continue exercise at Home  reviewed HEP on 01/04/16   Frequency Add 2 additional days to program exercise sessions.      Functional Capacity:     6 Minute Walk    Row Name 11/29/15 1247 03/19/16 1142 03/25/16 1647     6 Minute Walk   Phase Initial (P)  Discharge  -   Distance 1306 feet (P)  1600 feet 1600 feet   Distance % Change  -  - 22.51 %  Walk Time 6 minutes (P)  6 minutes 6 minutes   # of Rest Breaks 0 (P)  0 0   MPH 2.5 (P)  3.03 3.03   METS 2.7 (P)  3.3 3.3   RPE 12 (P)  10.5 10.5   Perceived Dyspnea  0 (P)  0  -   VO2 Peak 9.6 (P)  11.4 11.4   Symptoms No (P)  No No   Resting HR 66 bpm (P)  76 bpm 76 bpm   Resting BP 118/70 (P)  140/78  pt took pain pill this morning 140/78   Max Ex. HR 96 bpm (P)  87 bpm 87 bpm   Max Ex. BP 150/80 (P)  152/82 152/82   2 Minute Post BP 108/70 (P)  118/62 118/62   Row Name 03/25/16 1714         6 Minute Walk   Phase Discharge        Psychological, QOL, Others - Outcomes: PHQ 2/9: Depression screen [facility] 2/9 03/10/2016 12/03/2015 11/29/2015 10/15/2015 06/22/2015  Decreased Interest 0 0 0 1 0  Down, Depressed, Hopeless 0 0 0 1 0  PHQ - 2 Score 0 0 0 2 0  Altered sleeping - - - 0 -  Tired, decreased energy - - - 0 -  Change in appetite - - - 3 -  Feeling bad or failure about yourself  - - - 0 -  Trouble concentrating - - - 1 -  Moving slowly or fidgety/restless - -  - 0 -  Suicidal thoughts - - - 0 -  PHQ-9 Score - - - 6 -  Difficult doing work/chores - - - Not difficult at all -    Quality of Life:     Quality of Life - 03/10/16 1125      Quality of Life Scores   Health/Function Pre 23.92 %   Health/Function Post 24.87 %   Health/Function % Change 3.97 %   Socioeconomic Pre 21 %   Socioeconomic Post 23.71 %   Socioeconomic % Change  12.9 %   Psych/Spiritual Pre 24.2 %   Psych/Spiritual Post 25.71 %   Psych/Spiritual % Change 6.24 %   Family Pre 28.5 %   Family Post 25 %   Family % Change -12.28 %   GLOBAL Pre 23.92 %   GLOBAL Post 24.82 %   GLOBAL % Change 3.76 %      Personal Goals: Goals established at orientation with interventions provided to work toward goal.     Personal Goals and Risk Factors at Admission - 11/29/15 1019      Core Components/Risk Factors/Patient Goals on Admission   Expected Outcomes weight loss and increase overall functional capacity        Personal Goals Discharge:     Goals and Risk Factor Review    Row Name 11/29/15 0830 11/29/15 0843 01/04/16 1055 02/05/16 1108 03/04/16 1420     Core Components/Risk Factors/Patient Goals Review   Personal Goals Review Weight Management/Obesity;Other  - Increase Strength and Stamina Increase Strength and Stamina;Weight Management/Obesity  -   Review Lose weigt, 20 lbs in three months and 60lbs long term.  Be able to walk longer distance w/o SOB Lose weigt, 20 lbs in three months and 60lbs long term.  Be able to walk longer distance w/o SOB, increase leg strength and balance Pt balance is improving and pt has increased laps around walking track in cardiac rehab. Pt enjoys the program Pt stated "  weight is down and going in the right direction" and " increased in stamina and energy levels" Pt weight is down ~2-3lbs Pt is steady maintaining weightloss. Pt started at 84.7kg and most recent check in weight at 80.1kg   Expected Outcomes Weight loss, increase stamina and  overall functional capacity  Weight loss, increase stamina, balance, leg strength and overall functional capacity  Pt will continue to improve cardiovascular fitness, strength, endurance and balance.  Pt will continue to improve cardiovascular fitness, strength, endurance and continue with weightloss Pt will continue with maintaining or losing weight to reach long term goal.   Row Name 03/25/16 1654 03/25/16 1714 03/27/16 1514         Core Components/Risk Factors/Patient Goals Review   Personal Goals Review  - Other;Weight Management/Obesity Weight Management/Obesity     Review Pt has lost and maintained weightloss. Pt has improved in aerobic and functional capacity  - Pt has lost 9.8 lb while in Cardiac Rehab.     Expected Outcomes Pt will continue to progress in activity levels and improve in cardiorespiratory fitness.  - Continue Heart Healthy and Lifestyle Changes to promote desired wt losss.         Nutrition & Weight - Outcomes:     Pre Biometrics - 11/29/15 1224      Pre Biometrics   Flexibility 7.5 in   Single Leg Stand 3.68 seconds         Post Biometrics - 03/19/16 1141       Post  Biometrics   Height 5' 2.75" (1.594 m)   Weight 175 lb 11.3 oz (79.7 kg)   Waist Circumference 37 inches   Hip Circumference 45.75 inches   Waist to Hip Ratio 0.81 %   BMI (Calculated) 31.4   Triceps Skinfold 32 mm   % Body Fat 42.6 %   Grip Strength 29 kg   Flexibility 12 in   Single Leg Stand 4.09 seconds      Nutrition:     Nutrition Therapy & Goals - 11/29/15 1416      Nutrition Therapy   Diet Carb Modified, Therapeutic Lifestyle Changes     Personal Nutrition Goals   Nutrition Goal 1-2 lb wt loss/week to a wt loss goal of 6-24 lb at graduation from [facility], educate and counsel regarding individualized specific dietary modifications aiming towards targeted core components such as weight, hypertension, lipid management,  diabetes, heart failure and other comorbidities.   Expected Outcomes Short Term Goal: Understand basic principles of dietary content, such as calories, fat, sodium, cholesterol and nutrients.;Long Term Goal: Adherence to prescribed nutrition plan.      Nutrition Discharge:     Nutrition Assessments - 11/29/15 1415      MEDFICTS Scores   Pre Score 12      Education Questionnaire Score:     Knowledge Questionnaire Score - 03/10/16 1115      Knowledge Questionnaire Score   Post Score 21/24      Goals reviewed with patient. Pt graduated from cardiac rehab program today with completion of 36 exercise sessions in Phase II. Pt maintained good attendance and progressed nicely during his participation in rehab as evidenced by increased MET level.   Medication list reconciled. Repeat  PHQ score-0. Pt with improved dispostion and outlook on life. Pt has made significant lifestyle changes and should be commended for her success. Pt feels she has achieved some of her goals during  cardiac rehab.   Pt plans to continue exercise at the [facility] center walking inside and on the trail from her home.  Pt will  then when the weather turns warmer she will switch to [employer] for swimming and exercising. Cherre Huger, BSN

## 2016-03-10 NOTE — Patient Instructions (Signed)
It was so nice to meet you!   Follow up in 3-6 months or sooner if needed.

## 2016-03-10 NOTE — Progress Notes (Addendum)
Tricia [name] is a 71 y.o. female is here to [place].   History of Present Illness:    1. Chronic left-sided low back pain with left-sided sciatica: Stable. Finishing physical therapy. Uses Norco prn. Followed by Neurosurgery. Declined surgery. Some sciatica. Does not need cane or other device.    2. Former smoker: Has not had low dose CT. Eligible. Interested if covered by insurance.    3. Osteopenia, unspecified location: Due for DEXA. Interested if covered by insurance.   4.      Right shoulder pain, after fall. Followed by Raliegh Ip. Considering MRI. Loosing mobility.  Health Maintenance Due  Topic Date Due  . OPHTHALMOLOGY EXAM  09/02/1957  . MAMMOGRAM  05/12/2012    PMHx, SurgHx, SocialHx, Medications, and Allergies were reviewed in the Visit Navigator and updated as appropriate.    Past Medical History:  Diagnosis Date  . Adenomatous polyp of colon 02/2004  . Anemia   . Anxiety   . Arthritis   . B12 deficiency   . CAD (coronary artery disease)    a. s/p remote BMS to LAD;  b. 09/2015 Inf STEMI/VF Arrest: LM nl, LAD 85p (staged PCI 2 days later w/ 3.0x32 Synergy DES), 40p/m, 25d, LCX 15m RCA 80ost/100p (2.75x32 Synergy DES), 617mEF 55-65%.  . Depression   . DEPRESSION 09/22/2006   Qualifier: Diagnosis of  By: WiJimmye NormanLPN, BoWinfield Cunas . Dermatophytosis of groin and perianal area   . Diet Controlled Diabetes Mellitus   . Diverticulosis   . H/O echocardiogram    a. 09/2015 Echo: EF 60-65%, no rwma, mild AI, mildly dil RA, mild to mod TR.  . Marland Kitchenyperlipidemia   . Hypertensive heart disease   . Low back pain    l5 disc  . Morbid obesity ([place]  . Obesity   . Osteoporosis   . Sleep apnea   . Sleep apnea 10/03/2009   Resolved after gastric bypass    . TOBACCO ABUSE 10/05/2009   Qualifier: Diagnosis of  By: CrStanford BreedMD, FAKandyce Rud . Ventricular fibrillation (HCGasport08/28/2017   a. In setting of inferior STEMI.    Past Surgical History:    Procedure Laterality Date  . ABDOMINAL HYSTERECTOMY    . APPENDECTOMY    . BARIATRIC SURGERY    . CARDIAC CATHETERIZATION N/A 10/01/2015   Procedure: Left Heart Cath and Coronary Angiography;  Surgeon: DaLeonie ManMD;  [facility];  Service: Cardiovascular;  Laterality: N/A;  . CARDIAC CATHETERIZATION N/A 10/01/2015   Procedure: Coronary Stent Intervention;  Surgeon: DaLeonie ManMD;  [facility];  Service: Cardiovascular;  Laterality: N/A;  . CARDIAC CATHETERIZATION N/A 10/03/2015   Procedure: Coronary Stent Intervention;  Surgeon: ThTroy SineMD;  [facility];  Service: Cardiovascular;  Laterality: N/A;  . CARDIAC CATHETERIZATION N/A 10/03/2015   Procedure: Coronary/Graft Angiography;  Surgeon: ThTroy SineMD;  [facility];  Service: Cardiovascular;  Laterality: N/A;  . CARPAL TUNNEL RELEASE     bilateral  . CERVICAL DISC SURGERY    . CHOLECYSTECTOMY    . CORONARY ANGIOPLASTY  2002   2 times  . HERNIA REPAIR    . LUMBAR LAMINECTOMY     l3-4  . NEPHRECTOMY     partial  . TONSILLECTOMY    . TOTAL KNEE ARTHROPLASTY     L  . TUBAL LIGATION    .  ulnar nerve release     and thumb surgery    Family History  Problem Relation Age of Onset  . Colitis Mother     sepsis from c dif colitis  . Heart disease Father   . Coronary artery disease    . Stomach cancer Maternal Uncle     Social History  Substance Use Topics  . Smoking status: Former Smoker    Packs/day: 1.50    Years: 35.00    Types: Cigarettes    Quit date: 03/06/2008  . Smokeless tobacco: Never Used  . Alcohol use No     Current Medications and Allergies:    Current Outpatient Prescriptions:  .  aspirin EC 81 MG tablet, Take 81 mg by mouth daily., Disp: , Rfl:  .  atorvastatin (LIPITOR) 80 MG tablet, Take 1 tablet (80 mg total) by mouth daily., Disp: 30 tablet, Rfl: 6 .  Biotin 10000 MCG TABS, Take 10,000 mcg by mouth daily.  Dissolvable tablets, Disp: , Rfl:  .  blood glucose meter kit and supplies KIT, Dispense based on patient and insurance preference. Use to test daily. (FOR ICD-9 250.00, 250.01). 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  carvedilol (COREG) 6.25 MG tablet, Take 1 tablet (6.25 mg total) by mouth 2 (two) times daily with a meal., Disp: 180 tablet, Rfl: 3 .  Cholecalciferol (VITAMIN D3) 2000 units CHEW, Chew 2,000 Units by mouth daily., Disp: , Rfl:  .  clopidogrel (PLAVIX) 75 MG tablet, Take 1 tablet (75 mg total) by mouth daily., Disp: 30 tablet, Rfl: 6 .  diazepam (VALIUM) 2 MG tablet, Take 1 tablet (2 mg total) by mouth every 6 (six) hours as needed for anxiety., Disp: 30 tablet, Rfl: 0 .  glucose blood ([employer] ADVANCED GLUCOSE TEST) test strip, USE TO TEST 1 TIME DAILY AS DIRECTED E11.22, N18.1, Disp: 100 each, Rfl: 2 .  glucose blood test strip, Use once a day and as needed to test blood sugars, Disp: 100 each, Rfl: 12 .  HYDROcodone-acetaminophen (NORCO/VICODIN) 5-325 MG tablet, Take 1 tablet by mouth every 6 (six) hours as needed for moderate pain., Disp: , Rfl:  .  Multiple Vitamin (MULTIVITAMIN WITH MINERALS) TABS tablet, Take 1 tablet by mouth daily. Women's One a Day, Disp: , Rfl:  .  nitroGLYCERIN (NITROLINGUAL) 0.4 MG/SPRAY spray, Place 1 spray under the tongue every 5 (five) minutes as needed for chest pain., Disp: 4.9 g, Rfl: 3 .  pantoprazole (PROTONIX) 40 MG tablet, Take 1 tablet (40 mg total) by mouth daily., Disp: 30 tablet, Rfl: 6 .  tiZANidine (ZANAFLEX) 2 MG tablet, Take by mouth every 8 (eight) hours as needed for muscle spasms., Disp: , Rfl:  .  valsartan (DIOVAN) 320 MG tablet, TAKE 1 TABLET (320 MG TOTAL) BY MOUTH DAILY., Disp: 30 tablet, Rfl: 11 .  VENTOLIN HFA 108 (90 BASE) MCG/ACT inhaler, INHALE TWO PUFFS BY MOUTH EVERY SIX HOURS as needed for wheezing, Disp: 18 g, Rfl: 0   Allergies  Allergen Reactions  . Sulfa Antibiotics Diarrhea and Nausea And Vomiting       Patient Information Form: Screening and ROS    Do you feel safe in relationships? yes PHQ-2: negative  Review of Systems  General:  Negative for nexplained weight loss, fever Skin: Negative for new or changing mole, sore that won't heal HEENT: Negative for trouble hearing, trouble seeing, ringing in ears, mouth sores, hoarseness, change in voice, dysphagia CV:  Negative for chest pain, dyspnea, edema,  palpitations Resp: Negative for cough, dyspnea, hemoptysis GI: Negative for nausea, vomiting, diarrhea, constipation, abdominal pain, melena, hematochezia GU: Negative for dysuria, incontinence, urinary hesitance, hematuria, vaginal or penile discharge, polyuria, sexual difficulty, lumps in testicle or breasts Neuro: Negative for headaches, weakness, numbness, dizziness, passing out/fainting Psych: Negative for depression, anxiety, memory problems   Vitals:   Vitals:   03/10/16 0906  BP: (!) 148/86  Pulse: 67  Temp: 97.6 F (36.4 C)  TempSrc: Oral  SpO2: 97%  Weight: 177 lb 3.2 oz (80.4 kg)  Height: '5\' 3"'  (1.6 m)     Body mass index is 31.39 kg/m.   Physical Exam:    General: Alert, cooperative, appears stated age and no distress.  HEENT:  Normocephalic, without obvious abnormality, atraumatic. Conjunctivae/corneas clear. PERRL, EOM's intact. Normal TM's and external ear canals both ears. Nares normal. Septum midline. Mucosa normal. No drainage or sinus tenderness. Lips, mucosa, and tongue normal; teeth and gums normal.  Lungs: Clear to auscultation bilaterally.  Heart:: Regular rate and rhythm, S1, S2 normal, no murmur, click, rub or gallop.  Abdomen: Soft, non-tender; bowel sounds normal; no masses,  no organomegaly.  Extremities: Extremities normal, atraumatic, no cyanosis or edema.  Pulses: 2+ and symmetric.  Skin: Skin color, texture, turgor normal. No rashes or lesions.  Neurologic: Alert and oriented X 3, normal strength and tone. Normal symmetric. reflexes.  Normal coordination and gait.  Psych: Alert,oriented, in NAD with a full range of affect, normal behavior and no psychotic features  Msk:  Right shoulder with pain at rotator cuff insertion, decreased ROM with abduction.      Assessment and Plan:    Tricia [name] was seen today for establish care.  Diagnoses and all orders for this visit:  Chronic left-sided low back pain with left-sided sciatica  Former smoker      -      Will order low dose CT scan after other screening completed and after checking creatinine.   Osteopenia, unspecified location -     DG Bone Density  Breast cancer screening -     MM DIAG BREAST TOMO BILATERAL; Future  Chronic pain in right shoulder Comments: After trauma, followed by MW. MRI okay if not improving.    . Reviewed expectations re: course of current medical issues. . Discussed self-management of symptoms. . Outlined signs and symptoms indicating need for more acute intervention. . Patient verbalized understanding and all questions were answered. . See orders for this visit as documented in the electronic medical record. . Patient received an After Visit Summary.  Records requested if needed. I spent 45 minutes with this patient, greater than 50% was face-to-face time counseling regarding the above diagnoses.   Briscoe Deutscher, [place], [facility] 03/10/2016

## 2016-03-11 ENCOUNTER — Ambulatory Visit (INDEPENDENT_AMBULATORY_CARE_PROVIDER_SITE_OTHER): Payer: PPO | Admitting: Physician Assistant

## 2016-03-11 ENCOUNTER — Encounter: Payer: Self-pay | Admitting: Physician Assistant

## 2016-03-11 VITALS — BP 188/82 | HR 56 | Ht 62.0 in | Wt 180.0 lb

## 2016-03-11 DIAGNOSIS — I25119 Atherosclerotic heart disease of native coronary artery with unspecified angina pectoris: Secondary | ICD-10-CM | POA: Diagnosis not present

## 2016-03-11 DIAGNOSIS — R002 Palpitations: Secondary | ICD-10-CM | POA: Diagnosis not present

## 2016-03-11 DIAGNOSIS — E785 Hyperlipidemia, unspecified: Secondary | ICD-10-CM

## 2016-03-11 DIAGNOSIS — I10 Essential (primary) hypertension: Secondary | ICD-10-CM

## 2016-03-11 MED ORDER — AMLODIPINE BESYLATE 5 MG PO TABS: 5.0000 mg | ORAL_TABLET | Freq: Every day | ORAL | 3 refills | Status: DC

## 2016-03-11 NOTE — Patient Instructions (Addendum)
Medication Instructions:  Start Amlodipine 5 mg Once daily   Labwork: None today  Testing/Procedures: Schedule a [employer] Your physician has requested that you have a lexiscan myoview. For further information please visit [email or web address]. Please follow instruction sheet, as given.  Follow-Up: 1. Hypertension Clinic in 1 week at the [place] 2. Dr. Sherren Mocha or Richardson Dopp, PA-C in 2 weeks. IF WITH PA THEN SAME DAY DR. Burt Knack IS IN THE OFFICE  Any Other Special Instructions Will Be Listed Below (If Applicable). Call us if you have more of those palpitations or increased heart rates.  If you need a refill on your cardiac medications before your next appointment, please call your pharmacy.

## 2016-03-12 ENCOUNTER — Telehealth: Payer: Self-pay | Admitting: Family Medicine

## 2016-03-12 DIAGNOSIS — Z87891 Personal history of nicotine dependence: Secondary | ICD-10-CM

## 2016-03-12 NOTE — Telephone Encounter (Signed)
Spoke with patient and she has spoke with her insurance about coverage for the ex smoker screening. Please place the order.

## 2016-03-12 NOTE — Telephone Encounter (Signed)
Patient called to advise CMA/DO that Medicare does cover her low dose screening. Please set up her screening per patient. Please call patient back once done.   Thank you.

## 2016-03-12 NOTE — Telephone Encounter (Signed)
Done

## 2016-03-14 ENCOUNTER — Telehealth: Payer: Self-pay | Admitting: Family Medicine

## 2016-03-14 NOTE — Addendum Note (Signed)
Addended by: Briscoe Deutscher R on: 03/14/2016 02:37 PM   Modules accepted: Orders

## 2016-03-14 NOTE — Addendum Note (Signed)
Addended by: Briscoe Deutscher R on: 03/14/2016 01:57 PM   Modules accepted: Orders

## 2016-03-14 NOTE — Telephone Encounter (Signed)
Order placed for MRI shoulder.

## 2016-03-19 ENCOUNTER — Encounter (HOSPITAL_COMMUNITY): Payer: Self-pay

## 2016-03-20 ENCOUNTER — Telehealth (HOSPITAL_COMMUNITY): Payer: Self-pay

## 2016-03-20 ENCOUNTER — Ambulatory Visit (INDEPENDENT_AMBULATORY_CARE_PROVIDER_SITE_OTHER): Payer: PPO | Admitting: Pharmacist

## 2016-03-20 VITALS — BP 122/58 | HR 56

## 2016-03-20 DIAGNOSIS — I10 Essential (primary) hypertension: Secondary | ICD-10-CM

## 2016-03-20 NOTE — Telephone Encounter (Signed)
Encounter complete. 

## 2016-03-20 NOTE — Progress Notes (Signed)
Patient ID: Tricia [name]                 DOB: [birth year]                      MRN: [id number]     HPI: Tricia [name] is a 71 y.o. female patient of Dr Burt Knack referred by Richardson Dopp PA-C to the HTN clinic. PMH includes CAD, HTN, HL, DM and anxiety. Patient uses her smart watch to monitors HR and physical activity.  Presents today for evaluation after amlodipine 37m daily was initiated on 03/11/16 by PA-C.  Denies headaches, dizziness, swelling or fatigue.  Current HTN meds:  Amlodipine 593mdaily Carvedilol 6.2579mwice daily Valsartan 320m33milky  Previously tried:  HCTZ 25mg69mly Furosemide 20mg 43my Metoprolol 50mg t35m daily  BP goal: <130/80  Family History: The patient's family history includes Colitis in her mother; Heart disease in her father; Stomach cancer in her maternal uncle.  Social History: Former smoker. Denies smokeless tobacco and alcohol use  Exercise: cardiac rehab  Home BP readings: no records available  Wt Readings from Last 3 Encounters:  03/11/16 180 lb (81.6 kg)  03/19/16 175 lb 11.3 oz (79.7 kg)  03/10/16 177 lb 3.2 oz (80.4 kg)   BP Readings from Last 3 Encounters:  03/20/16 (!) 122/58  03/11/16 (!) 188/82  03/10/16 (!) 148/86   Pulse Readings from Last 3 Encounters:  03/20/16 (!) 56  03/11/16 (!) 56  03/10/16 67     Past Medical History:  Diagnosis Date  . Adenomatous polyp of colon 02/2004  . Anemia   . Anxiety   . Arthritis   . B12 deficiency   . CAD (coronary artery disease)    a. s/p remote BMS to LAD;  b. 09/2015 Inf STEMI/VF Arrest: LM nl, LAD 85p (staged PCI 2 days later w/ 3.0x32 Synergy DES), 40p/m, 25d, LCX 64m, RC95most/100p (2.75x32 Synergy DES), 42m, EF 24m5%.  . Depression   . DEPRESSION 09/22/2006   Qualifier: Diagnosis of  By: Williams,Jimmye Normannnye M Winfield Cunasatophytosis of groin and perianal area   . Diet Controlled Diabetes Mellitus   . Diverticulosis   . H/O echocardiogram    a. 09/2015 Echo: EF 60-65%, no  rwma, mild AI, mildly dil RA, mild to mod TR.  . HyperliMarland Kitchenidemia   . Hypertensive heart disease   . Low back pain    l5 disc  . Morbid obesity (HCC)   . [place]   . Osteoporosis   . Sleep apnea   . Sleep apnea 10/03/2009   Resolved after gastric bypass    . TOBACCO ABUSE 10/05/2009   Qualifier: Diagnosis of  By: Crenshaw,Stanford BreedC, BriKandyce Rudricular fibrillation (HCC) 08/2[place]017   a. In setting of inferior STEMI.    Current Outpatient Prescriptions on File Prior to Visit  Medication Sig Dispense Refill  . amLODipine (NORVASC) 5 MG tablet Take 1 tablet (5 mg total) by mouth daily. 90 tablet 3  . aspirin EC 81 MG tablet Take 81 mg by mouth daily.    . atorvasMarland Kitchenatin (LIPITOR) 80 MG tablet Take 1 tablet (80 mg total) by mouth daily. 30 tablet 6  . Biotin 10000 MCG TABS Take 10,000 mcg by mouth daily. Dissolvable tablets    . blood glucose meter kit and supplies KIT Dispense based on patient and insurance preference. Use to test daily. (FOR ICD-9 250.00, 250.01). Dx Code:E11.22 N18.1 1  each 0  . carvedilol (COREG) 6.25 MG tablet Take 1 tablet (6.25 mg total) by mouth 2 (two) times daily with a meal. 180 tablet 3  . Cholecalciferol (VITAMIN D3) 2000 units CHEW Chew 2,000 Units by mouth daily.    . clopidogrel (PLAVIX) 75 MG tablet Take 1 tablet (75 mg total) by mouth daily. 30 tablet 6  . diazepam (VALIUM) 2 MG tablet Take 1 tablet (2 mg total) by mouth every 6 (six) hours as needed for anxiety. 30 tablet 0  . glucose blood ([employer] ADVANCED GLUCOSE TEST) test strip USE TO TEST 1 TIME DAILY AS DIRECTED E11.22, N18.1 100 each 2  . glucose blood test strip Use once a day and as needed to test blood sugars 100 each 12  . HYDROcodone-acetaminophen (NORCO/VICODIN) 5-325 MG tablet Take 1 tablet by mouth every 6 (six) hours as needed for moderate pain.    . Multiple Vitamin (MULTIVITAMIN WITH MINERALS) TABS tablet Take 1 tablet by mouth daily. Women's One a Day    . nitroGLYCERIN  (NITROLINGUAL) 0.4 MG/SPRAY spray Place 1 spray under the tongue every 5 (five) minutes as needed for chest pain. 4.9 g 3  . pantoprazole (PROTONIX) 40 MG tablet Take 1 tablet (40 mg total) by mouth daily. 30 tablet 6  . tiZANidine (ZANAFLEX) 2 MG tablet Take by mouth every 8 (eight) hours as needed for muscle spasms.    . valsartan (DIOVAN) 320 MG tablet TAKE 1 TABLET (320 MG TOTAL) BY MOUTH DAILY. 30 tablet 11  . VENTOLIN HFA 108 (90 BASE) MCG/ACT inhaler INHALE TWO PUFFS BY MOUTH EVERY SIX HOURS as needed for wheezing 18 g 0   No current facility-administered medications on file prior to visit.     Allergies  Allergen Reactions  . Sulfa Antibiotics Diarrhea and Nausea And Vomiting    Blood pressure (!) 122/58, pulse (!) 56, SpO2 99 %.  Hypertension:  BP today is at goal of <130/80.  Patient denies ADR or intolerance to the new medication.  HR remains appropriate for current beta-blocker dose..  Will continue amlodipine 38m daily, carvedilol 6.293mtwice daily and valsartan 32090maily.  Patient encouraged to call clinic if systolic BP > 150124nsistently or if low BP noted.  Follow up with HTN clinic as needed and with cardiologist as previously scheduled.  Liya Strollo Rodriguez-Guzman PharmD, BCPSun River [id number]/2018 9:34 PM

## 2016-03-20 NOTE — Patient Instructions (Addendum)
Return for a follow up appointment in as needed  Your blood pressure today is 122/58 pulse 56  Check your blood pressure at home daily (if able) and keep record of the readings. (take 2 hours after medication taken and few times on the evening)  Take your BP meds as follows: Amlodipine 5mg  daily Carvedilol 6.25mg  twice daily Valsartan 320mg  daily   Bring all of your meds, your BP cuff and your record of home blood pressures to your next appointment.  Exercise as you're able, try to walk approximately 30 minutes per day.  Keep salt intake to a minimum, especially watch canned and prepared boxed foods.  Eat more fresh fruits and vegetables and fewer canned items.  Avoid eating in fast food restaurants.    HOW TO TAKE YOUR BLOOD PRESSURE: . Rest 5 minutes before taking your blood pressure. .  Don't smoke or drink caffeinated beverages for at least 30 minutes before. . Take your blood pressure before (not after) you eat. . Sit comfortably with your back supported and both feet on the floor (don't cross your legs). . Elevate your arm to heart level on a table or a desk. . Use the proper sized cuff. It should fit smoothly and snugly around your bare upper arm. There should be enough room to slip a fingertip under the cuff. The bottom edge of the cuff should be 1 inch above the crease of the elbow. . Ideally, take 3 measurements at one sitting and record the average.

## 2016-03-25 ENCOUNTER — Ambulatory Visit (HOSPITAL_COMMUNITY)
Admission: RE | Admit: 2016-03-25 | Discharge: 2016-03-25 | Disposition: A | Discharge status: Home / Self Care | Payer: PPO | Source: Ambulatory Visit | Attending: Cardiology | Admitting: Cardiology

## 2016-03-25 ENCOUNTER — Inpatient Hospital Stay (HOSPITAL_COMMUNITY): Admission: RE | Admit: 2016-03-25 | Payer: PPO | Source: Ambulatory Visit

## 2016-03-25 ENCOUNTER — Encounter (HOSPITAL_COMMUNITY): Payer: PPO

## 2016-03-25 DIAGNOSIS — I25119 Atherosclerotic heart disease of native coronary artery with unspecified angina pectoris: Secondary | ICD-10-CM | POA: Diagnosis not present

## 2016-03-25 DIAGNOSIS — R002 Palpitations: Secondary | ICD-10-CM | POA: Insufficient documentation

## 2016-03-25 DIAGNOSIS — I10 Essential (primary) hypertension: Secondary | ICD-10-CM | POA: Insufficient documentation

## 2016-03-25 LAB — MYOCARDIAL PERFUSION IMAGING
LV dias vol: 90 mL (ref 46–106)
LV sys vol: 37 mL
Peak HR: 88 {beats}/min
Rest HR: 48 {beats}/min
SDS: 1
SRS: 0
SSS: 1
TID: 1.2

## 2016-03-25 MED ORDER — TECHNETIUM TC 99M TETROFOSMIN IV KIT
10.3000 | PACK | Freq: Once | INTRAVENOUS | Status: AC | PRN
  Administered 2016-03-25: 10.3 via INTRAVENOUS
  Filled 2016-03-25: qty 11

## 2016-03-25 MED ORDER — AMINOPHYLLINE 25 MG/ML IV SOLN
75.0000 mg | Freq: Once | INTRAVENOUS | Status: AC
  Administered 2016-03-25: 75 mg via INTRAVENOUS

## 2016-03-25 MED ORDER — TECHNETIUM TC 99M TETROFOSMIN IV KIT
31.1000 | PACK | Freq: Once | INTRAVENOUS | Status: AC | PRN
  Administered 2016-03-25: 31.1 via INTRAVENOUS
  Filled 2016-03-25: qty 32

## 2016-03-25 MED ORDER — REGADENOSON 0.4 MG/5ML IV SOLN
0.4000 mg | Freq: Once | INTRAVENOUS | Status: AC
  Administered 2016-03-25: 0.4 mg via INTRAVENOUS

## 2016-03-26 ENCOUNTER — Ambulatory Visit
Admission: RE | Admit: 2016-03-26 | Discharge: 2016-03-26 | Disposition: A | Discharge status: Home / Self Care | Payer: PPO | Source: Ambulatory Visit | Attending: Family Medicine | Admitting: Family Medicine

## 2016-03-26 DIAGNOSIS — Z1231 Encounter for screening mammogram for malignant neoplasm of breast: Secondary | ICD-10-CM | POA: Diagnosis not present

## 2016-03-26 DIAGNOSIS — M810 Age-related osteoporosis without current pathological fracture: Secondary | ICD-10-CM | POA: Diagnosis not present

## 2016-03-26 DIAGNOSIS — Z780 Asymptomatic menopausal state: Secondary | ICD-10-CM | POA: Diagnosis not present

## 2016-03-27 ENCOUNTER — Other Ambulatory Visit: Payer: Self-pay | Admitting: Acute Care

## 2016-03-27 ENCOUNTER — Telehealth: Payer: Self-pay | Admitting: Physician Assistant

## 2016-03-27 ENCOUNTER — Encounter: Payer: Self-pay | Admitting: *Deleted

## 2016-03-27 DIAGNOSIS — Z87891 Personal history of nicotine dependence: Secondary | ICD-10-CM

## 2016-03-27 NOTE — Telephone Encounter (Signed)
Patient states that she received her stress tests results via MyChart and states that since they are normal she wants to cancel her appointment for tomorrow so she does not have to pay another co-pay. She states that her BP was normal at her visit at the HTN clinic at [facility] and doesn't see the need in coming back in tomorrow. Appointment was cancelled per patient's request. Patient would like to know if she should follow up in 3 months or when she should be seen again. Please advise.

## 2016-03-27 NOTE — Telephone Encounter (Signed)
The patient denies having any chest pain. Appointment was made for a follow up on 06/27/16. Patient instructed to call if she develops any chest pain. Patient verbalized understanding and was in agreement with this plan.

## 2016-03-27 NOTE — Telephone Encounter (Signed)
As long as she is feeling ok and is not having further chest pain, she can just follow up in 3 months. If she is still having chest pain, we should see her back sooner. Richardson Dopp, PA-C   03/27/2016 1:23 PM

## 2016-03-27 NOTE — Telephone Encounter (Signed)
New message      Pt received her normal stress test on mychart.  She has a stress test follow up appt scheduled for tomorrow.  She want to know if she really need to keep this appt.  Please call

## 2016-03-28 ENCOUNTER — Telehealth: Payer: Self-pay | Admitting: *Deleted

## 2016-03-28 ENCOUNTER — Ambulatory Visit: Payer: PPO | Admitting: Physician Assistant

## 2016-03-28 NOTE — Telephone Encounter (Signed)
Pt notified of normal Myoview results. Pt states she saw results on MY CHART with comment from [place]. PA test was normal. Pt thanked me though for my call as well.

## 2016-03-28 NOTE — Telephone Encounter (Signed)
Lmtcb to go over Myoview results.  

## 2016-04-01 ENCOUNTER — Encounter (HOSPITAL_COMMUNITY): Payer: Self-pay | Admitting: *Deleted

## 2016-04-01 ENCOUNTER — Ambulatory Visit (INDEPENDENT_AMBULATORY_CARE_PROVIDER_SITE_OTHER): Payer: PPO | Admitting: Family Medicine

## 2016-04-01 ENCOUNTER — Encounter: Payer: Self-pay | Admitting: Family Medicine

## 2016-04-01 VITALS — BP 136/78 | HR 54 | Temp 98.4°F | Ht 63.0 in | Wt 175.8 lb

## 2016-04-01 DIAGNOSIS — M816 Localized osteoporosis [Lequesne]: Secondary | ICD-10-CM | POA: Diagnosis not present

## 2016-04-01 DIAGNOSIS — M25511 Pain in right shoulder: Secondary | ICD-10-CM

## 2016-04-01 DIAGNOSIS — N181 Chronic kidney disease, stage 1: Secondary | ICD-10-CM

## 2016-04-01 DIAGNOSIS — E1122 Type 2 diabetes mellitus with diabetic chronic kidney disease: Secondary | ICD-10-CM

## 2016-04-01 DIAGNOSIS — G8929 Other chronic pain: Secondary | ICD-10-CM | POA: Diagnosis not present

## 2016-04-01 MED ORDER — BLOOD GLUCOSE METER KIT: PACK | 0 refills | Status: DC

## 2016-04-01 NOTE — Progress Notes (Signed)
Pre visit review using our clinic review tool, if applicable. No additional management support is needed unless otherwise documented below in the visit note. 

## 2016-04-01 NOTE — Patient Instructions (Addendum)
   The National Osteoporosis Foundation recommends that women with 10-year risks of hip fracture greater than 3% or risk of major fracture greater than 20% consider taking prescription medications or risk of major fracture of proven to decrese the risk of osteoporotic fractures.  These prescription medications would be in addition to your daily calcium supplements of 1000 mg and Vitamin D supplement of at least 800 units daily.

## 2016-04-01 NOTE — Progress Notes (Signed)
Tricia [name] is a 71 y.o. female is here to discuss:  History of Present Illness:    1. Patient here to discuss recent DXA (03/26/16). The BMD measured at Femur Neck Right is 0.690 g/cm2 with a T-score of -2.5. She started Calcium and Vitamin D. Admits that she has a difficult time with pills. Hx of severe reflux. Wants treatment for osteoporosis. No Hx of nontraumatic fracture.     Health Maintenance Due  Topic Date Due  . HEMOGLOBIN A1C  03/13/2016    PMHx, SurgHx, SocialHx, FamHx, Medications, and Allergies were reviewed in the Visit Navigator and updated as appropriate.    Patient Active Problem List   Diagnosis Date Noted  . Coronary artery disease involving native coronary artery of native heart without angina pectoris 03/10/2016  . Rib pain - s/p CPR 10/08/2015  . History of cardiac arrest 10/01/2015  . History of ST elevation myocardial infarction (STEMI) 10/01/2015  . Second hand smoke exposure 07/28/2015  . Postoperative malabsorption 07/27/2015  . Recurrent UTI 03/14/2014  . GERD (gastroesophageal reflux disease) 02/14/2014  . History of gastric bypass 02/14/2014  . Former smoker 02/14/2014  . Diabetes mellitus type II, controlled ([place]) 02/14/2014  . Anxiety state 10/03/2009  . ANEMIA, VITAMIN B12 DEFICIENCY 04/04/2009  . Osteoporosis 12/14/2006  . Hyperlipidemia 09/22/2006  . Essential hypertension 09/22/2006  . Osteoarthritis 09/22/2006  . Low back pain 09/22/2006    Social History  Substance Use Topics  . Smoking status: Former Smoker    Packs/day: 1.50    Years: 35.00    Types: Cigarettes    Quit date: 03/06/2008  . Smokeless tobacco: Never Used  . Alcohol use No     Current Medications and Allergies:    Current Outpatient Prescriptions:  .  amLODipine (NORVASC) 5 MG tablet, Take 1 tablet (5 mg total) by mouth daily., Disp: 90 tablet, Rfl: 3 .  aspirin EC 81 MG tablet, Take 81 mg by mouth daily., Disp: , Rfl:  .  atorvastatin (LIPITOR) 80 MG  tablet, Take 1 tablet (80 mg total) by mouth daily., Disp: 30 tablet, Rfl: 6 .  Biotin 10000 MCG TABS, Take 10,000 mcg by mouth daily. Dissolvable tablets, Disp: , Rfl:  .  blood glucose meter kit and supplies KIT, Dispense based on patient and insurance preference. Use to test daily. (FOR ICD-9 250.00, 250.01). 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  carvedilol (COREG) 6.25 MG tablet, Take 1 tablet (6.25 mg total) by mouth 2 (two) times daily with a meal., Disp: 180 tablet, Rfl: 3 .  Cholecalciferol (VITAMIN D3) 2000 units CHEW, Chew 2,000 Units by mouth daily., Disp: , Rfl:  .  clopidogrel (PLAVIX) 75 MG tablet, Take 1 tablet (75 mg total) by mouth daily., Disp: 30 tablet, Rfl: 6 .  diazepam (VALIUM) 2 MG tablet, Take 1 tablet (2 mg total) by mouth every 6 (six) hours as needed for anxiety., Disp: 30 tablet, Rfl: 0 .  glucose blood ([employer] ADVANCED GLUCOSE TEST) test strip, USE TO TEST 1 TIME DAILY AS DIRECTED E11.22, N18.1, Disp: 100 each, Rfl: 2 .  glucose blood test strip, Use once a day and as needed to test blood sugars, Disp: 100 each, Rfl: 12 .  HYDROcodone-acetaminophen (NORCO/VICODIN) 5-325 MG tablet, Take 1 tablet by mouth every 6 (six) hours as needed for moderate pain., Disp: , Rfl:  .  Multiple Vitamin (MULTIVITAMIN WITH MINERALS) TABS tablet, Take 1 tablet by mouth daily. Women's One a Day, Disp: ,  Rfl:  .  nitroGLYCERIN (NITROLINGUAL) 0.4 MG/SPRAY spray, Place 1 spray under the tongue every 5 (five) minutes as needed for chest pain., Disp: 4.9 g, Rfl: 3 .  pantoprazole (PROTONIX) 40 MG tablet, Take 1 tablet (40 mg total) by mouth daily., Disp: 30 tablet, Rfl: 6 .  tiZANidine (ZANAFLEX) 2 MG tablet, Take by mouth every 8 (eight) hours as needed for muscle spasms., Disp: , Rfl:  .  valsartan (DIOVAN) 320 MG tablet, TAKE 1 TABLET (320 MG TOTAL) BY MOUTH DAILY., Disp: 30 tablet, Rfl: 11 .  VENTOLIN HFA 108 (90 BASE) MCG/ACT inhaler, INHALE TWO PUFFS BY MOUTH EVERY SIX HOURS as needed  for wheezing, Disp: 18 g, Rfl: 0 .  blood glucose meter kit and supplies, Dispense based on patient and insurance preference. Use up to four times daily as directed. (FOR ICD-9 250.00, 250.01)., Disp: 1 each, Rfl: 0   Allergies  Allergen Reactions  . Sulfa Antibiotics Diarrhea and Nausea And Vomiting      Review of Systems   Review of Systems: The patient denies chills, fever, weight loss/gain, fatigue, lack of appetite, difficulty swallowing, sore throat, earache, post-nasal drip, chest pain, palpitations, changes in blood pressures, swelling of legs, cough, dyspnea, wheezing, change in bowel habits, nausea, vomiting, changes in urination, skin changes, dizziness, headaches, numbness, changes in balance or coordination, anxiety, depression, memory changes, swollen glands, easy bruising.    Vitals:   Vitals:   04/01/16 1126 04/01/16 1205  BP: (!) 156/82 136/78  Pulse: (!) 54   Temp: 98.4 F (36.9 C)   TempSrc: Oral   SpO2: 97%   Weight: 175 lb 12.8 oz (79.7 kg)   Height: 5' 3" (1.6 m)      Body mass index is 31.14 kg/m.   Physical Exam:   Physical Exam  Constitutional: She is oriented to person, place, and time and well-developed, well-nourished, and in no distress. No distress.  Neck: No thyromegaly present.  Cardiovascular: Normal rate and intact distal pulses.   Pulmonary/Chest: Effort normal. She has no wheezes.  Abdominal: Soft.  Neurological: She is alert and oriented to person, place, and time.  Skin: Skin is warm. No rash noted.    Assessment and Plan:    Tricia [name] was seen today for follow-up.  Diagnoses and all orders for this visit:  Localized osteoporosis without current pathological fracture Comments: T-score for hip is -2.5. FRAX score for hip fracture risk is > 3%. SEE AVS for calculation. Patient would benefit from treatment. Not a candidate for oral bisphosphonate due to history of severe reflux. Would like to try Prolia if appropriate.  Controlled  type 2 diabetes mellitus with stage 1 chronic kidney disease, without long-term current use of insulin (HCC) Comments: Strips changes for insurance cost reasons.  -     blood glucose meter kit and supplies; Dispense based on patient and insurance preference. Use up to four times daily as directed. (FOR ICD-9 250.00, 250.01).  Chronic pain in right shoulder Comments: Waiting until next month to get MRI, cannot afford copay for test.    . Reviewed expectations re: course of current medical issues. . Discussed self-management of symptoms. . Outlined signs and symptoms indicating need for more acute intervention. . Patient verbalized understanding and all questions were answered. . See orders for this visit as documented in the electronic medical record. . Patient received an After Visit Summary.   Briscoe Deutscher, [place], [place] 04/01/2016   Meds ordered this encounter  Medications  .  DISCONTD: blood glucose meter kit and supplies    Sig: 100 each by Other route as directed. Dispense based on patient and insurance preference. Use up to four times daily as directed. (FOR ICD-9 250.00, 250.01).  . blood glucose meter kit and supplies    Sig: Dispense based on patient and insurance preference. Use up to four times daily as directed. (FOR ICD-9 250.00, 250.01).    Dispense:  1 each    Refill:  0    Please dispense meter and supplies that patient insurance will pay for.    Order Specific Question:   Number of strips    Answer:   100    Order Specific Question:   Number of lancets    Answer:   100    No orders of the defined types were placed in this encounter.

## 2016-04-02 ENCOUNTER — Encounter: Payer: Self-pay | Admitting: Podiatry

## 2016-04-02 ENCOUNTER — Ambulatory Visit (INDEPENDENT_AMBULATORY_CARE_PROVIDER_SITE_OTHER): Payer: PPO | Admitting: Podiatry

## 2016-04-02 ENCOUNTER — Other Ambulatory Visit: Payer: Self-pay | Admitting: Nurse Practitioner

## 2016-04-02 ENCOUNTER — Telehealth: Payer: Self-pay

## 2016-04-02 VITALS — Ht 62.5 in | Wt 175.0 lb

## 2016-04-02 DIAGNOSIS — M2011 Hallux valgus (acquired), right foot: Secondary | ICD-10-CM | POA: Diagnosis not present

## 2016-04-02 DIAGNOSIS — Q828 Other specified congenital malformations of skin: Secondary | ICD-10-CM | POA: Diagnosis not present

## 2016-04-02 DIAGNOSIS — L84 Corns and callosities: Secondary | ICD-10-CM

## 2016-04-02 DIAGNOSIS — E1149 Type 2 diabetes mellitus with other diabetic neurological complication: Secondary | ICD-10-CM

## 2016-04-02 NOTE — Progress Notes (Signed)
   Subjective:    Patient ID: Tricia [name], female    DOB: 04/05/1945, 71 y.o.   MRN: [id number]  HPI this patient presents to the office with chief complaint of painful callus on the inside of her right she says she has previously been seen at salon where they worked on her callus. She worked on her callus this past week and caused pain and discomfort  She felt she needed to be seen by a podiatrist for an evaluation of her painful calluses.  She says she does use a pumice  stone at home but this also aggravated her callus.  She presents the office for preventative foot care services. Patient is diabetic, but since she has lost significant amount of weight she is no longer on medication.    Review of Systems  All other systems reviewed and are negative.      Objective:   Physical Exam GENERAL APPEARANCE: Alert, conversant. Appropriately groomed. No acute distress.  VASCULAR: Pedal pulses are barely   palpable at  [facility] and PT bilateral.  Capillary refill time is immediate to all digits.  Cold digits noted.   NEUROLOGIC: sensation is normal to 5.07 monofilament at 5/5 sites left  but diminished to digits right foot.[place] touch is intact bilateral, Muscle strength normal.  MUSCULOSKELETAL: acceptable muscle strength, tone and stability bilateral.  Intrinsic muscluature intact bilateral.  HAV  Right foot with hammer toes 2,3 right foot.  DERMATOLOGIC: skin color, texture, and turgor are within normal limits.  No preulcerative lesions or ulcers  are seen, no interdigital maceration noted.  No open lesions present.  Digital nails are asymptomatic but pincer nails are noted. No drainage noted. Callus noted medially 1st metatarsal and medially hallux right.         Assessment & Plan:  Callus  Right foot  Diabetes with mild neuropathy.  IE  Debride callus.  Told her to return for callus and future nail work in 3 months.   Gardiner Barefoot DPM

## 2016-04-02 NOTE — Telephone Encounter (Signed)
Received call from patient's pharmacy.  Rx for diabetic supplies needed ICD-10 code.  Rx was written with ICD-9 code.  Gave correct ICD-10 code to pharmacy.

## 2016-04-06 ENCOUNTER — Other Ambulatory Visit: Payer: Self-pay | Admitting: Family Medicine

## 2016-04-06 ENCOUNTER — Other Ambulatory Visit: Payer: Self-pay | Admitting: Nurse Practitioner

## 2016-04-07 ENCOUNTER — Other Ambulatory Visit: Payer: Self-pay

## 2016-04-07 MED ORDER — FREESTYLE LANCETS MISC: 5 refills | Status: DC

## 2016-04-07 MED ORDER — GLUCOSE BLOOD VI STRP: ORAL_STRIP | 5 refills | Status: DC

## 2016-04-08 ENCOUNTER — Other Ambulatory Visit: Payer: Self-pay

## 2016-04-08 MED ORDER — GLUCOSE BLOOD VI STRP: ORAL_STRIP | 5 refills | Status: DC

## 2016-04-08 MED ORDER — FREESTYLE LANCETS MISC: 5 refills | Status: DC

## 2016-04-10 NOTE — Addendum Note (Signed)
Addended by: Briscoe Deutscher R on: 04/10/2016 02:58 PM   Modules accepted: Orders

## 2016-04-16 ENCOUNTER — Encounter: Payer: Self-pay | Admitting: Acute Care

## 2016-04-16 ENCOUNTER — Ambulatory Visit (INDEPENDENT_AMBULATORY_CARE_PROVIDER_SITE_OTHER)
Admission: RE | Admit: 2016-04-16 | Discharge: 2016-04-16 | Disposition: A | Discharge status: Home / Self Care | Payer: PPO | Source: Ambulatory Visit | Attending: Acute Care | Admitting: Acute Care

## 2016-04-16 ENCOUNTER — Ambulatory Visit (INDEPENDENT_AMBULATORY_CARE_PROVIDER_SITE_OTHER): Payer: PPO | Admitting: Acute Care

## 2016-04-16 DIAGNOSIS — Z87891 Personal history of nicotine dependence: Secondary | ICD-10-CM | POA: Diagnosis not present

## 2016-04-16 NOTE — Progress Notes (Signed)
Shared Decision Making Visit Lung Cancer Screening Program [phone])   Eligibility:  Age 72 y.o.  Pack Years Smoking History Calculation 82 pack years (# packs/per year x # years smoked)  Recent History of coughing up blood  no  Unexplained weight loss? no ( >Than 15 pounds within the last 6 months )  Prior History Lung / other cancer no (Diagnosis within the last 5 years already requiring surveillance chest CT Scans).  Smoking Status Former Smoker  Former Smokers: Years since quit: 8 years  Quit Date: 03/2008  Visit Components:  Discussion included one or more decision making aids. yes  Discussion included risk/benefits of screening. yes  Discussion included potential follow up diagnostic testing for abnormal scans. yes  Discussion included meaning and risk of over diagnosis. yes  Discussion included meaning and risk of False Positives. yes  Discussion included meaning of total radiation exposure. yes  Counseling Included:  Importance of adherence to annual lung cancer LDCT screening. yes  Impact of comorbidities on ability to participate in the program. yes  Ability and willingness to under diagnostic treatment. yes  Smoking Cessation Counseling:  Current Smokers:   Discussed importance of smoking cessation. NA  Information about tobacco cessation classes and interventions provided to patient. yes  Patient provided with "ticket" for LDCT Scan. yes  Symptomatic Patient. no  Counseling  Diagnosis Code: Tobacco Use Z72.0  Asymptomatic Patient yes  Counseling (Intermediate counseling: > three minutes counseling) [id number]  Former Smokers:   Discussed the importance of maintaining cigarette abstinence. yes  Diagnosis Code: Personal History of Nicotine Dependence. V42.595  Information about tobacco cessation classes and interventions provided to patient. Yes  Patient provided with "ticket" for LDCT Scan. yes  Written Order for Lung Cancer Screening with  LDCT placed in Epic. Yes (CT Chest Lung Cancer Screening Low Dose W/O CM) [id number] Z12.2-Screening of respiratory organs Z87.891-Personal history of nicotine dependence  I spent 2 minutes discussing the need to maintain smoking abstinence and the health benefits of doing so.  I spent 25 minutes of face to face time with Ms. Matt discussing the risks and benefits of lung cancer screening. We viewed a power point together that explained in detail the above noted topics. We took the time to pause the power point at intervals to allow for questions to be asked and answered to ensure understanding. We discussed that she had taken the single most powerful action possible to decrease her risk of developing lung cancer when she quit smoking. I counseled her to remain smoke free, and to contact me if she ever had the desire to smoke again so that I can provide resources and tools to help support the effort to remain smoke free. We discussed the time and location of the scan, and that either Amorita or I will call with the results within  24-48 hours of receiving them. She has my card and contact information in the event she needs to speak with me, in addition to a copy of the power point we reviewed as a resource. She verbalized understanding of all of the above and had no further questions upon leaving the office.   We discussed the high incidence of CAD noted on the scans. Ms. Grossi is currently on  statin therapy in addition to having had multiple stents placed as recently as 11/2015. She is already aware of this diagnosis.   Magdalen Spatz, NP 04/16/2016

## 2016-04-17 ENCOUNTER — Other Ambulatory Visit: Payer: Self-pay | Admitting: Acute Care

## 2016-04-17 DIAGNOSIS — Z87891 Personal history of nicotine dependence: Secondary | ICD-10-CM

## 2016-04-19 ENCOUNTER — Encounter: Payer: Self-pay | Admitting: Family Medicine

## 2016-04-19 DIAGNOSIS — D126 Benign neoplasm of colon, unspecified: Secondary | ICD-10-CM

## 2016-04-19 DIAGNOSIS — F419 Anxiety disorder, unspecified: Secondary | ICD-10-CM | POA: Insufficient documentation

## 2016-04-19 DIAGNOSIS — G8929 Other chronic pain: Secondary | ICD-10-CM

## 2016-04-19 DIAGNOSIS — F3341 Major depressive disorder, recurrent, in partial remission: Secondary | ICD-10-CM

## 2016-04-19 DIAGNOSIS — F329 Major depressive disorder, single episode, unspecified: Secondary | ICD-10-CM | POA: Insufficient documentation

## 2016-04-19 DIAGNOSIS — I7090 Unspecified atherosclerosis: Secondary | ICD-10-CM

## 2016-04-19 DIAGNOSIS — M5442 Lumbago with sciatica, left side: Secondary | ICD-10-CM

## 2016-04-19 DIAGNOSIS — I708 Atherosclerosis of other arteries: Secondary | ICD-10-CM

## 2016-04-19 DIAGNOSIS — R0781 Pleurodynia: Secondary | ICD-10-CM

## 2016-04-19 DIAGNOSIS — G4730 Sleep apnea, unspecified: Secondary | ICD-10-CM | POA: Insufficient documentation

## 2016-04-19 DIAGNOSIS — F32A Depression, unspecified: Secondary | ICD-10-CM | POA: Insufficient documentation

## 2016-04-19 DIAGNOSIS — J449 Chronic obstructive pulmonary disease, unspecified: Secondary | ICD-10-CM | POA: Insufficient documentation

## 2016-04-19 DIAGNOSIS — J431 Panlobular emphysema: Secondary | ICD-10-CM

## 2016-04-19 DIAGNOSIS — G4733 Obstructive sleep apnea (adult) (pediatric): Secondary | ICD-10-CM

## 2016-04-19 DIAGNOSIS — M816 Localized osteoporosis [Lequesne]: Secondary | ICD-10-CM

## 2016-04-19 DIAGNOSIS — N261 Atrophy of kidney (terminal): Secondary | ICD-10-CM | POA: Insufficient documentation

## 2016-04-19 DIAGNOSIS — I700 Atherosclerosis of aorta: Secondary | ICD-10-CM

## 2016-04-19 DIAGNOSIS — K5790 Diverticulosis of intestine, part unspecified, without perforation or abscess without bleeding: Secondary | ICD-10-CM | POA: Insufficient documentation

## 2016-04-29 ENCOUNTER — Ambulatory Visit (INDEPENDENT_AMBULATORY_CARE_PROVIDER_SITE_OTHER): Payer: PPO | Admitting: Family Medicine

## 2016-04-29 ENCOUNTER — Ambulatory Visit (INDEPENDENT_AMBULATORY_CARE_PROVIDER_SITE_OTHER): Payer: PPO

## 2016-04-29 ENCOUNTER — Encounter: Payer: Self-pay | Admitting: Family Medicine

## 2016-04-29 VITALS — BP 137/76 | HR 52 | Temp 97.6°F | Ht 62.5 in | Wt 177.8 lb

## 2016-04-29 DIAGNOSIS — R0781 Pleurodynia: Secondary | ICD-10-CM

## 2016-04-29 DIAGNOSIS — M816 Localized osteoporosis [Lequesne]: Secondary | ICD-10-CM

## 2016-04-29 NOTE — Progress Notes (Signed)
Tricia [name] is a 71 y.o. female here for a new problem.  History of Present Illness:   Water quality scientist, CMA, acting as scribe for Dr. Juleen China.  Chief Complaint  Patient presents with  . Acute Visit  . Breast Pain   Left rib pain, mid-axillary to left breast. Pain with palpation and movement. Started a few days ago. No known trauma or overuse. No Hx of the same. No skin changes. No treatment. Cardiovascular ROS: no chest pain or dyspnea on exertion. Respiratory ROS: no cough, shortness of breath, or wheezing. Gastrointestinal ROS: no abdominal pain, change in bowel habits, or black or bloody stools. Dermatological ROS: negative for pruritus, rash and skin lesion changes.   PMHx, SurgHx, SocialHx, Medications, and Allergies were reviewed in the Visit Navigator and updated as appropriate.  Current Medications:   .  amLODipine (NORVASC) 5 MG tablet, Take 1 tablet (5 mg total) by mouth daily., Disp: 90 tablet, Rfl: 3 .  aspirin EC 81 MG tablet, Take 81 mg by mouth daily., Disp: , Rfl:  .  atorvastatin (LIPITOR) 80 MG tablet, TAKE 1 TABLET (80 MG TOTAL) BY MOUTH DAILY., Disp: 90 tablet, Rfl: 3 .  Biotin 10000 MCG TABS, Take 10,000 mcg by mouth daily. Dissolvable tablets, Disp: , Rfl:  .  blood glucose meter kit and supplies KIT, Dispense based on patient and insurance preference. Use to test daily. (FOR ICD-9 250.00, 250.01). 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  blood glucose meter kit and supplies, Dispense based on patient and insurance preference. Use up to four times daily as directed. (FOR ICD-9 250.00, 250.01)., Disp: 1 each, Rfl: 0 .  carvedilol (COREG) 6.25 MG tablet, Take 1 tablet (6.25 mg total) by mouth 2 (two) times daily with a meal., Disp: 180 tablet, Rfl: 3 .  Cholecalciferol (VITAMIN D3) 2000 units CHEW, Chew 2,000 Units by mouth daily., Disp: , Rfl:  .  clopidogrel (PLAVIX) 75 MG tablet, TAKE 1 TABLET (75 MG TOTAL) BY MOUTH DAILY., Disp: 30 tablet, Rfl: 11 .  diazepam  (VALIUM) 2 MG tablet, Take 1 tablet (2 mg total) by mouth every 6 (six) hours as needed for anxiety., Disp: 30 tablet, Rfl: 0 .  HYDROcodone-acetaminophen (NORCO/VICODIN) 5-325 MG tablet, Take 1 tablet by mouth every 6 (six) hours as needed for moderate pain., Disp: , Rfl:  .  nitroGLYCERIN (NITROLINGUAL) 0.4 MG/SPRAY spray, Place 1 spray under the tongue every 5 (five) minutes as needed for chest pain., Disp: 4.9 g, Rfl: 3 .  pantoprazole (PROTONIX) 40 MG tablet, TAKE 1 TABLET (40 MG TOTAL) BY MOUTH DAILY., Disp: 30 tablet, Rfl: 11 .  tiZANidine (ZANAFLEX) 2 MG tablet, Take by mouth every 8 (eight) hours as needed for muscle spasms., Disp: , Rfl:  .  valsartan (DIOVAN) 320 MG tablet, TAKE 1 TABLET (320 MG TOTAL) BY MOUTH DAILY., Disp: 30 tablet, Rfl: 11 .  VENTOLIN HFA 108 (90 BASE) MCG/ACT inhaler, INHALE TWO PUFFS BY MOUTH EVERY SIX HOURS as needed for wheezing, Disp: 18 g, Rfl: 0   Review of Systems:   Review of Systems  Constitutional: Negative for chills and fever.  HENT: Negative for congestion and sore throat.   Eyes: Negative for blurred vision.  Respiratory: Negative for cough.   Cardiovascular: Negative for chest pain and palpitations.  Gastrointestinal: Negative for abdominal pain.  Skin: Negative for rash.  Neurological: Negative for loss of consciousness and headaches.    Vitals:   Vitals:   04/29/16 1134  BP: 137/76  Pulse: (!) 52  Temp: 97.6 F (36.4 C)  TempSrc: Oral  SpO2: 97%  Weight: 177 lb 12.8 oz (80.6 kg)  Height: 5' 2.5" (1.588 m)     Body mass index is 32 kg/m.  Physical Exam:   Physical Exam  Constitutional: She appears well-nourished.  HENT:  Head: Normocephalic and atraumatic.  Eyes: EOM are normal. Pupils are equal, round, and reactive to light.  Neck: Normal range of motion. Neck supple.  Cardiovascular: Normal rate, regular rhythm, normal heart sounds and intact distal pulses.   Pulmonary/Chest: Effort normal.    Abdominal: Soft.  Skin:  Skin is warm.  Psychiatric: She has a normal mood and affect. Her behavior is normal.  Nursing note and vitals reviewed.   Assessment and Plan:    Tricia [name] was seen today for acute visit and breast pain.  Diagnoses and all orders for this visit:  Rib pain on left side Comments: Likely contusion. Symptomatic care and red flags reviewed. Orders: -     DG Ribs Unilateral W/Chest Left - no acute findings.  Localized osteoporosis without current pathological fracture Comments: No fracture. In process of obtaining financial help for Prolia for patient.   . Reviewed expectations re: course of current medical issues. . Discussed self-management of symptoms. . Outlined signs and symptoms indicating need for more acute intervention. . Patient verbalized understanding and all questions were answered. . See orders for this visit as documented in the electronic medical record. . Patient received an After-Visit Summary.  CMA served as Education administrator during this visit. History, Physical, and Plan performed by medical provider. Documentation and orders reviewed and attested to. Briscoe Deutscher, D.O.   Briscoe Deutscher, D.O.

## 2016-04-29 NOTE — Progress Notes (Signed)
Pre visit review using our clinic review tool, if applicable. No additional management support is needed unless otherwise documented below in the visit note. 

## 2016-05-07 ENCOUNTER — Telehealth: Payer: Self-pay | Admitting: *Deleted

## 2016-05-07 NOTE — Telephone Encounter (Signed)
Patient is returning your call RE Prolia.  Thank you,  -LL

## 2016-05-07 NOTE — Telephone Encounter (Signed)
Spoke with [employer] representative. Merry Proud stated patient can apply for financial assistance through [place]. Merry Proud provided steps and attempt was made to contact patient to provide steps. Left voicemail for patient.   Prolia is currently in the office.   Awaiting call back from patient.

## 2016-05-07 NOTE — Telephone Encounter (Signed)
-----   Message from Briscoe Deutscher, DO sent at 04/07/2016  7:07 PM EST ----- Patient may need financial assist from company. ew ----- Message ----- From: Durwin Glaze, CMA Sent: 04/07/2016  10:30 AM To: Briscoe Deutscher, DO  Gave information to [place]. She handles Prolia  ----- Message ----- From: Briscoe Deutscher, DO Sent: 04/06/2016   7:27 PM To: Durwin Glaze, CMA  Talk to lea about this patient - needs prolia. Cost issue. ew ----- Message ----- From: Briscoe Deutscher, DO Sent: 04/02/2016   9:15 PM To: Briscoe Deutscher, DO  prolia

## 2016-05-08 NOTE — Telephone Encounter (Signed)
Attempted to contact patient.  Left voice mail to call back.

## 2016-05-08 NOTE — Telephone Encounter (Signed)
Spoke to patient. Patient is aware to visit [email or web address] to apply for financial assistance. I answered all questions she had about application and stated she plans to call office once she gets communication of approval. Once approved, we may schedule for her Prolia injection. Awaiting call back from patient.

## 2016-05-12 NOTE — Telephone Encounter (Signed)
appt scheduled earlier this morning

## 2016-05-12 NOTE — Telephone Encounter (Signed)
Please schedule patient for Prolia injection on nurse visit schedule. We have received documentation she has been approved with [employer]

## 2016-05-14 ENCOUNTER — Ambulatory Visit (INDEPENDENT_AMBULATORY_CARE_PROVIDER_SITE_OTHER): Payer: PPO | Admitting: *Deleted

## 2016-05-14 DIAGNOSIS — M816 Localized osteoporosis [Lequesne]: Secondary | ICD-10-CM | POA: Diagnosis not present

## 2016-05-14 MED ORDER — DENOSUMAB 60 MG/ML ~~LOC~~ SOLN
60.0000 mg | Freq: Once | SUBCUTANEOUS | Status: AC
  Administered 2016-05-14: 60 mg via SUBCUTANEOUS

## 2016-06-09 ENCOUNTER — Encounter: Payer: Self-pay | Admitting: Family Medicine

## 2016-06-09 ENCOUNTER — Encounter: Payer: Self-pay | Admitting: Surgical

## 2016-06-09 ENCOUNTER — Ambulatory Visit (INDEPENDENT_AMBULATORY_CARE_PROVIDER_SITE_OTHER): Payer: PPO | Admitting: Family Medicine

## 2016-06-09 VITALS — BP 136/74 | HR 55 | Temp 97.5°F | Ht 62.5 in | Wt 179.6 lb

## 2016-06-09 DIAGNOSIS — J431 Panlobular emphysema: Secondary | ICD-10-CM

## 2016-06-09 DIAGNOSIS — M159 Polyosteoarthritis, unspecified: Secondary | ICD-10-CM

## 2016-06-09 DIAGNOSIS — E559 Vitamin D deficiency, unspecified: Secondary | ICD-10-CM

## 2016-06-09 DIAGNOSIS — E785 Hyperlipidemia, unspecified: Secondary | ICD-10-CM | POA: Diagnosis not present

## 2016-06-09 DIAGNOSIS — I10 Essential (primary) hypertension: Secondary | ICD-10-CM | POA: Diagnosis not present

## 2016-06-09 DIAGNOSIS — I708 Atherosclerosis of other arteries: Secondary | ICD-10-CM

## 2016-06-09 DIAGNOSIS — Z9884 Bariatric surgery status: Secondary | ICD-10-CM

## 2016-06-09 DIAGNOSIS — I7090 Unspecified atherosclerosis: Secondary | ICD-10-CM

## 2016-06-09 DIAGNOSIS — D518 Other vitamin B12 deficiency anemias: Secondary | ICD-10-CM | POA: Diagnosis not present

## 2016-06-09 DIAGNOSIS — M8949 Other hypertrophic osteoarthropathy, multiple sites: Secondary | ICD-10-CM

## 2016-06-09 DIAGNOSIS — M150 Primary generalized (osteo)arthritis: Secondary | ICD-10-CM

## 2016-06-09 MED ORDER — HYDROCODONE-ACETAMINOPHEN 10-325 MG PO TABS
1.0000 | ORAL_TABLET | Freq: Three times a day (TID) | ORAL | 0 refills | Status: DC | PRN
Start: 2016-06-09 — End: 2016-11-12

## 2016-06-09 MED ORDER — NITROGLYCERIN 0.4 MG SL SUBL: 0.4000 mg | SUBLINGUAL_TABLET | SUBLINGUAL | 0 refills | Status: DC | PRN

## 2016-06-09 MED ORDER — ALBUTEROL SULFATE HFA 108 (90 BASE) MCG/ACT IN AERS: INHALATION_SPRAY | RESPIRATORY_TRACT | 3 refills | Status: DC

## 2016-06-09 NOTE — Progress Notes (Signed)
Tricia [name] is a 71 y.o. female is here for follow up.  History of Present Illness:   Shaune Pascal CMA acting as scribe for Dr. Juleen China.  HPI Patient is here today to follow up for her hypertension. Patient takes her medication like it is prescribed. Denies any cardiac symptoms.   1. Primary osteoarthritis.  Patient requests refill hydrocodone. She takes Vicodin 10/325 when necessary moderate to severe pain. She states that 30 pills will last for months. She'll be going on vacation next few weeks to make sure that she had medications on hand. She is willing to sign a controlled substance contract. No red flags or findings on review of database.    2. Panlobular emphysema ([place]). History of. Worsened with seasonal allergic rhinitis. Requests refill of albuterol. Uses 1-2 times per week. Denies fever, chest pain, shortness of breath, lower extremity edema.   3. HTN: Home blood pressure readings at goal. Avoiding excessive salt intake. Trying to exercise on a regular basis. Denies chest pain. Taking medications as prescribed without side effects.   Wt Readings from Last 3 Encounters:  06/09/16 179 lb 9.6 oz (81.5 kg)  04/29/16 177 lb 12.8 oz (80.6 kg)  04/02/16 175 lb (79.4 kg)    reports that she quit smoking about 8 years ago. Her smoking use included Cigarettes. She has a 82.00 pack-year smoking history. She has never used smokeless tobacco. BP Readings from Last 3 Encounters:  06/09/16 136/74  04/29/16 137/76  04/01/16 136/78   Lab Results  Component Value Date   CREATININE 1.01 (H) 11/29/2015     Health Maintenance Due  Topic Date Due  . HEMOGLOBIN A1C  03/13/2016  . FOOT EXAM  06/07/2016   PMHx, SurgHx, SocialHx, FamHx, Medications, and Allergies were reviewed in the Visit Navigator and updated as appropriate.   Patient Active Problem List   Diagnosis Date Noted  . Right renal atrophy 04/19/2016  . COPD (chronic obstructive pulmonary disease) ([place]) 04/19/2016  .  Aortic atherosclerosis ([place]) 04/19/2016  . Atherosclerosis of arteries 04/19/2016  . Sleep apnea   . Diverticulosis   . Depression   . Anxiety   . Coronary artery disease involving native coronary artery of native heart without angina pectoris 03/10/2016  . Rib pain - s/p CPR 10/08/2015  . History of cardiac arrest 10/01/2015  . History of ST elevation myocardial infarction (STEMI) 10/01/2015  . Second hand smoke exposure 07/28/2015  . Postoperative malabsorption 07/27/2015  . Recurrent UTI 03/14/2014  . GERD (gastroesophageal reflux disease) 02/14/2014  . History of gastric bypass 02/14/2014  . Former smoker 02/14/2014  . Diabetes mellitus type II, controlled ([place]) 02/14/2014  . Anxiety state 10/03/2009  . Anemia, B12 deficiency 04/04/2009  . Osteoporosis 12/14/2006  . Hyperlipidemia 09/22/2006  . Essential hypertension 09/22/2006  . Osteoarthritis 09/22/2006  . Low back pain 09/22/2006  . Adenomatous polyp of colon 02/04/2004   Social History  Substance Use Topics  . Smoking status: Former Smoker    Packs/day: 2.00    Years: 41.00    Types: Cigarettes    Quit date: 03/06/2008  . Smokeless tobacco: Never Used  . Alcohol use No   Current Medications and Allergies:   Current Outpatient Prescriptions:  .  amLODipine (NORVASC) 5 MG tablet, Take 1 tablet (5 mg total) by mouth daily., Disp: 90 tablet, Rfl: 3 .  aspirin EC 81 MG tablet, Take 81 mg by mouth daily., Disp: , Rfl:  .  atorvastatin (LIPITOR) 80 MG tablet, TAKE  1 TABLET (80 MG TOTAL) BY MOUTH DAILY., Disp: 90 tablet, Rfl: 3 .  Biotin 10000 MCG TABS, Take 10,000 mcg by mouth daily. Dissolvable tablets, Disp: , Rfl:  .  blood glucose meter kit and supplies KIT, Dispense based on patient and insurance preference. Use to test daily. (FOR ICD-9 250.00, 250.01). 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  blood glucose meter kit and supplies, Dispense based on patient and insurance preference. Use up to four times daily as  directed. (FOR ICD-9 250.00, 250.01)., Disp: 1 each, Rfl: 0 .  carvedilol (COREG) 6.25 MG tablet, Take 1 tablet (6.25 mg total) by mouth 2 (two) times daily with a meal., Disp: 180 tablet, Rfl: 3 .  Cholecalciferol (VITAMIN D3) 2000 units CHEW, Chew 2,000 Units by mouth daily., Disp: , Rfl:  .  clopidogrel (PLAVIX) 75 MG tablet, TAKE 1 TABLET (75 MG TOTAL) BY MOUTH DAILY., Disp: 30 tablet, Rfl: 11 .  diazepam (VALIUM) 2 MG tablet, Take 1 tablet (2 mg total) by mouth every 6 (six) hours as needed for anxiety., Disp: 30 tablet, Rfl: 0 .  glucose blood (FREESTYLE LITE) test strip, TEST UP TO FOUR TIMES DAILY Dx: E11.22, Disp: 360 each, Rfl: 5 .  HYDROcodone-acetaminophen (NORCO/VICODIN) 5-325 MG tablet, Take 1 tablet by mouth every 6 (six) hours as needed for moderate pain., Disp: , Rfl:  .  Lancets (FREESTYLE) lancets, TEST UP TO FOUR TIMES DAILY Dx: E11.22, Disp: 360 each, Rfl: 5 .  Multiple Vitamin (MULTIVITAMIN WITH MINERALS) TABS tablet, Take 1 tablet by mouth daily. Women's One a Day, Disp: , Rfl:  .  nitroGLYCERIN (NITROLINGUAL) 0.4 MG/SPRAY spray, Place 1 spray under the tongue every 5 (five) minutes as needed for chest pain., Disp: 4.9 g, Rfl: 3 .  pantoprazole (PROTONIX) 40 MG tablet, TAKE 1 TABLET (40 MG TOTAL) BY MOUTH DAILY., Disp: 30 tablet, Rfl: 11 .  tiZANidine (ZANAFLEX) 2 MG tablet, Take by mouth every 8 (eight) hours as needed for muscle spasms., Disp: , Rfl:  .  valsartan (DIOVAN) 320 MG tablet, TAKE 1 TABLET (320 MG TOTAL) BY MOUTH DAILY., Disp: 30 tablet, Rfl: 11 .  VENTOLIN HFA 108 (90 BASE) MCG/ACT inhaler, INHALE TWO PUFFS BY MOUTH EVERY SIX HOURS as needed for wheezing, Disp: 18 g, Rfl: 0  Allergies  Allergen Reactions  . Sulfa Antibiotics Diarrhea and Nausea And Vomiting   Review of Systems   Review of Systems  Constitutional: Negative for chills, fever and malaise/fatigue.  HENT: Negative for congestion, ear pain, sinus pain and sore throat.   Eyes: Negative for  blurred vision and double vision.  Respiratory: Negative for cough, shortness of breath and wheezing.   Cardiovascular: Negative for chest pain, palpitations and leg swelling.  Gastrointestinal: Negative for abdominal pain, constipation, diarrhea and vomiting.  Genitourinary: Negative for dysuria.  Musculoskeletal: Positive for back pain. Negative for joint pain and neck pain.       Chronic.   Neurological: Negative for dizziness and headaches.  Psychiatric/Behavioral: Negative for depression, hallucinations and memory loss.   Vitals:   Vitals:   06/09/16 0932  BP: 136/74  Pulse: (!) 55  Temp: 97.5 F (36.4 C)  TempSrc: Oral  SpO2: 99%  Weight: 179 lb 9.6 oz (81.5 kg)  Height: 5' 2.5" (1.588 m)     Body mass index is 32.33 kg/m.   Physical Exam:   Physical Exam  Constitutional: She appears well-developed and well-nourished. No distress.  HENT:  Head: Normocephalic and atraumatic.  Eyes: EOM are normal. Pupils are equal, round, and reactive to light.  Neck: Normal range of motion. Neck supple.  Cardiovascular: Normal rate, regular rhythm, normal heart sounds and intact distal pulses.   Pulmonary/Chest: Effort normal and breath sounds normal.  Abdominal: Soft. Bowel sounds are normal.  Neurological: She is alert.  Skin: Skin is warm. Capillary refill takes less than 2 seconds.  Psychiatric: She has a normal mood and affect. Her behavior is normal.  Nursing note and vitals reviewed.  Assessment and Plan:    Tricia [name] was seen today for follow-up.  Diagnoses and all orders for this visit:  Primary osteoarthritis involving multiple joints -     HYDROcodone-acetaminophen (NORCO) 10-325 MG tablet; Take 1 tablet by mouth every 8 (eight) hours as needed.  Hyperlipidemia, unspecified hyperlipidemia type -     Lipid panel; Future  Anemia, B12 deficiency -     CBC; Future -     Vitamin B12; Future  Essential hypertension -     Comprehensive metabolic panel; Future  Hx  of gastric bypass -     Vitamin B12; Future  Panlobular emphysema ([facility]) -     albuterol (VENTOLIN HFA) 108 (90 Base) MCG/ACT inhaler; INHALE TWO PUFFS BY MOUTH EVERY SIX HOURS as needed for wheezing  Atherosclerosis of arteries -     nitroGLYCERIN (NITROSTAT) 0.4 MG SL tablet; Place 1 tablet (0.4 mg total) under the tongue every 5 (five) minutes as needed for chest pain.  Vitamin D deficiency -     VITAMIN D 25 Hydroxy (Vit-D Deficiency, Fractures); Future   . Reviewed expectations re: course of current medical issues. . Discussed self-management of symptoms. . Outlined signs and symptoms indicating need for more acute intervention. . Patient verbalized understanding and all questions were answered. . See orders for this visit as documented in the electronic medical record. . Patient received an After Visit Summary.   CMA served as Education administrator during this visit. History, Physical, and Plan performed by medical provider. Documentation and orders reviewed and attested to. Briscoe Deutscher, D.O.  Briscoe Deutscher, [place], [place] 06/09/2016  Future Appointments Date Time Provider [place]  06/18/2016 10:00 AM [facility] None  06/25/2016 8:30 AM Briscoe Deutscher, DO [facility] None  06/27/2016 8:15 AM Kathlen Mody, [place], PA-C [facility]

## 2016-06-17 ENCOUNTER — Other Ambulatory Visit: Payer: Self-pay

## 2016-06-17 MED ORDER — ALBUTEROL SULFATE HFA 108 (90 BASE) MCG/ACT IN AERS: 2.0000 | INHALATION_SPRAY | Freq: Four times a day (QID) | RESPIRATORY_TRACT | 3 refills | Status: DC | PRN

## 2016-06-18 ENCOUNTER — Other Ambulatory Visit: Payer: PPO

## 2016-06-18 ENCOUNTER — Other Ambulatory Visit (INDEPENDENT_AMBULATORY_CARE_PROVIDER_SITE_OTHER): Payer: PPO

## 2016-06-18 DIAGNOSIS — I10 Essential (primary) hypertension: Secondary | ICD-10-CM | POA: Diagnosis not present

## 2016-06-18 DIAGNOSIS — D518 Other vitamin B12 deficiency anemias: Secondary | ICD-10-CM

## 2016-06-18 DIAGNOSIS — E785 Hyperlipidemia, unspecified: Secondary | ICD-10-CM | POA: Diagnosis not present

## 2016-06-18 DIAGNOSIS — Z9884 Bariatric surgery status: Secondary | ICD-10-CM | POA: Diagnosis not present

## 2016-06-18 DIAGNOSIS — E559 Vitamin D deficiency, unspecified: Secondary | ICD-10-CM | POA: Diagnosis not present

## 2016-06-18 LAB — VITAMIN B12: Vitamin B-12: 1071 pg/mL — ABNORMAL HIGH (ref 211–911)

## 2016-06-18 LAB — LIPID PANEL
Cholesterol: 122 mg/dL (ref 0–200)
HDL: 51.2 mg/dL (ref >39.00)
LDL Cholesterol: 54 mg/dL (ref 0–99)
NonHDL: 70.7
Total CHOL/HDL Ratio: 2
Triglycerides: 82 mg/dL (ref 0.0–149.0)
VLDL: 16.4 mg/dL (ref 0.0–40.0)

## 2016-06-18 LAB — COMPREHENSIVE METABOLIC PANEL
ALT: 17 U/L (ref 0–35)
AST: 17 U/L (ref 0–37)
Albumin: 3.8 g/dL (ref 3.5–5.2)
Alkaline Phosphatase: 108 U/L (ref 39–117)
BUN: 19 mg/dL (ref 6–23)
CO2: 29 mEq/L (ref 19–32)
Calcium: 8.4 mg/dL (ref 8.4–10.5)
Chloride: 107 mEq/L (ref 96–112)
Creatinine, Ser: 0.78 mg/dL (ref 0.40–1.20)
GFR: 77.88 mL/min (ref >60.00)
Glucose, Bld: 89 mg/dL (ref 70–99)
Potassium: 3.4 mEq/L — ABNORMAL LOW (ref 3.5–5.1)
Sodium: 141 mEq/L (ref 135–145)
Total Bilirubin: 0.5 mg/dL (ref 0.2–1.2)
Total Protein: 6.2 g/dL (ref 6.0–8.3)

## 2016-06-18 LAB — CBC
HCT: 38.2 % (ref 36.0–46.0)
Hemoglobin: 12.8 g/dL (ref 12.0–15.0)
MCHC: 33.5 g/dL (ref 30.0–36.0)
MCV: 87 fl (ref 78.0–100.0)
Platelets: 202 10*3/uL (ref 150.0–400.0)
RBC: 4.39 Mil/uL (ref 3.87–5.11)
RDW: 12.7 % (ref 11.5–15.5)
WBC: 5.7 10*3/uL (ref 4.0–10.5)

## 2016-06-18 LAB — VITAMIN D 25 HYDROXY (VIT D DEFICIENCY, FRACTURES): VITD: 50.24 ng/mL (ref 30.00–100.00)

## 2016-06-24 NOTE — Progress Notes (Signed)
Pre visit review using our clinic review tool, if applicable. No additional management support is needed unless otherwise documented below in the visit note. 

## 2016-06-24 NOTE — Progress Notes (Deleted)
Subjective:   Tricia [name] is a 71 y.o. female who presents for Medicare Annual (Subsequent) preventive examination.  Review of Systems:  No ROS.  Medicare Wellness Visit.   Sleep patterns:  Home Safety/Smoke Alarms:   Living environment; residence and Firearm Safety: Seat Belt Safety/Bike Helmet: Wears seat belt.   Counseling:   Eye Exam-  Dental-  Female:   Pap-  NA     Mammo-    03/26/2016  Normal Dexa scan-    03/26/2016   The BMD measured at Femur Neck Right is 0.690 g/cm2 with a T-score of -2.5. This patient is considered osteoporotic according to [place] [facility]) criteria. CCS- 10/12/2012 Per Letter from Dr Fuller Plan: The polyps removed from your colon were adenomatous. 5 year recall.     Objective:     Vitals: There were no vitals taken for this visit.  There is no height or weight on file to calculate BMI.   Tobacco History  Smoking Status  . Former Smoker  . Packs/day: 2.00  . Years: 41.00  . Types: Cigarettes  . Quit date: 03/06/2008  Smokeless Tobacco  . Never Used     Counseling given: Not Answered   Past Medical History:  Diagnosis Date  . Adenomatous polyp of colon 02/2004  . Anemia   . Anxiety   . Arthritis   . B12 deficiency   . CAD (coronary artery disease)    a. s/p remote BMS to LAD;  b. 09/2015 Inf STEMI/VF Arrest: LM nl, LAD 85p (staged PCI 2 days later w/ 3.0x32 Synergy DES), 40p/m, 25d, LCX 68m RCA 80ost/100p (2.75x32 Synergy DES), 653mEF 55-65%. // c. Myoview 2/18: EF 59, no ischemia or infarction; Normal study  . Depression   . Dermatophytosis of groin and perianal area   . Diet Controlled Diabetes Mellitus   . Diverticulosis   . H/O echocardiogram    a. 09/2015 Echo: EF 60-65%, no rwma, mild AI, mildly dil RA, mild to mod TR.  . Marland Kitchenyperlipidemia   . Hypertensive heart disease   . Low back pain    l5 disc  . Morbid [place] ([place]  . Osteoporosis   . Sleep apnea 10/03/2009   Resolved after gastric bypass    . TOBACCO  ABUSE 10/05/2009   Qualifier: Diagnosis of  By: CrStanford BreedMD, FAKandyce Rud . Ventricular fibrillation ([place]/28/2017   a. In setting of inferior STEMI.   Past Surgical History:  Procedure Laterality Date  . ABDOMINAL HYSTERECTOMY    . APPENDECTOMY    . BARIATRIC SURGERY    . CARDIAC CATHETERIZATION N/A 10/01/2015   Procedure: Left Heart Cath and Coronary Angiography;  Surgeon: DaLeonie ManMD;  [facility];  Service: Cardiovascular;  Laterality: N/A;  . CARDIAC CATHETERIZATION N/A 10/01/2015   Procedure: Coronary Stent Intervention;  Surgeon: DaLeonie ManMD;  [facility];  Service: Cardiovascular;  Laterality: N/A;  . CARDIAC CATHETERIZATION N/A 10/03/2015   Procedure: Coronary Stent Intervention;  Surgeon: ThTroy SineMD;  [facility];  Service: Cardiovascular;  Laterality: N/A;  . CARDIAC CATHETERIZATION N/A 10/03/2015   Procedure: Coronary/Graft Angiography;  Surgeon: ThTroy SineMD;  [facility];  Service: Cardiovascular;  Laterality: N/A;  . CARPAL TUNNEL RELEASE     bilateral  . CERVICAL DISC SURGERY    . CHOLECYSTECTOMY    . CORONARY ANGIOPLASTY  2002   2 times  .  HERNIA REPAIR    . LUMBAR LAMINECTOMY     l3-4  . NEPHRECTOMY     partial  . TONSILLECTOMY    . TOTAL KNEE ARTHROPLASTY     L  . TUBAL LIGATION    . ulnar nerve release     and thumb surgery   Family History  Problem Relation Age of Onset  . Colitis Mother        sepsis from c dif colitis  . Heart disease Father   . Coronary artery disease Unknown   . Stomach cancer Maternal Uncle    History  Sexual Activity  . Sexual activity: Yes    Outpatient Encounter Prescriptions as of 06/25/2016  Medication Sig  . albuterol (PROVENTIL HFA;VENTOLIN HFA) 108 (90 Base) MCG/ACT inhaler Inhale 2 puffs into the lungs every 6 (six) hours as needed for wheezing or shortness of breath.  Marland Kitchen amLODipine (NORVASC) 5 MG tablet Take 1  tablet (5 mg total) by mouth daily.  Marland Kitchen aspirin EC 81 MG tablet Take 81 mg by mouth daily.  Marland Kitchen atorvastatin (LIPITOR) 80 MG tablet TAKE 1 TABLET (80 MG TOTAL) BY MOUTH DAILY.  Marland Kitchen Biotin 10000 MCG TABS Take 10,000 mcg by mouth daily. Dissolvable tablets  . blood glucose meter kit and supplies KIT Dispense based on patient and insurance preference. Use to test daily. (FOR ICD-9 250.00, 250.01). Dx Code:E11.22 N18.1  . blood glucose meter kit and supplies Dispense based on patient and insurance preference. Use up to four times daily as directed. (FOR ICD-9 250.00, 250.01).  . carvedilol (COREG) 6.25 MG tablet Take 1 tablet (6.25 mg total) by mouth 2 (two) times daily with a meal.  . Cholecalciferol (VITAMIN D3) 2000 units CHEW Chew 2,000 Units by mouth daily.  . clopidogrel (PLAVIX) 75 MG tablet TAKE 1 TABLET (75 MG TOTAL) BY MOUTH DAILY.  . diazepam (VALIUM) 2 MG tablet Take 1 tablet (2 mg total) by mouth every 6 (six) hours as needed for anxiety.  Marland Kitchen glucose blood (FREESTYLE LITE) test strip TEST UP TO FOUR TIMES DAILY Dx: E11.22  . HYDROcodone-acetaminophen (NORCO) 10-325 MG tablet Take 1 tablet by mouth every 8 (eight) hours as needed.  . Lancets (FREESTYLE) lancets TEST UP TO FOUR TIMES DAILY Dx: E11.22  . Multiple Vitamin (MULTIVITAMIN WITH MINERALS) TABS tablet Take 1 tablet by mouth daily. Women's One a Day  . nitroGLYCERIN (NITROLINGUAL) 0.4 MG/SPRAY spray Place 1 spray under the tongue every 5 (five) minutes as needed for chest pain.  . nitroGLYCERIN (NITROSTAT) 0.4 MG SL tablet Place 1 tablet (0.4 mg total) under the tongue every 5 (five) minutes as needed for chest pain.  . pantoprazole (PROTONIX) 40 MG tablet TAKE 1 TABLET (40 MG TOTAL) BY MOUTH DAILY.  Marland Kitchen tiZANidine (ZANAFLEX) 2 MG tablet Take by mouth every 8 (eight) hours as needed for muscle spasms.  . valsartan (DIOVAN) 320 MG tablet TAKE 1 TABLET (320 MG TOTAL) BY MOUTH DAILY.   No facility-administered encounter medications on file as  of 06/25/2016.     Activities of Daily Living In your present state of health, do you have any difficulty performing the following activities: 10/15/2015 10/01/2015  Hearing? - N  Vision? - N  Difficulty concentrating or making decisions? - N  Walking or climbing stairs? - N  Dressing or bathing? - N  Doing errands, shopping? - N  Conservation officer, nature and eating ? N -  Using the Toilet? N -  In the past six months, have you  accidently leaked urine? N -  Do you have problems with loss of bowel control? N -  Managing your Medications? N -  Managing your Finances? N -  Housekeeping or managing your Housekeeping? Y -  Some recent data might be hidden    Patient Care Team: Briscoe Deutscher, DO as PCP - General (Family Medicine)    Assessment:    Physical assessment deferred to PCP.  Exercise Activities and Dietary recommendations   Diet (meal preparation, eat out, water intake, caffeinated beverages, dairy products, fruits and vegetables):  Breakfast: Lunch:  Dinner:      Goals    . Weight < 180 lb (81.647 kg)          Is currently trying to lose weight What is the "Mediterranean" diet?  There's no one "Mediterranean" diet. At least 16 countries border the [employer]. Diets vary between these countries and also between regions within a country. Many differences in culture, ethnic background, religion, economy and agricultural production result in different diets. But the common Mediterranean dietary pattern has these characteristics: high consumption of fruits, vegetables, bread and other cereals, potatoes, beans, nuts and seeds  olive oil is an important monounsaturated fat source  dairy products, fish and poultry are consumed in low to moderate amounts, and little red meat is eaten  eggs are consumed zero to four times a week  wine is consumed in low to moderate amounts Does a Mediterranean-style diet follow American Heart Association dietary  recommendations? Mediterranean-style diets are often close to our dietary recommendations, but they don't follow them exactly. In general, the diets of Mediterranean peoples contain a relatively high percentage of calories from fat. This is thought to contribute to the increasing obesity in these countries, which is becoming a concern. People who follow the average Mediterranean diet eat less saturated fat than those who eat the average American diet. In fact, saturated fat consumption is well within our dietary guidelines. More than half the fat calories in a Mediterranean diet come from monounsaturated fats (mainly from olive oil). Monounsaturated fat doesn't raise blood cholesterol levels the way saturated fat does. The incidence of heart disease in [place] countries is lower than in the [place]. Death rates are lower, too. But this may not be entirely due to the diet. Lifestyle factors (such as more physical activity and extended social support systems) may also play a part. Before advising people to follow a Mediterranean diet, we need more studies to find out whether the diet itself or other lifestyle factors account for the lower deaths from heart disease.  American Heart Association       Fall Risk Fall Risk  11/29/2015 10/15/2015 06/22/2015 06/08/2015 03/14/2014  Falls in the past year? No No Yes No No  Number falls in past yr: - - 1 - -  Injury with Fall? - - No - -  Risk for fall due to : - History of fall(s);Impaired mobility;Medication side effect - - -  Follow up - - Education provided - -   Depression Screen PHQ 2/9 Scores 03/10/2016 12/03/2015 11/29/2015 10/15/2015  PHQ - 2 Score 0 0 0 2  PHQ- 9 Score - - - 6     Cognitive Function MMSE - Mini Mental State Exam 06/22/2015  Not completed: (No Data)        Immunization History  Administered Date(s) Administered  . Influenza Split 01/21/2011  . Influenza Whole 11/19/2007  . Influenza, High Dose Seasonal PF  10/16/2015  . Influenza-Unspecified  12/18/2013  . Pneumococcal Conjugate-13 08/26/2013  . Pneumococcal Polysaccharide-23 02/04/2003, 06/08/2015  . Td 02/03/2005   Screening Tests Health Maintenance  Topic Date Due  . HEMOGLOBIN A1C  03/13/2016  . FOOT EXAM  06/07/2016  . TETANUS/TDAP  01/30/2098 (Originally 02/04/2015)  . OPHTHALMOLOGY EXAM  07/04/2016  . INFLUENZA VACCINE  09/03/2016  . COLONOSCOPY  10/12/2017  . MAMMOGRAM  03/26/2018  . DEXA SCAN  Addressed  . Hepatitis C Screening  Completed  . PNA vac Low Risk Adult  Completed      Plan:      I have personally reviewed and noted the following in the patient's chart:   . Medical and social history . Use of alcohol, tobacco or illicit drugs  . Current medications and supplements . Functional ability and status . Nutritional status . Physical activity . Advanced directives . List of other physicians . Vitals . Screenings to include cognitive, depression, and falls . Referrals and appointments  In addition, I have reviewed and discussed with patient certain preventive protocols, quality metrics, and best practice recommendations. A written personalized care plan for preventive services as well as general preventive health recommendations were provided to patient.     Ree Edman, RN  06/24/2016

## 2016-06-25 ENCOUNTER — Encounter: Payer: Self-pay | Admitting: Family Medicine

## 2016-06-25 ENCOUNTER — Ambulatory Visit (INDEPENDENT_AMBULATORY_CARE_PROVIDER_SITE_OTHER): Payer: PPO | Admitting: Family Medicine

## 2016-06-25 VITALS — BP 138/86 | HR 56 | Temp 97.5°F | Ht 62.5 in | Wt 181.0 lb

## 2016-06-25 DIAGNOSIS — I2510 Atherosclerotic heart disease of native coronary artery without angina pectoris: Secondary | ICD-10-CM | POA: Diagnosis not present

## 2016-06-25 DIAGNOSIS — K912 Postsurgical malabsorption, not elsewhere classified: Secondary | ICD-10-CM | POA: Diagnosis not present

## 2016-06-25 DIAGNOSIS — E1121 Type 2 diabetes mellitus with diabetic nephropathy: Secondary | ICD-10-CM

## 2016-06-25 DIAGNOSIS — Z0000 Encounter for general adult medical examination without abnormal findings: Secondary | ICD-10-CM

## 2016-06-25 LAB — POCT GLYCOSYLATED HEMOGLOBIN (HGB A1C): Hemoglobin A1C: 5.1

## 2016-06-25 LAB — FOLATE: Folate: >24 ng/mL (ref >5.9)

## 2016-06-25 LAB — IRON: Iron: 113 ug/dL (ref 42–145)

## 2016-06-25 LAB — FERRITIN: Ferritin: 19.9 ng/mL (ref 10.0–291.0)

## 2016-06-25 NOTE — Progress Notes (Signed)
Subjective:    Tricia [name] CMA acting as scribe for Dr. Juleen China.  Tricia [name] is a 71 y.o. female and is here for a comprehensive physical exam.  HPI   Health Maintenance Due  Topic Date Due  . HEMOGLOBIN A1C  03/13/2016  . FOOT EXAM  06/07/2016   PMHx, SurgHx, SocialHx, Medications, and Allergies were reviewed in the Visit Navigator and updated as appropriate.   Past Medical History:  Diagnosis Date  . Adenomatous polyp of colon 02/2004  . Anemia   . Anxiety   . Arthritis   . B12 deficiency   . CAD (coronary artery disease)    a. s/p remote BMS to LAD;  b. 09/2015 Inf STEMI/VF Arrest: LM nl, LAD 85p (staged PCI 2 days later w/ 3.0x32 Synergy DES), 40p/m, 25d, LCX 24m, RCA 80ost/100p (2.75x32 Synergy DES), 29m, EF 55-65%. // c. Myoview 2/18: EF 59, no ischemia or infarction; Normal study  . Depression   . Dermatophytosis of groin and perianal area   . Diet Controlled Diabetes Mellitus   . Diverticulosis   . H/O echocardiogram    a. 09/2015 Echo: EF 60-65%, no rwma, mild AI, mildly dil RA, mild to mod TR.  Marland Kitchen Hyperlipidemia   . Hypertensive heart disease   . Low back pain    l5 disc  . Morbid obesity ([place])   . Osteoporosis   . Sleep apnea 10/03/2009   Resolved after gastric bypass    . TOBACCO ABUSE 10/05/2009   Qualifier: Diagnosis of  By: Stanford Breed, MD, Kandyce Rud   . Ventricular fibrillation ([place]) 10/01/2015   a. In setting of inferior STEMI.    Past Surgical History:  Procedure Laterality Date  . ABDOMINAL HYSTERECTOMY    . APPENDECTOMY    . BARIATRIC SURGERY    . CARDIAC CATHETERIZATION N/A 10/01/2015   Procedure: Left Heart Cath and Coronary Angiography;  Surgeon: Leonie Man, MD;  Location: [facility];  Service: Cardiovascular;  Laterality: N/A;  . CARDIAC CATHETERIZATION N/A 10/01/2015   Procedure: Coronary Stent Intervention;  Surgeon: Leonie Man, MD;  Location: [facility];  Service: Cardiovascular;  Laterality: N/A;  .  CARDIAC CATHETERIZATION N/A 10/03/2015   Procedure: Coronary Stent Intervention;  Surgeon: Troy Sine, MD;  Location: [facility];  Service: Cardiovascular;  Laterality: N/A;  . CARDIAC CATHETERIZATION N/A 10/03/2015   Procedure: Coronary/Graft Angiography;  Surgeon: Troy Sine, MD;  Location: [facility];  Service: Cardiovascular;  Laterality: N/A;  . CARPAL TUNNEL RELEASE     bilateral  . CERVICAL DISC SURGERY    . CHOLECYSTECTOMY    . CORONARY ANGIOPLASTY  2002   2 times  . HERNIA REPAIR    . LUMBAR LAMINECTOMY     l3-4  . NEPHRECTOMY     partial  . TONSILLECTOMY    . TOTAL KNEE ARTHROPLASTY     L  . TUBAL LIGATION    . ulnar nerve release     and thumb surgery    Family History  Problem Relation Age of Onset  . Colitis Mother        sepsis from c dif colitis  . Heart disease Father   . Coronary artery disease Unknown   . Stomach cancer Maternal Uncle    Social History  Substance Use Topics  . Smoking status: Former Smoker    Packs/day: 2.00    Years: 41.00    Types: Cigarettes  Quit date: 03/06/2008  . Smokeless tobacco: Never Used  . Alcohol use No    Review of Systems:   Review of Systems  Constitutional: Positive for malaise/fatigue. Negative for chills and fever.  HENT: Negative for ear pain, sinus pain and sore throat.   Eyes: Negative for blurred vision and double vision.  Respiratory: Negative for cough, shortness of breath and wheezing.   Cardiovascular: Negative for chest pain, palpitations and leg swelling.  Gastrointestinal: Negative for abdominal pain, diarrhea and vomiting.  Musculoskeletal: Positive for back pain. Negative for joint pain and neck pain.       Chronic.   Neurological: Negative for dizziness and headaches.  Psychiatric/Behavioral: Negative for depression, hallucinations and memory loss.    Objective:   BP 138/86   Pulse (!) 56   Temp 97.5 F (36.4 C) (Oral)   Ht 5' 2.5" (1.588 m)   Wt 181 lb (82.1 kg)    SpO2 97%   BMI 32.58 kg/m  Body mass index is 32.58 kg/m.   General Appearance:    Alert, cooperative, no distress, appears stated age  Head:    Normocephalic, without obvious abnormality, atraumatic  Eyes:    PERRL, conjunctiva/corneas clear, EOM's intact, fundi    benign, both eyes  Ears:    Normal TM's and external ear canals, both ears  Nose:   Nares normal, septum midline, mucosa normal, no drainage    or sinus tenderness  Throat:   Lips, mucosa, and tongue normal; teeth and gums normal  Neck:   Supple, symmetrical, trachea midline, no adenopathy;    thyroid:  no enlargement/tenderness/nodules; no carotid   bruit or JVD  Back:     Symmetric, no curvature, ROM normal, no CVA tenderness  Lungs:     Clear to auscultation bilaterally, respirations unlabored  Chest Wall:    No tenderness or deformity   Heart:    Regular rate and rhythm, S1 and S2 normal, no murmur, rub   or gallop  Breast Exam:    No tenderness, masses, or nipple abnormality  Abdomen:     Soft, non-tender, bowel sounds active all four quadrants,    no masses, no organomegaly  Extremities:   Extremities normal, atraumatic, no cyanosis or edema  Pulses:   2+ and symmetric all extremities  Skin:   Skin color, texture, turgor normal, no rashes or lesions  Lymph nodes:   Cervical, supraclavicular, and axillary nodes normal  Neurologic:   CNII-XII intact, normal strength, sensation and reflexes    throughout   Results for orders placed or performed in visit on 06/25/16  POCT glycosylated hemoglobin (Hb A1C)  Result Value Ref Range   Hemoglobin A1C 5.1    Assessment/Plan:   [name] was seen today for annual exam.  Diagnoses and all orders for this visit:  Routine physical examination  Coronary artery disease involving native coronary artery of native heart without angina pectoris -     EKG 12-Lead  Postoperative malabsorption -     Zinc -     Ceruloplasmin -     Folate -     Vitamin B1 -     PTH, intact  (no Ca) -     Ferritin -     Iron  Controlled type 2 diabetes mellitus with diabetic nephropathy, without long-term current use of insulin (HCC) -     POCT glycosylated hemoglobin (Hb A1C)    Patient Counseling: [x]    Nutrition: Stressed importance of moderation in sodium/caffeine intake, saturated  fat and cholesterol, caloric balance, sufficient intake of fresh fruits, vegetables, fiber, calcium, iron, and 1 mg of folate supplement per day (for females capable of pregnancy).  [x]    Stressed the importance of regular exercise.   [x]    Substance Abuse: Discussed cessation/primary prevention of tobacco, alcohol, or other drug use; driving or other dangerous activities under the influence; availability of treatment for abuse.   [x]    Injury prevention: Discussed safety belts, safety helmets, smoke detector, smoking near bedding or upholstery.   [x]    Sexuality: Discussed sexually transmitted diseases, partner selection, use of condoms, avoidance of unintended pregnancy  and contraceptive alternatives.  [x]    Dental health: Discussed importance of regular tooth brushing, flossing, and dental visits.  [x]    Health maintenance and immunizations reviewed. Please refer to Health maintenance section.   Briscoe Deutscher, DO [facility] served as Education administrator during this visit. History, Physical, and Plan performed by medical provider. The above documentation has been reviewed and is accurate and complete. Briscoe Deutscher, D.O.

## 2016-06-26 LAB — PARATHYROID HORMONE, INTACT (NO CA): PTH: 119 pg/mL — ABNORMAL HIGH (ref 14–64)

## 2016-06-27 ENCOUNTER — Ambulatory Visit: Payer: PPO | Admitting: Physician Assistant

## 2016-06-27 LAB — CERULOPLASMIN: Ceruloplasmin: 37 mg/dL (ref 18–53)

## 2016-06-28 LAB — ZINC: Zinc: 88 ug/dL (ref 60–130)

## 2016-06-30 LAB — VITAMIN B1: Vitamin B1 (Thiamine): 34 nmol/L — ABNORMAL HIGH (ref 8–30)

## 2016-07-06 ENCOUNTER — Encounter: Payer: Self-pay | Admitting: Family Medicine

## 2016-07-06 DIAGNOSIS — N2581 Secondary hyperparathyroidism of renal origin: Secondary | ICD-10-CM | POA: Insufficient documentation

## 2016-08-07 ENCOUNTER — Telehealth: Payer: Self-pay | Admitting: Family Medicine

## 2016-08-07 ENCOUNTER — Ambulatory Visit (INDEPENDENT_AMBULATORY_CARE_PROVIDER_SITE_OTHER): Payer: PPO | Admitting: Family Medicine

## 2016-08-07 VITALS — BP 158/82 | HR 77 | Temp 98.2°F | Ht 62.5 in | Wt 187.6 lb

## 2016-08-07 DIAGNOSIS — R600 Localized edema: Secondary | ICD-10-CM | POA: Diagnosis not present

## 2016-08-07 DIAGNOSIS — T148XXA Other injury of unspecified body region, initial encounter: Secondary | ICD-10-CM

## 2016-08-07 LAB — CBC WITH DIFFERENTIAL/PLATELET
Basophils Absolute: 0 10*3/uL (ref 0.0–0.1)
Basophils Relative: 0.4 % (ref 0.0–3.0)
Eosinophils Absolute: 0.1 10*3/uL (ref 0.0–0.7)
Eosinophils Relative: 2.3 % (ref 0.0–5.0)
HCT: 36.4 % (ref 36.0–46.0)
Hemoglobin: 12.3 g/dL (ref 12.0–15.0)
Lymphocytes Relative: 27.5 % (ref 12.0–46.0)
Lymphs Abs: 1.6 10*3/uL (ref 0.7–4.0)
MCHC: 33.9 g/dL (ref 30.0–36.0)
MCV: 86.2 fl (ref 78.0–100.0)
Monocytes Absolute: 0.6 10*3/uL (ref 0.1–1.0)
Monocytes Relative: 9.9 % (ref 3.0–12.0)
Neutro Abs: 3.5 10*3/uL (ref 1.4–7.7)
Neutrophils Relative %: 59.9 % (ref 43.0–77.0)
Platelets: 211 10*3/uL (ref 150.0–400.0)
RBC: 4.22 Mil/uL (ref 3.87–5.11)
RDW: 12.8 % (ref 11.5–15.5)
WBC: 5.8 10*3/uL (ref 4.0–10.5)

## 2016-08-07 LAB — COMPREHENSIVE METABOLIC PANEL
ALT: 19 U/L (ref 0–35)
AST: 16 U/L (ref 0–37)
Albumin: 3.8 g/dL (ref 3.5–5.2)
Alkaline Phosphatase: 108 U/L (ref 39–117)
BUN: 32 mg/dL — ABNORMAL HIGH (ref 6–23)
CO2: 33 mEq/L — ABNORMAL HIGH (ref 19–32)
Calcium: 10.5 mg/dL (ref 8.4–10.5)
Chloride: 103 mEq/L (ref 96–112)
Creatinine, Ser: 1.02 mg/dL (ref 0.40–1.20)
GFR: 57.12 mL/min — ABNORMAL LOW (ref >60.00)
Glucose, Bld: 101 mg/dL — ABNORMAL HIGH (ref 70–99)
Potassium: 4.3 mEq/L (ref 3.5–5.1)
Sodium: 141 mEq/L (ref 135–145)
Total Bilirubin: 0.4 mg/dL (ref 0.2–1.2)
Total Protein: 6.6 g/dL (ref 6.0–8.3)

## 2016-08-07 LAB — POCT INR: INR: 1

## 2016-08-07 NOTE — Progress Notes (Signed)
Tricia [name] is a 71 y.o. female here for an acute visit.  History of Present Illness:   Shaune Pascal CMA acting as scribe for Dr. Juleen China.  HPI Patient comes in today due to lower extremity edema and bruising all over the body. Patient stated that the edema started while she was on a cruise boat. It seems that the edema in localized in the lower legs and feet. Patient also has bruises all over the body in different locations such as lower back, arms, and legs. She denies any falls that would cause the bruising. She noticed the bruising before the cruise. Patient states that she has been on Plavix for almost a year.   PMHx, SurgHx, SocialHx, Medications, and Allergies were reviewed in the Visit Navigator and updated as appropriate.  Current Medications:   Current Outpatient Prescriptions:  .  albuterol (PROVENTIL HFA;VENTOLIN HFA) 108 (90 Base) MCG/ACT inhaler, Inhale 2 puffs into the lungs every 6 (six) hours as needed for wheezing or shortness of breath., Disp: 1 Inhaler, Rfl: 3 .  amLODipine (NORVASC) 5 MG tablet, Take 1 tablet (5 mg total) by mouth daily., Disp: 90 tablet, Rfl: 3 .  aspirin EC 81 MG tablet, Take 81 mg by mouth daily., Disp: , Rfl:  .  atorvastatin (LIPITOR) 80 MG tablet, TAKE 1 TABLET (80 MG TOTAL) BY MOUTH DAILY., Disp: 90 tablet, Rfl: 3 .  Biotin 10000 MCG TABS, Take 10,000 mcg by mouth daily. Dissolvable tablets, Disp: , Rfl:  .  blood glucose meter kit and supplies KIT, Dispense based on patient and insurance preference. Use to test daily. (FOR ICD-9 250.00, 250.01). 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  blood glucose meter kit and supplies, Dispense based on patient and insurance preference. Use up to four times daily as directed. (FOR ICD-9 250.00, 250.01)., Disp: 1 each, Rfl: 0 .  carvedilol (COREG) 6.25 MG tablet, Take 1 tablet (6.25 mg total) by mouth 2 (two) times daily with a meal., Disp: 180 tablet, Rfl: 3 .  Cholecalciferol (VITAMIN D3) 2000 units  CHEW, Chew 2,000 Units by mouth daily., Disp: , Rfl:  .  clopidogrel (PLAVIX) 75 MG tablet, TAKE 1 TABLET (75 MG TOTAL) BY MOUTH DAILY., Disp: 30 tablet, Rfl: 11 .  diazepam (VALIUM) 2 MG tablet, Take 1 tablet (2 mg total) by mouth every 6 (six) hours as needed for anxiety., Disp: 30 tablet, Rfl: 0 .  glucose blood (FREESTYLE LITE) test strip, TEST UP TO FOUR TIMES DAILY Dx: E11.22, Disp: 360 each, Rfl: 5 .  HYDROcodone-acetaminophen (NORCO) 10-325 MG tablet, Take 1 tablet by mouth every 8 (eight) hours as needed., Disp: 30 tablet, Rfl: 0 .  Lancets (FREESTYLE) lancets, TEST UP TO FOUR TIMES DAILY Dx: E11.22, Disp: 360 each, Rfl: 5 .  Multiple Vitamin (MULTIVITAMIN WITH MINERALS) TABS tablet, Take 1 tablet by mouth daily. Women's One a Day, Disp: , Rfl:  .  nitroGLYCERIN (NITROLINGUAL) 0.4 MG/SPRAY spray, Place 1 spray under the tongue every 5 (five) minutes as needed for chest pain., Disp: 4.9 g, Rfl: 3 .  nitroGLYCERIN (NITROSTAT) 0.4 MG SL tablet, Place 1 tablet (0.4 mg total) under the tongue every 5 (five) minutes as needed for chest pain., Disp: 50 tablet, Rfl: 0 .  pantoprazole (PROTONIX) 40 MG tablet, TAKE 1 TABLET (40 MG TOTAL) BY MOUTH DAILY., Disp: 30 tablet, Rfl: 11 .  tiZANidine (ZANAFLEX) 2 MG tablet, Take by mouth every 8 (eight) hours as needed for muscle spasms., Disp: ,  Rfl:  .  valsartan (DIOVAN) 320 MG tablet, TAKE 1 TABLET (320 MG TOTAL) BY MOUTH DAILY., Disp: 30 tablet, Rfl: 11   Allergies  Allergen Reactions  . Sulfa Antibiotics Diarrhea and Nausea And Vomiting   Review of Systems:   Review of Systems  Constitutional: Negative for chills, fever, malaise/fatigue and weight loss.  Respiratory: Negative for cough, shortness of breath and wheezing.   Cardiovascular: Positive for leg swelling. Negative for chest pain and palpitations.  Gastrointestinal: Negative for abdominal pain, constipation, diarrhea, nausea and vomiting.  Genitourinary: Negative for dysuria and urgency.   Musculoskeletal: Negative for joint pain and myalgias.  Skin: Negative for rash.  Neurological: Negative for dizziness and headaches.  Endo/Heme/Allergies: Bruises/bleeds easily.  Psychiatric/Behavioral: Negative for depression, substance abuse and suicidal ideas. The patient is not nervous/anxious.    Vitals:   Vitals:   08/07/16 1433  BP: (!) 158/82  Pulse: 77  Temp: 98.2 F (36.8 C)  TempSrc: Oral  SpO2: 96%  Weight: 187 lb 9.6 oz (85.1 kg)  Height: 5' 2.5" (1.588 m)     Body mass index is 33.77 kg/m.  Physical Exam:   Physical Exam  Constitutional: She is oriented to person, place, and time. She appears well-developed and well-nourished. No distress.  HENT:  Head: Normocephalic and atraumatic.  Eyes: EOM are normal. Pupils are equal, round, and reactive to light.  Neck: Normal range of motion. Neck supple.  Cardiovascular: Normal rate, regular rhythm, normal heart sounds and intact distal pulses.   Pulmonary/Chest: Effort normal and breath sounds normal.  Abdominal: Soft.  Musculoskeletal: She exhibits edema.  Neurological: She is alert and oriented to person, place, and time.  Skin: Skin is warm. Bruising noted.  Psychiatric: She has a normal mood and affect. Her behavior is normal.  Nursing note and vitals reviewed.   Results for orders placed or performed in visit on 08/07/16  CBC with Differential/Platelet  Result Value Ref Range   WBC 5.8 4.0 - 10.5 K/uL   RBC 4.22 3.87 - 5.11 Mil/uL   Hemoglobin 12.3 12.0 - 15.0 g/dL   HCT 36.4 36.0 - 46.0 %   MCV 86.2 78.0 - 100.0 fl   MCHC 33.9 30.0 - 36.0 g/dL   RDW 12.8 11.5 - 15.5 %   Platelets 211.0 150.0 - 400.0 K/uL   Neutrophils Relative % 59.9 43.0 - 77.0 %   Lymphocytes Relative 27.5 12.0 - 46.0 %   Monocytes Relative 9.9 3.0 - 12.0 %   Eosinophils Relative 2.3 0.0 - 5.0 %   Basophils Relative 0.4 0.0 - 3.0 %   Neutro Abs 3.5 1.4 - 7.7 K/uL   Lymphs Abs 1.6 0.7 - 4.0 K/uL   Monocytes Absolute 0.6 0.1 -  1.0 K/uL   Eosinophils Absolute 0.1 0.0 - 0.7 K/uL   Basophils Absolute 0.0 0.0 - 0.1 K/uL  Comprehensive metabolic panel  Result Value Ref Range   Sodium 141 135 - 145 mEq/L   Potassium 4.3 3.5 - 5.1 mEq/L   Chloride 103 96 - 112 mEq/L   CO2 33 (H) 19 - 32 mEq/L   Glucose, Bld 101 (H) 70 - 99 mg/dL   BUN 32 (H) 6 - 23 mg/dL   Creatinine, Ser 1.02 0.40 - 1.20 mg/dL   Total Bilirubin 0.4 0.2 - 1.2 mg/dL   Alkaline Phosphatase 108 39 - 117 U/L   AST 16 0 - 37 U/L   ALT 19 0 - 35 U/L   Total Protein 6.6 6.0 -  8.3 g/dL   Albumin 3.8 3.5 - 5.2 g/dL   Calcium 10.5 8.4 - 10.5 mg/dL   GFR 57.12 (L) >60.00 mL/min  POCT INR  Result Value Ref Range   INR 1.0    Assessment and Plan:   Mavery was seen today for acute visit.  Diagnoses and all orders for this visit:  Localized edema Comments: Lower extremity edema. Bilateral. Labs WNL. Rx Lasix. Precautions reviewed. Orders: -     CBC with Differential/Platelet -     Comprehensive metabolic panel -     POCT INR -     furosemide (LASIX) 20 MG tablet; Take one by mouth daily as needed for edema. -     potassium chloride SA (K-DUR,KLOR-CON) 20 MEQ tablet; Take with Lasix dose.  Bruising Comments: Diffuse. Started while on a cruise. Likely due to trauma that the patient does not recall.  Orders: -     CBC with Differential/Platelet -     Comprehensive metabolic panel -     Cancel: Protime-INR; Future -     POCT INR    . Reviewed expectations re: course of current medical issues. . Discussed self-management of symptoms. . Outlined signs and symptoms indicating need for more acute intervention. . Patient verbalized understanding and all questions were answered. Marland Kitchen Health Maintenance issues including appropriate healthy diet, exercise, and smoking avoidance were discussed with patient. . See orders for this visit as documented in the electronic medical record. . Patient received an After Visit Summary.  CMA served as Education administrator  during this visit. History, Physical, and Plan performed by medical provider. The above documentation has been reviewed and is accurate and complete. Briscoe Deutscher, D.O.  Briscoe Deutscher, DO [facility] 08/09/2016  No future appointments.

## 2016-08-07 NOTE — Telephone Encounter (Signed)
Noted  

## 2016-08-07 NOTE — Telephone Encounter (Signed)
Patient has bruises on her body and is unsure why they are there, some are painful. Patient is scheduled today to see Dr. Juleen China at 2:30pm.

## 2016-08-08 MED ORDER — POTASSIUM CHLORIDE CRYS ER 20 MEQ PO TBCR: EXTENDED_RELEASE_TABLET | ORAL | 1 refills | Status: DC

## 2016-08-08 MED ORDER — FUROSEMIDE 20 MG PO TABS: ORAL_TABLET | ORAL | 1 refills | Status: DC

## 2016-08-09 ENCOUNTER — Encounter: Payer: Self-pay | Admitting: Family Medicine

## 2016-09-09 ENCOUNTER — Encounter: Payer: Self-pay | Admitting: Physician Assistant

## 2016-09-09 ENCOUNTER — Ambulatory Visit (INDEPENDENT_AMBULATORY_CARE_PROVIDER_SITE_OTHER): Payer: PPO

## 2016-09-09 ENCOUNTER — Ambulatory Visit (INDEPENDENT_AMBULATORY_CARE_PROVIDER_SITE_OTHER): Payer: PPO | Admitting: Physician Assistant

## 2016-09-09 ENCOUNTER — Telehealth: Payer: Self-pay | Admitting: Family Medicine

## 2016-09-09 VITALS — BP 150/80 | HR 60 | Temp 98.2°F | Ht 62.5 in | Wt 187.5 lb

## 2016-09-09 DIAGNOSIS — R0602 Shortness of breath: Secondary | ICD-10-CM

## 2016-09-09 DIAGNOSIS — K219 Gastro-esophageal reflux disease without esophagitis: Secondary | ICD-10-CM

## 2016-09-09 DIAGNOSIS — I252 Old myocardial infarction: Secondary | ICD-10-CM

## 2016-09-09 NOTE — Telephone Encounter (Signed)
Patient has acid relfux. Sched acute tomorrow 08/08 at 8:15am.  -LL

## 2016-09-09 NOTE — Progress Notes (Signed)
Tricia [name] is a 71 y.o. female here for acid reflux.  I acted as a Education administrator for [employer], PA-C Anselmo Pickler, LPN  History of Present Illness:   Chief Complaint  Patient presents with  . Gastroesophageal Reflux    burning in throat    Gastroesophageal Reflux  She complains of heartburn and a sore throat. She reports no abdominal pain, no chest pain or no nausea. burning. This is a chronic problem. Episode onset: Since Sunday. The problem occurs constantly. The problem has been gradually worsening. The symptoms are aggravated by lying down (Has been sleeping sitting up since Sunday). Associated symptoms include fatigue. Pertinent negatives include no weight loss. She has tried a PPI, head elevation and an antacid (Has taken tums and maalox) for the symptoms. The treatment provided no relief.   Lying down makes symptoms worsening. Had some indigestion after [place] on Saturday. Last month took a cruise. No chest pain but does endorse shortness of breath with activity. No unusual HA's or vision changes. Has had some lower leg swelling recently, her PCP started on lasix that she has been using prn and has been helping significantly with this. Former smoker.   Blood sugars -- 88-89 a couple days ago, does not check daily.   Past Medical History:  Diagnosis Date  . Adenomatous polyp of colon 02/2004  . Anemia   . Anxiety   . Arthritis   . B12 deficiency   . CAD (coronary artery disease)    a. s/p remote BMS to LAD;  b. 09/2015 Inf STEMI/VF Arrest: LM nl, LAD 85p (staged PCI 2 days later w/ 3.0x32 Synergy DES), 40p/m, 25d, LCX 16m RCA 80ost/100p (2.75x32 Synergy DES), 636mEF 55-65%. // c. Myoview 2/18: EF 59, no ischemia or infarction; Normal study  . Depression   . Dermatophytosis of groin and perianal area   . Diet Controlled Diabetes Mellitus   . Diverticulosis   . H/O echocardiogram    a. 09/2015 Echo: EF 60-65%, no rwma, mild AI, mildly dil RA, mild to mod TR.  . Marland KitchenHyperlipidemia   . Hypertensive heart disease   . Low back pain    l5 disc  . Morbid obesity ([place]  . Osteoporosis   . Sleep apnea 10/03/2009   Resolved after gastric bypass    . TOBACCO ABUSE 10/05/2009   Qualifier: Diagnosis of  By: CrStanford BreedMD, FAKandyce Rud . Ventricular fibrillation ([place]CEng[place]08/28/2017   a. In setting of inferior STEMI.     Social History   Social History  . Marital status: Married    Spouse name: N/A  . Number of children: N/A  . Years of education: N/A   Occupational History  . retired Unemployed   Social History Main Topics  . Smoking status: Former Smoker    Packs/day: 2.00    Years: 41.00    Types: Cigarettes    Quit date: 03/06/2008  . Smokeless tobacco: Never Used  . Alcohol use No  . Drug use: No  . Sexual activity: Yes   Other Topics Concern  . Not on file   Social History Narrative   Widowed in 04/2012. 2 children. 3 grandkids.    Lives alone. Completely indendent.       Disabled/retired. Disabled-lifted computer paper      Hobbies: spend money-gamble    Past Surgical History:  Procedure Laterality Date  . ABDOMINAL HYSTERECTOMY    . APPENDECTOMY    . BARIATRIC SURGERY    .  CARDIAC CATHETERIZATION N/A 10/01/2015   Procedure: Left Heart Cath and Coronary Angiography;  Surgeon: Leonie Man, MD;  Location: [facility];  Service: Cardiovascular;  Laterality: N/A;  . CARDIAC CATHETERIZATION N/A 10/01/2015   Procedure: Coronary Stent Intervention;  Surgeon: Leonie Man, MD;  Location: [facility];  Service: Cardiovascular;  Laterality: N/A;  . CARDIAC CATHETERIZATION N/A 10/03/2015   Procedure: Coronary Stent Intervention;  Surgeon: Troy Sine, MD;  Location: [facility];  Service: Cardiovascular;  Laterality: N/A;  . CARDIAC CATHETERIZATION N/A 10/03/2015   Procedure: Coronary/Graft Angiography;  Surgeon: Troy Sine, MD;  Location: [facility];  Service: Cardiovascular;  Laterality: N/A;   . CARPAL TUNNEL RELEASE     bilateral  . CERVICAL DISC SURGERY    . CHOLECYSTECTOMY    . CORONARY ANGIOPLASTY  2002   2 times  . HERNIA REPAIR    . LUMBAR LAMINECTOMY     l3-4  . NEPHRECTOMY     partial  . TONSILLECTOMY    . TOTAL KNEE ARTHROPLASTY     L  . TUBAL LIGATION    . ulnar nerve release     and thumb surgery    Family History  Problem Relation Age of Onset  . Colitis Mother        sepsis from c dif colitis  . Heart disease Father   . Coronary artery disease Unknown   . Stomach cancer Maternal Uncle     Allergies  Allergen Reactions  . Sulfa Antibiotics Diarrhea and Nausea And Vomiting    Current Medications:   Current Outpatient Prescriptions:  .  albuterol (PROVENTIL HFA;VENTOLIN HFA) 108 (90 Base) MCG/ACT inhaler, Inhale 2 puffs into the lungs every 6 (six) hours as needed for wheezing or shortness of breath., Disp: 1 Inhaler, Rfl: 3 .  aspirin EC 81 MG tablet, Take 81 mg by mouth daily., Disp: , Rfl:  .  atorvastatin (LIPITOR) 80 MG tablet, TAKE 1 TABLET (80 MG TOTAL) BY MOUTH DAILY., Disp: 90 tablet, Rfl: 3 .  Biotin 10000 MCG TABS, Take 10,000 mcg by mouth daily. Dissolvable tablets, Disp: , Rfl:  .  blood glucose meter kit and supplies KIT, Dispense based on patient and insurance preference. Use to test daily. (FOR ICD-9 250.00, 250.01). 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  blood glucose meter kit and supplies, Dispense based on patient and insurance preference. Use up to four times daily as directed. (FOR ICD-9 250.00, 250.01)., Disp: 1 each, Rfl: 0 .  carvedilol (COREG) 6.25 MG tablet, Take 1 tablet (6.25 mg total) by mouth 2 (two) times daily with a meal., Disp: 180 tablet, Rfl: 3 .  Cholecalciferol (VITAMIN D3) 2000 units CHEW, Chew 2,000 Units by mouth daily., Disp: , Rfl:  .  clopidogrel (PLAVIX) 75 MG tablet, TAKE 1 TABLET (75 MG TOTAL) BY MOUTH DAILY., Disp: 30 tablet, Rfl: 11 .  diazepam (VALIUM) 2 MG tablet, Take 1 tablet (2 mg total)  by mouth every 6 (six) hours as needed for anxiety., Disp: 30 tablet, Rfl: 0 .  furosemide (LASIX) 20 MG tablet, Take one by mouth daily as needed for edema., Disp: 20 tablet, Rfl: 1 .  glucose blood (FREESTYLE LITE) test strip, TEST UP TO FOUR TIMES DAILY Dx: E11.22, Disp: 360 each, Rfl: 5 .  HYDROcodone-acetaminophen (NORCO) 10-325 MG tablet, Take 1 tablet by mouth every 8 (eight) hours as needed., Disp: 30 tablet, Rfl: 0 .  Lancets (  FREESTYLE) lancets, TEST UP TO FOUR TIMES DAILY Dx: E11.22, Disp: 360 each, Rfl: 5 .  Multiple Vitamin (MULTIVITAMIN WITH MINERALS) TABS tablet, Take 1 tablet by mouth daily. Women's One a Day, Disp: , Rfl:  .  nitroGLYCERIN (NITROLINGUAL) 0.4 MG/SPRAY spray, Place 1 spray under the tongue every 5 (five) minutes as needed for chest pain., Disp: 4.9 g, Rfl: 3 .  nitroGLYCERIN (NITROSTAT) 0.4 MG SL tablet, Place 1 tablet (0.4 mg total) under the tongue every 5 (five) minutes as needed for chest pain., Disp: 50 tablet, Rfl: 0 .  pantoprazole (PROTONIX) 40 MG tablet, TAKE 1 TABLET (40 MG TOTAL) BY MOUTH DAILY., Disp: 30 tablet, Rfl: 11 .  potassium chloride SA (K-DUR,KLOR-CON) 20 MEQ tablet, Take with Lasix dose., Disp: 20 tablet, Rfl: 1 .  tiZANidine (ZANAFLEX) 2 MG tablet, Take by mouth every 8 (eight) hours as needed for muscle spasms., Disp: , Rfl:  .  valsartan (DIOVAN) 320 MG tablet, TAKE 1 TABLET (320 MG TOTAL) BY MOUTH DAILY., Disp: 30 tablet, Rfl: 11 .  amLODipine (NORVASC) 5 MG tablet, Take 1 tablet (5 mg total) by mouth daily., Disp: 90 tablet, Rfl: 3   Review of Systems:   Review of Systems  Constitutional: Positive for fatigue. Negative for chills, fever, malaise/fatigue and weight loss.  HENT: Positive for sore throat.   Respiratory: Negative for sputum production and shortness of breath.   Cardiovascular: Positive for leg swelling. Negative for chest pain.  Gastrointestinal: Positive for heartburn. Negative for abdominal pain, constipation, diarrhea,  nausea and vomiting.  Neurological: Negative for dizziness and headaches.    Vitals:   Vitals:   09/09/16 1330 09/09/16 1420  BP: (!) 160/90 (!) 150/80  Pulse: 64 60  Temp: 98.2 F (36.8 C)   TempSrc: Oral   SpO2: 97%   Weight: 187 lb 8 oz (85 kg)   Height: 5' 2.5" (1.588 m)      Body mass index is 33.75 kg/m.  Physical Exam:   Physical Exam  Constitutional: She appears well-developed. She is cooperative.  Non-toxic appearance. She does not have a sickly appearance. She does not appear ill. No distress.  Cardiovascular: Normal rate, regular rhythm, S1 normal, S2 normal, normal heart sounds and normal pulses.   No LE edema  Pulmonary/Chest: Effort normal and breath sounds normal.  Abdominal: Normal appearance and bowel sounds are normal. There is no tenderness. There is no rigidity, no rebound, no guarding and no CVA tenderness.  Neurological: She is alert. GCS eye subscore is 4. GCS verbal subscore is 5. GCS motor subscore is 6.  Skin: Skin is warm, dry and intact.  Psychiatric: She has a normal mood and affect. Her speech is normal and behavior is normal.  Nursing note and vitals reviewed.  EKG tracing is personally reviewed.  EKG notes NSR.  No acute changes.   CLINICAL DATA:  Shortness of breath, productive cough.  EXAM: CHEST  2 VIEW  COMPARISON:  Radiographs of April 29, 2016.  FINDINGS: The heart size and mediastinal contours are within normal limits. Atherosclerosis of thoracic aorta is noted. No pneumothorax or pleural effusion is noted. No acute pulmonary disease is noted. The visualized skeletal structures are unremarkable.  IMPRESSION: No active cardiopulmonary disease.  Aortic atherosclerosis.   Electronically Signed   By: Marijo Conception, M.D.   On: 09/09/2016 16:48  Assessment and Plan:    Rylynne was seen today for gastroesophageal reflux.  Diagnoses and all orders for this visit:  History of  ST elevation myocardial infarction  (STEMI) Performed EKG. Reviewed with Dr. Teresa Coombs and spoke with Dr. Tamala Julian from [facility] regarding EKG. No acute changes. Given hx of STEMI, low threshold to send to ER. Discussed warning signs and red flags, including chest pain and worsening shortness of breath.  -     EKG 12-Lead -     DG Chest 2 View; Future  Gastroesophageal reflux disease, esophagitis presence not specified She is currently on protonix 40 mg and has been on this for years. She reports that her throat discomfort is atypical and she has post-nasal drip with productive cough. Discussed trialing Flonase and an antihistamine, as well as possibly changing protonix to nexium. She will let us know if her symptoms worsen or do not improve with treatment. -     EKG 12-Lead -     DG Chest 2 View; Future  SOB (shortness of breath) Chest xray normal. Continue prn lasix. Possible early viral URI. Discussed symptomatic care. Follow-up if symptoms worsen or persist. -     DG Chest 2 View; Future    . Reviewed expectations re: course of current medical issues. . Discussed self-management of symptoms. . Outlined signs and symptoms indicating need for more acute intervention. . Patient verbalized understanding and all questions were answered. . See orders for this visit as documented in the electronic medical record. . Patient received an After-Visit Summary.  CMA or LPN served as scribe during this visit. History, Physical, and Plan performed by medical provider. Documentation and orders reviewed and attested to.  Inda Coke, PA-C

## 2016-09-09 NOTE — Telephone Encounter (Signed)
Patient rescheduled to see Aldona Bar today at 1:30pm

## 2016-09-09 NOTE — Patient Instructions (Signed)
It was great to meet you! Start Flonase in bilateral nasal passages twice a day. Start Zyrtec.  Consider trying Nexium instead of the protonix. We will call you with your chest xray results. If you develop chest pain, shortness of breath, severe abdominal pain, lightheadedness/dizziness - please go to the ER!

## 2016-09-10 ENCOUNTER — Ambulatory Visit: Payer: PPO | Admitting: Family Medicine

## 2016-09-11 ENCOUNTER — Telehealth: Payer: Self-pay | Admitting: Family Medicine

## 2016-09-11 ENCOUNTER — Ambulatory Visit (INDEPENDENT_AMBULATORY_CARE_PROVIDER_SITE_OTHER): Payer: PPO | Admitting: Physician Assistant

## 2016-09-11 ENCOUNTER — Encounter: Payer: Self-pay | Admitting: Physician Assistant

## 2016-09-11 VITALS — BP 200/100 | HR 82 | Temp 100.0°F | Ht 62.5 in | Wt 185.4 lb

## 2016-09-11 DIAGNOSIS — J069 Acute upper respiratory infection, unspecified: Secondary | ICD-10-CM

## 2016-09-11 DIAGNOSIS — I10 Essential (primary) hypertension: Secondary | ICD-10-CM

## 2016-09-11 DIAGNOSIS — M545 Low back pain: Secondary | ICD-10-CM

## 2016-09-11 DIAGNOSIS — R309 Painful micturition, unspecified: Secondary | ICD-10-CM | POA: Diagnosis not present

## 2016-09-11 DIAGNOSIS — G8929 Other chronic pain: Secondary | ICD-10-CM | POA: Diagnosis not present

## 2016-09-11 LAB — URINALYSIS, ROUTINE W REFLEX MICROSCOPIC
Bilirubin Urine: NEGATIVE
Ketones, ur: NEGATIVE
Leukocytes, UA: NEGATIVE
Nitrite: POSITIVE — AB
Specific Gravity, Urine: 1.01 (ref 1.000–1.030)
Urine Glucose: NEGATIVE
Urobilinogen, UA: 0.2 (ref 0.0–1.0)
pH: 6 (ref 5.0–8.0)

## 2016-09-11 MED ORDER — BENZONATATE 200 MG PO CAPS: 200.0000 mg | ORAL_CAPSULE | Freq: Two times a day (BID) | ORAL | 0 refills | Status: DC | PRN

## 2016-09-11 MED ORDER — DOXYCYCLINE HYCLATE 100 MG PO TABS: 100.0000 mg | ORAL_TABLET | Freq: Two times a day (BID) | ORAL | 0 refills | Status: DC

## 2016-09-11 NOTE — Telephone Encounter (Signed)
Patient Name: Tricia [name]  DOB: 23-May-1945    Initial Comment Caller States she has a temp of 101, coughing   Nurse Assessment  Nurse: Raphael Gibney, RN, Vera Date/Time (Eastern Time): 09/11/2016 7:23:04 AM  Confirm and document reason for call. If symptomatic, describe symptoms. ---Caller states she is coughing. Temp 101. Saw doctor on Tuesday. No fever on Tuesday. She was instructed to take Flonase and zyrtec. She is coughing up green sputum.  Does the patient have any new or worsening symptoms? ---Yes  Will a triage be completed? ---Yes  Related visit to physician within the last 2 weeks? ---Yes  Does the PT have any chronic conditions? (i.e. diabetes, asthma, etc.) ---Yes  List chronic conditions. ---diabetes; HTN; cardiac stent  Is this a behavioral health or substance abuse call? ---No     Guidelines    Guideline Title Affirmed Question Affirmed Notes  Cough - Acute Productive [1] Fever > 100.0 F (37.8 C) AND [2] diabetes mellitus or weak immune system (e.g., HIV positive, cancer chemo, splenectomy, chronic steroids)    Final Disposition User   See Physician within 4 Hours (or PCP triage) Raphael Gibney, RN, Vera    Comments  appt scheduled for 09/11/2016 at 8:45 am with Inda Coke   Referrals  REFERRED TO PCP OFFICE   Disagree/Comply: Leta Baptist

## 2016-09-11 NOTE — Progress Notes (Signed)
Tricia [name] is a 71 y.o. female here for cough and fever.  I acted as a Education administrator for [employer], PA-C Anselmo Pickler, LPN  History of Present Illness:   Chief Complaint  Patient presents with  . Cough    x 2 days  . Fever    101 this morning   Patient was seen by me 2 days ago for GERD. She had a negative cardiac workup at the time. She was started on Flonase as well as Zyrtec, and this has not helped with her symptoms. She has not developed fever with productive cough.  Cough  This is a new problem. Episode onset: x 2 days, fever at 4:00 AM this mornig was 101  The problem has been gradually worsening. The problem occurs every few hours. The cough is productive of sputum (Green sputum). Associated symptoms include chills, a fever, heartburn, nasal congestion, postnasal drip, a sore throat and wheezing. Pertinent negatives include no chest pain or ear pain. The symptoms are aggravated by lying down. She has tried rest and OTC cough suppressant (Cough drops, Motrin 400 mg at 7:15 this morning) for the symptoms. The treatment provided mild relief. Her past medical history is significant for COPD.   She has not taken any of her blood pressure medication today. She denies any symptoms of high blood pressure including chest pain, shortness of breath, blurred vision, lower leg swelling.  She also endorses right-sided back pain. 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 She denies any prior history of pyelonephritis. This pain flares up occasionally and this feels like her normal pain.  Chest x-ray was performed at last visit 2 days ago, was normal without any evidence of cardiopulmonary disease.  Past Medical History:  Diagnosis Date  . Adenomatous polyp of colon 02/2004  . Anemia   . Anxiety   . Arthritis   . B12 deficiency   . CAD (coronary artery disease)    a. s/p remote BMS to LAD;  b.  09/2015 Inf STEMI/VF Arrest: LM nl, LAD 85p (staged PCI 2 days later w/ 3.0x32 Synergy DES), 40p/m, 25d, LCX 50m RCA 80ost/100p (2.75x32 Synergy DES), 647mEF 55-65%. // c. Myoview 2/18: EF 59, no ischemia or infarction; Normal study  . Depression   . Dermatophytosis of groin and perianal area   . Diet Controlled Diabetes Mellitus   . Diverticulosis   . H/O echocardiogram    a. 09/2015 Echo: EF 60-65%, no rwma, mild AI, mildly dil RA, mild to mod TR.  . Marland Kitchenyperlipidemia   . Hypertensive heart disease   . Low back pain    l5 disc  . Morbid obesity ([facility]  . Osteoporosis   . Sleep apnea 10/03/2009   Resolved after gastric bypass    . TOBACCO ABUSE 10/05/2009   Qualifier: Diagnosis of  By: CrStanford BreedMD, FAKandyce Rud . Ventricular fibrillation ([facility]08/28/2017   a. In setting of inferior STEMI.     Social History   Social History  . Marital status: Married    Spouse name: N/A  . Number of children: N/A  . Years of education: N/A   Occupational History  . retired Unemployed   Social History Main Topics  . Smoking status: Former Smoker    Packs/day: 2.00    Years: 41.00    Types: Cigarettes    Quit date: 03/06/2008  .  Smokeless tobacco: Never Used  . Alcohol use No  . Drug use: No  . Sexual activity: Yes   Other Topics Concern  . Not on file   Social History Narrative   Widowed in 04/2012. 2 children. 3 grandkids.    Lives alone. Completely indendent.       Disabled/retired. Disabled-lifted computer paper      Hobbies: spend money-gamble    Past Surgical History:  Procedure Laterality Date  . ABDOMINAL HYSTERECTOMY    . APPENDECTOMY    . BARIATRIC SURGERY    . CARDIAC CATHETERIZATION N/A 10/01/2015   Procedure: Left Heart Cath and Coronary Angiography;  Surgeon: Leonie Man, MD;  Location: [facility];  Service: Cardiovascular;  Laterality: N/A;  . CARDIAC CATHETERIZATION N/A 10/01/2015   Procedure: Coronary Stent Intervention;  Surgeon: Leonie Man, MD;  Location: [facility];  Service: Cardiovascular;  Laterality: N/A;  . CARDIAC CATHETERIZATION N/A 10/03/2015   Procedure: Coronary Stent Intervention;  Surgeon: Troy Sine, MD;  Location: [facility];  Service: Cardiovascular;  Laterality: N/A;  . CARDIAC CATHETERIZATION N/A 10/03/2015   Procedure: Coronary/Graft Angiography;  Surgeon: Troy Sine, MD;  Location: [facility];  Service: Cardiovascular;  Laterality: N/A;  . CARPAL TUNNEL RELEASE     bilateral  . CERVICAL DISC SURGERY    . CHOLECYSTECTOMY    . CORONARY ANGIOPLASTY  2002   2 times  . HERNIA REPAIR    . LUMBAR LAMINECTOMY     l3-4  . NEPHRECTOMY     partial  . TONSILLECTOMY    . TOTAL KNEE ARTHROPLASTY     L  . TUBAL LIGATION    . ulnar nerve release     and thumb surgery    Family History  Problem Relation Age of Onset  . Colitis Mother        sepsis from c dif colitis  . Heart disease Father   . Coronary artery disease Unknown   . Stomach cancer Maternal Uncle     Allergies  Allergen Reactions  . Sulfa Antibiotics Diarrhea and Nausea And Vomiting    Current Medications:   Current Outpatient Prescriptions:  .  albuterol (PROVENTIL HFA;VENTOLIN HFA) 108 (90 Base) MCG/ACT inhaler, Inhale 2 puffs into the lungs every 6 (six) hours as needed for wheezing or shortness of breath., Disp: 1 Inhaler, Rfl: 3 .  aspirin EC 81 MG tablet, Take 81 mg by mouth daily., Disp: , Rfl:  .  atorvastatin (LIPITOR) 80 MG tablet, TAKE 1 TABLET (80 MG TOTAL) BY MOUTH DAILY., Disp: 90 tablet, Rfl: 3 .  Biotin 10000 MCG TABS, Take 10,000 mcg by mouth daily. Dissolvable tablets, Disp: , Rfl:  .  blood glucose meter kit and supplies KIT, Dispense based on patient and insurance preference. Use to test daily. (FOR ICD-9 250.00, 250.01). 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  blood glucose meter kit and supplies, Dispense based on patient and insurance preference. Use up to four times daily as  directed. (FOR ICD-9 250.00, 250.01)., Disp: 1 each, Rfl: 0 .  carvedilol (COREG) 6.25 MG tablet, Take 1 tablet (6.25 mg total) by mouth 2 (two) times daily with a meal., Disp: 180 tablet, Rfl: 3 .  Cholecalciferol (VITAMIN D3) 2000 units CHEW, Chew 2,000 Units by mouth daily., Disp: , Rfl:  .  clopidogrel (PLAVIX) 75 MG tablet, TAKE 1 TABLET (75 MG TOTAL) BY MOUTH DAILY., Disp: 30 tablet, Rfl:  11 .  diazepam (VALIUM) 2 MG tablet, Take 1 tablet (2 mg total) by mouth every 6 (six) hours as needed for anxiety., Disp: 30 tablet, Rfl: 0 .  furosemide (LASIX) 20 MG tablet, Take one by mouth daily as needed for edema., Disp: 20 tablet, Rfl: 1 .  glucose blood (FREESTYLE LITE) test strip, TEST UP TO FOUR TIMES DAILY Dx: E11.22, Disp: 360 each, Rfl: 5 .  HYDROcodone-acetaminophen (NORCO) 10-325 MG tablet, Take 1 tablet by mouth every 8 (eight) hours as needed., Disp: 30 tablet, Rfl: 0 .  Lancets (FREESTYLE) lancets, TEST UP TO FOUR TIMES DAILY Dx: E11.22, Disp: 360 each, Rfl: 5 .  Multiple Vitamin (MULTIVITAMIN WITH MINERALS) TABS tablet, Take 1 tablet by mouth daily. Women's One a Day, Disp: , Rfl:  .  nitroGLYCERIN (NITROLINGUAL) 0.4 MG/SPRAY spray, Place 1 spray under the tongue every 5 (five) minutes as needed for chest pain., Disp: 4.9 g, Rfl: 3 .  nitroGLYCERIN (NITROSTAT) 0.4 MG SL tablet, Place 1 tablet (0.4 mg total) under the tongue every 5 (five) minutes as needed for chest pain., Disp: 50 tablet, Rfl: 0 .  pantoprazole (PROTONIX) 40 MG tablet, TAKE 1 TABLET (40 MG TOTAL) BY MOUTH DAILY., Disp: 30 tablet, Rfl: 11 .  potassium chloride SA (K-DUR,KLOR-CON) 20 MEQ tablet, Take with Lasix dose., Disp: 20 tablet, Rfl: 1 .  tiZANidine (ZANAFLEX) 2 MG tablet, Take by mouth every 8 (eight) hours as needed for muscle spasms., Disp: , Rfl:  .  valsartan (DIOVAN) 320 MG tablet, TAKE 1 TABLET (320 MG TOTAL) BY MOUTH DAILY., Disp: 30 tablet, Rfl: 11 .  amLODipine (NORVASC) 5 MG tablet, Take 1 tablet (5 mg  total) by mouth daily., Disp: 90 tablet, Rfl: 3 .  benzonatate (TESSALON) 200 MG capsule, Take 1 capsule (200 mg total) by mouth 2 (two) times daily as needed for cough., Disp: 20 capsule, Rfl: 0 .  doxycycline (VIBRA-TABS) 100 MG tablet, Take 1 tablet (100 mg total) by mouth 2 (two) times daily., Disp: 20 tablet, Rfl: 0   Review of Systems:   Review of Systems  Constitutional: Positive for chills and fever.  HENT: Positive for postnasal drip and sore throat. Negative for ear pain.   Respiratory: Positive for cough and wheezing.   Cardiovascular: Negative for chest pain.  Gastrointestinal: Positive for heartburn.    Vitals:   Vitals:   09/11/16 0901 09/11/16 0915  BP: (!) 190/100 (!) 200/100  Pulse: 88 82  Temp: 100 F (37.8 C)   TempSrc: Oral   SpO2: 96%   Weight: 185 lb 6.1 oz (84.1 kg)   Height: 5' 2.5" (1.588 m)      Body mass index is 33.37 kg/m.  Physical Exam:   Physical Exam  Constitutional: She appears well-developed. She is cooperative.  Non-toxic appearance. She does not have a sickly appearance. She does not appear ill. No distress.  HENT:  Head: Normocephalic and atraumatic.  Right Ear: Tympanic membrane, external ear and ear canal normal. Tympanic membrane is not erythematous, not retracted and not bulging.  Left Ear: Tympanic membrane, external ear and ear canal normal. Tympanic membrane is not erythematous, not retracted and not bulging.  Nose: Mucosal edema and rhinorrhea present. Right sinus exhibits no maxillary sinus tenderness and no frontal sinus tenderness. Left sinus exhibits no maxillary sinus tenderness and no frontal sinus tenderness.  Mouth/Throat: Uvula is midline and mucous membranes are normal. Posterior oropharyngeal erythema present. No posterior oropharyngeal edema. Tonsils are 1+ on the right.  Tonsils are 1+ on the left. No tonsillar exudate.  Eyes: Conjunctivae and lids are normal.  Neck: Trachea normal.  Cardiovascular: Normal rate,  regular rhythm, S1 normal, S2 normal, normal heart sounds and normal pulses.   No LE edema  Pulmonary/Chest: Effort normal and breath sounds normal. She has no decreased breath sounds. She has no wheezes. She has no rhonchi. She has no rales.  Abdominal: There is CVA tenderness (R sided).  Lymphadenopathy:    She has no cervical adenopathy.  Neurological: She is alert. GCS eye subscore is 4. GCS verbal subscore is 5. GCS motor subscore is 6.  Skin: Skin is warm, dry and intact.  Psychiatric: She has a normal mood and affect. Her speech is normal and behavior is normal.  Nursing note and vitals reviewed.    Assessment and Plan:    Tricia [name] was seen today for cough and fever.  Diagnoses and all orders for this visit:  Upper respiratory tract infection, unspecified type Patient has developed fevers since I last saw her. I suspect that she is developing a upper respiratory infection. No shortness of breath and oxygen saturation is stable, no indication for repeat chest x-ray today. Will start doxycycline and Tessalon capsules per orders. I advised her to follow-up with [place] if she has worsening symptoms or any changes in her symptoms.  Essential HTN She reports that she did not take her blood pressure medications today because her power without this morning. She is going to take the medicine if she gets home. He is also going to recheck her blood pressure after she takes her medications and call us if remains significantly elevated. I asked her to go to the ER if she develops any chest pain, shortness of breath, or severe headache.  Chronic right-sided low back pain, with sciatica presence unspecified She is endorsing right-sided back pain, reports that this is common for her. No prior history of pyelonephritis however given her age, and fever will rule out urinary infection with UA. Follow-up if symptoms continue to persist. I have also ordered a urine culture for patient. -     Urinalysis,  Routine w reflex microscopic  Other orders -     doxycycline (VIBRA-TABS) 100 MG tablet; Take 1 tablet (100 mg total) by mouth 2 (two) times daily. -     benzonatate (TESSALON) 200 MG capsule; Take 1 capsule (200 mg total) by mouth 2 (two) times daily as needed for cough.   . Reviewed expectations re: course of current medical issues. . Discussed self-management of symptoms. . Outlined signs and symptoms indicating need for more acute intervention. . Patient verbalized understanding and all questions were answered. . See orders for this visit as documented in the electronic medical record. . Patient received an After-Visit Summary.  CMA or LPN served as scribe during this visit. History, Physical, and Plan performed by medical provider. Documentation and orders reviewed and attested to.  Inda Coke, PA-C

## 2016-09-11 NOTE — Patient Instructions (Signed)
It was great to see you!  Start the antibiotic and the cough medicine.  Take cough medicine with water, do not crush or chew capsules.  Follow-up if fever worsens, or you develop chest pain or shortness of breath.

## 2016-09-14 ENCOUNTER — Encounter: Payer: Self-pay | Admitting: Family Medicine

## 2016-09-14 ENCOUNTER — Other Ambulatory Visit: Payer: Self-pay | Admitting: Physician Assistant

## 2016-09-14 LAB — URINE CULTURE

## 2016-09-14 MED ORDER — AMOXICILLIN-POT CLAVULANATE 875-125 MG PO TABS: 1.0000 | ORAL_TABLET | Freq: Two times a day (BID) | ORAL | 0 refills | Status: AC

## 2016-09-16 ENCOUNTER — Other Ambulatory Visit: Payer: Self-pay | Admitting: Surgical

## 2016-09-16 MED ORDER — FLUTICASONE PROPIONATE 50 MCG/ACT NA SUSP: 2.0000 | Freq: Every day | NASAL | 0 refills | Status: DC

## 2016-09-16 MED ORDER — CETIRIZINE HCL 10 MG PO TABS: 10.0000 mg | ORAL_TABLET | Freq: Every day | ORAL | 0 refills | Status: DC

## 2016-09-16 NOTE — Progress Notes (Signed)
Patient wanted a prescription for Flonase and zyrtec sent to the pharmacy due to OTC was to expensive. Sent prescriptions to pharmacy.

## 2016-09-18 ENCOUNTER — Telehealth: Payer: Self-pay | Admitting: Family Medicine

## 2016-09-18 NOTE — Telephone Encounter (Signed)
[facility] called in reference to needing diagnosis code for urine culture. Please call and advise. Date of service 09/11/16

## 2016-09-22 NOTE — Telephone Encounter (Signed)
Please Advise

## 2016-09-22 NOTE — Telephone Encounter (Signed)
Per Aldona Bar use Dysuria for DX code R30.9. Called [facility] to give DX code.

## 2016-10-14 ENCOUNTER — Other Ambulatory Visit: Payer: Self-pay | Admitting: Family Medicine

## 2016-10-29 ENCOUNTER — Other Ambulatory Visit: Payer: Self-pay | Admitting: Nurse Practitioner

## 2016-10-29 ENCOUNTER — Other Ambulatory Visit: Payer: Self-pay | Admitting: Family Medicine

## 2016-11-11 ENCOUNTER — Telehealth: Payer: Self-pay | Admitting: Family Medicine

## 2016-11-11 DIAGNOSIS — M150 Primary generalized (osteo)arthritis: Principal | ICD-10-CM

## 2016-11-11 DIAGNOSIS — M159 Polyosteoarthritis, unspecified: Secondary | ICD-10-CM

## 2016-11-11 DIAGNOSIS — M8949 Other hypertrophic osteoarthropathy, multiple sites: Secondary | ICD-10-CM

## 2016-11-11 NOTE — Telephone Encounter (Signed)
MEDICATION: HYDROcodone-acetaminophen (NORCO) 10-325 MG tablet  PHARMACY:   [id number] - [place], [place] - [address] [phone] (Phone) [phone] (Fax)     IS THIS A 90 DAY SUPPLY : N  IS PATIENT OUT OF MEDICATION: N  IF NOT; HOW MUCH IS LEFT: 2-3 left  LAST APPOINTMENT DATE: @9 /12/2016  NEXT APPOINTMENT DATE:@10 /11/2016  OTHER COMMENTS:    **Let patient know to contact pharmacy at the end of the day to make sure medication is ready. **  ** Please notify patient to allow 48-72 hours to process**  **Encourage patient to contact the pharmacy for refills or they can request refills through [facility]**

## 2016-11-11 NOTE — Telephone Encounter (Signed)
Okay refill. Noted that one Rx usually last around 6 months.

## 2016-11-11 NOTE — Telephone Encounter (Signed)
Please advise on refill.

## 2016-11-12 ENCOUNTER — Ambulatory Visit (INDEPENDENT_AMBULATORY_CARE_PROVIDER_SITE_OTHER): Payer: PPO | Admitting: *Deleted

## 2016-11-12 DIAGNOSIS — Z23 Encounter for immunization: Secondary | ICD-10-CM

## 2016-11-12 MED ORDER — HYDROCODONE-ACETAMINOPHEN 10-325 MG PO TABS: 1.0000 | ORAL_TABLET | Freq: Three times a day (TID) | ORAL | 0 refills | Status: DC | PRN

## 2016-11-12 NOTE — Telephone Encounter (Signed)
RX has been printed and placed up front to pick up. Will call the patient this morning to pick up.

## 2016-11-12 NOTE — Telephone Encounter (Signed)
Notified patient that RX is up front to pick up.

## 2016-11-27 ENCOUNTER — Other Ambulatory Visit: Payer: Self-pay | Admitting: Cardiovascular Disease

## 2016-12-22 DIAGNOSIS — H2513 Age-related nuclear cataract, bilateral: Secondary | ICD-10-CM | POA: Diagnosis not present

## 2016-12-22 DIAGNOSIS — E119 Type 2 diabetes mellitus without complications: Secondary | ICD-10-CM | POA: Diagnosis not present

## 2016-12-23 ENCOUNTER — Telehealth: Payer: Self-pay | Admitting: Family Medicine

## 2016-12-23 NOTE — Telephone Encounter (Signed)
Patient called inquiring about who her Prolia injection was billed through and that when Rachel Bo was here he took care of this information. I advised the patient that Rachel Bo is no longer with [facility] and I encouraged her to call the number on the bill she received to assist.  I spoke with Cassie who advised that Cleda Clarks (althought Dr. Ansel Bong LPN) may be able to speak to the patient to further assist due to working with [facility] [employer]. Please advise if possible.

## 2017-01-07 NOTE — Telephone Encounter (Signed)
Patient is scheduled for tomorrow morning and will bring her card of approval from the [employer] to cover the cost.

## 2017-01-07 NOTE — Telephone Encounter (Signed)
Called and spoke with patient. She has been approved by the [employer]. I am scheduling her to come in and get her injection. She was due in October.

## 2017-01-08 ENCOUNTER — Telehealth: Payer: Self-pay | Admitting: Family Medicine

## 2017-01-08 ENCOUNTER — Ambulatory Visit (INDEPENDENT_AMBULATORY_CARE_PROVIDER_SITE_OTHER): Payer: PPO | Admitting: Surgical

## 2017-01-08 ENCOUNTER — Encounter: Payer: Self-pay | Admitting: Surgical

## 2017-01-08 DIAGNOSIS — M816 Localized osteoporosis [Lequesne]: Secondary | ICD-10-CM

## 2017-01-08 MED ORDER — DENOSUMAB 60 MG/ML ~~LOC~~ SOLN
60.0000 mg | Freq: Once | SUBCUTANEOUS | Status: AC
  Administered 2017-01-08: 60 mg via SUBCUTANEOUS

## 2017-01-08 NOTE — Telephone Encounter (Signed)
I advised that patient to contact the pharmacy to check to see if her Lot number was the one that was recalled.

## 2017-01-08 NOTE — Telephone Encounter (Signed)
Patient brought in paperwork stating that her  amLODipine (NORVASC) 5 MG tablet(Expired) valsartan (DIOVAN) 320 MG tablet have been recalled. Patient needs to know what alternatives that she needs to take due to the recall. Call to advise.

## 2017-01-08 NOTE — Progress Notes (Signed)
Patient came in today for her Prolia injection. Injection given in in left arm. Patient tolerated well. Next Prolia injection due 07/09/16. Patient will go ahead and schedule appointment.

## 2017-01-08 NOTE — Progress Notes (Signed)
I have reviewed the patient's encounter and agree with the documentation.  Algis Greenhouse. Jerline Pain, MD 01/08/2017 12:59 PM

## 2017-01-18 ENCOUNTER — Other Ambulatory Visit: Payer: Self-pay | Admitting: Nurse Practitioner

## 2017-02-05 ENCOUNTER — Telehealth: Payer: Self-pay | Admitting: Family Medicine

## 2017-02-05 NOTE — Telephone Encounter (Signed)
Copied from [place] [id number]. Topic: General - Other >> Feb 05, 2017 11:52 AM Lolita Rieger, RMA wrote: Reason for CRM: pt called and wanted an order for an MRI placed she stated that it was discussed during her last visit Please contact pt

## 2017-02-05 NOTE — Telephone Encounter (Signed)
Please contact patient to advise °

## 2017-02-05 NOTE — Telephone Encounter (Signed)
Please advise on ordering MRI.

## 2017-02-06 NOTE — Telephone Encounter (Signed)
Patient is seeing Dr. Para March on Tuesday so she will get him to order this. I did schedule for a follow up for next Friday. She is wanting to discuss the scan for AA and wants an A1c.

## 2017-02-06 NOTE — Telephone Encounter (Signed)
It has been so long since I saw her, I'm not sure what she is wanting. There is already an MRI order for shoulder in the chart. Needs visit for follow up if the MRI shoulder is what she wants. Otherwise, needs new appointment prior to ordering a different image.

## 2017-02-10 DIAGNOSIS — M545 Low back pain: Secondary | ICD-10-CM | POA: Diagnosis not present

## 2017-02-11 ENCOUNTER — Ambulatory Visit: Payer: PPO

## 2017-02-13 ENCOUNTER — Ambulatory Visit: Payer: PPO | Admitting: Family Medicine

## 2017-02-18 ENCOUNTER — Telehealth: Payer: Self-pay | Admitting: Family Medicine

## 2017-02-18 ENCOUNTER — Ambulatory Visit (INDEPENDENT_AMBULATORY_CARE_PROVIDER_SITE_OTHER): Payer: PPO | Admitting: Family Medicine

## 2017-02-18 ENCOUNTER — Ambulatory Visit (INDEPENDENT_AMBULATORY_CARE_PROVIDER_SITE_OTHER): Payer: PPO

## 2017-02-18 VITALS — BP 152/88 | HR 50 | Temp 97.5°F | Wt 208.2 lb

## 2017-02-18 DIAGNOSIS — Z87891 Personal history of nicotine dependence: Secondary | ICD-10-CM | POA: Diagnosis not present

## 2017-02-18 DIAGNOSIS — G8929 Other chronic pain: Secondary | ICD-10-CM

## 2017-02-18 DIAGNOSIS — M545 Low back pain: Secondary | ICD-10-CM

## 2017-02-18 DIAGNOSIS — E1121 Type 2 diabetes mellitus with diabetic nephropathy: Secondary | ICD-10-CM | POA: Diagnosis not present

## 2017-02-18 DIAGNOSIS — M48061 Spinal stenosis, lumbar region without neurogenic claudication: Secondary | ICD-10-CM | POA: Diagnosis not present

## 2017-02-18 DIAGNOSIS — M159 Polyosteoarthritis, unspecified: Secondary | ICD-10-CM

## 2017-02-18 DIAGNOSIS — M5441 Lumbago with sciatica, right side: Secondary | ICD-10-CM

## 2017-02-18 DIAGNOSIS — M150 Primary generalized (osteo)arthritis: Secondary | ICD-10-CM

## 2017-02-18 DIAGNOSIS — Z135 Encounter for screening for eye and ear disorders: Secondary | ICD-10-CM

## 2017-02-18 DIAGNOSIS — Z23 Encounter for immunization: Secondary | ICD-10-CM | POA: Diagnosis not present

## 2017-02-18 DIAGNOSIS — M8949 Other hypertrophic osteoarthropathy, multiple sites: Secondary | ICD-10-CM

## 2017-02-18 LAB — COMPREHENSIVE METABOLIC PANEL
ALT: 17 U/L (ref 0–35)
AST: 13 U/L (ref 0–37)
Albumin: 3.9 g/dL (ref 3.5–5.2)
Alkaline Phosphatase: 133 U/L — ABNORMAL HIGH (ref 39–117)
BUN: 32 mg/dL — ABNORMAL HIGH (ref 6–23)
CO2: 30 mEq/L (ref 19–32)
Calcium: 9.1 mg/dL (ref 8.4–10.5)
Chloride: 103 mEq/L (ref 96–112)
Creatinine, Ser: 0.91 mg/dL (ref 0.40–1.20)
GFR: 65.06 mL/min (ref >60.00)
Glucose, Bld: 156 mg/dL — ABNORMAL HIGH (ref 70–99)
Potassium: 4.3 mEq/L (ref 3.5–5.1)
Sodium: 140 mEq/L (ref 135–145)
Total Bilirubin: 0.6 mg/dL (ref 0.2–1.2)
Total Protein: 6.3 g/dL (ref 6.0–8.3)

## 2017-02-18 LAB — CBC WITH DIFFERENTIAL/PLATELET
Basophils Absolute: 0 10*3/uL (ref 0.0–0.1)
Basophils Relative: 0.3 % (ref 0.0–3.0)
Eosinophils Absolute: 0.1 10*3/uL (ref 0.0–0.7)
Eosinophils Relative: 0.9 % (ref 0.0–5.0)
HCT: 40.8 % (ref 36.0–46.0)
Hemoglobin: 13.3 g/dL (ref 12.0–15.0)
Lymphocytes Relative: 19.6 % (ref 12.0–46.0)
Lymphs Abs: 2 10*3/uL (ref 0.7–4.0)
MCHC: 32.5 g/dL (ref 30.0–36.0)
MCV: 84.2 fl (ref 78.0–100.0)
Monocytes Absolute: 0.6 10*3/uL (ref 0.1–1.0)
Monocytes Relative: 6.1 % (ref 3.0–12.0)
Neutro Abs: 7.4 10*3/uL (ref 1.4–7.7)
Neutrophils Relative %: 73.1 % (ref 43.0–77.0)
Platelets: 358 10*3/uL (ref 150.0–400.0)
RBC: 4.85 Mil/uL (ref 3.87–5.11)
RDW: 12.3 % (ref 11.5–15.5)
WBC: 10.2 10*3/uL (ref 4.0–10.5)

## 2017-02-18 LAB — POCT GLYCOSYLATED HEMOGLOBIN (HGB A1C): Hemoglobin A1C: 6

## 2017-02-18 MED ORDER — GABAPENTIN 100 MG PO CAPS: 100.0000 mg | ORAL_CAPSULE | Freq: Three times a day (TID) | ORAL | 1 refills | Status: DC

## 2017-02-18 MED ORDER — HYDROCODONE-ACETAMINOPHEN 10-325 MG PO TABS: 1.0000 | ORAL_TABLET | Freq: Three times a day (TID) | ORAL | 0 refills | Status: DC | PRN

## 2017-02-18 NOTE — Telephone Encounter (Signed)
The patient requests that we take "smoker" off of her future AVS sheets.

## 2017-02-18 NOTE — Telephone Encounter (Signed)
Please advise 

## 2017-02-19 ENCOUNTER — Encounter: Payer: Self-pay | Admitting: Family Medicine

## 2017-02-19 ENCOUNTER — Other Ambulatory Visit: Payer: Self-pay | Admitting: Family Medicine

## 2017-02-19 DIAGNOSIS — M25511 Pain in right shoulder: Secondary | ICD-10-CM | POA: Diagnosis not present

## 2017-02-19 DIAGNOSIS — Z1231 Encounter for screening mammogram for malignant neoplasm of breast: Secondary | ICD-10-CM

## 2017-02-21 ENCOUNTER — Encounter: Payer: Self-pay | Admitting: Family Medicine

## 2017-02-21 NOTE — Progress Notes (Signed)
Tricia [name] is a 72 y.o. female is here for follow up.  History of Present Illness:   HPI: See Assessment and Plan section for Problem Based Charting of issues discussed today.  Health Maintenance Due  Topic Date Due  . OPHTHALMOLOGY EXAM  07/04/2016   Depression screen PHQ 2/9 03/10/2016 12/03/2015 11/29/2015  Decreased Interest 0 0 0  Down, Depressed, Hopeless 0 0 0  PHQ - 2 Score 0 0 0  Altered sleeping - - -  Tired, decreased energy - - -  Change in appetite - - -  Feeling bad or failure about yourself  - - -  Trouble concentrating - - -  Moving slowly or fidgety/restless - - -  Suicidal thoughts - - -  PHQ-9 Score - - -  Difficult doing work/chores - - -  Some recent data might be hidden   PMHx, SurgHx, SocialHx, FamHx, Medications, and Allergies were reviewed in the Visit Navigator and updated as appropriate.   Patient Active Problem List   Diagnosis Date Noted  . Hyperparathyroidism, secondary ([place]) 07/06/2016  . Right renal atrophy 04/19/2016  . COPD (chronic obstructive pulmonary disease) ([place]) 04/19/2016  . Aortic atherosclerosis ([place]) 04/19/2016  . Atherosclerosis of arteries 04/19/2016  . Sleep apnea   . Diverticulosis   . Depression   . Anxiety   . Coronary artery disease involving native coronary artery of native heart without angina pectoris 03/10/2016  . Rib pain - s/p CPR 10/08/2015  . History of cardiac arrest 10/01/2015  . History of ST elevation myocardial infarction (STEMI) 10/01/2015  . Second hand smoke exposure 07/28/2015  . Postoperative malabsorption 07/27/2015  . Recurrent UTI 03/14/2014  . GERD (gastroesophageal reflux disease) 02/14/2014  . History of gastric bypass 02/14/2014  . Former smoker 02/14/2014  . Diabetes mellitus type II, controlled ([place]) 02/14/2014  . Anxiety state 10/03/2009  . Anemia, B12 deficiency 04/04/2009  . Osteoporosis 12/14/2006  . Hyperlipidemia 09/22/2006  . Essential hypertension 09/22/2006  .  Osteoarthritis 09/22/2006  . Low back pain 09/22/2006  . Adenomatous polyp of colon 02/04/2004   Social History   Tobacco Use  . Smoking status: Former Smoker    Packs/day: 2.00    Years: 41.00    Pack years: 82.00    Types: Cigarettes    Last attempt to quit: 03/06/2008    Years since quitting: 8.9  . Smokeless tobacco: Never Used  Substance Use Topics  . Alcohol use: No    Alcohol/week: 0.0 oz  . Drug use: No   Current Medications and Allergies:   Current Outpatient Medications:  .  albuterol (PROVENTIL HFA;VENTOLIN HFA) 108 (90 Base) MCG/ACT inhaler, Inhale 2 puffs into the lungs every 6 (six) hours as needed for wheezing or shortness of breath., Disp: 1 Inhaler, Rfl: 3 .  aspirin EC 81 MG tablet, Take 81 mg by mouth daily., Disp: , Rfl:  .  atorvastatin (LIPITOR) 80 MG tablet, TAKE 1 TABLET (80 MG TOTAL) BY MOUTH DAILY., Disp: 90 tablet, Rfl: 3 .  benzonatate (TESSALON) 200 MG capsule, Take 1 capsule (200 mg total) by mouth 2 (two) times daily as needed for cough., Disp: 20 capsule, Rfl: 0 .  Biotin 10000 MCG TABS, Take 10,000 mcg by mouth daily. Dissolvable tablets, Disp: , Rfl:  .  blood glucose meter kit and supplies KIT, Dispense based on patient and insurance preference. Use to test daily. (FOR ICD-9 250.00, 250.01). 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  blood glucose  meter kit and supplies, Dispense based on patient and insurance preference. Use up to four times daily as directed. (FOR ICD-9 250.00, 250.01)., Disp: 1 each, Rfl: 0 .  carvedilol (COREG) 6.25 MG tablet, TAKE 1 TABLET BY MOUTH TWICE A DAY WITH MEALS, Disp: 180 tablet, Rfl: 0 .  cetirizine (ZYRTEC) 10 MG tablet, Take 1 tablet (10 mg total) by mouth daily., Disp: 30 tablet, Rfl: 0 .  Cholecalciferol (VITAMIN D3) 2000 units CHEW, Chew 2,000 Units by mouth daily., Disp: , Rfl:  .  clopidogrel (PLAVIX) 75 MG tablet, TAKE 1 TABLET (75 MG TOTAL) BY MOUTH DAILY., Disp: 30 tablet, Rfl: 11 .  diazepam (VALIUM) 2  MG tablet, Take 1 tablet (2 mg total) by mouth every 6 (six) hours as needed for anxiety., Disp: 30 tablet, Rfl: 0 .  fluticasone (FLONASE) 50 MCG/ACT nasal spray, SPRAY 2 SPRAYS INTO EACH NOSTRIL EVERY DAY, Disp: 16 g, Rfl: 0 .  furosemide (LASIX) 20 MG tablet, Take one by mouth daily as needed for edema., Disp: 20 tablet, Rfl: 1 .  glucose blood (FREESTYLE LITE) test strip, TEST UP TO FOUR TIMES DAILY Dx: E11.22, Disp: 360 each, Rfl: 5 .  HYDROcodone-acetaminophen (NORCO) 10-325 MG tablet, Take 1 tablet by mouth every 8 (eight) hours as needed., Disp: 30 tablet, Rfl: 0 .  Lancets (FREESTYLE) lancets, TEST UP TO FOUR TIMES DAILY Dx: E11.22, Disp: 360 each, Rfl: 5 .  Multiple Vitamin (MULTIVITAMIN WITH MINERALS) TABS tablet, Take 1 tablet by mouth daily. Women's One a Day, Disp: , Rfl:  .  nitroGLYCERIN (NITROLINGUAL) 0.4 MG/SPRAY spray, Place 1 spray under the tongue every 5 (five) minutes as needed for chest pain., Disp: 4.9 g, Rfl: 3 .  nitroGLYCERIN (NITROSTAT) 0.4 MG SL tablet, Place 1 tablet (0.4 mg total) under the tongue every 5 (five) minutes as needed for chest pain., Disp: 50 tablet, Rfl: 0 .  pantoprazole (PROTONIX) 40 MG tablet, TAKE 1 TABLET (40 MG TOTAL) BY MOUTH DAILY., Disp: 30 tablet, Rfl: 11 .  potassium chloride SA (K-DUR,KLOR-CON) 20 MEQ tablet, Take with Lasix dose., Disp: 20 tablet, Rfl: 1 .  tiZANidine (ZANAFLEX) 2 MG tablet, Take by mouth every 8 (eight) hours as needed for muscle spasms., Disp: , Rfl:  .  valsartan (DIOVAN) 320 MG tablet, TAKE 1 TABLET (320 MG TOTAL) BY MOUTH DAILY., Disp: 30 tablet, Rfl: 3 .  amLODipine (NORVASC) 5 MG tablet, Take 1 tablet (5 mg total) by mouth daily., Disp: 90 tablet, Rfl: 3 .  gabapentin (NEURONTIN) 100 MG capsule, Take 1 capsule (100 mg total) by mouth 3 (three) times daily., Disp: 90 capsule, Rfl: 1   Allergies  Allergen Reactions  . Sulfa Antibiotics Diarrhea and Nausea And Vomiting   Review of Systems   Pertinent items are  noted in the HPI. Otherwise, ROS is negative.  Vitals:   Vitals:   02/18/17 1456  BP: (!) 152/88  Pulse: (!) 50  Temp: (!) 97.5 F (36.4 C)  SpO2: 97%  Weight: 208 lb 3.2 oz (94.4 kg)     Body mass index is 37.47 kg/m.   Physical Exam:   Physical Exam  Constitutional: She is oriented to person, place, and time. She appears well-developed and well-nourished. No distress.  HENT:  Head: Normocephalic and atraumatic.  Right Ear: External ear normal.  Left Ear: External ear normal.  Nose: Nose normal.  Mouth/Throat: Oropharynx is clear and moist.  Eyes: Conjunctivae and EOM are normal. Pupils are equal, round, and reactive to   light.  Neck: Normal range of motion. Neck supple. No thyromegaly present.  Cardiovascular: Normal rate, regular rhythm, normal heart sounds and intact distal pulses.  Pulmonary/Chest: Effort normal and breath sounds normal.  Abdominal: Soft. Bowel sounds are normal.  Musculoskeletal: Normal range of motion.  Lymphadenopathy:    She has no cervical adenopathy.  Neurological: She is alert and oriented to person, place, and time.  Skin: Skin is warm and dry. Capillary refill takes less than 2 seconds.  Psychiatric: She has a normal mood and affect. Her behavior is normal.  Nursing note and vitals reviewed.    Results for orders placed or performed in visit on 02/18/17  CBC with Differential/Platelet  Result Value Ref Range   WBC 10.2 4.0 - 10.5 K/uL   RBC 4.85 3.87 - 5.11 Mil/uL   Hemoglobin 13.3 12.0 - 15.0 g/dL   HCT 40.8 36.0 - 46.0 %   MCV 84.2 78.0 - 100.0 fl   MCHC 32.5 30.0 - 36.0 g/dL   RDW 12.3 11.5 - 15.5 %   Platelets 358.0 150.0 - 400.0 K/uL   Neutrophils Relative % 73.1 43.0 - 77.0 %   Lymphocytes Relative 19.6 12.0 - 46.0 %   Monocytes Relative 6.1 3.0 - 12.0 %   Eosinophils Relative 0.9 0.0 - 5.0 %   Basophils Relative 0.3 0.0 - 3.0 %   Neutro Abs 7.4 1.4 - 7.7 K/uL   Lymphs Abs 2.0 0.7 - 4.0 K/uL   Monocytes Absolute 0.6 0.1 -  1.0 K/uL   Eosinophils Absolute 0.1 0.0 - 0.7 K/uL   Basophils Absolute 0.0 0.0 - 0.1 K/uL  Comprehensive metabolic panel  Result Value Ref Range   Sodium 140 135 - 145 mEq/L   Potassium 4.3 3.5 - 5.1 mEq/L   Chloride 103 96 - 112 mEq/L   CO2 30 19 - 32 mEq/L   Glucose, Bld 156 (H) 70 - 99 mg/dL   BUN 32 (H) 6 - 23 mg/dL   Creatinine, Ser 0.91 0.40 - 1.20 mg/dL   Total Bilirubin 0.6 0.2 - 1.2 mg/dL   Alkaline Phosphatase 133 (H) 39 - 117 U/L   AST 13 0 - 37 U/L   ALT 17 0 - 35 U/L   Total Protein 6.3 6.0 - 8.3 g/dL   Albumin 3.9 3.5 - 5.2 g/dL   Calcium 9.1 8.4 - 10.5 mg/dL   GFR 65.06 >60.00 mL/min  POCT glycosylated hemoglobin (Hb A1C)  Result Value Ref Range   Hemoglobin A1C 6.0    EXAM: LUMBAR SPINE - 2-3 VIEW  COMPARISON:  February 10, 2013  FINDINGS: Frontal, lateral, and spot lumbosacral lateral images were obtained. There are 5 non-rib-bearing lumbar type vertebral bodies. There is lower lumbar levoscoliosis. There is no fracture. There is 7 mm of anterolisthesis of L4 on L5, stable from prior study. No new spondylolisthesis. There is slight disc space narrowing at L4-5. Other disc spaces appear normal. No erosive change. There is aortoiliac atherosclerosis.  IMPRESSION: Stable spondylolisthesis at L4-5. No fracture. Lower lumbar scoliosis, stable. Mild disc space narrowing L4-5.  There is aortic atherosclerosis.  Aortic Atherosclerosis (ICD10-I70.0).  Assessment and Plan:   Karey was seen today for back pain.  Diagnoses and all orders for this visit:  Controlled type 2 diabetes mellitus with diabetic nephropathy, without long-term current use of insulin (HCC) Comments: Current symptoms: no polyuria or polydipsia, no chest pain, dyspnea or TIA's, no unusual visual symptoms.  Maintaining a diabetic diet? []  YES  [x]    NO Trying to exercise on a regular basis? []  YES  [x]  NO  On ACE inhibitor or angiotensin II receptor blocker? [x]  YES  []  NO On  Aspirin? [x]  YES  []  NO  Lab Results  Component Value Date   HGBA1C 6.0 02/18/2017     Lab Results  Component Value Date   CHOL 122 06/18/2016   HDL 51.20 06/18/2016   LDLCALC 54 06/18/2016   LDLDIRECT 86.0 09/11/2015   TRIG 82.0 06/18/2016   CHOLHDL 2 06/18/2016     Wt Readings from Last 3 Encounters:  02/18/17 208 lb 3.2 oz (94.4 kg)  09/11/16 185 lb 6.1 oz (84.1 kg)  09/09/16 187 lb 8 oz (85 kg)   Lab Results  Component Value Date   CREATININE 0.91 02/18/2017   Recommendations: 1.  Patient is counseled on appropriate foot care. 2.  BP goal < 130/80. 3.  LDL goal of < 100, HDL > 40 and TG < 150.  4.  Eye Exam yearly and Dental Exam every 6 months. 5.  Dietary recommendations:  ADA 6.  Physical Activity recommendations:  Daily movement as tolerated. 7.  Pneumovax at diagnosis and once 65+. Wait five years between first dose and dose after 65.  8.  Influenza annually.   Diabetes Health Maintenance Due  Topic Date Due  . OPHTHALMOLOGY EXAM  07/04/2016  . FOOT EXAM  04/02/2017  . HEMOGLOBIN A1C  08/18/2017   Diabetes QM Metrics Latest Ref Rng & Units 02/18/2017 06/25/2016 06/18/2016 03/05/2016  HbA1c - 6.0 5.1 - -  LDL Cacl 0 - 99 mg/dL - - 54 63  Some recent data might be hidden   Orders: -     POCT glycosylated hemoglobin (Hb A1C) -     CBC with Differential/Platelet -     Comprehensive metabolic panel  Primary osteoarthritis involving multiple joints Comments: Pt warned that strong pain medication has side effects.  Watch for sedation and any alteration in mentation. Pt to stop medication if having inappropriate side effects. Also, all pain medications can have habit forming qualities: patient is aware of this information.  Orders: -     HYDROcodone-acetaminophen (NORCO) 10-325 MG tablet; Take 1 tablet by mouth every 8 (eight) hours as needed.  Chronic low back pain, unspecified back pain laterality, with sciatica presence unspecified Comments: Patient  complains of chronic low back pain. The patient first noted symptoms several years ago. The pain is rated mild, moderate, and is located at the across the lower back or radiating to right leg(s). The pain is described as aching, stiffness and tingling and occurs all day. The symptoms has been progressive. Treatment efforts have included rest, OTC NSAIDS, acetaminophen, muscle relaxers, PT and home exercises, with and without relief.  Orders: -     DG Lumbar Spine 2-3 Views; Future -     gabapentin (NEURONTIN) 100 MG capsule; Take 1 capsule (100 mg total) by mouth 3 (three) times daily.  Former smoker -     [place] Screening AAA; Future - requested by patient  Glaucoma screening -     Ambulatory referral to Ophthalmology  Need for zoster vaccination -     Varicella-zoster vaccine IM (Shingrix)   . Reviewed expectations re: course of current medical issues. . Discussed self-management of symptoms. . Outlined signs and symptoms indicating need for more acute intervention. . Patient verbalized understanding and all questions were answered. . Health Maintenance issues including appropriate healthy diet, exercise, and   smoking avoidance were discussed with patient. . See orders for this visit as documented in the electronic medical record. . Patient received an After Visit Summary.  Erica Wallace, DO [place] 02/21/2017  Future Appointments  Date Time Provider Department Center  03/27/2017  9:40 AM [facility] 2 [facility]  07/02/2017  7:20 AM Wallace, Erica, DO [facility]  07/02/2017  8:00 AM Drummond, Cassandra J, RN [facility]  07/09/2017  9:00 AM [facility] NURSE [facility]      

## 2017-02-24 DIAGNOSIS — M4155 Other secondary scoliosis, thoracolumbar region: Secondary | ICD-10-CM | POA: Diagnosis not present

## 2017-02-24 DIAGNOSIS — M549 Dorsalgia, unspecified: Secondary | ICD-10-CM | POA: Diagnosis not present

## 2017-02-24 DIAGNOSIS — M542 Cervicalgia: Secondary | ICD-10-CM | POA: Diagnosis not present

## 2017-02-24 DIAGNOSIS — M5416 Radiculopathy, lumbar region: Secondary | ICD-10-CM | POA: Diagnosis not present

## 2017-02-24 DIAGNOSIS — M5136 Other intervertebral disc degeneration, lumbar region: Secondary | ICD-10-CM | POA: Diagnosis not present

## 2017-02-24 DIAGNOSIS — M47816 Spondylosis without myelopathy or radiculopathy, lumbar region: Secondary | ICD-10-CM | POA: Diagnosis not present

## 2017-02-24 DIAGNOSIS — M546 Pain in thoracic spine: Secondary | ICD-10-CM | POA: Diagnosis not present

## 2017-02-26 ENCOUNTER — Other Ambulatory Visit: Payer: Self-pay | Admitting: Physician Assistant

## 2017-02-26 ENCOUNTER — Telehealth: Payer: Self-pay | Admitting: *Deleted

## 2017-02-26 ENCOUNTER — Other Ambulatory Visit: Payer: Self-pay | Admitting: Cardiovascular Disease

## 2017-02-26 NOTE — Telephone Encounter (Signed)
Notification routed via Epic to requesting office.    Sent to scheduling to arrange f/u appt.

## 2017-02-26 NOTE — Telephone Encounter (Signed)
   Tricia [name] Pre-operative Risk Assessment    Request for surgical clearance:  1. What type of surgery is being performed?  Right Shoulder Scope   2. When is this surgery scheduled?  Pending   3. What type of clearance is required (medical clearance vs. Pharmacy clearance to hold med vs. Both)?  Both  4. Are there any medications that need to be held prior to surgery and how long? Plavix  5. Practice name and name of physician performing surgery?  [facility]   6. What is your office phone and fax number?  Office: [phone] Fax: [phone], Attn: Tricia [name]   7. Anesthesia type (None, local, MAC, general) ?    Other:  Please send most recent notes, labs, ekg, or special studies back with this clearance   Tricia [name] L 02/26/2017, 1:15 PM  _________________________________________________________________   (provider comments below)

## 2017-02-26 NOTE — Telephone Encounter (Signed)
   Primary Cardiologist: Dr Burt Knack  Chart reviewed as part of pre-operative protocol coverage. Because of Tricia [name]'s past medical history and time since last visit, he/she will require a follow-up visit in order to better assess preoperative cardiovascular risk.  Pre-op covering staff: - Please schedule appointment and call patient to inform them. - Please contact requesting surgeon's office via preferred method (i.e, phone, fax) to inform them of need for appointment prior to surgery.  Kerin Ransom, PA-C  02/26/2017, 1:41 PM

## 2017-03-02 ENCOUNTER — Encounter: Payer: Self-pay | Admitting: Family Medicine

## 2017-03-03 ENCOUNTER — Ambulatory Visit (INDEPENDENT_AMBULATORY_CARE_PROVIDER_SITE_OTHER): Payer: PPO | Admitting: Family Medicine

## 2017-03-03 ENCOUNTER — Encounter: Payer: Self-pay | Admitting: Family Medicine

## 2017-03-03 ENCOUNTER — Telehealth: Payer: Self-pay

## 2017-03-03 VITALS — BP 180/88 | HR 100 | Temp 98.6°F | Wt 214.8 lb

## 2017-03-03 DIAGNOSIS — K921 Melena: Secondary | ICD-10-CM | POA: Diagnosis not present

## 2017-03-03 DIAGNOSIS — R300 Dysuria: Secondary | ICD-10-CM | POA: Diagnosis not present

## 2017-03-03 NOTE — Addendum Note (Signed)
Addended by: Frutoso Chase A on: 03/03/2017 08:59 AM   Modules accepted: Orders

## 2017-03-03 NOTE — Telephone Encounter (Signed)
Patient has an appointment with Richardson Dopp PA on 03/17/17. Patient aware of appointment time and date. Sherri at [facility] is aware that patient has an appointment on 03/17/17 for clearance.

## 2017-03-03 NOTE — Telephone Encounter (Signed)
   Primary Gilliam, MD  Chart reviewed as part of pre-operative protocol coverage. Because of Dorlisa Savino Legore's past medical history and time since last visit, he/she will require a follow-up visit in order to better assess preoperative cardiovascular risk.  Pre-op covering staff: - Please schedule appointment and call patient to inform them. - Please contact requesting surgeon's office via preferred method (i.e, phone, fax) to inform them of need for appointment prior to surgery.  El Veint[place]is, [place]  03/03/2017, 1:14 PM

## 2017-03-03 NOTE — Telephone Encounter (Signed)
   [facility] Pre-operative Risk Assessment    Request for surgical clearance:  1. What type of surgery is being performed? R shoulder arthroscopy with rotator cuff and labral debridement with subacrimonial decompression and DCE   2. When is this surgery scheduled? TBD   3. What type of clearance is required (medical clearance vs. Pharmacy clearance to hold med vs. Both)? Medical  4. Are there any medications that need to be held prior to surgery and how long? None specified (patient is on ASA and Plavix)   5. Practice name and name of physician performing surgery? [facility] // Dr. Elsie Saas  6. What is your office phone and fax number?  1. Phone: [phone] 2. Fax: [phone]   7. Anesthesia type (None, local, MAC, general) ? None specified

## 2017-03-03 NOTE — Progress Notes (Signed)
Tricia [name] is a 72 y.o. female here for an acute visit.  History of Present Illness:   HPI: The patient sent a myChart message yesterday, asking if she should have a new colonoscopy due to bright red blood on her stool. See message - with picture. Gastrointestinal ROS: negative for - abdominal pain, appetite loss, change in bowel habits, constipation, diarrhea, hematemesis, melena, nausea/vomiting or stool incontinence.   PMHx, SurgHx, SocialHx, Medications, and Allergies were reviewed in the Visit Navigator and updated as appropriate.  Current Medications:   .  albuterol (PROVENTIL HFA;VENTOLIN HFA) 108 (90 Base) MCG/ACT inhaler, Inhale 2 puffs into the lungs every 6 (six) hours as needed for wheezing or shortness of breath., Disp: 1 Inhaler, Rfl: 3 .  amLODipine (NORVASC) 5 MG tablet, Take 1 tablet (5 mg total) by mouth daily. Please make yearly appt with Dr. Burt Knack for February. 1st attempt, Disp: 30 tablet, Rfl: 0 .  aspirin EC 81 MG tablet, Take 81 mg by mouth daily., Disp: , Rfl:  .  atorvastatin (LIPITOR) 80 MG tablet, TAKE 1 TABLET (80 MG TOTAL) BY MOUTH DAILY., Disp: 90 tablet, Rfl: 3 .  benzonatate (TESSALON) 200 MG capsule, Take 1 capsule (200 mg total) by mouth 2 (two) times daily as needed for cough., Disp: 20 capsule, Rfl: 0 .  Biotin 10000 MCG TABS, Take 10,000 mcg by mouth daily. Dissolvable tablets, Disp: , Rfl:  .  blood glucose meter kit and supplies KIT, Dispense based on patient and insurance preference. Use to test daily. (FOR ICD-9 250.00, 250.01). 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  blood glucose meter kit and supplies, Dispense based on patient and insurance preference. Use up to four times daily as directed. (FOR ICD-9 250.00, 250.01)., Disp: 1 each, Rfl: 0 .  carvedilol (COREG) 6.25 MG tablet, Take 1 tablet (6.25 mg total) by mouth 2 (two) times daily with a meal. Please make yearly appt with Dr. Burt Knack for February. 1st attempt, Disp: 60 tablet, Rfl:  0 .  cetirizine (ZYRTEC) 10 MG tablet, Take 1 tablet (10 mg total) by mouth daily., Disp: 30 tablet, Rfl: 0 .  Cholecalciferol (VITAMIN D3) 2000 units CHEW, Chew 2,000 Units by mouth daily., Disp: , Rfl:  .  clopidogrel (PLAVIX) 75 MG tablet, TAKE 1 TABLET (75 MG TOTAL) BY MOUTH DAILY., Disp: 30 tablet, Rfl: 11 .  diazepam (VALIUM) 2 MG tablet, Take 1 tablet (2 mg total) by mouth every 6 (six) hours as needed for anxiety., Disp: 30 tablet, Rfl: 0 .  fluticasone (FLONASE) 50 MCG/ACT nasal spray, SPRAY 2 SPRAYS INTO EACH NOSTRIL EVERY DAY, Disp: 16 g, Rfl: 0 .  furosemide (LASIX) 20 MG tablet, Take one by mouth daily as needed for edema., Disp: 20 tablet, Rfl: 1 .  gabapentin (NEURONTIN) 100 MG capsule, Take 1 capsule (100 mg total) by mouth 3 (three) times daily., Disp: 90 capsule, Rfl: 1 .  glucose blood (FREESTYLE LITE) test strip, TEST UP TO FOUR TIMES DAILY Dx: E11.22, Disp: 360 each, Rfl: 5 .  HYDROcodone-acetaminophen (NORCO) 10-325 MG tablet, Take 1 tablet by mouth every 8 (eight) hours as needed., Disp: 30 tablet, Rfl: 0 .  Lancets (FREESTYLE) lancets, TEST UP TO FOUR TIMES DAILY Dx: E11.22, Disp: 360 each, Rfl: 5 .  Multiple Vitamin (MULTIVITAMIN WITH MINERALS) TABS tablet, Take 1 tablet by mouth daily. Women's One a Day, Disp: , Rfl:  .  nitroGLYCERIN (NITROLINGUAL) 0.4 MG/SPRAY spray, Place 1 spray under the tongue every  5 (five) minutes as needed for chest pain., Disp: 4.9 g, Rfl: 3 .  nitroGLYCERIN (NITROSTAT) 0.4 MG SL tablet, Place 1 tablet (0.4 mg total) under the tongue every 5 (five) minutes as needed for chest pain., Disp: 50 tablet, Rfl: 0 .  pantoprazole (PROTONIX) 40 MG tablet, TAKE 1 TABLET (40 MG TOTAL) BY MOUTH DAILY., Disp: 30 tablet, Rfl: 11 .  potassium chloride SA (K-DUR,KLOR-CON) 20 MEQ tablet, Take with Lasix dose., Disp: 20 tablet, Rfl: 1 .  tiZANidine (ZANAFLEX) 2 MG tablet, Take by mouth every 8 (eight) hours as needed for muscle spasms., Disp: , Rfl:  .  valsartan  (DIOVAN) 320 MG tablet, TAKE 1 TABLET (320 MG TOTAL) BY MOUTH DAILY., Disp: 30 tablet, Rfl: 3   Allergies  Allergen Reactions  . Sulfa Antibiotics Diarrhea and Nausea And Vomiting   Review of Systems:   Pertinent items are noted in the HPI. Otherwise, ROS is negative.  Vitals:   Vitals:   03/03/17 0755  BP: (!) 180/88  Pulse: 100  Temp: 98.6 F (37 C)  TempSrc: Oral  SpO2: 94%  Weight: 214 lb 12.8 oz (97.4 kg)     Body mass index is 38.66 kg/m.  Physical Exam:   Physical Exam  Constitutional: She appears well-developed and well-nourished. No distress.  HENT:  Head: Normocephalic and atraumatic.  Eyes: EOM are normal. Pupils are equal, round, and reactive to light.  Neck: Normal range of motion. Neck supple.  Cardiovascular: Normal rate, regular rhythm, normal heart sounds and intact distal pulses.  Pulmonary/Chest: Effort normal.  Abdominal: Soft.  Skin: Skin is warm.  Psychiatric: She has a normal mood and affect. Her behavior is normal.  Nursing note and vitals reviewed.  Results for orders placed or performed in visit on 02/18/17  CBC with Differential/Platelet  Result Value Ref Range   WBC 10.2 4.0 - 10.5 K/uL   RBC 4.85 3.87 - 5.11 Mil/uL   Hemoglobin 13.3 12.0 - 15.0 g/dL   HCT 40.8 36.0 - 46.0 %   MCV 84.2 78.0 - 100.0 fl   MCHC 32.5 30.0 - 36.0 g/dL   RDW 12.3 11.5 - 15.5 %   Platelets 358.0 150.0 - 400.0 K/uL   Neutrophils Relative % 73.1 43.0 - 77.0 %   Lymphocytes Relative 19.6 12.0 - 46.0 %   Monocytes Relative 6.1 3.0 - 12.0 %   Eosinophils Relative 0.9 0.0 - 5.0 %   Basophils Relative 0.3 0.0 - 3.0 %   Neutro Abs 7.4 1.4 - 7.7 K/uL   Lymphs Abs 2.0 0.7 - 4.0 K/uL   Monocytes Absolute 0.6 0.1 - 1.0 K/uL   Eosinophils Absolute 0.1 0.0 - 0.7 K/uL   Basophils Absolute 0.0 0.0 - 0.1 K/uL  Comprehensive metabolic panel  Result Value Ref Range   Sodium 140 135 - 145 mEq/L   Potassium 4.3 3.5 - 5.1 mEq/L   Chloride 103 96 - 112 mEq/L   CO2 30 19  - 32 mEq/L   Glucose, Bld 156 (H) 70 - 99 mg/dL   BUN 32 (H) 6 - 23 mg/dL   Creatinine, Ser 0.91 0.40 - 1.20 mg/dL   Total Bilirubin 0.6 0.2 - 1.2 mg/dL   Alkaline Phosphatase 133 (H) 39 - 117 U/L   AST 13 0 - 37 U/L   ALT 17 0 - 35 U/L   Total Protein 6.3 6.0 - 8.3 g/dL   Albumin 3.9 3.5 - 5.2 g/dL   Calcium 9.1 8.4 - 10.5 mg/dL  GFR 65.06 >60.00 mL/min  POCT glycosylated hemoglobin (Hb A1C)  Result Value Ref Range   Hemoglobin A1C 6.0       Assessment and Plan:   1. Bloody stool History of painless BRBPR. See last colonoscopy above. Will send to GI to se if an early colonoscopy should be completed.  - Ambulatory referral to Gastroenterology  2. Right flank pain Likely MSK. Will check urine. - Urinalysis, Routine w reflex microscopic - Urine Culture   . Reviewed expectations re: course of current medical issues. . Discussed self-management of symptoms. . Outlined signs and symptoms indicating need for more acute intervention. . Patient verbalized understanding and all questions were answered. Marland Kitchen Health Maintenance issues including appropriate healthy diet, exercise, and smoking avoidance were discussed with patient. . See orders for this visit as documented in the electronic medical record. . Patient received an After Visit Summary.  Briscoe Deutscher, DO [place] 03/03/2017

## 2017-03-04 ENCOUNTER — Other Ambulatory Visit (INDEPENDENT_AMBULATORY_CARE_PROVIDER_SITE_OTHER): Payer: PPO

## 2017-03-04 DIAGNOSIS — R300 Dysuria: Secondary | ICD-10-CM

## 2017-03-04 LAB — URINALYSIS, ROUTINE W REFLEX MICROSCOPIC
Bilirubin Urine: NEGATIVE
Hgb urine dipstick: NEGATIVE
Ketones, ur: NEGATIVE
Leukocytes, UA: NEGATIVE
Nitrite: NEGATIVE
RBC / HPF: NONE SEEN (ref 0–?)
Specific Gravity, Urine: 1.015 (ref 1.000–1.030)
Urine Glucose: NEGATIVE
Urobilinogen, UA: 0.2 (ref 0.0–1.0)
pH: 8 (ref 5.0–8.0)

## 2017-03-06 LAB — URINE CULTURE
MICRO NUMBER:: 90128169
SPECIMEN QUALITY:: ADEQUATE

## 2017-03-07 MED ORDER — CEPHALEXIN 500 MG PO CAPS: 500.0000 mg | ORAL_CAPSULE | Freq: Three times a day (TID) | ORAL | 0 refills | Status: DC

## 2017-03-07 NOTE — Addendum Note (Signed)
Addended by: Briscoe Deutscher R on: 03/07/2017 03:54 PM   Modules accepted: Orders

## 2017-03-09 ENCOUNTER — Encounter: Payer: Self-pay | Admitting: Family Medicine

## 2017-03-16 NOTE — Progress Notes (Deleted)
Cardiology Office Note:    Date:  03/16/2017   ID:  NAVNEET SCHMUCK, DOB [birth year], MRN [id number]  PCP:  Briscoe Deutscher, DO  Cardiologist:  Sherren Mocha, MD  Referring MD: Briscoe Deutscher, DO   No chief complaint on file. ***  History of Present Illness:    Tricia [name] is a 72 y.o. female with a past medical history significant for CAD S/P  remote BMS to the LAD, inferior STEMI C/B V. fib arrest requiring CPR and defibrillation 09/2015, occluded RCA treated with DES and severe in-stent restenosis of the LAD treated with staged PCI and DES 09/2015, EF preserved.  She suffered multiple rib fractures due to CPR.  She attended cardiac rehab.  The patient was last seen in the office on 03/11/2016 by Richardson Dopp PA at which time she was complaining of intermittent chest pain that was relieved with sublingual nitroglycerin.  This was similar to but not as severe as her MI chest pain.  She underwent a Lexiscan Myoview on 03/25/2016 which was normal.  Her blood pressure was noted to be elevated and her ARB was at max dose and her heart rate was too slow to increase her beta-blocker.  Amlodipine 5 mg was added.  She followed up in a week at the hypertension clinic at which time her blood pressure was at goal of less than 130/80.  The patient is being seen today for pre-operative cardiology clearance for planned right shoulder arthroscopy with rotator cuff and labral debridement with subacrimonial decompression and DCE.    _________________  CAD: Status post remote stent to LAD,  inferior STEMI C/B V. fib arrest requiring CPR and defibrillation 09/2015, occluded RCA treated with DES and severe in-stent restenosis of the LAD treated with staged PCI and DES 09/2015, EF preserved. Pt with complaints of chest pain in 03/2016 and subsequent Lexiscan Myoview was normal.  She continues on aspirin 81 mg, clopidogrel 75 mg, statin, ARB. **No current exertional symptoms  Hypertension: Treated with amlodipine 5  mg, carvedilol 6.25 mg twice daily, valsartan 320 mg daily **Blood pressure is currently well controlled  Hyperlipidemia: Patient is on high intensity statin with atorvastatin 80 mg daily.  Most recent LDL was 54 in 06/2016, which is at goal of <70.   The patient is planned to have shoulder surgery, not yet scheduled.  I will discuss holding her Plavix for the procedure with Dr. Burt Knack.  She is currently having no exertional chest discomfort or dyspnea.  **She is cleared to undergo the current procedure without any further cardiac testing.  Past Medical History:  Diagnosis Date  . Adenomatous polyp of colon 02/2004  . Anemia   . Anxiety   . Arthritis   . B12 deficiency   . CAD (coronary artery disease)    a. s/p remote BMS to LAD;  b. 09/2015 Inf STEMI/VF Arrest: LM nl, LAD 85p (staged PCI 2 days later w/ 3.0x32 Synergy DES), 40p/m, 25d, LCX 73m, RCA 80ost/100p (2.75x32 Synergy DES), 35m, EF 55-65%. // c. Myoview 2/18: EF 59, no ischemia or infarction; Normal study  . Depression   . Dermatophytosis of groin and perianal area   . Diet Controlled Diabetes Mellitus   . Diverticulosis   . H/O echocardiogram    a. 09/2015 Echo: EF 60-65%, no rwma, mild AI, mildly dil RA, mild to mod TR.  Marland Kitchen Hyperlipidemia   . Hypertensive heart disease   . Low back pain    l5 disc  . Morbid obesity ([place])   .  Osteoporosis   . Sleep apnea 10/03/2009   Resolved after gastric bypass    . TOBACCO ABUSE 10/05/2009   Qualifier: Diagnosis of  By: Stanford Breed, MD, Kandyce Rud   . Ventricular fibrillation ([place]) 10/01/2015   a. In setting of inferior STEMI.    Past Surgical History:  Procedure Laterality Date  . ABDOMINAL HYSTERECTOMY    . APPENDECTOMY    . BARIATRIC SURGERY    . CARDIAC CATHETERIZATION N/A 10/01/2015   Procedure: Left Heart Cath and Coronary Angiography;  Surgeon: Leonie Man, MD;  Location: [facility];  Service: Cardiovascular;  Laterality: N/A;  . CARDIAC CATHETERIZATION N/A  10/01/2015   Procedure: Coronary Stent Intervention;  Surgeon: Leonie Man, MD;  Location: [facility];  Service: Cardiovascular;  Laterality: N/A;  . CARDIAC CATHETERIZATION N/A 10/03/2015   Procedure: Coronary Stent Intervention;  Surgeon: Troy Sine, MD;  Location: [facility];  Service: Cardiovascular;  Laterality: N/A;  . CARDIAC CATHETERIZATION N/A 10/03/2015   Procedure: Coronary/Graft Angiography;  Surgeon: Troy Sine, MD;  Location: [facility];  Service: Cardiovascular;  Laterality: N/A;  . CARPAL TUNNEL RELEASE     bilateral  . CERVICAL DISC SURGERY    . CHOLECYSTECTOMY    . CORONARY ANGIOPLASTY  2002   2 times  . HERNIA REPAIR    . LUMBAR LAMINECTOMY     l3-4  . NEPHRECTOMY     partial  . TONSILLECTOMY    . TOTAL KNEE ARTHROPLASTY     L  . TUBAL LIGATION    . ulnar nerve release     and thumb surgery    Current Medications: No outpatient medications have been marked as taking for the 03/17/17 encounter (Appointment) with Richardson Dopp T, PA-C.     Allergies:   Sulfa antibiotics   Social History   Socioeconomic History  . Marital status: Married    Spouse name: Not on file  . Number of children: Not on file  . Years of education: Not on file  . Highest education level: Not on file  Social Needs  . Financial resource strain: Not on file  . Food insecurity - worry: Not on file  . Food insecurity - inability: Not on file  . Transportation needs - medical: Not on file  . Transportation needs - non-medical: Not on file  Occupational History  . Occupation: retired    Fish farm manager: UNEMPLOYED  Tobacco Use  . Smoking status: Former Smoker    Packs/day: 2.00    Years: 41.00    Pack years: 82.00    Types: Cigarettes    Last attempt to quit: 03/06/2008    Years since quitting: 9.0  . Smokeless tobacco: Never Used  Substance and Sexual Activity  . Alcohol use: No    Alcohol/week: 0.0 oz  . Drug use: No  . Sexual activity: Yes  Other  Topics Concern  . Not on file  Social History Narrative   Widowed in 04/2012. 2 children. 3 grandkids.    Lives alone. Completely indendent.       Disabled/retired. Disabled-lifted computer paper      Hobbies: spend money-gamble     Family History: The patient's ***family history includes Colitis in her mother; Coronary artery disease in her unknown relative; Heart disease in her father; Stomach cancer in her maternal uncle. ROS:   Please see the history of present illness.    *** All other systems reviewed and are negative.  EKGs/Labs/Other Studies Reviewed:    The following studies were reviewed today:  Lexiscan Myoview 03/25/2016 Study Highlights   The left ventricular ejection fraction is normal (55-65%).  Nuclear stress EF: 59%.  There was no ST segment deviation noted during stress.  The study is normal.   Normal study, no evidence for ischemia or infarction.   ------------------ Cardiac catheterization 10/03/2015 Conclusion   Prox RCA lesion, 0 %stenosed.  A drug eluting .  Ost RCA to Prox RCA lesion, 0 %stenosed.  A drug eluting .  Prox Cx to Mid Cx lesion, 40 %stenosed.  Prox LAD lesion, 85 %stenosed.  Dist LAD lesion, 25 %stenosed.  Mid RCA-2 lesion, 20 %stenosed.  Mid RCA-1 lesion, 65 %stenosed.  Dist Cx lesion, 60 %stenosed.  Ost 3rd Mrg lesion, 40 %stenosed.  A STENT SYNERGY DES 3X32 drug eluting stent was successfully placed, and overlaps previously placed stent.  Prox LAD to Mid LAD lesion, 85 %stenosed.  Post intervention, there is a 0% residual stenosis.   Relook coronary angiography confirmed widely patent stents in the ostium to proximal RCA.  There is no significant change in the 60% mid RCA stenosis with mild 20% narrowing proximal to the acute margin.  The left circumflex vessel has 40% proximal stenosis and there is distal bifurcation stenosis of 60 and 40% for which medical therapy is recommended.  The LAD has an 33 - 85%  eccentric stenosis proximal to and including the very proximal portion of the previously placed proximal bare-metal LAD stent.  There is diffuse intimal hyperplasia of 40% within the stented segment.  The distal LAD stent is widely patent without significant intimal hyperplasia.  Successful percutaneous coronary intervention to the proximal LAD utilizing Angiosculpt scoring balloon 2.515 mm, and ultimate DES stenting with a Synergy 3.032 mm stent, which covers the proximal stenosis and the entire region of the previously placed proximal BMS stent with intimal hyperplasia.  The residual stenosis is 0%.  There is brisk TIMI-3 flow.  RECOMMENDATION: The patient will continue with dual antiplatelet therapy.  Medical therapy is recommended for concomitant RCA and left circumflex disease.    Diagnostic Diagram       Post-Intervention Diagram        -----------------------------------  Left Heart Cath and Coronary Angiography  10/01/2015  Conclusion    Ost RCA to Prox RCA lesion, 80 %stenosed followed by Prox RCA lesion, 100 % thrombotic stenosis.  PTCA followed by PCI with a STENT SYNERGY DES 2.75X32 drug eluting stent was successfully placed. Post intervention, there is a 0% residual stenosis.  Mid RCA lesion, 65 %stenosed - uncertain significance.  Prox Cx to Mid Cx lesion, 40 %stenosed.  Prox LAD to Mid LAD bare metal stent, 40 %stenosed. Prox LAD lesion, 85 % in-stent restenosis  Dist LAD bare-metal stent, 25 %stenosed.  LV function is normal. The left ventricular ejection fraction is 55-65% by visual estimate. No obvious wall motion abnormality  LV end diastolic pressure is low to normal. - 750 mL normal saline administered.  Tortuous innominate artery. Would recommend femoral access for catheterizations in the future.    Successful culprit lesion PCI of the proximal and ostial RCA lesions with a single DES stent covering both segments. 60-65% lesion in the mid RCA.  Unable to evaluate with IC nitroglycerin due to hypotension.  Residual severe  mid LAD in-stent restenosis, would recommend PCI prior to discharge.  Plan:  Transfer to CCU for ongoing care. TR band removal per protocol. Sheath removal 2 hours  post Angiomax  Would hold ARB and beta blocker for now given hypotension in the Cath Lab. May restart tomorrow.  Aspirin plus Brilinta for minimum of 3 months. Would then potentially consider stopping aspirin.  Continue statin and nitrate  Would plan to return for staged PCI of the LAD lesion that is at least 85% as well as relook angiography of the right coronary artery to assess if this lesion is be treated as well.  ---------------------------  Echocardiogram 10/02/2015 Study Conclusions  - Left ventricle: The cavity size was normal. Wall thickness was   normal. Systolic function was normal. The estimated ejection   fraction was in the range of 60% to 65%. Wall motion was normal;   there were no regional wall motion abnormalities. - Aortic valve: There was mild regurgitation. - Right atrium: The atrium was mildly dilated. - Tricuspid valve: There was mild-moderate regurgitation.   EKG:  EKG is *** ordered today.  The ekg ordered today demonstrates ***  Recent Labs: 02/18/2017: ALT 17; BUN 32; Creatinine, Ser 0.91; Hemoglobin 13.3; Platelets 358.0; Potassium 4.3; Sodium 140   Recent Lipid Panel    Component Value Date/Time   CHOL 122 06/18/2016 0950   CHOL 125 03/05/2016 0948   TRIG 82.0 06/18/2016 0950   TRIG 122 12/11/2005 1028   HDL 51.20 06/18/2016 0950   HDL 45 03/05/2016 0948   CHOLHDL 2 06/18/2016 0950   VLDL 16.4 06/18/2016 0950   LDLCALC 54 06/18/2016 0950   LDLCALC 63 03/05/2016 0948   LDLDIRECT 86.0 09/11/2015 1104    Physical Exam:    VS:  There were no vitals taken for this visit.    Wt Readings from Last 3 Encounters:  03/03/17 214 lb 12.8 oz (97.4 kg)  02/18/17 208 lb 3.2 oz (94.4 kg)  09/11/16 185 lb  6.1 oz (84.1 kg)     Physical Exam***   ASSESSMENT:    No diagnosis found. PLAN:    In order of problems listed above:    Medication Adjustments/Labs and Tests Ordered: Current medicines are reviewed at length with the patient today.  Concerns regarding medicines are outlined above. Labs and tests ordered and medication changes are outlined in the patient instructions below:  There are no Patient Instructions on file for this visit.   Signed, Daune Perch, NP  03/16/2017 4:53 PM    [facility]

## 2017-03-17 ENCOUNTER — Ambulatory Visit: Payer: PPO | Admitting: Physician Assistant

## 2017-03-19 ENCOUNTER — Telehealth: Payer: Self-pay

## 2017-03-19 NOTE — Telephone Encounter (Signed)
Copied from [place]. Topic: General - Other >> Mar 19, 2017  9:17 AM Conception Chancy, NT wrote: Patient states she is receiving bills in regards to her prolia injection not being filed. She is requesting the person that handles and files these to give her a call.   >> Mar 19, 2017 10:08 AM Williemae Area, RN wrote: Roselyn Reef, does this go to you or to billing?  Called patient and left a voicemail message asking her to bring her bill by the office so we can submit to the [employer] for reimbursement. The form is under the December tab in my [employer].

## 2017-03-27 ENCOUNTER — Other Ambulatory Visit: Payer: Self-pay | Admitting: Physician Assistant

## 2017-03-27 ENCOUNTER — Other Ambulatory Visit: Payer: Self-pay | Admitting: Cardiovascular Disease

## 2017-03-27 ENCOUNTER — Ambulatory Visit
Admission: RE | Admit: 2017-03-27 | Discharge: 2017-03-27 | Disposition: A | Discharge status: Home / Self Care | Payer: PPO | Source: Ambulatory Visit | Attending: Family Medicine | Admitting: Family Medicine

## 2017-03-27 DIAGNOSIS — Z1231 Encounter for screening mammogram for malignant neoplasm of breast: Secondary | ICD-10-CM | POA: Diagnosis not present

## 2017-03-28 ENCOUNTER — Encounter: Payer: Self-pay | Admitting: Family Medicine

## 2017-04-02 ENCOUNTER — Encounter: Payer: Self-pay | Admitting: Gastroenterology

## 2017-04-02 ENCOUNTER — Telehealth: Payer: Self-pay

## 2017-04-02 ENCOUNTER — Ambulatory Visit: Payer: PPO | Admitting: Gastroenterology

## 2017-04-02 VITALS — BP 138/70 | HR 74 | Ht 62.0 in | Wt 216.0 lb

## 2017-04-02 DIAGNOSIS — Z86010 Personal history of colonic polyps: Secondary | ICD-10-CM

## 2017-04-02 DIAGNOSIS — K921 Melena: Secondary | ICD-10-CM | POA: Diagnosis not present

## 2017-04-02 MED ORDER — NA SULFATE-K SULFATE-MG SULF 17.5-3.13-1.6 GM/177ML PO SOLN: 1.0000 | Freq: Once | ORAL | 0 refills | Status: AC

## 2017-04-02 NOTE — Telephone Encounter (Signed)
   Tricia [name] [birth year] [id number]    We have scheduled the above named patient for a(n) Colonoscopy procedure. Our records show that (s)he is on anticoagulation therapy.  Please advise as to whether the patient may come off their therapy of Plavix 5 days prior to their procedure which is scheduled for 04/27/17.  Please route your response to Marlon Pel, CMA or fax response to [phone].  Sincerely,    [facility]

## 2017-04-02 NOTE — Patient Instructions (Signed)
You have been scheduled for a colonoscopy. Please follow written instructions given to you at your visit today.  Please pick up your prep supplies at the pharmacy within the next 1-3 days. If you use inhalers (even only as needed), please bring them with you on the day of your procedure. Your physician has requested that you go to www.startemmi.com and enter the access code given to you at your visit today. This web site gives a general overview about your procedure. However, you should still follow specific instructions given to you by our office regarding your preparation for the procedure.  Thank you for choosing me and [facility].  Malcolm T. Stark, Jr., MD., FACG  

## 2017-04-02 NOTE — Telephone Encounter (Signed)
Patient is scheduled to see Richardson Dopp, PA on 04/03/17

## 2017-04-02 NOTE — Progress Notes (Signed)
Cardiology Office Note:    Date:  04/03/2017   ID:  Tricia [name], DOB 12/12/1945, MRN [id number]  PCP:  Briscoe Deutscher, DO  Cardiologist:  Sherren Mocha, MD   Referring MD: Briscoe Deutscher, DO   Chief Complaint  Patient presents with  . Surgical clearance    History of Present Illness:    Tricia [name] is a 72 y.o. female with coronary artery disease status post remote bare-metal stent to the LAD, hypertension, diabetes, hyperlipidemia, morbid obesity.  She suffered an inferior ST elevation myocardial infarction complicated by ventricular fibrillation arrest in August 2017 requiring CPR and defibrillation.  The RCA was occluded and treated with a drug-eluting stent and she underwent staged PCI of the LAD with a drug-eluting stent secondary to severe in-stent restenosis.  Of note, she was intolerant to ticagrelor secondary to shortness of breath.  Nuclear stress test in February 2018 was low risk and negative for ischemia.  Ms. Tricia [name] returns for surgical clearance.  She needs a right shoulder arthroscopy with Dr. Noemi Chapel.  She also needs a colonoscopy later this month with Dr. Fuller Plan.  Since last seen, she has done well.  She does get short of breath at times with certain activities.  This is chronic without significant change.  She denies any recent chest discomfort.  She denies PND or edema.  She denies syncope.  She has decreased activity secondary to back pain.  Prior CV studies:   The following studies were reviewed today:  Nuclear stress test 03/25/16 EF 59, no ischemia or scar, normal study  LHC 10/03/15 LAD prox and mid stent 85% ISR, dist stent ok with 25% ISR LCx prox 40%, dist 60% RCA ostial stent ok, prox stent ok, mid 65% and 20% PCI Angiosculpt scoring balloon and 3 x 32 mm Synergy DES to prox and mid LAD  Echo 10/02/15 EF 60-65%, normal wall motion, mild AI, mild RAE, mild to moderate TR  LHC 10/01/15 RCA ostial 80% and proximal 100%, mid 65% LCx prox 40% LAD prox  stent 40% ISR, 85% ISR, dist stent ok with 25% ISR EF 55-65% PCI: 2.75 x 32 mm Synergy DES to ost/prox RCA  Past Medical History:  Diagnosis Date  . Adenomatous polyp of colon 02/2004  . Anemia   . Anxiety   . Arthritis   . B12 deficiency   . CAD (coronary artery disease)    a. s/p remote BMS to LAD;  b. 09/2015 Inf STEMI/VF Arrest: LM nl, LAD 85p (staged PCI 2 days later w/ 3.0x32 Synergy DES), 40p/m, 25d, LCX 66m RCA 80ost/100p (2.75x32 Synergy DES), 635mEF 55-65%. // c. Myoview 2/18: EF 59, no ischemia or infarction; Normal study  . Depression   . Dermatophytosis of groin and perianal area   . Diet Controlled Diabetes Mellitus   . Diverticulosis   . H/O echocardiogram    a. 09/2015 Echo: EF 60-65%, no rwma, mild AI, mildly dil RA, mild to mod TR.  . Marland Kitchenyperlipidemia   . Hypertensive heart disease   . Low back pain    l5 disc  . Morbid obesity (HCSequoyah  . Osteoporosis   . Sleep apnea 10/03/2009   Resolved after gastric bypass    . TOBACCO ABUSE 10/05/2009   Qualifier: Diagnosis of  By: CrStanford BreedMD, FAKandyce Rud . Ventricular fibrillation (HCMcCool Junction08/28/2017   a. In setting of inferior STEMI.    Past Surgical History:  Procedure Laterality Date  . ABDOMINAL HYSTERECTOMY    .  APPENDECTOMY    . BARIATRIC SURGERY    . CARDIAC CATHETERIZATION N/A 10/01/2015   Procedure: Left Heart Cath and Coronary Angiography;  Surgeon: Leonie Man, MD;  Location: [place] [facility];  Service: Cardiovascular;  Laterality: N/A;  . CARDIAC CATHETERIZATION N/A 10/01/2015   Procedure: Coronary Stent Intervention;  Surgeon: Leonie Man, MD;  Location: [place] [facility];  Service: Cardiovascular;  Laterality: N/A;  . CARDIAC CATHETERIZATION N/A 10/03/2015   Procedure: Coronary Stent Intervention;  Surgeon: Troy Sine, MD;  Location: [place] [facility];  Service: Cardiovascular;  Laterality: N/A;  . CARDIAC CATHETERIZATION N/A 10/03/2015   Procedure: Coronary/Graft Angiography;  Surgeon:  Troy Sine, MD;  Location: [place] [facility];  Service: Cardiovascular;  Laterality: N/A;  . CARPAL TUNNEL RELEASE     bilateral  . CERVICAL DISC SURGERY    . CHOLECYSTECTOMY    . CORONARY ANGIOPLASTY  2002   2 times  . HERNIA REPAIR    . LUMBAR LAMINECTOMY     l3-4  . NEPHRECTOMY     partial  . TONSILLECTOMY    . TOTAL KNEE ARTHROPLASTY     L  . TUBAL LIGATION    . ulnar nerve release     and thumb surgery    Current Medications: Current Meds  Medication Sig  . albuterol (PROVENTIL HFA;VENTOLIN HFA) 108 (90 Base) MCG/ACT inhaler Inhale 2 puffs into the lungs every 6 (six) hours as needed for wheezing or shortness of breath.  Marland Kitchen amLODipine (NORVASC) 5 MG tablet Take 1 tablet (5 mg total) by mouth daily.  Marland Kitchen aspirin EC 81 MG tablet Take 81 mg by mouth daily.  Marland Kitchen atorvastatin (LIPITOR) 80 MG tablet TAKE 1 TABLET (80 MG TOTAL) BY MOUTH DAILY.  Marland Kitchen Biotin 10000 MCG TABS Take 10,000 mcg by mouth daily. Dissolvable tablets  . blood glucose meter kit and supplies KIT Dispense based on patient and insurance preference. Use to test daily. (FOR ICD-9 250.00, 250.01). Dx Code:E11.22 N18.1  . blood glucose meter kit and supplies Dispense based on patient and insurance preference. Use up to four times daily as directed. (FOR ICD-9 250.00, 250.01).  . carvedilol (COREG) 6.25 MG tablet Take 1 tablet (6.25 mg total) by mouth 2 (two) times daily with a meal.  . Cholecalciferol (VITAMIN D3) 2000 units CHEW Chew 2,000 Units by mouth daily.  . clopidogrel (PLAVIX) 75 MG tablet TAKE 1 TABLET (75 MG TOTAL) BY MOUTH DAILY.  . fluticasone (FLONASE) 50 MCG/ACT nasal spray Place 2 sprays into both nostrils as needed for allergies or rhinitis.  . furosemide (LASIX) 20 MG tablet Take one by mouth daily as needed for edema.  . gabapentin (NEURONTIN) 100 MG capsule Take 1 capsule (100 mg total) by mouth 3 (three) times daily.  Marland Kitchen glucose blood (FREESTYLE LITE) test strip TEST UP TO FOUR TIMES DAILY Dx: E11.22    . HYDROcodone-acetaminophen (NORCO) 10-325 MG tablet Take 1 tablet by mouth every 8 (eight) hours as needed.  . Lancets (FREESTYLE) lancets TEST UP TO FOUR TIMES DAILY Dx: E11.22  . Multiple Vitamin (MULTIVITAMIN WITH MINERALS) TABS tablet Take 1 tablet by mouth daily. Women's One a Day  . nitroGLYCERIN (NITROLINGUAL) 0.4 MG/SPRAY spray Place 1 spray under the tongue every 5 (five) minutes x 3 doses as needed for chest pain.  . pantoprazole (PROTONIX) 40 MG tablet TAKE 1 TABLET (40 MG TOTAL) BY MOUTH DAILY.  Marland Kitchen potassium chloride SA (K-DUR,KLOR-CON) 20 MEQ tablet Take with Lasix  dose.  . valsartan (DIOVAN) 320 MG tablet TAKE 1 TABLET (320 MG TOTAL) BY MOUTH DAILY.  . [DISCONTINUED] nitroGLYCERIN (NITROSTAT) 0.4 MG SL tablet Place 1 tablet (0.4 mg total) under the tongue every 5 (five) minutes as needed for chest pain.     Allergies:   Sulfa antibiotics   Social History   Tobacco Use  . Smoking status: Former Smoker    Packs/day: 2.00    Years: 41.00    Pack years: 82.00    Types: Cigarettes    Last attempt to quit: 03/06/2008    Years since quitting: 9.0  . Smokeless tobacco: Never Used  Substance Use Topics  . Alcohol use: No    Alcohol/week: 0.0 oz  . Drug use: No     Family Hx: The patient's family history includes Colitis in her mother; Coronary artery disease in her unknown relative; Heart disease in her father; Stomach cancer in her maternal uncle.  ROS:   Please see the history of present illness.    ROS All other systems reviewed and are negative.   EKGs/Labs/Other Test Reviewed:    EKG:  EKG is  ordered today.  The ekg ordered today demonstrates normal sinus rhythm, heart rate 62, left axis deviation, QTC 422 ms, PVCs, similar to prior tracings  Recent Labs: 02/18/2017: ALT 17; BUN 32; Creatinine, Ser 0.91; Hemoglobin 13.3; Platelets 358.0; Potassium 4.3; Sodium 140   Recent Lipid Panel Lab Results  Component Value Date/Time   CHOL 122 06/18/2016 09:50 AM   CHOL  125 03/05/2016 09:48 AM   TRIG 82.0 06/18/2016 09:50 AM   TRIG 122 12/11/2005 10:28 AM   HDL 51.20 06/18/2016 09:50 AM   HDL 45 03/05/2016 09:48 AM   CHOLHDL 2 06/18/2016 09:50 AM   LDLCALC 54 06/18/2016 09:50 AM   LDLCALC 63 03/05/2016 09:48 AM   LDLDIRECT 86.0 09/11/2015 11:04 AM    Physical Exam:    VS:  BP 140/72   Pulse 62   Ht 5' 2" (1.575 m)   Wt 216 lb 12.8 oz (98.3 kg)   SpO2 94%   BMI 39.65 kg/m     Wt Readings from Last 3 Encounters:  04/03/17 216 lb 12.8 oz (98.3 kg)  04/02/17 216 lb (98 kg)  03/03/17 214 lb 12.8 oz (97.4 kg)     Physical Exam  Constitutional: She is oriented to person, place, and time. She appears well-developed and well-nourished. No distress.  HENT:  Head: Normocephalic and atraumatic.  Neck: No JVD present.  Cardiovascular: Normal rate and regular rhythm.  No murmur heard. Pulmonary/Chest: Effort normal. She has no rales.  Abdominal: Soft.  Musculoskeletal: She exhibits edema (trace bilat LE edema).  Neurological: She is alert and oriented to person, place, and time.  Skin: Skin is warm and dry.    ASSESSMENT & PLAN:    #1.  Preoperative cardiovascular examination The Revised Cardiac Risk Index indicates that her Perioperative Risk of Major Cardiac Event is (%): 0.9.  Therefore, she is at low risk for perioperative complications.  Her Functional Capacity in METs is good at: 4.64 as indicated by the Duke Activity Status Index (DASI).  According to ACC/AHA guidelines, no further cardiovascular testing needed.  The patient may proceed to surgery at acceptable risk.  She may hold Plavix if needed for 5 days.  This should be resumed post procedure as soon as possible when felt to be safe.  We prefer that she remain on aspirin without interruption.   #2.  Coronary artery disease History of inferior ST elevation myocardial infarction in August 2017 treated with drug-eluting stent to the RCA and staged PCI of the LAD with a drug-eluting stent.   Nuclear stress test in February 2018 was low risk without ischemia and normal EF.  That she is doing well without recurrent angina.  Continue aspirin, statin, beta-blocker, amlodipine, ARB.  #3.  Essential hypertension  Blood pressure above target today.  I repeated it and it was 140/80.  I have asked her to continue to monitor her blood pressure and send me some readings after a few weeks.  For now continue current dose of amlodipine, carvedilol, valsartan.  #4.  Hyperlipidemia, unspecified hyperlipidemia type LDL optimal on most recent lab work.  Continue current Rx.    #5.  Controlled type 2 diabetes mellitus  Continue follow-up with primary care.  #6.  PVC's (premature ventricular contractions)  PVCs noted on her ECG today.  She showed me some tracings that she obtained from her Apple watch.  She has had frequent PVCs recently.  She does not really have a lot of symptoms associated with this.  She denies any worsening shortness of breath.  I will obtain a BMET, magnesium level today.  I will also obtain a 24-hour Holter to assess for PVC burden.   Dispo:  Return in about 6 months (around 10/04/2017) for Routine Follow Up, w/ Dr. Burt Knack.   Medication Adjustments/Labs and Tests Ordered: Current medicines are reviewed at length with the patient today.  Concerns regarding medicines are outlined above.  Tests Ordered: Orders Placed This Encounter  Procedures  . Basic metabolic panel  . Magnesium  . EKG 12-Lead  . ECHOCARDIOGRAM COMPLETE   Medication Changes: Meds ordered this encounter  Medications  . amLODipine (NORVASC) 5 MG tablet    Sig: Take 1 tablet (5 mg total) by mouth daily.    Dispense:  90 tablet    Refill:  3    Order Specific Question:   Supervising Provider    Answer:   COOPER, MICHAEL [[id number]]  . nitroGLYCERIN (NITROLINGUAL) 0.4 MG/SPRAY spray    Sig: Place 1 spray under the tongue every 5 (five) minutes x 3 doses as needed for chest pain.    Dispense:  4.9 g     Refill:  3    Please dispense 2 bottles at a time    Order Specific Question:   Supervising Provider    Answer:   Burt Knack, MICHAEL [[id number]]  . carvedilol (COREG) 6.25 MG tablet    Sig: Take 1 tablet (6.25 mg total) by mouth 2 (two) times daily with a meal.    Dispense:  180 tablet    Refill:  3    Order Specific Question:   Supervising Provider    Answer:   Sherren Mocha [[id number]]    Signed, Richardson Dopp, PA-C  04/03/2017 9:08 AM    [facility] [address], [place], [place]  [address] Phone: [phone]; Fax: [phone]

## 2017-04-02 NOTE — Telephone Encounter (Signed)
   Primary [place], MD  Chart reviewed as part of pre-operative protocol coverage. Because of Saxon Crosby Seelman's past medical history and time since last visit, he/she will require a follow-up visit in order to better assess preoperative cardiovascular risk.  Pre-op covering staff: - Please schedule appointment and call patient to inform them. - Please contact requesting surgeon's office via preferred method (i.e, phone, fax) to inform them of need for appointment prior to surgery.  [place], [place]  04/02/2017, 3:40 PM

## 2017-04-02 NOTE — Progress Notes (Signed)
History of Present Illness: This is a 72 year old female referred by Briscoe Deutscher, DO for the evaluation of hematochezia and a personal history of adenomatous colon polyps.  Colonoscopy in September 2014 showed several small adenomatous colon polyps.  She noted a small streak of red blood on the stool following a bowel movement recently.  She supplied a photo on her phone that I reviewed which shows a small red streak on about brown formed stool.  She has not noted any blood since then and has no other gastrointestinal complaints.  She is maintained on Plavix and aspirin for cardiac stent with a history of an MI.  She has a routine follow-up  appointment with her cardiologist in the next few days. Denies weight loss, abdominal pain, constipation, diarrhea, change in stool caliber, melena, nausea, vomiting, dysphagia, reflux symptoms, chest pain.   Allergies  Allergen Reactions  . Sulfa Antibiotics Diarrhea and Nausea And Vomiting   Outpatient Medications Prior to Visit  Medication Sig Dispense Refill  . albuterol (PROVENTIL HFA;VENTOLIN HFA) 108 (90 Base) MCG/ACT inhaler Inhale 2 puffs into the lungs every 6 (six) hours as needed for wheezing or shortness of breath. 1 Inhaler 3  . amLODipine (NORVASC) 5 MG tablet Take 1 tablet (5 mg total) by mouth daily. Please keep upcoming appointment for further refills 30 tablet 0  . aspirin EC 81 MG tablet Take 81 mg by mouth daily.    Marland Kitchen atorvastatin (LIPITOR) 80 MG tablet TAKE 1 TABLET (80 MG TOTAL) BY MOUTH DAILY. 90 tablet 3  . benzonatate (TESSALON) 200 MG capsule Take 1 capsule (200 mg total) by mouth 2 (two) times daily as needed for cough. 20 capsule 0  . Biotin 10000 MCG TABS Take 10,000 mcg by mouth daily. Dissolvable tablets    . blood glucose meter kit and supplies KIT Dispense based on patient and insurance preference. Use to test daily. (FOR ICD-9 250.00, 250.01). Dx Code:E11.22 N18.1 1 each 0  . blood glucose meter kit and supplies  Dispense based on patient and insurance preference. Use up to four times daily as directed. (FOR ICD-9 250.00, 250.01). 1 each 0  . carvedilol (COREG) 6.25 MG tablet Take 1 tablet (6.25 mg total) by mouth 2 (two) times daily with a meal. Please keep upcoming appt for future refills or obtain from pcp 60 tablet 0  . cetirizine (ZYRTEC) 10 MG tablet Take 1 tablet (10 mg total) by mouth daily. 30 tablet 0  . Cholecalciferol (VITAMIN D3) 2000 units CHEW Chew 2,000 Units by mouth daily.    . clopidogrel (PLAVIX) 75 MG tablet TAKE 1 TABLET (75 MG TOTAL) BY MOUTH DAILY. 30 tablet 11  . diazepam (VALIUM) 2 MG tablet Take 1 tablet (2 mg total) by mouth every 6 (six) hours as needed for anxiety. 30 tablet 0  . fluticasone (FLONASE) 50 MCG/ACT nasal spray SPRAY 2 SPRAYS INTO EACH NOSTRIL EVERY DAY 16 g 0  . furosemide (LASIX) 20 MG tablet Take one by mouth daily as needed for edema. 20 tablet 1  . gabapentin (NEURONTIN) 100 MG capsule Take 1 capsule (100 mg total) by mouth 3 (three) times daily. 90 capsule 1  . glucose blood (FREESTYLE LITE) test strip TEST UP TO FOUR TIMES DAILY Dx: E11.22 360 each 5  . HYDROcodone-acetaminophen (NORCO) 10-325 MG tablet Take 1 tablet by mouth every 8 (eight) hours as needed. 30 tablet 0  . Lancets (FREESTYLE) lancets TEST UP TO FOUR TIMES DAILY Dx: E11.22 360 each 5  .  Multiple Vitamin (MULTIVITAMIN WITH MINERALS) TABS tablet Take 1 tablet by mouth daily. Women's One a Day    . nitroGLYCERIN (NITROLINGUAL) 0.4 MG/SPRAY spray Place 1 spray under the tongue every 5 (five) minutes as needed for chest pain. 4.9 g 3  . nitroGLYCERIN (NITROSTAT) 0.4 MG SL tablet Place 1 tablet (0.4 mg total) under the tongue every 5 (five) minutes as needed for chest pain. 50 tablet 0  . pantoprazole (PROTONIX) 40 MG tablet TAKE 1 TABLET (40 MG TOTAL) BY MOUTH DAILY. 30 tablet 11  . potassium chloride SA (K-DUR,KLOR-CON) 20 MEQ tablet Take with Lasix dose. 20 tablet 1  . tiZANidine (ZANAFLEX) 2 MG  tablet Take by mouth every 8 (eight) hours as needed for muscle spasms.    . valsartan (DIOVAN) 320 MG tablet TAKE 1 TABLET (320 MG TOTAL) BY MOUTH DAILY. 30 tablet 3  . cephALEXin (KEFLEX) 500 MG capsule Take 1 capsule (500 mg total) by mouth 3 (three) times daily. 15 capsule 0   No facility-administered medications prior to visit.    Past Medical History:  Diagnosis Date  . Adenomatous polyp of colon 02/2004  . Anemia   . Anxiety   . Arthritis   . B12 deficiency   . CAD (coronary artery disease)    a. s/p remote BMS to LAD;  b. 09/2015 Inf STEMI/VF Arrest: LM nl, LAD 85p (staged PCI 2 days later w/ 3.0x32 Synergy DES), 40p/m, 25d, LCX 7m RCA 80ost/100p (2.75x32 Synergy DES), 623mEF 55-65%. // c. Myoview 2/18: EF 59, no ischemia or infarction; Normal study  . Depression   . Dermatophytosis of groin and perianal area   . Diet Controlled Diabetes Mellitus   . Diverticulosis   . H/O echocardiogram    a. 09/2015 Echo: EF 60-65%, no rwma, mild AI, mildly dil RA, mild to mod TR.  . Marland Kitchenyperlipidemia   . Hypertensive heart disease   . Low back pain    l5 disc  . Morbid obesity (HCFallston  . Osteoporosis   . Sleep apnea 10/03/2009   Resolved after gastric bypass    . TOBACCO ABUSE 10/05/2009   Qualifier: Diagnosis of  By: CrStanford BreedMD, FAKandyce Rud . Ventricular fibrillation (HCViola08/28/2017   a. In setting of inferior STEMI.   Past Surgical History:  Procedure Laterality Date  . ABDOMINAL HYSTERECTOMY    . APPENDECTOMY    . BARIATRIC SURGERY    . CARDIAC CATHETERIZATION N/A 10/01/2015   Procedure: Left Heart Cath and Coronary Angiography;  Surgeon: DaLeonie ManMD;  [facility];  Service: Cardiovascular;  Laterality: N/A;  . CARDIAC CATHETERIZATION N/A 10/01/2015   Procedure: Coronary Stent Intervention;  Surgeon: DaLeonie ManMD;  [facility];  Service: Cardiovascular;  Laterality: N/A;  . CARDIAC CATHETERIZATION N/A 10/03/2015    Procedure: Coronary Stent Intervention;  Surgeon: ThTroy SineMD;  [facility];  Service: Cardiovascular;  Laterality: N/A;  . CARDIAC CATHETERIZATION N/A 10/03/2015   Procedure: Coronary/Graft Angiography;  Surgeon: ThTroy SineMD;  [facility];  Service: Cardiovascular;  Laterality: N/A;  . CARPAL TUNNEL RELEASE     bilateral  . CERVICAL DISC SURGERY    . CHOLECYSTECTOMY    . CORONARY ANGIOPLASTY  2002   2 times  . HERNIA REPAIR    . LUMBAR LAMINECTOMY     l3-4  . NEPHRECTOMY     partial  . TONSILLECTOMY    .  TOTAL KNEE ARTHROPLASTY     L  . TUBAL LIGATION    . ulnar nerve release     and thumb surgery   Social History   Socioeconomic History  . Marital status: Married    Spouse name: None  . Number of children: None  . Years of education: None  . Highest education level: None  Social Needs  . Financial resource strain: None  . Food insecurity - worry: None  . Food insecurity - inability: None  . Transportation needs - medical: None  . Transportation needs - non-medical: None  Occupational History  . Occupation: retired    Fish farm manager: UNEMPLOYED  Tobacco Use  . Smoking status: Former Smoker    Packs/day: 2.00    Years: 41.00    Pack years: 82.00    Types: Cigarettes    Last attempt to quit: 03/06/2008    Years since quitting: 9.0  . Smokeless tobacco: Never Used  Substance and Sexual Activity  . Alcohol use: No    Alcohol/week: 0.0 oz  . Drug use: No  . Sexual activity: Yes  Other Topics Concern  . None  Social History Narrative   Widowed in 04/2012. 2 children. 3 grandkids.    Lives alone. Completely indendent.       Disabled/retired. Disabled-lifted computer paper      Hobbies: spend money-gamble   Family History  Problem Relation Age of Onset  . Colitis Mother        sepsis from c dif colitis  . Heart disease Father   . Coronary artery disease Unknown   . Stomach cancer Maternal Uncle        Review of  Systems: Pertinent positive and negative review of systems were noted in the above HPI section. All other review of systems were otherwise negative.    Physical Exam: General: Well developed, well nourished, no acute distress Head: Normocephalic and atraumatic Eyes:  sclerae anicteric, EOMI Ears: Normal auditory acuity Mouth: No deformity or lesions Neck: Supple, no masses or thyromegaly Lungs: Clear throughout to auscultation Heart: Regular rate and rhythm; no murmurs, rubs or bruits Abdomen: Soft, non tender and non distended. No masses, hepatosplenomegaly or hernias noted. Normal Bowel sounds Rectal: Deferred to colonoscopy Musculoskeletal: Symmetrical with no gross deformities  Skin: No lesions on visible extremities Pulses:  Normal pulses noted Extremities: No clubbing, cyanosis, edema or deformities noted Neurological: Alert oriented x 4, grossly nonfocal Cervical Nodes:  No significant cervical adenopathy Inguinal Nodes: No significant inguinal adenopathy Psychological:  Alert and cooperative. Normal mood and affect  Assessment and Recommendations:  1. Personal history of adenomatous colon polyps due for a 5-year interval surveillance colonoscopy.  Scant hematochezia likely due to a benign anorectal source such as hemorrhoids which will be further evaluated at colonoscopy.  Schedule colonoscopy. The risks (including bleeding, perforation, infection, missed lesions, medication reactions and possible hospitalization or surgery if complications occur), benefits, and alternatives to colonoscopy with possible biopsy and possible polypectomy were discussed with the patient and they consent to proceed.   2. Hold Plavix 5 days before procedure - will instruct when and how to resume after procedure. Low but real risk of cardiovascular event such as heart attack, stroke, embolism, thrombosis or ischemia/infarct of other organs off Plavix explained and need to seek urgent help if this occurs.  The patient consents to proceed. Will communicate by phone or EMR with patient's prescribing provider to confirm that holding Plavix is reasonable in this case.  She is advised  to remain on her daily aspirin.    3.  GERD. Continue pantoprazole 40 mg daily.  Follow standard antireflux measures.   cc: Briscoe Deutscher, [address] [place], [place] [address]

## 2017-04-03 ENCOUNTER — Encounter: Payer: Self-pay | Admitting: Physician Assistant

## 2017-04-03 ENCOUNTER — Telehealth: Payer: Self-pay | Admitting: Physician Assistant

## 2017-04-03 ENCOUNTER — Ambulatory Visit: Payer: PPO | Admitting: Physician Assistant

## 2017-04-03 VITALS — BP 140/72 | HR 62 | Ht 62.0 in | Wt 216.8 lb

## 2017-04-03 DIAGNOSIS — E785 Hyperlipidemia, unspecified: Secondary | ICD-10-CM

## 2017-04-03 DIAGNOSIS — I2510 Atherosclerotic heart disease of native coronary artery without angina pectoris: Secondary | ICD-10-CM

## 2017-04-03 DIAGNOSIS — Z01810 Encounter for preprocedural cardiovascular examination: Secondary | ICD-10-CM

## 2017-04-03 DIAGNOSIS — I10 Essential (primary) hypertension: Secondary | ICD-10-CM | POA: Diagnosis not present

## 2017-04-03 DIAGNOSIS — E118 Type 2 diabetes mellitus with unspecified complications: Secondary | ICD-10-CM

## 2017-04-03 DIAGNOSIS — I493 Ventricular premature depolarization: Secondary | ICD-10-CM | POA: Diagnosis not present

## 2017-04-03 LAB — BASIC METABOLIC PANEL
BUN/Creatinine Ratio: 18 (ref 12–28)
BUN: 16 mg/dL (ref 8–27)
CO2: 23 mmol/L (ref 20–29)
Calcium: 8.9 mg/dL (ref 8.7–10.3)
Chloride: 105 mmol/L (ref 96–106)
Creatinine, Ser: 0.87 mg/dL (ref 0.57–1.00)
GFR calc Af Amer: 79 mL/min/{1.73_m2} (ref >59)
GFR calc non Af Amer: 68 mL/min/{1.73_m2} (ref >59)
Glucose: 108 mg/dL — ABNORMAL HIGH (ref 65–99)
Potassium: 4.9 mmol/L (ref 3.5–5.2)
Sodium: 140 mmol/L (ref 134–144)

## 2017-04-03 LAB — MAGNESIUM: Magnesium: 2.3 mg/dL (ref 1.6–2.3)

## 2017-04-03 MED ORDER — CARVEDILOL 6.25 MG PO TABS: 6.2500 mg | ORAL_TABLET | Freq: Two times a day (BID) | ORAL | 3 refills | Status: DC

## 2017-04-03 MED ORDER — AMLODIPINE BESYLATE 5 MG PO TABS: 5.0000 mg | ORAL_TABLET | Freq: Every day | ORAL | 3 refills | Status: DC

## 2017-04-03 MED ORDER — NITROGLYCERIN 0.4 MG/SPRAY TL SOLN: 1.0000 | 3 refills | Status: DC | PRN

## 2017-04-03 NOTE — Telephone Encounter (Signed)
#  1.  Preoperative cardiovascular examination The Revised Cardiac Risk Index indicates that her Perioperative Risk of Major Cardiac Event is (%): 0.9.  Therefore, she is at low risk for perioperative complications.  Her Functional Capacity in METs is good at: 4.64 as indicated by the Duke Activity Status Index (DASI).  According to ACC/AHA guidelines, no further cardiovascular testing needed.  The patient may proceed to surgery at acceptable risk.  She may hold Plavix if needed for 5 days.  This should be resumed post procedure as soon as possible when felt to be safe.  We prefer that she remain on aspirin without interruption.

## 2017-04-03 NOTE — Telephone Encounter (Signed)
I s/w pt and went over as to her question from this morning during her ov today if she should have an Abdominal U/S to make sure she does not have a AAA, as to her mother had a AAA. Pt opts at this time to hold off since she feels she does not have any symptoms. I did tell pt if she changes her mind and would like to order to call the office and we will be happy to schedule test for her based on family hx AAA.

## 2017-04-03 NOTE — Patient Instructions (Addendum)
Medication Instructions:  1. Your physician recommends that you continue on your current medications as directed. Please refer to the Current Medication list given to you today.   Labwork: TODAY BMET, MAGNESIUM LEVEL  Testing/Procedures: Your physician has recommended that you wear a 24 hour holter monitor. Holter monitors are medical devices that record the heart's electrical activity. Doctors most often use these monitors to diagnose arrhythmias. Arrhythmias are problems with the speed or rhythm of the heartbeat. The monitor is a small, portable device. You can wear one while you do your normal daily activities. This is usually used to diagnose what is causing palpitations/syncope (passing out).     Follow-Up: Your physician wants you to follow-up in: 6 MONTHS WITH DR. Emelda Fear will receive a reminder letter in the mail two months in advance. If you don't receive a letter, please call our office to schedule the follow-up appointment.   Any Other Special Instructions Will Be Listed Below (If Applicable). CHECK BLOOD PRESSURE A FEW TIMES A WEEK. AFTER 2-3 WEEKS SEND OR CALL READINGS [phone]    If you need a refill on your cardiac medications before your next appointment, please call your pharmacy.

## 2017-04-03 NOTE — Telephone Encounter (Signed)
Reviewed patient's mother's Carlyle Basques, DOB 05/31/1916) chart.  She had a small AAA (never measured > 3.6 cm).   There is not good evidence to suggest benefits are greater than the harm for a screening abdominal ultrasound in women who have ever smoked.  It is recommended in men between 65-75 who have ever smoked.  It is not unreasonable to get an ultrasound if she is interested in getting one. If she would like to get an AAA ultrasound, we can order it.  Use family history of abdominal aortic aneurysm as the diagnosis. Richardson Dopp, PA-C    04/03/2017 1:56 PM

## 2017-04-06 ENCOUNTER — Telehealth: Payer: Self-pay | Admitting: Surgical

## 2017-04-06 NOTE — Telephone Encounter (Signed)
Spoke with patient and she would like Jamie H. To call her about prolia. She stated that she got a denial letter for one of her Prolia.

## 2017-04-08 ENCOUNTER — Ambulatory Visit (INDEPENDENT_AMBULATORY_CARE_PROVIDER_SITE_OTHER): Payer: PPO

## 2017-04-08 DIAGNOSIS — I493 Ventricular premature depolarization: Secondary | ICD-10-CM | POA: Diagnosis not present

## 2017-04-10 ENCOUNTER — Ambulatory Visit (INDEPENDENT_AMBULATORY_CARE_PROVIDER_SITE_OTHER): Payer: PPO | Admitting: Family Medicine

## 2017-04-10 ENCOUNTER — Encounter: Payer: Self-pay | Admitting: Family Medicine

## 2017-04-10 VITALS — BP 142/74 | HR 61 | Temp 97.5°F | Ht 62.0 in | Wt 218.0 lb

## 2017-04-10 DIAGNOSIS — G4733 Obstructive sleep apnea (adult) (pediatric): Secondary | ICD-10-CM | POA: Diagnosis not present

## 2017-04-10 DIAGNOSIS — J431 Panlobular emphysema: Secondary | ICD-10-CM | POA: Diagnosis not present

## 2017-04-10 DIAGNOSIS — E118 Type 2 diabetes mellitus with unspecified complications: Secondary | ICD-10-CM

## 2017-04-10 DIAGNOSIS — I2510 Atherosclerotic heart disease of native coronary artery without angina pectoris: Secondary | ICD-10-CM | POA: Diagnosis not present

## 2017-04-10 DIAGNOSIS — Z01818 Encounter for other preprocedural examination: Secondary | ICD-10-CM

## 2017-04-10 DIAGNOSIS — I10 Essential (primary) hypertension: Secondary | ICD-10-CM | POA: Diagnosis not present

## 2017-04-10 NOTE — Progress Notes (Signed)
Subjective:    Tricia [name] is a 72 y.o. female who presents to the office today for a preoperative consultation at the request of her orthopedic surgeon who plans on performing a right shoulder scope during the next 2-3 months. This consultation is requested for the medical optimization. The patient has the following known anesthesia issues: "difficulty wakening" per patient. Patient has a bleeding risk of: use of ASA, Plavix, and CCB. Patient does not have objections to receiving blood products if needed.  Current Outpatient Medications:  .  albuterol (PROVENTIL HFA;VENTOLIN HFA) 108 (90 Base) MCG/ACT inhaler, Inhale 2 puffs into the lungs every 6 (six) hours as needed for wheezing or shortness of breath., Disp: 1 Inhaler, Rfl: 3 .  amLODipine (NORVASC) 5 MG tablet, Take 1 tablet (5 mg total) by mouth daily., Disp: 90 tablet, Rfl: 3 .  aspirin EC 81 MG tablet, Take 81 mg by mouth daily., Disp: , Rfl:  .  atorvastatin (LIPITOR) 80 MG tablet, TAKE 1 TABLET (80 MG TOTAL) BY MOUTH DAILY., Disp: 90 tablet, Rfl: 3 .  Biotin 10000 MCG TABS, Take 10,000 mcg by mouth daily. Dissolvable tablets, Disp: , Rfl:  .  carvedilol (COREG) 6.25 MG tablet, Take 1 tablet (6.25 mg total) by mouth 2 (two) times daily with a meal., Disp: 180 tablet, Rfl: 3 .  Cholecalciferol (VITAMIN D3) 2000 units CHEW, Chew 2,000 Units by mouth daily., Disp: , Rfl:  .  clopidogrel (PLAVIX) 75 MG tablet, TAKE 1 TABLET (75 MG TOTAL) BY MOUTH DAILY., Disp: 30 tablet, Rfl: 11 .  fluticasone (FLONASE) 50 MCG/ACT nasal spray, Place 2 sprays into both nostrils as needed for allergies or rhinitis., Disp: , Rfl:  .  furosemide (LASIX) 20 MG tablet, Take one by mouth daily as needed for edema., Disp: 20 tablet, Rfl: 1 .  gabapentin (NEURONTIN) 100 MG capsule, Take 1 capsule (100 mg total) by mouth 3 (three) times daily., Disp: 90 capsule, Rfl: 1 .  HYDROcodone-acetaminophen (NORCO) 10-325 MG tablet, Take 1 tablet by mouth every 8 (eight)  hours as needed., Disp: 30 tablet, Rfl: 0 .  Multiple Vitamin (MULTIVITAMIN WITH MINERALS) TABS tablet, Take 1 tablet by mouth daily. Women's One a Day, Disp: , Rfl:  .  nitroGLYCERIN (NITROLINGUAL) 0.4 MG/SPRAY spray, Place 1 spray under the tongue every 5 (five) minutes x 3 doses as needed for chest pain., Disp: 4.9 g, Rfl: 3 .  pantoprazole (PROTONIX) 40 MG tablet, TAKE 1 TABLET (40 MG TOTAL) BY MOUTH DAILY., Disp: 30 tablet, Rfl: 11 .  potassium chloride SA (K-DUR,KLOR-CON) 20 MEQ tablet, Take with Lasix dose., Disp: 20 tablet, Rfl: 1 .  valsartan (DIOVAN) 320 MG tablet, TAKE 1 TABLET (320 MG TOTAL) BY MOUTH DAILY., Disp: 30 tablet, Rfl: 3  Patient Active Problem List   Diagnosis Date Noted  . Hyperparathyroidism, secondary ([place]) 07/06/2016  . Right renal atrophy 04/19/2016  . COPD (chronic obstructive pulmonary disease) ([place]) 04/19/2016  . Aortic atherosclerosis ([place]) 04/19/2016  . Atherosclerosis of arteries 04/19/2016  . Sleep apnea   . Diverticulosis   . Depression   . Anxiety   . Coronary artery disease involving native coronary artery of native heart without angina pectoris 03/10/2016  . Rib pain - s/p CPR 10/08/2015  . History of cardiac arrest 10/01/2015  . History of ST elevation myocardial infarction (STEMI) 10/01/2015  . Second hand smoke exposure 07/28/2015  . Postoperative malabsorption 07/27/2015  . Recurrent UTI 03/14/2014  . GERD (gastroesophageal reflux disease) 02/14/2014  .  History of gastric bypass 02/14/2014  . Former smoker 02/14/2014  . Diabetes mellitus type II, controlled ([place]) 02/14/2014  . Anxiety state 10/03/2009  . Anemia, B12 deficiency 04/04/2009  . Osteoporosis 12/14/2006  . Hyperlipidemia 09/22/2006  . Essential hypertension 09/22/2006  . Osteoarthritis 09/22/2006  . Low back pain 09/22/2006  . Adenomatous polyp of colon 02/04/2004   Past Surgical History:  Procedure Laterality Date  . ABDOMINAL HYSTERECTOMY    . APPENDECTOMY    .  BARIATRIC SURGERY    . CARDIAC CATHETERIZATION N/A 10/01/2015   Procedure: Left Heart Cath and Coronary Angiography;  Surgeon: Leonie Man, MD;  Location: [facility];  Service: Cardiovascular;  Laterality: N/A;  . CARDIAC CATHETERIZATION N/A 10/01/2015   Procedure: Coronary Stent Intervention;  Surgeon: Leonie Man, MD;  Location: [facility];  Service: Cardiovascular;  Laterality: N/A;  . CARDIAC CATHETERIZATION N/A 10/03/2015   Procedure: Coronary Stent Intervention;  Surgeon: Troy Sine, MD;  Location: [facility];  Service: Cardiovascular;  Laterality: N/A;  . CARDIAC CATHETERIZATION N/A 10/03/2015   Procedure: Coronary/Graft Angiography;  Surgeon: Troy Sine, MD;  Location: [facility];  Service: Cardiovascular;  Laterality: N/A;  . CARPAL TUNNEL RELEASE Bilateral   . CERVICAL DISC SURGERY    . CHOLECYSTECTOMY    . CORONARY ANGIOPLASTY  2002  . HERNIA REPAIR    . LUMBAR LAMINECTOMY     L3-4  . NEPHRECTOMY     Partial  . TONSILLECTOMY    . TOTAL KNEE ARTHROPLASTY Left   . TUBAL LIGATION    . ULNAR NERVE REPAIR     Social History   Socioeconomic History  . Marital status: Married    Spouse name: Not on file  . Number of children: Not on file  . Years of education: Not on file  . Highest education level: Not on file  Social Needs  . Financial resource strain: Not on file  . Food insecurity - worry: Not on file  . Food insecurity - inability: Not on file  . Transportation needs - medical: Not on file  . Transportation needs - non-medical: Not on file  Occupational History  . Occupation: retired    Fish farm manager: UNEMPLOYED  Tobacco Use  . Smoking status: Former Smoker    Packs/day: 2.00    Years: 41.00    Pack years: 82.00    Types: Cigarettes    Last attempt to quit: 03/06/2008    Years since quitting: 9.1  . Smokeless tobacco: Never Used  Substance and Sexual Activity  . Alcohol use: No    Alcohol/week: 0.0 oz  . Drug use: No    . Sexual activity: Yes  Other Topics Concern  . Not on file  Social History Narrative   Widowed in 04/2012. 2 children. 3 grandkids. Lives alone. Completely indendent. Disabled/retired. Disabled-lifted computer paper. Hobbies: spend money-gamble.   The following portions of the patient's history were reviewed and updated as appropriate: allergies, current medications, past family history, past medical history, past social history, past surgical history and problem list.  Review of Systems Pertinent items noted in HPI and remainder of comprehensive ROS otherwise negative.    Objective:   Physical Exam    General Appearance: Awake, alert, oriented, in no acute distress, well developed, well nourished and in no acute distress.  Head: NCAT.  Eyes: No gross abnormalities.  Ears: Canals and TMs WNL.  Nose/Sinuses:  Nares normal. Septum midline. Mucosa normal. No  drainage or sinus tenderness.  Mouth/Throat: Mucosa moist, no lesions; pharynx without erythema, edema or exudate.  Neck: Supple, no mass, non-tender.  Heart: Heart sounds are normal.  Regular rate and rhythm.  Lungs: Normal expansion.  Clear to auscultation.  No rales, rhonchi, or wheezing.  Abdomen:  Soft, non-tender, normal bowel sounds; no bruits, organomegaly or masses.  Extremities: Extremities warm to touch, pink, with no edema.  Peripheral Pulses: Capillary refill <2 secs, strong peripheral pulses.  Neurologic: Alert and oriented x 3, gait normal., reflexes normal and symmetric, strength and  sensation grossly normal.  Musculoskeletal:  Range of motion normal in hips, knees, shoulders, and spine., No joint swelling, deformity, or tenderness.  Psych: Alert,oriented, in NAD with a full range of affect, normal behavior and no psychotic features.  Skin: There are no suspicious lesions or rashes of concern.   Cardiographics ECG (04/03/17): Normal sinus rhythm, heart rate 62, left axis deviation, QTC 422 ms, PVCs, similar to prior  tracings. Echocardiogram (10/02/15): EF 60-65%, normal wall motion, mild AI, mild RAE, mild to moderate TR.  Imaging EXAM: CHEST  2 VIEW  COMPARISON:  Radiographs of April 29, 2016.  FINDINGS: The heart size and mediastinal contours are within normal limits. Atherosclerosis of thoracic aorta is noted. No pneumothorax or pleural effusion is noted. No acute pulmonary disease is noted. The visualized skeletal structures are unremarkable.  IMPRESSION: No active cardiopulmonary disease.  Aortic atherosclerosis.  Lab Review  Results for orders placed or performed in visit on 32/35/57  Basic metabolic panel  Result Value Ref Range   Glucose 108 (H) 65 - 99 mg/dL   BUN 16 8 - 27 mg/dL   Creatinine, Ser 0.87 0.57 - 1.00 mg/dL   GFR calc non Af Amer 68 >59 mL/min/1.73   GFR calc Af Amer 79 >59 mL/min/1.73   BUN/Creatinine Ratio 18 12 - 28   Sodium 140 134 - 144 mmol/L   Potassium 4.9 3.5 - 5.2 mmol/L   Chloride 105 96 - 106 mmol/L   CO2 23 20 - 29 mmol/L   Calcium 8.9 8.7 - 10.3 mg/dL  Magnesium  Result Value Ref Range   Magnesium 2.3 1.6 - 2.3 mg/dL   Lab Results  Component Value Date   HGBA1C 6.0 02/18/2017    Assessment:   73 y.o. female with planned surgery as above.   Known risk factors for perioperative complications: Coronary disease Chronic pulmonary disease Diabetes mellitus     Plan:   1. Preoperative workup as follows LABS, MEDICATIONS, CARDIOLOGY NOTES REVIEWED. 2. Change in medication regimen before surgery: none, continue medication regimen including morning of surgery, with sip of water.  Briscoe Deutscher

## 2017-04-14 ENCOUNTER — Other Ambulatory Visit: Payer: Self-pay | Admitting: Family Medicine

## 2017-04-14 DIAGNOSIS — M5441 Lumbago with sciatica, right side: Principal | ICD-10-CM

## 2017-04-14 DIAGNOSIS — G8929 Other chronic pain: Secondary | ICD-10-CM

## 2017-04-15 ENCOUNTER — Encounter: Payer: Self-pay | Admitting: Gastroenterology

## 2017-04-16 ENCOUNTER — Encounter: Payer: Self-pay | Admitting: Physician Assistant

## 2017-04-16 ENCOUNTER — Other Ambulatory Visit: Payer: Self-pay | Admitting: *Deleted

## 2017-04-16 DIAGNOSIS — I493 Ventricular premature depolarization: Secondary | ICD-10-CM

## 2017-04-16 HISTORY — DX: Ventricular premature depolarization: I49.3

## 2017-04-16 MED ORDER — CARVEDILOL 6.25 MG PO TABS: ORAL_TABLET | ORAL | 3 refills | Status: DC

## 2017-04-16 MED ORDER — CARVEDILOL 3.125 MG PO TABS: ORAL_TABLET | ORAL | 3 refills | Status: DC

## 2017-04-17 ENCOUNTER — Ambulatory Visit (INDEPENDENT_AMBULATORY_CARE_PROVIDER_SITE_OTHER)
Admission: RE | Admit: 2017-04-17 | Discharge: 2017-04-17 | Disposition: A | Discharge status: Home / Self Care | Payer: PPO | Source: Ambulatory Visit | Attending: Acute Care | Admitting: Acute Care

## 2017-04-17 DIAGNOSIS — Z87891 Personal history of nicotine dependence: Secondary | ICD-10-CM | POA: Diagnosis not present

## 2017-04-21 NOTE — Telephone Encounter (Signed)
I called patient in regards to another appointment and she stated that she is still waiting on an update regarding the message below about her prolia injections. I told the patient that Cleda Clarks was out today however, I would send the message on her behalf for someone to follow up with her.   Contact patient with an update.

## 2017-04-22 NOTE — Telephone Encounter (Signed)
Patient informed to hold Plavix 5 days prior to her procedure per Cardiology. Patient verbalized understanding.

## 2017-04-22 NOTE — Telephone Encounter (Signed)
Patient calling to find out when she needs to come off BT for procedure next Wednesday 3.27.19.

## 2017-04-23 ENCOUNTER — Other Ambulatory Visit: Payer: Self-pay | Admitting: Acute Care

## 2017-04-23 DIAGNOSIS — Z87891 Personal history of nicotine dependence: Secondary | ICD-10-CM

## 2017-04-23 DIAGNOSIS — Z122 Encounter for screening for malignant neoplasm of respiratory organs: Secondary | ICD-10-CM

## 2017-04-27 ENCOUNTER — Other Ambulatory Visit: Payer: Self-pay | Admitting: Family Medicine

## 2017-04-27 ENCOUNTER — Ambulatory Visit: Payer: PPO

## 2017-04-29 ENCOUNTER — Other Ambulatory Visit: Payer: Self-pay

## 2017-04-29 ENCOUNTER — Ambulatory Visit (AMBULATORY_SURGERY_CENTER): Payer: PPO | Admitting: Gastroenterology

## 2017-04-29 ENCOUNTER — Encounter: Payer: Self-pay | Admitting: Gastroenterology

## 2017-04-29 VITALS — BP 150/79 | HR 54 | Temp 97.3°F | Resp 11 | Ht 62.0 in | Wt 216.0 lb

## 2017-04-29 DIAGNOSIS — G4733 Obstructive sleep apnea (adult) (pediatric): Secondary | ICD-10-CM | POA: Diagnosis not present

## 2017-04-29 DIAGNOSIS — K921 Melena: Secondary | ICD-10-CM

## 2017-04-29 DIAGNOSIS — D123 Benign neoplasm of transverse colon: Secondary | ICD-10-CM

## 2017-04-29 DIAGNOSIS — Z6841 Body Mass Index (BMI) 40.0 and over, adult: Secondary | ICD-10-CM | POA: Diagnosis not present

## 2017-04-29 DIAGNOSIS — I10 Essential (primary) hypertension: Secondary | ICD-10-CM | POA: Diagnosis not present

## 2017-04-29 DIAGNOSIS — J449 Chronic obstructive pulmonary disease, unspecified: Secondary | ICD-10-CM | POA: Diagnosis not present

## 2017-04-29 DIAGNOSIS — Z86010 Personal history of colonic polyps: Secondary | ICD-10-CM | POA: Diagnosis not present

## 2017-04-29 DIAGNOSIS — E119 Type 2 diabetes mellitus without complications: Secondary | ICD-10-CM | POA: Diagnosis not present

## 2017-04-29 MED ORDER — SODIUM CHLORIDE 0.9 % IV SOLN: 500.0000 mL | Freq: Once | INTRAVENOUS | Status: DC

## 2017-04-29 NOTE — Patient Instructions (Signed)
Handouts given for polyps, diverticulosis, hemorrhoids and high fiber diet.  You may resume your Plavix tomorrow at the regular dose.  YOU HAD AN ENDOSCOPIC PROCEDURE TODAY AT [facility]:   Refer to the procedure report that was given to you for any specific questions about what was found during the examination.  If the procedure report does not answer your questions, please call your gastroenterologist to clarify.  If you requested that your care partner not be given the details of your procedure findings, then the procedure report has been included in a sealed envelope for you to review at your convenience later.  YOU SHOULD EXPECT: Some feelings of bloating in the abdomen. Passage of more gas than usual.  Walking can help get rid of the air that was put into your GI tract during the procedure and reduce the bloating. If you had a lower endoscopy (such as a colonoscopy or flexible sigmoidoscopy) you may notice spotting of blood in your stool or on the toilet paper. If you underwent a bowel prep for your procedure, you may not have a normal bowel movement for a few days.  Please Note:  You might notice some irritation and congestion in your nose or some drainage.  This is from the oxygen used during your procedure.  There is no need for concern and it should clear up in a day or so.  SYMPTOMS TO REPORT IMMEDIATELY:   Following lower endoscopy (colonoscopy or flexible sigmoidoscopy):  Excessive amounts of blood in the stool  Significant tenderness or worsening of abdominal pains  Swelling of the abdomen that is new, acute  Fever of 100F or higher   For urgent or emergent issues, a gastroenterologist can be reached at any hour by calling [phone].   DIET:  We do recommend a small meal at first, but then you may proceed to your regular diet.  Drink plenty of fluids but you should avoid alcoholic beverages for 24 hours.  ACTIVITY:  You should plan to take it easy for  the rest of today and you should NOT DRIVE or use heavy machinery until tomorrow (because of the sedation medicines used during the test).    FOLLOW UP: Our staff will call the number listed on your records the next business day following your procedure to check on you and address any questions or concerns that you may have regarding the information given to you following your procedure. If we do not reach you, we will leave a message.  However, if you are feeling well and you are not experiencing any problems, there is no need to return our call.  We will assume that you have returned to your regular daily activities without incident.  If any biopsies were taken you will be contacted by phone or by letter within the next 1-3 weeks.  Please call us at [phone] if you have not heard about the biopsies in 3 weeks.    SIGNATURES/CONFIDENTIALITY: You and/or your care partner have signed paperwork which will be entered into your electronic medical record.  These signatures attest to the fact that that the information above on your After Visit Summary has been reviewed and is understood.  Full responsibility of the confidentiality of this discharge information lies with you and/or your care-partner.

## 2017-04-29 NOTE — Op Note (Signed)
[facility] Patient Name: Tricia [name] Procedure Date: 04/29/2017 2:17 PM MRN: [id number] Endoscopist: Ladene Artist , MD Age: 72 Referring MD:  Date of Birth: 04-02-1945 Gender: Female Account #: [id number] Procedure:                Colonoscopy Indications:              Surveillance: Personal history of adenomatous                            polyps on last colonoscopy 5 years ago Medicines:                Monitored Anesthesia Care Procedure:                Pre-Anesthesia Assessment:                           - Prior to the procedure, a History and Physical                            was performed, and patient medications and                            allergies were reviewed. The patient's tolerance of                            previous anesthesia was also reviewed. The risks                            and benefits of the procedure and the sedation                            options and risks were discussed with the patient.                            All questions were answered, and informed consent                            was obtained. Prior Anticoagulants: The patient has                            taken Plavix (clopidogrel), last dose was 5 days                            prior to procedure. ASA Grade Assessment: III - A                            patient with severe systemic disease. After                            reviewing the risks and benefits, the patient was                            deemed in satisfactory condition to undergo the  procedure.                           After obtaining informed consent, the colonoscope                            was passed under direct vision. Throughout the                            procedure, the patient's blood pressure, pulse, and                            oxygen saturations were monitored continuously. The                            Model PCF-[id number]) scope was introduced                      through the anus and advanced to the the cecum,                            identified by appendiceal orifice and ileocecal                            valve. The ileocecal valve, appendiceal orifice,                            and rectum were photographed. The quality of the                            bowel preparation was excellent. The colonoscopy                            was performed without difficulty. The patient                            tolerated the procedure well. Scope In: 2:28:24 PM Scope Out: 2:44:43 PM Scope Withdrawal Time: 0 hours 11 minutes 10 seconds  Total Procedure Duration: 0 hours 16 minutes 19 seconds  Findings:                 The perianal and digital rectal examinations were                            normal.                           Four sessile polyps were found in the transverse                            colon. The polyps were 6 to 8 mm in size. These                            polyps were removed with a cold snare. Resection  and retrieval were complete.                           Multiple medium-mouthed diverticula were found in                            the left colon. There was narrowing of the colon in                            association with the diverticular opening. There                            was no evidence of diverticular bleeding.                           Internal hemorrhoids were found during                            retroflexion. The hemorrhoids were small and Grade                            I (internal hemorrhoids that do not prolapse).                           The exam was otherwise without abnormality on                            direct and retroflexion views. Complications:            No immediate complications. Estimated blood loss:                            None. Estimated Blood Loss:     Estimated blood loss: none. Impression:               - Four 6 to 8 mm polyps in the  transverse colon,                            removed with a cold snare. Resected and retrieved.                           - Moderate diverticulosis in the left colon. There                            was narrowing of the colon in association with the                            diverticular opening. There was no evidence of                            diverticular bleeding.                           - Small internal hemorrhoids.                           -  The examination was otherwise normal on direct                            and retroflexion views. Recommendation:           - Repeat colonoscopy in 3 - 5 years for                            surveillance.                           - Resume Plavix (clopidogrel) tomorrow at prior                            dose. Refer to managing physician for further                            adjustment of therapy.                           - Patient has a contact number available for                            emergencies. The signs and symptoms of potential                            delayed complications were discussed with the                            patient. Return to normal activities tomorrow.                            Written discharge instructions were provided to the                            patient.                           - High fiber diet.                           - Continue present medications.                           - Await pathology results. Ladene Artist, MD 04/29/2017 2:49:22 PM This report has been signed electronically.

## 2017-04-29 NOTE — Progress Notes (Signed)
Report to RN, VSS, adequate respirations noted, no c/o pain or discomfort 

## 2017-04-29 NOTE — Progress Notes (Signed)
Called to room to assist during endoscopic procedure.  Patient ID and intended procedure confirmed with present staff. Received instructions for my participation in the procedure from the performing physician.  

## 2017-04-30 ENCOUNTER — Ambulatory Visit
Admission: RE | Admit: 2017-04-30 | Discharge: 2017-04-30 | Disposition: A | Discharge status: Home / Self Care | Payer: PPO | Source: Ambulatory Visit | Attending: Family Medicine | Admitting: Family Medicine

## 2017-04-30 ENCOUNTER — Telehealth: Payer: Self-pay | Admitting: *Deleted

## 2017-04-30 DIAGNOSIS — Z87891 Personal history of nicotine dependence: Secondary | ICD-10-CM

## 2017-04-30 DIAGNOSIS — Z136 Encounter for screening for cardiovascular disorders: Secondary | ICD-10-CM | POA: Diagnosis not present

## 2017-04-30 NOTE — Telephone Encounter (Signed)
  Follow up Call-  Call back number 04/29/2017  Post procedure Call Back phone  # [phone]  Permission to leave phone message Yes  Some recent data might be hidden     Patient questions:  Do you have a fever, pain , or abdominal swelling? No. Pain Score  0 *  Have you tolerated food without any problems? Yes.    Have you been able to return to your normal activities? Yes.    Do you have any questions about your discharge instructions: Diet   No. Medications  No. Follow up visit  No.  Do you have questions or concerns about your Care? No.  Actions: * If pain score is 4 or above: No action needed, pain <4.

## 2017-05-01 NOTE — Telephone Encounter (Signed)
Called patient and she is aware

## 2017-05-01 NOTE — Telephone Encounter (Signed)
Forms have been submitted to [employer] along with copies of patient's statements. Awaiting response at this time

## 2017-05-04 ENCOUNTER — Telehealth: Payer: Self-pay

## 2017-05-04 NOTE — Telephone Encounter (Signed)
Copied from [place] [id number]. Topic: General - Other >> May 04, 2017 10:39 AM Margot Ables wrote: Reason for CRM: pt calling about prolia shot - she had it already and states [employer]ary Signs is trying to get the money back from the foundation - she is wanting to get an email address to send info to. Please advise.

## 2017-05-06 ENCOUNTER — Encounter: Payer: Self-pay | Admitting: Gastroenterology

## 2017-05-26 ENCOUNTER — Other Ambulatory Visit: Payer: Self-pay | Admitting: Nurse Practitioner

## 2017-06-01 ENCOUNTER — Other Ambulatory Visit: Payer: Self-pay | Admitting: Family Medicine

## 2017-06-01 DIAGNOSIS — M150 Primary generalized (osteo)arthritis: Principal | ICD-10-CM

## 2017-06-01 DIAGNOSIS — M8949 Other hypertrophic osteoarthropathy, multiple sites: Secondary | ICD-10-CM

## 2017-06-01 DIAGNOSIS — M159 Polyosteoarthritis, unspecified: Secondary | ICD-10-CM

## 2017-06-01 NOTE — Telephone Encounter (Signed)
Ok to refill 

## 2017-06-02 NOTE — Telephone Encounter (Signed)
Has app on 5/31 do we need to see sooner?

## 2017-06-02 NOTE — Telephone Encounter (Signed)
Needs visit for pain management only - q 3 months, with drug screen and contract.

## 2017-06-03 MED ORDER — HYDROCODONE-ACETAMINOPHEN 10-325 MG PO TABS: 1.0000 | ORAL_TABLET | Freq: Three times a day (TID) | ORAL | 0 refills | Status: DC | PRN

## 2017-06-03 NOTE — Telephone Encounter (Signed)
Called patient and let her know ready for pick up.

## 2017-06-10 ENCOUNTER — Other Ambulatory Visit: Payer: Self-pay | Admitting: Family Medicine

## 2017-06-10 DIAGNOSIS — M5441 Lumbago with sciatica, right side: Principal | ICD-10-CM

## 2017-06-10 DIAGNOSIS — G8929 Other chronic pain: Secondary | ICD-10-CM

## 2017-06-18 ENCOUNTER — Telehealth: Payer: Self-pay

## 2017-06-18 NOTE — Telephone Encounter (Signed)
Copied from [place] [phone]. Topic: General - Other >> May 04, 2017 10:39 AM Margot Ables wrote: Reason for CRM: pt calling about prolia shot - she had it already and states [employer] is trying to get the money back from the foundation - she is wanting to get an email address to send info to. Please advise. >> Jun 18, 2017 10:40 AM Yvette Rack wrote: Pt calling to speak with Roselyn Reef about the billing for proliea she's very upset

## 2017-06-18 NOTE — Telephone Encounter (Signed)
Called and spoke with patient. She is upset as no one has reached out to her from the [facility] regarding a bill that is over a year old. Her insurance has paid their part but she is upset as she has not heard anything else regarding the [employer] paying the rest. I explained that I had faxed the bills (that she had brought to the office)and completed paperwork for the [employer] but had not heard anything else in response.  I encouraged patient to log in to the portal where she was able to access what payments had been made. She states as of April 08, 2017 her card is showing inactive. I asked her if she had reapplied for this year and she stated yes. She wanted to know why we have not submitted a bill to [employer] but I told her that I can't submit a bill until she brings it in.   She is coming in the office in the morning to bring her bill from December.

## 2017-06-22 ENCOUNTER — Telehealth: Payer: Self-pay | Admitting: Family Medicine

## 2017-06-22 NOTE — Telephone Encounter (Signed)
See note.   Copied from [place] [id number]. Topic: General - Other >> Jun 22, 2017 12:23 PM Lennox Solders wrote: Reason for CRM:pt is calling to notify the office she has new pharm [facility] [phone] and they will be requesting new rxs

## 2017-06-22 NOTE — Telephone Encounter (Signed)
FYI

## 2017-06-23 ENCOUNTER — Other Ambulatory Visit: Payer: Self-pay

## 2017-06-23 MED ORDER — CARVEDILOL 3.125 MG PO TABS: ORAL_TABLET | ORAL | 3 refills | Status: DC

## 2017-06-23 MED ORDER — AMLODIPINE BESYLATE 5 MG PO TABS: 5.0000 mg | ORAL_TABLET | Freq: Every day | ORAL | 3 refills | Status: DC

## 2017-06-23 MED ORDER — CARVEDILOL 6.25 MG PO TABS: ORAL_TABLET | ORAL | 3 refills | Status: DC

## 2017-06-24 ENCOUNTER — Other Ambulatory Visit: Payer: Self-pay | Admitting: Family Medicine

## 2017-06-24 ENCOUNTER — Other Ambulatory Visit: Payer: Self-pay | Admitting: Physician Assistant

## 2017-06-24 DIAGNOSIS — G8929 Other chronic pain: Secondary | ICD-10-CM

## 2017-06-24 DIAGNOSIS — M5441 Lumbago with sciatica, right side: Principal | ICD-10-CM

## 2017-06-24 MED ORDER — ATORVASTATIN CALCIUM 80 MG PO TABS: 80.0000 mg | ORAL_TABLET | Freq: Every day | ORAL | 2 refills | Status: DC

## 2017-06-24 MED ORDER — PANTOPRAZOLE SODIUM 40 MG PO TBEC: 40.0000 mg | DELAYED_RELEASE_TABLET | Freq: Every day | ORAL | 2 refills | Status: DC

## 2017-06-24 MED ORDER — CLOPIDOGREL BISULFATE 75 MG PO TABS
75.0000 mg | ORAL_TABLET | Freq: Every day | ORAL | 2 refills | Status: DC
Start: 2017-06-24 — End: 2018-03-30

## 2017-06-24 NOTE — Telephone Encounter (Signed)
Copied from [place] [phone]. Topic: Quick Communication - Rx Refill/Question >> Jun 24, 2017  1:49 PM Arletha Grippe wrote: Medication: gabapentin (NEURONTIN) 100 MG capsule, valsartan (DIOVAN) 320 MG tablet  Has the patient contacted their pharmacy? Yes.   (Agent: If no, request that the patient contact the pharmacy for the refill.) (Agent: If yes, when and what did the pharmacy advise?)  Preferred Pharmacy (with phone number or street name): [facility] [phone] option 3    Agent: Please be advised that RX refills may take up to 3 business days. We ask that you follow-up with your pharmacy.

## 2017-06-24 NOTE — Telephone Encounter (Signed)
Gabapentin and Valsartan refills Last OV:04/10/17 Last refill:Gabapentin 06/10/17 270 tab/1 refill; Valsartan 04/27/17 30 tab/3 refill YOF:[id number] Pharmacy: [place], [place] [phone] (Phone) [phone] (Fax)

## 2017-06-25 NOTE — Telephone Encounter (Signed)
See request °

## 2017-06-26 MED ORDER — VALSARTAN 320 MG PO TABS: 320.0000 mg | ORAL_TABLET | Freq: Every day | ORAL | 3 refills | Status: DC

## 2017-06-26 MED ORDER — GABAPENTIN 100 MG PO CAPS: ORAL_CAPSULE | ORAL | 1 refills | Status: DC

## 2017-06-26 NOTE — Telephone Encounter (Signed)
Ok to refill 

## 2017-07-02 ENCOUNTER — Ambulatory Visit: Payer: PPO | Admitting: *Deleted

## 2017-07-02 ENCOUNTER — Encounter: Payer: PPO | Admitting: Family Medicine

## 2017-07-03 ENCOUNTER — Ambulatory Visit (INDEPENDENT_AMBULATORY_CARE_PROVIDER_SITE_OTHER): Payer: PPO | Admitting: Family Medicine

## 2017-07-03 ENCOUNTER — Encounter: Payer: Self-pay | Admitting: Family Medicine

## 2017-07-03 VITALS — BP 138/84 | HR 61 | Temp 98.1°F | Ht 62.0 in | Wt 219.6 lb

## 2017-07-03 DIAGNOSIS — K912 Postsurgical malabsorption, not elsewhere classified: Secondary | ICD-10-CM

## 2017-07-03 DIAGNOSIS — E118 Type 2 diabetes mellitus with unspecified complications: Secondary | ICD-10-CM

## 2017-07-03 DIAGNOSIS — I10 Essential (primary) hypertension: Secondary | ICD-10-CM | POA: Diagnosis not present

## 2017-07-03 DIAGNOSIS — I700 Atherosclerosis of aorta: Secondary | ICD-10-CM

## 2017-07-03 DIAGNOSIS — R635 Abnormal weight gain: Secondary | ICD-10-CM | POA: Diagnosis not present

## 2017-07-03 DIAGNOSIS — Z0000 Encounter for general adult medical examination without abnormal findings: Secondary | ICD-10-CM

## 2017-07-03 DIAGNOSIS — N2581 Secondary hyperparathyroidism of renal origin: Secondary | ICD-10-CM | POA: Diagnosis not present

## 2017-07-03 DIAGNOSIS — M816 Localized osteoporosis [Lequesne]: Secondary | ICD-10-CM | POA: Diagnosis not present

## 2017-07-03 DIAGNOSIS — E7800 Pure hypercholesterolemia, unspecified: Secondary | ICD-10-CM

## 2017-07-03 DIAGNOSIS — Z9884 Bariatric surgery status: Secondary | ICD-10-CM

## 2017-07-03 LAB — CBC WITH DIFFERENTIAL/PLATELET
Basophils Absolute: 0 10*3/uL (ref 0.0–0.1)
Basophils Relative: 0.7 % (ref 0.0–3.0)
Eosinophils Absolute: 0.2 10*3/uL (ref 0.0–0.7)
Eosinophils Relative: 3.3 % (ref 0.0–5.0)
HCT: 38.8 % (ref 36.0–46.0)
Hemoglobin: 12.7 g/dL (ref 12.0–15.0)
Lymphocytes Relative: 27.3 % (ref 12.0–46.0)
Lymphs Abs: 1.4 10*3/uL (ref 0.7–4.0)
MCHC: 32.7 g/dL (ref 30.0–36.0)
MCV: 78.9 fl (ref 78.0–100.0)
Monocytes Absolute: 0.4 10*3/uL (ref 0.1–1.0)
Monocytes Relative: 6.7 % (ref 3.0–12.0)
Neutro Abs: 3.3 10*3/uL (ref 1.4–7.7)
Neutrophils Relative %: 62 % (ref 43.0–77.0)
Platelets: 224 10*3/uL (ref 150.0–400.0)
RBC: 4.92 Mil/uL (ref 3.87–5.11)
RDW: 14.4 % (ref 11.5–15.5)
WBC: 5.3 10*3/uL (ref 4.0–10.5)

## 2017-07-03 LAB — COMPREHENSIVE METABOLIC PANEL
ALT: 10 U/L (ref 0–35)
AST: 15 U/L (ref 0–37)
Albumin: 4.2 g/dL (ref 3.5–5.2)
Alkaline Phosphatase: 99 U/L (ref 39–117)
BUN: 22 mg/dL (ref 6–23)
CO2: 26 mEq/L (ref 19–32)
Calcium: 9.6 mg/dL (ref 8.4–10.5)
Chloride: 107 mEq/L (ref 96–112)
Creatinine, Ser: 0.96 mg/dL (ref 0.40–1.20)
GFR: 61.1 mL/min (ref >60.00)
Glucose, Bld: 127 mg/dL — ABNORMAL HIGH (ref 70–99)
Potassium: 4.7 mEq/L (ref 3.5–5.1)
Sodium: 142 mEq/L (ref 135–145)
Total Bilirubin: 0.5 mg/dL (ref 0.2–1.2)
Total Protein: 6.9 g/dL (ref 6.0–8.3)

## 2017-07-03 LAB — LIPID PANEL
Cholesterol: 162 mg/dL (ref 0–200)
HDL: 48.6 mg/dL (ref >39.00)
LDL Cholesterol: 91 mg/dL (ref 0–99)
NonHDL: 113.69
Total CHOL/HDL Ratio: 3
Triglycerides: 115 mg/dL (ref 0.0–149.0)
VLDL: 23 mg/dL (ref 0.0–40.0)

## 2017-07-03 LAB — FERRITIN: Ferritin: 12.7 ng/mL (ref 10.0–291.0)

## 2017-07-03 LAB — HEMOGLOBIN A1C: Hgb A1c MFr Bld: 6.3 % (ref 4.6–6.5)

## 2017-07-03 NOTE — Progress Notes (Signed)
Tricia [name] is a 72 y.o. female is here for follow up.  History of Present Illness:   Shaune Pascal CMA acting as scribe for Dr. Juleen China  HPI: Patient comes in today for her yearly check up.   Diabetes: Patient was diagnosed with diabetes before her gastric bypass surgery in 2003. She has been in the pre diabetes range since then. She watches her diet and tries to exercise to keep sugars under control.   Cough: Patient records herself while she is sleeping. She has been told in the past that she has the beginnings of COPD. Patient does not believe she does.   Health Maintenance Due  Topic Date Due  . OPHTHALMOLOGY EXAM  07/04/2016  . FOOT EXAM  04/02/2017   Depression screen [facility] 2/9 07/03/2017 03/10/2016 12/03/2015  Decreased Interest 0 0 0  Down, Depressed, Hopeless 0 0 0  PHQ - 2 Score 0 0 0  Altered sleeping - - -  Tired, decreased energy - - -  Change in appetite - - -  Feeling bad or failure about yourself  - - -  Trouble concentrating - - -  Moving slowly or fidgety/restless - - -  Suicidal thoughts - - -  PHQ-9 Score - - -  Difficult doing work/chores - - -  Some recent data might be hidden   PMHx, SurgHx, SocialHx, FamHx, Medications, and Allergies were reviewed in the Visit Navigator and updated as appropriate.   Patient Active Problem List   Diagnosis Date Noted  . PVC's (premature ventricular contractions) 04/16/2017  . Hyperparathyroidism, secondary ([place]) 07/06/2016  . Right renal atrophy 04/19/2016  . COPD (chronic obstructive pulmonary disease) ([place]) 04/19/2016  . Aortic atherosclerosis ([place]) 04/19/2016  . Atherosclerosis of arteries 04/19/2016  . Sleep apnea, not on CPAP   . Diverticulosis   . Coronary artery disease involving native coronary artery of native heart without angina pectoris 03/10/2016  . Rib pain - s/p CPR 10/08/2015  . History of cardiac arrest 10/01/2015  . History of ST elevation myocardial infarction (STEMI) 10/01/2015  . Second  hand smoke exposure 07/28/2015  . Postoperative malabsorption 07/27/2015  . Recurrent UTI 03/14/2014  . GERD (gastroesophageal reflux disease) 02/14/2014  . History of gastric bypass 02/14/2014  . Former smoker 02/14/2014  . Diabetes mellitus type II, controlled ([place]) 02/14/2014  . Anemia, B12 deficiency 04/04/2009  . Osteoporosis 12/14/2006  . Hyperlipidemia 09/22/2006  . Essential hypertension 09/22/2006  . Osteoarthritis 09/22/2006  . Low back pain 09/22/2006  . Adenomatous polyp of colon 02/04/2004   Social History   Tobacco Use  . Smoking status: Former Smoker    Packs/day: 2.00    Years: 41.00    Pack years: 82.00    Types: Cigarettes    Last attempt to quit: 03/06/2012    Years since quitting: 5.3  . Smokeless tobacco: Never Used  Substance Use Topics  . Alcohol use: No    Alcohol/week: 0.0 oz  . Drug use: No   Current Medications and Allergies:   Current Outpatient Medications:  .  albuterol (PROVENTIL HFA;VENTOLIN HFA) 108 (90 Base) MCG/ACT inhaler, Inhale 2 puffs into the lungs every 6 (six) hours as needed for wheezing or shortness of breath., Disp: 1 Inhaler, Rfl: 3 .  amLODipine (NORVASC) 5 MG tablet, Take 1 tablet (5 mg total) by mouth daily., Disp: 90 tablet, Rfl: 3 .  aspirin EC 81 MG tablet, Take 81 mg by mouth daily., Disp: , Rfl:  .  atorvastatin (LIPITOR)  80 MG tablet, Take 1 tablet (80 mg total) by mouth daily., Disp: 90 tablet, Rfl: 2 .  Biotin 10000 MCG TABS, Take 10,000 mcg by mouth daily. Dissolvable tablets, Disp: , Rfl:  .  blood glucose meter kit and supplies KIT, Dispense based on patient and insurance preference. Use to test daily. (FOR ICD-9 250.00, 250.01). 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  carvedilol (COREG) 3.125 MG tablet, Pt to take 3.125 mg with the carvedilol 6.25 mg  for a total of 9.375  Twice a day., Disp: 180 tablet, Rfl: 3 .  carvedilol (COREG) 6.25 MG tablet, Pt to take carvedilol 6.25 mg with the carvedilol 3.125 mg for a  total of 9.375 mg twice da day., Disp: 180 tablet, Rfl: 3 .  clopidogrel (PLAVIX) 75 MG tablet, Take 1 tablet (75 mg total) by mouth daily., Disp: 90 tablet, Rfl: 2 .  denosumab (PROLIA) 60 MG/ML SOSY injection, Inject 60 mg into the skin every 6 (six) months., Disp: , Rfl:  .  furosemide (LASIX) 20 MG tablet, Take one by mouth daily as needed for edema., Disp: 20 tablet, Rfl: 1 .  gabapentin (NEURONTIN) 100 MG capsule, TAKE 1 CAPSULE BY MOUTH THREE TIMES A DAY, Disp: 270 capsule, Rfl: 1 .  glucose blood (FREESTYLE LITE) test strip, TEST UP TO FOUR TIMES DAILY Dx: E11.22, Disp: 360 each, Rfl: 5 .  HYDROcodone-acetaminophen (NORCO) 10-325 MG tablet, Take 1 tablet by mouth every 8 (eight) hours as needed., Disp: 30 tablet, Rfl: 0 .  Lancets (FREESTYLE) lancets, TEST UP TO FOUR TIMES DAILY Dx: E11.22, Disp: 360 each, Rfl: 5 .  nitroGLYCERIN (NITROLINGUAL) 0.4 MG/SPRAY spray, Place 1 spray under the tongue every 5 (five) minutes x 3 doses as needed for chest pain., Disp: 4.9 g, Rfl: 3 .  pantoprazole (PROTONIX) 40 MG tablet, Take 1 tablet (40 mg total) by mouth daily., Disp: 90 tablet, Rfl: 2 .  potassium chloride SA (K-DUR,KLOR-CON) 20 MEQ tablet, Take with Lasix dose., Disp: 20 tablet, Rfl: 1 .  valsartan (DIOVAN) 320 MG tablet, Take 1 tablet (320 mg total) by mouth daily., Disp: 30 tablet, Rfl: 3  Current Facility-Administered Medications:  .  0.9 %  sodium chloride infusion, 500 mL, Intravenous, Once, Ladene Artist, MD   Allergies  Allergen Reactions  . Sulfa Antibiotics Diarrhea and Nausea And Vomiting   Review of Systems   Pertinent items are noted in the HPI. Otherwise, ROS is negative.  Vitals:   Vitals:   07/03/17 0802  BP: 138/84  Pulse: 61  Temp: 98.1 F (36.7 C)  TempSrc: Oral  SpO2: 97%  Weight: 219 lb 9.6 oz (99.6 kg)  Height: '5\' 2"'  (1.575 m)     Body mass index is 40.17 kg/m.  Physical Exam:   Physical Exam  Constitutional: She is oriented to person, place,  and time. She appears well-developed and well-nourished. No distress.  HENT:  Head: Normocephalic and atraumatic.  Right Ear: External ear normal.  Left Ear: External ear normal.  Nose: Nose normal.  Mouth/Throat: Oropharynx is clear and moist.  Eyes: Pupils are equal, round, and reactive to light. Conjunctivae and EOM are normal.  Neck: Normal range of motion. Neck supple. No thyromegaly present.  Cardiovascular: Normal rate, regular rhythm, normal heart sounds and intact distal pulses.  Pulmonary/Chest: Effort normal and breath sounds normal.  Abdominal: Soft. Bowel sounds are normal.  Musculoskeletal: Normal range of motion.  Lymphadenopathy:    She has no cervical adenopathy.  Neurological: She is alert and oriented to person, place, and time.  Skin: Skin is warm and dry. Capillary refill takes less than 2 seconds.  Psychiatric: She has a normal mood and affect. Her behavior is normal.  Nursing note and vitals reviewed.  Diabetic Foot Exam - Simple   Simple Foot Form Diabetic Foot exam was performed with the following findings:  Yes 07/05/2017 11:17 AM  Visual Inspection No deformities, no ulcerations, no other skin breakdown bilaterally:  Yes Sensation Testing Intact to touch and monofilament testing bilaterally:  Yes Pulse Check Posterior Tibialis and Dorsalis pulse intact bilaterally:  Yes Comments     Assessment and Plan:   Diagnoses and all orders for this visit:  Routine physical examination  Hx of gastric bypass Comments: Due for yearly screening labs. Orders: -     Ferritin -     Vitamin B1 -     PTH, intact (no Ca) -     Zinc  Pure hypercholesterolemia -     Lipid panel  Essential hypertension -     CBC with Differential/Platelet -     Comprehensive metabolic panel  Controlled type 2 diabetes mellitus with complication, without long-term current use of insulin (HCC) -     Hemoglobin A1c -     Lipid panel  Postoperative malabsorption -      Ferritin -     Vitamin B1 -     PTH, intact (no Ca) -     Zinc  Hyperparathyroidism, secondary (HCC)  Localized osteoporosis without current pathological fracture -     DG Bone Density; Future  Weight gain Comments: Per patient, due to inactivity - lower extremity pain. Improving. Followed by Orthopedics. Knows how to improve with diet and now motivated to do so.   Aortic atherosclerosis ([place])    . Reviewed expectations re: course of current medical issues. . Discussed self-management of symptoms. . Outlined signs and symptoms indicating need for more acute intervention. . Patient verbalized understanding and all questions were answered. Marland Kitchen Health Maintenance issues including appropriate healthy diet, exercise, and smoking avoidance were discussed with patient. . See orders for this visit as documented in the electronic medical record. . Patient received an After Visit Summary.  CMA served as Education administrator during this visit. History, Physical, and Plan performed by medical provider. The above documentation has been reviewed and is accurate and complete. Briscoe Deutscher, D.O.  Briscoe Deutscher, DO [place] 07/05/2017

## 2017-07-05 ENCOUNTER — Encounter: Payer: Self-pay | Admitting: Family Medicine

## 2017-07-09 ENCOUNTER — Telehealth: Payer: Self-pay

## 2017-07-09 ENCOUNTER — Ambulatory Visit (INDEPENDENT_AMBULATORY_CARE_PROVIDER_SITE_OTHER): Payer: PPO

## 2017-07-09 DIAGNOSIS — M159 Polyosteoarthritis, unspecified: Secondary | ICD-10-CM

## 2017-07-09 DIAGNOSIS — M8949 Other hypertrophic osteoarthropathy, multiple sites: Secondary | ICD-10-CM

## 2017-07-09 DIAGNOSIS — M150 Primary generalized (osteo)arthritis: Secondary | ICD-10-CM | POA: Diagnosis not present

## 2017-07-09 LAB — PARATHYROID HORMONE, INTACT (NO CA): PTH: 139 pg/mL — ABNORMAL HIGH (ref 14–64)

## 2017-07-09 LAB — VITAMIN B1: Vitamin B1 (Thiamine): 11 nmol/L (ref 8–30)

## 2017-07-09 LAB — ZINC: Zinc: 76 ug/dL (ref 60–130)

## 2017-07-09 MED ORDER — DENOSUMAB 60 MG/ML ~~LOC~~ SOSY
60.0000 mg | PREFILLED_SYRINGE | Freq: Once | SUBCUTANEOUS | Status: AC
  Administered 2017-07-09: 60 mg via SUBCUTANEOUS

## 2017-07-09 NOTE — Telephone Encounter (Signed)
Patient in office for Prolia injection. She wanted to make sure you were aware. She also wanted to let you know her insurance has paid for past two injections but it does not show payment by [employer].

## 2017-07-09 NOTE — Progress Notes (Signed)
Per orders of Dr.Wallace, injection of Prolia  given by Francella Solian. Patient tolerated injection well. It was given in right arm.  Message sent per patient request to Roselyn Reef H to let her know that she has received for insurance reasons.

## 2017-07-14 ENCOUNTER — Ambulatory Visit: Payer: PPO | Admitting: *Deleted

## 2017-07-16 ENCOUNTER — Encounter: Payer: Self-pay | Admitting: *Deleted

## 2017-07-16 NOTE — Progress Notes (Signed)
Subjective:   Tricia [name] is a 72 y.o. female who presents for Medicare Annual (Subsequent) preventive examination  Reports health as Last OV 07/03/2017 Diet Controlled DM s/p gastric bypass 2003   Lives alone; single family home;  Self sufficient;   States adamantly she does not want to be here "this is a waste of time" Noted 2017; the patient stated "sister and her spouse died of cancer and neither were identified through health checks".    Diet BMI 39  Chol/hdl 3; trig 115  A1c 5.3  Works on diet;  Stays away from sugar; protein and carbs BMI 39 back in 2017; states she can manage her weight and diet    Exercise Getting out of bed Does not exercise because of her back  Does not want to do surgery at this time  2017 could not walk but a few minutes and then had to rest   Bathroom Had a built in shower 2016;    Former smoker; quit 2014 LDCT open  AAA screening    There are no preventive care reminders to display for this patient. Eye exam due in Nov Mammogram 03/2017  Dexa 03/2016- Scheduled for 07/2017  -2.5  States she is following up on dexa in 2 weeks  Apt in 2 weeks  Currently on Prolia   Colonoscopy 04/2017 due 2022  Had shingrix 02/18/2017 Was instructed that she would need a 2nd dose before 7 months; 08/17/2017) Stated she will take at the pharmacy  Cardiac Risk Factors include: advanced age (>49mn, >>79women);family history of premature cardiovascular disease;obesity (BMI >30kg/m2);hypertension;sedentary lifestyle     Objective:     Vitals: BP 132/70   Pulse (!) 55   Ht 5' 2.5" (1.588 m)   Wt 217 lb (98.4 kg)   SpO2 98%   BMI 39.06 kg/m   Body mass index is 39.06 kg/m.  Advanced Directives 07/17/2017 04/29/2017 10/15/2015 10/01/2015 06/22/2015  Does Patient Have a Medical Advance Directive? No No Yes Yes No  Type of Advance Directive - - Living will Living will -  Does patient want to make changes to medical advance directive? - - No -  Patient declined No - Patient declined -  Copy of [facility] [place] Chart? - - No - copy requested No - copy requested -  Would patient like information on creating a medical advance directive? - - - - No - patient declined information    Tobacco Social History   Tobacco Use  Smoking Status Former Smoker  . Packs/day: 2.00  . Years: 41.00  . Pack years: 82.00  . Types: Cigarettes  . Last attempt to quit: 03/06/2012  . Years since quitting: 5.3  Smokeless Tobacco Never Used     Counseling given: Yes   Clinical Intake:     Past Medical History:  Diagnosis Date  . Adenomatous polyp of colon 02/2004  . Anemia   . Anxiety   . Arthritis   . B12 deficiency   . CAD (coronary artery disease)    a. s/p remote BMS to LAD;  b. 09/2015 Inf STEMI/VF Arrest: LM nl, LAD 85p (staged PCI 2 days later w/ 3.0x32 Synergy DES), 40p/m, 25d, LCX 473mRCA 80ost/100p (2.75x32 Synergy DES), 6531mF 55-65%. // c. Myoview 2/18: EF 59, no ischemia or infarction; Normal study  . Depression   . Dermatophytosis of groin and perianal area   . Diet Controlled Diabetes Mellitus   . Diverticulosis   . H/O  echocardiogram    a. 09/2015 Echo: EF 60-65%, no rwma, mild AI, mildly dil RA, mild to mod TR.  Marland Kitchen Hyperlipidemia   . Hypertensive heart disease   . Low back pain    l5 disc  . Morbid obesity ([place])   . Osteoporosis   . PVC's (premature ventricular contractions) 04/16/2017   Holter 3/19: NSR, average heart rate 69, frequent PVCs (5% total beats), rare supraventricular ectopics, no AF/flutter  . Sleep apnea 10/03/2009   Resolved after gastric bypass    . TOBACCO ABUSE 10/05/2009   Qualifier: Diagnosis of  By: Stanford Breed, MD, Kandyce Rud   . Ventricular fibrillation ([place]) 10/01/2015   a. In setting of inferior STEMI.   Past Surgical History:  Procedure Laterality Date  . ABDOMINAL HYSTERECTOMY    . APPENDECTOMY    . BARIATRIC SURGERY    . CARDIAC CATHETERIZATION N/A 10/01/2015    Procedure: Left Heart Cath and Coronary Angiography;  Surgeon: Leonie Man, MD;  Location: [facility];  Service: Cardiovascular;  Laterality: N/A;  . CARDIAC CATHETERIZATION N/A 10/01/2015   Procedure: Coronary Stent Intervention;  Surgeon: Leonie Man, MD;  Location: [facility];  Service: Cardiovascular;  Laterality: N/A;  . CARDIAC CATHETERIZATION N/A 10/03/2015   Procedure: Coronary Stent Intervention;  Surgeon: Troy Sine, MD;  Location: [facility];  Service: Cardiovascular;  Laterality: N/A;  . CARDIAC CATHETERIZATION N/A 10/03/2015   Procedure: Coronary/Graft Angiography;  Surgeon: Troy Sine, MD;  Location: [facility];  Service: Cardiovascular;  Laterality: N/A;  . CARPAL TUNNEL RELEASE Bilateral   . CERVICAL DISC SURGERY    . CHOLECYSTECTOMY    . CORONARY ANGIOPLASTY  2002  . HERNIA REPAIR    . LUMBAR LAMINECTOMY     L3-4  . NEPHRECTOMY     Partial  . TONSILLECTOMY    . TOTAL KNEE ARTHROPLASTY Left   . TUBAL LIGATION    . ULNAR NERVE REPAIR     Family History  Problem Relation Age of Onset  . Colitis Mother        sepsis from c dif colitis  . Heart disease Father   . Coronary artery disease Unknown   . Stomach cancer Maternal Uncle    Social History   Socioeconomic History  . Marital status: Married    Spouse name: Not on file  . Number of children: Not on file  . Years of education: Not on file  . Highest education level: Not on file  Occupational History  . Occupation: retired    Fish farm manager: UNEMPLOYED  Social Needs  . Financial resource strain: Not on file  . Food insecurity:    Worry: Not on file    Inability: Not on file  . Transportation needs:    Medical: Not on file    Non-medical: Not on file  Tobacco Use  . Smoking status: Former Smoker    Packs/day: 2.00    Years: 41.00    Pack years: 82.00    Types: Cigarettes    Last attempt to quit: 03/06/2012    Years since quitting: 5.3  . Smokeless tobacco: Never  Used  Substance and Sexual Activity  . Alcohol use: No    Alcohol/week: 0.0 oz  . Drug use: No  . Sexual activity: Yes  Lifestyle  . Physical activity:    Days per week: Not on file    Minutes per session: Not on file  . Stress: Not on  file  Relationships  . Social connections:    Talks on phone: Not on file    Gets together: Not on file    Attends religious service: Not on file    Active member of club or organization: Not on file    Attends meetings of clubs or organizations: Not on file    Relationship status: Not on file  Other Topics Concern  . Not on file  Social History Narrative   Widowed in 04/2012. 2 children. 3 grandkids. Lives alone. Completely indendent. Disabled/retired. Disabled-lifted computer paper. Hobbies: spend money-gamble.    Outpatient Encounter Medications as of 07/17/2017  Medication Sig  . albuterol (PROVENTIL HFA;VENTOLIN HFA) 108 (90 Base) MCG/ACT inhaler Inhale 2 puffs into the lungs every 6 (six) hours as needed for wheezing or shortness of breath.  Marland Kitchen amLODipine (NORVASC) 5 MG tablet Take 1 tablet (5 mg total) by mouth daily.  Marland Kitchen aspirin EC 81 MG tablet Take 81 mg by mouth daily.  Marland Kitchen atorvastatin (LIPITOR) 80 MG tablet Take 1 tablet (80 mg total) by mouth daily.  . Biotin 10000 MCG TABS Take 10,000 mcg by mouth daily. Dissolvable tablets  . blood glucose meter kit and supplies KIT Dispense based on patient and insurance preference. Use to test daily. (FOR ICD-9 250.00, 250.01). Dx Code:E11.22 N18.1  . carvedilol (COREG) 3.125 MG tablet Pt to take 3.125 mg with the carvedilol 6.25 mg  for a total of 9.375  Twice a day.  . carvedilol (COREG) 6.25 MG tablet Pt to take carvedilol 6.25 mg with the carvedilol 3.125 mg for a total of 9.375 mg twice da day.  . clopidogrel (PLAVIX) 75 MG tablet Take 1 tablet (75 mg total) by mouth daily.  Marland Kitchen denosumab (PROLIA) 60 MG/ML SOSY injection Inject 60 mg into the skin every 6 (six) months.  . furosemide (LASIX) 20 MG  tablet Take one by mouth daily as needed for edema.  . gabapentin (NEURONTIN) 100 MG capsule TAKE 1 CAPSULE BY MOUTH THREE TIMES A DAY  . glucose blood (FREESTYLE LITE) test strip TEST UP TO FOUR TIMES DAILY Dx: E11.22  . HYDROcodone-acetaminophen (NORCO) 10-325 MG tablet Take 1 tablet by mouth every 8 (eight) hours as needed.  . Lancets (FREESTYLE) lancets TEST UP TO FOUR TIMES DAILY Dx: E11.22  . nitroGLYCERIN (NITROLINGUAL) 0.4 MG/SPRAY spray Place 1 spray under the tongue every 5 (five) minutes x 3 doses as needed for chest pain.  . pantoprazole (PROTONIX) 40 MG tablet Take 1 tablet (40 mg total) by mouth daily.  . potassium chloride SA (K-DUR,KLOR-CON) 20 MEQ tablet Take with Lasix dose.  . valsartan (DIOVAN) 320 MG tablet Take 1 tablet (320 mg total) by mouth daily.   Facility-Administered Encounter Medications as of 07/17/2017  Medication  . 0.9 %  sodium chloride infusion    Activities of Daily Living In your present state of health, do you have any difficulty performing the following activities: 07/17/2017  Hearing? N  Vision? N  Difficulty concentrating or making decisions? N  Walking or climbing stairs? N  Dressing or bathing? N  Doing errands, shopping? N  Preparing Food and eating ? N  Using the Toilet? N  In the past six months, have you accidently leaked urine? N  Do you have problems with loss of bowel control? N  Managing your Medications? N  Managing your Finances? N  Housekeeping or managing your Housekeeping? N  Some recent data might be hidden    Patient Care Team: Briscoe Deutscher,  DO as PCP - General (Family Medicine) Sherren Mocha, MD as PCP - Cardiology (Cardiology)    Assessment:   This is a routine wellness examination for [place].  Exercise Activities and Dietary recommendations Current Exercise Habits: Home exercise routine, Intensity: Mild  Goals    . Weight < 180 lb (81.647 kg)     Is currently trying to lose weight What is the "Mediterranean"  diet?  There's no one "Mediterranean" diet. At least 16 countries border the [employer]. Diets vary between these countries and also between regions within a country. Many differences in culture, ethnic background, religion, economy and agricultural production result in different diets. But the common Mediterranean dietary pattern has these characteristics: high consumption of fruits, vegetables, bread and other cereals, potatoes, beans, nuts and seeds  olive oil is an important monounsaturated fat source  dairy products, fish and poultry are consumed in low to moderate amounts, and little red meat is eaten  eggs are consumed zero to four times a week  wine is consumed in low to moderate amounts Does a Mediterranean-style diet follow American Heart Association dietary recommendations? Mediterranean-style diets are often close to our dietary recommendations, but they don't follow them exactly. In general, the diets of Mediterranean peoples contain a relatively high percentage of calories from fat. This is thought to contribute to the increasing obesity in these countries, which is becoming a concern. People who follow the average Mediterranean diet eat less saturated fat than those who eat the average American diet. In fact, saturated fat consumption is well within our dietary guidelines. More than half the fat calories in a Mediterranean diet come from monounsaturated fats (mainly from olive oil). Monounsaturated fat doesn't raise blood cholesterol levels the way saturated fat does. The incidence of heart disease in [place] countries is lower than in the [place]. Death rates are lower, too. But this may not be entirely due to the diet. Lifestyle factors (such as more physical activity and extended social support systems) may also play a part. Before advising people to follow a Mediterranean diet, we need more studies to find out whether the diet itself or other lifestyle factors  account for the lower deaths from heart disease.  American Heart Association        Fall Risk Fall Risk  07/03/2017 04/10/2017 04/10/2017 04/10/2017 02/18/2017  Falls in the past year? No Yes Yes Yes No  Number falls in past yr: - _0 -  Injury with Fall? - No No No -  Risk for fall due to : - Other (Comment) Other (Comment) - -  Follow up - Education provided - - -     Depression Screen PHQ 2/9 Scores 07/03/2017 03/10/2016 12/03/2015 11/29/2015  PHQ - 2 Score 0 0 0 0  PHQ- 9 Score - - - -     Cognitive Function MMSE - Mini Mental State Exam 07/17/2017 06/22/2015  Not completed: (No Data) (No Data)    states she is fine. Declines further testing    Immunization History  Administered Date(s) Administered  . Influenza Split 01/21/2011  . Influenza Whole 11/19/2007  . Influenza, High Dose Seasonal PF 10/16/2015, 11/12/2016  . Influenza-Unspecified 12/18/2013  . Pneumococcal Conjugate-13 08/26/2013  . Pneumococcal Polysaccharide-23 02/04/2003, 06/08/2015  . Td 02/03/2005  . Zoster Recombinat (Shingrix) 02/18/2017     Screening Tests Health Maintenance  Topic Date Due  . TETANUS/TDAP  01/30/2098 (Originally 02/04/2015)  . INFLUENZA VACCINE  09/03/2017  . OPHTHALMOLOGY EXAM  12/04/2017  .  HEMOGLOBIN A1C  01/02/2018  . FOOT EXAM  07/04/2018  . MAMMOGRAM  03/28/2019  . COLONOSCOPY  04/29/2020  . Hepatitis C Screening  Completed  . PNA vac Low Risk Adult  Completed  . DEXA SCAN  Addressed         Plan:      PCP Notes   Health Maintenance States she had a mammogram this year  Had one dose of shingrix 02/18/2017 and was educated that she would need another. She did not know this but will take care of this prior to 08/17/2017    Abnormal Screens  None, but states she is watching her diet   Referrals  none  Patient concerns; Her back pain - does not want surgery  Nurse Concerns; As noted  Next PCP apt Was just seen 07/03/2017  Does not wish to come back  for another AWV;  States it is a waste of her time, although she did not know she needed another shingrix vaccine and admitted that was helpful.       I have personally reviewed and noted the following in the patient's chart:   . Medical and social history . Use of alcohol, tobacco or illicit drugs  . Current medications and supplements . Functional ability and status . Nutritional status . Physical activity . Advanced directives . List of other physicians . Hospitalizations, surgeries, and ER visits in previous 12 months . Vitals . Screenings to include cognitive, depression, and falls . Referrals and appointments  In addition, I have reviewed and discussed with patient certain preventive protocols, quality metrics, and best practice recommendations. A written personalized care plan for preventive services as well as general preventive health recommendations were provided to patient.     Wynetta Fines, RN  07/17/2017

## 2017-07-17 ENCOUNTER — Encounter: Payer: Self-pay | Admitting: *Deleted

## 2017-07-17 ENCOUNTER — Ambulatory Visit (INDEPENDENT_AMBULATORY_CARE_PROVIDER_SITE_OTHER): Payer: PPO | Admitting: *Deleted

## 2017-07-17 VITALS — BP 132/70 | HR 55 | Ht 62.5 in | Wt 217.0 lb

## 2017-07-17 DIAGNOSIS — Z0000 Encounter for general adult medical examination without abnormal findings: Secondary | ICD-10-CM | POA: Diagnosis not present

## 2017-07-17 NOTE — Progress Notes (Signed)
I have personally reviewed the Medicare Annual Wellness questionnaire and have noted 1. The patient's medical and social history 2. Their use of alcohol, tobacco or illicit drugs 3. Their current medications and supplements 4. The patient's functional ability including ADL's, fall risks, home safety risks and hearing or visual impairment. 5. Diet and physical activities 6. Evidence for depression or mood disorders 7. Reviewed Updated provider list, see scanned forms and [facility] Snapshot.   The patients weight, height, BMI and visual acuity have been recorded in the chart I have made referrals, counseling and provided education to the patient based review of the above and I have provided the pt with a written personalized care plan for preventive services.  I have provided the patient with a copy of your personalized plan for preventive services. Instructed to take the time to review along with their updated medication list.  Josaiah Muhammed DO 

## 2017-07-17 NOTE — Patient Instructions (Addendum)
Tricia [name] , Thank you for taking time to come for your Medicare Wellness Visit. I appreciate your ongoing commitment to your health goals. Please review the following plan we discussed and let me know if I can assist you in the future.   Shingrix is a vaccine for the prevention of Shingles in Adults 50 and older.  If you are on Medicare, the shingrix is covered under your Part D plan, so you will take both of the vaccines in the series at your pharmacy. Please check with your benefits regarding applicable copays or out of pocket expenses.  The Shingrix is given in 2 vaccines approx 8 weeks apart. You must receive the 2nd dose prior to 6 months from receipt of the first. Please have the pharmacist print out you Immunization  dates for our office records  (you have to take your last does before 08/17/2017)    These are the goals we discussed: Goals    . Weight < 180 lb (81.647 kg)     Is currently trying to lose weight What is the "Mediterranean" diet?  There's no one "Mediterranean" diet. At least 16 countries border the [employer]. Diets vary between these countries and also between regions within a country. Many differences in culture, ethnic background, religion, economy and agricultural production result in different diets. But the common Mediterranean dietary pattern has these characteristics: high consumption of fruits, vegetables, bread and other cereals, potatoes, beans, nuts and seeds  olive oil is an important monounsaturated fat source  dairy products, fish and poultry are consumed in low to moderate amounts, and little red meat is eaten  eggs are consumed zero to four times a week  wine is consumed in low to moderate amounts Does a Mediterranean-style diet follow American Heart Association dietary recommendations? Mediterranean-style diets are often close to our dietary recommendations, but they don't follow them exactly. In general, the diets of Mediterranean peoples contain  a relatively high percentage of calories from fat. This is thought to contribute to the increasing obesity in these countries, which is becoming a concern. People who follow the average Mediterranean diet eat less saturated fat than those who eat the average American diet. In fact, saturated fat consumption is well within our dietary guidelines. More than half the fat calories in a Mediterranean diet come from monounsaturated fats (mainly from olive oil). Monounsaturated fat doesn't raise blood cholesterol levels the way saturated fat does. The incidence of heart disease in [place] countries is lower than in the [place]. Death rates are lower, too. But this may not be entirely due to the diet. Lifestyle factors (such as more physical activity and extended social support systems) may also play a part. Before advising people to follow a Mediterranean diet, we need more studies to find out whether the diet itself or other lifestyle factors account for the lower deaths from heart disease.  American Heart Association        This is a list of the screening recommended for you and due dates:  Health Maintenance  Topic Date Due  . Eye exam for diabetics  07/04/2016  . Tetanus Vaccine  01/30/2098*  . Flu Shot  09/03/2017  . Hemoglobin A1C  01/02/2018  . Complete foot exam   07/04/2018  . Mammogram  03/28/2019  . Colon Cancer Screening  04/29/2020  .  Hepatitis C: One time screening is recommended by Center for Disease Control  (CDC) for  adults born from [birth year] through 1965.   Completed  .  Pneumonia vaccines  Completed  . DEXA scan (bone density measurement)  Addressed  *Topic was postponed. The date shown is not the original due date.      Fall Prevention in the Home Falls can cause injuries. They can happen to people of all ages. There are many things you can do to make your home safe and to help prevent falls. What can I do on the outside of my home?  Regularly fix the edges of  walkways and driveways and fix any cracks.  Remove anything that might make you trip as you walk through a door, such as a raised step or threshold.  Trim any bushes or trees on the path to your home.  Use bright outdoor lighting.  Clear any walking paths of anything that might make someone trip, such as rocks or tools.  Regularly check to see if handrails are loose or broken. Make sure that both sides of any steps have handrails.  Any raised decks and porches should have guardrails on the edges.  Have any leaves, snow, or ice cleared regularly.  Use sand or salt on walking paths during winter.  Clean up any spills in your garage right away. This includes oil or grease spills. What can I do in the bathroom?  Use night lights.  Install grab bars by the toilet and in the tub and shower. Do not use towel bars as grab bars.  Use non-skid mats or decals in the tub or shower.  If you need to sit down in the shower, use a plastic, non-slip stool.  Keep the floor dry. Clean up any water that spills on the floor as soon as it happens.  Remove soap buildup in the tub or shower regularly.  Attach bath mats securely with double-sided non-slip rug tape.  Do not have throw rugs and other things on the floor that can make you trip. What can I do in the bedroom?  Use night lights.  Make sure that you have a light by your bed that is easy to reach.  Do not use any sheets or blankets that are too big for your bed. They should not hang down onto the floor.  Have a firm chair that has side arms. You can use this for support while you get dressed.  Do not have throw rugs and other things on the floor that can make you trip. What can I do in the kitchen?  Clean up any spills right away.  Avoid walking on wet floors.  Keep items that you use a lot in easy-to-reach places.  If you need to reach something above you, use a strong step stool that has a grab bar.  Keep electrical cords  out of the way.  Do not use floor polish or wax that makes floors slippery. If you must use wax, use non-skid floor wax.  Do not have throw rugs and other things on the floor that can make you trip. What can I do with my stairs?  Do not leave any items on the stairs.  Make sure that there are handrails on both sides of the stairs and use them. Fix handrails that are broken or loose. Make sure that handrails are as long as the stairways.  Check any carpeting to make sure that it is firmly attached to the stairs. Fix any carpet that is loose or worn.  Avoid having throw rugs at the top or bottom of the stairs. If you do have throw rugs,  attach them to the floor with carpet tape.  Make sure that you have a light switch at the top of the stairs and the bottom of the stairs. If you do not have them, ask someone to add them for you. What else can I do to help prevent falls?  Wear shoes that: ? Do not have high heels. ? Have rubber bottoms. ? Are comfortable and fit you well. ? Are closed at the toe. Do not wear sandals.  If you use a stepladder: ? Make sure that it is fully opened. Do not climb a closed stepladder. ? Make sure that both sides of the stepladder are locked into place. ? Ask someone to hold it for you, if possible.  Clearly mark and make sure that you can see: ? Any grab bars or handrails. ? First and last steps. ? Where the edge of each step is.  Use tools that help you move around (mobility aids) if they are needed. These include: ? Canes. ? Walkers. ? Scooters. ? Crutches.  Turn on the lights when you go into a dark area. Replace any light bulbs as soon as they burn out.  Set up your furniture so you have a clear path. Avoid moving your furniture around.  If any of your floors are uneven, fix them.  If there are any pets around you, be aware of where they are.  Review your medicines with your doctor. Some medicines can make you feel dizzy. This can increase  your chance of falling. Ask your doctor what other things that you can do to help prevent falls. This information is not intended to replace advice given to you by your health care provider. Make sure you discuss any questions you have with your health care provider. Document Released: 11/16/2008 Document Revised: 06/28/2015 Document Reviewed: 02/24/2014 Elsevier Interactive Patient Education  2018 [place] Maintenance, Female Adopting a healthy lifestyle and getting preventive care can go a long way to promote health and wellness. Talk with your health care provider about what schedule of regular examinations is right for you. This is a good chance for you to check in with your provider about disease prevention and staying healthy. In between checkups, there are plenty of things you can do on your own. Experts have done a lot of research about which lifestyle changes and preventive measures are most likely to keep you healthy. Ask your health care provider for more information. Weight and diet Eat a healthy diet  Be sure to include plenty of vegetables, fruits, low-fat dairy products, and lean protein.  Do not eat a lot of foods high in solid fats, added sugars, or salt.  Get regular exercise. This is one of the most important things you can do for your health. ? Most adults should exercise for at least 150 minutes each week. The exercise should increase your heart rate and make you sweat (moderate-intensity exercise). ? Most adults should also do strengthening exercises at least twice a week. This is in addition to the moderate-intensity exercise.  Maintain a healthy weight  Body mass index (BMI) is a measurement that can be used to identify possible weight problems. It estimates body fat based on height and weight. Your health care provider can help determine your BMI and help you achieve or maintain a healthy weight.  For females 59 years of age and older: ? A BMI below  18.5 is considered underweight. ? A BMI of 18.5 to 24.9 is normal. ?  A BMI of 25 to 29.9 is considered overweight. ? A BMI of 30 and above is considered obese.  Watch levels of cholesterol and blood lipids  You should start having your blood tested for lipids and cholesterol at 70 years of age, then have this test every 5 years.  You may need to have your cholesterol levels checked more often if: ? Your lipid or cholesterol levels are high. ? You are older than 70 years of age. ? You are at high risk for heart disease.  Cancer screening Lung Cancer  Lung cancer screening is recommended for adults 56-51 years old who are at high risk for lung cancer because of a history of smoking.  A yearly low-dose CT scan of the lungs is recommended for people who: ? Currently smoke. ? Have quit within the past 15 years. ? Have at least a 30-pack-year history of smoking. A pack year is smoking an average of one pack of cigarettes a day for 1 year.  Yearly screening should continue until it has been 15 years since you quit.  Yearly screening should stop if you develop a health problem that would prevent you from having lung cancer treatment.  Breast Cancer  Practice breast self-awareness. This means understanding how your breasts normally appear and feel.  It also means doing regular breast self-exams. Let your health care provider know about any changes, no matter how small.  If you are in your 20s or 30s, you should have a clinical breast exam (CBE) by a health care provider every 1-3 years as part of a regular health exam.  If you are 17 or older, have a CBE every year. Also consider having a breast X-ray (mammogram) every year.  If you have a family history of breast cancer, talk to your health care provider about genetic screening.  If you are at high risk for breast cancer, talk to your health care provider about having an MRI and a mammogram every year.  Breast cancer gene (BRCA)  assessment is recommended for women who have family members with BRCA-related cancers. BRCA-related cancers include: ? Breast. ? Ovarian. ? Tubal. ? Peritoneal cancers.  Results of the assessment will determine the need for genetic counseling and BRCA1 and BRCA2 testing.  Cervical Cancer Your health care provider may recommend that you be screened regularly for cancer of the pelvic organs (ovaries, uterus, and vagina). This screening involves a pelvic examination, including checking for microscopic changes to the surface of your cervix (Pap test). You may be encouraged to have this screening done every 3 years, beginning at age 47.  For women ages 21-65, health care providers may recommend pelvic exams and Pap testing every 3 years, or they may recommend the Pap and pelvic exam, combined with testing for human papilloma virus (HPV), every 5 years. Some types of HPV increase your risk of cervical cancer. Testing for HPV may also be done on women of any age with unclear Pap test results.  Other health care providers may not recommend any screening for nonpregnant women who are considered low risk for pelvic cancer and who do not have symptoms. Ask your health care provider if a screening pelvic exam is right for you.  If you have had past treatment for cervical cancer or a condition that could lead to cancer, you need Pap tests and screening for cancer for at least 20 years after your treatment. If Pap tests have been discontinued, your risk factors (such as having a new  sexual partner) need to be reassessed to determine if screening should resume. Some women have medical problems that increase the chance of getting cervical cancer. In these cases, your health care provider may recommend more frequent screening and Pap tests.  Colorectal Cancer  This type of cancer can be detected and often prevented.  Routine colorectal cancer screening usually begins at 70 years of age and continues through 70  years of age.  Your health care provider may recommend screening at an earlier age if you have risk factors for colon cancer.  Your health care provider may also recommend using home test kits to check for hidden blood in the stool.  A small camera at the end of a tube can be used to examine your colon directly (sigmoidoscopy or colonoscopy). This is done to check for the earliest forms of colorectal cancer.  Routine screening usually begins at age 68.  Direct examination of the colon should be repeated every 5-10 years through 70 years of age. However, you may need to be screened more often if early forms of precancerous polyps or small growths are found.  Skin Cancer  Check your skin from head to toe regularly.  Tell your health care provider about any new moles or changes in moles, especially if there is a change in a mole's shape or color.  Also tell your health care provider if you have a mole that is larger than the size of a pencil eraser.  Always use sunscreen. Apply sunscreen liberally and repeatedly throughout the day.  Protect yourself by wearing long sleeves, pants, a wide-brimmed hat, and sunglasses whenever you are outside.  Heart disease, diabetes, and high blood pressure  High blood pressure causes heart disease and increases the risk of stroke. High blood pressure is more likely to develop in: ? People who have blood pressure in the high end of the normal range (130-139/85-89 mm Hg). ? People who are overweight or obese. ? People who are African American.  If you are 69-20 years of age, have your blood pressure checked every 3-5 years. If you are 20 years of age or older, have your blood pressure checked every year. You should have your blood pressure measured twice-once when you are at a hospital or clinic, and once when you are not at a hospital or clinic. Record the average of the two measurements. To check your blood pressure when you are not at a hospital or  clinic, you can use: ? An automated blood pressure machine at a pharmacy. ? A home blood pressure monitor.  If you are between 25 years and 47 years old, ask your health care provider if you should take aspirin to prevent strokes.  Have regular diabetes screenings. This involves taking a blood sample to check your fasting blood sugar level. ? If you are at a normal weight and have a low risk for diabetes, have this test once every three years after 70 years of age. ? If you are overweight and have a high risk for diabetes, consider being tested at a younger age or more often. Preventing infection Hepatitis B  If you have a higher risk for hepatitis B, you should be screened for this virus. You are considered at high risk for hepatitis B if: ? You were born in a country where hepatitis B is common. Ask your health care provider which countries are considered high risk. ? Your parents were born in a high-risk country, and you have not been immunized  against hepatitis B (hepatitis B vaccine). ? You have HIV or AIDS. ? You use needles to inject street drugs. ? You live with someone who has hepatitis B. ? You have had sex with someone who has hepatitis B. ? You get hemodialysis treatment. ? You take certain medicines for conditions, including cancer, organ transplantation, and autoimmune conditions.  Hepatitis C  Blood testing is recommended for: ? Everyone born from [birth year] through 1963. ? Anyone with known risk factors for hepatitis C.  Sexually transmitted infections (STIs)  You should be screened for sexually transmitted infections (STIs) including gonorrhea and chlamydia if: ? You are sexually active and are younger than 70 years of age. ? You are older than 70 years of age and your health care provider tells you that you are at risk for this type of infection. ? Your sexual activity has changed since you were last screened and you are at an increased risk for chlamydia or gonorrhea.  Ask your health care provider if you are at risk.  If you do not have HIV, but are at risk, it may be recommended that you take a prescription medicine daily to prevent HIV infection. This is called pre-exposure prophylaxis (PrEP). You are considered at risk if: ? You are sexually active and do not regularly use condoms or know the HIV status of your partner(s). ? You take drugs by injection. ? You are sexually active with a partner who has HIV.  Talk with your health care provider about whether you are at high risk of being infected with HIV. If you choose to begin PrEP, you should first be tested for HIV. You should then be tested every 3 months for as long as you are taking PrEP. Pregnancy  If you are premenopausal and you may become pregnant, ask your health care provider about preconception counseling.  If you may become pregnant, take 400 to 800 micrograms (mcg) of folic acid every day.  If you want to prevent pregnancy, talk to your health care provider about birth control (contraception). Osteoporosis and menopause  Osteoporosis is a disease in which the bones lose minerals and strength with aging. This can result in serious bone fractures. Your risk for osteoporosis can be identified using a bone density scan.  If you are 83 years of age or older, or if you are at risk for osteoporosis and fractures, ask your health care provider if you should be screened.  Ask your health care provider whether you should take a calcium or vitamin D supplement to lower your risk for osteoporosis.  Menopause may have certain physical symptoms and risks.  Hormone replacement therapy may reduce some of these symptoms and risks. Talk to your health care provider about whether hormone replacement therapy is right for you. Follow these instructions at home:  Schedule regular health, dental, and eye exams.  Stay current with your immunizations.  Do not use any tobacco products including cigarettes,  chewing tobacco, or electronic cigarettes.  If you are pregnant, do not drink alcohol.  If you are breastfeeding, limit how much and how often you drink alcohol.  Limit alcohol intake to no more than 1 drink per day for nonpregnant women. One drink equals 12 ounces of beer, 5 ounces of wine, or 1 ounces of hard liquor.  Do not use street drugs.  Do not share needles.  Ask your health care provider for help if you need support or information about quitting drugs.  Tell your health care provider if  you often feel depressed.  Tell your health care provider if you have ever been abused or do not feel safe at home. This information is not intended to replace advice given to you by your health care provider. Make sure you discuss any questions you have with your health care provider. Document Released: 08/05/2010 Document Revised: 06/28/2015 Document Reviewed: 10/24/2014 Elsevier Interactive Patient Education  [employer].

## 2017-07-19 ENCOUNTER — Other Ambulatory Visit: Payer: Self-pay | Admitting: Family Medicine

## 2017-07-24 ENCOUNTER — Other Ambulatory Visit: Payer: Self-pay | Admitting: Family Medicine

## 2017-07-29 ENCOUNTER — Encounter: Payer: Self-pay | Admitting: Family Medicine

## 2017-07-29 ENCOUNTER — Ambulatory Visit
Admission: RE | Admit: 2017-07-29 | Discharge: 2017-07-29 | Disposition: A | Discharge status: Home / Self Care | Payer: PPO | Source: Ambulatory Visit | Attending: Family Medicine | Admitting: Family Medicine

## 2017-07-29 DIAGNOSIS — Z780 Asymptomatic menopausal state: Secondary | ICD-10-CM | POA: Diagnosis not present

## 2017-07-29 DIAGNOSIS — M8589 Other specified disorders of bone density and structure, multiple sites: Secondary | ICD-10-CM | POA: Diagnosis not present

## 2017-07-29 DIAGNOSIS — M816 Localized osteoporosis [Lequesne]: Secondary | ICD-10-CM

## 2017-08-02 NOTE — Progress Notes (Signed)
Tricia [name] is a 72 y.o. female here for an acute visit.  History of Present Illness:   Tricia [name], CMA acting as scribe for Dr. Briscoe Deutscher.   HPI: Patient in for evaluation of on going back pain. She states she has had increased muscle spasms when she has to sit long would like to talk about getting a muscle relaxant added. Right sciatica improved with Neurontin. No falls or trauma.  She would also like bariatric labs.  Cough, mostly at night, feels like something is caught in her throat. Hx of GERD, on Protonix and takes daily. Doesn't feel dyspepsia. Hx of OSA, not on CPAP since bariatric surgery many years ago. Does feel tired, but BB increased over the past few months. Nonsmoker.   EXAM: LUMBAR SPINE - 2-3 VIEW  COMPARISON:  February 10, 2013  FINDINGS: Frontal, lateral, and spot lumbosacral lateral images were obtained. There are 5 non-rib-bearing lumbar type vertebral bodies. There is lower lumbar levoscoliosis. There is no fracture. There is 7 mm of anterolisthesis of L4 on L5, stable from prior study. No new spondylolisthesis. There is slight disc space narrowing at L4-5. Other disc spaces appear normal. No erosive change. There is aortoiliac atherosclerosis.  IMPRESSION: Stable spondylolisthesis at L4-5. No fracture. Lower lumbar scoliosis, stable. Mild disc space narrowing L4-5.  PMHx, SurgHx, SocialHx, Medications, and Allergies were reviewed in the Visit Navigator and updated as appropriate.  Current Medications:   Current Outpatient Medications:  .  albuterol (PROVENTIL HFA;VENTOLIN HFA) 108 (90 Base) MCG/ACT inhaler, Inhale 2 puffs into the lungs every 6 (six) hours as needed for wheezing or shortness of breath., Disp: 1 Inhaler, Rfl: 3 .  amLODipine (NORVASC) 5 MG tablet, Take 1 tablet (5 mg total) by mouth daily., Disp: 90 tablet, Rfl: 3 .  aspirin EC 81 MG tablet, Take 81 mg by mouth daily., Disp: , Rfl:  .  atorvastatin (LIPITOR) 80 MG tablet,  Take 1 tablet (80 mg total) by mouth daily., Disp: 90 tablet, Rfl: 2 .  Biotin 10000 MCG TABS, Take 10,000 mcg by mouth daily. Dissolvable tablets, Disp: , Rfl:  .  blood glucose meter kit and supplies KIT, Dispense based on patient and insurance preference. Use to test daily. (FOR ICD-9 250.00, 250.01). 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  carvedilol (COREG) 3.125 MG tablet, Pt to take 3.125 mg with the carvedilol 6.25 mg  for a total of 9.375  Twice a day., Disp: 180 tablet, Rfl: 3 .  carvedilol (COREG) 6.25 MG tablet, Pt to take carvedilol 6.25 mg with the carvedilol 3.125 mg for a total of 9.375 mg twice da day., Disp: 180 tablet, Rfl: 3 .  clopidogrel (PLAVIX) 75 MG tablet, Take 1 tablet (75 mg total) by mouth daily., Disp: 90 tablet, Rfl: 2 .  denosumab (PROLIA) 60 MG/ML SOSY injection, Inject 60 mg into the skin every 6 (six) months., Disp: , Rfl:  .  gabapentin (NEURONTIN) 100 MG capsule, TAKE 1 CAPSULE BY MOUTH THREE TIMES A DAY, Disp: 270 capsule, Rfl: 1 .  glucose blood (FREESTYLE LITE) test strip, TEST UP TO FOUR TIMES DAILY Dx: E11.22, Disp: 360 each, Rfl: 5 .  HYDROcodone-acetaminophen (NORCO) 10-325 MG tablet, Take 1 tablet by mouth every 8 (eight) hours as needed., Disp: 30 tablet, Rfl: 0 .  Lancets (FREESTYLE) lancets, TEST UP TO FOUR TIMES DAILY Dx: E11.22, Disp: 360 each, Rfl: 5 .  nitroGLYCERIN (NITROLINGUAL) 0.4 MG/SPRAY spray, Place 1 spray under the tongue  every 5 (five) minutes x 3 doses as needed for chest pain., Disp: 4.9 g, Rfl: 3 .  pantoprazole (PROTONIX) 40 MG tablet, Take 1 tablet (40 mg total) by mouth daily., Disp: 90 tablet, Rfl: 2 .  valsartan (DIOVAN) 320 MG tablet, Take 1 tablet (320 mg total) by mouth daily., Disp: 30 tablet, Rfl: 3  Allergies  Allergen Reactions  . Sulfa Antibiotics Diarrhea and Nausea And Vomiting   Review of Systems:   Pertinent items are noted in the HPI. Otherwise, ROS is negative.  Vitals:   Vitals:   08/03/17 1307  BP:  126/62  Pulse: 66  Temp: 98.6 F (37 C)  TempSrc: Oral  SpO2: 96%  Weight: 217 lb 3.2 oz (98.5 kg)  Height: '5\' 2"'  (1.575 m)     Body mass index is 39.73 kg/m.  Physical Exam:   Physical Exam  Constitutional: She appears well-nourished.  HENT:  Head: Normocephalic and atraumatic.  Eyes: Pupils are equal, round, and reactive to light. EOM are normal.  Neck: Normal range of motion. Neck supple.  Cardiovascular: Normal rate, regular rhythm, normal heart sounds and intact distal pulses.  Pulmonary/Chest: Effort normal.  Abdominal: Soft.  Musculoskeletal:       Lumbar back: She exhibits decreased range of motion and spasm. She exhibits no bony tenderness.       Back:  Skin: Skin is warm.  Psychiatric: She has a normal mood and affect. Her behavior is normal.  Nursing note and vitals reviewed.  Assessment and Plan:   Tricia [name] was seen today for back pain.  Diagnoses and all orders for this visit:  Chronic low back pain with right-sided sciatica, unspecified back pain laterality -     baclofen (LIORESAL) 10 MG tablet; Take 1 tablet (10 mg total) by mouth 2 (two) times daily.  Hoarseness of voice -     Ambulatory referral to ENT  Postoperative malabsorption -     VITAMIN D 25 Hydroxy (Vit-D Deficiency, Fractures) -     Iron, TIBC and Ferritin Panel -     B12   . Reviewed expectations re: course of current medical issues. . Discussed self-management of symptoms. . Outlined signs and symptoms indicating need for more acute intervention. . Patient verbalized understanding and all questions were answered. Marland Kitchen Health Maintenance issues including appropriate healthy diet, exercise, and smoking avoidance were discussed with patient. . See orders for this visit as documented in the electronic medical record. . Patient received an After Visit Summary.  CMA served as Education administrator during this visit. History, Physical, and Plan performed by medical provider. The above documentation has been  reviewed and is accurate and complete. Briscoe Deutscher, D.O.  Briscoe Deutscher, DO [place], [facility] 08/03/2017

## 2017-08-03 ENCOUNTER — Ambulatory Visit (INDEPENDENT_AMBULATORY_CARE_PROVIDER_SITE_OTHER): Payer: PPO | Admitting: Family Medicine

## 2017-08-03 ENCOUNTER — Encounter: Payer: Self-pay | Admitting: Family Medicine

## 2017-08-03 VITALS — BP 126/62 | HR 66 | Temp 98.6°F | Ht 62.0 in | Wt 217.2 lb

## 2017-08-03 DIAGNOSIS — M5441 Lumbago with sciatica, right side: Secondary | ICD-10-CM | POA: Diagnosis not present

## 2017-08-03 DIAGNOSIS — R490 Dysphonia: Secondary | ICD-10-CM

## 2017-08-03 DIAGNOSIS — G8929 Other chronic pain: Secondary | ICD-10-CM | POA: Diagnosis not present

## 2017-08-03 DIAGNOSIS — K912 Postsurgical malabsorption, not elsewhere classified: Secondary | ICD-10-CM | POA: Diagnosis not present

## 2017-08-03 LAB — VITAMIN D 25 HYDROXY (VIT D DEFICIENCY, FRACTURES): VITD: 34.98 ng/mL (ref 30.00–100.00)

## 2017-08-03 LAB — VITAMIN B12: Vitamin B-12: >1500 pg/mL — ABNORMAL HIGH (ref 211–911)

## 2017-08-03 MED ORDER — BACLOFEN 10 MG PO TABS: 10.0000 mg | ORAL_TABLET | Freq: Two times a day (BID) | ORAL | 0 refills | Status: DC

## 2017-08-04 LAB — IRON,TIBC AND FERRITIN PANEL
%SAT: 18 % (calc) (ref 16–45)
Ferritin: 17 ng/mL (ref 16–288)
Iron: 66 ug/dL (ref 45–160)
TIBC: 364 mcg/dL (calc) (ref 250–450)

## 2017-08-14 DIAGNOSIS — K219 Gastro-esophageal reflux disease without esophagitis: Secondary | ICD-10-CM | POA: Diagnosis not present

## 2017-08-14 DIAGNOSIS — R490 Dysphonia: Secondary | ICD-10-CM | POA: Diagnosis not present

## 2017-08-19 ENCOUNTER — Telehealth: Payer: Self-pay | Admitting: Family Medicine

## 2017-08-19 DIAGNOSIS — G8929 Other chronic pain: Secondary | ICD-10-CM

## 2017-08-19 DIAGNOSIS — M5441 Lumbago with sciatica, right side: Principal | ICD-10-CM

## 2017-08-19 NOTE — Telephone Encounter (Signed)
See note

## 2017-08-19 NOTE — Telephone Encounter (Signed)
Copied from [place] [id number]. Topic: Quick Communication - Rx Refill/Question >> Aug 19, 2017  8:49 AM Cecelia Byars, NT wrote: Medication: baclofen (LIORESAL) 10 MG tablet  this refill should have been sent to the below pharmacy instead of  [facility]   Has the patient contacted their pharmacy? yes  (Agent: If no, request that the patient contact the pharmacy for the refill. (Agent: If yes, when and what did the pharmacy advise?)  Preferred Pharmacy (with phone number or street name [place], [place] [phone] (Phone) [phone] (Fax)      Agent: Please be advised that RX refills may take up to 3 business days. We ask that you follow-up with your pharmacy.

## 2017-08-19 NOTE — Telephone Encounter (Signed)
Please advise on refill.

## 2017-08-21 MED ORDER — BACLOFEN 10 MG PO TABS: 10.0000 mg | ORAL_TABLET | Freq: Two times a day (BID) | ORAL | 2 refills | Status: DC

## 2017-08-21 NOTE — Addendum Note (Signed)
Addended by: Durwin Glaze on: 08/21/2017 07:05 AM   Modules accepted: Orders

## 2017-08-21 NOTE — Telephone Encounter (Signed)
Sent to patients pharmacy with 2 refills.

## 2017-08-21 NOTE — Telephone Encounter (Signed)
Resent prescription to the pill pack pharmacy.

## 2017-08-30 NOTE — Progress Notes (Signed)
Tricia [name] is a 72 y.o. female here for an acute visit.  History of Present Illness:   HPI:  Edema of lower legs, L > R. No calf pain, trauma, redness, or swollen joints. Hx of lumbar DJD. Generally does well, but pain in left lower back and medial knee when she cleans her house. Still with feelings of things getting stuck in her throat. ENT cleared. Dx with reflux induced cough. Taking Protonix at night now and it is helping. Last EGD was 5 years ago. Hx of gastric bypass.   PMHx, SurgHx, SocialHx, Medications, and Allergies were reviewed in the Visit Navigator and updated as appropriate.  Current Medications:   .  albuterol (PROVENTIL HFA;VENTOLIN HFA) 108 (90 Base) MCG/ACT inhaler, Inhale 2 puffs into the lungs every 6 (six) hours as needed for wheezing or shortness of breath., Disp: 1 Inhaler, Rfl: 3 .  amLODipine (NORVASC) 5 MG tablet, Take 1 tablet (5 mg total) by mouth daily., Disp: 90 tablet, Rfl: 3 .  aspirin EC 81 MG tablet, Take 81 mg by mouth daily., Disp: , Rfl:  .  atorvastatin (LIPITOR) 80 MG tablet, Take 1 tablet (80 mg total) by mouth daily., Disp: 90 tablet, Rfl: 2 .  baclofen (LIORESAL) 10 MG tablet, Take 1 tablet (10 mg total) by mouth 2 (two) times daily., Disp: 60 each, Rfl: 2 .  Biotin 10000 MCG TABS, Take 10,000 mcg by mouth daily. Dissolvable tablets, Disp: , Rfl:  .  blood glucose meter kit and supplies KIT, Dispense based on patient and insurance preference. Use to test daily. (FOR ICD-9 250.00, 250.01). 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  carvedilol (COREG) 3.125 MG tablet, Pt to take 3.125 mg with the carvedilol 6.25 mg  for a total of 9.375  Twice a day., Disp: 180 tablet, Rfl: 3 .  carvedilol (COREG) 6.25 MG tablet, Pt to take carvedilol 6.25 mg with the carvedilol 3.125 mg for a total of 9.375 mg twice da day., Disp: 180 tablet, Rfl: 3 .  clopidogrel (PLAVIX) 75 MG tablet, Take 1 tablet (75 mg total) by mouth daily., Disp: 90 tablet, Rfl: 2 .   denosumab (PROLIA) 60 MG/ML SOSY injection, Inject 60 mg into the skin every 6 (six) months., Disp: , Rfl:  .  gabapentin (NEURONTIN) 100 MG capsule, TAKE 1 CAPSULE BY MOUTH THREE TIMES A DAY, Disp: 270 capsule, Rfl: 1 .  glucose blood (FREESTYLE LITE) test strip, TEST UP TO FOUR TIMES DAILY Dx: E11.22, Disp: 360 each, Rfl: 5 .  HYDROcodone-acetaminophen (NORCO) 10-325 MG tablet, Take 1 tablet by mouth every 8 (eight) hours as needed., Disp: 30 tablet, Rfl: 0 .  Lancets (FREESTYLE) lancets, TEST UP TO FOUR TIMES DAILY Dx: E11.22, Disp: 360 each, Rfl: 5 .  nitroGLYCERIN (NITROLINGUAL) 0.4 MG/SPRAY spray, Place 1 spray under the tongue every 5 (five) minutes x 3 doses as needed for chest pain., Disp: 4.9 g, Rfl: 3 .  pantoprazole (PROTONIX) 40 MG tablet, Take 1 tablet (40 mg total) by mouth daily., Disp: 90 tablet, Rfl: 2 .  valsartan (DIOVAN) 320 MG tablet, Take 1 tablet (320 mg total) by mouth daily., Disp: 30 tablet, Rfl: 3 .  furosemide (LASIX) 20 MG tablet, Take 1 tablet (20 mg total) by mouth daily., Disp: 30 tablet, Rfl: 3 .  potassium chloride (K-DUR) 10 MEQ tablet, Take 1 tablet (10 mEq total) by mouth 2 (two) times daily., Disp: 60 tablet, Rfl:   Allergies  Allergen Reactions  .  Sulfa Antibiotics Diarrhea and Nausea And Vomiting   Review of Systems:   Pertinent items are noted in the HPI. Otherwise, ROS is negative.  Vitals:   Vitals:   08/31/17 0722  BP: 132/80  Pulse: (!) 57  Temp: 98.2 F (36.8 C)  TempSrc: Oral  SpO2: 97%  Weight: 220 lb 9.6 oz (100.1 kg)     Body mass index is 40.35 kg/m.  Physical Exam:   Physical Exam  Constitutional: She appears well-nourished.  HENT:  Head: Normocephalic and atraumatic.  Eyes: Pupils are equal, round, and reactive to light. EOM are normal.  Neck: Normal range of motion. Neck supple.  Cardiovascular: Regular rhythm, normal heart sounds and intact distal pulses. Bradycardia present.  Pulmonary/Chest: Effort normal.    Abdominal: Soft.  Musculoskeletal: She exhibits edema.  Skin: Skin is warm.  Psychiatric: She has a normal mood and affect. Her behavior is normal.  Nursing note and vitals reviewed.    Results for orders placed or performed in visit on 08/03/17  VITAMIN D 25 Hydroxy (Vit-D Deficiency, Fractures)  Result Value Ref Range   VITD 34.98 30.00 - 100.00 ng/mL  Iron, TIBC and Ferritin Panel  Result Value Ref Range   Iron 66 45 - 160 mcg/dL   TIBC 364 250 - 450 mcg/dL (calc)   %SAT 18 16 - 45 % (calc)   Ferritin 17 16 - 288 ng/mL  B12  Result Value Ref Range   Vitamin B-12 >1500 (H) 211 - 911 pg/mL    Assessment and Plan:   Tricia [name] was seen today for foot swelling.  Diagnoses and all orders for this visit:  Lower extremity edema Comments: Continue fiber and try smooth move tea. Orders: -     furosemide (LASIX) 20 MG tablet; Take 1 tablet (20 mg total) by mouth daily. -     potassium chloride (K-DUR) 10 MEQ tablet; Take 1 tablet (10 mEq total) by mouth 2 (two) times daily.  Controlled type 2 diabetes mellitus with complication, without long-term current use of insulin (HCC)  History of ST elevation myocardial infarction (STEMI)  Osteoarthritis of spine with radiculopathy, lumbar region -     Ambulatory referral to Physical Therapy  Gastroesophageal reflux disease, esophagitis presence not specified -     Ambulatory referral to Gastroenterology  Chronic constipation  Hypertension associated with diabetes [facility]) Comments: Advised her to call Cardiology if she experience CP, SOB, dizziness. On high dose of BB with bradycardia but no issues.    . Reviewed expectations re: course of current medical issues. . Discussed self-management of symptoms. . Outlined signs and symptoms indicating need for more acute intervention. . Patient verbalized understanding and all questions were answered. Marland Kitchen Health Maintenance issues including appropriate healthy diet, exercise, and smoking  avoidance were discussed with patient. . See orders for this visit as documented in the electronic medical record. . Patient received an After Visit Summary.  Briscoe Deutscher, DO [place], [facility] 08/31/2017

## 2017-08-31 ENCOUNTER — Encounter: Payer: Self-pay | Admitting: Family Medicine

## 2017-08-31 ENCOUNTER — Ambulatory Visit (INDEPENDENT_AMBULATORY_CARE_PROVIDER_SITE_OTHER): Payer: PPO | Admitting: Family Medicine

## 2017-08-31 VITALS — BP 132/80 | HR 57 | Temp 98.2°F | Wt 220.6 lb

## 2017-08-31 DIAGNOSIS — I152 Hypertension secondary to endocrine disorders: Secondary | ICD-10-CM

## 2017-08-31 DIAGNOSIS — M4726 Other spondylosis with radiculopathy, lumbar region: Secondary | ICD-10-CM

## 2017-08-31 DIAGNOSIS — I252 Old myocardial infarction: Secondary | ICD-10-CM

## 2017-08-31 DIAGNOSIS — E1159 Type 2 diabetes mellitus with other circulatory complications: Secondary | ICD-10-CM | POA: Diagnosis not present

## 2017-08-31 DIAGNOSIS — I10 Essential (primary) hypertension: Secondary | ICD-10-CM

## 2017-08-31 DIAGNOSIS — E118 Type 2 diabetes mellitus with unspecified complications: Secondary | ICD-10-CM

## 2017-08-31 DIAGNOSIS — K5909 Other constipation: Secondary | ICD-10-CM | POA: Diagnosis not present

## 2017-08-31 DIAGNOSIS — K219 Gastro-esophageal reflux disease without esophagitis: Secondary | ICD-10-CM | POA: Diagnosis not present

## 2017-08-31 DIAGNOSIS — R600 Localized edema: Secondary | ICD-10-CM | POA: Diagnosis not present

## 2017-08-31 MED ORDER — POTASSIUM CHLORIDE ER 10 MEQ PO TBCR: 10.0000 meq | EXTENDED_RELEASE_TABLET | Freq: Two times a day (BID) | ORAL | 0 refills | Status: DC

## 2017-08-31 MED ORDER — FUROSEMIDE 20 MG PO TABS: 20.0000 mg | ORAL_TABLET | Freq: Every day | ORAL | 3 refills | Status: DC

## 2017-08-31 NOTE — Patient Instructions (Signed)
TRY SMOOTH MOVE TEA.

## 2017-09-03 ENCOUNTER — Telehealth: Payer: Self-pay | Admitting: Physical Therapy

## 2017-09-03 ENCOUNTER — Ambulatory Visit: Payer: PPO | Admitting: Physical Therapy

## 2017-09-03 NOTE — Telephone Encounter (Signed)
I spoke with the patient after she called the [facility] to reschedule her PT appointment that she no showed for. I advised that the patient that we talked on the phone on 09/01/17 and made the appointment together. She stated that she remembers but missed the appointment. I told her that I would reschedule this one time and she would receive a $50 statement for the no show appointment.   Patient has been rescheduled and states she understands the no show policy going forward.

## 2017-09-07 ENCOUNTER — Encounter: Payer: Self-pay | Admitting: Physical Therapy

## 2017-09-07 ENCOUNTER — Ambulatory Visit: Payer: PPO | Admitting: Physical Therapy

## 2017-09-07 ENCOUNTER — Telehealth: Payer: Self-pay | Admitting: Family Medicine

## 2017-09-07 ENCOUNTER — Other Ambulatory Visit: Payer: Self-pay

## 2017-09-07 DIAGNOSIS — M5441 Lumbago with sciatica, right side: Secondary | ICD-10-CM

## 2017-09-07 DIAGNOSIS — G8929 Other chronic pain: Secondary | ICD-10-CM | POA: Diagnosis not present

## 2017-09-07 MED ORDER — ALBUTEROL SULFATE HFA 108 (90 BASE) MCG/ACT IN AERS: 2.0000 | INHALATION_SPRAY | Freq: Four times a day (QID) | RESPIRATORY_TRACT | 3 refills | Status: DC | PRN

## 2017-09-07 NOTE — Telephone Encounter (Signed)
Message sent to patient from my chart to pick up script.

## 2017-09-07 NOTE — Telephone Encounter (Signed)
Spoke with patient she would like 2 inhalers one to keep in bag and one to keep at home by bed. She would like two scripts one she can fill with insurance and one she can send to [place] to have filled.

## 2017-09-07 NOTE — Patient Instructions (Signed)
Access Code: [email or web address]  URL: [email or web address]  Date: 09/07/2017  Prepared by: Lyndee Hensen   Exercises  Supine Single Knee to Chest Stretch - 3 reps - 30 hold - 3x daily  Supine Lower Trunk Rotation - 10 reps - 10 hold - 2x daily  Supine Transversus Abdominis Bracing - Hands on Ground - 10 reps - 2 sets - 2x daily  Supine Hip Adduction Isometric with Ball - 10 reps - 2 sets - 2x daily  Supine March - 10 reps - 2 sets - 2x daily

## 2017-09-07 NOTE — Telephone Encounter (Signed)
MEDICATION: albuterol (proventil HFA; ventolin HFA) 108 (90 base)  MCG/CT inhaler  [facility] [address]  IS THIS A 90 DAY SUPPLY : no  IS PATIENT OUT OF MEDICATION: yes  IF NOT; HOW MUCH IS LEFT:   LAST APPOINTMENT DATE: @8 /02/2017  NEXT APPOINTMENT DATE:@8 /06/2017  OTHER COMMENTS:    **Let patient know to contact pharmacy at the end of the day to make sure medication is ready. **  ** Please notify patient to allow 48-72 hours to process**  **Encourage patient to contact the pharmacy for refills or they can request refills through [facility]**  MEDICATION: glucose blood test strip (Freestyle lite)   [facility] [address]  IS THIS A 90 DAY SUPPLY : yes  IS PATIENT OUT OF MEDICATION: no   IF NOT; HOW MUCH IS LEFT: "a couple"  LAST APPOINTMENT DATE: @8 /02/2017  NEXT APPOINTMENT DATE:@8 /06/2017  OTHER COMMENTS:    **Let patient know to contact pharmacy at the end of the day to make sure medication is ready. **  ** Please notify patient to allow 48-72 hours to process**  **Encourage patient to contact the pharmacy for refills or they can request refills through [facility]**

## 2017-09-07 NOTE — Telephone Encounter (Signed)
Error

## 2017-09-07 NOTE — Therapy (Signed)
[facility] [address] [place], [place], [address] Phone: [phone]   Fax:  [phone]  Physical Therapy Evaluation  Patient Details  Name: Tricia [name] MRN: [id number] Date of Birth: 1945/10/16 Referring Provider: Briscoe Deutscher   Encounter Date: 09/07/2017  PT End of Session - 09/07/17 1037    Visit Number  1    Number of Visits  12    Date for PT Re-Evaluation  10/19/17    Authorization Type  HTA    PT Start Time  0925    PT Stop Time  1018    PT Time Calculation (min)  53 min    Activity Tolerance  Patient tolerated treatment well    Behavior During Therapy  [facility] for tasks assessed/performed       Past Medical History:  Diagnosis Date  . Adenomatous polyp of colon 02/2004  . Anemia   . Anxiety   . Arthritis   . B12 deficiency   . CAD (coronary artery disease)    a. s/p remote BMS to LAD;  b. 09/2015 Inf STEMI/VF Arrest: LM nl, LAD 85p (staged PCI 2 days later w/ 3.0x32 Synergy DES), 40p/m, 25d, LCX 71m, RCA 80ost/100p (2.75x32 Synergy DES), 9m, EF 55-65%. // c. Myoview 2/18: EF 59, no ischemia or infarction; Normal study  . Dermatophytosis of groin and perianal area   . Diet Controlled Diabetes Mellitus   . Diverticulosis   . H/O echocardiogram    a. 09/2015 Echo: EF 60-65%, no rwma, mild AI, mildly dil RA, mild to mod TR.  Marland Kitchen Hyperlipidemia   . Hypertensive heart disease   . Low back pain    l5 disc  . Morbid obesity ([place])   . Osteoporosis   . PVC's (premature ventricular contractions) 04/16/2017   Holter 3/19: NSR, average heart rate 69, frequent PVCs (5% total beats), rare supraventricular ectopics, no AF/flutter  . Sleep apnea 10/03/2009   Resolved after gastric bypass    . TOBACCO ABUSE 10/05/2009   Qualifier: Diagnosis of  By: Stanford Breed, MD, Kandyce Rud   . Ventricular fibrillation ([place]) 10/01/2015   a. In setting of inferior STEMI.    Past Surgical History:  Procedure Laterality Date  . ABDOMINAL  HYSTERECTOMY    . APPENDECTOMY    . BARIATRIC SURGERY    . CARDIAC CATHETERIZATION N/A 10/01/2015   Procedure: Left Heart Cath and Coronary Angiography;  Surgeon: Leonie Man, MD;  Location: [facility];  Service: Cardiovascular;  Laterality: N/A;  . CARDIAC CATHETERIZATION N/A 10/01/2015   Procedure: Coronary Stent Intervention;  Surgeon: Leonie Man, MD;  Location: [facility];  Service: Cardiovascular;  Laterality: N/A;  . CARDIAC CATHETERIZATION N/A 10/03/2015   Procedure: Coronary Stent Intervention;  Surgeon: Troy Sine, MD;  Location: [facility];  Service: Cardiovascular;  Laterality: N/A;  . CARDIAC CATHETERIZATION N/A 10/03/2015   Procedure: Coronary/Graft Angiography;  Surgeon: Troy Sine, MD;  Location: [facility];  Service: Cardiovascular;  Laterality: N/A;  . CARPAL TUNNEL RELEASE Bilateral   . CERVICAL DISC SURGERY    . CHOLECYSTECTOMY    . CORONARY ANGIOPLASTY  2002  . HERNIA REPAIR    . LUMBAR LAMINECTOMY     L3-4  . NEPHRECTOMY     Partial  . TONSILLECTOMY    . TOTAL KNEE ARTHROPLASTY Left   . TUBAL LIGATION    . ULNAR NERVE REPAIR      There were no  vitals filed for this visit.   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 Pt is retired, but is active around the house. She is unable to walk for exercise due to pain, not interested in going to a gym. Pt states significant pain in lumbar region R>L, with standing and walking. She gets relief from sitting. She states "electric shocks" that used to go down her legs, but now she just has numbness in R lower leg.     Limitations  Standing;Walking;House hold activities;Lifting    Diagnostic tests  Recent x-ray (january)     Patient Stated Goals  decreased pain, improve mobility.     Currently in Pain?  Yes    Pain Score  5     Pain Location  Back    Pain Orientation  Right;Left;Lower    Pain  Descriptors / Indicators  Spasm    Pain Type  Chronic pain    Pain Onset  More than a month ago    Pain Frequency  Intermittent    Aggravating Factors   Standing, walking,     Pain Relieving Factors  sitting, laying, rest.          [facility] PT Assessment - 09/07/17 0001      Assessment   Medical Diagnosis  Back Pain    Referring Provider  Briscoe Deutscher    Prior Therapy  No      Precautions   Precautions  None      Balance Screen   Has the patient fallen in the past 6 months  No      Prior Function   Level of Independence  Independent      Cognition   Overall Cognitive Status  Within Functional Limits for tasks assessed      Posture/Postural Control   Posture Comments  Noted scoliosis; Standing: L hip higher than R;  foot posture: moderate/significant pronation bilaterally;       ROM / Strength   AROM / PROM / Strength  AROM;Strength      AROM   Overall AROM Comments  Lumbar: Flexion: mild/moderate deficit;  extension: mild deficit;   Side bending : mild deficit ;    Hips: R hip: mild limitation;        Strength   Overall Strength Comments  Hips: R: 3+/5,  L: 4-/5;   Core 3-/5       Palpation   Palpation comment  Pain in L and R SI region and low lumbar region; Pain in R central buttock;                  Objective measurements completed on examination: See above findings.      [facility] Adult PT Treatment/Exercise - 09/07/17 0001      Exercises   Exercises  Lumbar      Lumbar Exercises: Stretches   Single Knee to Chest Stretch  3 reps;30 seconds    Lower Trunk Rotation  10 seconds;5 reps      Lumbar Exercises: Supine   Ab Set  10 reps;5 seconds    Other Supine Lumbar Exercises  Hip Add Iso with TA x20;              PT Education - 09/07/17 1014    Education Details  PT POC, HEP     Person(s) Educated  Patient    Methods  Explanation;Handout  Comprehension  Verbalized understanding;Need further instruction       PT Short Term Goals -  09/07/17 1048      PT SHORT TERM GOAL #1   Title  Pt to be independent with inital HEP for back    Time  2    Period  Weeks    Status  New    Target Date  09/21/17        PT Long Term Goals - 09/07/17 1049      PT LONG TERM GOAL #1   Title  Pt to report decreased pain to 0-2/10 in lumbar region     Time  6    Status  New    Target Date  10/19/17      PT LONG TERM GOAL #2   Title  Pt to demo improved Hip and core strength to at least 4/5 to improve stability and pain.     Time  6    Period  Weeks    Status  New    Target Date  10/19/17      PT LONG TERM GOAL #3   Title  Pt to be independent with final HEP    Time  6    Period  Weeks    Status  New    Target Date  10/19/17             Plan - 09/07/17 1248    Clinical Impression Statement  Pt presents with primary complaint of increased pain in low back that is chronic in nature. She has very poor stability and strength in core and hips, with lack of effective HEP for spine. She has mild ROM deficits for lumbar and hips as well. She has increased pain with standing and walking, which limit her ability for sustaining standing position for functional activities. She has decreased ability for walking, stairs, IADLS, and community activites. Pt to benefit from skilled PT to improve pain, strength, and instruct pt on HEP for spine.      Clinical Presentation  Stable    Clinical Decision Making  Low    Rehab Potential  Good    PT Frequency  2x / week    PT Duration  6 weeks    PT Treatment/Interventions  ADLs/Self Care Home Management;Cryotherapy;Electrical Stimulation;Iontophoresis 4mg /ml Dexamethasone;Moist Heat;Therapeutic activities;Functional mobility training;Stair training;Gait training;DME Instruction;Ultrasound;Therapeutic exercise;Balance training;Neuromuscular re-education;Patient/family education;Orthotic Fit/Training;Dry needling;Passive range of motion;Manual techniques;Taping    Consulted and Agree with Plan of  Care  Patient       Patient will benefit from skilled therapeutic intervention in order to improve the following deficits and impairments:  Abnormal gait, Decreased endurance, Decreased activity tolerance, Decreased strength, Pain, Increased muscle spasms, Difficulty walking, Decreased mobility, Decreased range of motion, Improper body mechanics  Visit Diagnosis: Chronic bilateral low back pain with right-sided sciatica     Problem List Patient Active Problem List   Diagnosis Date Noted  . PVC's (premature ventricular contractions) 04/16/2017  . Hyperparathyroidism, secondary ([place]) 07/06/2016  . Right renal atrophy 04/19/2016  . COPD (chronic obstructive pulmonary disease) ([place]) 04/19/2016  . Aortic atherosclerosis ([place]) 04/19/2016  . Atherosclerosis of arteries 04/19/2016  . Sleep apnea, not on CPAP   . Diverticulosis   . Coronary artery disease involving native coronary artery of native heart without angina pectoris 03/10/2016  . Rib pain - s/p CPR 10/08/2015  . History of cardiac arrest 10/01/2015  . History of ST elevation myocardial infarction (STEMI) 10/01/2015  . Second hand smoke exposure  07/28/2015  . Postoperative malabsorption 07/27/2015  . Recurrent UTI 03/14/2014  . GERD (gastroesophageal reflux disease) 02/14/2014  . History of gastric bypass 02/14/2014  . Former smoker 02/14/2014  . Diabetes mellitus type II, controlled ([place]) 02/14/2014  . Anemia, B12 deficiency 04/04/2009  . Osteoporosis 12/14/2006  . Hyperlipidemia associated with type 2 diabetes mellitus ([place]) 09/22/2006  . Hypertension associated with diabetes ([place]) 09/22/2006  . Osteoarthritis 09/22/2006  . 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 Adenomatous polyp of colon 02/04/2004    Lyndee Hensen, PT, DPT 12:53 PM  09/07/17    [facility] [place], [place], [address] Phone: [phone]   Fax:   [phone]  Name: Tricia [name] MRN: [id number] Date of Birth: 03/19/1945

## 2017-09-07 NOTE — Telephone Encounter (Signed)
Patient would like to know if she can have two inhalers.

## 2017-09-07 NOTE — Telephone Encounter (Signed)
Okay to do?

## 2017-09-07 NOTE — Telephone Encounter (Signed)
Pt is in app with PT right now reception will let me know when she gets out.

## 2017-09-09 ENCOUNTER — Ambulatory Visit: Payer: PPO | Admitting: Physical Therapy

## 2017-09-09 ENCOUNTER — Encounter: Payer: Self-pay | Admitting: Physical Therapy

## 2017-09-09 DIAGNOSIS — M5441 Lumbago with sciatica, right side: Secondary | ICD-10-CM

## 2017-09-09 DIAGNOSIS — G8929 Other chronic pain: Secondary | ICD-10-CM | POA: Diagnosis not present

## 2017-09-09 NOTE — Therapy (Signed)
[facility] [address] [place], [place], [address] Phone: [phone]   Fax:  [phone]  Physical Therapy Treatment  Patient Details  Name: Tricia [name] MRN: [id number] Date of Birth: [birth year] Referring Provider: Briscoe Deutscher   Encounter Date: 09/09/2017  PT End of Session - 09/09/17 1029    Visit Number  2    Number of Visits  12    Date for PT Re-Evaluation  10/19/17    Authorization Type  HTA    PT Start Time  1018    PT Stop Time  1102    PT Time Calculation (min)  44 min    Activity Tolerance  Patient tolerated treatment well    Behavior During Therapy  [facility] for tasks assessed/performed       Past Medical History:  Diagnosis Date  . Adenomatous polyp of colon 02/2004  . Anemia   . Anxiety   . Arthritis   . B12 deficiency   . CAD (coronary artery disease)    a. s/p remote BMS to LAD;  b. 09/2015 Inf STEMI/VF Arrest: LM nl, LAD 85p (staged PCI 2 days later w/ 3.0x32 Synergy DES), 40p/m, 25d, LCX 78m, RCA 80ost/100p (2.75x32 Synergy DES), 74m, EF 55-65%. // c. Myoview 2/18: EF 59, no ischemia or infarction; Normal study  . Dermatophytosis of groin and perianal area   . Diet Controlled Diabetes Mellitus   . Diverticulosis   . H/O echocardiogram    a. 09/2015 Echo: EF 60-65%, no rwma, mild AI, mildly dil RA, mild to mod TR.  Marland Kitchen Hyperlipidemia   . Hypertensive heart disease   . Low back pain    l5 disc  . Morbid obesity ([place])   . Osteoporosis   . PVC's (premature ventricular contractions) 04/16/2017   Holter 3/19: NSR, average heart rate 69, frequent PVCs (5% total beats), rare supraventricular ectopics, no AF/flutter  . Sleep apnea 10/03/2009   Resolved after gastric bypass    . TOBACCO ABUSE 10/05/2009   Qualifier: Diagnosis of  By: Stanford Breed, MD, Kandyce Rud   . Ventricular fibrillation ([place]) 10/01/2015   a. In setting of inferior STEMI.    Past Surgical History:  Procedure Laterality Date  . ABDOMINAL  HYSTERECTOMY    . APPENDECTOMY    . BARIATRIC SURGERY    . CARDIAC CATHETERIZATION N/A 10/01/2015   Procedure: Left Heart Cath and Coronary Angiography;  Surgeon: Leonie Man, MD;  Location: [facility];  Service: Cardiovascular;  Laterality: N/A;  . CARDIAC CATHETERIZATION N/A 10/01/2015   Procedure: Coronary Stent Intervention;  Surgeon: Leonie Man, MD;  Location: [facility];  Service: Cardiovascular;  Laterality: N/A;  . CARDIAC CATHETERIZATION N/A 10/03/2015   Procedure: Coronary Stent Intervention;  Surgeon: Troy Sine, MD;  Location: [facility];  Service: Cardiovascular;  Laterality: N/A;  . CARDIAC CATHETERIZATION N/A 10/03/2015   Procedure: Coronary/Graft Angiography;  Surgeon: Troy Sine, MD;  Location: [facility];  Service: Cardiovascular;  Laterality: N/A;  . CARPAL TUNNEL RELEASE Bilateral   . CERVICAL DISC SURGERY    . CHOLECYSTECTOMY    . CORONARY ANGIOPLASTY  2002  . HERNIA REPAIR    . LUMBAR LAMINECTOMY     L3-4  . NEPHRECTOMY     Partial  . TONSILLECTOMY    . TOTAL KNEE ARTHROPLASTY Left   . TUBAL LIGATION    . ULNAR NERVE REPAIR      There were no  vitals filed for this visit.  Subjective Assessment - 09/09/17 1026    Subjective  Pt states minimal pain today, she has not been doing  any standing/walking yet.     Currently in Pain?  No/denies                       [facility] Adult PT Treatment/Exercise - 09/09/17 1016      Posture/Postural Control   Posture Comments  --      Exercises   Exercises  Lumbar      Lumbar Exercises: Stretches   Active Hamstring Stretch  3 reps;30 seconds    Single Knee to Chest Stretch  3 reps;30 seconds    Lower Trunk Rotation  10 seconds;5 reps    Pelvic Tilt  20 reps      Lumbar Exercises: Standing   Other Standing Lumbar Exercises  Rows RTB x20;       Lumbar Exercises: Supine   Ab Set  10 reps;5 seconds    Clam  20 reps    Clam Limitations  RTB    Other Supine  Lumbar Exercises  Hip Add Iso with TA x20;                PT Short Term Goals - 09/07/17 1048      PT SHORT TERM GOAL #1   Title  Pt to be independent with inital HEP for back    Time  2    Period  Weeks    Status  New    Target Date  09/21/17        PT Long Term Goals - 09/07/17 1049      PT LONG TERM GOAL #1   Title  Pt to report decreased pain to 0-2/10 in lumbar region     Time  6    Status  New    Target Date  10/19/17      PT LONG TERM GOAL #2   Title  Pt to demo improved Hip and core strength to at least 4/5 to improve stability and pain.     Time  6    Period  Weeks    Status  New    Target Date  10/19/17      PT LONG TERM GOAL #3   Title  Pt to be independent with final HEP    Time  6    Period  Weeks    Status  New    Target Date  10/19/17            Plan - 09/09/17 1131    Clinical Impression Statement  HEP reviewed today, pt requires cueing for correct performance. Ther ex done and progressed today, for core stabilization. Pt with difficulty with TA contraction, and stabilization with LE movement. Pt will benefit from continued practice with this.     Rehab Potential  Good    PT Frequency  2x / week    PT Duration  6 weeks    PT Treatment/Interventions  ADLs/Self Care Home Management;Cryotherapy;Electrical Stimulation;Iontophoresis 4mg /ml Dexamethasone;Moist Heat;Therapeutic activities;Functional mobility training;Stair training;Gait training;DME Instruction;Ultrasound;Therapeutic exercise;Balance training;Neuromuscular re-education;Patient/family education;Orthotic Fit/Training;Dry needling;Passive range of motion;Manual techniques;Taping    Consulted and Agree with Plan of Care  Patient       Patient will benefit from skilled therapeutic intervention in order to improve the following deficits and impairments:  Abnormal gait, Decreased endurance, Decreased activity tolerance, Decreased strength, Pain, Increased muscle spasms, Difficulty  walking, Decreased  mobility, Decreased range of motion, Improper body mechanics  Visit Diagnosis: Chronic bilateral low back pain with right-sided sciatica     Problem List Patient Active Problem List   Diagnosis Date Noted  . PVC's (premature ventricular contractions) 04/16/2017  . Hyperparathyroidism, secondary ([place]) 07/06/2016  . Right renal atrophy 04/19/2016  . COPD (chronic obstructive pulmonary disease) ([place]) 04/19/2016  . Aortic atherosclerosis ([place]) 04/19/2016  . Atherosclerosis of arteries 04/19/2016  . Sleep apnea, not on CPAP   . Diverticulosis   . Coronary artery disease involving native coronary artery of native heart without angina pectoris 03/10/2016  . Rib pain - s/p CPR 10/08/2015  . History of cardiac arrest 10/01/2015  . History of ST elevation myocardial infarction (STEMI) 10/01/2015  . Second hand smoke exposure 07/28/2015  . Postoperative malabsorption 07/27/2015  . Recurrent UTI 03/14/2014  . GERD (gastroesophageal reflux disease) 02/14/2014  . History of gastric bypass 02/14/2014  . Former smoker 02/14/2014  . Diabetes mellitus type II, controlled ([place]) 02/14/2014  . Anemia, B12 deficiency 04/04/2009  . Osteoporosis 12/14/2006  . Hyperlipidemia associated with type 2 diabetes mellitus ([place]) 09/22/2006  . Hypertension associated with diabetes ([place]) 09/22/2006  . Osteoarthritis 09/22/2006  . 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 Adenomatous polyp of colon 02/04/2004    Lyndee Hensen, PT, DPT 11:33 AM  09/09/17    [facility] [place], [place], [address] Phone: [phone]   Fax:  [phone]  Name: Tricia [name] MRN: [id number] Date of Birth: 1945/07/13

## 2017-09-11 ENCOUNTER — Telehealth: Payer: Self-pay | Admitting: Family Medicine

## 2017-09-11 NOTE — Telephone Encounter (Signed)
Copied from [facility] [id number]. Topic: General - Other >> Aug 27, 2017  9:56 AM Synthia Innocent wrote: CRM for notification. .Checking status of prolia injection through [facility]   Please follow up on this.

## 2017-09-14 ENCOUNTER — Encounter: Payer: Self-pay | Admitting: Physical Therapy

## 2017-09-14 ENCOUNTER — Ambulatory Visit: Payer: PPO | Admitting: Physical Therapy

## 2017-09-14 DIAGNOSIS — G8929 Other chronic pain: Secondary | ICD-10-CM | POA: Diagnosis not present

## 2017-09-14 DIAGNOSIS — M5441 Lumbago with sciatica, right side: Secondary | ICD-10-CM

## 2017-09-14 NOTE — Telephone Encounter (Signed)
Patient has dropped off copy of bill. I will submit to [employer]

## 2017-09-14 NOTE — Therapy (Signed)
[place] [address] [place], [place], [address] Phone: [phone]   Fax:  [phone]  Physical Therapy Treatment  Patient Details  Name: Tricia [name] MRN: [id number] Date of Birth: 1945-10-27 Referring Provider: Briscoe Deutscher   Encounter Date: 09/14/2017  PT End of Session - 09/14/17 0812    Visit Number  3    Number of Visits  12    Date for PT Re-Evaluation  10/19/17    Authorization Type  HTA    PT Start Time  0806    PT Stop Time  0845    PT Time Calculation (min)  39 min    Activity Tolerance  Patient tolerated treatment well    Behavior During Therapy  [facility] for tasks assessed/performed       Past Medical History:  Diagnosis Date  . Adenomatous polyp of colon 02/2004  . Anemia   . Anxiety   . Arthritis   . B12 deficiency   . CAD (coronary artery disease)    a. s/p remote BMS to LAD;  b. 09/2015 Inf STEMI/VF Arrest: LM nl, LAD 85p (staged PCI 2 days later w/ 3.0x32 Synergy DES), 40p/m, 25d, LCX 62m, RCA 80ost/100p (2.75x32 Synergy DES), 55m, EF 55-65%. // c. Myoview 2/18: EF 59, no ischemia or infarction; Normal study  . Dermatophytosis of groin and perianal area   . Diet Controlled Diabetes Mellitus   . Diverticulosis   . H/O echocardiogram    a. 09/2015 Echo: EF 60-65%, no rwma, mild AI, mildly dil RA, mild to mod TR.  Marland Kitchen Hyperlipidemia   . Hypertensive heart disease   . Low back pain    l5 disc  . Morbid obesity ([place])   . Osteoporosis   . PVC's (premature ventricular contractions) 04/16/2017   Holter 3/19: NSR, average heart rate 69, frequent PVCs (5% total beats), rare supraventricular ectopics, no AF/flutter  . Sleep apnea 10/03/2009   Resolved after gastric bypass    . TOBACCO ABUSE 10/05/2009   Qualifier: Diagnosis of  By: Stanford Breed, MD, Kandyce Rud   . Ventricular fibrillation ([place]) 10/01/2015   a. In setting of inferior STEMI.    Past Surgical History:  Procedure Laterality Date  . ABDOMINAL  HYSTERECTOMY    . APPENDECTOMY    . BARIATRIC SURGERY    . CARDIAC CATHETERIZATION N/A 10/01/2015   Procedure: Left Heart Cath and Coronary Angiography;  Surgeon: Leonie Man, MD;  Location: [place] [facility];  Service: Cardiovascular;  Laterality: N/A;  . CARDIAC CATHETERIZATION N/A 10/01/2015   Procedure: Coronary Stent Intervention;  Surgeon: Leonie Man, MD;  Location: [place] [facility];  Service: Cardiovascular;  Laterality: N/A;  . CARDIAC CATHETERIZATION N/A 10/03/2015   Procedure: Coronary Stent Intervention;  Surgeon: Troy Sine, MD;  Location: [place] [facility];  Service: Cardiovascular;  Laterality: N/A;  . CARDIAC CATHETERIZATION N/A 10/03/2015   Procedure: Coronary/Graft Angiography;  Surgeon: Troy Sine, MD;  Location: [place] [facility];  Service: Cardiovascular;  Laterality: N/A;  . CARPAL TUNNEL RELEASE Bilateral   . CERVICAL DISC SURGERY    . CHOLECYSTECTOMY    . CORONARY ANGIOPLASTY  2002  . HERNIA REPAIR    . LUMBAR LAMINECTOMY     L3-4  . NEPHRECTOMY     Partial  . TONSILLECTOMY    . TOTAL KNEE ARTHROPLASTY Left   . TUBAL LIGATION    . ULNAR NERVE REPAIR      There were no  vitals filed for this visit.  Subjective Assessment - 09/14/17 0809    Subjective  Pt states she is still getting "electric shocks" in her R leg. She states minimal pain over the weekend,     Currently in Pain?  No/denies    Pain Score  0-No pain                       [facility] Adult PT Treatment/Exercise - 09/14/17 0813      Exercises   Exercises  Lumbar      Lumbar Exercises: Stretches   Active Hamstring Stretch  3 reps;30 seconds    Single Knee to Chest Stretch  3 reps;30 seconds    Lower Trunk Rotation  --    Pelvic Tilt  20 reps      Lumbar Exercises: Standing   Other Standing Lumbar Exercises  Rows RTB x20;       Lumbar Exercises: Supine   Ab Set  10 reps;5 seconds    Clam  20 reps    Clam Limitations  RTB    Bent Knee Raise  20 reps    Bent  Knee Raise Limitations  with TA    Straight Leg Raise  2 seconds;5 reps    Straight Leg Raises Limitations  with TA    Other Supine Lumbar Exercises  Hip Add Iso with TA x20;                PT Short Term Goals - 09/07/17 1048      PT SHORT TERM GOAL #1   Title  Pt to be independent with inital HEP for back    Time  2    Period  Weeks    Status  New    Target Date  09/21/17        PT Long Term Goals - 09/07/17 1049      PT LONG TERM GOAL #1   Title  Pt to report decreased pain to 0-2/10 in lumbar region     Time  6    Status  New    Target Date  10/19/17      PT LONG TERM GOAL #2   Title  Pt to demo improved Hip and core strength to at least 4/5 to improve stability and pain.     Time  6    Period  Weeks    Status  New    Target Date  10/19/17      PT LONG TERM GOAL #3   Title  Pt to be independent with final HEP    Time  6    Period  Weeks    Status  New    Target Date  10/19/17            Plan - 09/14/17 1006    Clinical Impression Statement  Pt requires cueing for correct mechanics with ther ex, and for core stabilization. Discussed recommendations for pt to start walking in her home, for a few min, 2x/day. She stopped walking for exercise about a year ago, because she was having increased pain in her R hip at the time. Plan to progress core and LE strength as tolerated.     Rehab Potential  Good    PT Frequency  2x / week    PT Duration  6 weeks    PT Treatment/Interventions  ADLs/Self Care Home Management;Cryotherapy;Electrical Stimulation;Iontophoresis 4mg /ml Dexamethasone;Moist Heat;Therapeutic activities;Functional mobility training;Stair training;Gait training;DME Instruction;Ultrasound;Therapeutic exercise;Balance training;Neuromuscular re-education;Patient/family  education;Orthotic Fit/Training;Dry needling;Passive range of motion;Manual techniques;Taping    Consulted and Agree with Plan of Care  Patient       Patient will benefit from  skilled therapeutic intervention in order to improve the following deficits and impairments:  Abnormal gait, Decreased endurance, Decreased activity tolerance, Decreased strength, Pain, Increased muscle spasms, Difficulty walking, Decreased mobility, Decreased range of motion, Improper body mechanics  Visit Diagnosis: Chronic bilateral low back pain with right-sided sciatica     Problem List Patient Active Problem List   Diagnosis Date Noted  . PVC's (premature ventricular contractions) 04/16/2017  . Hyperparathyroidism, secondary ([place]) 07/06/2016  . Right renal atrophy 04/19/2016  . COPD (chronic obstructive pulmonary disease) ([place]) 04/19/2016  . Aortic atherosclerosis ([place]) 04/19/2016  . Atherosclerosis of arteries 04/19/2016  . Sleep apnea, not on CPAP   . Diverticulosis   . Coronary artery disease involving native coronary artery of native heart without angina pectoris 03/10/2016  . Rib pain - s/p CPR 10/08/2015  . History of cardiac arrest 10/01/2015  . History of ST elevation myocardial infarction (STEMI) 10/01/2015  . Second hand smoke exposure 07/28/2015  . Postoperative malabsorption 07/27/2015  . Recurrent UTI 03/14/2014  . GERD (gastroesophageal reflux disease) 02/14/2014  . History of gastric bypass 02/14/2014  . Former smoker 02/14/2014  . Diabetes mellitus type II, controlled ([place]) 02/14/2014  . Anemia, B12 deficiency 04/04/2009  . Osteoporosis 12/14/2006  . Hyperlipidemia associated with type 2 diabetes mellitus ([place]) 09/22/2006  . Hypertension associated with diabetes ([place]) 09/22/2006  . Osteoarthritis 09/22/2006  . 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 Adenomatous polyp of colon 02/04/2004    Lyndee Hensen, PT, DPT 10:09 AM  09/14/17    [facility] [address] [place], [place], [address] Phone: [phone]   Fax:  [phone]  Name: Tricia [name] MRN: [id number] Date of Birth:  1945-06-02

## 2017-09-16 ENCOUNTER — Encounter: Payer: Self-pay | Admitting: Physical Therapy

## 2017-09-16 ENCOUNTER — Ambulatory Visit: Payer: PPO | Admitting: Physical Therapy

## 2017-09-16 DIAGNOSIS — M5441 Lumbago with sciatica, right side: Secondary | ICD-10-CM | POA: Diagnosis not present

## 2017-09-16 DIAGNOSIS — G8929 Other chronic pain: Secondary | ICD-10-CM | POA: Diagnosis not present

## 2017-09-16 NOTE — Therapy (Signed)
[place] [address] [place], [place], [address] Phone: [phone]   Fax:  [phone]  Physical Therapy Treatment  Patient Details  Name: Tricia [name] MRN: [id number] Date of Birth: 20-Feb-1945 Referring Provider: Briscoe Deutscher   Encounter Date: 09/16/2017  PT End of Session - 09/16/17 0940    Visit Number  4    Number of Visits  12    Date for PT Re-Evaluation  10/19/17    Authorization Type  HTA    PT Start Time  0848    PT Stop Time  0930    PT Time Calculation (min)  42 min    Activity Tolerance  Patient tolerated treatment well    Behavior During Therapy  [facility] for tasks assessed/performed       Past Medical History:  Diagnosis Date  . Adenomatous polyp of colon 02/2004  . Anemia   . Anxiety   . Arthritis   . B12 deficiency   . CAD (coronary artery disease)    a. s/p remote BMS to LAD;  b. 09/2015 Inf STEMI/VF Arrest: LM nl, LAD 85p (staged PCI 2 days later w/ 3.0x32 Synergy DES), 40p/m, 25d, LCX 64m, RCA 80ost/100p (2.75x32 Synergy DES), 16m, EF 55-65%. // c. Myoview 2/18: EF 59, no ischemia or infarction; Normal study  . Dermatophytosis of groin and perianal area   . Diet Controlled Diabetes Mellitus   . Diverticulosis   . H/O echocardiogram    a. 09/2015 Echo: EF 60-65%, no rwma, mild AI, mildly dil RA, mild to mod TR.  Marland Kitchen Hyperlipidemia   . Hypertensive heart disease   . Low back pain    l5 disc  . Morbid obesity ([place])   . Osteoporosis   . PVC's (premature ventricular contractions) 04/16/2017   Holter 3/19: NSR, average heart rate 69, frequent PVCs (5% total beats), rare supraventricular ectopics, no AF/flutter  . Sleep apnea 10/03/2009   Resolved after gastric bypass    . TOBACCO ABUSE 10/05/2009   Qualifier: Diagnosis of  By: Stanford Breed, MD, Kandyce Rud   . Ventricular fibrillation ([place]) 10/01/2015   a. In setting of inferior STEMI.    Past Surgical History:  Procedure Laterality Date  . ABDOMINAL  HYSTERECTOMY    . APPENDECTOMY    . BARIATRIC SURGERY    . CARDIAC CATHETERIZATION N/A 10/01/2015   Procedure: Left Heart Cath and Coronary Angiography;  Surgeon: Leonie Man, MD;  Location: [place] [facility];  Service: Cardiovascular;  Laterality: N/A;  . CARDIAC CATHETERIZATION N/A 10/01/2015   Procedure: Coronary Stent Intervention;  Surgeon: Leonie Man, MD;  Location: [place] [facility];  Service: Cardiovascular;  Laterality: N/A;  . CARDIAC CATHETERIZATION N/A 10/03/2015   Procedure: Coronary Stent Intervention;  Surgeon: Troy Sine, MD;  Location: [place] [facility];  Service: Cardiovascular;  Laterality: N/A;  . CARDIAC CATHETERIZATION N/A 10/03/2015   Procedure: Coronary/Graft Angiography;  Surgeon: Troy Sine, MD;  Location: [place] [facility];  Service: Cardiovascular;  Laterality: N/A;  . CARPAL TUNNEL RELEASE Bilateral   . CERVICAL DISC SURGERY    . CHOLECYSTECTOMY    . CORONARY ANGIOPLASTY  2002  . HERNIA REPAIR    . LUMBAR LAMINECTOMY     L3-4  . NEPHRECTOMY     Partial  . TONSILLECTOMY    . TOTAL KNEE ARTHROPLASTY Left   . TUBAL LIGATION    . ULNAR NERVE REPAIR      There were no  vitals filed for this visit.  Subjective Assessment - 09/16/17 0939    Subjective  Pt states that she was able to grocery shop with minimal pain on Monday.  She was a bit sore after PT session Monday.     Currently in Pain?  Yes    Pain Score  3     Pain Location  Back    Pain Orientation  Right;Left    Pain Descriptors / Indicators  Spasm    Pain Type  Chronic pain    Pain Onset  More than a month ago    Pain Frequency  Intermittent                       [facility] Adult PT Treatment/Exercise - 09/16/17 0856      Exercises   Exercises  Lumbar      Lumbar Exercises: Stretches   Active Hamstring Stretch  3 reps;30 seconds    Single Knee to Chest Stretch  3 reps;30 seconds    Pelvic Tilt  20 reps      Lumbar Exercises: Aerobic   Stationary Bike  L1 x 7  min      Lumbar Exercises: Standing   Other Standing Lumbar Exercises  --      Lumbar Exercises: Supine   Ab Set  10 reps;5 seconds    Clam  20 reps    Clam Limitations  GTB    Bent Knee Raise  20 reps    Bent Knee Raise Limitations  with TA    Straight Leg Raise  --    Straight Leg Raises Limitations  --    Other Supine Lumbar Exercises  Hip Add Iso with TA x20;       Manual Therapy   Manual Therapy  Myofascial release;Passive ROM    Myofascial Release  STM to R low lumbar, R glute and hip with roller and ball    Passive ROM  PROM R hip, Long leg distraction on R 10 sec x5;                PT Short Term Goals - 09/07/17 1048      PT SHORT TERM GOAL #1   Title  Pt to be independent with inital HEP for back    Time  2    Period  Weeks    Status  New    Target Date  09/21/17        PT Long Term Goals - 09/07/17 1049      PT LONG TERM GOAL #1   Title  Pt to report decreased pain to 0-2/10 in lumbar region     Time  6    Status  New    Target Date  10/19/17      PT LONG TERM GOAL #2   Title  Pt to demo improved Hip and core strength to at least 4/5 to improve stability and pain.     Time  6    Period  Weeks    Status  New    Target Date  10/19/17      PT LONG TERM GOAL #3   Title  Pt to be independent with final HEP    Time  6    Period  Weeks    Status  New    Target Date  10/19/17            Plan - 09/16/17 0942    Clinical Impression  Statement  Pt with no increased pain during activities today. Pt requires cueing for core stabilization. She will benefit from progressive strengthening for hips and core. Noted tenderness in several spots in R hip, glute and low back with manual today.     Rehab Potential  Good    PT Frequency  2x / week    PT Duration  6 weeks    PT Treatment/Interventions  ADLs/Self Care Home Management;Cryotherapy;Electrical Stimulation;Iontophoresis 4mg /ml Dexamethasone;Moist Heat;Therapeutic activities;Functional mobility  training;Stair training;Gait training;DME Instruction;Ultrasound;Therapeutic exercise;Balance training;Neuromuscular re-education;Patient/family education;Orthotic Fit/Training;Dry needling;Passive range of motion;Manual techniques;Taping    Consulted and Agree with Plan of Care  Patient       Patient will benefit from skilled therapeutic intervention in order to improve the following deficits and impairments:  Abnormal gait, Decreased endurance, Decreased activity tolerance, Decreased strength, Pain, Increased muscle spasms, Difficulty walking, Decreased mobility, Decreased range of motion, Improper body mechanics  Visit Diagnosis: Chronic bilateral low back pain with right-sided sciatica     Problem List Patient Active Problem List   Diagnosis Date Noted  . PVC's (premature ventricular contractions) 04/16/2017  . Hyperparathyroidism, secondary ([place]) 07/06/2016  . Right renal atrophy 04/19/2016  . COPD (chronic obstructive pulmonary disease) ([place]) 04/19/2016  . Aortic atherosclerosis ([place]) 04/19/2016  . Atherosclerosis of arteries 04/19/2016  . Sleep apnea, not on CPAP   . Diverticulosis   . Coronary artery disease involving native coronary artery of native heart without angina pectoris 03/10/2016  . Rib pain - s/p CPR 10/08/2015  . History of cardiac arrest 10/01/2015  . History of ST elevation myocardial infarction (STEMI) 10/01/2015  . Second hand smoke exposure 07/28/2015  . Postoperative malabsorption 07/27/2015  . Recurrent UTI 03/14/2014  . GERD (gastroesophageal reflux disease) 02/14/2014  . History of gastric bypass 02/14/2014  . Former smoker 02/14/2014  . Diabetes mellitus type II, controlled ([place]) 02/14/2014  . Anemia, B12 deficiency 04/04/2009  . Osteoporosis 12/14/2006  . Hyperlipidemia associated with type 2 diabetes mellitus ([place]) 09/22/2006  . Hypertension associated with diabetes ([place]) 09/22/2006  . Osteoarthritis 09/22/2006  . Degenerative joint disease  (DJD) of lumbar spine 09/22/2006  . Adenomatous polyp of colon 02/04/2004    Lyndee Hensen, PT, DPT 9:48 AM  09/16/17    [facility], [place], [address] Phone: [phone]   Fax:  [phone]  Name: Tricia [name] MRN: [id number] Date of Birth: 06/14/1945

## 2017-09-20 NOTE — Progress Notes (Signed)
Tricia [name] is a 72 y.o. female here for an acute visit.  History of Present Illness:   Flank Pain  This is a new problem. The current episode started in the past 7 days. The problem occurs intermittently. The problem has been waxing and waning since onset. The quality of the pain is described as aching. The pain does not radiate. The pain is mild. The pain is the same all the time. The symptoms are aggravated by bending and lying down. Associated symptoms include a fever. Pertinent negatives include no abdominal pain, bladder incontinence, bowel incontinence or dysuria. She has tried nothing for the symptoms.   PMHx, SurgHx, SocialHx, Medications, and Allergies were reviewed in the Visit Navigator and updated as appropriate.  Current Medications:   .  albuterol (PROVENTIL HFA;VENTOLIN HFA) 108 (90 Base) MCG/ACT inhaler, Inhale 2 puffs into the lungs every 6 (six) hours as needed for wheezing or shortness of breath., Disp: 1 Inhaler, Rfl: 3 .  amLODipine (NORVASC) 5 MG tablet, Take 1 tablet (5 mg total) by mouth daily., Disp: 90 tablet, Rfl: 3 .  aspirin EC 81 MG tablet, Take 81 mg by mouth daily., Disp: , Rfl:  .  atorvastatin (LIPITOR) 80 MG tablet, Take 1 tablet (80 mg total) by mouth daily., Disp: 90 tablet, Rfl: 2 .  baclofen (LIORESAL) 10 MG tablet, Take 1 tablet (10 mg total) by mouth 2 (two) times daily., Disp: 60 each, Rfl: 2 .  Biotin 10000 MCG TABS, Take 10,000 mcg by mouth daily. Dissolvable tablets, Disp: , Rfl:  .  blood glucose meter kit and supplies KIT, Dispense based on patient and insurance preference. Use to test daily. (FOR ICD-9 250.00, 250.01). 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  carvedilol (COREG) 3.125 MG tablet, Pt to take 3.125 mg with the carvedilol 6.25 mg  for a total of 9.375  Twice a day., Disp: 180 tablet, Rfl: 3 .  carvedilol (COREG) 6.25 MG tablet, Pt to take carvedilol 6.25 mg with the carvedilol 3.125 mg for a total of 9.375 mg twice da day.,  Disp: 180 tablet, Rfl: 3 .  clopidogrel (PLAVIX) 75 MG tablet, Take 1 tablet (75 mg total) by mouth daily., Disp: 90 tablet, Rfl: 2 .  denosumab (PROLIA) 60 MG/ML SOSY injection, Inject 60 mg into the skin every 6 (six) months., Disp: , Rfl:  .  furosemide (LASIX) 20 MG tablet, Take 1 tablet (20 mg total) by mouth daily., Disp: 30 tablet, Rfl: 3 .  gabapentin (NEURONTIN) 100 MG capsule, TAKE 1 CAPSULE BY MOUTH THREE TIMES A DAY, Disp: 270 capsule, Rfl: 1 .  glucose blood (FREESTYLE LITE) test strip, TEST UP TO FOUR TIMES DAILY Dx: E11.22, Disp: 360 each, Rfl: 5 .  HYDROcodone-acetaminophen (NORCO) 10-325 MG tablet, Take 1 tablet by mouth every 8 (eight) hours as needed., Disp: 30 tablet, Rfl: 0 .  Lancets (FREESTYLE) lancets, TEST UP TO FOUR TIMES DAILY Dx: E11.22, Disp: 360 each, Rfl: 5 .  nitroGLYCERIN (NITROLINGUAL) 0.4 MG/SPRAY spray, Place 1 spray under the tongue every 5 (five) minutes x 3 doses as needed for chest pain., Disp: 4.9 g, Rfl: 3 .  pantoprazole (PROTONIX) 40 MG tablet, Take 1 tablet (40 mg total) by mouth daily., Disp: 90 tablet, Rfl: 2 .  potassium chloride (K-DUR) 10 MEQ tablet, Take 1 tablet (10 mEq total) by mouth 2 (two) times daily., Disp: 60 tablet, Rfl: 0 .  valsartan (DIOVAN) 320 MG tablet, Take 1 tablet (320 mg  total) by mouth daily., Disp: 30 tablet, Rfl: 3 .  cephALEXin (KEFLEX) 500 MG capsule, Take 1 capsule (500 mg total) by mouth 3 (three) times daily for 5 days., Disp: 15 capsule, Rfl: 0   Allergies  Allergen Reactions  . Sulfa Antibiotics Diarrhea and Nausea And Vomiting   Review of Systems:   Pertinent items are noted in the HPI. Otherwise, ROS is negative.  Vitals:   Vitals:   09/21/17 0708  BP: 128/76  Pulse: 66  Temp: 98.6 F (37 C)  TempSrc: Oral  SpO2: 96%  Weight: 222 lb 9.6 oz (101 kg)  Height: '5\' 2"'  (1.575 m)     Body mass index is 40.71 kg/m.  Physical Exam:   Physical Exam  Constitutional: She appears well-nourished.  HENT:    Head: Normocephalic and atraumatic.  Eyes: Pupils are equal, round, and reactive to light. EOM are normal.  Neck: Normal range of motion. Neck supple.  Cardiovascular: Normal rate, regular rhythm, normal heart sounds and intact distal pulses.  Pulmonary/Chest: Effort normal.  Abdominal: Soft.  Skin: Skin is warm.  Psychiatric: She has a normal mood and affect. Her behavior is normal.  Nursing note and vitals reviewed.  Results for orders placed or performed in visit on 09/21/17  POCT Urinalysis Dipstick (Automated)  Result Value Ref Range   Color, UA yellow    Clarity, UA clear    Glucose, UA Negative Negative   Bilirubin, UA neg    Ketones, UA neg    Spec Grav, UA 1.025 1.010 - 1.025   Blood, UA neg    pH, UA 5.5 5.0 - 8.0   Protein, UA Negative Negative   Urobilinogen, UA 0.2 0.2 or 1.0 E.U./dL   Nitrite, UA pos    Leukocytes, UA Negative Negative   Assessment and Plan:   Diagnoses and all orders for this visit:  Dysuria Comments: Concern for pyelonephritis so will treat as below. UCx pending. Orders: -     POCT Urinalysis Dipstick (Automated) -     cephALEXin (KEFLEX) 500 MG capsule; Take 1 capsule (500 mg total) by mouth 3 (three) times daily for 5 days. -     Urine Culture -     cefTRIAXone (ROCEPHIN) injection 1 g  Left flank pain Comments: As above.  Controlled type 2 diabetes mellitus with complication, without long-term current use of insulin ([place]) Comments: Stay hydrated. Continue current medications.    . Reviewed expectations re: course of current medical issues. . Discussed self-management of symptoms. . Outlined signs and symptoms indicating need for more acute intervention. . Patient verbalized understanding and all questions were answered. Marland Kitchen Health Maintenance issues including appropriate healthy diet, exercise, and smoking avoidance were discussed with patient. . See orders for this visit as documented in the electronic medical  record. . Patient received an After Visit Summary.  Briscoe Deutscher, DO [place] 09/21/2017

## 2017-09-21 ENCOUNTER — Encounter: Payer: Self-pay | Admitting: Physical Therapy

## 2017-09-21 ENCOUNTER — Encounter: Payer: Self-pay | Admitting: Family Medicine

## 2017-09-21 ENCOUNTER — Ambulatory Visit: Payer: PPO | Admitting: Physical Therapy

## 2017-09-21 ENCOUNTER — Ambulatory Visit (INDEPENDENT_AMBULATORY_CARE_PROVIDER_SITE_OTHER): Payer: PPO | Admitting: Family Medicine

## 2017-09-21 VITALS — BP 128/76 | HR 66 | Temp 98.6°F | Ht 62.0 in | Wt 222.6 lb

## 2017-09-21 DIAGNOSIS — E118 Type 2 diabetes mellitus with unspecified complications: Secondary | ICD-10-CM

## 2017-09-21 DIAGNOSIS — M5441 Lumbago with sciatica, right side: Secondary | ICD-10-CM | POA: Diagnosis not present

## 2017-09-21 DIAGNOSIS — R109 Unspecified abdominal pain: Secondary | ICD-10-CM | POA: Diagnosis not present

## 2017-09-21 DIAGNOSIS — G8929 Other chronic pain: Secondary | ICD-10-CM

## 2017-09-21 DIAGNOSIS — R300 Dysuria: Secondary | ICD-10-CM | POA: Diagnosis not present

## 2017-09-21 LAB — POC URINALSYSI DIPSTICK (AUTOMATED)
Bilirubin, UA: NEGATIVE
Blood, UA: NEGATIVE
Glucose, UA: NEGATIVE
Ketones, UA: NEGATIVE
Leukocytes, UA: NEGATIVE
Nitrite, UA: POSITIVE
Protein, UA: NEGATIVE
Spec Grav, UA: 1.025 (ref 1.010–1.025)
Urobilinogen, UA: 0.2 E.U./dL
pH, UA: 5.5 (ref 5.0–8.0)

## 2017-09-21 MED ORDER — CEFTRIAXONE SODIUM 1 G IJ SOLR
1.0000 g | Freq: Once | INTRAMUSCULAR | Status: AC
  Administered 2017-09-21: 1 g via INTRAMUSCULAR

## 2017-09-21 MED ORDER — CEPHALEXIN 500 MG PO CAPS: 500.0000 mg | ORAL_CAPSULE | Freq: Three times a day (TID) | ORAL | 0 refills | Status: AC

## 2017-09-21 NOTE — Therapy (Signed)
[facility] [address] [place], [place], [address] Phone: [phone]   Fax:  [phone]  Physical Therapy Treatment  Patient Details  Name: Tricia [name] MRN: [id number] Date of Birth: [birth year] Referring Provider: Briscoe Deutscher   Encounter Date: 09/21/2017  PT End of Session - 09/21/17 1012    Visit Number  5    Number of Visits  12    Date for PT Re-Evaluation  10/19/17    Authorization Type  HTA    PT Start Time  0800    PT Stop Time  0843    PT Time Calculation (min)  43 min    Activity Tolerance  Patient tolerated treatment well    Behavior During Therapy  [facility] for tasks assessed/performed       Past Medical History:  Diagnosis Date  . Adenomatous polyp of colon 02/2004  . Anemia   . Anxiety   . Arthritis   . B12 deficiency   . CAD (coronary artery disease)    a. s/p remote BMS to LAD;  b. 09/2015 Inf STEMI/VF Arrest: LM nl, LAD 85p (staged PCI 2 days later w/ 3.0x32 Synergy DES), 40p/m, 25d, LCX 37m, RCA 80ost/100p (2.75x32 Synergy DES), 62m, EF 55-65%. // c. Myoview 2/18: EF 59, no ischemia or infarction; Normal study  . Dermatophytosis of groin and perianal area   . Diet Controlled Diabetes Mellitus   . Diverticulosis   . H/O echocardiogram    a. 09/2015 Echo: EF 60-65%, no rwma, mild AI, mildly dil RA, mild to mod TR.  Marland Kitchen Hyperlipidemia   . Hypertensive heart disease   . Low back pain    l5 disc  . Morbid obesity ([place])   . Osteoporosis   . PVC's (premature ventricular contractions) 04/16/2017   Holter 3/19: NSR, average heart rate 69, frequent PVCs (5% total beats), rare supraventricular ectopics, no AF/flutter  . Sleep apnea 10/03/2009   Resolved after gastric bypass    . TOBACCO ABUSE 10/05/2009   Qualifier: Diagnosis of  By: Stanford Breed, MD, Kandyce Rud   . Ventricular fibrillation ([place]) 10/01/2015   a. In setting of inferior STEMI.    Past Surgical History:  Procedure Laterality Date  . ABDOMINAL  HYSTERECTOMY    . APPENDECTOMY    . BARIATRIC SURGERY    . CARDIAC CATHETERIZATION N/A 10/01/2015   Procedure: Left Heart Cath and Coronary Angiography;  Surgeon: Leonie Man, MD;  Location: [facility];  Service: Cardiovascular;  Laterality: N/A;  . CARDIAC CATHETERIZATION N/A 10/01/2015   Procedure: Coronary Stent Intervention;  Surgeon: Leonie Man, MD;  Location: [facility];  Service: Cardiovascular;  Laterality: N/A;  . CARDIAC CATHETERIZATION N/A 10/03/2015   Procedure: Coronary Stent Intervention;  Surgeon: Troy Sine, MD;  Location: [facility];  Service: Cardiovascular;  Laterality: N/A;  . CARDIAC CATHETERIZATION N/A 10/03/2015   Procedure: Coronary/Graft Angiography;  Surgeon: Troy Sine, MD;  Location: [facility];  Service: Cardiovascular;  Laterality: N/A;  . CARPAL TUNNEL RELEASE Bilateral   . CERVICAL DISC SURGERY    . CHOLECYSTECTOMY    . CORONARY ANGIOPLASTY  2002  . HERNIA REPAIR    . LUMBAR LAMINECTOMY     L3-4  . NEPHRECTOMY     Partial  . TONSILLECTOMY    . TOTAL KNEE ARTHROPLASTY Left   . TUBAL LIGATION    . ULNAR NERVE REPAIR      There were no  vitals filed for this visit.  Subjective Assessment - 09/21/17 1009    Subjective  Pt states she saw PCP this am, she has a UTI, was given anti-biotics. She had increased pain in R side of low back (upper lumbar region) over the weekend, she thinks it may be due to this.     Currently in Pain?  Yes    Pain Score  4     Pain Location  Back    Pain Orientation  Right;Left    Pain Descriptors / Indicators  Aching    Pain Type  Chronic pain    Pain Onset  More than a month ago    Pain Frequency  Intermittent    Aggravating Factors   Standing, walking     Pain Relieving Factors  sitting                       [facility] Adult PT Treatment/Exercise - 09/21/17 0811      Exercises   Exercises  Lumbar      Lumbar Exercises: Stretches   Active Hamstring Stretch  3  reps;30 seconds    Single Knee to Chest Stretch  3 reps;30 seconds    Lower Trunk Rotation  10 seconds;5 reps    Pelvic Tilt  20 reps      Lumbar Exercises: Aerobic   Stationary Bike  L1 x 8 min      Lumbar Exercises: Standing   Other Standing Lumbar Exercises  Rows RTB x20;     Other Standing Lumbar Exercises  Standing hip abd x15 bil;       Lumbar Exercises: Supine   Ab Set  10 reps;5 seconds    Clam  20 reps    Clam Limitations  GTB    Bent Knee Raise  20 reps    Bent Knee Raise Limitations  with TA    Other Supine Lumbar Exercises  Hip Add Iso with TA x20;       Manual Therapy   Manual Therapy  --    Myofascial Release  --    Passive ROM  --               PT Short Term Goals - 09/21/17 1013      PT SHORT TERM GOAL #1   Title  Pt to be independent with inital HEP for back    Time  2    Period  Weeks    Status  Achieved        PT Long Term Goals - 09/07/17 1049      PT LONG TERM GOAL #1   Title  Pt to report decreased pain to 0-2/10 in lumbar region     Time  6    Status  New    Target Date  10/19/17      PT LONG TERM GOAL #2   Title  Pt to demo improved Hip and core strength to at least 4/5 to improve stability and pain.     Time  6    Period  Weeks    Status  New    Target Date  10/19/17      PT LONG TERM GOAL #3   Title  Pt to be independent with final HEP    Time  6    Period  Weeks    Status  New    Target Date  10/19/17  Plan - 09/21/17 1013    Clinical Impression Statement  Pt with increased soreness in upper/lateral R lumbar region today, last visit was lower/SI region. Pt will have follow up with PCP if pain does not improve with anti-biotic, back pain may be increased from kidney. No manual done today, will continue to assess pain this week. Light ther ex done today, pt encouraged to continue light stretching and core strengthening for HEP.     Rehab Potential  Good    PT Frequency  2x / week    PT Duration  6 weeks     PT Treatment/Interventions  ADLs/Self Care Home Management;Cryotherapy;Electrical Stimulation;Iontophoresis 4mg /ml Dexamethasone;Moist Heat;Therapeutic activities;Functional mobility training;Stair training;Gait training;DME Instruction;Ultrasound;Therapeutic exercise;Balance training;Neuromuscular re-education;Patient/family education;Orthotic Fit/Training;Dry needling;Passive range of motion;Manual techniques;Taping    Consulted and Agree with Plan of Care  Patient       Patient will benefit from skilled therapeutic intervention in order to improve the following deficits and impairments:  Abnormal gait, Decreased endurance, Decreased activity tolerance, Decreased strength, Pain, Increased muscle spasms, Difficulty walking, Decreased mobility, Decreased range of motion, Improper body mechanics  Visit Diagnosis: Chronic bilateral low back pain with right-sided sciatica     Problem List Patient Active Problem List   Diagnosis Date Noted  . PVC's (premature ventricular contractions) 04/16/2017  . Hyperparathyroidism, secondary ([place]) 07/06/2016  . Right renal atrophy 04/19/2016  . COPD (chronic obstructive pulmonary disease) ([place]) 04/19/2016  . Aortic atherosclerosis ([place]) 04/19/2016  . Atherosclerosis of arteries 04/19/2016  . Sleep apnea, not on CPAP   . Diverticulosis   . Coronary artery disease involving native coronary artery of native heart without angina pectoris 03/10/2016  . Rib pain - s/p CPR 10/08/2015  . History of cardiac arrest 10/01/2015  . History of ST elevation myocardial infarction (STEMI) 10/01/2015  . Second hand smoke exposure 07/28/2015  . Postoperative malabsorption 07/27/2015  . Recurrent UTI 03/14/2014  . GERD (gastroesophageal reflux disease) 02/14/2014  . History of gastric bypass 02/14/2014  . Former smoker 02/14/2014  . Diabetes mellitus type II, controlled ([place]) 02/14/2014  . Anemia, B12 deficiency 04/04/2009  . Osteoporosis 12/14/2006  .  Hyperlipidemia associated with type 2 diabetes mellitus ([place]) 09/22/2006  . Hypertension associated with diabetes ([place]) 09/22/2006  . Osteoarthritis 09/22/2006  . 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 Adenomatous polyp of colon 02/04/2004    Lyndee Hensen, PT, DPT 10:15 AM  09/21/17    [facility] [place], [place], [address] Phone: [phone]   Fax:  [phone]  Name: ROSELLE NORTON MRN: [id number] Date of Birth: 01-Mar-1945

## 2017-09-23 ENCOUNTER — Encounter: Payer: Self-pay | Admitting: Physical Therapy

## 2017-09-23 ENCOUNTER — Ambulatory Visit: Payer: PPO | Admitting: Physical Therapy

## 2017-09-23 DIAGNOSIS — G8929 Other chronic pain: Secondary | ICD-10-CM

## 2017-09-23 DIAGNOSIS — M5441 Lumbago with sciatica, right side: Secondary | ICD-10-CM

## 2017-09-23 LAB — URINE CULTURE
MICRO NUMBER:: 90984230
SPECIMEN QUALITY:: ADEQUATE

## 2017-09-23 NOTE — Therapy (Signed)
[facility] [address] [place], [place], [address] Phone: [phone]   Fax:  [phone]  Physical Therapy Treatment  Patient Details  Name: Tricia [name] MRN: [id number] Date of Birth: 1945/02/27 Referring Provider: Briscoe Deutscher   Encounter Date: 09/23/2017  PT End of Session - 09/23/17 0857    Visit Number  6    Number of Visits  12    Date for PT Re-Evaluation  10/19/17    Authorization Type  HTA    PT Start Time  0846    PT Stop Time  0925    PT Time Calculation (min)  39 min    Activity Tolerance  Patient tolerated treatment well    Behavior During Therapy  [facility] for tasks assessed/performed       Past Medical History:  Diagnosis Date  . Adenomatous polyp of colon 02/2004  . Anemia   . Anxiety   . Arthritis   . B12 deficiency   . CAD (coronary artery disease)    a. s/p remote BMS to LAD;  b. 09/2015 Inf STEMI/VF Arrest: LM nl, LAD 85p (staged PCI 2 days later w/ 3.0x32 Synergy DES), 40p/m, 25d, LCX 80m, RCA 80ost/100p (2.75x32 Synergy DES), 6m, EF 55-65%. // c. Myoview 2/18: EF 59, no ischemia or infarction; Normal study  . Dermatophytosis of groin and perianal area   . Diet Controlled Diabetes Mellitus   . Diverticulosis   . H/O echocardiogram    a. 09/2015 Echo: EF 60-65%, no rwma, mild AI, mildly dil RA, mild to mod TR.  Marland Kitchen Hyperlipidemia   . Hypertensive heart disease   . Low back pain    l5 disc  . Morbid obesity ([place])   . Osteoporosis   . PVC's (premature ventricular contractions) 04/16/2017   Holter 3/19: NSR, average heart rate 69, frequent PVCs (5% total beats), rare supraventricular ectopics, no AF/flutter  . Sleep apnea 10/03/2009   Resolved after gastric bypass    . TOBACCO ABUSE 10/05/2009   Qualifier: Diagnosis of  By: Stanford Breed, MD, Kandyce Rud   . Ventricular fibrillation ([place]) 10/01/2015   a. In setting of inferior STEMI.    Past Surgical History:  Procedure Laterality Date  . ABDOMINAL  HYSTERECTOMY    . APPENDECTOMY    . BARIATRIC SURGERY    . CARDIAC CATHETERIZATION N/A 10/01/2015   Procedure: Left Heart Cath and Coronary Angiography;  Surgeon: Leonie Man, MD;  Location: [facility];  Service: Cardiovascular;  Laterality: N/A;  . CARDIAC CATHETERIZATION N/A 10/01/2015   Procedure: Coronary Stent Intervention;  Surgeon: Leonie Man, MD;  Location: [facility];  Service: Cardiovascular;  Laterality: N/A;  . CARDIAC CATHETERIZATION N/A 10/03/2015   Procedure: Coronary Stent Intervention;  Surgeon: Troy Sine, MD;  Location: [facility];  Service: Cardiovascular;  Laterality: N/A;  . CARDIAC CATHETERIZATION N/A 10/03/2015   Procedure: Coronary/Graft Angiography;  Surgeon: Troy Sine, MD;  Location: [facility];  Service: Cardiovascular;  Laterality: N/A;  . CARPAL TUNNEL RELEASE Bilateral   . CERVICAL DISC SURGERY    . CHOLECYSTECTOMY    . CORONARY ANGIOPLASTY  2002  . HERNIA REPAIR    . LUMBAR LAMINECTOMY     L3-4  . NEPHRECTOMY     Partial  . TONSILLECTOMY    . TOTAL KNEE ARTHROPLASTY Left   . TUBAL LIGATION    . ULNAR NERVE REPAIR      There were no  vitals filed for this visit.  Subjective Assessment - 09/23/17 0853    Subjective  Pt states back pain has significantly improved since taking the anti-biotic. She states minimal/no pain on R side.     Currently in Pain?  No/denies    Pain Score  0-No pain                       [facility] Adult PT Treatment/Exercise - 09/23/17 0857      Exercises   Exercises  Lumbar      Lumbar Exercises: Stretches   Active Hamstring Stretch  3 reps;30 seconds    Single Knee to Chest Stretch  3 reps;30 seconds    Lower Trunk Rotation  10 seconds;5 reps    Pelvic Tilt  20 reps      Lumbar Exercises: Aerobic   Stationary Bike  L1 x 5 min      Lumbar Exercises: Standing   Other Standing Lumbar Exercises  --    Other Standing Lumbar Exercises  Standing hip abd 2 x10 bil;        Lumbar Exercises: Supine   Ab Set  --    Clam  20 reps    Clam Limitations  GTB    Bent Knee Raise  20 reps    Bent Knee Raise Limitations  with TA    Straight Leg Raise  5 reps    Straight Leg Raises Limitations  2 sets of 5 bil;     Other Supine Lumbar Exercises  Hip Add Iso with TA x20;              PT Education - 09/23/17 0940    Education Details  HEP    Person(s) Educated  Patient    Methods  Explanation;Handout;Verbal cues    Comprehension  Verbalized understanding;Need further instruction       PT Short Term Goals - 09/21/17 1013      PT SHORT TERM GOAL #1   Title  Pt to be independent with inital HEP for back    Time  2    Period  Weeks    Status  Achieved        PT Long Term Goals - 09/07/17 1049      PT LONG TERM GOAL #1   Title  Pt to report decreased pain to 0-2/10 in lumbar region     Time  6    Status  New    Target Date  10/19/17      PT LONG TERM GOAL #2   Title  Pt to demo improved Hip and core strength to at least 4/5 to improve stability and pain.     Time  6    Period  Weeks    Status  New    Target Date  10/19/17      PT LONG TERM GOAL #3   Title  Pt to be independent with final HEP    Time  6    Period  Weeks    Status  New    Target Date  10/19/17            Plan - 09/23/17 0942    Clinical Impression Statement  Pt with improved pain in R lumbar region, since taking anti-biotic. Pt encouraged to continue for at least one more visit, to ensure that pain is gone, and to review mechanics for HEP. Pt to benefit from continued strengthening for core and hips.  Rehab Potential  Good    PT Frequency  2x / week    PT Duration  6 weeks    PT Treatment/Interventions  ADLs/Self Care Home Management;Cryotherapy;Electrical Stimulation;Iontophoresis 4mg /ml Dexamethasone;Moist Heat;Therapeutic activities;Functional mobility training;Stair training;Gait training;DME Instruction;Ultrasound;Therapeutic exercise;Balance  training;Neuromuscular re-education;Patient/family education;Orthotic Fit/Training;Dry needling;Passive range of motion;Manual techniques;Taping    PT Next Visit Plan  review final HEP (issued 8/21) and possible d/c.     Consulted and Agree with Plan of Care  Patient       Patient will benefit from skilled therapeutic intervention in order to improve the following deficits and impairments:  Abnormal gait, Decreased endurance, Decreased activity tolerance, Decreased strength, Pain, Increased muscle spasms, Difficulty walking, Decreased mobility, Decreased range of motion, Improper body mechanics  Visit Diagnosis: Chronic low back pain with right-sided sciatica, unspecified back pain laterality     Problem List Patient Active Problem List   Diagnosis Date Noted  . PVC's (premature ventricular contractions) 04/16/2017  . Hyperparathyroidism, secondary ([place]) 07/06/2016  . Right renal atrophy 04/19/2016  . COPD (chronic obstructive pulmonary disease) ([place]) 04/19/2016  . Aortic atherosclerosis ([place]) 04/19/2016  . Atherosclerosis of arteries 04/19/2016  . Sleep apnea, not on CPAP   . Diverticulosis   . Coronary artery disease involving native coronary artery of native heart without angina pectoris 03/10/2016  . Rib pain - s/p CPR 10/08/2015  . History of cardiac arrest 10/01/2015  . History of ST elevation myocardial infarction (STEMI) 10/01/2015  . Second hand smoke exposure 07/28/2015  . Postoperative malabsorption 07/27/2015  . Recurrent UTI 03/14/2014  . GERD (gastroesophageal reflux disease) 02/14/2014  . History of gastric bypass 02/14/2014  . Former smoker 02/14/2014  . Diabetes mellitus type II, controlled ([place]) 02/14/2014  . Anemia, B12 deficiency 04/04/2009  . Osteoporosis 12/14/2006  . Hyperlipidemia associated with type 2 diabetes mellitus ([place]) 09/22/2006  . Hypertension associated with diabetes ([place]) 09/22/2006  . Osteoarthritis 09/22/2006  . Degenerative joint disease  (DJD) of lumbar spine 09/22/2006  . Adenomatous polyp of colon 02/04/2004    Lyndee Hensen, PT, DPT 9:44 AM  09/23/17    [facility] [place], [place], [address] Phone: [phone]   Fax:  [phone]  Name: GEROLDINE ESQUIVIAS MRN: [id number] Date of Birth: 1945-03-31

## 2017-09-24 ENCOUNTER — Other Ambulatory Visit: Payer: Self-pay | Admitting: Family Medicine

## 2017-09-24 DIAGNOSIS — R600 Localized edema: Secondary | ICD-10-CM

## 2017-09-26 ENCOUNTER — Other Ambulatory Visit: Payer: Self-pay | Admitting: Family Medicine

## 2017-09-26 DIAGNOSIS — M159 Polyosteoarthritis, unspecified: Secondary | ICD-10-CM

## 2017-09-26 DIAGNOSIS — M150 Primary generalized (osteo)arthritis: Principal | ICD-10-CM

## 2017-09-26 DIAGNOSIS — M8949 Other hypertrophic osteoarthropathy, multiple sites: Secondary | ICD-10-CM

## 2017-09-27 ENCOUNTER — Other Ambulatory Visit: Payer: Self-pay | Admitting: Family Medicine

## 2017-09-27 DIAGNOSIS — R600 Localized edema: Secondary | ICD-10-CM

## 2017-09-28 ENCOUNTER — Encounter: Payer: PPO | Admitting: Physical Therapy

## 2017-09-28 MED ORDER — HYDROCODONE-ACETAMINOPHEN 10-325 MG PO TABS: 1.0000 | ORAL_TABLET | Freq: Three times a day (TID) | ORAL | 0 refills | Status: DC | PRN

## 2017-09-28 NOTE — Telephone Encounter (Signed)
Please advise on 90 day refill.

## 2017-09-28 NOTE — Telephone Encounter (Signed)
Please advise on refill.

## 2017-09-29 ENCOUNTER — Encounter: Payer: Self-pay | Admitting: Physical Therapy

## 2017-09-29 ENCOUNTER — Other Ambulatory Visit: Payer: Self-pay | Admitting: Family Medicine

## 2017-09-29 ENCOUNTER — Ambulatory Visit: Payer: PPO | Admitting: Physical Therapy

## 2017-09-29 ENCOUNTER — Other Ambulatory Visit (INDEPENDENT_AMBULATORY_CARE_PROVIDER_SITE_OTHER): Payer: PPO

## 2017-09-29 DIAGNOSIS — M5441 Lumbago with sciatica, right side: Secondary | ICD-10-CM

## 2017-09-29 DIAGNOSIS — G8929 Other chronic pain: Secondary | ICD-10-CM | POA: Diagnosis not present

## 2017-09-29 DIAGNOSIS — R300 Dysuria: Secondary | ICD-10-CM

## 2017-09-29 LAB — POCT URINALYSIS DIPSTICK
Blood, UA: NEGATIVE
Glucose, UA: NEGATIVE
Ketones, UA: NEGATIVE
Leukocytes, UA: NEGATIVE
Nitrite, UA: NEGATIVE
Protein, UA: NEGATIVE
Spec Grav, UA: 1.02 (ref 1.010–1.025)
Urobilinogen, UA: 0.2 E.U./dL
pH, UA: 5 (ref 5.0–8.0)

## 2017-09-29 NOTE — Addendum Note (Signed)
Addended by: Durwin Glaze on: 09/29/2017 10:49 AM   Modules accepted: Orders

## 2017-09-29 NOTE — Therapy (Addendum)
[facility] [address] [place], [place], [address] Phone: [phone]   Fax:  [phone]  Physical Therapy Treatment  Patient Details  Name: Tricia [name] MRN: [id number] Date of Birth: [birth year] Referring Provider: Briscoe Deutscher   Encounter Date: 09/29/2017  PT End of Session - 09/29/17 0853    Visit Number  7    Number of Visits  12    Date for PT Re-Evaluation  10/19/17    Authorization Type  HTA    PT Start Time  0850    PT Stop Time  0933    PT Time Calculation (min)  43 min    Activity Tolerance  Patient tolerated treatment well    Behavior During Therapy  [facility] for tasks assessed/performed       Past Medical History:  Diagnosis Date  . Adenomatous polyp of colon 02/2004  . Anemia   . Anxiety   . Arthritis   . B12 deficiency   . CAD (coronary artery disease)    a. s/p remote BMS to LAD;  b. 09/2015 Inf STEMI/VF Arrest: LM nl, LAD 85p (staged PCI 2 days later w/ 3.0x32 Synergy DES), 40p/m, 25d, LCX 54m RCA 80ost/100p (2.75x32 Synergy DES), 631mEF 55-65%. // c. Myoview 2/18: EF 59, no ischemia or infarction; Normal study  . Dermatophytosis of groin and perianal area   . Diet Controlled Diabetes Mellitus   . Diverticulosis   . H/O echocardiogram    a. 09/2015 Echo: EF 60-65%, no rwma, mild AI, mildly dil RA, mild to mod TR.  . Marland Kitchenyperlipidemia   . Hypertensive heart disease   . Low back pain    l5 disc  . Morbid obesity ([place]  . Osteoporosis   . PVC's (premature ventricular contractions) 04/16/2017   Holter 3/19: NSR, average heart rate 69, frequent PVCs (5% total beats), rare supraventricular ectopics, no AF/flutter  . Sleep apnea 10/03/2009   Resolved after gastric bypass    . TOBACCO ABUSE 10/05/2009   Qualifier: Diagnosis of  By: CrStanford BreedMD, FAKandyce Rud . Ventricular fibrillation (HCR[place]view08/28/2017   a. In setting of inferior STEMI.    Past Surgical History:  Procedure Laterality Date  . ABDOMINAL  HYSTERECTOMY    . APPENDECTOMY    . BARIATRIC SURGERY    . CARDIAC CATHETERIZATION N/A 10/01/2015   Procedure: Left Heart Cath and Coronary Angiography;  Surgeon: DaLeonie ManMD;  [facility];  Service: Cardiovascular;  Laterality: N/A;  . CARDIAC CATHETERIZATION N/A 10/01/2015   Procedure: Coronary Stent Intervention;  Surgeon: DaLeonie ManMD;  [facility];  Service: Cardiovascular;  Laterality: N/A;  . CARDIAC CATHETERIZATION N/A 10/03/2015   Procedure: Coronary Stent Intervention;  Surgeon: ThTroy SineMD;  [facility];  Service: Cardiovascular;  Laterality: N/A;  . CARDIAC CATHETERIZATION N/A 10/03/2015   Procedure: Coronary/Graft Angiography;  Surgeon: ThTroy SineMD;  [facility]: [facility];  Service: Cardiovascular;  Laterality: N/A;  . CARPAL TUNNEL RELEASE Bilateral   . CERVICAL DISC SURGERY    . CHOLECYSTECTOMY    . CORONARY ANGIOPLASTY  2002  . HERNIA REPAIR    . LUMBAR LAMINECTOMY     L3-4  . NEPHRECTOMY     Partial  . TONSILLECTOMY    . TOTAL KNEE ARTHROPLASTY Left   . TUBAL LIGATION    . ULNAR NERVE REPAIR      There were no  vitals filed for this visit.  Subjective Assessment - 09/29/17 0851    Subjective  patient reporitng R sided back pain with standing and walking "I don't know if it's my back or kidney"    Limitations  Standing;Walking;House hold activities;Lifting    Diagnostic tests  Recent x-ray (january)     Patient Stated Goals  decreased pain, improve mobility.     Currently in Pain?  No/denies   7-8/10 with walking into building   Pain Location  Back    Pain Orientation  Right;Left;Lower         [facility] PT Assessment - 09/29/17 0001      Strength   Overall Strength Comments  B hips grossly 4-/5                   [facility] Adult PT Treatment/Exercise - 09/29/17 0855      Exercises   Exercises  Lumbar      Lumbar Exercises: Stretches   Active Hamstring Stretch  3  reps;30 seconds    Active Hamstring Stretch Limitations  bilateral      Lumbar Exercises: Aerobic   Stationary Bike  L1 x 5 min      Lumbar Exercises: Standing   Scapular Retraction  Both;10 reps    Row  Strengthening;Both;20 reps;Theraband    Theraband Level (Row)  Level 2 (Red)    Other Standing Lumbar Exercises  B side stepping - blue band at ankles x 15 feet each direction    Other Standing Lumbar Exercises  hip abduction - 2# - 2 x 10 bilateral; hip extension - 2# - 2 x 10      Lumbar Exercises: Seated   Sit to Stand  10 reps    Sit to Stand Limitations  UE support on thighs               PT Short Term Goals - 09/21/17 1013      PT SHORT TERM GOAL #1   Title  Pt to be independent with inital HEP for back    Time  2    Period  Weeks    Status  Achieved        PT Long Term Goals - 09/29/17 7544      PT LONG TERM GOAL #1   Title  Pt to report decreased pain to 0-2/10 in lumbar region     Time  6    Status  Not Met      PT LONG TERM GOAL #2   Title  Pt to demo improved Hip and core strength to at least 4/5 to improve stability and pain.     Time  6    Period  Weeks    Status  Partially Met      PT LONG TERM GOAL #3   Title  Pt to be independent with final HEP    Time  6    Period  Weeks    Status  Partially Met   patient reports limited compliance           Plan - 09/29/17 0854    Clinical Impression Statement  Patient with subjective reports of R sided low back pain with mobility - unsure of cause - LBP vs kidney pain. Patient deisring to be placed on hold until lab results are received - if clear will plan to call and reschedule additional appointments. At this time goals partially met - education on need for complaince with HEP for  full benefit and carryover. Patient to continue to benefit from skilled services to address deficits in strength and funcitonal mobility and pain.     Rehab Potential  Good    PT Frequency  2x / week    PT Duration   6 weeks    PT Treatment/Interventions  ADLs/Self Care Home Management;Cryotherapy;Electrical Stimulation;Iontophoresis 82m/ml Dexamethasone;Moist Heat;Therapeutic activities;Functional mobility training;Stair training;Gait training;DME Instruction;Ultrasound;Therapeutic exercise;Balance training;Neuromuscular re-education;Patient/family education;Orthotic Fit/Training;Dry needling;Passive range of motion;Manual techniques;Taping    PT Next Visit Plan  on hold - patient wanting to wait on lab results    Consulted and Agree with Plan of Care  Patient       Patient will benefit from skilled therapeutic intervention in order to improve the following deficits and impairments:  Abnormal gait, Decreased endurance, Decreased activity tolerance, Decreased strength, Pain, Increased muscle spasms, Difficulty walking, Decreased mobility, Decreased range of motion, Improper body mechanics  Visit Diagnosis: Chronic low back pain with right-sided sciatica, unspecified back pain laterality     Problem List Patient Active Problem List   Diagnosis Date Noted  . PVC's (premature ventricular contractions) 04/16/2017  . Hyperparathyroidism, secondary ([place] 07/06/2016  . Right renal atrophy 04/19/2016  . COPD (chronic obstructive pulmonary disease) (HDavid[place] 04/19/2016  . Aortic atherosclerosis (HFountain[place] 04/19/2016  . Atherosclerosis of arteries 04/19/2016  . Sleep apnea, not on CPAP   . Diverticulosis   . Coronary artery disease involving native coronary artery of native heart without angina pectoris 03/10/2016  . Rib pain - s/p CPR 10/08/2015  . History of cardiac arrest 10/01/2015  . History of ST elevation myocardial infarction (STEMI) 10/01/2015  . Second hand smoke exposure 07/28/2015  . Postoperative malabsorption 07/27/2015  . Recurrent UTI 03/14/2014  . GERD (gastroesophageal reflux disease) 02/14/2014  . History of gastric bypass 02/14/2014  . Former smoker 02/14/2014  . Diabetes mellitus type II,  controlled (HCash 02/14/2014  . Anemia, B12 deficiency 04/04/2009  . Osteoporosis 12/14/2006  . Hyperlipidemia associated with type 2 diabetes mellitus ([place] 09/22/2006  . Hypertension associated with diabetes ([place] 09/22/2006  . Osteoarthritis 09/22/2006  . 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 Adenomatous polyp of colon 02/04/2004   SLanney Gins PT, DPT 09/29/17 9:41 AM Pager: [phone]  [place] [address]Phone: [phone]  Fax:  [phone] Name: JKAGAN HIETPASMRN: [id number] of Birth: [birth year]   PHYSICAL THERAPY DISCHARGE SUMMARY  Visits from Start of Care: 7  Plan: Patient agrees to discharge.  Patient goals were met. Patient is being discharged due to not returning since the last visit.  ?????    LLyndee Hensen PT, DPT 10:59 AM  12/07/17

## 2017-09-30 ENCOUNTER — Encounter: Payer: PPO | Admitting: Physical Therapy

## 2017-09-30 ENCOUNTER — Telehealth: Payer: Self-pay | Admitting: Family Medicine

## 2017-09-30 NOTE — Telephone Encounter (Signed)
Copied from [place] [id number]. Topic: General - Other >> Sep 30, 2017 12:00 PM Cecelia Byars, NT wrote: Reason for CRM: Patient called and said Tricia [name] needs to [facility] reimbersment  request form for 07/09/17  Please  fax to   [phone]. She would like a call once this is done ,thanks

## 2017-09-30 NOTE — Telephone Encounter (Signed)
Please advise 

## 2017-10-01 ENCOUNTER — Encounter: Payer: Self-pay | Admitting: Family Medicine

## 2017-10-01 MED ORDER — CIPROFLOXACIN HCL 250 MG PO TABS: 250.0000 mg | ORAL_TABLET | Freq: Two times a day (BID) | ORAL | 0 refills | Status: DC

## 2017-10-01 NOTE — Addendum Note (Signed)
Addended by: Briscoe Deutscher R on: 10/01/2017 10:58 AM   Modules accepted: Orders

## 2017-10-02 ENCOUNTER — Encounter: Payer: Self-pay | Admitting: Family Medicine

## 2017-10-02 LAB — URINE CULTURE
MICRO NUMBER:: 91023359
SPECIMEN QUALITY:: ADEQUATE

## 2017-10-08 DIAGNOSIS — H2513 Age-related nuclear cataract, bilateral: Secondary | ICD-10-CM | POA: Diagnosis not present

## 2017-10-08 DIAGNOSIS — E119 Type 2 diabetes mellitus without complications: Secondary | ICD-10-CM | POA: Diagnosis not present

## 2017-10-08 LAB — HM DIABETES EYE EXAM

## 2017-10-13 ENCOUNTER — Encounter: Payer: Self-pay | Admitting: Surgical

## 2017-10-15 ENCOUNTER — Encounter: Payer: Self-pay | Admitting: Family Medicine

## 2017-10-16 ENCOUNTER — Other Ambulatory Visit (INDEPENDENT_AMBULATORY_CARE_PROVIDER_SITE_OTHER): Payer: PPO

## 2017-10-16 ENCOUNTER — Other Ambulatory Visit: Payer: Self-pay | Admitting: Surgical

## 2017-10-16 DIAGNOSIS — R300 Dysuria: Secondary | ICD-10-CM | POA: Diagnosis not present

## 2017-10-16 LAB — URINALYSIS, ROUTINE W REFLEX MICROSCOPIC
Bilirubin Urine: NEGATIVE
Hgb urine dipstick: NEGATIVE
Ketones, ur: NEGATIVE
Leukocytes, UA: NEGATIVE
Nitrite: NEGATIVE
RBC / HPF: NONE SEEN (ref 0–?)
Specific Gravity, Urine: 1.02 (ref 1.000–1.030)
Total Protein, Urine: NEGATIVE
Urine Glucose: NEGATIVE
Urobilinogen, UA: 0.2 (ref 0.0–1.0)
pH: 5.5 (ref 5.0–8.0)

## 2017-10-18 LAB — URINE CULTURE
MICRO NUMBER:: 91100392
SPECIMEN QUALITY:: ADEQUATE

## 2017-10-18 MED ORDER — CIPROFLOXACIN HCL 250 MG PO TABS: 250.0000 mg | ORAL_TABLET | Freq: Two times a day (BID) | ORAL | 0 refills | Status: DC

## 2017-10-18 NOTE — Progress Notes (Signed)
cipro

## 2017-10-18 NOTE — Addendum Note (Signed)
Addended by: Briscoe Deutscher R on: 10/18/2017 11:37 AM   Modules accepted: Orders

## 2017-10-20 ENCOUNTER — Other Ambulatory Visit: Payer: Self-pay | Admitting: Surgical

## 2017-10-20 DIAGNOSIS — R109 Unspecified abdominal pain: Secondary | ICD-10-CM

## 2017-10-20 DIAGNOSIS — R300 Dysuria: Secondary | ICD-10-CM

## 2017-10-20 NOTE — Telephone Encounter (Unsigned)
Copied from [place] [id number]. Topic: Appointment Scheduling - Scheduling Inquiry for Clinic >> Oct 19, 2017  4:42 PM Tricia [name] wrote: Reason for CRM: Pt states Dr. Juleen China wanted her to come in this week but there is limited availability to fit pts schedule. Pt is in [place] until Thursday 9/19 and was wondering if she could be fit in on that day. Please advise.

## 2017-10-22 ENCOUNTER — Other Ambulatory Visit: Payer: PPO

## 2017-10-26 ENCOUNTER — Telehealth: Payer: Self-pay | Admitting: Family Medicine

## 2017-10-26 ENCOUNTER — Ambulatory Visit (INDEPENDENT_AMBULATORY_CARE_PROVIDER_SITE_OTHER): Payer: PPO | Admitting: Family Medicine

## 2017-10-26 ENCOUNTER — Encounter: Payer: Self-pay | Admitting: Family Medicine

## 2017-10-26 VITALS — BP 130/80 | HR 59 | Ht 62.0 in | Wt 220.6 lb

## 2017-10-26 DIAGNOSIS — R300 Dysuria: Secondary | ICD-10-CM | POA: Diagnosis not present

## 2017-10-26 DIAGNOSIS — R1011 Right upper quadrant pain: Secondary | ICD-10-CM | POA: Diagnosis not present

## 2017-10-26 DIAGNOSIS — R109 Unspecified abdominal pain: Secondary | ICD-10-CM | POA: Diagnosis not present

## 2017-10-26 LAB — POC URINALSYSI DIPSTICK (AUTOMATED)
Bilirubin, UA: NEGATIVE
Blood, UA: NEGATIVE
Glucose, UA: NEGATIVE
Ketones, UA: NEGATIVE
Leukocytes, UA: NEGATIVE
Nitrite, UA: NEGATIVE
Protein, UA: NEGATIVE
Spec Grav, UA: 1.015 (ref 1.010–1.025)
Urobilinogen, UA: 0.2 E.U./dL
pH, UA: 5.5 (ref 5.0–8.0)

## 2017-10-26 NOTE — Telephone Encounter (Signed)
Patient was wanting an update on information for her prolia injection. Please advise.

## 2017-10-26 NOTE — Progress Notes (Signed)
Tricia [name] is a 72 y.o. female is here for follow up.  History of Present Illness:   Tricia [name], CMA acting as scribe for Dr. Briscoe Deutscher.   HPI: Patient called states that she has had increased back pain in kidney area. She states that her urine has been abnormal the last three times. The pain mediation she was on has made her have nausea and vomiting. She has not been able to take for the last week. She would like to know if their is something else she can get for the pain.   Health Maintenance Due  Topic Date Due  . INFLUENZA VACCINE  09/03/2017   Depression screen [facility] 2/9 07/03/2017 03/10/2016 12/03/2015  Decreased Interest 0 0 0  Down, Depressed, Hopeless 0 0 0  PHQ - 2 Score 0 0 0  Altered sleeping - - -  Tired, decreased energy - - -  Change in appetite - - -  Feeling bad or failure about yourself  - - -  Trouble concentrating - - -  Moving slowly or fidgety/restless - - -  Suicidal thoughts - - -  PHQ-9 Score - - -  Difficult doing work/chores - - -  Some recent data might be hidden   PMHx, SurgHx, SocialHx, FamHx, Medications, and Allergies were reviewed in the Visit Navigator and updated as appropriate.   Patient Active Problem List   Diagnosis Date Noted  . PVC's (premature ventricular contractions) 04/16/2017  . Hyperparathyroidism, secondary ([place]) 07/06/2016  . Right renal atrophy 04/19/2016  . COPD (chronic obstructive pulmonary disease) ([place]) 04/19/2016  . Aortic atherosclerosis ([place]) 04/19/2016  . Atherosclerosis of arteries 04/19/2016  . Sleep apnea, not on CPAP   . Diverticulosis   . Coronary artery disease involving native coronary artery of native heart without angina pectoris 03/10/2016  . Rib pain - s/p CPR 10/08/2015  . History of cardiac arrest 10/01/2015  . History of ST elevation myocardial infarction (STEMI) 10/01/2015  . Second hand smoke exposure 07/28/2015  . Postoperative malabsorption 07/27/2015  . Recurrent UTI 03/14/2014  .  GERD (gastroesophageal reflux disease) 02/14/2014  . History of gastric bypass 02/14/2014  . Former smoker 02/14/2014  . Diabetes mellitus type II, controlled ([place]) 02/14/2014  . Anemia, B12 deficiency 04/04/2009  . Osteoporosis 12/14/2006  . Hyperlipidemia associated with type 2 diabetes mellitus ([place]) 09/22/2006  . Hypertension associated with diabetes ([place]) 09/22/2006  . Osteoarthritis 09/22/2006  . 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 Adenomatous polyp of colon 02/04/2004   Social History   Tobacco Use  . Smoking status: Former Smoker    Packs/day: 2.00    Years: 41.00    Pack years: 82.00    Types: Cigarettes    Last attempt to quit: 03/06/2012    Years since quitting: 5.6  . Smokeless tobacco: Never Used  Substance Use Topics  . Alcohol use: No    Alcohol/week: 0.0 standard drinks  . Drug use: No   Current Medications and Allergies:   Current Outpatient Medications:  .  albuterol (PROVENTIL HFA;VENTOLIN HFA) 108 (90 Base) MCG/ACT inhaler, Inhale 2 puffs into the lungs every 6 (six) hours as needed for wheezing or shortness of breath., Disp: 1 Inhaler, Rfl: 3 .  amLODipine (NORVASC) 5 MG tablet, Take 1 tablet (5 mg total) by mouth daily., Disp: 90 tablet, Rfl: 3 .  aspirin EC 81 MG tablet, Take 81 mg by mouth daily., Disp: , Rfl:  .  atorvastatin (LIPITOR) 80  MG tablet, Take 1 tablet (80 mg total) by mouth daily., Disp: 90 tablet, Rfl: 2 .  baclofen (LIORESAL) 10 MG tablet, Take 1 tablet (10 mg total) by mouth 2 (two) times daily., Disp: 60 each, Rfl: 2 .  Biotin 10000 MCG TABS, Take 10,000 mcg by mouth daily. Dissolvable tablets, Disp: , Rfl:  .  blood glucose meter kit and supplies KIT, Dispense based on patient and insurance preference. Use to test daily. (FOR ICD-9 250.00, 250.01). 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  carvedilol (COREG) 3.125 MG tablet, Pt to take 3.125 mg with the carvedilol 6.25 mg  for a total of 9.375  Twice a day.,  Disp: 180 tablet, Rfl: 3 .  carvedilol (COREG) 6.25 MG tablet, Pt to take carvedilol 6.25 mg with the carvedilol 3.125 mg for a total of 9.375 mg twice da day., Disp: 180 tablet, Rfl: 3 .  ciprofloxacin (CIPRO) 250 MG tablet, Take 1 tablet (250 mg total) by mouth 2 (two) times daily., Disp: 6 tablet, Rfl: 0 .  clopidogrel (PLAVIX) 75 MG tablet, Take 1 tablet (75 mg total) by mouth daily., Disp: 90 tablet, Rfl: 2 .  denosumab (PROLIA) 60 MG/ML SOSY injection, Inject 60 mg into the skin every 6 (six) months., Disp: , Rfl:  .  furosemide (LASIX) 20 MG tablet, Take 1 tablet (20 mg total) by mouth daily., Disp: 30 tablet, Rfl: 3 .  gabapentin (NEURONTIN) 100 MG capsule, TAKE 1 CAPSULE BY MOUTH THREE TIMES A DAY, Disp: 270 capsule, Rfl: 1 .  glucose blood (FREESTYLE LITE) test strip, TEST UP TO FOUR TIMES DAILY Dx: E11.22, Disp: 360 each, Rfl: 5 .  HYDROcodone-acetaminophen (NORCO) 10-325 MG tablet, Take 1 tablet by mouth every 8 (eight) hours as needed., Disp: 30 tablet, Rfl: 0 .  Lancets (FREESTYLE) lancets, TEST UP TO FOUR TIMES DAILY Dx: E11.22, Disp: 360 each, Rfl: 5 .  nitroGLYCERIN (NITROLINGUAL) 0.4 MG/SPRAY spray, Place 1 spray under the tongue every 5 (five) minutes x 3 doses as needed for chest pain., Disp: 4.9 g, Rfl: 3 .  pantoprazole (PROTONIX) 40 MG tablet, Take 1 tablet (40 mg total) by mouth daily., Disp: 90 tablet, Rfl: 2 .  potassium citrate (UROCIT-K) 10 MEQ (1080 MG) SR tablet, TAKE 1 TABLET (10 MEQ TOTAL) BY MOUTH 2 (TWO) TIMES DAILY., Disp: 180 tablet, Rfl: 1 .  valsartan (DIOVAN) 320 MG tablet, Take 1 tablet (320 mg total) by mouth daily., Disp: 90 tablet, Rfl: 2   Allergies  Allergen Reactions  . Sulfa Antibiotics Diarrhea and Nausea And Vomiting   Review of Systems   Pertinent items are noted in the HPI. Otherwise, ROS is negative.  Vitals:   Vitals:   10/26/17 1405  BP: 130/80  Pulse: (!) 59  SpO2: 95%  Weight: 220 lb 9.6 oz (100.1 kg)  Height: '5\' 2"'  (1.575 m)      Body mass index is 40.35 kg/m.  Physical Exam:   Physical Exam  Constitutional: She appears well-nourished.  HENT:  Head: Normocephalic and atraumatic.  Eyes: Pupils are equal, round, and reactive to light. EOM are normal.  Neck: Normal range of motion. Neck supple.  Cardiovascular: Normal rate, regular rhythm, normal heart sounds and intact distal pulses.  Pulmonary/Chest: Effort normal.  Abdominal: Soft.  Right frank ttp.  Skin: Skin is warm.  Psychiatric: She has a normal mood and affect. Her behavior is normal.  Nursing note and vitals reviewed.  Results for orders placed or performed in visit  on 10/26/17  POCT Urinalysis Dipstick (Automated)  Result Value Ref Range   Color, UA Yellow    Clarity, UA Clear    Glucose, UA Negative Negative   Bilirubin, UA Negative    Ketones, UA Negative    Spec Grav, UA 1.015 1.010 - 1.025   Blood, UA Negative    pH, UA 5.5 5.0 - 8.0   Protein, UA Negative Negative   Urobilinogen, UA 0.2 0.2 or 1.0 E.U./dL   Nitrite, UA Negative    Leukocytes, UA Negative Negative    Assessment and Plan:   Am[place] was seen today for follow-up.  Diagnoses and all orders for this visit:  Flank pain, acute -     Cancel: CT Abdomen Pelvis Wo Contrast -     POCT Urinalysis Dipstick (Automated) -     Urine Culture -     CT RENAL STONE STUDY  Right upper quadrant abdominal pain Comments: Right flank pain, concern for stone v hydronephrosis. Orders: -     Cancel: CT Abdomen Pelvis Wo Contrast -     POCT Urinalysis Dipstick (Automated) -     Urine Culture -     CT RENAL STONE STUDY  Dysuria -     Cancel: CT Abdomen Pelvis Wo Contrast -     POCT Urinalysis Dipstick (Automated) -     Urine Culture -     CT RENAL STONE STUDY    . Reviewed expectations re: course of current medical issues. . Discussed self-management of symptoms. . Outlined signs and symptoms indicating need for more acute intervention. . Patient verbalized understanding and  all questions were answered. Marland Kitchen Health Maintenance issues including appropriate healthy diet, exercise, and smoking avoidance were discussed with patient. . See orders for this visit as documented in the electronic medical record. . Patient received an After Visit Summary.  CMA served as Education administrator during this visit. History, Physical, and Plan performed by medical provider. The above documentation has been reviewed and is accurate and complete. Briscoe Deutscher, D.O.  Briscoe Deutscher, DO [place], [place] [facility] 10/26/2017

## 2017-10-27 ENCOUNTER — Encounter: Payer: Self-pay | Admitting: Family Medicine

## 2017-10-27 ENCOUNTER — Ambulatory Visit (INDEPENDENT_AMBULATORY_CARE_PROVIDER_SITE_OTHER)
Admission: RE | Admit: 2017-10-27 | Discharge: 2017-10-27 | Disposition: A | Discharge status: Home / Self Care | Payer: PPO | Source: Ambulatory Visit | Attending: Family Medicine | Admitting: Family Medicine

## 2017-10-27 ENCOUNTER — Other Ambulatory Visit: Payer: Self-pay | Admitting: Family Medicine

## 2017-10-27 DIAGNOSIS — R1011 Right upper quadrant pain: Secondary | ICD-10-CM

## 2017-10-27 DIAGNOSIS — R300 Dysuria: Secondary | ICD-10-CM | POA: Diagnosis not present

## 2017-10-27 DIAGNOSIS — K439 Ventral hernia without obstruction or gangrene: Secondary | ICD-10-CM | POA: Diagnosis not present

## 2017-10-27 DIAGNOSIS — R109 Unspecified abdominal pain: Secondary | ICD-10-CM | POA: Diagnosis not present

## 2017-10-27 DIAGNOSIS — R600 Localized edema: Secondary | ICD-10-CM

## 2017-10-27 LAB — URINE CULTURE
MICRO NUMBER:: 91139789
Result:: NO GROWTH
SPECIMEN QUALITY:: ADEQUATE

## 2017-10-28 ENCOUNTER — Other Ambulatory Visit: Payer: Self-pay

## 2017-10-28 DIAGNOSIS — N390 Urinary tract infection, site not specified: Secondary | ICD-10-CM

## 2017-10-29 ENCOUNTER — Other Ambulatory Visit: Payer: Self-pay | Admitting: Family Medicine

## 2017-10-29 DIAGNOSIS — G8929 Other chronic pain: Secondary | ICD-10-CM

## 2017-10-29 DIAGNOSIS — M5441 Lumbago with sciatica, right side: Principal | ICD-10-CM

## 2017-11-03 ENCOUNTER — Ambulatory Visit: Payer: PPO | Admitting: Gastroenterology

## 2017-11-03 ENCOUNTER — Encounter: Payer: Self-pay | Admitting: Gastroenterology

## 2017-11-03 VITALS — BP 170/60 | HR 54 | Ht 62.0 in | Wt 223.2 lb

## 2017-11-03 DIAGNOSIS — K219 Gastro-esophageal reflux disease without esophagitis: Secondary | ICD-10-CM | POA: Diagnosis not present

## 2017-11-03 MED ORDER — PANTOPRAZOLE SODIUM 40 MG PO TBEC: 40.0000 mg | DELAYED_RELEASE_TABLET | Freq: Two times a day (BID) | ORAL | 11 refills | Status: DC

## 2017-11-03 NOTE — Progress Notes (Signed)
    History of Present Illness: This is a 72 year old female with GERD who relates intermittent difficulty breathing, globus sensation and hoarseness for 6 months. She relates frequent nausea and rare episodes of vomiting.  She has been evaluated by her PCP and Radene Journey, MD. ENT who feel these symptoms are related to reflux.  Dr. Lucia Gaskins recommended changing her pantoprazole from every morning to every afternoon and this improved her symptom control however all symptoms persist. EGD performed in September 2014 for evaluation of GERD showed a prior gastric bypass and was otherwise unremarkable. Denies weight loss, abdominal pain, constipation, diarrhea, change in stool caliber, melena, hematochezia, dysphagia, chest pain.  Current Medications, Allergies, Past Medical History, Past Surgical History, Family History and Social History were reviewed in [employer] record.  Physical Exam: General: Well developed, well nourished, obese, no acute distress Head: Normocephalic and atraumatic Eyes:  sclerae anicteric, EOMI Ears: Normal auditory acuity Mouth: No deformity or lesions Lungs: Clear throughout to auscultation Heart: Regular rate and rhythm; no murmurs, rubs or bruits Abdomen: Soft, non tender and non distended. No masses, hepatosplenomegaly or hernias noted. Normal Bowel sounds Rectal: Not done Musculoskeletal: Symmetrical with no gross deformities  Pulses:  Normal pulses noted Extremities: No clubbing, cyanosis, edema or deformities noted Neurological: Alert oriented x 4, grossly nonfocal Psychological:  Alert and cooperative. Normal mood and affect   Assessment and Recommendations:  1. GERD with LPR.  Prior gastric bypass.  Follow all standard antireflux measures.  Avoid foods that trigger symptoms.  Frequent small meals and snacks given gastric bypass. Increase pantoprazole to 40 mg bid ac.  If symptoms not improved in 1 month she is advised to call for further  advice. REV in 2 months.  2.  Personal history of adenomatous colon polyps.  A 3-year interval surveillance colonoscopy is recommended in March 2022.

## 2017-11-03 NOTE — Patient Instructions (Signed)
Increase your pantoprazole 40 mg to one tablet by mouth twice daily before meals. A new prescription has been sent to your pharmacy.   Thank you for choosing me and [facility].  Pricilla Riffle. Dagoberto Ligas., MD., [facility] [place]

## 2017-11-06 ENCOUNTER — Telehealth: Payer: Self-pay | Admitting: Family Medicine

## 2017-11-06 NOTE — Telephone Encounter (Signed)
Copied from [place] [id number]. Topic: Quick Communication - See Telephone Encounter >> Nov 06, 2017 11:06 AM Rutherford Nail, NT wrote: CRM for notification. See Telephone encounter for: 11/06/17. Patient calling to speak with Tricia [name] regarding her prolia. Please advise.

## 2017-11-11 ENCOUNTER — Ambulatory Visit: Payer: PPO | Admitting: Cardiovascular Disease

## 2017-11-18 ENCOUNTER — Encounter: Payer: Self-pay | Admitting: Cardiovascular Disease

## 2017-11-18 ENCOUNTER — Ambulatory Visit: Payer: PPO | Admitting: Cardiovascular Disease

## 2017-11-18 VITALS — BP 116/66 | HR 83 | Ht 62.0 in | Wt 221.8 lb

## 2017-11-18 DIAGNOSIS — M79604 Pain in right leg: Secondary | ICD-10-CM

## 2017-11-18 DIAGNOSIS — E782 Mixed hyperlipidemia: Secondary | ICD-10-CM

## 2017-11-18 DIAGNOSIS — I10 Essential (primary) hypertension: Secondary | ICD-10-CM

## 2017-11-18 DIAGNOSIS — I2510 Atherosclerotic heart disease of native coronary artery without angina pectoris: Secondary | ICD-10-CM | POA: Diagnosis not present

## 2017-11-18 NOTE — Patient Instructions (Addendum)
Medication Instructions:  Your provider recommends that you continue on your current medications as directed. Please refer to the Current Medication list given to you today.    Labwork: None  Testing/Procedures: Your physician has requested that you have a lower extremity venous duplex. This test is an ultrasound of the veins in the legs. It looks at venous blood flow that carries blood from the heart to the legs. Allow one hour for a Lower Venous exam. There are no restrictions or special instructions.  Follow-Up: You have an appointment with Richardson Dopp, PA on May 18, 2018 at 9:15AM.  Any Other Special Instructions Will Be Listed Below (If Applicable).     If you need a refill on your cardiac medications before your next appointment, please call your pharmacy.

## 2017-11-18 NOTE — Progress Notes (Signed)
Cardiology Office Note:    Date:  11/22/2017   ID:  Tricia [name], DOB [birth year], MRN [id number]  PCP:  Briscoe Deutscher, DO  Cardiologist:  Sherren Mocha, MD  Electrophysiologist:  None   Referring MD: Briscoe Deutscher, DO   Chief Complaint  Patient presents with  . Leg Pain    History of Present Illness:    Tricia [name] is a 72 y.o. female with a hx of coronary artery disease, presenting for follow-up evaluation.  The patient has a history of remote bare-metal stenting of the LAD.  In 2017 she presented with an inferior STEMI complicated by ventricular fibrillation arrest.  She underwent primary PCI of the right coronary artery followed by staged intervention of the LAD during her index hospitalization.  LV function recovered with an ejection fraction of 55 to 65%.  The patient had a follow-up nuclear scan in 2018 demonstrating preserved LV function and normal perfusion with no evidence of ischemia or infarction.  She presents today for follow-up evaluation.  The patient is here alone.  She denies chest pain or pressure.  She is had no shortness of breath, orthopnea, PND, or heart palpitations.  She complains of pain behind the right knee and she is concerned about potential for blood clot.  She has had mild swelling of her legs but no major swelling or redness of the right leg.  Past Medical History:  Diagnosis Date  . Adenomatous polyp of colon 02/2004  . Anemia   . Anxiety   . Arthritis   . B12 deficiency   . CAD (coronary artery disease)    a. s/p remote BMS to LAD;  b. 09/2015 Inf STEMI/VF Arrest: LM nl, LAD 85p (staged PCI 2 days later w/ 3.0x32 Synergy DES), 40p/m, 25d, LCX 7m RCA 80ost/100p (2.75x32 Synergy DES), 667mEF 55-65%. // c. Myoview 2/18: EF 59, no ischemia or infarction; Normal study  . Dermatophytosis of groin and perianal area   . Diet Controlled Diabetes Mellitus   . Diverticulosis   . GERD (gastroesophageal reflux disease)   . H/O echocardiogram    a.  09/2015 Echo: EF 60-65%, no rwma, mild AI, mildly dil RA, mild to mod TR.  . Marland Kitchenyperlipidemia   . Hypertensive heart disease   . Low back pain    l5 disc  . Morbid obesity ([facility]  . Osteoporosis   . PVC's (premature ventricular contractions) 04/16/2017   Holter 3/19: NSR, average heart rate 69, frequent PVCs (5% total beats), rare supraventricular ectopics, no AF/flutter  . Sleep apnea 10/03/2009   Resolved after gastric bypass    . TOBACCO ABUSE 10/05/2009   Qualifier: Diagnosis of  By: CrStanford BreedMD, FAKandyce Rud . Ventricular fibrillation ([facility]08/28/2017   a. In setting of inferior STEMI.    Past Surgical History:  Procedure Laterality Date  . ABDOMINAL HYSTERECTOMY    . APPENDECTOMY    . BARIATRIC SURGERY    . CARDIAC CATHETERIZATION N/A 10/01/2015   Procedure: Left Heart Cath and Coronary Angiography;  Surgeon: DaLeonie ManMD;  [facility];  Service: Cardiovascular;  Laterality: N/A;  . CARDIAC CATHETERIZATION N/A 10/01/2015   Procedure: Coronary Stent Intervention;  Surgeon: DaLeonie ManMD;  [facility];  Service: Cardiovascular;  Laterality: N/A;  . CARDIAC CATHETERIZATION N/A 10/03/2015   Procedure: Coronary Stent Intervention;  Surgeon: ThTroy SineMD;  [facility];  Service: Cardiovascular;  Laterality: N/A;  .  CARDIAC CATHETERIZATION N/A 10/03/2015   Procedure: Coronary/Graft Angiography;  Surgeon: Troy Sine, MD;  Location: [facility];  Service: Cardiovascular;  Laterality: N/A;  . CARPAL TUNNEL RELEASE Bilateral   . CERVICAL DISC SURGERY    . CHOLECYSTECTOMY    . CORONARY ANGIOPLASTY  2002  . HERNIA REPAIR    . LUMBAR LAMINECTOMY     L3-4  . NEPHRECTOMY Right    Partial  . TONSILLECTOMY    . TOTAL KNEE ARTHROPLASTY Left   . TUBAL LIGATION    . ULNAR NERVE REPAIR Left     Current Medications: Current Meds  Medication Sig  . albuterol (PROVENTIL HFA;VENTOLIN HFA) 108 (90 Base)  MCG/ACT inhaler Inhale 2 puffs into the lungs every 6 (six) hours as needed for wheezing or shortness of breath.  Marland Kitchen amLODipine (NORVASC) 5 MG tablet Take 1 tablet (5 mg total) by mouth daily.  Marland Kitchen aspirin EC 81 MG tablet Take 81 mg by mouth daily.  Marland Kitchen atorvastatin (LIPITOR) 80 MG tablet Take 1 tablet (80 mg total) by mouth daily.  . baclofen (LIORESAL) 10 MG tablet Take 1 tablet (10 mg total) by mouth 2 (two) times daily.  . Biotin 10000 MCG TABS Take 10,000 mcg by mouth daily. Dissolvable tablets  . blood glucose meter kit and supplies KIT Dispense based on patient and insurance preference. Use to test daily. (FOR ICD-9 250.00, 250.01). Dx Code:E11.22 N18.1  . carvedilol (COREG) 3.125 MG tablet Pt to take 3.125 mg with the carvedilol 6.25 mg  for a total of 9.375  Twice a day.  . carvedilol (COREG) 6.25 MG tablet Pt to take carvedilol 6.25 mg with the carvedilol 3.125 mg for a total of 9.375 mg twice da day.  . clopidogrel (PLAVIX) 75 MG tablet Take 1 tablet (75 mg total) by mouth daily.  Marland Kitchen denosumab (PROLIA) 60 MG/ML SOSY injection Inject 60 mg into the skin every 6 (six) months.  . furosemide (LASIX) 20 MG tablet Take 1 tablet (20 mg total) by mouth daily.  Marland Kitchen gabapentin (NEURONTIN) 100 MG capsule TAKE 1 CAPSULE BY MOUTH THREE TIMES A DAY  . glucose blood (FREESTYLE LITE) test strip TEST UP TO FOUR TIMES DAILY Dx: E11.22  . HYDROcodone-acetaminophen (NORCO) 10-325 MG tablet Take 1 tablet by mouth every 8 (eight) hours as needed.  . Lancets (FREESTYLE) lancets TEST UP TO FOUR TIMES DAILY Dx: E11.22  . nitroGLYCERIN (NITROLINGUAL) 0.4 MG/SPRAY spray Place 1 spray under the tongue every 5 (five) minutes x 3 doses as needed for chest pain.  . pantoprazole (PROTONIX) 40 MG tablet Take 1 tablet (40 mg total) by mouth 2 (two) times daily before a meal.  . potassium chloride (K-DUR) 10 MEQ tablet TAKE 1 TABLET (10 MEQ TOTAL) BY MOUTH 2 (TWO) TIMES DAILY.  . valsartan (DIOVAN) 320 MG tablet Take 1 tablet  (320 mg total) by mouth daily.     Allergies:   Sulfa antibiotics   Social History   Socioeconomic History  . Marital status: Married    Spouse name: Not on file  . Number of children: Not on file  . Years of education: Not on file  . Highest education level: Not on file  Occupational History  . Occupation: retired    Fish farm manager: UNEMPLOYED  Social Needs  . Financial resource strain: Not on file  . Food insecurity:    Worry: Not on file    Inability: Not on file  . Transportation needs:    Medical: Not  on file    Non-medical: Not on file  Tobacco Use  . Smoking status: Former Smoker    Packs/day: 2.00    Years: 41.00    Pack years: 82.00    Types: Cigarettes    Last attempt to quit: 03/06/2012    Years since quitting: 5.7  . Smokeless tobacco: Never Used  Substance and Sexual Activity  . Alcohol use: No    Alcohol/week: 0.0 standard drinks  . Drug use: No  . Sexual activity: Yes  Lifestyle  . Physical activity:    Days per week: Not on file    Minutes per session: Not on file  . Stress: Not on file  Relationships  . Social connections:    Talks on phone: Not on file    Gets together: Not on file    Attends religious service: Not on file    Active member of club or organization: Not on file    Attends meetings of clubs or organizations: Not on file    Relationship status: Not on file  Other Topics Concern  . Not on file  Social History Narrative   Widowed in 04/2012. 2 children. 3 grandkids. Lives alone. Completely indendent. Disabled/retired. Disabled-lifted computer paper. Hobbies: spend money-gamble.     Family History: The patient's family history includes Colitis in her mother; Heart disease in her father; Lung cancer in her maternal uncle and maternal uncle; Stomach cancer in her maternal aunt. There is no history of Colon cancer.  ROS:   Please see the history of present illness.    Positive for leg swelling, back pain, muscle pain, easy bruising,  wheezing, joint swelling, balance problems.  All other systems reviewed and are negative.  EKGs/Labs/Other Studies Reviewed:    Recent Labs: 04/03/2017: Magnesium 2.3 07/03/2017: ALT 10; BUN 22; Creatinine, Ser 0.96; Hemoglobin 12.7; Platelets 224.0; Potassium 4.7; Sodium 142   Recent Lipid Panel    Component Value Date/Time   CHOL 162 07/03/2017 0824   CHOL 125 03/05/2016 0948   TRIG 115.0 07/03/2017 0824   TRIG 122 12/11/2005 1028   HDL 48.60 07/03/2017 0824   HDL 45 03/05/2016 0948   CHOLHDL 3 07/03/2017 0824   VLDL 23.0 07/03/2017 0824   LDLCALC 91 07/03/2017 0824   LDLCALC 63 03/05/2016 0948   LDLDIRECT 86.0 09/11/2015 1104    Physical Exam:    VS:  BP 116/66   Pulse 83   Ht '5\' 2"'  (1.575 m)   Wt 221 lb 12.8 oz (100.6 kg)   SpO2 99%   BMI 40.57 kg/m     Wt Readings from Last 3 Encounters:  11/18/17 221 lb 12.8 oz (100.6 kg)  11/03/17 223 lb 3.2 oz (101.2 kg)  10/26/17 220 lb 9.6 oz (100.1 kg)     GEN: Obese woman in no acute distress HEENT: Normal NECK: No JVD; No carotid bruits LYMPHATICS: No lymphadenopathy CARDIAC: RRR, no murmurs, rubs, gallops RESPIRATORY:  Clear to auscultation without rales, wheezing or rhonchi  ABDOMEN: Soft, non-tender, non-distended MUSCULOSKELETAL:  Trace bilateral pretibial edema; No deformity  SKIN: Warm and dry NEUROLOGIC:  Alert and oriented x 3 PSYCHIATRIC:  Normal affect   ASSESSMENT:    1. Right leg pain   2. Coronary artery disease involving native coronary artery of native heart without angina pectoris   3. Mixed hyperlipidemia   4. Essential hypertension   5. Morbid obesity ([place])    PLAN:    In order of problems listed above:  1. Pain located  in right popliteal fossa. Discussed differential dx including DVT versus Baker's cyst. Recommend venous duplex.  2. Symptoms stable. No angina at present. Medical program reviewed (ASA, statin, beta blocker, ARB, high intensity statin). She mentions potential need for back  surgery. Her risk is acceptable as she can do > 4 mets without symptoms. If necessary she can proceed without further cardiac testing.  3. Pt treated with a high-intensity statin drug. Last lipids reviewed. 4. BP well controlled on current Rx.  5. BMI > 40. Lifestyle modification program reviewed with the patient today.   Medication Adjustments/Labs and Tests Ordered: Current medicines are reviewed at length with the patient today.  Concerns regarding medicines are outlined above.  No orders of the defined types were placed in this encounter.  No orders of the defined types were placed in this encounter.   Patient Instructions  Medication Instructions:  Your provider recommends that you continue on your current medications as directed. Please refer to the Current Medication list given to you today.    Labwork: None  Testing/Procedures: Your physician has requested that you have a lower extremity venous duplex. This test is an ultrasound of the veins in the legs. It looks at venous blood flow that carries blood from the heart to the legs. Allow one hour for a Lower Venous exam. There are no restrictions or special instructions.  Follow-Up: You have an appointment with Richardson Dopp, PA on May 18, 2018 at 9:15AM.  Any Other Special Instructions Will Be Listed Below (If Applicable).     If you need a refill on your cardiac medications before your next appointment, please call your pharmacy.      Signed, Sherren Mocha, MD  11/22/2017 11:57 AM    [place]

## 2017-11-19 ENCOUNTER — Ambulatory Visit (HOSPITAL_COMMUNITY)
Admission: RE | Admit: 2017-11-19 | Discharge: 2017-11-19 | Disposition: A | Discharge status: Home / Self Care | Payer: PPO | Source: Ambulatory Visit | Attending: Cardiovascular Disease | Admitting: Cardiovascular Disease

## 2017-11-19 DIAGNOSIS — M79604 Pain in right leg: Secondary | ICD-10-CM | POA: Diagnosis not present

## 2017-11-19 DIAGNOSIS — M5136 Other intervertebral disc degeneration, lumbar region: Secondary | ICD-10-CM | POA: Diagnosis not present

## 2017-11-19 DIAGNOSIS — M47816 Spondylosis without myelopathy or radiculopathy, lumbar region: Secondary | ICD-10-CM | POA: Diagnosis not present

## 2017-11-19 DIAGNOSIS — M4316 Spondylolisthesis, lumbar region: Secondary | ICD-10-CM | POA: Diagnosis not present

## 2017-11-19 DIAGNOSIS — M48062 Spinal stenosis, lumbar region with neurogenic claudication: Secondary | ICD-10-CM | POA: Diagnosis not present

## 2017-11-19 NOTE — Telephone Encounter (Signed)
Pt called to check status. Pt would like a call back please Cb#[phone]

## 2017-11-19 NOTE — Telephone Encounter (Signed)
See note

## 2017-11-22 ENCOUNTER — Encounter: Payer: Self-pay | Admitting: Cardiovascular Disease

## 2017-11-24 DIAGNOSIS — M48061 Spinal stenosis, lumbar region without neurogenic claudication: Secondary | ICD-10-CM | POA: Diagnosis not present

## 2017-11-24 DIAGNOSIS — M48062 Spinal stenosis, lumbar region with neurogenic claudication: Secondary | ICD-10-CM | POA: Diagnosis not present

## 2017-11-24 DIAGNOSIS — M47816 Spondylosis without myelopathy or radiculopathy, lumbar region: Secondary | ICD-10-CM | POA: Diagnosis not present

## 2017-11-24 DIAGNOSIS — M5126 Other intervertebral disc displacement, lumbar region: Secondary | ICD-10-CM | POA: Diagnosis not present

## 2017-11-25 ENCOUNTER — Other Ambulatory Visit: Payer: Self-pay | Admitting: Family Medicine

## 2017-11-25 DIAGNOSIS — R600 Localized edema: Secondary | ICD-10-CM

## 2017-11-27 NOTE — Telephone Encounter (Signed)
Tricia [name] will address.

## 2017-12-01 NOTE — Telephone Encounter (Signed)
Called and spoke with patient after speaking to Prolia rep. We are going to send a prescription for her Prolia to her pharmacy and she will take her prescription card from [facility] and pick up her injection. She will then bring it here for administration. This will simplify billing. Patient verbalized understanding and is in agreement.   Papers will be resubmitted to [place] from last injection.

## 2017-12-09 DIAGNOSIS — M5416 Radiculopathy, lumbar region: Secondary | ICD-10-CM | POA: Diagnosis not present

## 2017-12-09 DIAGNOSIS — M48062 Spinal stenosis, lumbar region with neurogenic claudication: Secondary | ICD-10-CM | POA: Diagnosis not present

## 2017-12-09 DIAGNOSIS — M4316 Spondylolisthesis, lumbar region: Secondary | ICD-10-CM | POA: Diagnosis not present

## 2017-12-09 DIAGNOSIS — M4155 Other secondary scoliosis, thoracolumbar region: Secondary | ICD-10-CM | POA: Diagnosis not present

## 2017-12-10 ENCOUNTER — Telehealth: Payer: Self-pay

## 2017-12-10 NOTE — Telephone Encounter (Signed)
   [facility] Pre-operative Risk Assessment    Request for surgical clearance:  1. What type of surgery is being performed?  Possible lumbar injection/surgery   2. When is this surgery scheduled?  TBD   3. What type of clearance is required (medical clearance vs. Pharmacy clearance to hold med vs. Both)?  medical  4. Are there any medications that need to be held prior to surgery and how long?    5. Practice name and name of physician performing surgery?  [facility]   6. What is your office phone number [phone]    7.   What is your office fax number [phone]  8.   Anesthesia type (None, local, MAC, general) ?     Legrand Como  Berlyn Malina 12/10/2017, 10:02 AM  _________________________________________________________________   (provider comments below)

## 2017-12-11 NOTE — Telephone Encounter (Signed)
   Primary Cardiologist: Sherren Mocha, MD  Chart reviewed as part of pre-operative protocol coverage.   Dr. Burt Knack recently saw patient in 11/18/17 and commented on surgical clearance, stating, "Her risk is acceptable as she can do >4 mets without symptoms. If necessary she can proceed without further cardiac testing. " Regarding holding of antiplatelets, he states, "Pt at low risk of holding plavix x 5 days for surgery. Acceptable to hold ASA x 7 days if necessary."  I will route this recommendation to the requesting party via Epic fax function and remove from pre-op pool.  Please call with questions.  Charlie Pitter, PA-C 12/11/2017, 3:43 PM

## 2017-12-11 NOTE — Telephone Encounter (Signed)
Pt at low risk of holding plavix x 5 days for surgery. Acceptable to hold ASA x 7 days if necessary.

## 2017-12-11 NOTE — Telephone Encounter (Signed)
   Primary Cardiologist: Sherren Mocha, MD  Chart reviewed as part of pre-operative protocol coverage. Dr. Burt Knack recently saw patient in 11/18/17 and commented on surgical clearance, stating, "Her risk is acceptable as she can do > 4 mets without symptoms. If necessary she can proceed without further cardiac testing. " (Thank you Dr. Burt Knack!)  I suspect we are also being asked if we can hold antiplatelets - this info is missing on the clearance. I will route to Dr. Burt Knack for input on this so we can fax back consolidated recommendation. She is on ASA/Plavix.  Dr. Burt Knack - Please route response to [facility] PREOP (the pre-op pool). Thank you.   Charlie Pitter, PA-C 12/11/2017, 10:48 AM

## 2017-12-14 DIAGNOSIS — R1084 Generalized abdominal pain: Secondary | ICD-10-CM | POA: Diagnosis not present

## 2017-12-14 DIAGNOSIS — N3020 Other chronic cystitis without hematuria: Secondary | ICD-10-CM | POA: Diagnosis not present

## 2017-12-29 ENCOUNTER — Other Ambulatory Visit: Payer: Self-pay | Admitting: Family Medicine

## 2017-12-29 DIAGNOSIS — R600 Localized edema: Secondary | ICD-10-CM

## 2017-12-30 ENCOUNTER — Other Ambulatory Visit: Payer: Self-pay | Admitting: Family Medicine

## 2017-12-30 DIAGNOSIS — R600 Localized edema: Secondary | ICD-10-CM

## 2018-01-04 IMAGING — CT CT ABD-PELV W/O CM
2 of 4 series · 9 of 46 positions shown, 10 images · non-contrast
Comparison: CT of the abdomen and pelvis performed 03/18/2007

CLINICAL DATA: Acute onset of right-sided flank pain with
urination. Initial encounter.

EXAM:
CT ABDOMEN AND PELVIS WITHOUT CONTRAST
TECHNIQUE: Multidetector CT imaging of the abdomen and pelvis was performed
following the standard protocol without IV contrast.

[Series 201: routine, idose (2) · axial · 0.90mm/px · z∈[+80,+420]mm · 6 of 88 slices shown, 7 images]
[im 10/88  soft-tissue]
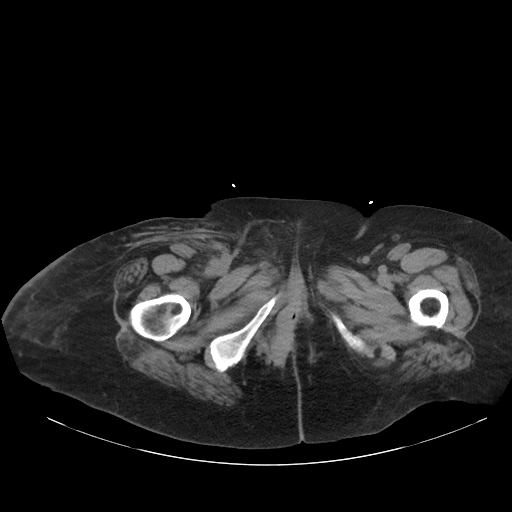
[im 10/88  bone]
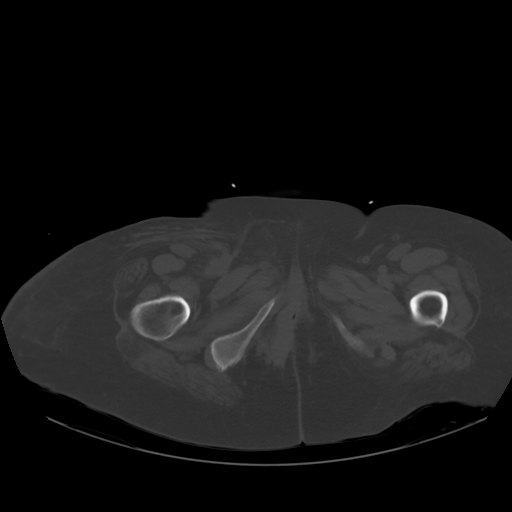
[im 23/88  soft-tissue]
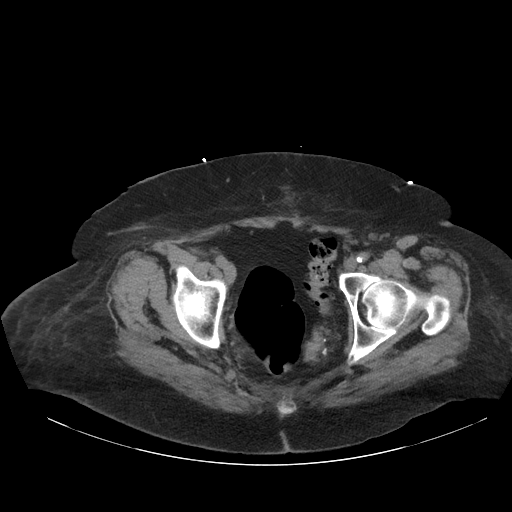
[im 36/88  soft-tissue]
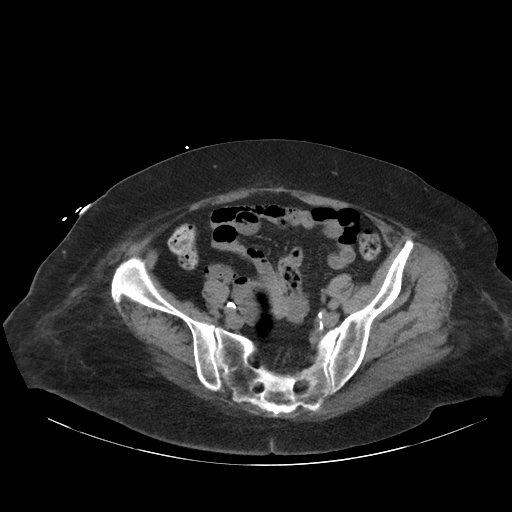
[im 52/88  soft-tissue]
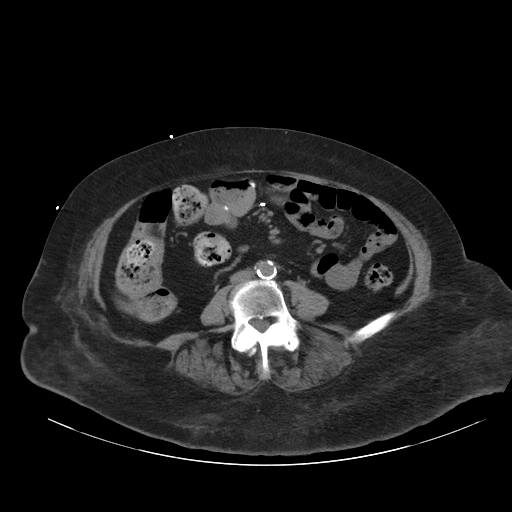
[im 65/88  soft-tissue]
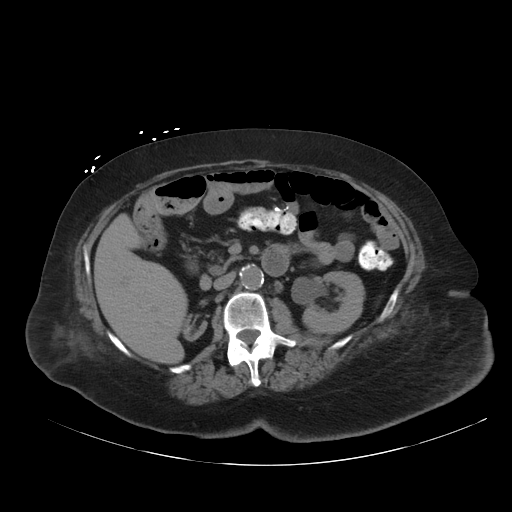
[im 78/88  soft-tissue]
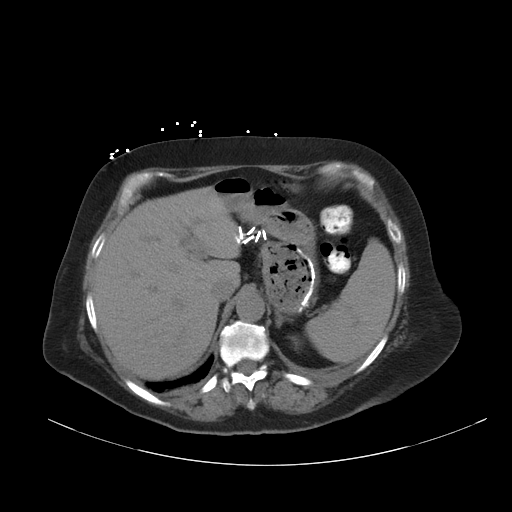

[Series 203: coronals, idose (2) · coronal · 0.45mm/px · 3 of 125 slices shown]
[im 42/125  soft-tissue]
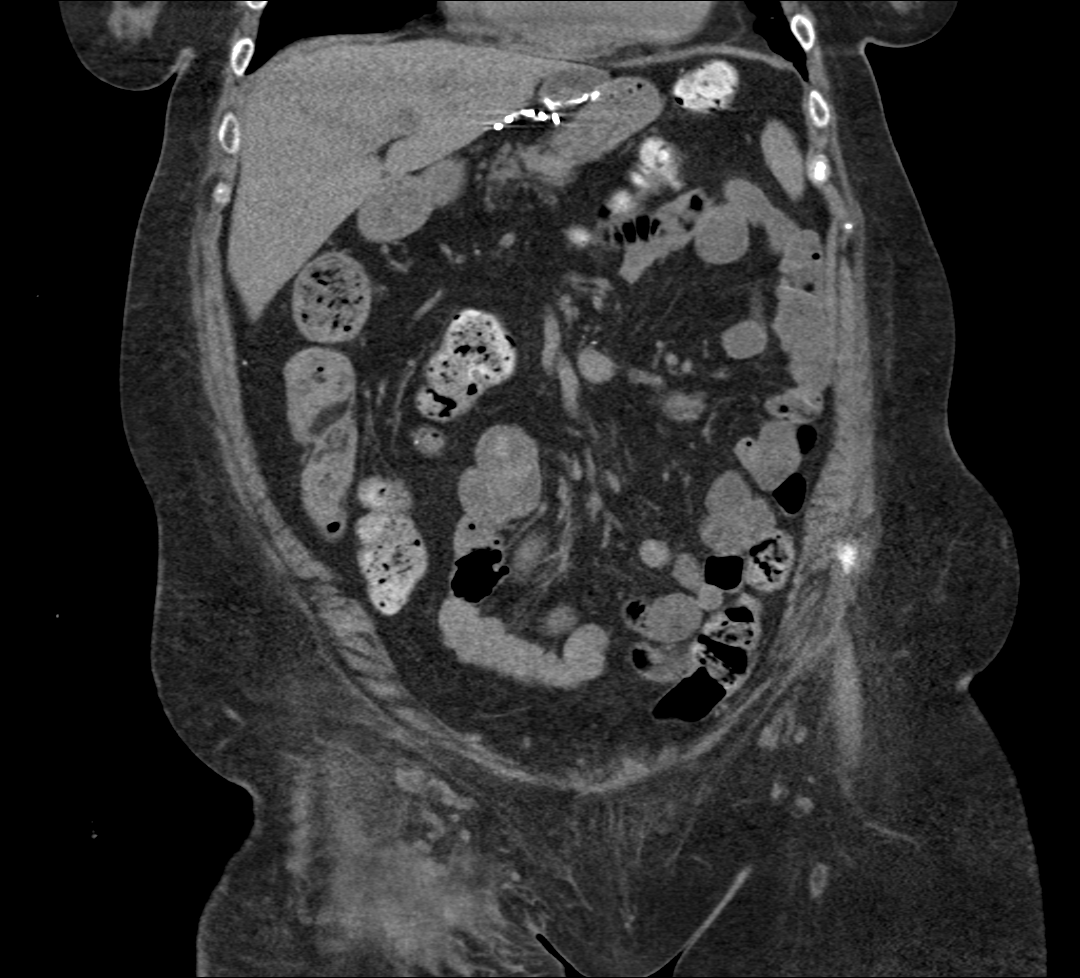
[im 56/125  soft-tissue]
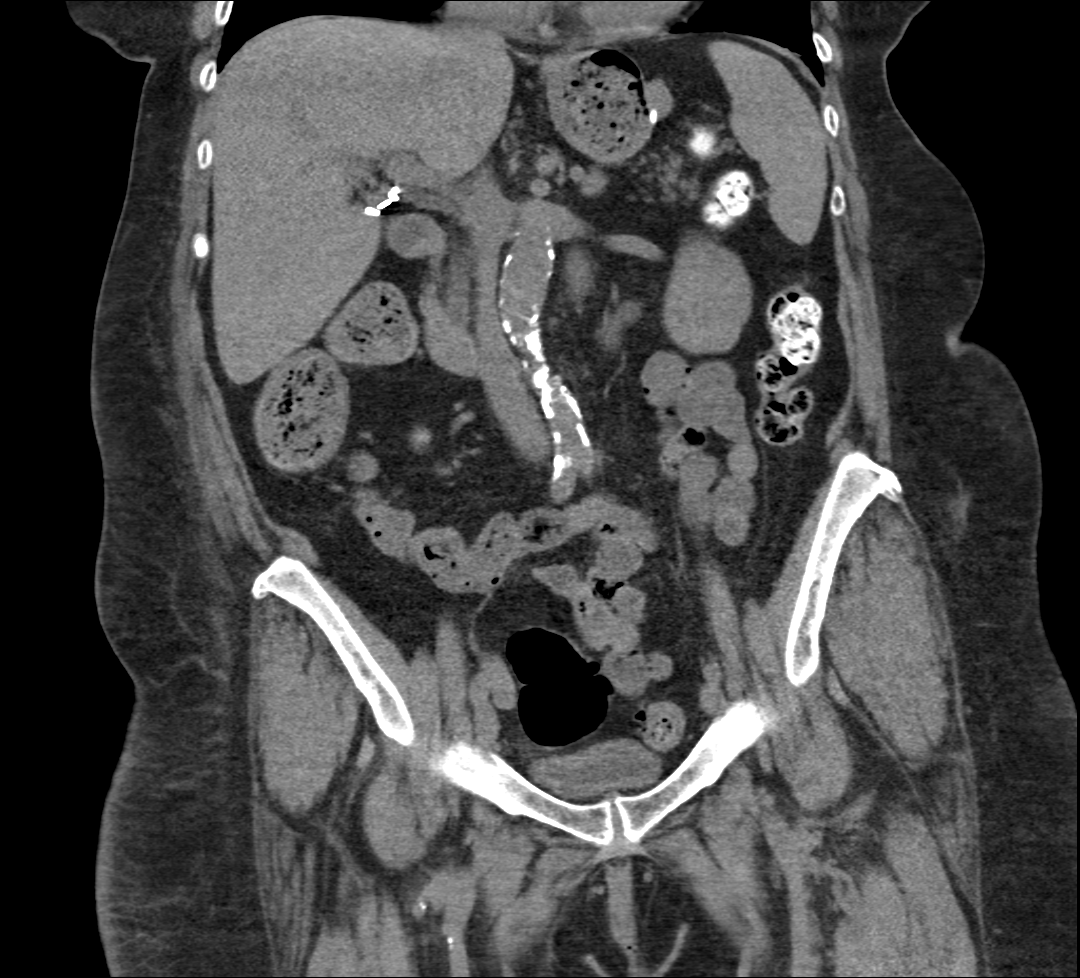
[im 69/125  soft-tissue]
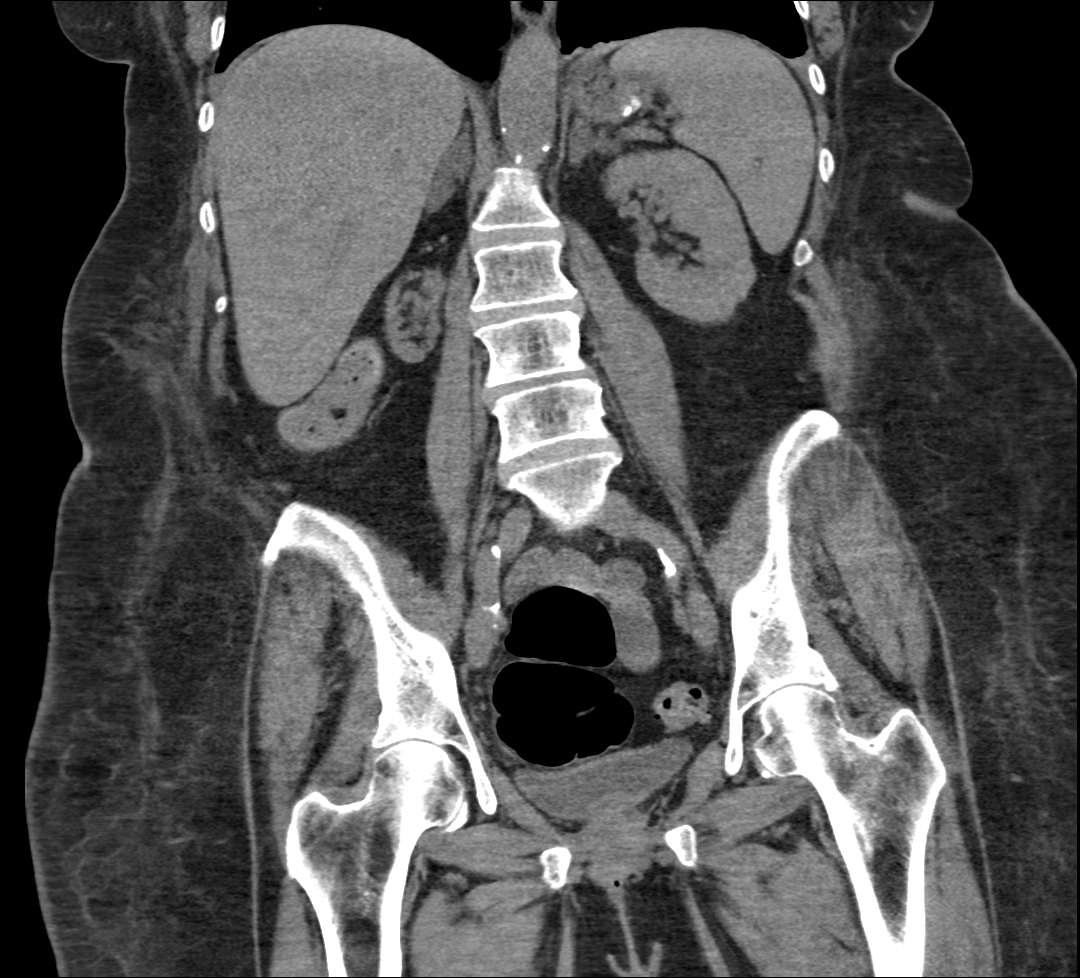

[9 of 46 positions shown; findings below may reference images not displayed]

FINDINGS: A trace left-sided pleural effusion is noted, with associated
atelectasis. Mild coronary artery calcification is seen.
Postoperative change is noted about the stomach.

The liver and spleen are unremarkable in appearance. The patient is
status post cholecystectomy, with clips noted at the gallbladder
fossa. The pancreas and adrenal glands are unremarkable.

There is marked chronic right renal atrophy. A 4 mm nonobstructing
stone is noted at the upper pole of the left kidney. There is no
evidence of hydronephrosis. No obstructing ureteral stones are seen.
No perinephric stranding is appreciated.

No free fluid is identified. The small bowel is unremarkable in
appearance. The stomach is within normal limits. No acute vascular
abnormalities are seen. Mild scattered calcification is noted along
the abdominal aorta and its branches.

The appendix is unremarkable in appearance; there is no evidence of
appendicitis. The colon is unremarkable.

The bladder is mildly distended and grossly unremarkable. The
patient is status post hysterectomy. No suspicious adnexal masses
are seen. No inguinal lymphadenopathy is seen.

Soft tissue inflammation is noted at the right inguinal region, of
uncertain significance.

No acute osseous abnormalities are identified. There is grade 1
anterolisthesis of L4 on L5, with underlying facet disease.
IMPRESSION: 1. No acute abnormality seen to explain the patient's symptoms.
2. Soft tissue inflammation at the right inguinal region, of
uncertain significance.
3. Marked chronic right renal atrophy, significantly worsened from
2886. 4 mm nonobstructing stone at the upper pole of the left
kidney.
4. Trace left-sided pleural effusion, with associated atelectasis.
5. Mild coronary artery calcifications seen.
6. Mild scattered calcification along the abdominal aorta and its
branches.

## 2018-01-05 DIAGNOSIS — H25043 Posterior subcapsular polar age-related cataract, bilateral: Secondary | ICD-10-CM | POA: Diagnosis not present

## 2018-01-05 DIAGNOSIS — H40033 Anatomical narrow angle, bilateral: Secondary | ICD-10-CM | POA: Diagnosis not present

## 2018-01-05 DIAGNOSIS — H40031 Anatomical narrow angle, right eye: Secondary | ICD-10-CM | POA: Diagnosis not present

## 2018-01-05 DIAGNOSIS — H18413 Arcus senilis, bilateral: Secondary | ICD-10-CM | POA: Diagnosis not present

## 2018-01-05 DIAGNOSIS — H2511 Age-related nuclear cataract, right eye: Secondary | ICD-10-CM | POA: Diagnosis not present

## 2018-01-05 DIAGNOSIS — H25013 Cortical age-related cataract, bilateral: Secondary | ICD-10-CM | POA: Diagnosis not present

## 2018-01-05 DIAGNOSIS — H02831 Dermatochalasis of right upper eyelid: Secondary | ICD-10-CM | POA: Diagnosis not present

## 2018-01-05 DIAGNOSIS — H2513 Age-related nuclear cataract, bilateral: Secondary | ICD-10-CM | POA: Diagnosis not present

## 2018-01-11 ENCOUNTER — Telehealth: Payer: Self-pay | Admitting: *Deleted

## 2018-01-11 NOTE — Telephone Encounter (Signed)
   [facility] Pre-operative Risk Assessment    Request for surgical clearance:  1. What type of surgery is being performed? CATARACT EXTRACTION W/INTRAOCULAR LENS IMPLANT OF THE RIGHT EYE ON 02/08/18; SAME PROCEDURE TO FOLLOW ON THE LEFT EYE ON 03/01/18   2. When is this surgery scheduled? 02/08/18 AND 03/01/18   3. Are there any medications that need to be held prior to surgery and how long? PER DR. Christia Reading BEVIS PT WILL NOT NEED TO HOLD ASA   4. Practice name and name of physician performing surgery? [facility] DR. Christia Reading BEVIS   5. What is your office phone and fax number? PH# [phone]; FAX# [phone]   6. Anesthesia type (None, local, MAC, general) ? TOPICAL W/IV MEDICATION   Julaine Hua 01/11/2018, 8:26 AM  _________________________________________________________________   (provider comments below)

## 2018-01-12 DIAGNOSIS — H40032 Anatomical narrow angle, left eye: Secondary | ICD-10-CM | POA: Diagnosis not present

## 2018-01-12 NOTE — Telephone Encounter (Signed)
Attempted to reach requesting office in regards to Plavix . LVM for nurse to call back and ask for preop pool.

## 2018-01-12 NOTE — Telephone Encounter (Signed)
Please check with [facility],  Does pt need to hold plavix?  Thanks.

## 2018-01-13 ENCOUNTER — Other Ambulatory Visit: Payer: Self-pay | Admitting: Family Medicine

## 2018-01-13 DIAGNOSIS — R600 Localized edema: Secondary | ICD-10-CM

## 2018-01-13 NOTE — Telephone Encounter (Signed)
   Primary Cardiologist: Sherren Mocha, MD  Chart reviewed as part of pre-operative protocol coverage. Cataract extractions are recognized in guidelines as low risk surgeries that do not typically require specific preoperative testing or holding of blood thinner therapy. Therefore, given past medical history and time since last visit, based on ACC/AHA guidelines, ZYLEE MARCHIANO would be at acceptable risk for the planned procedure without further cardiovascular testing.   If Plavix needs to be held, it can be held for 5 days and resume as soon as safe. Do not hold if not needed to be held. Continue aspirin.   I will route this recommendation to the requesting party via Epic fax function and remove from pre-op pool.  Please call with questions.  Daune Perch, NP 01/13/2018, 2:49 PM

## 2018-01-13 NOTE — Telephone Encounter (Signed)
2nd attempt to reach requesting office in regards to Plavix, Left VM for surgery scheduler to call back

## 2018-01-14 NOTE — Telephone Encounter (Signed)
Follow up   Per Nancee, the patient does not need to stop plavix before cataract surgery. If you have any question per Nancee please call her.

## 2018-01-18 ENCOUNTER — Encounter: Payer: Self-pay | Admitting: Family Medicine

## 2018-01-18 DIAGNOSIS — M8949 Other hypertrophic osteoarthropathy, multiple sites: Secondary | ICD-10-CM

## 2018-01-18 DIAGNOSIS — M150 Primary generalized (osteo)arthritis: Principal | ICD-10-CM

## 2018-01-18 DIAGNOSIS — M159 Polyosteoarthritis, unspecified: Secondary | ICD-10-CM

## 2018-01-19 MED ORDER — HYDROCODONE-ACETAMINOPHEN 10-325 MG PO TABS: 1.0000 | ORAL_TABLET | Freq: Three times a day (TID) | ORAL | 0 refills | Status: DC | PRN

## 2018-01-19 MED ORDER — ALBUTEROL SULFATE HFA 108 (90 BASE) MCG/ACT IN AERS: 2.0000 | INHALATION_SPRAY | Freq: Four times a day (QID) | RESPIRATORY_TRACT | 3 refills | Status: DC | PRN

## 2018-01-19 MED ORDER — GLUCOSE BLOOD VI STRP: ORAL_STRIP | 5 refills | Status: DC

## 2018-01-19 NOTE — Telephone Encounter (Signed)
Patient last office visit was 9/23 for flank pain.  Last script for norco given 09/28/17 for #30 with no refills No f/u

## 2018-02-02 ENCOUNTER — Encounter: Payer: Self-pay | Admitting: Family Medicine

## 2018-02-02 ENCOUNTER — Other Ambulatory Visit: Payer: Self-pay

## 2018-02-02 DIAGNOSIS — G8929 Other chronic pain: Secondary | ICD-10-CM

## 2018-02-02 DIAGNOSIS — M5441 Lumbago with sciatica, right side: Principal | ICD-10-CM

## 2018-02-02 MED ORDER — BACLOFEN 10 MG PO TABS: 10.0000 mg | ORAL_TABLET | Freq: Two times a day (BID) | ORAL | 1 refills | Status: DC

## 2018-02-02 NOTE — Telephone Encounter (Signed)
See note  Copied from [place] [phone]. Topic: General - Other >> Feb 02, 2018  9:12 AM Rayann Heman wrote: Reason for CRM: pt called and stated that she would like to get her 2nd shot of prolia and she would like to get it at the pharmacy. Pt would like to know what she needs to do. Please advise

## 2018-02-08 DIAGNOSIS — H2511 Age-related nuclear cataract, right eye: Secondary | ICD-10-CM | POA: Diagnosis not present

## 2018-02-09 DIAGNOSIS — H2512 Age-related nuclear cataract, left eye: Secondary | ICD-10-CM | POA: Diagnosis not present

## 2018-02-26 ENCOUNTER — Other Ambulatory Visit: Payer: Self-pay | Admitting: Physician Assistant

## 2018-02-26 ENCOUNTER — Other Ambulatory Visit: Payer: Self-pay | Admitting: Family Medicine

## 2018-02-26 DIAGNOSIS — G8929 Other chronic pain: Secondary | ICD-10-CM

## 2018-02-26 DIAGNOSIS — M5441 Lumbago with sciatica, right side: Principal | ICD-10-CM

## 2018-03-01 DIAGNOSIS — H2512 Age-related nuclear cataract, left eye: Secondary | ICD-10-CM | POA: Diagnosis not present

## 2018-03-28 ENCOUNTER — Other Ambulatory Visit: Payer: Self-pay | Admitting: Family Medicine

## 2018-03-28 ENCOUNTER — Other Ambulatory Visit: Payer: Self-pay | Admitting: Physician Assistant

## 2018-03-28 DIAGNOSIS — M5441 Lumbago with sciatica, right side: Principal | ICD-10-CM

## 2018-03-28 DIAGNOSIS — G8929 Other chronic pain: Secondary | ICD-10-CM

## 2018-03-29 ENCOUNTER — Other Ambulatory Visit: Payer: Self-pay | Admitting: Acute Care

## 2018-03-29 ENCOUNTER — Other Ambulatory Visit (HOSPITAL_BASED_OUTPATIENT_CLINIC_OR_DEPARTMENT_OTHER): Payer: Self-pay | Admitting: Family Medicine

## 2018-03-29 ENCOUNTER — Telehealth: Payer: Self-pay | Admitting: Acute Care

## 2018-03-29 ENCOUNTER — Ambulatory Visit (HOSPITAL_BASED_OUTPATIENT_CLINIC_OR_DEPARTMENT_OTHER)
Admission: RE | Admit: 2018-03-29 | Discharge: 2018-03-29 | Disposition: A | Discharge status: Home / Self Care | Payer: PPO | Source: Ambulatory Visit | Attending: Family Medicine | Admitting: Family Medicine

## 2018-03-29 DIAGNOSIS — Z122 Encounter for screening for malignant neoplasm of respiratory organs: Secondary | ICD-10-CM

## 2018-03-29 DIAGNOSIS — Z1231 Encounter for screening mammogram for malignant neoplasm of breast: Secondary | ICD-10-CM | POA: Diagnosis not present

## 2018-03-29 DIAGNOSIS — Z87891 Personal history of nicotine dependence: Secondary | ICD-10-CM

## 2018-03-29 NOTE — Telephone Encounter (Signed)
Tricia [name] has been rescheduled at [facility] in [facility] and she is aware of the location and appt change

## 2018-03-29 NOTE — Telephone Encounter (Signed)
Tricia [name], can you get this rescheduled for this pt at [facility]?

## 2018-04-19 ENCOUNTER — Ambulatory Visit (HOSPITAL_BASED_OUTPATIENT_CLINIC_OR_DEPARTMENT_OTHER)
Admission: RE | Admit: 2018-04-19 | Discharge: 2018-04-19 | Disposition: A | Discharge status: Home / Self Care | Payer: PPO | Source: Ambulatory Visit | Attending: Acute Care | Admitting: Acute Care

## 2018-04-19 ENCOUNTER — Ambulatory Visit: Payer: PPO

## 2018-04-19 ENCOUNTER — Other Ambulatory Visit: Payer: Self-pay

## 2018-04-19 DIAGNOSIS — Z122 Encounter for screening for malignant neoplasm of respiratory organs: Secondary | ICD-10-CM

## 2018-04-19 DIAGNOSIS — Z87891 Personal history of nicotine dependence: Secondary | ICD-10-CM | POA: Diagnosis not present

## 2018-04-21 ENCOUNTER — Other Ambulatory Visit: Payer: Self-pay | Admitting: Family Medicine

## 2018-04-21 ENCOUNTER — Telehealth: Payer: Self-pay | Admitting: Acute Care

## 2018-04-21 DIAGNOSIS — M159 Polyosteoarthritis, unspecified: Secondary | ICD-10-CM

## 2018-04-21 DIAGNOSIS — M150 Primary generalized (osteo)arthritis: Principal | ICD-10-CM

## 2018-04-21 DIAGNOSIS — Z122 Encounter for screening for malignant neoplasm of respiratory organs: Secondary | ICD-10-CM

## 2018-04-21 DIAGNOSIS — M8949 Other hypertrophic osteoarthropathy, multiple sites: Secondary | ICD-10-CM

## 2018-04-21 DIAGNOSIS — Z87891 Personal history of nicotine dependence: Secondary | ICD-10-CM

## 2018-04-21 MED ORDER — HYDROCODONE-ACETAMINOPHEN 10-325 MG PO TABS: 1.0000 | ORAL_TABLET | Freq: Three times a day (TID) | ORAL | 0 refills | Status: DC | PRN

## 2018-04-21 NOTE — Telephone Encounter (Signed)
Pt informed of CT results per Sarah Groce, NP.  PT verbalized understanding.  Copy sent to PCP.  Order placed for 1 yr f/u CT.  

## 2018-04-27 ENCOUNTER — Other Ambulatory Visit: Payer: Self-pay | Admitting: Family Medicine

## 2018-04-27 DIAGNOSIS — M5441 Lumbago with sciatica, right side: Principal | ICD-10-CM

## 2018-04-27 DIAGNOSIS — G8929 Other chronic pain: Secondary | ICD-10-CM

## 2018-05-12 ENCOUNTER — Telehealth: Payer: Self-pay | Admitting: Physician Assistant

## 2018-05-12 NOTE — Telephone Encounter (Signed)
°  4/8 :  Spoke with patient. Patient is active on MyChart and can login. Patient has MyChart App on smartphone and tablet. Patient downloaded Webex on phone and tablet. Patient could get into Webex with audio/video successfully.

## 2018-05-13 ENCOUNTER — Other Ambulatory Visit: Payer: Self-pay

## 2018-05-13 MED ORDER — ALBUTEROL SULFATE HFA 108 (90 BASE) MCG/ACT IN AERS: 2.0000 | INHALATION_SPRAY | Freq: Four times a day (QID) | RESPIRATORY_TRACT | 0 refills | Status: DC | PRN

## 2018-05-13 NOTE — Telephone Encounter (Signed)
Scheduled virtual visit with Dr. Juleen China for 05/17/18 @ 2:40 PM.

## 2018-05-13 NOTE — Telephone Encounter (Signed)
Last OV 10/26/2017 Last refill 01/19/2018 #1 inhaler/3 Next OV not scheduled

## 2018-05-14 ENCOUNTER — Telehealth: Payer: Self-pay | Admitting: *Deleted

## 2018-05-14 NOTE — Telephone Encounter (Signed)
SPOKE WITH PT CONSENTED TO MY CHART EVISIT Tricia [name]

## 2018-05-16 NOTE — Progress Notes (Addendum)
Virtual Visit via Video   I connected with Tricia [name] by a video enabled telemedicine application and verified that I am speaking with the correct person using two identifiers. Location patient: Home Location provider: [facility], Office Persons participating in the virtual visit: TINA GRUNER, Briscoe Deutscher, DO   I discussed the limitations of evaluation and management by telemedicine and the availability of in person appointments. The patient expressed understanding and agreed to proceed.  Subjective:   Shortness of Breath  This is a chronic problem. Associated symptoms include leg swelling, rhinorrhea and wheezing. Pertinent negatives include no chest pain, coryza, fever, headaches, hemoptysis, leg pain, neck pain, sore throat, sputum production, swollen glands or syncope. The symptoms are aggravated by pollens, weather changes and any activity. Her past medical history is significant for allergies, COPD and a heart failure.   ROS: See pertinent positives and negatives per HPI.  Patient Active Problem List   Diagnosis Date Noted  . PVC's (premature ventricular contractions) 04/16/2017  . Hyperparathyroidism, secondary ([place]) 07/06/2016  . Right renal atrophy 04/19/2016  . COPD (chronic obstructive pulmonary disease) ([place]) 04/19/2016  . Aortic atherosclerosis ([place]) 04/19/2016  . Atherosclerosis of arteries 04/19/2016  . Sleep apnea, not on CPAP   . Diverticulosis   . CAD (coronary artery disease) 03/10/2016  . Rib pain - s/p CPR 10/08/2015  . History of cardiac arrest 10/01/2015  . History of ST elevation myocardial infarction (STEMI) 10/01/2015  . Second hand smoke exposure 07/28/2015  . Postoperative malabsorption 07/27/2015  . Recurrent UTI 03/14/2014  . GERD (gastroesophageal reflux disease) 02/14/2014  . History of gastric bypass 02/14/2014  . Former smoker 02/14/2014  . Diabetes mellitus type II, controlled ([place]) 02/14/2014  . Anemia, B12 deficiency 04/04/2009    . Osteoporosis 12/14/2006  . Hyperlipidemia associated with type 2 diabetes mellitus ([place]) 09/22/2006  . Hypertension associated with diabetes ([place]) 09/22/2006  . Osteoarthritis 09/22/2006  . 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 Adenomatous polyp of colon 02/04/2004    Social History   Tobacco Use  . Smoking status: Former Smoker    Packs/day: 2.00    Years: 41.00    Pack years: 82.00    Types: Cigarettes    Last attempt to quit: 03/06/2012    Years since quitting: 6.2  . Smokeless tobacco: Never Used  Substance Use Topics  . Alcohol use: No    Alcohol/week: 0.0 standard drinks    Current Outpatient Medications:  .  albuterol (PROVENTIL HFA;VENTOLIN HFA) 108 (90 Base) MCG/ACT inhaler, Inhale 2 puffs into the lungs every 6 (six) hours as needed for wheezing or shortness of breath., Disp: 3 Inhaler, Rfl: 0 .  amLODipine (NORVASC) 5 MG tablet, Take 1 tablet (5 mg total) by mouth daily., Disp: 90 tablet, Rfl: 3 .  aspirin EC 81 MG tablet, Take 81 mg by mouth daily., Disp: , Rfl:  .  atorvastatin (LIPITOR) 80 MG tablet, Take 1 tablet (80 mg total) by mouth daily., Disp: 90 tablet, Rfl: 2 .  baclofen (LIORESAL) 10 MG tablet, Take 1 tablet (10 mg total) by mouth 2 (two) times daily., Disp: 60 tablet, Rfl: 0 .  Biotin 10000 MCG TABS, Take 10,000 mcg by mouth daily. Dissolvable tablets, Disp: , Rfl:  .  blood glucose meter kit and supplies KIT, Dispense based on patient and insurance preference. Use to test daily. (FOR ICD-9 250.00, 250.01). 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  budesonide-formoterol (SYMBICORT) 160-4.5 MCG/ACT  inhaler, Inhale 2 puffs into the lungs 2 (two) times daily., Disp: 1 Inhaler, Rfl: 3 .  carvedilol (COREG) 3.125 MG tablet, Pt to take 3.125 mg with the carvedilol 6.25 mg  for a total of 9.375  Twice a day., Disp: 180 tablet, Rfl: 3 .  carvedilol (COREG) 6.25 MG tablet, Pt to take carvedilol 6.25 mg with the carvedilol 3.125 mg for a  total of 9.375 mg twice da day., Disp: 180 tablet, Rfl: 3 .  clopidogrel (PLAVIX) 75 MG tablet, Take 1 tablet (75 mg total) by mouth daily., Disp: 90 tablet, Rfl: 1 .  denosumab (PROLIA) 60 MG/ML SOSY injection, Inject 60 mg into the skin every 6 (six) months., Disp: , Rfl:  .  furosemide (LASIX) 20 MG tablet, Take 1 tablet (20 mg total) by mouth daily., Disp: 30 tablet, Rfl: 3 .  gabapentin (NEURONTIN) 100 MG capsule, TAKE 1 CAPSULE BY MOUTH THREE TIMES A DAY, Disp: 270 capsule, Rfl: 0 .  glucose blood (FREESTYLE LITE) test strip, TEST UP TO FOUR TIMES DAILY Dx: E11.22, Disp: 360 each, Rfl: 5 .  HYDROcodone-acetaminophen (NORCO) 10-325 MG tablet, Take 1 tablet by mouth every 8 (eight) hours as needed., Disp: 30 tablet, Rfl: 0 .  Lancets (FREESTYLE) lancets, TEST UP TO FOUR TIMES DAILY Dx: E11.22, Disp: 360 each, Rfl: 5 .  nitroGLYCERIN (NITROLINGUAL) 0.4 MG/SPRAY spray, Place 1 spray under the tongue every 5 (five) minutes x 3 doses as needed for chest pain., Disp: 4.9 g, Rfl: 3 .  pantoprazole (PROTONIX) 40 MG tablet, Take 1 tablet (40 mg total) by mouth 2 (two) times daily before a meal., Disp: 60 tablet, Rfl: 11 .  potassium chloride (K-DUR) 10 MEQ tablet, TAKE 1 TABLET (10 MEQ TOTAL) BY MOUTH 2 (TWO) TIMES DAILY., Disp: 180 tablet, Rfl: 1 .  valsartan (DIOVAN) 320 MG tablet, Take 1 tablet (320 mg total) by mouth daily., Disp: 90 tablet, Rfl: 2  Allergies  Allergen Reactions  . Sulfa Antibiotics Diarrhea and Nausea And Vomiting   Objective:   VITALS: Per patient if applicable, see vitals. GENERAL: Alert, appears well and in no acute distress. HEENT: Atraumatic, conjunctiva clear, no obvious abnormalities on inspection of external nose and ears. NECK: Normal movements of the head and neck. CARDIOPULMONARY: No increased WOB. Speaking in clear sentences. I:E ratio WNL.  MS: Moves all visible extremities without noticeable abnormality. PSYCH: Pleasant and cooperative, well-groomed. Speech  normal rate and rhythm. Affect is appropriate. Insight and judgement are appropriate. Attention is focused, linear, and appropriate.  NEURO: CN grossly intact. Oriented as arrived to appointment on time with no prompting. Moves both UE equally.  SKIN: No obvious lesions, wounds, erythema, or cyanosis noted on face or hands.  Assessment and Plan:   Diagnoses and all orders for this visit:  Wheeze Comments: DDx includes COPD, seasonal allergies, GERD, edema. Discussed all and will treat in stepwise manner.  Orders: -     budesonide-formoterol (SYMBICORT) 160-4.5 MCG/ACT inhaler; Inhale 2 puffs into the lungs 2 (two) times daily.  Panlobular emphysema ([facility]) -     budesonide-formoterol (SYMBICORT) 160-4.5 MCG/ACT inhaler; Inhale 2 puffs into the lungs 2 (two) times daily.  Former smoker  Gastroesophageal reflux disease without esophagitis  Seasonal allergies  Lower extremity edema -     furosemide (LASIX) 20 MG tablet; Take 1 tablet (20 mg total) by mouth daily.  Records requested if needed. Time spent: 25 minutes, of which >50% was spent in obtaining information about her symptoms, reviewing her  previous labs, evaluations, and treatments, counseling her about her condition (please see the discussed topics above), and developing a plan to further investigate it; she had a number of questions which I addressed.   . Reviewed expectations re: course of current medical issues. . Discussed self-management of symptoms. . Outlined signs and symptoms indicating need for more acute intervention. . Patient verbalized understanding and all questions were answered. Marland Kitchen Health Maintenance issues including appropriate healthy diet, exercise, and smoking avoidance were discussed with patient. . See orders for this visit as documented in the electronic medical record.  Briscoe Deutscher, DO

## 2018-05-17 ENCOUNTER — Other Ambulatory Visit: Payer: Self-pay

## 2018-05-17 ENCOUNTER — Encounter: Payer: Self-pay | Admitting: Family Medicine

## 2018-05-17 ENCOUNTER — Ambulatory Visit (INDEPENDENT_AMBULATORY_CARE_PROVIDER_SITE_OTHER): Payer: PPO | Admitting: Family Medicine

## 2018-05-17 DIAGNOSIS — J302 Other seasonal allergic rhinitis: Secondary | ICD-10-CM

## 2018-05-17 DIAGNOSIS — R062 Wheezing: Secondary | ICD-10-CM

## 2018-05-17 DIAGNOSIS — K219 Gastro-esophageal reflux disease without esophagitis: Secondary | ICD-10-CM

## 2018-05-17 DIAGNOSIS — J431 Panlobular emphysema: Secondary | ICD-10-CM | POA: Diagnosis not present

## 2018-05-17 DIAGNOSIS — Z87891 Personal history of nicotine dependence: Secondary | ICD-10-CM | POA: Diagnosis not present

## 2018-05-17 DIAGNOSIS — R600 Localized edema: Secondary | ICD-10-CM

## 2018-05-17 MED ORDER — BUDESONIDE-FORMOTEROL FUMARATE 160-4.5 MCG/ACT IN AERO: 2.0000 | INHALATION_SPRAY | Freq: Two times a day (BID) | RESPIRATORY_TRACT | 3 refills | Status: DC

## 2018-05-17 MED ORDER — FUROSEMIDE 20 MG PO TABS: 20.0000 mg | ORAL_TABLET | Freq: Every day | ORAL | 3 refills | Status: DC

## 2018-05-17 NOTE — Progress Notes (Signed)
Virtual Visit via Video Note   This visit type was conducted due to national recommendations for restrictions regarding the COVID-19 Pandemic (e.g. social distancing) in an effort to limit this patient's exposure and mitigate transmission in our community.  Due to her co-morbid illnesses, this patient is at least at moderate risk for complications without adequate follow up.  This format is felt to be most appropriate for this patient at this time.  All issues noted in this document were discussed and addressed.  A limited physical exam was performed with this format.  Please refer to the patient's chart for her consent to telehealth for [facility].   Evaluation Performed:  Follow-up visit  Date:  05/18/2018   ID:  Tricia [name], DOB 1945-10-05, MRN [id number]  Patient Location: Home Provider Location: Home  PCP:  Briscoe Deutscher, DO  Cardiologist:  Sherren Mocha, MD   Electrophysiologist:  None   Chief Complaint: Follow-up on coronary artery disease, hypertension  History of Present Illness:    Tricia [name] is a 73 y.o. female who presents via audio/video conferencing for a telehealth visit today.    She has coronary artery disease status post remote bare-metal stent to the LAD, hypertension, diabetes, hyperlipidemia, morbid obesity, COPD.  She suffered an inferior ST elevation myocardial infarction complicated by ventricular fibrillation arrest in August 2017 requiring CPR and defibrillation.  The RCA was occluded and treated with a drug-eluting stent and she underwent staged PCI of the LAD with a drug-eluting stent secondary to severe in-stent restenosis.  Of note, she was intolerant to ticagrelor secondary to shortness of breath.  Nuclear stress test in February 2018 was low risk and negative for ischemia.  She was last seen by Dr. Burt Knack in 10/19.    Today, is doing well.  She has not had chest discomfort, significant shortness of breath, paroxysmal nocturnal dyspnea or  syncope.  She has noted some increased lower extremity swelling.  She did a video visit with her primary care physician yesterday.  She had not been taking Lasix and potassium on daily basis.  This was resumed.  Her swelling does seem to be better today.  She does admit to eating more salt recently.  She also has not been exercising like she usually does given the current social distancing recommendations.  The patient does not have symptoms concerning for COVID-19 infection (fever, chills, cough, or new shortness of breath).    Past Medical History:  Diagnosis Date  . Adenomatous polyp of colon 02/2004  . Anemia   . Anxiety   . Arthritis   . B12 deficiency   . CAD (coronary artery disease)    a. s/p remote BMS to LAD;  b. 09/2015 Inf STEMI/VF Arrest: LM nl, LAD 85p (staged PCI 2 days later w/ 3.0x32 Synergy DES), 40p/m, 25d, LCX 34m RCA 80ost/100p (2.75x32 Synergy DES), 681mEF 55-65%. // c. Myoview 2/18: EF 59, no ischemia or infarction; Normal study  . Dermatophytosis of groin and perianal area   . Diet Controlled Diabetes Mellitus   . Diverticulosis   . GERD (gastroesophageal reflux disease)   . H/O echocardiogram    a. 09/2015 Echo: EF 60-65%, no rwma, mild AI, mildly dil RA, mild to mod TR.  . Marland Kitchenyperlipidemia   . Hypertensive heart disease   . Low back pain    l5 disc  . Morbid [place]  . Osteoporosis   . PVC's (premature ventricular contractions) 04/16/2017   Holter 3/19: NSR, average  heart rate 69, frequent PVCs (5% total beats), rare supraventricular ectopics, no AF/flutter  . Sleep apnea 10/03/2009   Resolved after gastric bypass    . TOBACCO ABUSE 10/05/2009   Qualifier: Diagnosis of  By: Stanford Breed, MD, Kandyce Rud   . Ventricular fibrillation ([place]) 10/01/2015   a. In setting of inferior STEMI.   Past Surgical History:  Procedure Laterality Date  . ABDOMINAL HYSTERECTOMY    . APPENDECTOMY    . BARIATRIC SURGERY    . CARDIAC CATHETERIZATION N/A 10/01/2015    Procedure: Left Heart Cath and Coronary Angiography;  Surgeon: Leonie Man, MD;  Location: [place] [facility];  Service: Cardiovascular;  Laterality: N/A;  . CARDIAC CATHETERIZATION N/A 10/01/2015   Procedure: Coronary Stent Intervention;  Surgeon: Leonie Man, MD;  Location: [place] [facility];  Service: Cardiovascular;  Laterality: N/A;  . CARDIAC CATHETERIZATION N/A 10/03/2015   Procedure: Coronary Stent Intervention;  Surgeon: Troy Sine, MD;  Location: [place] [facility];  Service: Cardiovascular;  Laterality: N/A;  . CARDIAC CATHETERIZATION N/A 10/03/2015   Procedure: Coronary/Graft Angiography;  Surgeon: Troy Sine, MD;  Location: [place] [facility];  Service: Cardiovascular;  Laterality: N/A;  . CARPAL TUNNEL RELEASE Bilateral   . CERVICAL DISC SURGERY    . CHOLECYSTECTOMY    . CORONARY ANGIOPLASTY  2002  . HERNIA REPAIR    . LUMBAR LAMINECTOMY     L3-4  . NEPHRECTOMY Right    Partial  . TONSILLECTOMY    . TOTAL KNEE ARTHROPLASTY Left   . TUBAL LIGATION    . ULNAR NERVE REPAIR Left      Current Meds  Medication Sig  . albuterol (PROVENTIL HFA;VENTOLIN HFA) 108 (90 Base) MCG/ACT inhaler Inhale 2 puffs into the lungs every 6 (six) hours as needed for wheezing or shortness of breath.  Marland Kitchen amLODipine (NORVASC) 5 MG tablet Take 1 tablet (5 mg total) by mouth daily.  Marland Kitchen aspirin EC 81 MG tablet Take 81 mg by mouth daily.  Marland Kitchen atorvastatin (LIPITOR) 80 MG tablet Take 1 tablet (80 mg total) by mouth daily.  . baclofen (LIORESAL) 10 MG tablet Take 1 tablet (10 mg total) by mouth 2 (two) times daily.  . Biotin 10000 MCG TABS Take 10,000 mcg by mouth daily. Dissolvable tablets  . blood glucose meter kit and supplies KIT Dispense based on patient and insurance preference. Use to test daily. (FOR ICD-9 250.00, 250.01). Dx Code:E11.22 N18.1  . budesonide-formoterol (SYMBICORT) 160-4.5 MCG/ACT inhaler Inhale 2 puffs into the lungs 2 (two) times daily.  . carvedilol (COREG) 3.125 MG  tablet Pt to take 3.125 mg with the carvedilol 6.25 mg  for a total of 9.375  Twice a day.  . carvedilol (COREG) 6.25 MG tablet Pt to take carvedilol 6.25 mg with the carvedilol 3.125 mg for a total of 9.375 mg twice da day.  . clopidogrel (PLAVIX) 75 MG tablet Take 1 tablet (75 mg total) by mouth daily.  Marland Kitchen denosumab (PROLIA) 60 MG/ML SOSY injection Inject 60 mg into the skin every 6 (six) months.  . furosemide (LASIX) 20 MG tablet Take 1 tablet (20 mg total) by mouth daily.  Marland Kitchen gabapentin (NEURONTIN) 100 MG capsule TAKE 1 CAPSULE BY MOUTH THREE TIMES A DAY  . glucose blood (FREESTYLE LITE) test strip TEST UP TO FOUR TIMES DAILY Dx: E11.22  . HYDROcodone-acetaminophen (NORCO) 10-325 MG tablet Take 1 tablet by mouth every 8 (eight) hours as needed.  . Lancets (FREESTYLE) lancets  TEST UP TO FOUR TIMES DAILY Dx: E11.22  . nitroGLYCERIN (NITROLINGUAL) 0.4 MG/SPRAY spray Place 1 spray under the tongue every 5 (five) minutes x 3 doses as needed for chest pain.  . pantoprazole (PROTONIX) 40 MG tablet Take 1 tablet (40 mg total) by mouth 2 (two) times daily before a meal.  . potassium chloride (K-DUR) 10 MEQ tablet TAKE 1 TABLET (10 MEQ TOTAL) BY MOUTH 2 (TWO) TIMES DAILY.  . valsartan (DIOVAN) 320 MG tablet Take 1 tablet (320 mg total) by mouth daily.     Allergies:   Sulfa antibiotics   Social History   Tobacco Use  . Smoking status: Former Smoker    Packs/day: 2.00    Years: 41.00    Pack years: 82.00    Types: Cigarettes    Last attempt to quit: 03/06/2012    Years since quitting: 6.2  . Smokeless tobacco: Never Used  Substance Use Topics  . Alcohol use: No    Alcohol/week: 0.0 standard drinks  . Drug use: No     Family Hx: The patient's family history includes Colitis in her mother; Heart disease in her father; Lung cancer in her maternal uncle and maternal uncle; Stomach cancer in her maternal aunt. There is no history of Colon cancer.  ROS:   Please see the history of present  illness.      All other systems reviewed and are negative.   Prior CV studies:   The following studies were reviewed today:  AAA [place] 04/30/17 IMPRESSION: Negative for abdominal aortic aneurysm.  24 Hr Holter 04/08/17 The basic rhythm is normal sinus with an average heart rate of 69 bpm There are no bradyarrhythmias There are frequent PVC's approximately 5% of total beats There are rare supraventricular ectopics No atrial fibrillation or flutter  Nuclear stress test 03/25/16 EF 59, no ischemia or scar, normal study   LHC 10/03/15 LAD prox and mid stent 85% ISR, dist stent ok with 25% ISR LCx prox 40%, dist 60% RCA ostial stent ok, prox stent ok, mid 65% and 20% PCI Angiosculpt scoring balloon and 3 x 32 mm Synergy DES to prox and mid LAD   Echo 10/02/15 EF 60-65%, normal wall motion, mild AI, mild RAE, mild to moderate TR   LHC 10/01/15 RCA ostial 80% and proximal 100%, mid 65% LCx prox 40% LAD prox stent 40% ISR, 85% ISR, dist stent ok with 25% ISR EF 55-65% PCI: 2.75 x 32 mm Synergy DES to ost/prox RCA   Labs/Other Tests and Data Reviewed:    EKG:  ECG strip from Apple Watch device personally reviewed; NSR, HR 62, no PVCs     Recent Labs: 07/03/2017: ALT 10; BUN 22; Creatinine, Ser 0.96; Hemoglobin 12.7; Platelets 224.0; Potassium 4.7; Sodium 142   Recent Lipid Panel Lab Results  Component Value Date/Time   CHOL 162 07/03/2017 08:24 AM   CHOL 125 03/05/2016 09:48 AM   TRIG 115.0 07/03/2017 08:24 AM   TRIG 122 12/11/2005 10:28 AM   HDL 48.60 07/03/2017 08:24 AM   HDL 45 03/05/2016 09:48 AM   CHOLHDL 3 07/03/2017 08:24 AM   LDLCALC 91 07/03/2017 08:24 AM   LDLCALC 63 03/05/2016 09:48 AM   LDLDIRECT 86.0 09/11/2015 11:04 AM   From [employer] Tool    Wt Readings from Last 3 Encounters:  05/18/18 221 lb (100.2 kg)  11/18/17 221 lb 12.8 oz (100.6 kg)  11/03/17 223 lb 3.2 oz (101.2 kg)     Objective:    Vital  Signs:  BP (!) 188/84   Pulse 64   Ht '5\' 2"'  (1.575 m)    Wt 221 lb (100.2 kg)   SpO2 96%   BMI 40.42 kg/m    GEN:  During our conversation, the patient does not seem to be in acute distress.    CV:  + Lower ext edema bilat RESP:  Respiratory effort seems to be normal.    NEURO:  Alert and Oriented.    PSYCH:  The patient is in good spirits.      ASSESSMENT & PLAN:    Coronary artery disease involving native coronary artery of native heart without angina pectoris History of inferior ST elevation myocardial infarction in August 2017 treated with drug-eluting stent to the RCA and staged PCI of the LAD with a drug-eluting stent.  Nuclear stress test in February 2018 was low risk without ischemia and normal EF.    She has not had recurrent angina.  Continue aspirin, atorvastatin, carvedilol, clopidogrel.  Essential hypertension Blood pressure markedly elevated today.  She just took her blood pressure medications prior to the video visit.  She does have some lower extremity swelling which seems to be dependent.  I suspect that this is related to dietary indiscretion with salt.  Her primary care physician placed her back on Lasix and potassium daily yesterday.  I have advised her to take an extra amlodipine 5 mg if her blood pressure is still greater than 170 in 2 to 3 hours.  I have asked her to track her blood pressure over the next couple of weeks.  She was sent me those readings through my chart.  If her pressure remains above target, consider increasing amlodipine to 10 mg daily.  Panlobular emphysema ([place]) She has had some increased wheezing recently.  Her primary care physician recently placed her on Symbicort.  She does not have any symptoms to suggest volume overload.  Mixed hyperlipidemia Continue statin therapy.  PVC's (premature ventricular contractions) 5% PVCs on Holter in March 2019.  Apple watch strip reviewed today.  No PVCs noted.  Continue beta-blocker.  COVID-19 Education: The signs and symptoms of COVID-19 were discussed with  the patient and how to seek care for testing (follow up with PCP or arrange E-visit).  The importance of social distancing was discussed today.  Time:   Today, I have spent 27 minutes with the patient with telehealth technology discussing the above problems.     Medication Adjustments/Labs and Tests Ordered: Current medicines are reviewed at length with the patient today.  Concerns regarding medicines are outlined above.   Tests Ordered: No orders of the defined types were placed in this encounter.   Medication Changes: No orders of the defined types were placed in this encounter.   Disposition:  Follow up in 6 month(s)  Signed, Richardson Dopp, PA-C  05/18/2018 2:18 PM    [facility]

## 2018-05-18 ENCOUNTER — Telehealth (INDEPENDENT_AMBULATORY_CARE_PROVIDER_SITE_OTHER): Payer: PPO | Admitting: Physician Assistant

## 2018-05-18 ENCOUNTER — Encounter: Payer: Self-pay | Admitting: Physician Assistant

## 2018-05-18 VITALS — BP 188/84 | HR 64 | Ht 62.0 in | Wt 221.0 lb

## 2018-05-18 DIAGNOSIS — I2510 Atherosclerotic heart disease of native coronary artery without angina pectoris: Secondary | ICD-10-CM

## 2018-05-18 DIAGNOSIS — J431 Panlobular emphysema: Secondary | ICD-10-CM | POA: Diagnosis not present

## 2018-05-18 DIAGNOSIS — E782 Mixed hyperlipidemia: Secondary | ICD-10-CM

## 2018-05-18 DIAGNOSIS — I493 Ventricular premature depolarization: Secondary | ICD-10-CM

## 2018-05-18 DIAGNOSIS — I10 Essential (primary) hypertension: Secondary | ICD-10-CM

## 2018-05-18 DIAGNOSIS — Z7189 Other specified counseling: Secondary | ICD-10-CM | POA: Diagnosis not present

## 2018-05-18 NOTE — Patient Instructions (Addendum)
Medication Instructions:  No changes  If you need a refill on your cardiac medications before your next appointment, please call your pharmacy.   Lab work: None   If you have labs (blood work) drawn today and your tests are completely normal, you will receive your results only by: Marland Kitchen MyChart Message (if you have MyChart) OR . A paper copy in the mail If you have any lab test that is abnormal or we need to change your treatment, we will call you to review the results.  Testing/Procedures: None   Follow-Up: At [facility], you and your health needs are our priority.  As part of our continuing mission to provide you with exceptional heart care, we have created designated Provider Care Teams.  These Care Teams include your primary Cardiologist (physician) and Advanced Practice Providers (APPs -  Physician Assistants and Nurse Practitioners) who all work together to provide you with the care you need, when you need it. You will need a follow up appointment in:  6 months.  Please call our office 2 months in advance to schedule this appointment.  You may see Sherren Mocha, MD or Richardson Dopp, PA-C  Any Other Special Instructions Will Be Listed Below (If Applicable). If you top number on your blood pressure in 2-3 hours is > 170, take an extra Amlodipine 5 mg. Limit salt Try to walk as much as you can given the current limitations Check you blood pressure 1-2 times a day and send me those readings through MyChart in 2 weeks Call us if your swelling gets worse or if you start having shortness of breath

## 2018-05-18 NOTE — Telephone Encounter (Signed)
Tele strip reviewed.  Demonstrates normal sinus rhythm. Richardson Dopp, PA-C    05/18/2018 3:35 PM

## 2018-05-18 NOTE — Telephone Encounter (Signed)
fyi

## 2018-05-23 ENCOUNTER — Encounter: Payer: Self-pay | Admitting: Family Medicine

## 2018-05-23 NOTE — Patient Instructions (Addendum)
1. Inhalers, Lasix 2. Antihistamines, Flonase 3. Increase GERD medication

## 2018-05-26 ENCOUNTER — Telehealth: Payer: Self-pay | Admitting: Family Medicine

## 2018-05-26 NOTE — Telephone Encounter (Signed)
Change to 160 and take two if possible.

## 2018-05-26 NOTE — Telephone Encounter (Signed)
See note

## 2018-05-26 NOTE — Telephone Encounter (Signed)
Copied from [place] [id number]. Topic: Quick Communication - Rx Refill/Question >> May 26, 2018  9:39 AM Margot Ables wrote: Medication: valsartan (DIOVAN) 320 MG tablet - medication is currently on backorder and pt has requested a change in medication (90 day RX please - mail order)  Has the patient contacted their pharmacy? Yes - pharmacy calling - pt has about 9 days of medication on hand  Preferred Pharmacy (with phone number or street name): [place], [place] [phone] (Phone) [phone] (Fax)

## 2018-05-26 NOTE — Telephone Encounter (Signed)
What can we change to?

## 2018-05-27 ENCOUNTER — Other Ambulatory Visit: Payer: Self-pay | Admitting: Physician Assistant

## 2018-05-27 ENCOUNTER — Other Ambulatory Visit: Payer: Self-pay | Admitting: Family Medicine

## 2018-05-27 ENCOUNTER — Other Ambulatory Visit: Payer: Self-pay | Admitting: *Deleted

## 2018-05-27 DIAGNOSIS — G8929 Other chronic pain: Secondary | ICD-10-CM

## 2018-05-27 DIAGNOSIS — M5441 Lumbago with sciatica, right side: Principal | ICD-10-CM

## 2018-05-27 MED ORDER — VALSARTAN 160 MG PO TABS: 320.0000 mg | ORAL_TABLET | Freq: Every day | ORAL | 0 refills | Status: DC

## 2018-05-27 NOTE — Telephone Encounter (Signed)
Last OV 05/17/18 Last refill Gabapentin 02/26/18 #270/0                 Baclofen 04/27/18 #60/0 Next OV not scheduled

## 2018-05-27 NOTE — Telephone Encounter (Signed)
Rx sent to [facility]. Called pt and advised, also advised of new doing instructions.

## 2018-06-07 ENCOUNTER — Other Ambulatory Visit: Payer: Self-pay

## 2018-06-07 ENCOUNTER — Encounter: Payer: Self-pay | Admitting: Family Medicine

## 2018-06-07 MED ORDER — DOXYCYCLINE HYCLATE 100 MG PO TABS: 100.0000 mg | ORAL_TABLET | Freq: Two times a day (BID) | ORAL | 0 refills | Status: DC

## 2018-06-15 ENCOUNTER — Encounter: Payer: Self-pay | Admitting: Family Medicine

## 2018-07-01 ENCOUNTER — Other Ambulatory Visit: Payer: Self-pay | Admitting: Family Medicine

## 2018-07-01 DIAGNOSIS — G8929 Other chronic pain: Secondary | ICD-10-CM

## 2018-07-01 DIAGNOSIS — R600 Localized edema: Secondary | ICD-10-CM

## 2018-07-01 NOTE — Telephone Encounter (Signed)
Last OV 05/17/18 Last refill Baclofen 05/27/18 #60/0                 Potassium Chloride 01/14/18 #180/1 Next OV not scheduled

## 2018-07-21 DIAGNOSIS — E119 Type 2 diabetes mellitus without complications: Secondary | ICD-10-CM | POA: Diagnosis not present

## 2018-07-21 LAB — HM DIABETES EYE EXAM

## 2018-07-22 ENCOUNTER — Encounter: Payer: Self-pay | Admitting: Family Medicine

## 2018-07-25 ENCOUNTER — Other Ambulatory Visit: Payer: Self-pay | Admitting: Family Medicine

## 2018-07-26 ENCOUNTER — Telehealth: Payer: Self-pay | Admitting: Cardiovascular Disease

## 2018-07-26 ENCOUNTER — Encounter: Payer: Self-pay | Admitting: Family Medicine

## 2018-07-26 ENCOUNTER — Other Ambulatory Visit: Payer: Self-pay | Admitting: Family Medicine

## 2018-07-26 DIAGNOSIS — R600 Localized edema: Secondary | ICD-10-CM

## 2018-07-26 DIAGNOSIS — M5441 Lumbago with sciatica, right side: Secondary | ICD-10-CM

## 2018-07-26 DIAGNOSIS — G8929 Other chronic pain: Secondary | ICD-10-CM

## 2018-07-26 NOTE — Telephone Encounter (Signed)
Change the Lasix and potassium to take them only on every Mon, Wed, Fri. Take extra Lasix and potassium if increased leg swelling. Send some BP readings in 2 weeks. Richardson Dopp, PA-C    07/26/2018 5:01 PM

## 2018-07-26 NOTE — Telephone Encounter (Signed)
° °  Pt c/o BP issue: STAT if pt c/o blurred vision, one-sided weakness or slurred speech  1. What are your last 5 BP readings?   Monday 9 AM 118/61 HR 56 Sunday 5 PM 110/52 HR 60 Sunday 9 AM 117/56 HR 53  Saturday 4 PM 121/49 HR 56  Saturday 8 AM 133/60 62   2. Are you having any other symptoms (ex. Dizziness, headache, blurred vision, passed out)? Blurry vision when driving or moving around. Patient reports clear vision when sitting talking on the phone or watching TV  3. What is your BP issue? Patient is worried about her bottom number being so low. She wants to know if maybe her medication needed adjusted.She is constantly fatigued, and often takes unexpected naps for ~45 minutes.  Always higher first thing in the AM before taking meds. Weight is same as at the time of her last virtual visit with PA, but BP was higer then. She has reported eating more protein

## 2018-07-27 ENCOUNTER — Encounter (HOSPITAL_BASED_OUTPATIENT_CLINIC_OR_DEPARTMENT_OTHER): Payer: Self-pay

## 2018-07-27 ENCOUNTER — Emergency Department (HOSPITAL_BASED_OUTPATIENT_CLINIC_OR_DEPARTMENT_OTHER)
Admission: EM | Admit: 2018-07-27 | Discharge: 2018-07-27 | Disposition: A | Discharge status: Home / Self Care | Payer: PPO | Attending: Emergency Medicine | Admitting: Emergency Medicine

## 2018-07-27 ENCOUNTER — Other Ambulatory Visit: Payer: Self-pay

## 2018-07-27 ENCOUNTER — Telehealth: Payer: Self-pay | Admitting: Cardiovascular Disease

## 2018-07-27 ENCOUNTER — Emergency Department (HOSPITAL_BASED_OUTPATIENT_CLINIC_OR_DEPARTMENT_OTHER): Payer: PPO

## 2018-07-27 DIAGNOSIS — Z9861 Coronary angioplasty status: Secondary | ICD-10-CM | POA: Insufficient documentation

## 2018-07-27 DIAGNOSIS — J449 Chronic obstructive pulmonary disease, unspecified: Secondary | ICD-10-CM | POA: Insufficient documentation

## 2018-07-27 DIAGNOSIS — Z9884 Bariatric surgery status: Secondary | ICD-10-CM | POA: Insufficient documentation

## 2018-07-27 DIAGNOSIS — Z9049 Acquired absence of other specified parts of digestive tract: Secondary | ICD-10-CM | POA: Diagnosis not present

## 2018-07-27 DIAGNOSIS — Z79899 Other long term (current) drug therapy: Secondary | ICD-10-CM | POA: Insufficient documentation

## 2018-07-27 DIAGNOSIS — I10 Essential (primary) hypertension: Secondary | ICD-10-CM | POA: Insufficient documentation

## 2018-07-27 DIAGNOSIS — D649 Anemia, unspecified: Secondary | ICD-10-CM | POA: Diagnosis not present

## 2018-07-27 DIAGNOSIS — Z20828 Contact with and (suspected) exposure to other viral communicable diseases: Secondary | ICD-10-CM | POA: Diagnosis not present

## 2018-07-27 DIAGNOSIS — I2510 Atherosclerotic heart disease of native coronary artery without angina pectoris: Secondary | ICD-10-CM | POA: Diagnosis not present

## 2018-07-27 DIAGNOSIS — R531 Weakness: Secondary | ICD-10-CM | POA: Diagnosis present

## 2018-07-27 DIAGNOSIS — N3000 Acute cystitis without hematuria: Secondary | ICD-10-CM

## 2018-07-27 DIAGNOSIS — E119 Type 2 diabetes mellitus without complications: Secondary | ICD-10-CM | POA: Insufficient documentation

## 2018-07-27 DIAGNOSIS — Z96652 Presence of left artificial knee joint: Secondary | ICD-10-CM | POA: Diagnosis not present

## 2018-07-27 DIAGNOSIS — N179 Acute kidney failure, unspecified: Secondary | ICD-10-CM

## 2018-07-27 DIAGNOSIS — R0602 Shortness of breath: Secondary | ICD-10-CM | POA: Insufficient documentation

## 2018-07-27 DIAGNOSIS — R7989 Other specified abnormal findings of blood chemistry: Secondary | ICD-10-CM | POA: Insufficient documentation

## 2018-07-27 DIAGNOSIS — Z87891 Personal history of nicotine dependence: Secondary | ICD-10-CM | POA: Diagnosis not present

## 2018-07-27 LAB — CBC WITH DIFFERENTIAL/PLATELET
Abs Immature Granulocytes: 0.01 10*3/uL (ref 0.00–0.07)
Basophils Absolute: 0 10*3/uL (ref 0.0–0.1)
Basophils Relative: 1 %
Eosinophils Absolute: 0.2 10*3/uL (ref 0.0–0.5)
Eosinophils Relative: 4 %
HCT: 33.8 % — ABNORMAL LOW (ref 36.0–46.0)
Hemoglobin: 10.3 g/dL — ABNORMAL LOW (ref 12.0–15.0)
Immature Granulocytes: 0 %
Lymphocytes Relative: 25 %
Lymphs Abs: 1.6 10*3/uL (ref 0.7–4.0)
MCH: 25.5 pg — ABNORMAL LOW (ref 26.0–34.0)
MCHC: 30.5 g/dL (ref 30.0–36.0)
MCV: 83.7 fL (ref 80.0–100.0)
Monocytes Absolute: 0.5 10*3/uL (ref 0.1–1.0)
Monocytes Relative: 8 %
Neutro Abs: 3.9 10*3/uL (ref 1.7–7.7)
Neutrophils Relative %: 62 %
Platelets: 215 10*3/uL (ref 150–400)
RBC: 4.04 MIL/uL (ref 3.87–5.11)
RDW: 15.1 % (ref 11.5–15.5)
WBC: 6.2 10*3/uL (ref 4.0–10.5)
nRBC: 0 % (ref 0.0–0.2)

## 2018-07-27 LAB — URINALYSIS, ROUTINE W REFLEX MICROSCOPIC
Bilirubin Urine: NEGATIVE
Glucose, UA: NEGATIVE mg/dL
Hgb urine dipstick: NEGATIVE
Ketones, ur: NEGATIVE mg/dL
Nitrite: NEGATIVE
Protein, ur: NEGATIVE mg/dL
Specific Gravity, Urine: 1.02 (ref 1.005–1.030)
pH: 5.5 (ref 5.0–8.0)

## 2018-07-27 LAB — BASIC METABOLIC PANEL
Anion gap: 8 (ref 5–15)
BUN: 71 mg/dL — ABNORMAL HIGH (ref 8–23)
CO2: 21 mmol/L — ABNORMAL LOW (ref 22–32)
Calcium: 9.5 mg/dL (ref 8.9–10.3)
Chloride: 113 mmol/L — ABNORMAL HIGH (ref 98–111)
Creatinine, Ser: 1.37 mg/dL — ABNORMAL HIGH (ref 0.44–1.00)
GFR calc Af Amer: 45 mL/min — ABNORMAL LOW (ref >60)
GFR calc non Af Amer: 39 mL/min — ABNORMAL LOW (ref >60)
Glucose, Bld: 126 mg/dL — ABNORMAL HIGH (ref 70–99)
Potassium: 4.6 mmol/L (ref 3.5–5.1)
Sodium: 142 mmol/L (ref 135–145)

## 2018-07-27 LAB — URINALYSIS, MICROSCOPIC (REFLEX)

## 2018-07-27 LAB — TROPONIN I (HIGH SENSITIVITY): Troponin I (High Sensitivity): 2 ng/L (ref <18)

## 2018-07-27 LAB — BRAIN NATRIURETIC PEPTIDE: B Natriuretic Peptide: 65.8 pg/mL (ref 0.0–100.0)

## 2018-07-27 LAB — OCCULT BLOOD X 1 CARD TO LAB, STOOL: Fecal Occult Bld: NEGATIVE

## 2018-07-27 MED ORDER — CEPHALEXIN 500 MG PO CAPS: 500.0000 mg | ORAL_CAPSULE | Freq: Four times a day (QID) | ORAL | 0 refills | Status: DC

## 2018-07-27 MED ORDER — SODIUM CHLORIDE 0.9 % IV BOLUS
1000.0000 mL | Freq: Once | INTRAVENOUS | Status: AC
  Administered 2018-07-27: 1000 mL via INTRAVENOUS

## 2018-07-27 NOTE — ED Provider Notes (Signed)
[facility] EMERGENCY DEPARTMENT Provider Note   CSN: [id number] Arrival date & time: 07/27/18  1425    History   Chief Complaint No chief complaint on file.   HPI Tricia [name] is a 73 y.o. female with PMHx CAD, GERD, HTN, HLD, COPD, and DM that is diet controlled who presents to the ED with complaints of hypotension and generalized weakness x "a couple of weeks." Pt reports that her diastolic blood pressure has been dropping into the 50's which has concerned her. She attempted to contact her cardiologist last week but did not hear anything back yet. This morning when she woke up patient reports feeling so weak all over and when she checked her BP it was 124/49; the lowest it has been is 98/50. Pt also states that her son has been concerned that she has had worsening shortness of breath with exertion lately but patient has not noticed this. She does note a tickling in her throat that has been making her cough; pt has been using cough drops with relief. No known covid 19 exposures recently. Denies fever, chills, chest pain, unilateral leg swelling, abdominal pain, nausea, vomiting, speech difficulties, headache, unilateral weakness or numbness.        Past Medical History:  Diagnosis Date  . Adenomatous polyp of colon 02/2004  . Anemia   . Anxiety   . Arthritis   . B12 deficiency   . CAD (coronary artery disease)    a. s/p remote BMS to LAD;  b. 09/2015 Inf STEMI/VF Arrest: LM nl, LAD 85p (staged PCI 2 days later w/ 3.0x32 Synergy DES), 40p/m, 25d, LCX 66m RCA 80ost/100p (2.75x32 Synergy DES), 622mEF 55-65%. // c. Myoview 2/18: EF 59, no ischemia or infarction; Normal study  . Dermatophytosis of groin and perianal area   . Diet Controlled Diabetes Mellitus   . Diverticulosis   . GERD (gastroesophageal reflux disease)   . H/O echocardiogram    a. 09/2015 Echo: EF 60-65%, no rwma, mild AI, mildly dil RA, mild to mod TR.  . Marland Kitchenyperlipidemia   . Hypertensive heart disease   .  Low back pain    l5 disc  . Morbid obesity (HCWyeville  . Osteoporosis   . PVC's (premature ventricular contractions) 04/16/2017   Holter 3/19: NSR, average heart rate 69, frequent PVCs (5% total beats), rare supraventricular ectopics, no AF/flutter  . Sleep apnea 10/03/2009   Resolved after gastric bypass    . TOBACCO ABUSE 10/05/2009   Qualifier: Diagnosis of  By: CrStanford BreedMD, FAKandyce Rud . Ventricular fibrillation (HCMontara08/28/2017   a. In setting of inferior STEMI.    Patient Active Problem List   Diagnosis Date Noted  . PVC's (premature ventricular contractions) 04/16/2017  . Hyperparathyroidism, secondary (HCLodi06/04/2016  . Right renal atrophy 04/19/2016  . COPD (chronic obstructive pulmonary disease) (HCSun City West03/17/2018  . Aortic atherosclerosis (HCCrenshaw03/17/2018  . Atherosclerosis of arteries 04/19/2016  . Sleep apnea, not on CPAP   . Diverticulosis   . CAD (coronary artery disease) 03/10/2016  . Rib pain - s/p CPR 10/08/2015  . History of cardiac arrest 10/01/2015  . History of ST elevation myocardial infarction (STEMI) 10/01/2015  . Second hand smoke exposure 07/28/2015  . Postoperative malabsorption 07/27/2015  . Recurrent UTI 03/14/2014  . GERD (gastroesophageal reflux disease) 02/14/2014  . History of gastric bypass 02/14/2014  . Former smoker 02/14/2014  . Diabetes mellitus type II, controlled (HCSan Bernardino01/01/2015  . Anemia, B12 deficiency 04/04/2009  .  Osteoporosis 12/14/2006  . Hyperlipidemia associated with type 2 diabetes mellitus ([place]) 09/22/2006  . Hypertension associated with diabetes ([place]) 09/22/2006  . Osteoarthritis 09/22/2006  . 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 Adenomatous polyp of colon 02/04/2004    Past Surgical History:  Procedure Laterality Date  . ABDOMINAL HYSTERECTOMY    . APPENDECTOMY    . BARIATRIC SURGERY    . CARDIAC CATHETERIZATION N/A 10/01/2015   Procedure: Left Heart Cath and Coronary Angiography;   Surgeon: Leonie Man, MD;  Location: [facility];  Service: Cardiovascular;  Laterality: N/A;  . CARDIAC CATHETERIZATION N/A 10/01/2015   Procedure: Coronary Stent Intervention;  Surgeon: Leonie Man, MD;  Location: [facility];  Service: Cardiovascular;  Laterality: N/A;  . CARDIAC CATHETERIZATION N/A 10/03/2015   Procedure: Coronary Stent Intervention;  Surgeon: Troy Sine, MD;  Location: [facility];  Service: Cardiovascular;  Laterality: N/A;  . CARDIAC CATHETERIZATION N/A 10/03/2015   Procedure: Coronary/Graft Angiography;  Surgeon: Troy Sine, MD;  Location: [facility];  Service: Cardiovascular;  Laterality: N/A;  . CARPAL TUNNEL RELEASE Bilateral   . CERVICAL DISC SURGERY    . CHOLECYSTECTOMY    . CORONARY ANGIOPLASTY  2002  . HERNIA REPAIR    . LUMBAR LAMINECTOMY     L3-4  . NEPHRECTOMY Right    Partial  . TONSILLECTOMY    . TOTAL KNEE ARTHROPLASTY Left   . TUBAL LIGATION    . ULNAR NERVE REPAIR Left      OB History   No obstetric history on file.      Home Medications    Prior to Admission medications   Medication Sig Start Date End Date Taking? Authorizing Provider  albuterol (PROVENTIL HFA;VENTOLIN HFA) 108 (90 Base) MCG/ACT inhaler Inhale 2 puffs into the lungs every 6 (six) hours as needed for wheezing or shortness of breath. 05/13/18   Briscoe Deutscher, DO  amLODipine (NORVASC) 5 MG tablet Take 1 tablet (5 mg total) by mouth daily. 05/27/18   Deboraha Sprang, MD  aspirin EC 81 MG tablet Take 81 mg by mouth daily.    [provider]  atorvastatin (LIPITOR) 80 MG tablet Take 1 tablet (80 mg total) by mouth daily. 02/26/18   Richardson Dopp T, PA-C  baclofen (LIORESAL) 10 MG tablet Take 1 tablet (10 mg total) by mouth 2 (two) times daily. 07/26/18   Briscoe Deutscher, DO  Biotin 10000 MCG TABS Take 10,000 mcg by mouth daily. Dissolvable tablets    [provider]  blood glucose meter kit and supplies KIT Dispense based  on patient and insurance preference. Use to test daily. (FOR ICD-9 250.00, 250.01). Dx Code:E11.22 N18.1 01/21/16   Marin Olp, MD  budesonide-formoterol [facility]) 160-4.5 MCG/ACT inhaler Inhale 2 puffs into the lungs 2 (two) times daily. 05/17/18   Briscoe Deutscher, DO  carvedilol (COREG) 3.125 MG tablet Pt to take 3.125 mg with the carvedilol 6.25 mg for a total of 9.375 Twice a day. 05/27/18   Deboraha Sprang, MD  carvedilol (COREG) 6.25 MG tablet Pt to take carvedilol 6.25 mg with the carvedilol 3.125 mg for a total of 9.375 mg twice da day. 05/27/18   Deboraha Sprang, MD  cephALEXin (KEFLEX) 500 MG capsule Take 1 capsule (500 mg total) by mouth 4 (four) times daily. 07/27/18   Eustaquio Maize, PA-C  clopidogrel (PLAVIX) 75 MG tablet Take 1 tablet (75 mg total) by mouth  daily. 03/30/18   Sherren Mocha, MD  denosumab (PROLIA) 60 MG/ML SOSY injection Inject 60 mg into the skin every 6 (six) months.    [provider]  doxycycline (VIBRA-TABS) 100 MG tablet Take 1 tablet (100 mg total) by mouth 2 (two) times daily. 06/07/18   Briscoe Deutscher, DO  furosemide (LASIX) 20 MG tablet Take 1 tablet by mouth daily. 07/26/18   Briscoe Deutscher, DO  gabapentin (NEURONTIN) 100 MG capsule TAKE 1 CAPSULE BY MOUTH THREE TIMES A DAY 05/27/18   Briscoe Deutscher, DO  glucose blood (FREESTYLE LITE) test strip TEST UP TO FOUR TIMES DAILY Dx: E11.22 01/19/18   Briscoe Deutscher, DO  HYDROcodone-acetaminophen (NORCO) 10-325 MG tablet Take 1 tablet by mouth every 8 (eight) hours as needed. 04/21/18   Briscoe Deutscher, DO  Lancets (FREESTYLE) lancets TEST UP TO FOUR TIMES DAILY Dx: E11.22 04/08/16   Briscoe Deutscher, DO  nitroGLYCERIN (NITROLINGUAL) 0.4 MG/SPRAY spray Place 1 spray under the tongue every 5 (five) minutes x 3 doses as needed for chest pain. 04/03/17   Richardson Dopp T, PA-C  pantoprazole (PROTONIX) 40 MG tablet Take 1 tablet (40 mg total) by mouth 2 (two) times daily before a meal. 11/03/17   Ladene Artist, MD   potassium chloride (K-DUR) 10 MEQ tablet Take 1 tablet by mouth twice daily. 07/26/18   Briscoe Deutscher, DO  valsartan (DIOVAN) 160 MG tablet Take 2 tablets (320 mg total) by mouth daily. 05/27/18   Briscoe Deutscher, DO  valsartan (DIOVAN) 320 MG tablet TAKE 1 TABLET BY MOUTH EVERY DAY 07/26/18   Briscoe Deutscher, DO    Family History Family History  Problem Relation Age of Onset  . Colitis Mother        sepsis from c dif colitis  . Heart disease Father   . Lung cancer Maternal Uncle   . Stomach cancer Maternal Aunt   . Lung cancer Maternal Uncle   . Colon cancer Neg Hx     Social History Social History   Tobacco Use  . Smoking status: Former Smoker    Packs/day: 2.00    Years: 41.00    Pack years: 82.00    Types: Cigarettes    Quit date: 03/06/2012    Years since quitting: 6.3  . Smokeless tobacco: Never Used  Substance Use Topics  . Alcohol use: No    Alcohol/week: 0.0 standard drinks  . Drug use: No     Allergies   Sulfa antibiotics   Review of Systems Review of Systems  Constitutional: Negative for chills and fever.  HENT: Negative for congestion and sore throat.   Eyes: Negative for visual disturbance.  Respiratory: Positive for cough and shortness of breath.   Cardiovascular: Negative for chest pain and leg swelling.  Gastrointestinal: Negative for abdominal pain, constipation, diarrhea, nausea and vomiting.  Genitourinary: Negative for dysuria, flank pain and frequency.  Musculoskeletal: Negative for myalgias.  Skin: Negative for rash.  Neurological: Positive for weakness (generalized). Negative for seizures, syncope, speech difficulty, numbness and headaches.     Physical Exam Updated Vital Signs BP (!) 161/73 (BP Location: Left Arm)   Pulse 74   Temp 98.3 F (36.8 C) (Oral)   Resp 20   Ht '5\' 2"'  (1.575 m)   Wt 103.4 kg   SpO2 98%   BMI 41.70 kg/m   Physical Exam Vitals signs and nursing note reviewed.  Constitutional:      Appearance: She is  obese. She is not ill-appearing.  HENT:  Head: Normocephalic and atraumatic.     Mouth/Throat:     Mouth: Mucous membranes are moist.     Pharynx: No oropharyngeal exudate or posterior oropharyngeal erythema.  Eyes:     Extraocular Movements: Extraocular movements intact.     Conjunctiva/sclera: Conjunctivae normal.     Pupils: Pupils are equal, round, and reactive to light.  Neck:     Musculoskeletal: Normal range of motion and neck supple.  Cardiovascular:     Rate and Rhythm: Normal rate and regular rhythm.     Pulses: Normal pulses.  Pulmonary:     Effort: Pulmonary effort is normal. No respiratory distress.     Breath sounds: Wheezing present. No rhonchi or rales.  Abdominal:     Palpations: Abdomen is soft.     Tenderness: There is no abdominal tenderness. There is no guarding or rebound.  Musculoskeletal: Normal range of motion.  Lymphadenopathy:     Cervical: No cervical adenopathy.  Skin:    General: Skin is warm and dry.  Neurological:     Mental Status: She is alert.     Comments: CN 3-12 grossly intact A&O x4 GCS 15 Sensation and strength intact Coordination with finger-to-nose WNL Neg pronator drift       ED Treatments / Results  Labs (all labs ordered are listed, but only abnormal results are displayed) Labs Reviewed  URINALYSIS, ROUTINE W REFLEX MICROSCOPIC - Abnormal; Notable for the following components:      Result Value   Leukocytes,Ua TRACE (*)    All other components within normal limits  BASIC METABOLIC PANEL - Abnormal; Notable for the following components:   Chloride 113 (*)    CO2 21 (*)    Glucose, Bld 126 (*)    BUN 71 (*)    Creatinine, Ser 1.37 (*)    GFR calc non Af Amer 39 (*)    GFR calc Af Amer 45 (*)    All other components within normal limits  CBC WITH DIFFERENTIAL/PLATELET - Abnormal; Notable for the following components:   Hemoglobin 10.3 (*)    HCT 33.8 (*)    MCH 25.5 (*)    All other components within normal  limits  URINALYSIS, MICROSCOPIC (REFLEX) - Abnormal; Notable for the following components:   Bacteria, UA MANY (*)    All other components within normal limits  NOVEL CORONAVIRUS, NAA (HOSPITAL ORDER, SEND-OUT TO REF LAB)  URINE CULTURE  TROPONIN I (HIGH SENSITIVITY)  BRAIN NATRIURETIC PEPTIDE  OCCULT BLOOD X 1 CARD TO LAB, STOOL    EKG EKG Interpretation  Date/Time:  Tuesday July 27 2018 14:47:31 EDT Ventricular Rate:  67 PR Interval:    QRS Duration: 98 QT Interval:  404 QTC Calculation: 427 R Axis:   13 Text Interpretation:  Sinus rhythm Low voltage, precordial leads Confirmed by Julianne Rice [phone]) on 07/27/2018 5:28:40 PM   Radiology Dg Chest Port 1 View  Result Date: 07/27/2018 CLINICAL DATA:  Shortness of breath and weakness. EXAM: PORTABLE CHEST 1 VIEW COMPARISON:  CT chest dated April 19, 2018. Chest x-ray dated September 09, 2016. FINDINGS: The patient is rotated to the left. The heart size and mediastinal contours are within normal limits. Atherosclerotic calcification of the aortic arch. Normal pulmonary vascularity. No focal consolidation, pleural effusion, or pneumothorax. No acute osseous abnormality. IMPRESSION: No active disease. Electronically Signed   By: Titus Dubin M.D.   On: 07/27/2018 16:20    Procedures Procedures (including critical care time)  Medications Ordered in ED  Medications  sodium chloride 0.9 % bolus 1,000 mL ( Intravenous Stopped 07/27/18 1745)     Initial Impression / Assessment and Plan / ED Course  I have reviewed the triage vital signs and the nursing notes.  Pertinent labs & imaging results that were available during my care of the patient were reviewed by me and considered in my medical decision making (see chart for details).    73 year old female with significant PMHx presenting for hypotension with lowest 98/50 at home. Blood pressure in the ED 161/73. Pt is in contact with cardiologist and they can see her tomorrow AM to  discuss blood pressure. Also complaining of generalized weakness and questionable dyspnea on exertion which son endorses but patient denies. Pt is wheezing on exam; otherwise exam unremarkable. No other complaints at this time. No focal neuro deficits on exam. Will obtain CBC, BMP, troponin, BNP, U/A, and CXR today as well as EKG. Pt's last ECHO done in 2017 with normal EF.   CXR without signs of pneumonia. EKG without ischemic changes. Troponin negative. CBC without leukocytosis; patient does have reduced Hgb at 10.3 (last hgb level 1 year ago 12.7). Fecal occult negative. Creatinine mildly elevated at 1.37 with reduced GFR; difficult to distinguish this as AKI vs chronic kidney disease as pts last creatinine 0.96 1 year ago. IVF fluids given in the ED; will have patient repeat creatinine level in 1 week with PCP. BNP negative as well.   Hemoglobin  Date Value Ref Range Status  07/27/2018 10.3 (L) 12.0 - 15.0 g/dL Final  07/03/2017 12.7 12.0 - 15.0 g/dL Final  02/18/2017 13.3 12.0 - 15.0 g/dL Final  08/07/2016 12.3 12.0 - 15.0 g/dL Final    Lab Results  Component Value Date   CREATININE 1.37 (H) 07/27/2018   CREATININE 0.96 07/03/2017   CREATININE 0.87 04/03/2017   U/A with trace leuks and many bacteria; 6-10 WBCs per HPF as well as squamous epitheliam; will send for culture. Pt does report hx of recurrent UTIs, most recently in September 2019. Will treat for UTI today as this may be what is causing pt's generalized weakness. Pt is in agreement with plan at this time and stable for discharge home. Advised to follow up with cardiology as well given complaint of hypotension; pt's blood pressure stable while in the ED.        Final Clinical Impressions(s) / ED Diagnoses   Final diagnoses:  Acute cystitis without hematuria  Elevated serum creatinine  AKI (acute kidney injury) ([place])  Anemia, unspecified type    ED Discharge Orders         Ordered    cephALEXin (KEFLEX) 500 MG capsule   4 times daily     07/27/18 [place], [place]ard, PA-C 07/27/18 2201    Julianne Rice, MD 07/28/18 1520

## 2018-07-27 NOTE — Telephone Encounter (Signed)
Called the patient to review medication changes.  She states she is in the ED since she could not get any answers since Friday.  Apologized to the patient that she was not called back - I had no knowledge she called. She understands her records will be reviewed with Dr. Burt Knack and she will be called tomorrow after discharge to discuss medication changes. She was grateful for assistance.

## 2018-07-27 NOTE — ED Triage Notes (Signed)
Pt reports that her B/P has been dropping too low for about 10 days. Pt reports the lowest it has been is 98/50. Pt reports that when the B/P dropped this AM pt reports that she felt generalized weakness.   Pt noted to have exertional dyspnea when walking to the exam room. Pt states she doesn't notice it but her son has mentioned it to her for 2 weeks.

## 2018-07-27 NOTE — ED Notes (Signed)
ED Provider at bedside. 

## 2018-07-27 NOTE — ED Notes (Signed)
XR at bedside

## 2018-07-27 NOTE — Telephone Encounter (Signed)
Patient is calling again about her low BP.  She states her BP was 150/86 HR 62 this morning at 630am, 116/56, HR 52 at 930am today, last night 141/69 HR 54 at 11:45pm

## 2018-07-27 NOTE — Discharge Instructions (Signed)
Please take antibiotics as prescribed; we have sent your urine for culture and will call you if your antibiotic needs to be changed. Please follow up with your PCP in 1 week to have your kidney function checked again; please increase your fluids until you have this level checked. Please also follow up with cardiology regarding your low blood pressures; your blood pressure was normal while in the ED today.

## 2018-07-27 NOTE — ED Notes (Signed)
Pt using her albuterol inhaler at this time. Because pt states "I'm wheezing."

## 2018-07-27 NOTE — Telephone Encounter (Signed)
Could change to losartan 100 mg or another ARB at a dose equivalent.

## 2018-07-27 NOTE — Telephone Encounter (Signed)
Pt calling stating that her pharmacies [facility] does not have the valsartan 320 mg tablet available, it is on backorder and pt would like to know if Dr. Burt Knack would like to send in an alternative for this medication? Please address

## 2018-07-27 NOTE — Telephone Encounter (Signed)
Patient is calling

## 2018-07-28 LAB — NOVEL CORONAVIRUS, NAA (HOSP ORDER, SEND-OUT TO REF LAB; TAT 18-24 HRS): SARS-CoV-2, NAA: NOT DETECTED

## 2018-07-28 MED ORDER — POTASSIUM CHLORIDE ER 10 MEQ PO TBCR: 10.0000 meq | EXTENDED_RELEASE_TABLET | ORAL | 0 refills | Status: DC | PRN

## 2018-07-28 MED ORDER — FUROSEMIDE 20 MG PO TABS: 20.0000 mg | ORAL_TABLET | ORAL | 3 refills | Status: DC | PRN

## 2018-07-28 MED ORDER — VALSARTAN 320 MG PO TABS: 160.0000 mg | ORAL_TABLET | Freq: Every day | ORAL | 1 refills | Status: DC

## 2018-07-28 MED ORDER — FUROSEMIDE 20 MG PO TABS: 20.0000 mg | ORAL_TABLET | Freq: Every day | ORAL | 3 refills | Status: DC

## 2018-07-28 MED ORDER — POTASSIUM CHLORIDE ER 10 MEQ PO TBCR: 10.0000 meq | EXTENDED_RELEASE_TABLET | ORAL | 0 refills | Status: DC

## 2018-07-28 NOTE — Telephone Encounter (Signed)
Spoke with patient and patient aware of recommendations. Stop taking Lasix and potassium  on a daily basis.She can take Lasix Start and potassium as needed for swelling.Decrease Valsartan to 160 mg once  daily She has  follow up with her PCP in the next couple of weeks.. Patient stated she will get a repeat BMET in 1 week.at her PCP office on 08-10-18  Patient will keep track of BPs and let us know readings in 1 week.

## 2018-07-28 NOTE — Telephone Encounter (Signed)
Patient went to emergency room and was calling to see status.   Patient stated she was put on an antibiotic and recommended to follow up with PCP and a [facility] follow up.  Patient stated she doesn't know why she needs to follow up with [facility] because she has an appointment with PCP.  Patient stated she will do recommendations of Lasix and Potassium Every Mon Wed and Fri. Take extra Lasix and potassium if increased leg swelling. Send some BP readings in 2 weeks.  Patient stated she had labs and would like to take a look and if he see fit shell come in earlier for an appt.

## 2018-07-28 NOTE — Addendum Note (Signed)
Addended by: Claude Manges on: 07/28/2018 04:31 PM   Modules accepted: Orders

## 2018-07-28 NOTE — Telephone Encounter (Signed)
See 6/22 phone note.

## 2018-07-28 NOTE — Telephone Encounter (Signed)
Reviewed notes from ED physician. Her renal function is worse on labs (BUN is 71 and Creatinine 1.37).  It appears she is somewhat dehydrated.  Also, her hemoglobin is low at 10.3.   PLAN:   Stop taking Lasix and potassium  on a daily basis.  She can take Lasix and potassium as needed for swelling.  Decrease Valsartan to 160 mg once daily (notes indicate she may have been switched to Losartan >> Please confirm this).  If so, continue with Losartan as prescribed.  She needs follow up with her PCP in the next 1 week for anemia.  She does need a repeat BMET in 1 week.  Keep track of BPs and let us know readings in 1 week. Richardson Dopp, PA-C    07/28/2018 1:32 PM

## 2018-07-29 ENCOUNTER — Telehealth: Payer: Self-pay | Admitting: Physician Assistant

## 2018-07-29 ENCOUNTER — Telehealth: Payer: Self-pay

## 2018-07-29 NOTE — Telephone Encounter (Signed)
Copied from [place] [id number]. Topic: General - Other >> Jul 29, 2018 12:58 PM Yvette Rack wrote: Reason for CRM: Maxcine Ham a NP with [facility] stated she will be faxing information to Dr. Juleen China. Sadie provided call back # [phone] in case Dr. Juleen China needed to reach her.

## 2018-07-29 NOTE — Telephone Encounter (Signed)
Pt calling about her medication that she spoke to [place], Jerome about and would like a call back concerning this matter. Please address

## 2018-07-30 LAB — URINE CULTURE: Culture: >=100000 — AB

## 2018-07-30 NOTE — Telephone Encounter (Signed)
Spoke with patient about medication dose Valsartan of half of 320mg  is 160mg  and can cut tablet in half for correct dose.  Patient appreciated call back and was on another call and had to go.Patient told to call back clinic any questions or concerns.

## 2018-07-31 ENCOUNTER — Telehealth: Payer: Self-pay | Admitting: Emergency Medicine

## 2018-07-31 NOTE — Telephone Encounter (Signed)
Post ED Visit - Positive Culture Follow-up  Culture report reviewed by antimicrobial stewardship pharmacist: [place] Team []  Elenor Quinones, Pharm.D. []  Heide Guile, Pharm.D., BCPS AQ-ID []  Parks Neptune, Pharm.D., BCPS []  Alycia Rossetti, Pharm.D., BCPS []  [place], Pharm.D., BCPS, AAHIVP []  Legrand Como, Pharm.D., BCPS, AAHIVP [x]  Salome Arnt, PharmD, BCPS []  Johnnette Gourd, PharmD, BCPS []  Hughes Better, PharmD, BCPS []  Leeroy Cha, PharmD []  Laqueta Linden, PharmD, BCPS []  Albertina Parr, PharmD  [place] Team []  Leodis Sias, PharmD []  Lindell Spar, PharmD []  Royetta Asal, PharmD []  Graylin Shiver, Rph []  Rema Fendt) Glennon Mac, PharmD []  Arlyn Dunning, PharmD []  Netta Cedars, PharmD []  Dia Sitter, PharmD []  Leone Haven, PharmD []  Gretta Arab, PharmD []  Theodis Shove, PharmD []  Peggyann Juba, PharmD []  Reuel Boom, PharmD   Positive urine culture Treated with Cephalexin, organism sensitive to the same and no further patient follow-up is required at this time.  [place] Cathlin Buchan 07/31/2018, 10:11 AM

## 2018-08-04 ENCOUNTER — Telehealth: Payer: Self-pay | Admitting: Family Medicine

## 2018-08-04 NOTE — Telephone Encounter (Signed)
Pt stated she is unsure if the antibiotic she is on for UTI is the right one? Pt wants this looked at before she comes in 7/7 for another culture. Please advise.

## 2018-08-04 NOTE — Telephone Encounter (Signed)
Patient was seen at ED wanted to make sure that she was on the right abx. They did not have culture back when she was given medications. She has culture from that visit in chart on 6/23

## 2018-08-05 NOTE — Telephone Encounter (Signed)
Patient informed. 

## 2018-08-05 NOTE — Telephone Encounter (Signed)
Yes, Keflex will cover.

## 2018-08-10 ENCOUNTER — Encounter: Payer: Self-pay | Admitting: Family Medicine

## 2018-08-10 ENCOUNTER — Other Ambulatory Visit: Payer: Self-pay

## 2018-08-10 ENCOUNTER — Ambulatory Visit (INDEPENDENT_AMBULATORY_CARE_PROVIDER_SITE_OTHER): Payer: PPO | Admitting: Family Medicine

## 2018-08-10 VITALS — BP 114/64 | HR 67 | Temp 98.4°F | Ht 61.3 in | Wt 235.4 lb

## 2018-08-10 DIAGNOSIS — E118 Type 2 diabetes mellitus with unspecified complications: Secondary | ICD-10-CM

## 2018-08-10 DIAGNOSIS — E785 Hyperlipidemia, unspecified: Secondary | ICD-10-CM

## 2018-08-10 DIAGNOSIS — E1159 Type 2 diabetes mellitus with other circulatory complications: Secondary | ICD-10-CM | POA: Diagnosis not present

## 2018-08-10 DIAGNOSIS — E1169 Type 2 diabetes mellitus with other specified complication: Secondary | ICD-10-CM | POA: Diagnosis not present

## 2018-08-10 DIAGNOSIS — I10 Essential (primary) hypertension: Secondary | ICD-10-CM | POA: Diagnosis not present

## 2018-08-10 DIAGNOSIS — D518 Other vitamin B12 deficiency anemias: Secondary | ICD-10-CM | POA: Diagnosis not present

## 2018-08-10 DIAGNOSIS — Z9884 Bariatric surgery status: Secondary | ICD-10-CM

## 2018-08-10 DIAGNOSIS — M816 Localized osteoporosis [Lequesne]: Secondary | ICD-10-CM | POA: Diagnosis not present

## 2018-08-10 DIAGNOSIS — I152 Hypertension secondary to endocrine disorders: Secondary | ICD-10-CM

## 2018-08-10 DIAGNOSIS — N390 Urinary tract infection, site not specified: Secondary | ICD-10-CM | POA: Diagnosis not present

## 2018-08-10 LAB — MAGNESIUM: Magnesium: 2.1 mg/dL (ref 1.5–2.5)

## 2018-08-10 LAB — COMPREHENSIVE METABOLIC PANEL
ALT: 11 U/L (ref 0–35)
AST: 13 U/L (ref 0–37)
Albumin: 4 g/dL (ref 3.5–5.2)
Alkaline Phosphatase: 170 U/L — ABNORMAL HIGH (ref 39–117)
BUN: 19 mg/dL (ref 6–23)
CO2: 27 mEq/L (ref 19–32)
Calcium: 9.4 mg/dL (ref 8.4–10.5)
Chloride: 105 mEq/L (ref 96–112)
Creatinine, Ser: 0.92 mg/dL (ref 0.40–1.20)
GFR: 60.19 mL/min (ref >60.00)
Glucose, Bld: 123 mg/dL — ABNORMAL HIGH (ref 70–99)
Potassium: 4.5 mEq/L (ref 3.5–5.1)
Sodium: 140 mEq/L (ref 135–145)
Total Bilirubin: 0.5 mg/dL (ref 0.2–1.2)
Total Protein: 6.5 g/dL (ref 6.0–8.3)

## 2018-08-10 LAB — URINALYSIS, ROUTINE W REFLEX MICROSCOPIC
Bilirubin Urine: NEGATIVE
Hgb urine dipstick: NEGATIVE
Ketones, ur: NEGATIVE
Leukocytes,Ua: NEGATIVE
Nitrite: NEGATIVE
Specific Gravity, Urine: 1.015 (ref 1.000–1.030)
Total Protein, Urine: NEGATIVE
Urine Glucose: NEGATIVE
Urobilinogen, UA: 0.2 (ref 0.0–1.0)
pH: 5.5 (ref 5.0–8.0)

## 2018-08-10 LAB — CBC WITH DIFFERENTIAL/PLATELET
Basophils Absolute: 0 10*3/uL (ref 0.0–0.1)
Basophils Relative: 0.3 % (ref 0.0–3.0)
Eosinophils Absolute: 0.2 10*3/uL (ref 0.0–0.7)
Eosinophils Relative: 3.3 % (ref 0.0–5.0)
HCT: 34.7 % — ABNORMAL LOW (ref 36.0–46.0)
Hemoglobin: 11 g/dL — ABNORMAL LOW (ref 12.0–15.0)
Lymphocytes Relative: 21.6 % (ref 12.0–46.0)
Lymphs Abs: 1.5 10*3/uL (ref 0.7–4.0)
MCHC: 31.8 g/dL (ref 30.0–36.0)
MCV: 79.2 fl (ref 78.0–100.0)
Monocytes Absolute: 0.6 10*3/uL (ref 0.1–1.0)
Monocytes Relative: 7.9 % (ref 3.0–12.0)
Neutro Abs: 4.7 10*3/uL (ref 1.4–7.7)
Neutrophils Relative %: 66.9 % (ref 43.0–77.0)
Platelets: 257 10*3/uL (ref 150.0–400.0)
RBC: 4.38 Mil/uL (ref 3.87–5.11)
RDW: 15.8 % — ABNORMAL HIGH (ref 11.5–15.5)
WBC: 7 10*3/uL (ref 4.0–10.5)

## 2018-08-10 LAB — HEMOGLOBIN A1C: Hgb A1c MFr Bld: 6.6 % — ABNORMAL HIGH (ref 4.6–6.5)

## 2018-08-10 LAB — LIPID PANEL
Cholesterol: 143 mg/dL (ref 0–200)
HDL: 41.7 mg/dL (ref >39.00)
LDL Cholesterol: 71 mg/dL (ref 0–99)
NonHDL: 101.76
Total CHOL/HDL Ratio: 3
Triglycerides: 155 mg/dL — ABNORMAL HIGH (ref 0.0–149.0)
VLDL: 31 mg/dL (ref 0.0–40.0)

## 2018-08-10 LAB — VITAMIN B12: Vitamin B-12: 406 pg/mL (ref 211–911)

## 2018-08-10 NOTE — Progress Notes (Signed)
Tricia [name] is a 73 y.o. female is here to follow up on UTI and to have blood work done.  History of Present Illness:   HPI:   1. Anemia, B12 deficiency Lab Results  Component Value Date   VITAMINB12 >1500 (H) 08/03/2017   Lab Results  Component Value Date   WBC 6.2 07/27/2018   HGB 10.3 (L) 07/27/2018   HCT 33.8 (L) 07/27/2018   MCV 83.7 07/27/2018   PLT 215 07/27/2018   2. Controlled type 2 diabetes mellitus with complication, without long-term current use of insulin (HCC) Lab Results  Component Value Date   HGBA1C 6.3 07/03/2017   HGBA1C 6.0 02/18/2017   HGBA1C 5.1 06/25/2016   Lab Results  Component Value Date   MICROALBUR 4.2 (H) 08/19/2013   LDLCALC 91 07/03/2017   CREATININE 1.37 (H) 07/27/2018   3. History of gastric bypass Taking Bariatric MVM  4. Hyperlipidemia associated with type 2 diabetes mellitus [facility]) Lab Results  Component Value Date   CHOL 162 07/03/2017   HDL 48.60 07/03/2017   LDLCALC 91 07/03/2017   LDLDIRECT 86.0 09/11/2015   TRIG 115.0 07/03/2017   CHOLHDL 3 07/03/2017   Lab Results  Component Value Date   ALT 10 07/03/2017   AST 15 07/03/2017   ALKPHOS 99 07/03/2017   BILITOT 0.5 07/03/2017   5. Hypertension associated with diabetes ([place]) BP Readings from Last 3 Encounters:  08/10/18 114/64  07/27/18 (!) 136/57  05/18/18 (!) 188/84   6. Localized osteoporosis without current pathological fracture [employer] financial program no longer offered per patient. Last injection was a year ago. She feels that she is losing height.   7. UTI Recent Klebsiella UTI, s/p treatment with Keflex. Symptoms were decreased urination, hypotension. ED note reviewed.   Health Maintenance Due  Topic Date Due  . HEMOGLOBIN A1C  01/02/2018  . FOOT EXAM  07/04/2018   Depression screen [facility] 2/9 07/03/2017 03/10/2016 12/03/2015  Decreased Interest 0 0 0  Down, Depressed, Hopeless 0 0 0  PHQ - 2 Score 0 0 0  Altered sleeping - - -  Tired, decreased  energy - - -  Change in appetite - - -  Feeling bad or failure about yourself  - - -  Trouble concentrating - - -  Moving slowly or fidgety/restless - - -  Suicidal thoughts - - -  PHQ-9 Score - - -  Difficult doing work/chores - - -  Some recent data might be hidden   PMHx, SurgHx, SocialHx, FamHx, Medications, and Allergies were reviewed in the Visit Navigator and updated as appropriate.   Patient Active Problem List   Diagnosis Date Noted  . PVC's (premature ventricular contractions) 04/16/2017  . Hyperparathyroidism, secondary ([place]) 07/06/2016  . Right renal atrophy 04/19/2016  . COPD (chronic obstructive pulmonary disease) ([place]) 04/19/2016  . Aortic atherosclerosis ([place]) 04/19/2016  . Atherosclerosis of arteries 04/19/2016  . Sleep apnea, not on CPAP   . Diverticulosis   . CAD (coronary artery disease) 03/10/2016  . Rib pain - s/p CPR 10/08/2015  . History of cardiac arrest 10/01/2015  . History of ST elevation myocardial infarction (STEMI) 10/01/2015  . Second hand smoke exposure 07/28/2015  . Postoperative malabsorption 07/27/2015  . Recurrent UTI 03/14/2014  . GERD (gastroesophageal reflux disease) 02/14/2014  . History of gastric bypass 02/14/2014  . Former smoker 02/14/2014  . Diabetes mellitus type II, controlled ([place]) 02/14/2014  . Anemia, B12 deficiency 04/04/2009  . Osteoporosis 12/14/2006  . Hyperlipidemia associated  with type 2 diabetes mellitus ([place]) 09/22/2006  . Hypertension associated with diabetes ([place]) 09/22/2006  . Osteoarthritis 09/22/2006  . 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 Adenomatous polyp of colon 02/04/2004   Social History   Tobacco Use  . Smoking status: Former Smoker    Packs/day: 2.00    Years: 41.00    Pack years: 82.00    Types: Cigarettes    Quit date: 03/06/2012    Years since quitting: 6.4  . Smokeless tobacco: Never Used  Substance Use Topics  . Alcohol use: No    Alcohol/week: 0.0 standard drinks   . Drug use: No   Current Medications and Allergies   Current Outpatient Medications:  .  albuterol (PROVENTIL HFA;VENTOLIN HFA) 108 (90 Base) MCG/ACT inhaler, Inhale 2 puffs into the lungs every 6 (six) hours as needed for wheezing or shortness of breath., Disp: 3 Inhaler, Rfl: 0 .  amLODipine (NORVASC) 5 MG tablet, Take 1 tablet (5 mg total) by mouth daily., Disp: 90 tablet, Rfl: 2 .  aspirin EC 81 MG tablet, Take 81 mg by mouth daily., Disp: , Rfl:  .  atorvastatin (LIPITOR) 80 MG tablet, Take 1 tablet (80 mg total) by mouth daily., Disp: 90 tablet, Rfl: 2 .  baclofen (LIORESAL) 10 MG tablet, Take 1 tablet (10 mg total) by mouth 2 (two) times daily., Disp: 60 tablet, Rfl: 0 .  blood glucose meter kit and supplies KIT, Dispense based on patient and insurance preference. Use to test daily. (FOR ICD-9 250.00, 250.01). 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  carvedilol (COREG) 3.125 MG tablet, Pt to take 3.125 mg with the carvedilol 6.25 mg for a total of 9.375 Twice a day., Disp: 180 tablet, Rfl: 2 .  carvedilol (COREG) 6.25 MG tablet, Pt to take carvedilol 6.25 mg with the carvedilol 3.125 mg for a total of 9.375 mg twice da day., Disp: 180 tablet, Rfl: 2 .  clopidogrel (PLAVIX) 75 MG tablet, Take 1 tablet (75 mg total) by mouth daily., Disp: 90 tablet, Rfl: 1 .  gabapentin (NEURONTIN) 100 MG capsule, TAKE 1 CAPSULE BY MOUTH THREE TIMES A DAY, Disp: 270 capsule, Rfl: 0 .  glucose blood (FREESTYLE LITE) test strip, TEST UP TO FOUR TIMES DAILY Dx: E11.22, Disp: 360 each, Rfl: 5 .  HYDROcodone-acetaminophen (NORCO) 10-325 MG tablet, Take 1 tablet by mouth every 8 (eight) hours as needed., Disp: 30 tablet, Rfl: 0 .  Lancets (FREESTYLE) lancets, TEST UP TO FOUR TIMES DAILY Dx: E11.22, Disp: 360 each, Rfl: 5 .  nitroGLYCERIN (NITROLINGUAL) 0.4 MG/SPRAY spray, Place 1 spray under the tongue every 5 (five) minutes x 3 doses as needed for chest pain., Disp: 4.9 g, Rfl: 3 .  budesonide-formoterol  (SYMBICORT) 160-4.5 MCG/ACT inhaler, Inhale 2 puffs into the lungs 2 (two) times daily. (Patient not taking: Reported on 08/10/2018), Disp: 1 Inhaler, Rfl: 3 .  denosumab (PROLIA) 60 MG/ML SOSY injection, Inject 60 mg into the skin every 6 (six) months., Disp: , Rfl:  .  furosemide (LASIX) 20 MG tablet, Take 1 tablet (20 mg total) by mouth as needed (Leg swelling)., Disp: 30 tablet, Rfl: 3 .  pantoprazole (PROTONIX) 40 MG tablet, Take 1 tablet (40 mg total) by mouth 2 (two) times daily before a meal., Disp: 60 tablet, Rfl: 11 .  potassium chloride (K-DUR) 10 MEQ tablet, Take 1 tablet (10 mEq total) by mouth as needed (Leg swelling). (Patient not taking: Reported on 08/10/2018), Disp: 60 tablet,  Rfl: 0 .  valsartan (DIOVAN) 320 MG tablet, Take 0.5 tablets (160 mg total) by mouth daily., Disp: 90 tablet, Rfl: 1   Allergies  Allergen Reactions  . Sulfa Antibiotics Diarrhea and Nausea And Vomiting   Review of Systems   Pertinent items are noted in the HPI. Otherwise, a complete ROS is negative.  Vitals   Vitals:   08/10/18 0941  BP: 114/64  Pulse: 67  Temp: 98.4 F (36.9 C)  TempSrc: Oral  SpO2: 96%  Weight: 235 lb 6.4 oz (106.8 kg)  Height: 5' 1.3" (1.557 m)     Body mass index is 44.05 kg/m.  Physical Exam   Physical Exam Vitals signs and nursing note reviewed.  HENT:     Head: Normocephalic and atraumatic.  Eyes:     Pupils: Pupils are equal, round, and reactive to light.  Neck:     Musculoskeletal: Normal range of motion and neck supple.  Cardiovascular:     Rate and Rhythm: Normal rate and regular rhythm.     Heart sounds: Normal heart sounds.  Pulmonary:     Effort: Pulmonary effort is normal.  Abdominal:     Palpations: Abdomen is soft.  Skin:    General: Skin is warm.  Psychiatric:        Behavior: Behavior normal.    Assessment and Plan   Marcie was seen today for follow-up.  Diagnoses and all orders for this visit:  Anemia, B12 deficiency -     CBC with  Differential/Platelet -     Iron, TIBC and Ferritin Panel -     Vitamin B12  Controlled type 2 diabetes mellitus with complication, without long-term current use of insulin (HCC) -     Comprehensive metabolic panel -     Hemoglobin A1c  History of gastric bypass  Hyperlipidemia associated with type 2 diabetes mellitus (HCC) -     Lipid panel  Hypertension associated with diabetes (HCC) -     Magnesium  Localized osteoporosis without current pathological fracture  Recurrent UTI -     Urinalysis, Routine w reflex microscopic -     Urinalysis, Routine w reflex microscopic; Future -     Urine Culture; Future   . Orders and follow up as documented in [place], reviewed diet, exercise and weight control, cardiovascular risk and specific lipid/LDL goals reviewed, reviewed medications and side effects in detail.  . Reviewed expectations re: course of current medical issues. . Outlined signs and symptoms indicating need for more acute intervention. . Patient verbalized understanding and all questions were answered. . Patient received an After Visit Summary.  Records requested if needed. Time spent: 25 minutes, of which >50% was spent in obtaining information about her symptoms, reviewing her previous labs, evaluations, and treatments, counseling her about her condition (please see the discussed topics above), and developing a plan to further investigate it; she had a number of questions which I addressed.   Briscoe Deutscher, DO [place], [facility] 08/10/2018

## 2018-08-11 LAB — IRON,TIBC AND FERRITIN PANEL
%SAT: 13 % (calc) — ABNORMAL LOW (ref 16–45)
Ferritin: 11 ng/mL — ABNORMAL LOW (ref 16–288)
Iron: 44 ug/dL — ABNORMAL LOW (ref 45–160)
TIBC: 343 mcg/dL (calc) (ref 250–450)

## 2018-08-21 ENCOUNTER — Other Ambulatory Visit: Payer: Self-pay | Admitting: Family Medicine

## 2018-08-21 DIAGNOSIS — R600 Localized edema: Secondary | ICD-10-CM

## 2018-08-21 DIAGNOSIS — G8929 Other chronic pain: Secondary | ICD-10-CM

## 2018-08-23 NOTE — Telephone Encounter (Signed)
Last OV 08/10/18  Potassium previously filled by Richardson Dopp, PA  Forwarding to Dr. Juleen China.

## 2018-08-24 ENCOUNTER — Encounter: Payer: PPO | Admitting: Family Medicine

## 2018-08-26 ENCOUNTER — Encounter: Payer: Self-pay | Admitting: Family Medicine

## 2018-08-26 ENCOUNTER — Ambulatory Visit (INDEPENDENT_AMBULATORY_CARE_PROVIDER_SITE_OTHER): Payer: PPO | Admitting: Family Medicine

## 2018-08-26 VITALS — BP 110/70 | HR 69 | Temp 97.9°F | Ht 61.3 in | Wt 228.0 lb

## 2018-08-26 DIAGNOSIS — M159 Polyosteoarthritis, unspecified: Secondary | ICD-10-CM

## 2018-08-26 DIAGNOSIS — E559 Vitamin D deficiency, unspecified: Secondary | ICD-10-CM | POA: Diagnosis not present

## 2018-08-26 DIAGNOSIS — Z9884 Bariatric surgery status: Secondary | ICD-10-CM

## 2018-08-26 DIAGNOSIS — I2510 Atherosclerotic heart disease of native coronary artery without angina pectoris: Secondary | ICD-10-CM | POA: Diagnosis not present

## 2018-08-26 DIAGNOSIS — Z9049 Acquired absence of other specified parts of digestive tract: Secondary | ICD-10-CM | POA: Diagnosis not present

## 2018-08-26 DIAGNOSIS — R748 Abnormal levels of other serum enzymes: Secondary | ICD-10-CM | POA: Diagnosis not present

## 2018-08-26 DIAGNOSIS — M8949 Other hypertrophic osteoarthropathy, multiple sites: Secondary | ICD-10-CM

## 2018-08-26 DIAGNOSIS — Z0000 Encounter for general adult medical examination without abnormal findings: Secondary | ICD-10-CM | POA: Diagnosis not present

## 2018-08-26 DIAGNOSIS — E538 Deficiency of other specified B group vitamins: Secondary | ICD-10-CM

## 2018-08-26 DIAGNOSIS — M150 Primary generalized (osteo)arthritis: Secondary | ICD-10-CM | POA: Diagnosis not present

## 2018-08-26 LAB — COMPREHENSIVE METABOLIC PANEL
ALT: 8 U/L (ref 0–35)
AST: 12 U/L (ref 0–37)
Albumin: 4 g/dL (ref 3.5–5.2)
Alkaline Phosphatase: 165 U/L — ABNORMAL HIGH (ref 39–117)
BUN: 39 mg/dL — ABNORMAL HIGH (ref 6–23)
CO2: 25 mEq/L (ref 19–32)
Calcium: 10.1 mg/dL (ref 8.4–10.5)
Chloride: 106 mEq/L (ref 96–112)
Creatinine, Ser: 1.03 mg/dL (ref 0.40–1.20)
GFR: 52.83 mL/min — ABNORMAL LOW (ref >60.00)
Glucose, Bld: 119 mg/dL — ABNORMAL HIGH (ref 70–99)
Potassium: 4.9 mEq/L (ref 3.5–5.1)
Sodium: 139 mEq/L (ref 135–145)
Total Bilirubin: 0.4 mg/dL (ref 0.2–1.2)
Total Protein: 6.4 g/dL (ref 6.0–8.3)

## 2018-08-26 LAB — VITAMIN D 25 HYDROXY (VIT D DEFICIENCY, FRACTURES): VITD: 29.09 ng/mL — ABNORMAL LOW (ref 30.00–100.00)

## 2018-08-26 LAB — GAMMA GT: GGT: 11 U/L (ref 7–51)

## 2018-08-26 MED ORDER — HYDROCODONE-ACETAMINOPHEN 10-325 MG PO TABS: 1.0000 | ORAL_TABLET | Freq: Three times a day (TID) | ORAL | 0 refills | Status: DC | PRN

## 2018-08-26 MED ORDER — NITROGLYCERIN 0.4 MG/SPRAY TL SOLN: 1.0000 | 3 refills | Status: DC | PRN

## 2018-08-26 MED ORDER — FERROUS SULFATE 325 (65 FE) MG PO TABS: 325.0000 mg | ORAL_TABLET | ORAL | 3 refills | Status: DC

## 2018-08-26 MED ORDER — B-12 1000 MCG SL SUBL: 1.0000 | SUBLINGUAL_TABLET | Freq: Every day | SUBLINGUAL | 3 refills | Status: DC

## 2018-08-26 MED ORDER — BARIATRIC MULTIVITAMINS/IRON PO CAPS: 1.0000 | ORAL_CAPSULE | Freq: Every day | ORAL | 3 refills | Status: DC

## 2018-08-26 NOTE — Progress Notes (Signed)
Subjective:   The patient is here for annual Medicare wellness examination and management of other chronic and acute problems.  1.  Osteoporosis.  The patient is having a difficult time affording Prol[employer].  She was previously getting supplemental coverage via the company.  We are already working on trying to get more coverage.  Patient is unable to take any pills due to her history of bariatric surgery.  2.  Pill packs are working well.  Makes organizing medication much easier.  . The risk factors are reflected in the social history. . The roster of all physicians providing medical care to patient - is listed in the Snapshot section of the chart. . Activities of daily living:  The patient is 100% independent in all ADLs: dressing, toileting, feeding as well as independent mobility. . Home safety  The patient has smoke detectors in the home. They wear seatbelts.  There are no firearms at home. There is no violence in the home.  . There is no risks for hepatitis, STDs, or HIV. There is no history of blood transfusion. They have no travel history to infectious disease endemic areas of the world. . The patient has seen a dentist in the last six months. They have seen their eye doctor in the last year. They admit to slight hearing difficulty with regard to whispered voices and some television programs.  They have deferred audiologic testing in the last year.  They do not  have excessive sun exposure. Discussed the need for sun protection: hats, long sleeves and use of sunscreen if there is significant sun exposure.  . The importance of a healthy diet is discussed. They do have a healthy diet. . The benefits of regular aerobic exercise were discussed.  . There are no signs or vegative symptoms of depression, including irritability, change in appetite, anhedonia, sadness/tearfullness. . Cognitive assessment completed and without concerns. . The following portions of the patient's history were reviewed  and updated as appropriate: allergies, current medications, past family history, past medical history, past surgical history, past social history  and problem list.  Patient Care Team: Briscoe Deutscher, DO as PCP - General (Family Medicine) Sherren Mocha, MD as PCP - Cardiology (Cardiology) Ladene Artist, MD as Consulting Physician (Gastroenterology) Sharmon Revere as Physician Assistant (Cardiology) Sharmon Revere as Physician Assistant (Cardiology) Gardiner Barefoot, DPM as Consulting Physician (Podiatry)  Past medical, surgical, social and family history reviewed and updated:  Patient Active Problem List   Diagnosis Date Noted  . History of cholecystectomy 08/26/2018  . PVC's (premature ventricular contractions) 04/16/2017  . Hyperparathyroidism, secondary ([place]) 07/06/2016  . Right renal atrophy 04/19/2016  . COPD (chronic obstructive pulmonary disease) ([place]) 04/19/2016  . Aortic atherosclerosis ([place]) 04/19/2016  . Atherosclerosis of arteries 04/19/2016  . Sleep apnea, not on CPAP   . Diverticulosis   . CAD (coronary artery disease) 03/10/2016  . Rib pain - s/p CPR 10/08/2015  . History of cardiac arrest 10/01/2015  . History of ST elevation myocardial infarction (STEMI) 10/01/2015  . Second hand smoke exposure 07/28/2015  . Postoperative malabsorption 07/27/2015  . Recurrent UTI 03/14/2014  . GERD (gastroesophageal reflux disease) 02/14/2014  . History of gastric bypass 02/14/2014  . Former smoker 02/14/2014  . Diabetes mellitus type II, controlled ([place]) 02/14/2014  . Anemia, B12 deficiency 04/04/2009  . Osteoporosis 12/14/2006  . Hyperlipidemia associated with type 2 diabetes mellitus ([place]) 09/22/2006  . Hypertension associated with diabetes ([place]) 09/22/2006  . Osteoarthritis 09/22/2006  .  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 Adenomatous polyp of colon 02/04/2004   Past Surgical History:  Procedure Laterality Date  . ABDOMINAL  HYSTERECTOMY    . APPENDECTOMY    . BARIATRIC SURGERY    . CARDIAC CATHETERIZATION N/A 10/01/2015   Procedure: Left Heart Cath and Coronary Angiography;  Surgeon: Leonie Man, MD;  Location: [place] [facility];  Service: Cardiovascular;  Laterality: N/A;  . CARDIAC CATHETERIZATION N/A 10/01/2015   Procedure: Coronary Stent Intervention;  Surgeon: Leonie Man, MD;  Location: [place] [facility];  Service: Cardiovascular;  Laterality: N/A;  . CARDIAC CATHETERIZATION N/A 10/03/2015   Procedure: Coronary Stent Intervention;  Surgeon: Troy Sine, MD;  Location: [place] [place] [facility];  Service: Cardiovascular;  Laterality: N/A;  . CARDIAC CATHETERIZATION N/A 10/03/2015   Procedure: Coronary/Graft Angiography;  Surgeon: Troy Sine, MD;  Location: [facility] [place] [facility];  Service: Cardiovascular;  Laterality: N/A;  . CARPAL TUNNEL RELEASE Bilateral   . CERVICAL DISC SURGERY    . CHOLECYSTECTOMY    . CORONARY ANGIOPLASTY  2002  . HERNIA REPAIR    . LUMBAR LAMINECTOMY     L3-4  . NEPHRECTOMY Right    Partial  . TONSILLECTOMY    . TOTAL KNEE ARTHROPLASTY Left   . TUBAL LIGATION    . ULNAR NERVE REPAIR Left    Social History   Tobacco Use  . Smoking status: Former Smoker    Packs/day: 2.00    Years: 41.00    Pack years: 82.00    Types: Cigarettes    Quit date: 03/06/2012    Years since quitting: 6.4  . Smokeless tobacco: Never Used  Substance Use Topics  . Alcohol use: No    Alcohol/week: 0.0 standard drinks   Family History  Problem Relation Age of Onset  . Colitis Mother        sepsis from c dif colitis  . Heart disease Father   . Lung cancer Maternal Uncle   . Stomach cancer Maternal Aunt   . Lung cancer Maternal Uncle   . Colon cancer Neg Hx     Current medication list and allergy/intolerance information reviewed and updated:    Current Outpatient Medications:  .  albuterol (PROVENTIL HFA;VENTOLIN HFA) 108 (90 Base) MCG/ACT inhaler, Inhale 2 puffs into the lungs every  6 (six) hours as needed for wheezing or shortness of breath., Disp: 3 Inhaler, Rfl: 0 .  amLODipine (NORVASC) 5 MG tablet, Take 1 tablet (5 mg total) by mouth daily., Disp: 90 tablet, Rfl: 2 .  aspirin EC 81 MG tablet, Take 81 mg by mouth daily., Disp: , Rfl:  .  atorvastatin (LIPITOR) 80 MG tablet, Take 1 tablet (80 mg total) by mouth daily., Disp: 90 tablet, Rfl: 2 .  baclofen (LIORESAL) 10 MG tablet, Take 1 tablet by mouth twice daily., Disp: 60 tablet, Rfl: 0 .  carvedilol (COREG) 3.125 MG tablet, Pt to take 3.125 mg with the carvedilol 6.25 mg for a total of 9.375 Twice a day., Disp: 180 tablet, Rfl: 2 .  carvedilol (COREG) 6.25 MG tablet, Pt to take carvedilol 6.25 mg with the carvedilol 3.125 mg for a total of 9.375 mg twice da day., Disp: 180 tablet, Rfl: 2 .  clopidogrel (PLAVIX) 75 MG tablet, Take 1 tablet (75 mg total) by mouth daily., Disp: 90 tablet, Rfl: 1 .  denosumab (PROLIA) 60 MG/ML SOSY injection, Inject 60 mg into the skin every 6 (six) months.,  Disp: , Rfl:  .  furosemide (LASIX) 20 MG tablet, Take 1 tablet (20 mg total) by mouth as needed (Leg swelling)., Disp: 30 tablet, Rfl: 3 .  gabapentin (NEURONTIN) 100 MG capsule, TAKE 1 CAPSULE BY MOUTH THREE TIMES A DAY, Disp: 270 capsule, Rfl: 0 .  glucose blood (FREESTYLE LITE) test strip, TEST UP TO FOUR TIMES DAILY Dx: E11.22, Disp: 360 each, Rfl: 5 .  HYDROcodone-acetaminophen (NORCO) 10-325 MG tablet, Take 1 tablet by mouth every 8 (eight) hours as needed., Disp: 30 tablet, Rfl: 0 .  Lancets (FREESTYLE) lancets, TEST UP TO FOUR TIMES DAILY Dx: E11.22, Disp: 360 each, Rfl: 5 .  nitroGLYCERIN (NITROLINGUAL) 0.4 MG/SPRAY spray, Place 1 spray under the tongue every 5 (five) minutes x 3 doses as needed for chest pain., Disp: 4.9 g, Rfl: 3 .  pantoprazole (PROTONIX) 40 MG tablet, Take 1 tablet (40 mg total) by mouth 2 (two) times daily before a meal., Disp: 60 tablet, Rfl: 11 .  potassium chloride (K-DUR) 10 MEQ tablet, Take 1 tablet by  mouth twice daily., Disp: 60 tablet, Rfl: 0 .  valsartan (DIOVAN) 320 MG tablet, Take 0.5 tablets (160 mg total) by mouth daily., Disp: 90 tablet, Rfl: 1 .  blood glucose meter kit and supplies KIT, Dispense based on patient and insurance preference. Use to test daily. (FOR ICD-9 250.00, 250.01). 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  Cyanocobalamin (B-12) 1000 MCG SUBL, Place 1 tablet under the tongue daily., Disp: 30 tablet, Rfl: 3 .  Multiple Vitamins-Minerals (BARIATRIC MULTIVITAMINS/IRON) CAPS, Take 1 tablet by mouth daily., Disp: 90 capsule, Rfl: 3  Allergies  Allergen Reactions  . Sulfa Antibiotics Diarrhea and Nausea And Vomiting   Review of Systems  Constitutional: Negative for chills, fever, malaise/fatigue and weight loss.  Respiratory: Negative for cough, shortness of breath and wheezing.   Cardiovascular: Negative for chest pain, palpitations and leg swelling.  Gastrointestinal: Negative for abdominal pain, constipation, diarrhea, nausea and vomiting.  Genitourinary: Negative for dysuria and urgency.  Musculoskeletal: Positive for joint pain and myalgias. Negative for falls.  Skin: Negative for rash.  Neurological: Negative for dizziness and headaches.  Psychiatric/Behavioral: Negative for depression, substance abuse and suicidal ideas. The patient is not nervous/anxious.    Health Risk Assessment:   Note: All questionnaires completed on date of visit, but may have been entered into Epic on subsequent day.  CLINICAL INTAKE, INCLUDING ADLS, SENSORY DIFFICULTY Pre-visit preparation completed: Yes  Nutritional Status: BMI 25 -29 Overweight Nutritional Risks: Other (Comment)(Hx of bariatric surgery)  Activities of Daily Living: Independent Ambulation: Independent Medication Administration: Independent Home Management: Independent  Barriers to Care Management & Learning: None Psychosocial Barriers: Financial need  Do you feel unsafe in your current relationship?:  No Do you feel physically threatened by others?: No Anyone hurting you at home, work, or school?: No  How often do you need to have someone help you when you read instructions, pamphlets, or other written materials from your doctor or pharmacy?: 1 - Never  Interpreter Needed?: No  GOALS Goals    . Weight < 180 lb (81.647 kg)     Is currently trying to lose weight What is the "Mediterranean" diet?  There's no one "Mediterranean" diet. At least 16 countries border the [employer]. Diets vary between these countries and also between regions within a country. Many differences in culture, ethnic background, religion, economy and agricultural production result in different diets. But the common Mediterranean dietary pattern has these characteristics:  high consumption of fruits, vegetables, bread and other cereals, potatoes, beans, nuts and seeds  olive oil is an important monounsaturated fat source  dairy products, fish and poultry are consumed in low to moderate amounts, and little red meat is eaten  eggs are consumed zero to four times a week  wine is consumed in low to moderate amounts Does a Mediterranean-style diet follow American Heart Association dietary recommendations? Mediterranean-style diets are often close to our dietary recommendations, but they don't follow them exactly. In general, the diets of Mediterranean peoples contain a relatively high percentage of calories from fat. This is thought to contribute to the increasing obesity in these countries, which is becoming a concern. People who follow the average Mediterranean diet eat less saturated fat than those who eat the average American diet. In fact, saturated fat consumption is well within our dietary guidelines. More than half the fat calories in a Mediterranean diet come from monounsaturated fats (mainly from olive oil). Monounsaturated fat doesn't raise blood cholesterol levels the way saturated fat does. The incidence of  heart disease in [place] countries is lower than in the [place]. Death rates are lower, too. But this may not be entirely due to the diet. Lifestyle factors (such as more physical activity and extended social support systems) may also play a part. Before advising people to follow a Mediterranean diet, we need more studies to find out whether the diet itself or other lifestyle factors account for the lower deaths from heart disease.  American Heart Association       FUNCTIONAL STATUS SURVEY, EXERCISE, CARDIAC RISK FACTORS Is the patient deaf or have difficulty hearing?: No Does the patient have difficulty seeing, even when wearing glasses/contacts?: No Does the patient have difficulty concentrating, remembering, or making decisions?: No Does the patient have difficulty walking or climbing stairs?: Yes Does the patient have difficulty dressing or bathing?: No Does the patient have difficulty doing errands alone such as visiting a doctor's office or shopping?: No  DEPRESSION QUESTIONNAIRE Depression screen [facility] 2/9 08/26/2018 07/03/2017 03/10/2016  Decreased Interest 0 0 0  Down, Depressed, Hopeless 0 0 0  PHQ - 2 Score 0 0 0  Altered sleeping 0 - -  Tired, decreased energy 3 - -  Change in appetite 0 - -  Feeling bad or failure about yourself  0 - -  Trouble concentrating 0 - -  Moving slowly or fidgety/restless 0 - -  Suicidal thoughts 0 - -  PHQ-9 Score 3 - -  Difficult doing work/chores Somewhat difficult - -  Some recent data might be hidden   FALL RISK Fall Risk  08/26/2018 07/03/2017 04/10/2017 04/10/2017 04/10/2017  Falls in the past year? 0 No Yes Yes Yes  Number falls in past yr: 0 - '1 1 1  ' Injury with Fall? 0 - No No No  Risk for fall due to : Impaired balance/gait - Other (Comment) Other (Comment) -  Follow up Falls evaluation completed - Education provided - -    COGNITIVE FUNCTION Mini-Cog - 08/26/18 0654    Normal clock drawing test?  yes    How many words  correct?  3      Objective:   Vitals:   08/26/18 0941  BP: 110/70  Pulse: 69  Temp: 97.9 F (36.6 C)  SpO2: 97%   Body mass index is 42.66 kg/m.  Physical Exam  Constitutional: She is oriented to person, place, and time. She appears well-developed and well-nourished. No distress.  HENT:  Head: Normocephalic and atraumatic.  Right Ear: External ear normal.  Left Ear: External ear normal.  Mouth/Throat: No oropharyngeal exudate.  Eyes: Pupils are equal, round, and reactive to light. Conjunctivae are normal.  Neck: Normal range of motion. Neck supple. No JVD present. No thyromegaly present.  Cardiovascular: Normal rate, regular rhythm and normal heart sounds. Exam reveals no gallop and no friction rub.  No murmur heard. Pulmonary/Chest: Effort normal and breath sounds normal. She has no wheezes. She exhibits no tenderness.  Abdominal: Soft. Bowel sounds are normal. She exhibits no mass. There is no abdominal tenderness. There is no rebound and no guarding.  Musculoskeletal: Normal range of motion.  Lymphadenopathy:    She has no cervical adenopathy.  Neurological: She is alert and oriented to person, place, and time. She has normal reflexes.  Skin: No rash noted.  Psychiatric: She has a normal mood and affect. Her behavior is normal. Judgment and thought content normal.   Timed Get Up and Go performed: yes, taking < 12 seconds Note: If takes > 12 seconds to complete, patient at increased risk of falling. Note if patient has slow pace, loss of balance, short strides, little or no arm swings, steadying self on walls, shuffling, not using assist device properly.   Assessment and Plan:   Tameko was seen today for annual exam.  Diagnoses and all orders for this visit:  Medicare annual wellness visit, subsequent  Alkaline phosphatase elevation -     Comp Met (CMET) -     Gamma GT  Primary osteoarthritis involving multiple joints Comments: . Orders: -      HYDROcodone-acetaminophen (NORCO) 10-325 MG tablet; Take 1 tablet by mouth every 8 (eight) hours as needed.  History of cholecystectomy  B12 deficiency -     Cyanocobalamin (B-12) 1000 MCG SUBL; Place 1 tablet under the tongue daily.  Vitamin D deficiency -     VITAMIN D 25 Hydroxy (Vit-D Deficiency, Fractures)  History of gastric bypass -     Multiple Vitamins-Minerals (BARIATRIC MULTIVITAMINS/IRON) CAPS; Take 1 tablet by mouth daily.  Coronary artery disease involving native coronary artery of native heart without angina pectoris -     nitroGLYCERIN (NITROLINGUAL) 0.4 MG/SPRAY spray; Place 1 spray under the tongue every 5 (five) minutes x 3 doses as needed for chest pain.  Immunization History  Administered Date(s) Administered  . Influenza Split 01/21/2011  . Influenza Whole 11/19/2007  . Influenza, High Dose Seasonal PF 10/16/2015, 11/12/2016, 11/07/2017  . Influenza-Unspecified 12/18/2013  . Pneumococcal Conjugate-13 08/26/2013  . Pneumococcal Polysaccharide-23 02/04/2003, 06/08/2015  . Td 02/03/2005  . Zoster Recombinat (Shingrix) 02/18/2017, 07/19/2017   Screening Tests Health Maintenance  Topic Date Due  . FOOT EXAM  07/04/2018  . INFLUENZA VACCINE  09/04/2018  . HEMOGLOBIN A1C  02/10/2019  . OPHTHALMOLOGY EXAM  07/21/2019  . MAMMOGRAM  03/29/2020  . COLONOSCOPY  04/29/2020  . Hepatitis C Screening  Completed  . PNA vac Low Risk Adult  Completed  . DEXA SCAN  Addressed  . TETANUS/TDAP  Discontinued   Cancer Screenings: Lung: Low Dose CT Chest recommended if Age 17-80 years, 30 pack-year currently smoking OR have quit w/in 15years. Patient does qualify - already plugged into lung cancer program. Breast:  Up to date on Mammogram? Yes   Up to date of Bone Density/Dexa? Yes  Advanced Directives has an advanced directive - a copy HAS NOT been provided.  I have personally reviewed and noted the following in the patient's chart:   .  Medical and social  history . Use of alcohol, tobacco or illicit drugs  . Current medications and supplements . Functional ability and status . Nutritional status . Physical activity . Advanced directives . List of other physicians . Hospitalizations, surgeries, and ER visits in previous 12 months . Vitals . Screenings to include cognitive, depression, and falls . Referrals and appointments  In addition, I have reviewed and discussed with patient certain preventive protocols, quality metrics, and best practice recommendations. A personalized care plan for preventive services as well as general preventive health recommendations were provided to patient.   Briscoe Deutscher, DO

## 2018-08-26 NOTE — Patient Instructions (Signed)
Health Maintenance Due  Topic Date Due  . FOOT EXAM  07/04/2018    Depression screen [facility] 2/9 07/03/2017 03/10/2016 12/03/2015  Decreased Interest 0 0 0  Down, Depressed, Hopeless 0 0 0  PHQ - 2 Score 0 0 0  Altered sleeping - - -  Tired, decreased energy - - -  Change in appetite - - -  Feeling bad or failure about yourself  - - -  Trouble concentrating - - -  Moving slowly or fidgety/restless - - -  Suicidal thoughts - - -  PHQ-9 Score - - -  Difficult doing work/chores - - -  Some recent data might be hidden

## 2018-08-27 ENCOUNTER — Telehealth: Payer: Self-pay | Admitting: Family Medicine

## 2018-08-27 NOTE — Telephone Encounter (Signed)
Called patient has been having increased back pain. She has had to take medications more often. She is not interested in PT, or injections. She has called insurance and they will cover chiropractor and acupuncture but will not cover massage therapy she would like to know if you recommend one over the other. If so referral will need to be placed.

## 2018-08-27 NOTE — Telephone Encounter (Signed)
See note  Copied from [place] [phone]. Topic: Referral - Request for Referral >> Aug 27, 2018  8:51 AM Virl Axe D wrote: Has patient seen PCP for this complaint? Yes *If NO, is insurance requiring patient see PCP for this issue before PCP can refer them? Referral for which specialty: Acupuncture Preferred provider/office: In network Reason for referral: Lower back pain

## 2018-08-28 NOTE — Telephone Encounter (Signed)
Okay chiropractor. Please place referral.

## 2018-08-30 ENCOUNTER — Other Ambulatory Visit: Payer: Self-pay

## 2018-08-30 NOTE — Telephone Encounter (Signed)
Called patient she states that she did not need referral just name of one in the area. I have provided with that she will call if any questions.

## 2018-09-05 ENCOUNTER — Encounter: Payer: Self-pay | Admitting: Family Medicine

## 2018-09-07 ENCOUNTER — Other Ambulatory Visit: Payer: Self-pay

## 2018-09-10 NOTE — Telephone Encounter (Signed)
Spoke with patient and sent a message to Dr. Juleen China for clarification of supplements.

## 2018-09-13 ENCOUNTER — Other Ambulatory Visit: Payer: Self-pay

## 2018-09-13 MED ORDER — IRON 325 (65 FE) MG PO TABS: 1.0000 | ORAL_TABLET | Freq: Every day | ORAL | 1 refills | Status: DC

## 2018-09-13 MED ORDER — VITAMIN D 50 MCG (2000 UT) PO TABS: 2000.0000 [IU] | ORAL_TABLET | Freq: Every day | ORAL | 1 refills | Status: DC

## 2018-09-13 NOTE — Telephone Encounter (Signed)
Called both in

## 2018-09-24 ENCOUNTER — Other Ambulatory Visit: Payer: Self-pay | Admitting: Cardiovascular Disease

## 2018-09-29 ENCOUNTER — Other Ambulatory Visit: Payer: Self-pay | Admitting: Family Medicine

## 2018-09-29 DIAGNOSIS — M5441 Lumbago with sciatica, right side: Secondary | ICD-10-CM

## 2018-09-29 DIAGNOSIS — G8929 Other chronic pain: Secondary | ICD-10-CM

## 2018-09-30 ENCOUNTER — Telehealth: Payer: Self-pay | Admitting: Family Medicine

## 2018-09-30 NOTE — Telephone Encounter (Signed)
FYI

## 2018-09-30 NOTE — Telephone Encounter (Signed)
See note  Copied from [place] [phone]. Topic: General - Other >> Sep 30, 2018 12:00 PM Rainey Pines A wrote: Dorris Carnes from [facility] called to inform provider that she hasnt been out to see patient due to her worrying about covid and they are working in conjunction with [employer].

## 2018-10-07 ENCOUNTER — Ambulatory Visit (INDEPENDENT_AMBULATORY_CARE_PROVIDER_SITE_OTHER): Payer: PPO

## 2018-10-07 ENCOUNTER — Encounter: Payer: Self-pay | Admitting: Family Medicine

## 2018-10-07 ENCOUNTER — Other Ambulatory Visit: Payer: Self-pay

## 2018-10-07 DIAGNOSIS — Z23 Encounter for immunization: Secondary | ICD-10-CM | POA: Diagnosis not present

## 2018-10-07 DIAGNOSIS — M546 Pain in thoracic spine: Secondary | ICD-10-CM | POA: Diagnosis not present

## 2018-10-07 DIAGNOSIS — M9901 Segmental and somatic dysfunction of cervical region: Secondary | ICD-10-CM | POA: Diagnosis not present

## 2018-10-07 DIAGNOSIS — M9902 Segmental and somatic dysfunction of thoracic region: Secondary | ICD-10-CM | POA: Diagnosis not present

## 2018-10-07 DIAGNOSIS — M62830 Muscle spasm of back: Secondary | ICD-10-CM | POA: Diagnosis not present

## 2018-10-12 DIAGNOSIS — M9901 Segmental and somatic dysfunction of cervical region: Secondary | ICD-10-CM | POA: Diagnosis not present

## 2018-10-12 DIAGNOSIS — M62830 Muscle spasm of back: Secondary | ICD-10-CM | POA: Diagnosis not present

## 2018-10-12 DIAGNOSIS — M546 Pain in thoracic spine: Secondary | ICD-10-CM | POA: Diagnosis not present

## 2018-10-12 DIAGNOSIS — M9902 Segmental and somatic dysfunction of thoracic region: Secondary | ICD-10-CM | POA: Diagnosis not present

## 2018-10-13 ENCOUNTER — Telehealth: Payer: Self-pay | Admitting: Family Medicine

## 2018-10-13 ENCOUNTER — Other Ambulatory Visit: Payer: Self-pay

## 2018-10-13 DIAGNOSIS — M546 Pain in thoracic spine: Secondary | ICD-10-CM | POA: Diagnosis not present

## 2018-10-13 DIAGNOSIS — M9902 Segmental and somatic dysfunction of thoracic region: Secondary | ICD-10-CM | POA: Diagnosis not present

## 2018-10-13 DIAGNOSIS — M62830 Muscle spasm of back: Secondary | ICD-10-CM | POA: Diagnosis not present

## 2018-10-13 DIAGNOSIS — M9901 Segmental and somatic dysfunction of cervical region: Secondary | ICD-10-CM | POA: Diagnosis not present

## 2018-10-13 MED ORDER — IRON 325 (65 FE) MG PO TABS: ORAL_TABLET | ORAL | 1 refills | Status: DC

## 2018-10-13 NOTE — Telephone Encounter (Signed)
Ok to change

## 2018-10-13 NOTE — Telephone Encounter (Signed)
Called let pt know new script sent in.

## 2018-10-13 NOTE — Telephone Encounter (Signed)
Pt has an Rx for Ferrous Sulfate (IRON) 325 (65 Fe) MG TABS But has been having side effects and wanted this reduced to 1x every other day /Pt didn't specify reactions to [place] advise

## 2018-10-13 NOTE — Telephone Encounter (Signed)
Of course!

## 2018-10-13 NOTE — Telephone Encounter (Signed)
See note

## 2018-10-14 DIAGNOSIS — M62830 Muscle spasm of back: Secondary | ICD-10-CM | POA: Diagnosis not present

## 2018-10-14 DIAGNOSIS — M546 Pain in thoracic spine: Secondary | ICD-10-CM | POA: Diagnosis not present

## 2018-10-14 DIAGNOSIS — M9902 Segmental and somatic dysfunction of thoracic region: Secondary | ICD-10-CM | POA: Diagnosis not present

## 2018-10-14 DIAGNOSIS — M9901 Segmental and somatic dysfunction of cervical region: Secondary | ICD-10-CM | POA: Diagnosis not present

## 2018-10-18 DIAGNOSIS — M9902 Segmental and somatic dysfunction of thoracic region: Secondary | ICD-10-CM | POA: Diagnosis not present

## 2018-10-18 DIAGNOSIS — M62830 Muscle spasm of back: Secondary | ICD-10-CM | POA: Diagnosis not present

## 2018-10-18 DIAGNOSIS — M9901 Segmental and somatic dysfunction of cervical region: Secondary | ICD-10-CM | POA: Diagnosis not present

## 2018-10-18 DIAGNOSIS — M546 Pain in thoracic spine: Secondary | ICD-10-CM | POA: Diagnosis not present

## 2018-10-20 DIAGNOSIS — M9901 Segmental and somatic dysfunction of cervical region: Secondary | ICD-10-CM | POA: Diagnosis not present

## 2018-10-20 DIAGNOSIS — M546 Pain in thoracic spine: Secondary | ICD-10-CM | POA: Diagnosis not present

## 2018-10-20 DIAGNOSIS — M62830 Muscle spasm of back: Secondary | ICD-10-CM | POA: Diagnosis not present

## 2018-10-20 DIAGNOSIS — M9902 Segmental and somatic dysfunction of thoracic region: Secondary | ICD-10-CM | POA: Diagnosis not present

## 2018-10-21 DIAGNOSIS — M546 Pain in thoracic spine: Secondary | ICD-10-CM | POA: Diagnosis not present

## 2018-10-21 DIAGNOSIS — M9901 Segmental and somatic dysfunction of cervical region: Secondary | ICD-10-CM | POA: Diagnosis not present

## 2018-10-21 DIAGNOSIS — M62830 Muscle spasm of back: Secondary | ICD-10-CM | POA: Diagnosis not present

## 2018-10-21 DIAGNOSIS — M9902 Segmental and somatic dysfunction of thoracic region: Secondary | ICD-10-CM | POA: Diagnosis not present

## 2018-10-22 ENCOUNTER — Other Ambulatory Visit: Payer: Self-pay | Admitting: Family Medicine

## 2018-10-22 ENCOUNTER — Other Ambulatory Visit: Payer: Self-pay | Admitting: Gastroenterology

## 2018-10-22 DIAGNOSIS — G8929 Other chronic pain: Secondary | ICD-10-CM

## 2018-10-23 ENCOUNTER — Other Ambulatory Visit: Payer: Self-pay

## 2018-10-23 DIAGNOSIS — R600 Localized edema: Secondary | ICD-10-CM

## 2018-10-23 DIAGNOSIS — I2510 Atherosclerotic heart disease of native coronary artery without angina pectoris: Secondary | ICD-10-CM

## 2018-10-23 MED ORDER — POTASSIUM CHLORIDE ER 10 MEQ PO TBCR: 10.0000 meq | EXTENDED_RELEASE_TABLET | Freq: Two times a day (BID) | ORAL | 1 refills | Status: DC

## 2018-10-23 MED ORDER — NITROGLYCERIN 0.4 MG/SPRAY TL SOLN: 1.0000 | 3 refills | Status: DC | PRN

## 2018-10-23 MED ORDER — IRON 325 (65 FE) MG PO TABS: ORAL_TABLET | ORAL | 1 refills | Status: DC

## 2018-10-25 DIAGNOSIS — M62830 Muscle spasm of back: Secondary | ICD-10-CM | POA: Diagnosis not present

## 2018-10-25 DIAGNOSIS — M546 Pain in thoracic spine: Secondary | ICD-10-CM | POA: Diagnosis not present

## 2018-10-25 DIAGNOSIS — M9901 Segmental and somatic dysfunction of cervical region: Secondary | ICD-10-CM | POA: Diagnosis not present

## 2018-10-25 DIAGNOSIS — M9902 Segmental and somatic dysfunction of thoracic region: Secondary | ICD-10-CM | POA: Diagnosis not present

## 2018-10-27 DIAGNOSIS — M546 Pain in thoracic spine: Secondary | ICD-10-CM | POA: Diagnosis not present

## 2018-10-27 DIAGNOSIS — M9902 Segmental and somatic dysfunction of thoracic region: Secondary | ICD-10-CM | POA: Diagnosis not present

## 2018-10-27 DIAGNOSIS — M9901 Segmental and somatic dysfunction of cervical region: Secondary | ICD-10-CM | POA: Diagnosis not present

## 2018-10-27 DIAGNOSIS — M62830 Muscle spasm of back: Secondary | ICD-10-CM | POA: Diagnosis not present

## 2018-10-28 DIAGNOSIS — M9901 Segmental and somatic dysfunction of cervical region: Secondary | ICD-10-CM | POA: Diagnosis not present

## 2018-10-28 DIAGNOSIS — M546 Pain in thoracic spine: Secondary | ICD-10-CM | POA: Diagnosis not present

## 2018-10-28 DIAGNOSIS — M9902 Segmental and somatic dysfunction of thoracic region: Secondary | ICD-10-CM | POA: Diagnosis not present

## 2018-10-28 DIAGNOSIS — M62830 Muscle spasm of back: Secondary | ICD-10-CM | POA: Diagnosis not present

## 2018-11-02 DIAGNOSIS — M546 Pain in thoracic spine: Secondary | ICD-10-CM | POA: Diagnosis not present

## 2018-11-02 DIAGNOSIS — M9902 Segmental and somatic dysfunction of thoracic region: Secondary | ICD-10-CM | POA: Diagnosis not present

## 2018-11-02 DIAGNOSIS — M62830 Muscle spasm of back: Secondary | ICD-10-CM | POA: Diagnosis not present

## 2018-11-02 DIAGNOSIS — M9901 Segmental and somatic dysfunction of cervical region: Secondary | ICD-10-CM | POA: Diagnosis not present

## 2018-11-03 DIAGNOSIS — M9901 Segmental and somatic dysfunction of cervical region: Secondary | ICD-10-CM | POA: Diagnosis not present

## 2018-11-03 DIAGNOSIS — M62830 Muscle spasm of back: Secondary | ICD-10-CM | POA: Diagnosis not present

## 2018-11-03 DIAGNOSIS — M9902 Segmental and somatic dysfunction of thoracic region: Secondary | ICD-10-CM | POA: Diagnosis not present

## 2018-11-03 DIAGNOSIS — M546 Pain in thoracic spine: Secondary | ICD-10-CM | POA: Diagnosis not present

## 2018-11-08 ENCOUNTER — Telehealth: Payer: Self-pay | Admitting: Family Medicine

## 2018-11-08 NOTE — Telephone Encounter (Signed)
See note

## 2018-11-08 NOTE — Telephone Encounter (Signed)
Medication Refill - Medication: valsartan (DIOVAN) 320 MG tablet    Has the patient contacted their pharmacy? Yes.   (Agent: If no, request that the patient contact the pharmacy for the refill.) (Agent: If yes, when and what did the pharmacy advise?)  Preferred Pharmacy (with phone number or street name):  [place] [facility], [place] [address] [phone] (Phone) [phone] (Fax)     Agent: Please be advised that RX refills may take up to 3 business days. We ask that you follow-up with your pharmacy.

## 2018-11-09 DIAGNOSIS — M9901 Segmental and somatic dysfunction of cervical region: Secondary | ICD-10-CM | POA: Diagnosis not present

## 2018-11-09 DIAGNOSIS — M9902 Segmental and somatic dysfunction of thoracic region: Secondary | ICD-10-CM | POA: Diagnosis not present

## 2018-11-09 DIAGNOSIS — M546 Pain in thoracic spine: Secondary | ICD-10-CM | POA: Diagnosis not present

## 2018-11-09 DIAGNOSIS — M62830 Muscle spasm of back: Secondary | ICD-10-CM | POA: Diagnosis not present

## 2018-11-10 ENCOUNTER — Ambulatory Visit: Payer: PPO | Admitting: Family Medicine

## 2018-11-10 ENCOUNTER — Other Ambulatory Visit: Payer: Self-pay

## 2018-11-10 MED ORDER — VALSARTAN 320 MG PO TABS: 160.0000 mg | ORAL_TABLET | Freq: Every day | ORAL | 1 refills | Status: DC

## 2018-11-10 NOTE — Telephone Encounter (Signed)
Last oV 08/26/18 Last fill 07/28/18 #90/1

## 2018-11-10 NOTE — Telephone Encounter (Signed)
Refill request sent to pharmacy of pt's preference.

## 2018-11-11 DIAGNOSIS — M9901 Segmental and somatic dysfunction of cervical region: Secondary | ICD-10-CM | POA: Diagnosis not present

## 2018-11-11 DIAGNOSIS — M546 Pain in thoracic spine: Secondary | ICD-10-CM | POA: Diagnosis not present

## 2018-11-11 DIAGNOSIS — M62830 Muscle spasm of back: Secondary | ICD-10-CM | POA: Diagnosis not present

## 2018-11-11 DIAGNOSIS — M9902 Segmental and somatic dysfunction of thoracic region: Secondary | ICD-10-CM | POA: Diagnosis not present

## 2018-11-12 NOTE — Telephone Encounter (Signed)
See note

## 2018-11-12 NOTE — Telephone Encounter (Addendum)
Patient states [facility] denies receiving Rx and would like Rx sent in today, please advise patient directly. As per patient must be 30 day supply valsartan (DIOVAN) 320 MG tablet

## 2018-11-14 NOTE — Progress Notes (Signed)
Tricia [name] is a 73 y.o. female is here for follow up.  History of Present Illness:   [name], CMA acting as scribe for Dr. Briscoe Deutscher.    HPI: Patient states that she has been doing well. Has increased weight gain recently. She is not able to exercise due to back pain. She has been having increased portions and carb intake.   Lower extremity edema has worsened slightly.  She has not taken her diuretic today.  Blood pressure up slightly today.  She does feel that the Lasix generally improves her edema but does not resolve it.  She does try to elevate her legs.  She bought compression hose but they are too tight.  Patient was unable to tolerate hydrocodone.  It had started causing nausea and vomiting when she took the medicine.  She has been off of this for 6 weeks.  She has been going to a chiropractor 2-3 times per week but does not feel that it is been that helpful.  She did have a deep tissue massage recently that hurt for a few days after but does seem to have helped slightly.  Unfortunately, at this point she is unable to walk for more than 10 minutes without being in pain.  She is interested and following up at healthy weight and wellness.  History of bariatric surgery.  Health Maintenance Due  Topic Date Due  . FOOT EXAM  07/04/2018   Depression screen [facility] 2/9 08/26/2018 07/03/2017 03/10/2016  Decreased Interest 0 0 0  Down, Depressed, Hopeless 0 0 0  PHQ - 2 Score 0 0 0  Altered sleeping 0 - -  Tired, decreased energy 3 - -  Change in appetite 0 - -  Feeling bad or failure about yourself  0 - -  Trouble concentrating 0 - -  Moving slowly or fidgety/restless 0 - -  Suicidal thoughts 0 - -  PHQ-9 Score 3 - -  Difficult doing work/chores Somewhat difficult - -  Some recent data might be hidden   PMHx, SurgHx, SocialHx, FamHx, Medications, and Allergies were reviewed in the Visit Navigator and updated as appropriate.   Patient Active Problem List   Diagnosis  Date Noted  . Bilateral lower extremity edema 11/21/2018  . Class 3 obesity with alveolar hypoventilation, serious comorbidity, and body mass index (BMI) of 45.0 to 49.9 in adult ([place]) 11/21/2018  . History of cholecystectomy 08/26/2018  . PVC's (premature ventricular contractions) 04/16/2017  . Hyperparathyroidism, secondary ([place]) 07/06/2016  . Right renal atrophy 04/19/2016  . COPD (chronic obstructive pulmonary disease) ([place]) 04/19/2016  . Aortic atherosclerosis ([place]) 04/19/2016  . Atherosclerosis of arteries 04/19/2016  . Sleep apnea, not on CPAP   . Diverticulosis   . CAD (coronary artery disease) 03/10/2016  . Rib pain - s/p CPR 10/08/2015  . History of cardiac arrest 10/01/2015  . History of ST elevation myocardial infarction (STEMI) 10/01/2015  . Postoperative malabsorption 07/27/2015  . GERD (gastroesophageal reflux disease), Rx Protonix 02/14/2014  . History of gastric bypass 02/14/2014  . Former smoker, 50 pack years, quit 2010 02/14/2014  . Type 2 diabetes mellitus with other specified complication ([place]) 123456  . Anemia, B12 deficiency 04/04/2009  . Osteoporosis, Dx in 2018, Rx Prolia 12/14/2006  . Hyperlipidemia associated with type 2 diabetes mellitus ([place]), LDL goal < 70, Rx Lipitor 09/22/2006  . Hypertension associated with diabetes ([place]) 09/22/2006  . Osteoarthritis 09/22/2006  . 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  Adenomatous polyp of colon 02/04/2004   Social History   Tobacco Use  . Smoking status: Former Smoker    Packs/day: 2.00    Years: 41.00    Pack years: 82.00    Types: Cigarettes    Quit date: 03/06/2012    Years since quitting: 6.7  . Smokeless tobacco: Never Used  Substance Use Topics  . Alcohol use: No    Alcohol/week: 0.0 standard drinks  . Drug use: No   Current Medications and Allergies   Current Outpatient Medications:  .  albuterol (PROVENTIL HFA;VENTOLIN HFA) 108 (90 Base) MCG/ACT inhaler, Inhale 2  puffs into the lungs every 6 (six) hours as needed for wheezing or shortness of breath., Disp: 3 Inhaler, Rfl: 0 .  amLODipine (NORVASC) 5 MG tablet, Take 1 tablet (5 mg total) by mouth daily., Disp: 90 tablet, Rfl: 2 .  aspirin EC 81 MG tablet, Take 81 mg by mouth daily., Disp: , Rfl:  .  carvedilol (COREG) 3.125 MG tablet, Pt to take 3.125 mg with the carvedilol 6.25 mg for a total of 9.375 Twice a day., Disp: 180 tablet, Rfl: 2 .  carvedilol (COREG) 6.25 MG tablet, Pt to take carvedilol 6.25 mg with the carvedilol 3.125 mg for a total of 9.375 mg twice da day., Disp: 180 tablet, Rfl: 2 .  Cholecalciferol (VITAMIN D) 50 MCG (2000 UT) tablet, Take 1 tablet (2,000 Units total) by mouth daily., Disp: 90 tablet, Rfl: 1 .  clopidogrel (PLAVIX) 75 MG tablet, Take 1 tablet (75 mg total) by mouth daily., Disp: 90 tablet, Rfl: 3 .  Cyanocobalamin (B-12) 1000 MCG SUBL, Place 1 tablet under the tongue daily., Disp: 30 tablet, Rfl: 3 .  denosumab (PROLIA) 60 MG/ML SOSY injection, Inject 60 mg into the skin every 6 (six) months., Disp: , Rfl:  .  Ferrous Sulfate (IRON) 325 (65 Fe) MG TABS, 1 tab every other day, Disp: 45 tablet, Rfl: 1 .  glucose blood (FREESTYLE LITE) test strip, TEST UP TO FOUR TIMES DAILY Dx: E11.22, Disp: 360 each, Rfl: 5 .  Lancets (FREESTYLE) lancets, TEST UP TO FOUR TIMES DAILY Dx: E11.22, Disp: 360 each, Rfl: 5 .  Multiple Vitamins-Minerals (BARIATRIC MULTIVITAMINS/IRON) CAPS, Take 1 tablet by mouth daily., Disp: 90 capsule, Rfl: 3 .  nitroGLYCERIN (NITROLINGUAL) 0.4 MG/SPRAY spray, Place 1 spray under the tongue every 5 (five) minutes x 3 doses as needed for chest pain., Disp: 4.9 g, Rfl: 3 .  potassium chloride (K-DUR) 10 MEQ tablet, Take 1 tablet (10 mEq total) by mouth 2 (two) times daily., Disp: 90 tablet, Rfl: 1 .  atorvastatin (LIPITOR) 80 MG tablet, Take 1 tablet by mouth daily., Disp: 90 tablet, Rfl: 0 .  baclofen (LIORESAL) 10 MG tablet, Take 1 tablet by mouth twice daily.,  Disp: 60 tablet, Rfl: 0 .  furosemide (LASIX) 20 MG tablet, Take 1-2 tablets (20-40 mg total) by mouth daily., Disp: 90 tablet, Rfl: 0 .  gabapentin (NEURONTIN) 100 MG capsule, Take 1 capsule by mouth three times daily., Disp: 270 capsule, Rfl: 0 .  pantoprazole (PROTONIX) 40 MG tablet, Take 1 tablet by mouth twice daily before meals. **Needs appointment for further refills.**, Disp: 60 tablet, Rfl: 0 .  valsartan (DIOVAN) 320 MG tablet, Take 0.5 tablets (160 mg total) by mouth daily., Disp: 90 tablet, Rfl: 1   Allergies  Allergen Reactions  . Sulfa Antibiotics Diarrhea and Nausea And Vomiting   Review of Systems   Pertinent items are noted in the HPI.  Otherwise, a complete ROS is negative.  Vitals   Vitals:   11/15/18 0939  BP: (!) 160/80  Pulse: 82  Temp: 98 F (36.7 C)  TempSrc: Temporal  SpO2: 96%  Weight: 240 lb 9.6 oz (109.1 kg)  Height: 5' 1.3" (1.557 m)     Body mass index is 45.02 kg/m.  Physical Exam   Physical Exam Vitals signs and nursing note reviewed.  HENT:     Head: Normocephalic and atraumatic.  Eyes:     Pupils: Pupils are equal, round, and reactive to light.  Neck:     Musculoskeletal: Normal range of motion and neck supple.  Cardiovascular:     Rate and Rhythm: Normal rate and regular rhythm.     Heart sounds: Normal heart sounds.  Pulmonary:     Effort: Pulmonary effort is normal.  Abdominal:     Palpations: Abdomen is soft.  Musculoskeletal:     Right lower leg: Edema present.     Left lower leg: Edema present.  Skin:    General: Skin is warm.  Psychiatric:        Behavior: Behavior normal.    Diabetic Foot Exam - Simple   Simple Foot Form Diabetic Foot exam was performed with the following findings: Yes 11/15/2018  2:28 PM  Visual Inspection No deformities, no ulcerations, no other skin breakdown bilaterally: Yes Sensation Testing Intact to touch and monofilament testing bilaterally: Yes Pulse Check Posterior Tibialis and  Dorsalis pulse intact bilaterally: Yes Comments     Assessment and Plan   Trae was seen today for follow-up.  Diagnoses and all orders for this visit:  History of gastric bypass  Hypertension associated with diabetes ([place])  Hyperlipidemia associated with type 2 diabetes mellitus ([place])  Type 2 diabetes mellitus with other specified complication, without long-term current use of insulin ([place])  Former smoker, 50 pack years, quit 2010  Anemia, B12 deficiency  Localized osteoporosis without current pathological fracture  Postoperative malabsorption  Gastroesophageal reflux disease without esophagitis  Bilateral lower extremity edema  Class 3 obesity with alveolar hypoventilation, serious comorbidity, and body mass index (BMI) of 45.0 to 49.9 in adult [facility])  Aortic atherosclerosis ([place])  Hyperparathyroidism, secondary ([place])  Coronary artery disease involving native coronary artery of native heart with angina pectoris ([place])    . Orders and follow up as documented in [place], reviewed diet, exercise and weight control, cardiovascular risk and specific lipid/LDL goals reviewed, reviewed medications and side effects in detail.  . Reviewed expectations re: course of current medical issues. . Outlined signs and symptoms indicating need for more acute intervention. . Patient verbalized understanding and all questions were answered. . Patient received an After Visit Summary.  CMA served as Education administrator during this visit. History, Physical, and Plan performed by medical provider. The above documentation has been reviewed and is accurate and complete. Briscoe Deutscher, D.O.  Briscoe Deutscher, DO [place], [facility] 11/21/2018

## 2018-11-15 ENCOUNTER — Other Ambulatory Visit: Payer: Self-pay | Admitting: Family Medicine

## 2018-11-15 ENCOUNTER — Ambulatory Visit (INDEPENDENT_AMBULATORY_CARE_PROVIDER_SITE_OTHER): Payer: PPO | Admitting: Family Medicine

## 2018-11-15 ENCOUNTER — Encounter: Payer: Self-pay | Admitting: Family Medicine

## 2018-11-15 VITALS — BP 160/80 | HR 82 | Temp 98.0°F | Ht 61.3 in | Wt 240.6 lb

## 2018-11-15 DIAGNOSIS — I700 Atherosclerosis of aorta: Secondary | ICD-10-CM

## 2018-11-15 DIAGNOSIS — K219 Gastro-esophageal reflux disease without esophagitis: Secondary | ICD-10-CM

## 2018-11-15 DIAGNOSIS — E66813 Obesity, class 3: Secondary | ICD-10-CM

## 2018-11-15 DIAGNOSIS — E662 Morbid (severe) obesity with alveolar hypoventilation: Secondary | ICD-10-CM

## 2018-11-15 DIAGNOSIS — M816 Localized osteoporosis [Lequesne]: Secondary | ICD-10-CM | POA: Diagnosis not present

## 2018-11-15 DIAGNOSIS — N2581 Secondary hyperparathyroidism of renal origin: Secondary | ICD-10-CM

## 2018-11-15 DIAGNOSIS — E785 Hyperlipidemia, unspecified: Secondary | ICD-10-CM

## 2018-11-15 DIAGNOSIS — K912 Postsurgical malabsorption, not elsewhere classified: Secondary | ICD-10-CM

## 2018-11-15 DIAGNOSIS — E1169 Type 2 diabetes mellitus with other specified complication: Secondary | ICD-10-CM | POA: Diagnosis not present

## 2018-11-15 DIAGNOSIS — Z6841 Body Mass Index (BMI) 40.0 and over, adult: Secondary | ICD-10-CM

## 2018-11-15 DIAGNOSIS — R600 Localized edema: Secondary | ICD-10-CM

## 2018-11-15 DIAGNOSIS — Z9884 Bariatric surgery status: Secondary | ICD-10-CM

## 2018-11-15 DIAGNOSIS — E1159 Type 2 diabetes mellitus with other circulatory complications: Secondary | ICD-10-CM

## 2018-11-15 DIAGNOSIS — Z87891 Personal history of nicotine dependence: Secondary | ICD-10-CM | POA: Diagnosis not present

## 2018-11-15 DIAGNOSIS — I152 Hypertension secondary to endocrine disorders: Secondary | ICD-10-CM

## 2018-11-15 DIAGNOSIS — D518 Other vitamin B12 deficiency anemias: Secondary | ICD-10-CM

## 2018-11-15 DIAGNOSIS — I10 Essential (primary) hypertension: Secondary | ICD-10-CM

## 2018-11-15 DIAGNOSIS — I25119 Atherosclerotic heart disease of native coronary artery with unspecified angina pectoris: Secondary | ICD-10-CM

## 2018-11-15 MED ORDER — VALSARTAN 320 MG PO TABS: 160.0000 mg | ORAL_TABLET | Freq: Every day | ORAL | 1 refills | Status: DC

## 2018-11-15 NOTE — Telephone Encounter (Signed)
Pharmacy didn't receive script. Will resend

## 2018-11-15 NOTE — Telephone Encounter (Signed)
Medication Refill - Medication: valsartan (DIOVAN) 320 MG tablet    Preferred Pharmacy (with phone number or street name):  [place], [place] [phone] (Phone) [phone] (Fax)

## 2018-11-15 NOTE — Patient Instructions (Signed)
  Break Through for pain management  Chiropractor - [facility] Dr. Celene Kras    [address] [place], [place] [address]   Increase Lasix to 40mg  daily

## 2018-11-16 DIAGNOSIS — M62830 Muscle spasm of back: Secondary | ICD-10-CM | POA: Diagnosis not present

## 2018-11-16 DIAGNOSIS — M9901 Segmental and somatic dysfunction of cervical region: Secondary | ICD-10-CM | POA: Diagnosis not present

## 2018-11-16 DIAGNOSIS — M9902 Segmental and somatic dysfunction of thoracic region: Secondary | ICD-10-CM | POA: Diagnosis not present

## 2018-11-16 DIAGNOSIS — M546 Pain in thoracic spine: Secondary | ICD-10-CM | POA: Diagnosis not present

## 2018-11-18 ENCOUNTER — Other Ambulatory Visit: Payer: Self-pay | Admitting: Physician Assistant

## 2018-11-18 ENCOUNTER — Other Ambulatory Visit: Payer: Self-pay | Admitting: Gastroenterology

## 2018-11-18 ENCOUNTER — Other Ambulatory Visit: Payer: Self-pay | Admitting: Family Medicine

## 2018-11-18 DIAGNOSIS — M9902 Segmental and somatic dysfunction of thoracic region: Secondary | ICD-10-CM | POA: Diagnosis not present

## 2018-11-18 DIAGNOSIS — M62830 Muscle spasm of back: Secondary | ICD-10-CM | POA: Diagnosis not present

## 2018-11-18 DIAGNOSIS — M5441 Lumbago with sciatica, right side: Secondary | ICD-10-CM

## 2018-11-18 DIAGNOSIS — M546 Pain in thoracic spine: Secondary | ICD-10-CM | POA: Diagnosis not present

## 2018-11-18 DIAGNOSIS — M9901 Segmental and somatic dysfunction of cervical region: Secondary | ICD-10-CM | POA: Diagnosis not present

## 2018-11-18 DIAGNOSIS — G8929 Other chronic pain: Secondary | ICD-10-CM

## 2018-11-18 DIAGNOSIS — R600 Localized edema: Secondary | ICD-10-CM

## 2018-11-21 ENCOUNTER — Encounter: Payer: Self-pay | Admitting: Family Medicine

## 2018-11-21 DIAGNOSIS — R600 Localized edema: Secondary | ICD-10-CM | POA: Insufficient documentation

## 2018-11-21 DIAGNOSIS — E662 Morbid (severe) obesity with alveolar hypoventilation: Secondary | ICD-10-CM | POA: Insufficient documentation

## 2018-11-23 DIAGNOSIS — M9902 Segmental and somatic dysfunction of thoracic region: Secondary | ICD-10-CM | POA: Diagnosis not present

## 2018-11-23 DIAGNOSIS — M9901 Segmental and somatic dysfunction of cervical region: Secondary | ICD-10-CM | POA: Diagnosis not present

## 2018-11-23 DIAGNOSIS — M546 Pain in thoracic spine: Secondary | ICD-10-CM | POA: Diagnosis not present

## 2018-11-23 DIAGNOSIS — M62830 Muscle spasm of back: Secondary | ICD-10-CM | POA: Diagnosis not present

## 2018-11-25 DIAGNOSIS — M62830 Muscle spasm of back: Secondary | ICD-10-CM | POA: Diagnosis not present

## 2018-11-25 DIAGNOSIS — M9902 Segmental and somatic dysfunction of thoracic region: Secondary | ICD-10-CM | POA: Diagnosis not present

## 2018-11-25 DIAGNOSIS — M546 Pain in thoracic spine: Secondary | ICD-10-CM | POA: Diagnosis not present

## 2018-11-25 DIAGNOSIS — M9901 Segmental and somatic dysfunction of cervical region: Secondary | ICD-10-CM | POA: Diagnosis not present

## 2018-11-30 DIAGNOSIS — M9901 Segmental and somatic dysfunction of cervical region: Secondary | ICD-10-CM | POA: Diagnosis not present

## 2018-11-30 DIAGNOSIS — M546 Pain in thoracic spine: Secondary | ICD-10-CM | POA: Diagnosis not present

## 2018-11-30 DIAGNOSIS — M62830 Muscle spasm of back: Secondary | ICD-10-CM | POA: Diagnosis not present

## 2018-11-30 DIAGNOSIS — M9902 Segmental and somatic dysfunction of thoracic region: Secondary | ICD-10-CM | POA: Diagnosis not present

## 2018-12-02 ENCOUNTER — Other Ambulatory Visit: Payer: Self-pay | Admitting: Family Medicine

## 2018-12-24 ENCOUNTER — Other Ambulatory Visit: Payer: Self-pay | Admitting: Gastroenterology

## 2018-12-27 ENCOUNTER — Other Ambulatory Visit: Payer: Self-pay | Admitting: Family Medicine

## 2018-12-27 DIAGNOSIS — G8929 Other chronic pain: Secondary | ICD-10-CM

## 2018-12-27 DIAGNOSIS — M5441 Lumbago with sciatica, right side: Secondary | ICD-10-CM

## 2019-01-12 ENCOUNTER — Other Ambulatory Visit: Payer: Self-pay | Admitting: Family Medicine

## 2019-01-12 DIAGNOSIS — G8929 Other chronic pain: Secondary | ICD-10-CM

## 2019-01-12 MED ORDER — BACLOFEN 10 MG PO TABS: 10.0000 mg | ORAL_TABLET | Freq: Two times a day (BID) | ORAL | 0 refills | Status: DC

## 2019-01-17 ENCOUNTER — Other Ambulatory Visit: Payer: Self-pay | Admitting: Gastroenterology

## 2019-01-17 ENCOUNTER — Other Ambulatory Visit: Payer: Self-pay | Admitting: Family Medicine

## 2019-01-17 DIAGNOSIS — G8929 Other chronic pain: Secondary | ICD-10-CM

## 2019-01-17 DIAGNOSIS — M5441 Lumbago with sciatica, right side: Secondary | ICD-10-CM

## 2019-02-01 ENCOUNTER — Other Ambulatory Visit: Payer: Self-pay | Admitting: Family Medicine

## 2019-02-01 ENCOUNTER — Other Ambulatory Visit: Payer: Self-pay

## 2019-02-01 DIAGNOSIS — G8929 Other chronic pain: Secondary | ICD-10-CM

## 2019-02-01 DIAGNOSIS — M5441 Lumbago with sciatica, right side: Secondary | ICD-10-CM

## 2019-02-01 MED ORDER — BACLOFEN 10 MG PO TABS: 10.0000 mg | ORAL_TABLET | Freq: Two times a day (BID) | ORAL | 0 refills | Status: DC

## 2019-02-02 ENCOUNTER — Telehealth: Payer: Self-pay

## 2019-02-02 NOTE — Telephone Encounter (Signed)
Prolia approved for 20% out of pocket ($220).  Awaiting PA result.  Cover My Meds key [id number].

## 2019-02-16 ENCOUNTER — Other Ambulatory Visit: Payer: Self-pay | Admitting: Family Medicine

## 2019-02-16 ENCOUNTER — Other Ambulatory Visit: Payer: Self-pay | Admitting: Gastroenterology

## 2019-02-16 ENCOUNTER — Other Ambulatory Visit: Payer: Self-pay | Admitting: Physician Assistant

## 2019-02-16 ENCOUNTER — Other Ambulatory Visit: Payer: Self-pay | Admitting: Internal Medicine

## 2019-02-16 DIAGNOSIS — M5441 Lumbago with sciatica, right side: Secondary | ICD-10-CM

## 2019-02-16 DIAGNOSIS — G8929 Other chronic pain: Secondary | ICD-10-CM

## 2019-02-21 ENCOUNTER — Other Ambulatory Visit: Payer: Self-pay

## 2019-02-21 DIAGNOSIS — G8929 Other chronic pain: Secondary | ICD-10-CM

## 2019-02-21 DIAGNOSIS — M5441 Lumbago with sciatica, right side: Secondary | ICD-10-CM

## 2019-02-21 MED ORDER — GABAPENTIN 100 MG PO CAPS: ORAL_CAPSULE | ORAL | 0 refills | Status: DC

## 2019-03-02 ENCOUNTER — Other Ambulatory Visit: Payer: Self-pay

## 2019-03-03 ENCOUNTER — Ambulatory Visit (INDEPENDENT_AMBULATORY_CARE_PROVIDER_SITE_OTHER): Payer: PPO | Admitting: Family Medicine

## 2019-03-03 ENCOUNTER — Encounter: Payer: Self-pay | Admitting: Family Medicine

## 2019-03-03 VITALS — BP 152/70 | Ht 61.3 in | Wt 246.0 lb

## 2019-03-03 DIAGNOSIS — F339 Major depressive disorder, recurrent, unspecified: Secondary | ICD-10-CM

## 2019-03-03 DIAGNOSIS — E119 Type 2 diabetes mellitus without complications: Secondary | ICD-10-CM | POA: Diagnosis not present

## 2019-03-03 DIAGNOSIS — I10 Essential (primary) hypertension: Secondary | ICD-10-CM

## 2019-03-03 DIAGNOSIS — N2581 Secondary hyperparathyroidism of renal origin: Secondary | ICD-10-CM

## 2019-03-03 DIAGNOSIS — J449 Chronic obstructive pulmonary disease, unspecified: Secondary | ICD-10-CM

## 2019-03-03 DIAGNOSIS — M48061 Spinal stenosis, lumbar region without neurogenic claudication: Secondary | ICD-10-CM

## 2019-03-03 DIAGNOSIS — I25119 Atherosclerotic heart disease of native coronary artery with unspecified angina pectoris: Secondary | ICD-10-CM

## 2019-03-03 DIAGNOSIS — E1159 Type 2 diabetes mellitus with other circulatory complications: Secondary | ICD-10-CM | POA: Diagnosis not present

## 2019-03-03 DIAGNOSIS — E782 Mixed hyperlipidemia: Secondary | ICD-10-CM

## 2019-03-03 HISTORY — DX: Spinal stenosis, lumbar region without neurogenic claudication: M48.061

## 2019-03-03 MED ORDER — VITAMIN D 50 MCG (2000 UT) PO TABS: 2000.0000 [IU] | ORAL_TABLET | Freq: Every day | ORAL | 3 refills | Status: DC

## 2019-03-03 MED ORDER — VALSARTAN 320 MG PO TABS: 320.0000 mg | ORAL_TABLET | Freq: Every day | ORAL | 1 refills | Status: DC

## 2019-03-03 MED ORDER — SERTRALINE HCL 25 MG PO TABS: ORAL_TABLET | ORAL | 1 refills | Status: DC

## 2019-03-03 NOTE — Progress Notes (Signed)
Virtual Visit via Video Note  Subjective  CC:  Chief Complaint  Patient presents with  . Transitions Of Care    TOC from Dr. Juleen China  . Hypertension    Monitors reading at home. Readings are usually >140/90. Patient is currently taking medication daily.  . Hyperlipidemia    Patient takes medication as instructed. No side effects.  . Diabetes    Checks DM twice a day. It's usually around 110 in the mornings.     I connected with Tricia [name] on 03/04/19 at  9:20 AM EST by a video enabled telemedicine application and verified that I am speaking with the correct person using two identifiers. Location patient: Home Location provider: [facility] [place], Office Persons participating in the virtual visit: Corwin Levins, MD Serita Sheller, [place] discussed the limitations of evaluation and management by telemedicine and the availability of in person appointments. The patient expressed understanding and agreed to proceed. HPI: Tricia [name] is a 74 y.o. female who was contacted today to address the problems listed above in the chief complaint. . Very pleasant 74 year old widowed female who currently lives alone.  I reviewed her chart in detail.  Currently during interview reports she has been feeling down.  Isolation from the pandemic has worsened her mood.  She had been on Zoloft in the past after the death of her husband but she stopped that after doing well.  Over the last several months she has not been able to see her children.  Nor her grandchildren.  She feels down to be at times.  No suicidal ideation.  No panic symptoms.  Review willing to restart medication. . Chronic back pain due to spinal stenosis.  She has been evaluated by neurosurgery.  She is not currently interested in surgery.  She manages with medications.  She has been to physical therapy. Marland Kitchen COPD noted on CT scan but patient reports asymptomatic.  She is a former smoker . Chronic  hypertension currently marginally controlled.  She has been working with cardiology to resolve this.  She had been on medications that had been decreased due to hypotensive episodes.  She may have some autonomic dysfunction.  Currently home readings have been elevated.  She has no chest pain or shortness of breath she has follow-up with cardiology soon.  They did reinstitute her higher dose of calcium channel blocker.  Her volume status is normal.  She is not requiring Lasix or potassium. . Coronary disease status post stenting and MIs: Currently stable.  No history of CHF.  No chest pain. Marland Kitchen Hyperparathyroidism: Noted by lab work.  Will need follow-up.  No calcium problems.  Diabetes follow up: Her diabetic control is reported as Good..  She is a diet-controlled diabetic.  Most recent A1c was mildly elevated at 6.6. She denies exertional CP or SOB or symptomatic hypoglycemia. She denies foot sores or paresthesias.  Hyperlipidemia on high-dose statin with LDL less than 80.  Tolerates well.  Health maintenance: She is due for mammogram next month.  She is due for eye exam.  She will schedule. Immunization History  Administered Date(s) Administered  . Fluad Quad(high Dose 65+) 10/07/2018  . Influenza Split 01/21/2011  . Influenza Whole 11/19/2007  . Influenza, High Dose Seasonal PF 10/16/2015, 11/12/2016, 11/07/2017  . Influenza-Unspecified 12/18/2013  . Pneumococcal Conjugate-13 08/26/2013  . Pneumococcal Polysaccharide-23 02/04/2003, 06/08/2015  . Td 02/03/2005  . Zoster Recombinat (Shingrix) 02/18/2017, 07/19/2017  Diabetes Related Lab Review: Lab Results  Component Value Date   HGBA1C 6.6 (H) 08/10/2018   HGBA1C 6.3 07/03/2017   HGBA1C 6.0 02/18/2017    Lab Results  Component Value Date   MICROALBUR 4.2 (H) 08/19/2013   Lab Results  Component Value Date   CREATININE 1.03 08/26/2018   BUN 39 (H) 08/26/2018   NA 139 08/26/2018   K 4.9 08/26/2018   CL 106 08/26/2018   CO2 25  08/26/2018   Lab Results  Component Value Date   CHOL 143 08/10/2018   CHOL 162 07/03/2017   CHOL 122 06/18/2016   Lab Results  Component Value Date   HDL 41.70 08/10/2018   HDL 48.60 07/03/2017   HDL 51.20 06/18/2016   Lab Results  Component Value Date   LDLCALC 71 08/10/2018   LDLCALC 91 07/03/2017   LDLCALC 54 06/18/2016   Lab Results  Component Value Date   TRIG 155.0 (H) 08/10/2018   TRIG 115.0 07/03/2017   TRIG 82.0 06/18/2016   Lab Results  Component Value Date   CHOLHDL 3 08/10/2018   CHOLHDL 3 07/03/2017   CHOLHDL 2 06/18/2016   Lab Results  Component Value Date   LDLDIRECT 86.0 09/11/2015   LDLDIRECT 160.7 09/20/2012   LDLDIRECT 180.3 05/16/2011   The ASCVD Risk score (Goff DC Jr., et al., 2013) failed to calculate for the following reasons:   The patient has a prior MI or stroke diagnosis  BP Readings from Last 3 Encounters:  03/03/19 (!) 152/70  11/15/18 (!) 160/80  08/26/18 110/70   Wt Readings from Last 3 Encounters:  03/03/19 246 lb (111.6 kg)  11/15/18 240 lb 9.6 oz (109.1 kg)  08/26/18 228 lb (103.4 kg)    Health Maintenance  Topic Date Due  . HEMOGLOBIN A1C  02/10/2019  . MAMMOGRAM  03/30/2019  . OPHTHALMOLOGY EXAM  07/21/2019  . DEXA SCAN  07/30/2019  . FOOT EXAM  11/15/2019  . COLONOSCOPY  04/30/2022  . INFLUENZA VACCINE  Completed  . Hepatitis C Screening  Completed  . PNA vac Low Risk Adult  Completed  . TETANUS/TDAP  Discontinued    Assessment  1. Major depression, recurrent, chronic (HCC)   2. Spinal stenosis of lumbar region, unspecified whether neurogenic claudication present   3. Chronic obstructive pulmonary disease, unspecified COPD type ([place])   4. Hypertension associated with diabetes ([place])   5. Coronary artery disease involving native coronary artery of native heart with angina pectoris (HCC) Chronic  6. Hyperparathyroidism, secondary ([place])   7. Diet-controlled diabetes mellitus ([place])      Plan   Recurrent  major depression: Counseling done.  Probably due to coronavirus pandemic restrictions.  Will restart Zoloft.  Close follow-up.  Recheck in office in 6 weeks  Hypertension with borderline control.  Cardiology is adjusting medications.  Will recheck in 6 weeks.  Diabetes and hyperlipidemia currently controlled.  Spinal stenosis and chronic back pain: Active but stable. I discussed the assessment and treatment plan with the patient. The patient was provided an opportunity to ask questions and all were answered. The patient agreed with the plan and demonstrated an understanding of the instructions.   The patient was advised to call back or seek an in-person evaluation if the symptoms worsen or if the condition fails to improve as anticipated. Follow up: Return in about 6 weeks (around 04/14/2019) for follow up of diabetes and hypertension, mood follow up.  Visit date not found  Meds ordered this encounter  Medications  .  Cholecalciferol (VITAMIN D) 50 MCG (2000 UT) tablet    Sig: Take 1 tablet (2,000 Units total) by mouth daily.    Dispense:  90 tablet    Refill:  3  . valsartan (DIOVAN) 320 MG tablet    Sig: Take 1 tablet (320 mg total) by mouth daily.    Dispense:  90 tablet    Refill:  1  . sertraline (ZOLOFT) 25 MG tablet    Sig: Take 1 tablet (25 mg total) by mouth daily for 14 days, THEN 2 tablets (50 mg total) daily for 14 days.    Dispense:  60 tablet    Refill:  1      I reviewed the patients updated PMH, FH, and SocHx.    Patient Active Problem List   Diagnosis Date Noted  . Lumbar spinal stenosis 03/03/2019    Priority: High  . Coronary artery disease involving native coronary artery of native heart with angina pectoris ([place]) 03/10/2016    Priority: High  . History of ST elevation myocardial infarction (STEMI) 10/01/2015    Priority: High  . Type 2 diabetes mellitus with other specified complication ([place]) 123456    Priority: High  . Osteoporosis, Dx in 2018, Rx  Prolia 12/14/2006    Priority: High  . Hyperlipidemia associated with type 2 diabetes mellitus ([place]), LDL goal < 70, Rx Lipitor 09/22/2006    Priority: High  . Hypertension associated with diabetes ([place]) 09/22/2006    Priority: High  . Adenomatous polyp of colon 02/04/2004    Priority: High  . COPD (chronic obstructive pulmonary disease) ([place]) 04/19/2016    Priority: Medium  . GERD (gastroesophageal reflux disease), Rx Protonix 02/14/2014    Priority: Medium  . Status post gastric bypass for obesity 02/14/2014    Priority: Medium  . Anemia, B12 deficiency 04/04/2009    Priority: Medium  . Osteoarthritis, multiple sites 09/22/2006    Priority: Medium  . Degenerative joint disease (DJD) of lumbar spine 09/22/2006    Priority: Medium  . Bilateral lower extremity edema 11/21/2018    Priority: Low  . PVC's (premature ventricular contractions) 04/16/2017    Priority: Low  . Diet-controlled diabetes mellitus ([place]) 03/04/2019  . Major depression, recurrent, chronic ([place]) 03/04/2019  . Class 3 obesity with alveolar hypoventilation, serious comorbidity, and body mass index (BMI) of 45.0 to 49.9 in adult ([place]) 11/21/2018  . Hyperparathyroidism, secondary ([place]) 07/06/2016  . History of cardiac arrest 10/01/2015  . Former smoker, 50 pack years, quit 2010 02/14/2014   Current Meds  Medication Sig  . albuterol (PROVENTIL HFA;VENTOLIN HFA) 108 (90 Base) MCG/ACT inhaler Inhale 2 puffs into the lungs every 6 (six) hours as needed for wheezing or shortness of breath.  Marland Kitchen amLODipine (NORVASC) 5 MG tablet Take 1 tablet (5 mg total) by mouth daily. Please make yearly appt with Dr. Burt Knack for April for future refills. 1st attempt  . aspirin EC 81 MG tablet Take 81 mg by mouth daily.  Marland Kitchen atorvastatin (LIPITOR) 80 MG tablet Take 1 tablet by mouth daily.  . baclofen (LIORESAL) 10 MG tablet Take 1 tablet by mouth twice daily.  . carvedilol (COREG) 3.125 MG tablet Take 1 tablet by mouth twice daily. Take in  addition with carvedilol 6.25mg  for total dose of 9.375mg . please make yearly appt with Dr. Burt Knack for April for future refills. 1st attempt  . carvedilol (COREG) 6.25 MG tablet Take 1 tablet by mouth twice daily. Take in addition with carvedilol 3.125mg  for total dose of  9.375mg . please make yearly appt with Dr. Burt Knack for April for future refills. 1st attempt  . Cholecalciferol (VITAMIN D) 50 MCG (2000 UT) tablet Take 1 tablet (2,000 Units total) by mouth daily.  . clopidogrel (PLAVIX) 75 MG tablet Take 1 tablet (75 mg total) by mouth daily.  Marland Kitchen denosumab (PROLIA) 60 MG/ML SOSY injection Inject 60 mg into the skin every 6 (six) months.  . Ferrous Sulfate (IRON) 325 (65 Fe) MG TABS 1 tab every other day  . gabapentin (NEURONTIN) 100 MG capsule Take 1 capsule by mouth three times daily.  Marland Kitchen glucose blood (FREESTYLE LITE) test strip TEST UP TO FOUR TIMES DAILY Dx: E11.22  . Lancets (FREESTYLE) lancets TEST UP TO FOUR TIMES DAILY Dx: E11.22  . Multiple Vitamins-Minerals (BARIATRIC MULTIVITAMINS/IRON) CAPS Take 1 tablet by mouth daily.  . nitroGLYCERIN (NITROLINGUAL) 0.4 MG/SPRAY spray Place 1 spray under the tongue every 5 (five) minutes x 3 doses as needed for chest pain.  . pantoprazole (PROTONIX) 40 MG tablet Take 1 tablet by mouth twice daily before meals. **Needs appointment for further refills.**  . valsartan (DIOVAN) 320 MG tablet Take 1 tablet (320 mg total) by mouth daily.  . [DISCONTINUED] Cholecalciferol (VITAMIN D) 50 MCG (2000 UT) tablet Take 1 tablet (2,000 Units total) by mouth daily.  . [DISCONTINUED] valsartan (DIOVAN) 320 MG tablet Take 0.5 tablets (160 mg total) by mouth daily.    Allergies: Patient is allergic to sulfa antibiotics. Family History: Patient family history includes Colitis in her mother; Heart disease in her father; Lung cancer in her maternal uncle and maternal uncle; Stomach cancer in her maternal aunt. Social History:  Patient  reports that she quit smoking  about 6 years ago. Her smoking use included cigarettes. She has a 82.00 pack-year smoking history. She has never used smokeless tobacco. She reports that she does not drink alcohol or use drugs.  Review of Systems: Constitutional: Negative for fever malaise or anorexia Cardiovascular: negative for chest pain Respiratory: negative for SOB or persistent cough Gastrointestinal: negative for abdominal pain  OBJECTIVE Vitals: BP (!) 152/70 Comment: pt reported  Ht 5' 1.3" (1.557 m)   Wt 246 lb (111.6 kg)   LMP  (LMP Unknown)   BMI 46.03 kg/m  General: no acute distress , A&Ox3  Leamon Arnt, MD

## 2019-03-03 NOTE — Patient Instructions (Signed)
Please return in 6 weeks for mood follow up, htn and diabetes.   If you have any questions or concerns, please don't hesitate to send me a message via MyChart or call the office at [phone]. Thank you for visiting with [place] today! It's our pleasure caring for you.

## 2019-03-04 DIAGNOSIS — F339 Major depressive disorder, recurrent, unspecified: Secondary | ICD-10-CM | POA: Insufficient documentation

## 2019-03-04 DIAGNOSIS — E782 Mixed hyperlipidemia: Secondary | ICD-10-CM | POA: Insufficient documentation

## 2019-03-04 DIAGNOSIS — E119 Type 2 diabetes mellitus without complications: Secondary | ICD-10-CM | POA: Insufficient documentation

## 2019-03-07 ENCOUNTER — Telehealth: Payer: Self-pay | Admitting: Family Medicine

## 2019-03-07 ENCOUNTER — Other Ambulatory Visit: Payer: Self-pay

## 2019-03-07 MED ORDER — SERTRALINE HCL 50 MG PO TABS: 50.0000 mg | ORAL_TABLET | Freq: Every day | ORAL | 3 refills | Status: DC

## 2019-03-07 NOTE — Telephone Encounter (Signed)
Called in to mail order  

## 2019-03-07 NOTE — Telephone Encounter (Signed)
MEDICATION:sertraline (ZOLOFT) 25 MG tablet   Comments: [facility] is asking for clarification for this medication instruction  **Let patient know to contact pharmacy at the end of the day to make sure medication is ready. **  ** Please notify patient to allow 48-72 hours to process**  **Encourage patient to contact the pharmacy for refills or they can request refills through [facility]**

## 2019-03-09 NOTE — Telephone Encounter (Signed)
[facility] to clarify dose that patient currently takes.

## 2019-03-09 NOTE — Telephone Encounter (Signed)
[facility] is calling needing clarification on the medication by today or they will have to mark invalid. They have received two prescriptions and they are just asking for clarification.

## 2019-03-11 ENCOUNTER — Ambulatory Visit: Payer: PPO | Admitting: Gastroenterology

## 2019-03-11 ENCOUNTER — Encounter: Payer: Self-pay | Admitting: Gastroenterology

## 2019-03-11 VITALS — BP 142/76 | HR 84 | Temp 98.0°F | Ht 61.0 in | Wt 249.2 lb

## 2019-03-11 DIAGNOSIS — Z86010 Personal history of colonic polyps: Secondary | ICD-10-CM

## 2019-03-11 DIAGNOSIS — K219 Gastro-esophageal reflux disease without esophagitis: Secondary | ICD-10-CM

## 2019-03-11 DIAGNOSIS — D509 Iron deficiency anemia, unspecified: Secondary | ICD-10-CM | POA: Diagnosis not present

## 2019-03-11 MED ORDER — PANTOPRAZOLE SODIUM 40 MG PO TBEC: DELAYED_RELEASE_TABLET | ORAL | 11 refills | Status: DC

## 2019-03-11 MED ORDER — FAMOTIDINE 40 MG PO TABS: 40.0000 mg | ORAL_TABLET | Freq: Every day | ORAL | 11 refills | Status: DC

## 2019-03-11 NOTE — Progress Notes (Signed)
    History of Present Illness: This is a 74 year old female with GERD with LPR with globus, hoarseness.  Her LPR symptoms have been under good control on pantoprazole twice daily.  She has had prescription for Significant difficulties with nocturnal reflux and regurgitation.  She states she is unable to eat after 6 PM without having difficulties around 3 AM.  She has made multiple adjustments to her diet and meal timing to avoid nighttime difficulties with only partial success.   Current Medications, Allergies, Past Medical History, Past Surgical History, Family History and Social History were reviewed in [employer] record.   Physical Exam: General: Well developed, well nourished, no acute distress Head: Normocephalic and atraumatic Eyes:  sclerae anicteric, EOMI Ears: Normal auditory acuity Mouth: No deformity or lesions Lungs: Clear throughout to auscultation Heart: Regular rate and rhythm; no murmurs, rubs or bruits Abdomen: Soft, non tender and non distended. No masses, hepatosplenomegaly or hernias noted. Normal Bowel sounds Rectal: Not done Musculoskeletal: Symmetrical with no gross deformities  Pulses:  Normal pulses noted Extremities: No clubbing, cyanosis, edema or deformities noted Neurological: Alert oriented x 4, grossly nonfocal Psychological:  Alert and cooperative. Normal mood and affect   Assessment and Recommendations:  1. GERD with LPR.  Significant, poorly controlled nocturnal reflux.  Elevate head of bed 4 inches.  Follow standard antireflux measures.  Continue pantoprazole 40 mg po bid. Begin famotidine 40 mg at at bedtime.  If her nocturnal reflux symptoms are not under mcuh better control in 6 weeks recommend proceeding with EGD for further evaluation.  REV in 1 year.   2. Personal history of adenomatous colon polyps. Surveillance colonoscopy due in March 2022.  3. Fe deficiency anemia likely from decreased absorption post gastric bypass.  Iron replacement and follow up per PCP.  BMI=47.    4. CAD with stent, S/P MI on Plavix.

## 2019-03-11 NOTE — Patient Instructions (Signed)
We have sent the following medications to your pharmacy for you to pick up at your convenience: pantoprazole 40 mg twice daily and famotidine 40 mg at bedtime.   In 6 weeks if your nighttime reflux symptoms are not better then call our office.  Normal BMI (Body Mass Index- based on height and weight) is between 23 and 30. Your BMI today is Body mass index is 47.1 kg/m. Marland Kitchen Please consider follow up  regarding your BMI with your Primary Care Provider.  Thank you for choosing me and [facility].  Pricilla Riffle. Dagoberto Ligas., MD., Marval Regal

## 2019-03-17 ENCOUNTER — Telehealth: Payer: Self-pay | Admitting: Cardiovascular Disease

## 2019-03-17 NOTE — Telephone Encounter (Signed)
We are recommending the COVID-19 vaccine to all of our patients. Cardiac medications (including blood thinners) should not deter anyone from being vaccinated and there is no need to hold any of those medications prior to vaccine administration.     Currently, there is a hotline to call (active 02/11/19) to schedule vaccination appointments as no walk-ins will be accepted.   Number: [phone].    If an appointment is not available please go to [email or web address] to sign up for notification when additional vaccine appointments are available.   If you have further questions or concerns about the vaccine process, please visit [email or web address] or contact your primary care physician.   

## 2019-04-15 ENCOUNTER — Ambulatory Visit (INDEPENDENT_AMBULATORY_CARE_PROVIDER_SITE_OTHER): Payer: PPO | Admitting: Family Medicine

## 2019-04-15 ENCOUNTER — Encounter: Payer: Self-pay | Admitting: Family Medicine

## 2019-04-15 ENCOUNTER — Other Ambulatory Visit: Payer: Self-pay

## 2019-04-15 VITALS — BP 156/78 | HR 58 | Temp 97.6°F | Ht 61.0 in | Wt 241.6 lb

## 2019-04-15 DIAGNOSIS — M48061 Spinal stenosis, lumbar region without neurogenic claudication: Secondary | ICD-10-CM

## 2019-04-15 DIAGNOSIS — M8949 Other hypertrophic osteoarthropathy, multiple sites: Secondary | ICD-10-CM

## 2019-04-15 DIAGNOSIS — Z1231 Encounter for screening mammogram for malignant neoplasm of breast: Secondary | ICD-10-CM | POA: Diagnosis not present

## 2019-04-15 DIAGNOSIS — I152 Hypertension secondary to endocrine disorders: Secondary | ICD-10-CM

## 2019-04-15 DIAGNOSIS — R600 Localized edema: Secondary | ICD-10-CM

## 2019-04-15 DIAGNOSIS — M159 Polyosteoarthritis, unspecified: Secondary | ICD-10-CM

## 2019-04-15 DIAGNOSIS — D508 Other iron deficiency anemias: Secondary | ICD-10-CM

## 2019-04-15 DIAGNOSIS — F339 Major depressive disorder, recurrent, unspecified: Secondary | ICD-10-CM | POA: Diagnosis not present

## 2019-04-15 DIAGNOSIS — E1169 Type 2 diabetes mellitus with other specified complication: Secondary | ICD-10-CM

## 2019-04-15 DIAGNOSIS — Z9884 Bariatric surgery status: Secondary | ICD-10-CM | POA: Diagnosis not present

## 2019-04-15 DIAGNOSIS — E1159 Type 2 diabetes mellitus with other circulatory complications: Secondary | ICD-10-CM

## 2019-04-15 DIAGNOSIS — E785 Hyperlipidemia, unspecified: Secondary | ICD-10-CM

## 2019-04-15 DIAGNOSIS — I25119 Atherosclerotic heart disease of native coronary artery with unspecified angina pectoris: Secondary | ICD-10-CM

## 2019-04-15 DIAGNOSIS — I10 Essential (primary) hypertension: Secondary | ICD-10-CM

## 2019-04-15 DIAGNOSIS — E119 Type 2 diabetes mellitus without complications: Secondary | ICD-10-CM

## 2019-04-15 LAB — POCT GLYCOSYLATED HEMOGLOBIN (HGB A1C): Hemoglobin A1C: 6.3 % — AB (ref 4.0–5.6)

## 2019-04-15 MED ORDER — HYDROCODONE-ACETAMINOPHEN 10-325 MG PO TABS: 1.0000 | ORAL_TABLET | Freq: Three times a day (TID) | ORAL | 0 refills | Status: DC | PRN

## 2019-04-15 MED ORDER — POTASSIUM CHLORIDE ER 10 MEQ PO TBCR: 10.0000 meq | EXTENDED_RELEASE_TABLET | ORAL | Status: DC | PRN

## 2019-04-15 MED ORDER — FUROSEMIDE 20 MG PO TABS: 20.0000 mg | ORAL_TABLET | ORAL | Status: DC | PRN

## 2019-04-15 NOTE — Patient Instructions (Addendum)
Please return in July for your annual complete physical; please come fasting. And recheck diabetes, HTN and lipids and mood.  Please schedule a visit with Dr. Burt Knack so he may adjust up your blood pressure medications.  I have refilled your pain medications to use as needed.  We will call you for your Prolia injection appointment.   If you have any questions or concerns, please don't hesitate to send me a message via MyChart or call the office at [phone]. Thank you for visiting with [place] today! It's our pleasure caring for you.  I have ordered a mammogram and/or bone density for you as we discussed today: [x]   Mammogram  Due now. []   Bone Density     Please call the office checked below to schedule your appointment: Your appointment will at the following location  [x]   [facility]      [place], [place]         []   [facility]  [place], [place]  Make sure to wear two peace clothing  No lotions powders or deodorants the day of the appointment Make sure to bring picture ID and insurance card.  Bring list of medications you are currently taking including any supplements.

## 2019-04-15 NOTE — Progress Notes (Signed)
Subjective  CC:  Chief Complaint  Patient presents with  . Diabetes    checks dm twice a day  . Hypertension    checks bp readings at home. pt report higher readings lately  . Hyperlipidemia    atorvastatin daily. no side effects  . Depression    much better after covid vaccine. Takes zoloft    HPI: Tricia [name] is a 74 y.o. female who presents to the office today for follow up of diabetes, hypertension and problems listed above in the chief complaint.   Diabetic f/u: Her diabetic control is reported as Unchanged. She is diet controlled.  She denies exertional CP or SOB or symptomatic hypoglycemia. She denies foot sores.    Hypertension f/u: Control is worsening. Pt reports she is doing well. taking medications as instructed, no medication side effects noted, no TIAs, no chest pain on exertion, no dyspnea on exertion, no swelling of ankles. She is due a f/u with cardiology who is managing her HTN. She reports variable readings w/o sxs.  She denies adverse effects from his BP medications. Compliance with medication is good.    Hyperlipidemia f/u: Patient presents for follow up of lipids. Lipids have been well controlled on meds w/o myalgias or side effects.   GERD and iron deficiency anemia: reviewed recent GI notes  Depression: says "resolved" since getting her covid vaccies. On zoloft x 6 weeks:not sure if helping or not but not harming.   CAD w/o cp  Severe spinal stenosis on norco prn per Dr. Juleen China. I reviewed patient's records from the PMP aware controlled substance registry today. Active recently. No leg weakness or bowel/bladder dysfunction.  Due for mammogram.   Assessment  1. Diet-controlled diabetes mellitus ([place])   2. Coronary artery disease involving native coronary artery of native heart with angina pectoris ([place])   3. Hyperlipidemia associated with type 2 diabetes mellitus ([place]), LDL goal < 70, Rx Lipitor   4. Hypertension associated with diabetes ([place])     5. Spinal stenosis of lumbar region without neurogenic claudication   6. Major depression, recurrent, chronic ([place])   7. Lower extremity edema   8. Encounter for screening mammogram for breast cancer   9. Other iron deficiency anemia   10. Status post gastric bypass for obesity   11. Primary osteoarthritis involving multiple joints      Plan   Diabetes is currently very well controlled. Diet controlled  Hypertension is currently marginally controlled. Will see cards for further medication adjustment  Hyperlipidemia f/u:  At goal  Back pain due to spinal stenosis: norco refilled #30. Last filled in July. Appropriate use.   Edema: rare lasix use needed.   On iron for anemia.   Gerd: per gi.   Depression: improved! Will complete a 6 month  Course of zoloft and then reassess weaning. Pt agrees.  Diabetic education: ongoing education regarding chronic disease management for diabetes was given today. We continue to reinforce the ABC's of diabetic management: A1c (<7 or 8 dependent upon patient), tight blood pressure control, and cholesterol management with goal LDL < 100 minimally. We discuss diet strategies, exercise recommendations, medication options and possible side effects. At each visit, we review recommended immunizations and preventive care recommendations for diabetics and stress that good diabetic control can prevent other problems. See below for this patient's data. Hypertension education: ongoing education regarding management of these chronic disease states was given. Management strategies discussed on successive visits include dietary and exercise recommendations, goals of achieving  and maintaining IBW, and lifestyle modifications aiming for adequate sleep and minimizing stressors.   Follow up: Return in about 4 months (around 08/15/2019) for complete physical, follow up of diabetes and hypertension, follow up hypercholesterolemia..  Orders Placed This Encounter  Procedures   . MM 3D SCREEN BREAST BILATERAL  . POCT glycosylated hemoglobin (Hb A1C)   Meds ordered this encounter  Medications  . potassium chloride (KLOR-CON) 10 MEQ tablet    Sig: Take 1 tablet (10 mEq total) by mouth as needed.  . furosemide (LASIX) 20 MG tablet    Sig: Take 1-2 tablets (20-40 mg total) by mouth as needed.  Marland Kitchen DISCONTD: HYDROcodone-acetaminophen (NORCO) 10-325 MG tablet    Sig: Take 1 tablet by mouth every 8 (eight) hours as needed.    Dispense:  30 tablet    Refill:  0  . HYDROcodone-acetaminophen (NORCO) 10-325 MG tablet    Sig: Take 1 tablet by mouth every 8 (eight) hours as needed.    Dispense:  30 tablet    Refill:  0      I reviewed the patients updated PMH, FH, and SocHx.  Patient Active Problem List   Diagnosis Date Noted  . Diet-controlled diabetes mellitus ([place]) 03/04/2019    Priority: High  . Major depression, recurrent, chronic ([place]) 03/04/2019    Priority: High  . Mixed hyperlipidemia 03/04/2019    Priority: High  . Lumbar spinal stenosis 03/03/2019    Priority: High  . Coronary artery disease involving native coronary artery of native heart with angina pectoris ([place]) 03/10/2016    Priority: High  . History of ST elevation myocardial infarction (STEMI) 10/01/2015    Priority: High  . Type 2 diabetes mellitus with other specified complication ([place]) [id number]    Priority: High  . Osteoporosis, Dx in 2018, Rx Prolia 12/14/2006    Priority: High  . Hyperlipidemia associated with type 2 diabetes mellitus ([place]), LDL goal < 70, Rx Lipitor 09/22/2006    Priority: High  . Hypertension associated with diabetes ([place]) 09/22/2006    Priority: High  . Adenomatous polyp of colon 02/04/2004    Priority: High  . Hyperparathyroidism, secondary ([place]) 07/06/2016    Priority: Medium  . COPD (chronic obstructive pulmonary disease) ([place]) 04/19/2016    Priority: Medium  . GERD (gastroesophageal reflux disease), Rx Protonix 02/14/2014    Priority: Medium  . Status  post gastric bypass for obesity 02/14/2014    Priority: Medium  . Anemia, B12 deficiency 04/04/2009    Priority: Medium  . Osteoarthritis, multiple sites 09/22/2006    Priority: Medium  . Degenerative joint disease (DJD) of lumbar spine 09/22/2006    Priority: Medium  . Bilateral lower extremity edema 11/21/2018    Priority: Low  . PVC's (premature ventricular contractions) 04/16/2017    Priority: Low  . Class 3 obesity with alveolar hypoventilation, serious comorbidity, and body mass index (BMI) of 45.0 to 49.9 in adult ([place]) 11/21/2018  . History of cardiac arrest 10/01/2015  . Former smoker, 50 pack years, quit 2010 02/14/2014   Immunization History  Administered Date(s) Administered  . Fluad Quad(high Dose 65+) 10/07/2018  . Influenza Split 01/21/2011  . Influenza Whole 11/19/2007  . Influenza, High Dose Seasonal PF 10/16/2015, 11/12/2016, 11/07/2017  . Influenza-Unspecified 12/18/2013  . PFIZER SARS-COV-2 Vaccination 03/17/2019, 04/11/2019  . Pneumococcal Conjugate-13 08/26/2013  . Pneumococcal Polysaccharide-23 02/04/2003, 06/08/2015  . Td 02/03/2005  . Zoster Recombinat (Shingrix) 02/18/2017, 07/19/2017   Health Maintenance  Topic Date Due  . MAMMOGRAM  03/30/2019  . OPHTHALMOLOGY EXAM  07/21/2019  . DEXA SCAN  07/30/2019  . HEMOGLOBIN A1C  10/16/2019  . FOOT EXAM  11/15/2019  . COLONOSCOPY  04/30/2022  . INFLUENZA VACCINE  Completed  . Hepatitis C Screening  Completed  . PNA vac Low Risk Adult  Completed  . TETANUS/TDAP  Discontinued   Diabetes and HTN Related Lab Review: Lab Results  Component Value Date   HGBA1C 6.3 (A) 04/15/2019   HGBA1C 6.6 (H) 08/10/2018   HGBA1C 6.3 07/03/2017    Lab Results  Component Value Date   MICROALBUR 4.2 (H) 08/19/2013   Lab Results  Component Value Date   CREATININE 1.03 08/26/2018   BUN 39 (H) 08/26/2018   NA 139 08/26/2018   K 4.9 08/26/2018   CL 106 08/26/2018   CO2 25 08/26/2018   Lab Results  Component Value  Date   CHOL 143 08/10/2018   CHOL 162 07/03/2017   CHOL 122 06/18/2016   Lab Results  Component Value Date   HDL 41.70 08/10/2018   HDL 48.60 07/03/2017   HDL 51.20 06/18/2016   Lab Results  Component Value Date   LDLCALC 71 08/10/2018   LDLCALC 91 07/03/2017   LDLCALC 54 06/18/2016   Lab Results  Component Value Date   TRIG 155.0 (H) 08/10/2018   TRIG 115.0 07/03/2017   TRIG 82.0 06/18/2016   Lab Results  Component Value Date   CHOLHDL 3 08/10/2018   CHOLHDL 3 07/03/2017   CHOLHDL 2 06/18/2016   Lab Results  Component Value Date   LDLDIRECT 86.0 09/11/2015   LDLDIRECT 160.7 09/20/2012   LDLDIRECT 180.3 05/16/2011   The ASCVD Risk score (Goff DC Jr., et al., 2013) failed to calculate for the following reasons:   The patient has a prior MI or stroke diagnosis  BP Readings from Last 3 Encounters:  04/15/19 (!) 156/78  03/11/19 (!) 142/76  03/03/19 (!) 152/70   Wt Readings from Last 3 Encounters:  04/15/19 241 lb 9.6 oz (109.6 kg)  03/11/19 249 lb 4 oz (113.1 kg)  03/03/19 246 lb (111.6 kg)    Allergies: Patient is allergic to sulfa antibiotics. Family History: Patient family history includes Colitis in her mother; Heart disease in her father; Lung cancer in her maternal uncle and maternal uncle; Stomach cancer in her maternal aunt. Social History:  Patient  reports that she quit smoking about 7 years ago. Her smoking use included cigarettes. She has a 82.00 pack-year smoking history. She has never used smokeless tobacco. She reports that she does not drink alcohol or use drugs.  Review of Systems: Ophthalmic: negative for eye pain, loss of vision or double vision Cardiovascular: negative for chest pain Respiratory: negative for SOB or persistent cough MSK: negative for foot lesions Neurologic: negative for weakness or gait disturbance Current Meds  Medication Sig  . albuterol (PROVENTIL HFA;VENTOLIN HFA) 108 (90 Base) MCG/ACT inhaler Inhale 2 puffs into  the lungs every 6 (six) hours as needed for wheezing or shortness of breath.  Marland Kitchen amLODipine (NORVASC) 5 MG tablet Take 1 tablet (5 mg total) by mouth daily. Please make yearly appt with Dr. Burt Knack for April for future refills. 1st attempt  . aspirin EC 81 MG tablet Take 81 mg by mouth daily.  Marland Kitchen atorvastatin (LIPITOR) 80 MG tablet Take 1 tablet by mouth daily.  . baclofen (LIORESAL) 10 MG tablet Take 1 tablet by mouth twice daily.  . carvedilol (COREG) 3.125 MG tablet Take 1 tablet by mouth  twice daily. Take in addition with carvedilol 6.25mg  for total dose of 9.375mg . please make yearly appt with Dr. Burt Knack for April for future refills. 1st attempt  . carvedilol (COREG) 6.25 MG tablet Take 1 tablet by mouth twice daily. Take in addition with carvedilol 3.125mg  for total dose of 9.375mg . please make yearly appt with Dr. Burt Knack for April for future refills. 1st attempt  . Cholecalciferol (VITAMIN D) 50 MCG (2000 UT) tablet Take 1 tablet (2,000 Units total) by mouth daily.  . clopidogrel (PLAVIX) 75 MG tablet Take 1 tablet (75 mg total) by mouth daily.  . Cyanocobalamin (B-12) 1000 MCG SUBL Place 1 tablet under the tongue daily.  Marland Kitchen denosumab (PROLIA) 60 MG/ML SOSY injection Inject 60 mg into the skin every 6 (six) months.  . famotidine (PEPCID) 40 MG tablet Take 1 tablet (40 mg total) by mouth at bedtime.  . Ferrous Sulfate (IRON) 325 (65 Fe) MG TABS 1 tab every other day  . gabapentin (NEURONTIN) 100 MG capsule Take 1 capsule by mouth three times daily.  Marland Kitchen glucose blood (FREESTYLE LITE) test strip TEST UP TO FOUR TIMES DAILY Dx: E11.22  . Lancets (FREESTYLE) lancets TEST UP TO FOUR TIMES DAILY Dx: E11.22  . Multiple Vitamins-Minerals (BARIATRIC MULTIVITAMINS/IRON) CAPS Take 1 tablet by mouth daily.  . nitroGLYCERIN (NITROLINGUAL) 0.4 MG/SPRAY spray Place 1 spray under the tongue every 5 (five) minutes x 3 doses as needed for chest pain.  . pantoprazole (PROTONIX) 40 MG tablet Take 1 tablet by mouth  twice daily before meals.  . potassium chloride (KLOR-CON) 10 MEQ tablet Take 1 tablet (10 mEq total) by mouth as needed.  . sertraline (ZOLOFT) 50 MG tablet Take 1 tablet (50 mg total) by mouth at bedtime.  . valsartan (DIOVAN) 320 MG tablet Take 1 tablet (320 mg total) by mouth daily.  . [DISCONTINUED] potassium chloride (K-DUR) 10 MEQ tablet Take 1 tablet (10 mEq total) by mouth 2 (two) times daily. (Patient taking differently: Take 10 mEq by mouth as needed. )    Objective  Vitals: BP (!) 156/78 (BP Location: Left Arm, Patient Position: Sitting, Cuff Size: Large)   Pulse (!) 58   Temp 97.6 F (36.4 C) (Temporal)   Ht 5\' 1"  (1.549 m)   Wt 241 lb 9.6 oz (109.6 kg)   LMP  (LMP Unknown)   SpO2 94%   BMI 45.65 kg/m  General: well appearing, no acute distress  Psych:  Alert and oriented, normal mood and affect HEENT:  Normocephalic, atraumatic, moist mucous membranes, supple neck  Cardiovascular:  RRR, trace edmea Respiratory:  Good breath sounds bilaterally, CTAB with normal effort, no rales Skin:  Warm, no rashes Neurologic:   Mental status is normal. normal gait    Commons side effects, risks, benefits, and alternatives for medications and treatment plan prescribed today were discussed, and the patient expressed understanding of the given instructions. Patient is instructed to call or message via MyChart if he/she has any questions or concerns regarding our treatment plan. No barriers to understanding were identified. We discussed Red Flag symptoms and signs in detail. Patient expressed understanding regarding what to do in case of urgent or emergency type symptoms.   Medication list was reconciled, printed and provided to the patient in AVS. Patient instructions and summary information was reviewed with the patient as documented in the AVS. This note was prepared with assistance of Dragon voice recognition software. Occasional wrong-word or sound-a-like substitutions may have  occurred due to the inherent limitations  of voice recognition software

## 2019-04-19 ENCOUNTER — Ambulatory Visit (HOSPITAL_BASED_OUTPATIENT_CLINIC_OR_DEPARTMENT_OTHER): Payer: PPO

## 2019-04-19 ENCOUNTER — Telehealth: Payer: Self-pay

## 2019-04-19 NOTE — Telephone Encounter (Signed)
Patient approved for 20% OOP cost with a 20% admin fee which will be around $240 total out of pock cost.   PA submitted via covermymeds for Prolia Key: [id number]  Patient states that this is too expensive for her to pay and said it was approved last time through a "foundation" so it was no cost for her to get prolia.

## 2019-04-20 ENCOUNTER — Other Ambulatory Visit: Payer: Self-pay

## 2019-04-20 ENCOUNTER — Ambulatory Visit (HOSPITAL_BASED_OUTPATIENT_CLINIC_OR_DEPARTMENT_OTHER)
Admission: RE | Admit: 2019-04-20 | Discharge: 2019-04-20 | Disposition: A | Discharge status: Home / Self Care | Payer: PPO | Source: Ambulatory Visit | Attending: Family Medicine | Admitting: Family Medicine

## 2019-04-20 ENCOUNTER — Ambulatory Visit (HOSPITAL_BASED_OUTPATIENT_CLINIC_OR_DEPARTMENT_OTHER)
Admission: RE | Admit: 2019-04-20 | Discharge: 2019-04-20 | Disposition: A | Discharge status: Home / Self Care | Payer: PPO | Source: Ambulatory Visit | Attending: Acute Care | Admitting: Acute Care

## 2019-04-20 DIAGNOSIS — Z1231 Encounter for screening mammogram for malignant neoplasm of breast: Secondary | ICD-10-CM | POA: Diagnosis not present

## 2019-04-20 DIAGNOSIS — Z122 Encounter for screening for malignant neoplasm of respiratory organs: Secondary | ICD-10-CM

## 2019-04-20 DIAGNOSIS — Z87891 Personal history of nicotine dependence: Secondary | ICD-10-CM | POA: Diagnosis not present

## 2019-04-21 ENCOUNTER — Other Ambulatory Visit: Payer: Self-pay | Admitting: *Deleted

## 2019-04-21 DIAGNOSIS — Z87891 Personal history of nicotine dependence: Secondary | ICD-10-CM

## 2019-04-21 NOTE — Progress Notes (Signed)
Pt set up for Prolia injections already. PA needed.

## 2019-04-21 NOTE — Progress Notes (Signed)
Please call patient and let them  know their  low dose Ct was read as a Lung RADS 2: nodules that are benign in appearance and behavior with a very low likelihood of becoming a clinically active cancer due to size or lack of growth. Recommendation per radiology is for a repeat LDCT in 12 months. .Please let them  know we will order and schedule their  annual screening scan for 04/2020. Please let them  know there was notation of CAD on their  scan.  Please remind the patient  that this is a non-gated exam therefore degree or severity of disease  cannot be determined. Please have them  follow up with their PCP regarding potential risk factor modification, dietary therapy or pharmacologic therapy if clinically indicated. Pt.  is  currently on statin therapy. Please place order for annual  screening scan for  04/2020 and fax results to PCP. Thanks so much.

## 2019-05-16 NOTE — Progress Notes (Signed)
Cardiology Office Note:    Date:  05/17/2019   ID:  [name], DOB 08/05/1945, MRN [id number]  PCP:  Leamon Arnt, MD  Cardiologist:  Sherren Mocha, MD  Electrophysiologist:  None   Referring MD: Leamon Arnt, MD   Chief Complaint:  Follow-up (CAD)    Patient Profile:    Tricia [name] is a 74 y.o. female with:   Coronary artery disease  S/p remote BMS to the LAD  S/p inferior STEMI 8/17; C/B VF arrest >> 6 CPR, defib  S/p DES to the RCA  S/p staged PCI with DES to the LAD 2/2 ISR  Intolerant of Ticagrelor 2/2 dyspnea  Myoview 2/18: Low risk  Echo 8/17: EF 60-65, mild-moderate TR, mild AI  Hypertension  Diabetes mellitus  Hyperlipidemia  COPD  Morbid obesity  PVCs  Holter 3/19: 5% PVC burden  Prior CV studies: AAA [place] 04/30/17 IMPRESSION: Negative for abdominal aortic aneurysm.  24 Hr Holter 04/08/17 The basic rhythm is normal sinus with an average heart rate of 69 bpm There are no bradyarrhythmias There are frequent PVC's approximately 5% of total beats There are rare supraventricular ectopics No atrial fibrillation or flutter  Nuclear stress test 03/25/16 EF 59, no ischemia or scar, normal study  LHC 10/03/15 LAD prox and mid stent 85% ISR, dist stent ok with 25% ISR LCx prox 40%, dist 60% RCA ostial stent ok, prox stent ok, mid 65% and 20% PCI Angiosculpt scoring balloon and 3 x 32 mm Synergy DES to prox and mid LAD  Echo 10/02/15 EF 60-65%, normal wall motion, mild AI, mild RAE, mild to moderate TR  LHC 10/01/15 RCA ostial 80% and proximal 100%, mid 65% LCx prox 40% LAD prox stent 40% ISR, 85% ISR, dist stent ok with 25% ISR EF 55-65% PCI: 2.75 x 32 mm Synergy DES to ost/prox RCA  History of Present Illness:    Tricia [name] was last seen in April 2020 via telemedicine.  She returns for follow-up.  She is here alone.  She has been doing well.  She has not had chest discomfort.  Her breathing has been stable.  She uses a  recumbent stepper at home every day for 30 minutes.  She occasionally gets more short of breath with associated lower extremity swelling.  She takes furosemide with improvement.  She has not had orthopnea, syncope.  She has received both doses of the COVID-19 vaccine.    Past Medical History:  Diagnosis Date  . Adenomatous polyp of colon 02/2004  . Anemia   . Anxiety   . Arthritis   . B12 deficiency   . CAD (coronary artery disease)    a. s/p remote BMS to LAD;  b. 09/2015 Inf STEMI/VF Arrest: LM nl, LAD 85p (staged PCI 2 days later w/ 3.0x32 Synergy DES), 40p/m, 25d, LCX 52m, RCA 80ost/100p (2.75x32 Synergy DES), 22m, EF 55-65%. // c. Myoview 2/18: EF 59, no ischemia or infarction; Normal study  . Dermatophytosis of groin and perianal area   . Diet Controlled Diabetes Mellitus   . Diverticulosis   . GERD (gastroesophageal reflux disease)   . H/O echocardiogram    a. 09/2015 Echo: EF 60-65%, no rwma, mild AI, mildly dil RA, mild to mod TR.  Marland Kitchen Hyperlipidemia   . Hypertensive heart disease   . Low back pain    l5 disc  . Lumbar spinal stenosis 03/03/2019  . Morbid obesity ([place])   . Osteoporosis   . PVC's (premature ventricular  contractions) 04/16/2017   Holter 3/19: NSR, average heart rate 69, frequent PVCs (5% total beats), rare supraventricular ectopics, no AF/flutter  . Sleep apnea 10/03/2009   Resolved after gastric bypass    . TOBACCO ABUSE 10/05/2009   Qualifier: Diagnosis of  By: Stanford Breed, MD, Kandyce Rud   . Ventricular fibrillation ([place]) 10/01/2015   a. In setting of inferior STEMI.    Current Medications: Current Meds  Medication Sig  . albuterol (PROVENTIL HFA;VENTOLIN HFA) 108 (90 Base) MCG/ACT inhaler Inhale 2 puffs into the lungs every 6 (six) hours as needed for wheezing or shortness of breath.  Marland Kitchen amLODipine (NORVASC) 5 MG tablet Take 1 tablet (5 mg total) by mouth daily. Please make yearly appt with Dr. Burt Knack for April for future refills. 1st attempt  . aspirin  EC 81 MG tablet Take 81 mg by mouth daily.  Marland Kitchen atorvastatin (LIPITOR) 80 MG tablet Take 1 tablet by mouth daily.  . baclofen (LIORESAL) 10 MG tablet Take 1 tablet by mouth twice daily.  . carvedilol (COREG) 3.125 MG tablet Take 1 tablet (3.125 mg total) by mouth 2 (two) times daily with a meal.  . carvedilol (COREG) 6.25 MG tablet Take 1 tablet (6.25 mg total) by mouth 2 (two) times daily with a meal.  . Cholecalciferol (VITAMIN D) 50 MCG (2000 UT) tablet Take 1 tablet (2,000 Units total) by mouth daily.  . clopidogrel (PLAVIX) 75 MG tablet Take 1 tablet (75 mg total) by mouth daily.  . Cyanocobalamin (B-12) 1000 MCG SUBL Place 1 tablet under the tongue daily.  . famotidine (PEPCID) 40 MG tablet Take 1 tablet (40 mg total) by mouth at bedtime.  . furosemide (LASIX) 20 MG tablet Take 1-2 tablets (20-40 mg total) by mouth as needed for fluid or edema.  . gabapentin (NEURONTIN) 100 MG capsule Take 1 capsule by mouth three times daily.  Marland Kitchen glucose blood (FREESTYLE LITE) test strip TEST UP TO FOUR TIMES DAILY Dx: E11.22  . HYDROcodone-acetaminophen (NORCO) 10-325 MG tablet Take 1 tablet by mouth every 8 (eight) hours as needed.  . Lancets (FREESTYLE) lancets TEST UP TO FOUR TIMES DAILY Dx: E11.22  . Multiple Vitamins-Minerals (BARIATRIC MULTIVITAMINS/IRON) CAPS Take 1 tablet by mouth daily.  . nitroGLYCERIN (NITROLINGUAL) 0.4 MG/SPRAY spray Place 1 spray under the tongue every 5 (five) minutes x 3 doses as needed for chest pain.  . pantoprazole (PROTONIX) 40 MG tablet Take 1 tablet by mouth twice daily before meals.  . potassium chloride (KLOR-CON) 10 MEQ tablet Take 1 tablet (10 mEq total) by mouth as needed.  . sertraline (ZOLOFT) 50 MG tablet Take 1 tablet (50 mg total) by mouth at bedtime.  . valsartan (DIOVAN) 320 MG tablet Take 1 tablet (320 mg total) by mouth daily.  . [DISCONTINUED] carvedilol (COREG) 3.125 MG tablet Take 1 tablet by mouth twice daily. Take in addition with carvedilol 6.25mg  for  total dose of 9.375mg . please make yearly appt with Dr. Burt Knack for April for future refills. 1st attempt  . [DISCONTINUED] carvedilol (COREG) 6.25 MG tablet Take 1 tablet by mouth twice daily. Take in addition with carvedilol 3.125mg  for total dose of 9.375mg . please make yearly appt with Dr. Burt Knack for April for future refills. 1st attempt  . [DISCONTINUED] furosemide (LASIX) 20 MG tablet Take 1-2 tablets (20-40 mg total) by mouth as needed.  . [DISCONTINUED] potassium chloride (KLOR-CON) 10 MEQ tablet Take 1 tablet (10 mEq total) by mouth as needed.     Allergies:   Sulfa  antibiotics   Social History   Tobacco Use  . Smoking status: Former Smoker    Packs/day: 2.00    Years: 41.00    Pack years: 82.00    Types: Cigarettes    Quit date: 03/06/2012    Years since quitting: 7.2  . Smokeless tobacco: Never Used  Substance Use Topics  . Alcohol use: No    Alcohol/week: 0.0 standard drinks  . Drug use: No     Family Hx: The patient's family history includes Colitis in her mother; Heart disease in her father; Lung cancer in her maternal uncle and maternal uncle; Stomach cancer in her maternal aunt. There is no history of Colon cancer.  ROS   EKGs/Labs/Other Test Reviewed:    EKG:  EKG is  ordered today.  The ekg ordered today demonstrates normal sinus rhythm, heart rate 60, leftward axis, nonspecific ST-T wave changes, QTC 410, no change since prior tracing  Recent Labs: 07/27/2018: B Natriuretic Peptide 65.8 08/10/2018: Hemoglobin 11.0; Magnesium 2.1; Platelets 257.0 08/26/2018: ALT 8; BUN 39; Creatinine, Ser 1.03; Potassium 4.9; Sodium 139   Recent Lipid Panel Lab Results  Component Value Date/Time   CHOL 143 08/10/2018 10:38 AM   CHOL 125 03/05/2016 09:48 AM   TRIG 155.0 (H) 08/10/2018 10:38 AM   TRIG 122 12/11/2005 10:28 AM   HDL 41.70 08/10/2018 10:38 AM   HDL 45 03/05/2016 09:48 AM   CHOLHDL 3 08/10/2018 10:38 AM   LDLCALC 71 08/10/2018 10:38 AM   LDLCALC 63 03/05/2016  09:48 AM   LDLDIRECT 86.0 09/11/2015 11:04 AM    Physical Exam:    VS:  BP 120/60   Pulse 60   Ht 5\' 1"  (1.549 m)   Wt 241 lb 12.8 oz (109.7 kg)   LMP  (LMP Unknown)   SpO2 95%   BMI 45.69 kg/m     Wt Readings from Last 3 Encounters:  05/17/19 241 lb 12.8 oz (109.7 kg)  04/15/19 241 lb 9.6 oz (109.6 kg)  03/11/19 249 lb 4 oz (113.1 kg)     Constitutional:      Appearance: Healthy appearance. Not in distress.  Neck:     Thyroid: No thyromegaly.     Vascular: JVD normal.  Pulmonary:     Breath sounds: No wheezing. No rales.     Comments: Decreased breath sounds bilat, dry crackles that clear with cough Cardiovascular:     Normal rate. Regular rhythm. Normal S1. Normal S2.     Murmurs: There is no murmur.  Edema:    Pretibial: bilateral trace edema of the pretibial area.    Comments: R>L  Abdominal:     Palpations: Abdomen is soft. There is no hepatomegaly.  Skin:    General: Skin is warm and dry.  Neurological:     Mental Status: Alert and oriented to person, place and time.     Cranial Nerves: Cranial nerves are intact.      ASSESSMENT & PLAN:    1. Coronary artery disease involving native coronary artery of native heart without angina pectoris History of inferior ST elevation myocardial infarction in August 2017 treated with drug-eluting stent to the RCA and staged PCI of the LAD with a drug-eluting stent. Nuclear stress test in February 2018 was low risk without ischemia and normal EF.  She is doing well without anginal symptoms.  She uses a recumbent stepper every day for 30 minutes.  Continue amlodipine, aspirin, atorvastatin, carvedilol, clopidogrel, valsartan.  She notes that she may  need a colonoscopy/EGD in the near future.  She would be at acceptable risk and does not need further testing.  She remains on dual antiplatelet therapy given her history of multiple stents.  If the bleeding risk during her colonoscopy/EGD is too great, clopidogrel could be held for  several days (last PCI in 2017) and resumed as soon as possible post procedure. FU in 6 mos.   2. Essential hypertension The patient's blood pressure is controlled on her current regimen.  Continue current therapy.    3. Mixed hyperlipidemia LDL in July 2020.  Continue high intensity statin therapy.  She has a complete physical with primary care in July and will have her lipids checked again at that time.  4. Type 2 diabetes mellitus with other specified complication, without long-term current use of insulin (HCC) Hemoglobin A1c in March 2021 was 6.3.  She is currently managed with diet therapy alone.  If her sugars become more uncontrolled, empagliflozin could be considered given its CV benefits.  5. Lower extremity edema She takes as needed furosemide.  She describes increasing shortness of breath related to this.  I suspect that she may have an element of diastolic dysfunction contributing to some of her shortness of breath and edema.  I have advised her to weigh herself on a daily basis and to base her dose of furosemide on a weight gain of 3 pounds or more in 1 day.  Dispo:  Return in about 6 months (around 11/16/2019) for Routine Follow Up, w/ Dr. Burt Knack, in person.   Medication Adjustments/Labs and Tests Ordered: Current medicines are reviewed at length with the patient today.  Concerns regarding medicines are outlined above.  Tests Ordered: Orders Placed This Encounter  Procedures  . EKG 12-Lead   Medication Changes: Meds ordered this encounter  Medications  . carvedilol (COREG) 3.125 MG tablet    Sig: Take 1 tablet (3.125 mg total) by mouth 2 (two) times daily with a meal.    Dispense:  180 tablet    Refill:  3  . carvedilol (COREG) 6.25 MG tablet    Sig: Take 1 tablet (6.25 mg total) by mouth 2 (two) times daily with a meal.    Dispense:  180 tablet    Refill:  3  . furosemide (LASIX) 20 MG tablet    Sig: Take 1-2 tablets (20-40 mg total) by mouth as needed for fluid or  edema.    Dispense:  30 tablet    Refill:  3  . potassium chloride (KLOR-CON) 10 MEQ tablet    Sig: Take 1 tablet (10 mEq total) by mouth as needed.    Dispense:  30 tablet    Refill:  3    Signed, Richardson Dopp, PA-C  05/17/2019 9:34 AM    [address], [place], [place]  [address] Phone: [phone]; Fax: [phone]

## 2019-05-17 ENCOUNTER — Encounter: Payer: Self-pay | Admitting: Physician Assistant

## 2019-05-17 ENCOUNTER — Ambulatory Visit (INDEPENDENT_AMBULATORY_CARE_PROVIDER_SITE_OTHER): Payer: PPO | Admitting: Physician Assistant

## 2019-05-17 ENCOUNTER — Other Ambulatory Visit: Payer: Self-pay

## 2019-05-17 VITALS — BP 120/60 | HR 60 | Ht 61.0 in | Wt 241.8 lb

## 2019-05-17 DIAGNOSIS — I10 Essential (primary) hypertension: Secondary | ICD-10-CM | POA: Diagnosis not present

## 2019-05-17 DIAGNOSIS — E782 Mixed hyperlipidemia: Secondary | ICD-10-CM

## 2019-05-17 DIAGNOSIS — E1169 Type 2 diabetes mellitus with other specified complication: Secondary | ICD-10-CM

## 2019-05-17 DIAGNOSIS — I2510 Atherosclerotic heart disease of native coronary artery without angina pectoris: Secondary | ICD-10-CM

## 2019-05-17 DIAGNOSIS — R600 Localized edema: Secondary | ICD-10-CM

## 2019-05-17 MED ORDER — CARVEDILOL 6.25 MG PO TABS: 6.2500 mg | ORAL_TABLET | Freq: Two times a day (BID) | ORAL | 3 refills | Status: DC

## 2019-05-17 MED ORDER — CARVEDILOL 3.125 MG PO TABS: 3.1250 mg | ORAL_TABLET | Freq: Two times a day (BID) | ORAL | 3 refills | Status: DC

## 2019-05-17 MED ORDER — FUROSEMIDE 20 MG PO TABS: 20.0000 mg | ORAL_TABLET | ORAL | 3 refills | Status: DC | PRN

## 2019-05-17 MED ORDER — POTASSIUM CHLORIDE ER 10 MEQ PO TBCR: 10.0000 meq | EXTENDED_RELEASE_TABLET | ORAL | 3 refills | Status: DC | PRN

## 2019-05-17 NOTE — Patient Instructions (Signed)
Medication Instructions:  Your physician recommends that you continue on your current medications as directed. Please refer to the Current Medication list given to you today.  *If you need a refill on your cardiac medications before your next appointment, please call your pharmacy*  Lab Work: None ordered today  Testing/Procedures: None ordered today  Follow-Up: At [facility], you and your health needs are our priority.  As part of our continuing mission to provide you with exceptional heart care, we have created designated Provider Care Teams.  These Care Teams include your primary Cardiologist (physician) and Advanced Practice Providers (APPs -  Physician Assistants and Nurse Practitioners) who all work together to provide you with the care you need, when you need it.  We recommend signing up for the patient portal called "MyChart".  Sign up information is provided on this After Visit Summary.  MyChart is used to connect with patients for Virtual Visits (Telemedicine).  Patients are able to view lab/test results, encounter notes, upcoming appointments, etc.  Non-urgent messages can be sent to your provider as well.   To learn more about what you can do with MyChart, go to [email or web address].    Your next appointment:   6 month(s)  The format for your next appointment:   In Person  Provider:   Michael Cooper, MD    

## 2019-05-19 ENCOUNTER — Other Ambulatory Visit: Payer: Self-pay | Admitting: Family Medicine

## 2019-05-19 ENCOUNTER — Other Ambulatory Visit: Payer: Self-pay

## 2019-05-19 DIAGNOSIS — G8929 Other chronic pain: Secondary | ICD-10-CM

## 2019-05-19 DIAGNOSIS — M5441 Lumbago with sciatica, right side: Secondary | ICD-10-CM

## 2019-05-19 MED ORDER — AMLODIPINE BESYLATE 5 MG PO TABS: 5.0000 mg | ORAL_TABLET | Freq: Every day | ORAL | 3 refills | Status: DC

## 2019-08-15 ENCOUNTER — Other Ambulatory Visit: Payer: Self-pay | Admitting: Physician Assistant

## 2019-08-15 ENCOUNTER — Other Ambulatory Visit: Payer: Self-pay | Admitting: Family Medicine

## 2019-08-15 DIAGNOSIS — M5441 Lumbago with sciatica, right side: Secondary | ICD-10-CM

## 2019-08-15 DIAGNOSIS — G8929 Other chronic pain: Secondary | ICD-10-CM

## 2019-09-01 ENCOUNTER — Ambulatory Visit (INDEPENDENT_AMBULATORY_CARE_PROVIDER_SITE_OTHER): Payer: PPO

## 2019-09-01 DIAGNOSIS — Z0000 Encounter for general adult medical examination without abnormal findings: Secondary | ICD-10-CM | POA: Diagnosis not present

## 2019-09-01 DIAGNOSIS — M810 Age-related osteoporosis without current pathological fracture: Secondary | ICD-10-CM | POA: Diagnosis not present

## 2019-09-01 NOTE — Patient Instructions (Addendum)
Tricia [name] , Thank you for taking time to come for your Medicare Wellness Visit. I appreciate your ongoing commitment to your health goals. Please review the following plan we discussed and let me know if I can assist you in the future.   Screening recommendations/referrals: Colonoscopy: Done 04/29/17 Mammogram: Done 04/20/19 Bone Density: Done 07/29/17 Recommended yearly ophthalmology/optometry visit for glaucoma screening and checkup Recommended yearly dental visit for hygiene and checkup  Vaccinations: Influenza vaccine: Up to date Pneumococcal vaccine: Up to date Tdap vaccine: Due with information given Shingles vaccine: Completed 1/16 & 07/19/17   Covid-19:2/11 & 04/11/19  Advanced directives: Please bring a copy of your health care power of attorney and living will to the office at your convenience.  Conditions/risks identified: To lose weight  Next appointment: Follow up in one year for your annual wellness visit    Preventive Care 65 Years and Older, Female Preventive care refers to lifestyle choices and visits with your health care provider that can promote health and wellness. What does preventive care include?  A yearly physical exam. This is also called an annual well check.  Dental exams once or twice a year.  Routine eye exams. Ask your health care provider how often you should have your eyes checked.  Personal lifestyle choices, including:  Daily care of your teeth and gums.  Regular physical activity.  Eating a healthy diet.  Avoiding tobacco and drug use.  Limiting alcohol use.  Practicing safe sex.  Taking low-dose aspirin every day.  Taking vitamin and mineral supplements as recommended by your health care provider. What happens during an annual well check? The services and screenings done by your health care provider during your annual well check will depend on your age, overall health, lifestyle risk factors, and family history of disease. Counseling   Your health care provider may ask you questions about your:  Alcohol use.  Tobacco use.  Drug use.  Emotional well-being.  Home and relationship well-being.  Sexual activity.  Eating habits.  History of falls.  Memory and ability to understand (cognition).  Work and work Statistician.  Reproductive health. Screening  You may have the following tests or measurements:  Height, weight, and BMI.  Blood pressure.  Lipid and cholesterol levels. These may be checked every 5 years, or more frequently if you are over 72 years old.  Skin check.  Lung cancer screening. You may have this screening every year starting at age 72 if you have a 30-pack-year history of smoking and currently smoke or have quit within the past 15 years.  Fecal occult blood test (FOBT) of the stool. You may have this test every year starting at age 20.  Flexible sigmoidoscopy or colonoscopy. You may have a sigmoidoscopy every 5 years or a colonoscopy every 10 years starting at age 90.  Hepatitis C blood test.  Hepatitis B blood test.  Sexually transmitted disease (STD) testing.  Diabetes screening. This is done by checking your blood sugar (glucose) after you have not eaten for a while (fasting). You may have this done every 1-3 years.  Bone density scan. This is done to screen for osteoporosis. You may have this done starting at age 74.  Mammogram. This may be done every 1-2 years. Talk to your health care provider about how often you should have regular mammograms. Talk with your health care provider about your test results, treatment options, and if necessary, the need for more tests. Vaccines  Your health care provider may recommend  certain vaccines, such as:  Influenza vaccine. This is recommended every year.  Tetanus, diphtheria, and acellular pertussis (Tdap, Td) vaccine. You may need a Td booster every 10 years.  Zoster vaccine. You may need this after age 9.  Pneumococcal 13-valent  conjugate (PCV13) vaccine. One dose is recommended after age 81.  Pneumococcal polysaccharide (PPSV23) vaccine. One dose is recommended after age 45. Talk to your health care provider about which screenings and vaccines you need and how often you need them. This information is not intended to replace advice given to you by your health care provider. Make sure you discuss any questions you have with your health care provider. Document Released: 02/16/2015 Document Revised: 10/10/2015 Document Reviewed: 11/21/2014 Elsevier Interactive Patient Education  2017 [place] Prevention in the Home Falls can cause injuries. They can happen to people of all ages. There are many things you can do to make your home safe and to help prevent falls. What can I do on the outside of my home?  Regularly fix the edges of walkways and driveways and fix any cracks.  Remove anything that might make you trip as you walk through a door, such as a raised step or threshold.  Trim any bushes or trees on the path to your home.  Use bright outdoor lighting.  Clear any walking paths of anything that might make someone trip, such as rocks or tools.  Regularly check to see if handrails are loose or broken. Make sure that both sides of any steps have handrails.  Any raised decks and porches should have guardrails on the edges.  Have any leaves, snow, or ice cleared regularly.  Use sand or salt on walking paths during winter.  Clean up any spills in your garage right away. This includes oil or grease spills. What can I do in the bathroom?  Use night lights.  Install grab bars by the toilet and in the tub and shower. Do not use towel bars as grab bars.  Use non-skid mats or decals in the tub or shower.  If you need to sit down in the shower, use a plastic, non-slip stool.  Keep the floor dry. Clean up any water that spills on the floor as soon as it happens.  Remove soap buildup in the tub or  shower regularly.  Attach bath mats securely with double-sided non-slip rug tape.  Do not have throw rugs and other things on the floor that can make you trip. What can I do in the bedroom?  Use night lights.  Make sure that you have a light by your bed that is easy to reach.  Do not use any sheets or blankets that are too big for your bed. They should not hang down onto the floor.  Have a firm chair that has side arms. You can use this for support while you get dressed.  Do not have throw rugs and other things on the floor that can make you trip. What can I do in the kitchen?  Clean up any spills right away.  Avoid walking on wet floors.  Keep items that you use a lot in easy-to-reach places.  If you need to reach something above you, use a strong step stool that has a grab bar.  Keep electrical cords out of the way.  Do not use floor polish or wax that makes floors slippery. If you must use wax, use non-skid floor wax.  Do not have throw rugs and other  things on the floor that can make you trip. What can I do with my stairs?  Do not leave any items on the stairs.  Make sure that there are handrails on both sides of the stairs and use them. Fix handrails that are broken or loose. Make sure that handrails are as long as the stairways.  Check any carpeting to make sure that it is firmly attached to the stairs. Fix any carpet that is loose or worn.  Avoid having throw rugs at the top or bottom of the stairs. If you do have throw rugs, attach them to the floor with carpet tape.  Make sure that you have a light switch at the top of the stairs and the bottom of the stairs. If you do not have them, ask someone to add them for you. What else can I do to help prevent falls?  Wear shoes that:  Do not have high heels.  Have rubber bottoms.  Are comfortable and fit you well.  Are closed at the toe. Do not wear sandals.  If you use a stepladder:  Make sure that it is fully  opened. Do not climb a closed stepladder.  Make sure that both sides of the stepladder are locked into place.  Ask someone to hold it for you, if possible.  Clearly mark and make sure that you can see:  Any grab bars or handrails.  First and last steps.  Where the edge of each step is.  Use tools that help you move around (mobility aids) if they are needed. These include:  Canes.  Walkers.  Scooters.  Crutches.  Turn on the lights when you go into a dark area. Replace any light bulbs as soon as they burn out.  Set up your furniture so you have a clear path. Avoid moving your furniture around.  If any of your floors are uneven, fix them.  If there are any pets around you, be aware of where they are.  Review your medicines with your doctor. Some medicines can make you feel dizzy. This can increase your chance of falling. Ask your doctor what other things that you can do to help prevent falls. This information is not intended to replace advice given to you by your health care provider. Make sure you discuss any questions you have with your health care provider. Document Released: 11/16/2008 Document Revised: 06/28/2015 Document Reviewed: 02/24/2014 Elsevier Interactive Patient Education  2017 [employer].

## 2019-09-01 NOTE — Progress Notes (Signed)
Virtual Visit via Telephone Note  I connected with  Tricia [name] on 09/01/19 at  8:00 AM EDT by telephone and verified that I am speaking with the correct person using two identifiers.  Medicare Annual Wellness visit completed telephonically due to Covid-19 pandemic.   Persons participating in this call: This Health Coach and this patient.   Location: Patient: Home Provider: Office   I discussed the limitations, risks, security and privacy concerns of performing an evaluation and management service by telephone and the availability of in person appointments. The patient expressed understanding and agreed to proceed.  Unable to perform video visit due to video visit attempted and failed and/or patient does not have video capability.   Some vital signs may be absent or patient reported.   Willette Brace, LPN    Subjective:   Tricia [name] is a 74 y.o. female who presents for Medicare Annual (Subsequent) preventive examination.  Review of Systems     Cardiac Risk Factors include: advanced age (>68men, >54 women);diabetes mellitus;dyslipidemia;hypertension;obesity (BMI >30kg/m2)     Objective:    There were no vitals filed for this visit. There is no height or weight on file to calculate BMI.  Advanced Directives 09/01/2019 09/02/2018 07/27/2018 09/07/2017 07/17/2017 04/29/2017 10/15/2015  Does Patient Have a Medical Advance Directive? Yes Yes No No;Yes No No Yes  Type of Paramedic of [place];Living will [place];Living will - Living will - - Living will  Does patient want to make changes to medical advance directive? - Yes (MAU/Ambulatory/Procedural Areas - Information given) - No - Patient declined - - No - Patient declined  Copy of [place] in Chart? No - copy requested No - copy requested - - - - No - copy requested  Would patient like information on creating a medical advance directive? - - - No - Patient declined  - - -    Current Medications (verified) Outpatient Encounter Medications as of 09/01/2019  Medication Sig  . albuterol (PROVENTIL HFA;VENTOLIN HFA) 108 (90 Base) MCG/ACT inhaler Inhale 2 puffs into the lungs every 6 (six) hours as needed for wheezing or shortness of breath.  Marland Kitchen amLODipine (NORVASC) 5 MG tablet Take 1 tablet (5 mg total) by mouth daily.  Marland Kitchen aspirin EC 81 MG tablet Take 81 mg by mouth daily.  Marland Kitchen atorvastatin (LIPITOR) 80 MG tablet Take 1 tablet by mouth daily.  . baclofen (LIORESAL) 10 MG tablet Take 1 tablet by mouth twice daily.  . carvedilol (COREG) 3.125 MG tablet Take 1 tablet (3.125 mg total) by mouth 2 (two) times daily with a meal.  . carvedilol (COREG) 6.25 MG tablet Take 1 tablet (6.25 mg total) by mouth 2 (two) times daily with a meal.  . clopidogrel (PLAVIX) 75 MG tablet Take 1 tablet by mouth daily.  . famotidine (PEPCID) 40 MG tablet Take 1 tablet (40 mg total) by mouth at bedtime.  . furosemide (LASIX) 20 MG tablet Take 1-2 tablets (20-40 mg total) by mouth as needed for fluid or edema.  Marland Kitchen glucose blood (FREESTYLE LITE) test strip TEST UP TO FOUR TIMES DAILY Dx: E11.22  . HYDROcodone-acetaminophen (NORCO) 10-325 MG tablet Take 1 tablet by mouth every 8 (eight) hours as needed.  . Lancets (FREESTYLE) lancets TEST UP TO FOUR TIMES DAILY Dx: E11.22  . nitroGLYCERIN (NITROLINGUAL) 0.4 MG/SPRAY spray Place 1 spray under the tongue every 5 (five) minutes x 3 doses as needed for chest pain.  . pantoprazole (  PROTONIX) 40 MG tablet Take 1 tablet by mouth twice daily before meals.  . potassium chloride (KLOR-CON) 10 MEQ tablet Take 1 tablet (10 mEq total) by mouth as needed.  . sertraline (ZOLOFT) 50 MG tablet Take 1 tablet (50 mg total) by mouth at bedtime.  . valsartan (DIOVAN) 320 MG tablet Take 1 tablet (320 mg total) by mouth daily.  . Cholecalciferol (VITAMIN D) 50 MCG (2000 UT) tablet Take 1 tablet (2,000 Units total) by mouth daily. (Patient not taking: Reported on  09/01/2019)  . Cyanocobalamin (B-12) 1000 MCG SUBL Place 1 tablet under the tongue daily. (Patient not taking: Reported on 09/01/2019)  . Multiple Vitamins-Minerals (BARIATRIC MULTIVITAMINS/IRON) CAPS Take 1 tablet by mouth daily. (Patient not taking: Reported on 09/01/2019)  . [DISCONTINUED] gabapentin (NEURONTIN) 100 MG capsule Take 1 capsule by mouth three times daily.   No facility-administered encounter medications on file as of 09/01/2019.    Allergies (verified) Sulfa antibiotics   History: Past Medical History:  Diagnosis Date  . Adenomatous polyp of colon 02/2004  . Anemia   . Anxiety   . Arthritis   . B12 deficiency   . CAD (coronary artery disease)    a. s/p remote BMS to LAD;  b. 09/2015 Inf STEMI/VF Arrest: LM nl, LAD 85p (staged PCI 2 days later w/ 3.0x32 Synergy DES), 40p/m, 25d, LCX 71m, RCA 80ost/100p (2.75x32 Synergy DES), 74m, EF 55-65%. // c. Myoview 2/18: EF 59, no ischemia or infarction; Normal study  . Dermatophytosis of groin and perianal area   . Diet Controlled Diabetes Mellitus   . Diverticulosis   . GERD (gastroesophageal reflux disease)   . H/O echocardiogram    a. 09/2015 Echo: EF 60-65%, no rwma, mild AI, mildly dil RA, mild to mod TR.  Marland Kitchen Hyperlipidemia   . Hypertensive heart disease   . Low back pain    l5 disc  . Lumbar spinal stenosis 03/03/2019  . Morbid obesity ([place])   . Osteoporosis   . PVC's (premature ventricular contractions) 04/16/2017   Holter 3/19: NSR, average heart rate 69, frequent PVCs (5% total beats), rare supraventricular ectopics, no AF/flutter  . Sleep apnea 10/03/2009   Resolved after gastric bypass    . TOBACCO ABUSE 10/05/2009   Qualifier: Diagnosis of  By: Stanford Breed, MD, Kandyce Rud   . Ventricular fibrillation ([place]) 10/01/2015   a. In setting of inferior STEMI.   Past Surgical History:  Procedure Laterality Date  . ABDOMINAL HYSTERECTOMY    . APPENDECTOMY    . BARIATRIC SURGERY    . CARDIAC CATHETERIZATION N/A  10/01/2015   Procedure: Left Heart Cath and Coronary Angiography;  Surgeon: Leonie Man, MD;  Location: [facility];  Service: Cardiovascular;  Laterality: N/A;  . CARDIAC CATHETERIZATION N/A 10/01/2015   Procedure: Coronary Stent Intervention;  Surgeon: Leonie Man, MD;  Location: [facility];  Service: Cardiovascular;  Laterality: N/A;  . CARDIAC CATHETERIZATION N/A 10/03/2015   Procedure: Coronary Stent Intervention;  Surgeon: Troy Sine, MD;  Location: [facility];  Service: Cardiovascular;  Laterality: N/A;  . CARDIAC CATHETERIZATION N/A 10/03/2015   Procedure: Coronary/Graft Angiography;  Surgeon: Troy Sine, MD;  Location: [facility];  Service: Cardiovascular;  Laterality: N/A;  . CARPAL TUNNEL RELEASE Bilateral   . CERVICAL DISC SURGERY    . CHOLECYSTECTOMY    . CORONARY ANGIOPLASTY  2002  . HERNIA REPAIR    . LUMBAR LAMINECTOMY     L3-4  .  NEPHRECTOMY Right    Partial  . TONSILLECTOMY    . TOTAL KNEE ARTHROPLASTY Left   . TUBAL LIGATION    . ULNAR NERVE REPAIR Left    Family History  Problem Relation Age of Onset  . Colitis Mother        sepsis from c dif colitis  . Heart disease Father   . Lung cancer Maternal Uncle   . Stomach cancer Maternal Aunt   . Lung cancer Maternal Uncle   . Colon cancer Neg Hx    Social History   Socioeconomic History  . Marital status: Widowed    Spouse name: Not on file  . Number of children: Not on file  . Years of education: Not on file  . Highest education level: Not on file  Occupational History  . Occupation: retired    Fish farm manager: UNEMPLOYED  Tobacco Use  . Smoking status: Former Smoker    Packs/day: 2.00    Years: 41.00    Pack years: 82.00    Types: Cigarettes    Quit date: 03/06/2012    Years since quitting: 7.4  . Smokeless tobacco: Never Used  Vaping Use  . Vaping Use: Never used  Substance and Sexual Activity  . Alcohol use: No    Alcohol/week: 0.0 standard drinks  . Drug  use: No  . Sexual activity: Yes  Other Topics Concern  . Not on file  Social History Narrative   Widowed in 04/2012. 2 children. 3 grandkids. Lives alone. Completely indendent. Disabled/retired. Disabled-lifted computer paper. Hobbies: spend money-gamble.   Social Determinants of Health   Financial Resource Strain: Low Risk   . Difficulty of Paying Living Expenses: Not hard at all  Food Insecurity: No Food Insecurity  . Worried About Charity fundraiser in the Last Year: Never true  . Ran Out of Food in the Last Year: Never true  Transportation Needs: No Transportation Needs  . Lack of Transportation (Medical): No  . Lack of Transportation (Non-Medical): No  Physical Activity: Sufficiently Active  . Days of Exercise per Week: 5 days  . Minutes of Exercise per Session: 30 min  Stress: No Stress Concern Present  . Feeling of Stress : Not at all  Social Connections: Socially Isolated  . Frequency of Communication with Friends and Family: More than three times a week  . Frequency of Social Gatherings with Friends and Family: Twice a week  . Attends Religious Services: Never  . Active Member of Clubs or Organizations: No  . Attends Archivist Meetings: Never  . Marital Status: Widowed    Tobacco Counseling Counseling given: Not Answered   Clinical Intake:  Pre-visit preparation completed: Yes  Pain : No/denies pain     BMI - recorded: 45.71 Nutritional Status: BMI > 30  Obese Diabetes: Yes CBG done?: No Did pt. bring in CBG monitor from home?: No  How often do you need to have someone help you when you read instructions, pamphlets, or other written materials from your doctor or pharmacy?: 1 - Never  Diabetic?Yes  Interpreter Needed?: No  Information entered by :: Charlott Rakes, LPN   Activities of Daily Living In your present state of health, do you have any difficulty performing the following activities: 09/01/2019  Hearing? N  Vision? N  Difficulty  concentrating or making decisions? Y  Walking or climbing stairs? Y  Comment stairs at sons house and uses a prong cane to go up  Doing errands, shopping? N  Preparing  Food and eating ? N  Using the Toilet? N  In the past six months, have you accidently leaked urine? Y  Comment wears pads  Do you have problems with loss of bowel control? Y  Comment 1 accident couldn't make it on time  Managing your Medications? N  Managing your Finances? N  Housekeeping or managing your Housekeeping? N  Some recent data might be hidden    Patient Care Team: Leamon Arnt, MD as PCP - General (Family Medicine) Sherren Mocha, MD as PCP - Cardiology (Cardiology) Ladene Artist, MD as Consulting Physician (Gastroenterology) Sharmon Revere as Physician Assistant (Cardiology) Gardiner Barefoot, DPM as Consulting Physician (Podiatry) Sherren Mocha, MD as Consulting Physician (Cardiology) Jovita Gamma, MD as Consulting Physician (Neurosurgery)  Indicate any recent Medical Services you may have received from other than [facility] providers in the past year (date may be approximate).     Assessment:   This is a routine wellness examination for Tricia [name].  Hearing/Vision screen  Hearing Screening   125Hz  250Hz  500Hz  1000Hz  2000Hz  3000Hz  4000Hz  6000Hz  8000Hz   Right ear:           Left ear:           Comments: Pt denies any hearing difficulty  Vision Screening Comments: Pt follows up annually with [facility] apt 09/12/19  Dietary issues and exercise activities discussed: Current Exercise Habits: Home exercise routine, Type of exercise: Other - see comments (nu step machine), Time (Minutes): 30, Frequency (Times/Week): 5, Weekly Exercise (Minutes/Week): 150, Intensity: Mild  Goals    . Patient Stated     To lose weight     . Weight < 180 lb (81.647 kg)     Is currently trying to lose weight What is the "Mediterranean" diet?  There's no one "Mediterranean" diet. At least 16 countries  border the [employer]. Diets vary between these countries and also between regions within a country. Many differences in culture, ethnic background, religion, economy and agricultural production result in different diets. But the common Mediterranean dietary pattern has these characteristics: high consumption of fruits, vegetables, bread and other cereals, potatoes, beans, nuts and seeds  olive oil is an important monounsaturated fat source  dairy products, fish and poultry are consumed in low to moderate amounts, and little red meat is eaten  eggs are consumed zero to four times a week  wine is consumed in low to moderate amounts Does a Mediterranean-style diet follow American Heart Association dietary recommendations? Mediterranean-style diets are often close to our dietary recommendations, but they don't follow them exactly. In general, the diets of Mediterranean peoples contain a relatively high percentage of calories from fat. This is thought to contribute to the increasing obesity in these countries, which is becoming a concern. People who follow the average Mediterranean diet eat less saturated fat than those who eat the average American diet. In fact, saturated fat consumption is well within our dietary guidelines. More than half the fat calories in a Mediterranean diet come from monounsaturated fats (mainly from olive oil). Monounsaturated fat doesn't raise blood cholesterol levels the way saturated fat does. The incidence of heart disease in [place] countries is lower than in the [place]. Death rates are lower, too. But this may not be entirely due to the diet. Lifestyle factors (such as more physical activity and extended social support systems) may also play a part. Before advising people to follow a Mediterranean diet, we need more studies to find out whether  the diet itself or other lifestyle factors account for the lower deaths from heart disease.  American Heart  Association       Depression Screen PHQ 2/9 Scores 09/01/2019 04/15/2019 08/26/2018 07/03/2017 03/10/2016 12/03/2015 11/29/2015  PHQ - 2 Score 0 1 0 0 0 0 0  PHQ- 9 Score - 4 3 - - - -    Fall Risk Fall Risk  09/01/2019 09/02/2018 07/03/2017 04/10/2017 04/10/2017  Falls in the past year? 0 0 No Yes Yes  Number falls in past yr: 0 0 - 1 1  Injury with Fall? 0 0 - No No  Risk for fall due to : Impaired vision;Impaired balance/gait;Impaired mobility Impaired balance/gait - Other (Comment) Other (Comment)  Follow up Falls prevention discussed Falls evaluation completed - Education provided -    Any stairs in or around the home? No  If so, are there any without handrails? No  Home free of loose throw rugs in walkways, pet beds, electrical cords, etc? Yes  Adequate lighting in your home to reduce risk of falls? Yes   ASSISTIVE DEVICES UTILIZED TO PREVENT FALLS:  Life alert? No  however has an apple watch to alert emergency contacts if fallen Use of a cane, walker or w/c? Yes  at son's house with stairs Grab bars in the bathroom? Yes  Shower chair or bench in shower? No  Elevated toilet seat or a handicapped toilet? No   TIMED UP AND GO:  Was the test performed? No .   Cognitive Function: MMSE - Mini Mental State Exam 07/17/2017 06/22/2015  Not completed: (No Data) (No Data)     6CIT Screen 09/01/2019  What Year? 0 points  What month? 0 points  What time? 0 points  Count back from 20 0 points  Months in reverse 0 points  Repeat phrase 0 points  Total Score 0    Immunizations Immunization History  Administered Date(s) Administered  . Fluad Quad(high Dose 65+) 10/07/2018  . Influenza Split 01/21/2011  . Influenza Whole 11/19/2007  . Influenza, High Dose Seasonal PF 10/16/2015, 11/12/2016, 11/07/2017  . Influenza-Unspecified 12/18/2013  . PFIZER SARS-COV-2 Vaccination 03/17/2019, 04/11/2019  . Pneumococcal Conjugate-13 08/26/2013  . Pneumococcal Polysaccharide-23 02/04/2003,  06/08/2015  . Td 02/03/2005  . Zoster Recombinat (Shingrix) 02/18/2017, 07/19/2017    TDAP status: Due, Education has been provided regarding the importance of this vaccine. Advised may receive this vaccine at local pharmacy or Health Dept. Aware to provide a copy of the vaccination record if obtained from local pharmacy or Health Dept. Verbalized acceptance and understanding. Flu Vaccine status: Up to date Pneumococcal vaccine status: Up to date Covid-19 vaccine status: Completed vaccines  Qualifies for Shingles Vaccine? Yes   Zostavax completed No   Shingrix Completed?: Yes  Screening Tests Health Maintenance  Topic Date Due  . OPHTHALMOLOGY EXAM  07/21/2019  . DEXA SCAN  07/30/2019  . INFLUENZA VACCINE  09/04/2019  . HEMOGLOBIN A1C  10/16/2019  . FOOT EXAM  11/15/2019  . MAMMOGRAM  04/19/2020  . COLONOSCOPY  04/30/2022  . COVID-19 Vaccine  Completed  . Hepatitis C Screening  Completed  . PNA vac Low Risk Adult  Completed  . TETANUS/TDAP  Discontinued    Health Maintenance  Health Maintenance Due  Topic Date Due  . OPHTHALMOLOGY EXAM  07/21/2019  . DEXA SCAN  07/30/2019    Colorectal cancer screening: Completed 04/29/17. Repeat every 5 years Mammogram status: Completed 04/20/19. Repeat every year Bone Density status: Ordered 09/01/19. Pt provided with  contact info and advised to call to schedule appt.   Additional Screening:  Hepatitis C Screening: Completed 07/27/15  Vision Screening: Recommended annual ophthalmology exams for early detection of glaucoma and other disorders of the eye. Is the patient up to date with their annual eye exam?  Yes  Who is the provider or what is the name of the office in which the patient attends annual eye exams? [facility]   Dental Screening: Recommended annual dental exams for proper oral hygiene  Community Resource Referral / Chronic Care Management: CRR required this visit?  No   CCM required this visit?  No        Plan:     I have personally reviewed and noted the following in the patient's chart:   . Medical and social history . Use of alcohol, tobacco or illicit drugs  . Current medications and supplements . Functional ability and status . Nutritional status . Physical activity . Advanced directives . List of other physicians . Hospitalizations, surgeries, and ER visits in previous 12 months . Vitals . Screenings to include cognitive, depression, and falls . Referrals and appointments  In addition, I have reviewed and discussed with patient certain preventive protocols, quality metrics, and best practice recommendations. A written personalized care plan for preventive services as well as general preventive health recommendations were provided to patient.     Willette Brace, LPN   [id number]/[id number]   Nurse Notes: None

## 2019-09-06 ENCOUNTER — Encounter: Payer: Self-pay | Admitting: Family Medicine

## 2019-09-06 ENCOUNTER — Ambulatory Visit (INDEPENDENT_AMBULATORY_CARE_PROVIDER_SITE_OTHER): Payer: PPO | Admitting: Family Medicine

## 2019-09-06 ENCOUNTER — Other Ambulatory Visit: Payer: Self-pay

## 2019-09-06 VITALS — BP 130/68 | HR 59 | Temp 98.0°F | Resp 16 | Ht 61.0 in | Wt 240.0 lb

## 2019-09-06 DIAGNOSIS — E1159 Type 2 diabetes mellitus with other circulatory complications: Secondary | ICD-10-CM | POA: Diagnosis not present

## 2019-09-06 DIAGNOSIS — Z87891 Personal history of nicotine dependence: Secondary | ICD-10-CM

## 2019-09-06 DIAGNOSIS — E119 Type 2 diabetes mellitus without complications: Secondary | ICD-10-CM | POA: Diagnosis not present

## 2019-09-06 DIAGNOSIS — E782 Mixed hyperlipidemia: Secondary | ICD-10-CM | POA: Diagnosis not present

## 2019-09-06 DIAGNOSIS — Z0000 Encounter for general adult medical examination without abnormal findings: Secondary | ICD-10-CM

## 2019-09-06 DIAGNOSIS — M48062 Spinal stenosis, lumbar region with neurogenic claudication: Secondary | ICD-10-CM

## 2019-09-06 DIAGNOSIS — K219 Gastro-esophageal reflux disease without esophagitis: Secondary | ICD-10-CM

## 2019-09-06 DIAGNOSIS — R600 Localized edema: Secondary | ICD-10-CM | POA: Diagnosis not present

## 2019-09-06 DIAGNOSIS — I25119 Atherosclerotic heart disease of native coronary artery with unspecified angina pectoris: Secondary | ICD-10-CM

## 2019-09-06 DIAGNOSIS — F339 Major depressive disorder, recurrent, unspecified: Secondary | ICD-10-CM | POA: Diagnosis not present

## 2019-09-06 DIAGNOSIS — M816 Localized osteoporosis [Lequesne]: Secondary | ICD-10-CM | POA: Diagnosis not present

## 2019-09-06 DIAGNOSIS — D518 Other vitamin B12 deficiency anemias: Secondary | ICD-10-CM

## 2019-09-06 DIAGNOSIS — Z9884 Bariatric surgery status: Secondary | ICD-10-CM

## 2019-09-06 DIAGNOSIS — N2581 Secondary hyperparathyroidism of renal origin: Secondary | ICD-10-CM | POA: Diagnosis not present

## 2019-09-06 DIAGNOSIS — I10 Essential (primary) hypertension: Secondary | ICD-10-CM

## 2019-09-06 LAB — POCT GLYCOSYLATED HEMOGLOBIN (HGB A1C): Hemoglobin A1C: 6.3 % — AB (ref 4.0–5.6)

## 2019-09-06 MED ORDER — FUROSEMIDE 20 MG PO TABS: 20.0000 mg | ORAL_TABLET | ORAL | 5 refills | Status: DC | PRN

## 2019-09-06 NOTE — Patient Instructions (Signed)
Please return in 6 months for diabetes and blood pressure recheck.   Increase your lasix use and monitor your weights as discussed.  I'll look for your bone density results and start the prolia as we discussed.   I will release your lab results to you on your MyChart account with further instructions. Please reply with any questions.   If you have any questions or concerns, please don't hesitate to send me a message via MyChart or call the office at [phone]. Thank you for visiting with Tricia [name] today! It's our pleasure caring for you.   Preventive Care 72 Years and Older, Female Preventive care refers to lifestyle choices and visits with your health care provider that can promote health and wellness. This includes:  A yearly physical exam. This is also called an annual well check.  Regular dental and eye exams.  Immunizations.  Screening for certain conditions.  Healthy lifestyle choices, such as diet and exercise. What can I expect for my preventive care visit? Physical exam Your health care provider will check:  Height and weight. These may be used to calculate body mass index (BMI), which is a measurement that tells if you are at a healthy weight.  Heart rate and blood pressure.  Your skin for abnormal spots. Counseling Your health care provider may ask you questions about:  Alcohol, tobacco, and drug use.  Emotional well-being.  Home and relationship well-being.  Sexual activity.  Eating habits.  History of falls.  Memory and ability to understand (cognition).  Work and work Statistician.  Pregnancy and menstrual history. What immunizations do I need?  Influenza (flu) vaccine  This is recommended every year. Tetanus, diphtheria, and pertussis (Tdap) vaccine  You may need a Td booster every 10 years. Varicella (chickenpox) vaccine  You may need this vaccine if you have not already been vaccinated. Zoster (shingles) vaccine  You may need this after age  72. Pneumococcal conjugate (PCV13) vaccine  One dose is recommended after age 72. Pneumococcal polysaccharide (PPSV23) vaccine  One dose is recommended after age 72. Measles, mumps, and rubella (MMR) vaccine  You may need at least one dose of MMR if you were born in 1957 or later. You may also need a second dose. Meningococcal conjugate (MenACWY) vaccine  You may need this if you have certain conditions. Hepatitis A vaccine  You may need this if you have certain conditions or if you travel or work in places where you may be exposed to hepatitis A. Hepatitis B vaccine  You may need this if you have certain conditions or if you travel or work in places where you may be exposed to hepatitis B. Haemophilus influenzae type b (Hib) vaccine  You may need this if you have certain conditions. You may receive vaccines as individual doses or as more than one vaccine together in one shot (combination vaccines). Talk with your health care provider about the risks and benefits of combination vaccines. What tests do I need? Blood tests  Lipid and cholesterol levels. These may be checked every 5 years, or more frequently depending on your overall health.  Hepatitis C test.  Hepatitis B test. Screening  Lung cancer screening. You may have this screening every year starting at age 72 if you have a 30-pack-year history of smoking and currently smoke or have quit within the past 15 years.  Colorectal cancer screening. All adults should have this screening starting at age 72 and continuing until age 74. Your health care provider may recommend  screening at age 77 if you are at increased risk. You will have tests every 1-10 years, depending on your results and the type of screening test.  Diabetes screening. This is done by checking your blood sugar (glucose) after you have not eaten for a while (fasting). You may have this done every 1-3 years.  Mammogram. This may be done every 1-2 years. Talk with  your health care provider about how often you should have regular mammograms.  BRCA-related cancer screening. This may be done if you have a family history of breast, ovarian, tubal, or peritoneal cancers. Other tests  Sexually transmitted disease (STD) testing.  Bone density scan. This is done to screen for osteoporosis. You may have this done starting at age 72. Follow these instructions at home: Eating and drinking  Eat a diet that includes fresh fruits and vegetables, whole grains, lean protein, and low-fat dairy products. Limit your intake of foods with high amounts of sugar, saturated fats, and salt.  Take vitamin and mineral supplements as recommended by your health care provider.  Do not drink alcohol if your health care provider tells you not to drink.  If you drink alcohol: ? Limit how much you have to 0-1 drink a day. ? Be aware of how much alcohol is in your drink. In the [place] one drink equals one 12 oz bottle of beer (355 mL), one 5 oz glass of wine (148 mL), or one 1 oz glass of hard liquor (44 mL). Lifestyle  Take daily care of your teeth and gums.  Stay active. Exercise for at least 30 minutes on 5 or more days each week.  Do not use any products that contain nicotine or tobacco, such as cigarettes, e-cigarettes, and chewing tobacco. If you need help quitting, ask your health care provider.  If you are sexually active, practice safe sex. Use a condom or other form of protection in order to prevent STIs (sexually transmitted infections).  Talk with your health care provider about taking a low-dose aspirin or statin. What's next?  Go to your health care provider once a year for a well check visit.  Ask your health care provider how often you should have your eyes and teeth checked.  Stay up to date on all vaccines. This information is not intended to replace advice given to you by your health care provider. Make sure you discuss any questions you have with your  health care provider. Document Revised: 01/14/2018 Document Reviewed: 01/14/2018 Elsevier Patient Education  2020 [employer].

## 2019-09-06 NOTE — Progress Notes (Signed)
Subjective  Chief Complaint  Patient presents with  . Annual Exam    fasting  . Diabetes    127 AM at home  . Hypertension  . Hyperlipidemia  . Coronary Artery Disease  . Depression    HPI: Tricia [name] is a 74 y.o. female who presents to [place] [facility] today for a Female Wellness Visit. She also has the concerns and/or needs as listed above in the chief complaint. These will be addressed in addition to the Health Maintenance Visit.   Wellness Visit: annual visit with health maintenance review and exam without Pap   HM: screeens are up to date. Scheduled for lung cancer screening due to history of heavy tobacco use.  Needs diabetic eye exam- scheduled for next week. dexa is scheduled for august for f/u osteoporosis: had been on prolia in 2018 x 3 doses but then care got interrupted and it hasn't been restarted. She tolerated it well  Chronic disease f/u and/or acute problem visit: (deemed necessary to be done in addition to the wellness visit):  Diabetes follow up: Her diabetic control is reported as Unchanged. Diet controlled.  She denies exertional CP or SOB or symptomatic hypoglycemia. She denies foot sores or paresthesias.  HTN: better control and managed per cards. Has chronic lower ext edema on lasix daily; needing to increase dose twice weekly. Also on amlodipine - ? Contributing. Drinks 2 ltrs of water daily.   Cad has remained stable.   HLD on statin. Fasting for labs.goal ldl < 70  C/o feeling like something is in throat. No sneezing or obvious pnd. No sinus pain. Has h/o LPR and globus sensation evaluated and treated in past with allergy meds and PPI. I reviewed recent GI notes. Pt has little insight into dx but it is described in GI OV notes. She is on bid protonix and prn pepcid. Not on oral antihistamine. No dysphagia  Depression: remains well controlled on zoloft.   H/o gastric bypass due for vitamin level checks. Diet is fair.    Immunization History  Administered Date(s) Administered  . Fluad Quad(high Dose 65+) 10/07/2018  . Influenza Split 01/21/2011  . Influenza Whole 11/19/2007  . Influenza, High Dose Seasonal PF 10/16/2015, 11/12/2016, 11/07/2017  . Influenza-Unspecified 12/18/2013  . PFIZER SARS-COV-2 Vaccination 03/17/2019, 04/11/2019  . Pneumococcal Conjugate-13 08/26/2013  . Pneumococcal Polysaccharide-23 02/04/2003, 06/08/2015  . Td 02/03/2005  . Zoster Recombinat (Shingrix) 02/18/2017, 07/19/2017    Diabetes Related Lab Review: Lab Results  Component Value Date   HGBA1C 6.3 (A) 09/06/2019   HGBA1C 6.3 (A) 04/15/2019   HGBA1C 6.6 (H) 08/10/2018    Lab Results  Component Value Date   MICROALBUR 4.2 (H) 08/19/2013   Lab Results  Component Value Date   CREATININE 1.03 08/26/2018   BUN 39 (H) 08/26/2018   NA 139 08/26/2018   K 4.9 08/26/2018   CL 106 08/26/2018   CO2 25 08/26/2018   Lab Results  Component Value Date   CHOL 143 08/10/2018   CHOL 162 07/03/2017   CHOL 122 06/18/2016   Lab Results  Component Value Date   HDL 41.70 08/10/2018   HDL 48.60 07/03/2017   HDL 51.20 06/18/2016   Lab Results  Component Value Date   LDLCALC 71 08/10/2018   LDLCALC 91 07/03/2017   LDLCALC 54 06/18/2016   Lab Results  Component Value Date   TRIG 155.0 (H) 08/10/2018   TRIG 115.0 07/03/2017   TRIG 82.0 06/18/2016  Lab Results  Component Value Date   CHOLHDL 3 08/10/2018   CHOLHDL 3 07/03/2017   CHOLHDL 2 06/18/2016   Lab Results  Component Value Date   LDLDIRECT 86.0 09/11/2015   LDLDIRECT 160.7 09/20/2012   LDLDIRECT 180.3 05/16/2011   The ASCVD Risk score Mikey Bussing DC Jr., et al., 2013) failed to calculate for the following reasons:   The patient has a prior MI or stroke diagnosis  BP Readings from Last 3 Encounters:  09/06/19 130/68  05/17/19 120/60  04/15/19 (!) 156/78   Wt Readings from Last 3 Encounters:  09/06/19 240 lb (108.9 kg)  05/17/19 241 lb 12.8 oz (109.7 kg)   04/15/19 241 lb 9.6 oz (109.6 kg)    Health Maintenance  Topic Date Due  . OPHTHALMOLOGY EXAM  07/21/2019  . DEXA SCAN  07/30/2019  . INFLUENZA VACCINE  09/04/2019  . FOOT EXAM  11/15/2019  . HEMOGLOBIN A1C  03/08/2020  . MAMMOGRAM  04/19/2020  . COLONOSCOPY  04/30/2022  . COVID-19 Vaccine  Completed  . Hepatitis C Screening  Completed  . PNA vac Low Risk Adult  Completed  . TETANUS/TDAP  Discontinued     Assessment  1. Annual physical exam   2. Diet-controlled diabetes mellitus ([place])   3. Mixed hyperlipidemia   4. Coronary artery disease involving native coronary artery of native heart with angina pectoris ([place])   5. Hypertension associated with diabetes ([place])   6. Localized osteoporosis without current pathological fracture   7. Hyperparathyroidism, secondary ([place])   8. Status post gastric bypass for obesity   9. Anemia, B12 deficiency   10. Major depression, recurrent, chronic ([place])   11. Spinal stenosis of lumbar region with neurogenic claudication   12. Lower extremity edema   13. Former smoker, 50 pack years, quit 2010   14. Laryngopharyngeal reflux (LPR)      Plan  Female Wellness Visit:  Age appropriate Health Maintenance and Prevention measures were discussed with patient. Included topics are cancer screening recommendations, ways to keep healthy (see AVS) including dietary and exercise recommendations, regular eye and dental care, use of seat belts, and avoidance of moderate alcohol use and tobacco use.   BMI: discussed patient's BMI and encouraged positive lifestyle modifications to help get to or maintain a target BMI.  HM needs and immunizations were addressed and ordered. See below for orders. See HM and immunization section for updates.  Routine labs and screening tests ordered including cmp, cbc and lipids where appropriate.  Discussed recommendations regarding Vit D and calcium supplementation (see AVS)  Chronic disease management visit and/or acute  problem visit:  dexa to f/u osteoporosis: restart prolia if indicated the patient agrees  HTN is better controlled. Consider amlodipine as contributor to ankle edema  Leg edema, increase to 40 lasix 3x/ weekly  Diabetes is well controlled. Has eye exam scheduled. Check urine imms are up to date  Recheck vitamin levels and cbc. rec weight loss  Depression is stable. Continue zoloft until pandemic resolved.   Check pth.   Follow up: 6 months for f/u htn edema dm and depressoin  Orders Placed This Encounter  Procedures  . CBC with Differential/Platelet  . Comprehensive metabolic panel  . Lipid panel  . TSH  . Microalbumin / creatinine urine ratio  . Vitamin B12  . VITAMIN D 25 Hydroxy (Vit-D Deficiency, Fractures)  . PTH, intact (no Ca)  . POCT HgB A1C   Meds ordered this encounter  Medications  . furosemide (LASIX) 20  MG tablet    Sig: Take 1-2 tablets (20-40 mg total) by mouth as needed for fluid or edema.    Dispense:  60 tablet    Refill:  5    Please keep on file for next refill due.      Lifestyle: Body mass index is 45.35 kg/m. Wt Readings from Last 3 Encounters:  09/06/19 240 lb (108.9 kg)  05/17/19 241 lb 12.8 oz (109.7 kg)  04/15/19 241 lb 9.6 oz (109.6 kg)    Patient Active Problem List   Diagnosis Date Noted  . Diet-controlled diabetes mellitus ([place]) 03/04/2019    Priority: High  . Major depression, recurrent, chronic ([place]) 03/04/2019    Priority: High  . Mixed hyperlipidemia 03/04/2019    Priority: High  . Lumbar spinal stenosis 03/03/2019    Priority: High  . Coronary artery disease involving native coronary artery of native heart with angina pectoris ([place]) 03/10/2016    Priority: High  . History of ST elevation myocardial infarction (STEMI) 10/01/2015    Priority: High    Study Conclusions 10/02/15:  - Left ventricle: The cavity size was normal. Wall thickness was   normal. Systolic function was normal. The estimated ejection   fraction  was in the range of 60% to 65%. Wall motion was normal;   there were no regional wall motion abnormalities. - Aortic valve: There was mild regurgitation. - Right atrium: The atrium was mildly dilated. - Tricuspid valve: There was mild-moderate regurgitation.   . Osteoporosis, Dx in 2018, Rx Prolia 12/14/2006    Priority: High  . Hyperlipidemia associated with type 2 diabetes mellitus ([place]), LDL goal < 70, Rx Lipitor 09/22/2006    Priority: High  . Hypertension associated with diabetes ([place]) 09/22/2006    Priority: High  . Adenomatous polyp of colon 02/04/2004    Priority: High  . Hyperparathyroidism, secondary ([place]) 07/06/2016    Priority: Medium  . COPD (chronic obstructive pulmonary disease) ([place]) 04/19/2016    Priority: Medium    Asymptomatic: identified on CT 04/17/16, former smoker   . GERD (gastroesophageal reflux disease), Rx Protonix 02/14/2014    Priority: Medium  . Status post gastric bypass for obesity 02/14/2014    Priority: Medium  . Anemia, B12 deficiency 04/04/2009    Priority: Medium  . Osteoarthritis, multiple sites 09/22/2006    Priority: Medium    Throughout back, knees, hands    . Degenerative joint disease (DJD) of lumbar spine 09/22/2006    Priority: Medium  . Bilateral lower extremity edema 11/21/2018    Priority: Low  . PVC's (premature ventricular contractions) 04/16/2017    Priority: Low    Holter 3/19: NSR, average heart rate 69, frequent PVCs (5% total beats), rare supraventricular ectopics, no AF/flutter   . Laryngopharyngeal reflux (LPR) 09/06/2019  . Class 3 obesity with alveolar hypoventilation, serious comorbidity, and body mass index (BMI) of 45.0 to 49.9 in adult ([place]) 11/21/2018  . History of cardiac arrest 10/01/2015  . Former smoker, 50 pack years, quit 2010 02/14/2014   Health Maintenance  Topic Date Due  . OPHTHALMOLOGY EXAM  07/21/2019  . DEXA SCAN  07/30/2019  . INFLUENZA VACCINE  09/04/2019  . FOOT EXAM  11/15/2019  .  HEMOGLOBIN A1C  03/08/2020  . MAMMOGRAM  04/19/2020  . COLONOSCOPY  04/30/2022  . COVID-19 Vaccine  Completed  . Hepatitis C Screening  Completed  . PNA vac Low Risk Adult  Completed  . TETANUS/TDAP  Discontinued   Immunization History  Administered Date(s)  Administered  . Fluad Quad(high Dose 65+) 10/07/2018  . Influenza Split 01/21/2011  . Influenza Whole 11/19/2007  . Influenza, High Dose Seasonal PF 10/16/2015, 11/12/2016, 11/07/2017  . Influenza-Unspecified 12/18/2013  . PFIZER SARS-COV-2 Vaccination 03/17/2019, 04/11/2019  . Pneumococcal Conjugate-13 08/26/2013  . Pneumococcal Polysaccharide-23 02/04/2003, 06/08/2015  . Td 02/03/2005  . Zoster Recombinat (Shingrix) 02/18/2017, 07/19/2017   We updated and reviewed the patient's past history in detail and it is documented below. Allergies: Patient is allergic to sulfa antibiotics. 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 Past Surgical History Patient  has a past surgical history that includes Abdominal hysterectomy; Bariatric Surgery; Coronary angioplasty (2002); Appendectomy; Carpal tunnel release (Bilateral); Hernia repair; Tonsillectomy; Total knee arthroplasty (Left); Nephrectomy (Right); Lumbar laminectomy; Cholecystectomy; Tubal ligation; Cervical disc surgery; Cardiac catheterization (N/A, 10/01/2015); Cardiac catheterization (N/A, 10/01/2015); Cardiac catheterization  (N/A, 10/03/2015); Cardiac catheterization (N/A, 10/03/2015); and Ulnar nerve repair (Left). Family History: Patient family history includes Colitis in her mother; Heart disease in her father; Lung cancer in her maternal uncle and maternal uncle; Stomach cancer in her maternal aunt. Social History:  Patient  reports that she quit smoking about 7 years ago. Her smoking use included cigarettes. She has a 82.00 pack-year smoking history. She has never used smokeless tobacco. She reports that she does not drink alcohol and does not use drugs.  Review of Systems: Constitutional: negative for fever or malaise Ophthalmic: negative for photophobia, double vision or loss of vision Cardiovascular: negative for chest pain, dyspnea on exertion, or new LE swelling Respiratory: negative for SOB or persistent cough Gastrointestinal: negative for abdominal pain, change in bowel habits or melena Genitourinary: negative for dysuria or gross hematuria, no abnormal uterine bleeding or disharge Musculoskeletal: negative for new gait disturbance or muscular weakness Integumentary: negative for new or persistent rashes, no breast lumps Neurological: negative for TIA or stroke symptoms Psychiatric: negative for SI or delusions Allergic/Immunologic: negative for hives  Patient Care Team    Relationship Specialty Notifications Start End  Leamon Arnt, MD PCP - General Family Medicine  03/03/19   Sherren Mocha, MD PCP - Cardiology Cardiology Admissions 05/16/19   Ladene Artist, MD Consulting Physician Gastroenterology  08/31/17   Sharmon Revere Physician Assistant Cardiology  08/31/17   Gardiner Barefoot, DPM Consulting Physician Podiatry  08/31/17   Sherren Mocha, MD Consulting Physician Cardiology  03/03/19   Jovita Gamma, MD Consulting Physician Neurosurgery  03/03/19     Objective  Vitals: BP 130/68   Pulse (!) 59   Temp 98 F (36.7 C) (Temporal)   Resp 16   Ht 5\' 1"  (1.549 m)   Wt 240 lb  (108.9 kg)   LMP  (LMP Unknown)   SpO2 97%   BMI 45.35 kg/m  General:  Well developed, well nourished, no acute distress  Psych:  Alert and orientedx3,normal mood and affect HEENT:  Normocephalic, atraumatic, non-icteric sclera,  supple neck without adenopathy, mass or thyromegaly Cardiovascular:  Normal S1, S2, RRR without gallop, rub or murmur. +1 pitting edema bilateral ankles Respiratory:  Good breath sounds bilaterally, CTAB with normal respiratory effort, tender lower  ribs with ecchymosis.  Gastrointestinal: normal bowel sounds, soft, non-tender, no noted masses. No HSM MSK: no deformities, contusions. Joints are without erythema or swelling.  Skin:  Warm, no rashes or suspicious lesions noted Neurologic:    Mental status is normal. CN 2-11 are normal. Gross motor and sensory exams are normal. Normal gait. No tremor Breast Exam: No mass, skin retraction or nipple discharge is appreciated in either breast. No axillary adenopathy. Fibrocystic changes are not noted    Commons side effects, risks, benefits, and alternatives for medications and treatment plan prescribed today were discussed, and the patient expressed understanding of the given instructions. Patient is instructed to call or message via MyChart if he/she has any questions or concerns regarding our treatment plan. No barriers to understanding were identified. We discussed Red Flag symptoms and signs in detail. Patient expressed understanding regarding what to do in case of urgent or emergency type symptoms.   Medication list was reconciled, printed and provided to the patient in AVS. Patient instructions and summary information was reviewed with the patient as documented in the AVS. This note was prepared with assistance of Dragon voice recognition software. Occasional wrong-word or sound-a-like substitutions may have occurred due to the inherent limitations of voice recognition software  This visit occurred during the  SARS-CoV-2 public health emergency.  Safety protocols were in place, including screening questions prior to the visit, additional usage of staff PPE, and extensive cleaning of exam room while observing appropriate contact time as indicated for disinfecting solutions.

## 2019-09-07 ENCOUNTER — Other Ambulatory Visit: Payer: Self-pay

## 2019-09-07 ENCOUNTER — Encounter: Payer: Self-pay | Admitting: Family Medicine

## 2019-09-07 DIAGNOSIS — R600 Localized edema: Secondary | ICD-10-CM

## 2019-09-07 DIAGNOSIS — M159 Polyosteoarthritis, unspecified: Secondary | ICD-10-CM

## 2019-09-07 DIAGNOSIS — M8949 Other hypertrophic osteoarthropathy, multiple sites: Secondary | ICD-10-CM

## 2019-09-07 LAB — CBC WITH DIFFERENTIAL/PLATELET
Absolute Monocytes: 428 cells/uL (ref 200–950)
Basophils Absolute: 37 cells/uL (ref 0–200)
Basophils Relative: 0.6 %
Eosinophils Absolute: 143 cells/uL (ref 15–500)
Eosinophils Relative: 2.3 %
HCT: 40.3 % (ref 35.0–45.0)
Hemoglobin: 13.2 g/dL (ref 11.7–15.5)
Lymphs Abs: 1370 cells/uL (ref 850–3900)
MCH: 28.3 pg (ref 27.0–33.0)
MCHC: 32.8 g/dL (ref 32.0–36.0)
MCV: 86.3 fL (ref 80.0–100.0)
MPV: 9.8 fL (ref 7.5–12.5)
Monocytes Relative: 6.9 %
Neutro Abs: 4222 cells/uL (ref 1500–7800)
Neutrophils Relative %: 68.1 %
Platelets: 231 10*3/uL (ref 140–400)
RBC: 4.67 10*6/uL (ref 3.80–5.10)
RDW: 12.5 % (ref 11.0–15.0)
Total Lymphocyte: 22.1 %
WBC: 6.2 10*3/uL (ref 3.8–10.8)

## 2019-09-07 LAB — TSH: TSH: 4.05 m[IU]/L (ref 0.40–4.50)

## 2019-09-07 LAB — COMPREHENSIVE METABOLIC PANEL
AG Ratio: 1.8 (calc) (ref 1.0–2.5)
ALT: 9 U/L (ref 6–29)
AST: 13 U/L (ref 10–35)
Albumin: 4.1 g/dL (ref 3.6–5.1)
Alkaline phosphatase (APISO): 192 U/L — ABNORMAL HIGH (ref 37–153)
BUN/Creatinine Ratio: 23 (calc) — ABNORMAL HIGH (ref 6–22)
BUN: 27 mg/dL — ABNORMAL HIGH (ref 7–25)
CO2: 28 mmol/L (ref 20–32)
Calcium: 9.4 mg/dL (ref 8.6–10.4)
Chloride: 99 mmol/L (ref 98–110)
Creat: 1.16 mg/dL — ABNORMAL HIGH (ref 0.60–0.93)
Globulin: 2.3 g/dL (calc) (ref 1.9–3.7)
Glucose, Bld: 126 mg/dL — ABNORMAL HIGH (ref 65–99)
Potassium: 3.7 mmol/L (ref 3.5–5.3)
Sodium: 136 mmol/L (ref 135–146)
Total Bilirubin: 0.7 mg/dL (ref 0.2–1.2)
Total Protein: 6.4 g/dL (ref 6.1–8.1)

## 2019-09-07 LAB — LIPID PANEL
Cholesterol: 177 mg/dL (ref <200)
HDL: 45 mg/dL — ABNORMAL LOW (ref >=50)
LDL Cholesterol (Calc): 105 mg/dL (calc) — ABNORMAL HIGH
Non-HDL Cholesterol (Calc): 132 mg/dL (calc) — ABNORMAL HIGH (ref <130)
Total CHOL/HDL Ratio: 3.9 (calc) (ref <5.0)
Triglycerides: 152 mg/dL — ABNORMAL HIGH (ref <150)

## 2019-09-07 LAB — MICROALBUMIN / CREATININE URINE RATIO
Creatinine, Urine: 59 mg/dL (ref 20–275)
Microalb Creat Ratio: 24 mcg/mg creat (ref <30)
Microalb, Ur: 1.4 mg/dL

## 2019-09-07 LAB — VITAMIN B12: Vitamin B-12: 564 pg/mL (ref 200–1100)

## 2019-09-07 LAB — PARATHYROID HORMONE, INTACT (NO CA): PTH: 92 pg/mL — ABNORMAL HIGH (ref 14–64)

## 2019-09-07 LAB — VITAMIN D 25 HYDROXY (VIT D DEFICIENCY, FRACTURES): Vit D, 25-Hydroxy: 28 ng/mL — ABNORMAL LOW (ref 30–100)

## 2019-09-07 MED ORDER — FUROSEMIDE 20 MG PO TABS: 20.0000 mg | ORAL_TABLET | ORAL | 3 refills | Status: DC | PRN

## 2019-09-07 NOTE — Progress Notes (Signed)
Please call patient: I have reviewed his/her lab results. See result notes below; IF pt has been regularly taking her atorvastatin 80 nightly, then order zetia 10mg  nightly to take in addition.  Also notify her of other information in note below. Thanks.

## 2019-09-08 ENCOUNTER — Encounter: Payer: Self-pay | Admitting: Family Medicine

## 2019-09-08 ENCOUNTER — Other Ambulatory Visit: Payer: Self-pay

## 2019-09-08 DIAGNOSIS — Z905 Acquired absence of kidney: Secondary | ICD-10-CM | POA: Insufficient documentation

## 2019-09-08 MED ORDER — EZETIMIBE 10 MG PO TABS
10.0000 mg | ORAL_TABLET | Freq: Every day | ORAL | 3 refills | Status: DC
Start: 2019-09-08 — End: 2020-08-14

## 2019-09-09 ENCOUNTER — Telehealth: Payer: Self-pay | Admitting: Family Medicine

## 2019-09-09 NOTE — Telephone Encounter (Signed)
Tricia [name] is calling and wanted to speak to someone regarding Lasix and needed clarity on dosage, please advise. CB is [phone]

## 2019-09-12 ENCOUNTER — Other Ambulatory Visit: Payer: Self-pay

## 2019-09-12 DIAGNOSIS — R600 Localized edema: Secondary | ICD-10-CM

## 2019-09-12 MED ORDER — POTASSIUM CHLORIDE ER 10 MEQ PO TBCR: 10.0000 meq | EXTENDED_RELEASE_TABLET | ORAL | 3 refills | Status: DC | PRN

## 2019-09-12 NOTE — Telephone Encounter (Signed)
Spoke with patient regarding lasix script

## 2019-09-14 ENCOUNTER — Other Ambulatory Visit: Payer: Self-pay | Admitting: Family Medicine

## 2019-09-14 DIAGNOSIS — R600 Localized edema: Secondary | ICD-10-CM

## 2019-09-14 DIAGNOSIS — E119 Type 2 diabetes mellitus without complications: Secondary | ICD-10-CM | POA: Diagnosis not present

## 2019-09-14 LAB — HM DIABETES EYE EXAM

## 2019-09-15 ENCOUNTER — Other Ambulatory Visit: Payer: Self-pay

## 2019-09-15 ENCOUNTER — Ambulatory Visit (HOSPITAL_BASED_OUTPATIENT_CLINIC_OR_DEPARTMENT_OTHER)
Admission: RE | Admit: 2019-09-15 | Discharge: 2019-09-15 | Disposition: A | Discharge status: Home / Self Care | Payer: PPO | Source: Ambulatory Visit | Attending: Family Medicine | Admitting: Family Medicine

## 2019-09-15 DIAGNOSIS — M810 Age-related osteoporosis without current pathological fracture: Secondary | ICD-10-CM | POA: Insufficient documentation

## 2019-09-15 DIAGNOSIS — M85851 Other specified disorders of bone density and structure, right thigh: Secondary | ICD-10-CM | POA: Diagnosis not present

## 2019-09-15 DIAGNOSIS — R29890 Loss of height: Secondary | ICD-10-CM | POA: Diagnosis not present

## 2019-09-15 DIAGNOSIS — Z780 Asymptomatic menopausal state: Secondary | ICD-10-CM | POA: Diagnosis not present

## 2019-09-16 MED ORDER — HYDROCODONE-ACETAMINOPHEN 10-325 MG PO TABS: 1.0000 | ORAL_TABLET | Freq: Every day | ORAL | 0 refills | Status: DC | PRN

## 2019-10-04 ENCOUNTER — Encounter: Payer: Self-pay | Admitting: Family Medicine

## 2019-10-05 ENCOUNTER — Other Ambulatory Visit: Payer: Self-pay | Admitting: Family Medicine

## 2019-10-05 DIAGNOSIS — R600 Localized edema: Secondary | ICD-10-CM

## 2019-10-06 ENCOUNTER — Telehealth: Payer: Self-pay | Admitting: Family Medicine

## 2019-10-06 NOTE — Telephone Encounter (Signed)
..   LAST APPOINTMENT DATE: 10/05/2019   NEXT APPOINTMENT DATE:@2 /05/2020  MEDICATION:potassium chloride (KLOR-CON) 10 MEQ tablet  PHARMACY: [facility] [place] [place] [facility] [address]   **Let patient know to contact pharmacy at the end of the day to make sure medication is ready. **  ** Please notify patient to allow 48-72 hours to process**  **Encourage patient to contact the pharmacy for refills or they can request refills through [facility]**  CLINICAL FILLS OUT ALL BELOW:   LAST REFILL:  QTY:  REFILL DATE:    OTHER COMMENTS:    Okay for refill?  Please advise

## 2019-10-06 NOTE — Telephone Encounter (Signed)
Script sent to pharmacy.

## 2019-10-11 ENCOUNTER — Telehealth: Payer: Self-pay | Admitting: Family Medicine

## 2019-10-11 NOTE — Telephone Encounter (Signed)
Pharmacy calling to request Ferrous Sulfate 325 mg / 1 tablet by mouth every other day,  to be refilled for patient.  States that this was last prescribed by Dr. Juleen China.    Did not see on current med list.   Patient last seen 09/06/19.  Patient's next appt 09/06/20.

## 2019-10-13 NOTE — Telephone Encounter (Signed)
Tricia [name] is calling in from [facility], states that the script is expired and that is the reason they are needing a new one.

## 2019-10-13 NOTE — Telephone Encounter (Signed)
Patient has been notified via mychart message

## 2019-10-13 NOTE — Telephone Encounter (Signed)
See below

## 2019-10-13 NOTE — Telephone Encounter (Signed)
Anemia has resolved.  Can stop the iron supplement and I will recheck at next visit.   Thanks.

## 2019-10-14 ENCOUNTER — Other Ambulatory Visit: Payer: Self-pay | Admitting: Family Medicine

## 2019-10-14 DIAGNOSIS — R600 Localized edema: Secondary | ICD-10-CM

## 2019-10-14 DIAGNOSIS — M5441 Lumbago with sciatica, right side: Secondary | ICD-10-CM

## 2019-10-14 DIAGNOSIS — G8929 Other chronic pain: Secondary | ICD-10-CM

## 2019-10-15 ENCOUNTER — Other Ambulatory Visit: Payer: Self-pay | Admitting: Physician Assistant

## 2019-10-15 DIAGNOSIS — R600 Localized edema: Secondary | ICD-10-CM

## 2019-10-18 ENCOUNTER — Other Ambulatory Visit: Payer: Self-pay

## 2019-10-18 ENCOUNTER — Ambulatory Visit (INDEPENDENT_AMBULATORY_CARE_PROVIDER_SITE_OTHER): Payer: PPO

## 2019-10-18 ENCOUNTER — Encounter: Payer: Self-pay | Admitting: Family Medicine

## 2019-10-18 DIAGNOSIS — Z23 Encounter for immunization: Secondary | ICD-10-CM | POA: Diagnosis not present

## 2019-10-21 ENCOUNTER — Encounter: Payer: Self-pay | Admitting: Family Medicine

## 2019-11-03 ENCOUNTER — Telehealth: Payer: Self-pay

## 2019-11-03 ENCOUNTER — Emergency Department (HOSPITAL_COMMUNITY)
Admission: EM | Admit: 2019-11-03 | Discharge: 2019-11-03 | Disposition: A | Discharge status: Home / Self Care | Payer: PPO | Attending: Emergency Medicine | Admitting: Emergency Medicine

## 2019-11-03 ENCOUNTER — Emergency Department (HOSPITAL_COMMUNITY): Payer: PPO

## 2019-11-03 DIAGNOSIS — N289 Disorder of kidney and ureter, unspecified: Secondary | ICD-10-CM | POA: Diagnosis not present

## 2019-11-03 DIAGNOSIS — E119 Type 2 diabetes mellitus without complications: Secondary | ICD-10-CM | POA: Diagnosis not present

## 2019-11-03 DIAGNOSIS — I2510 Atherosclerotic heart disease of native coronary artery without angina pectoris: Secondary | ICD-10-CM | POA: Insufficient documentation

## 2019-11-03 DIAGNOSIS — E211 Secondary hyperparathyroidism, not elsewhere classified: Secondary | ICD-10-CM | POA: Diagnosis not present

## 2019-11-03 DIAGNOSIS — Z79899 Other long term (current) drug therapy: Secondary | ICD-10-CM | POA: Insufficient documentation

## 2019-11-03 DIAGNOSIS — I110 Hypertensive heart disease with heart failure: Secondary | ICD-10-CM | POA: Insufficient documentation

## 2019-11-03 DIAGNOSIS — R55 Syncope and collapse: Secondary | ICD-10-CM | POA: Diagnosis not present

## 2019-11-03 DIAGNOSIS — R197 Diarrhea, unspecified: Secondary | ICD-10-CM | POA: Diagnosis not present

## 2019-11-03 DIAGNOSIS — Z7951 Long term (current) use of inhaled steroids: Secondary | ICD-10-CM | POA: Insufficient documentation

## 2019-11-03 DIAGNOSIS — I509 Heart failure, unspecified: Secondary | ICD-10-CM | POA: Diagnosis not present

## 2019-11-03 DIAGNOSIS — R001 Bradycardia, unspecified: Secondary | ICD-10-CM | POA: Insufficient documentation

## 2019-11-03 DIAGNOSIS — R079 Chest pain, unspecified: Secondary | ICD-10-CM | POA: Diagnosis not present

## 2019-11-03 DIAGNOSIS — J449 Chronic obstructive pulmonary disease, unspecified: Secondary | ICD-10-CM | POA: Diagnosis not present

## 2019-11-03 DIAGNOSIS — I951 Orthostatic hypotension: Secondary | ICD-10-CM

## 2019-11-03 DIAGNOSIS — N261 Atrophy of kidney (terminal): Secondary | ICD-10-CM | POA: Diagnosis not present

## 2019-11-03 DIAGNOSIS — Z87891 Personal history of nicotine dependence: Secondary | ICD-10-CM | POA: Insufficient documentation

## 2019-11-03 DIAGNOSIS — N179 Acute kidney failure, unspecified: Secondary | ICD-10-CM

## 2019-11-03 DIAGNOSIS — N3000 Acute cystitis without hematuria: Secondary | ICD-10-CM

## 2019-11-03 DIAGNOSIS — R531 Weakness: Secondary | ICD-10-CM | POA: Diagnosis not present

## 2019-11-03 DIAGNOSIS — E860 Dehydration: Secondary | ICD-10-CM | POA: Diagnosis not present

## 2019-11-03 DIAGNOSIS — I213 ST elevation (STEMI) myocardial infarction of unspecified site: Secondary | ICD-10-CM | POA: Insufficient documentation

## 2019-11-03 DIAGNOSIS — I700 Atherosclerosis of aorta: Secondary | ICD-10-CM | POA: Diagnosis not present

## 2019-11-03 LAB — CBC
HCT: 39.2 % (ref 36.0–46.0)
Hemoglobin: 12.1 g/dL (ref 12.0–15.0)
MCH: 28 pg (ref 26.0–34.0)
MCHC: 30.9 g/dL (ref 30.0–36.0)
MCV: 90.7 fL (ref 80.0–100.0)
Platelets: 299 10*3/uL (ref 150–400)
RBC: 4.32 MIL/uL (ref 3.87–5.11)
RDW: 13.1 % (ref 11.5–15.5)
WBC: 9.2 10*3/uL (ref 4.0–10.5)
nRBC: 0 % (ref 0.0–0.2)

## 2019-11-03 LAB — URINALYSIS, ROUTINE W REFLEX MICROSCOPIC
Bilirubin Urine: NEGATIVE
Glucose, UA: NEGATIVE mg/dL
Hgb urine dipstick: NEGATIVE
Ketones, ur: NEGATIVE mg/dL
Nitrite: NEGATIVE
Protein, ur: NEGATIVE mg/dL
Specific Gravity, Urine: 1.012 (ref 1.005–1.030)
pH: 5 (ref 5.0–8.0)

## 2019-11-03 LAB — BASIC METABOLIC PANEL
Anion gap: 10 (ref 5–15)
BUN: 71 mg/dL — ABNORMAL HIGH (ref 8–23)
CO2: 21 mmol/L — ABNORMAL LOW (ref 22–32)
Calcium: 9.5 mg/dL (ref 8.9–10.3)
Chloride: 108 mmol/L (ref 98–111)
Creatinine, Ser: 2.42 mg/dL — ABNORMAL HIGH (ref 0.44–1.00)
GFR calc Af Amer: 22 mL/min — ABNORMAL LOW (ref >60)
GFR calc non Af Amer: 19 mL/min — ABNORMAL LOW (ref >60)
Glucose, Bld: 142 mg/dL — ABNORMAL HIGH (ref 70–99)
Potassium: 5.5 mmol/L — ABNORMAL HIGH (ref 3.5–5.1)
Sodium: 139 mmol/L (ref 135–145)

## 2019-11-03 LAB — CBG MONITORING, ED: Glucose-Capillary: 102 mg/dL — ABNORMAL HIGH (ref 70–99)

## 2019-11-03 LAB — TROPONIN I (HIGH SENSITIVITY)
Troponin I (High Sensitivity): 6 ng/L (ref <18)
Troponin I (High Sensitivity): 6 ng/L (ref <18)

## 2019-11-03 MED ORDER — SODIUM CHLORIDE 0.9 % IV BOLUS
1000.0000 mL | Freq: Once | INTRAVENOUS | Status: AC
  Administered 2019-11-03: 1000 mL via INTRAVENOUS

## 2019-11-03 MED ORDER — BACLOFEN 10 MG PO TABS
10.0000 mg | ORAL_TABLET | Freq: Once | ORAL | Status: AC
  Administered 2019-11-03: 10 mg via ORAL
  Filled 2019-11-03: qty 1

## 2019-11-03 MED ORDER — SERTRALINE HCL 50 MG PO TABS
50.0000 mg | ORAL_TABLET | Freq: Every day | ORAL | Status: DC
  Administered 2019-11-03: 50 mg via ORAL
  Filled 2019-11-03: qty 1

## 2019-11-03 MED ORDER — CEPHALEXIN 500 MG PO CAPS
500.0000 mg | ORAL_CAPSULE | Freq: Three times a day (TID) | ORAL | 0 refills | Status: DC
Start: 2019-11-03 — End: 2019-11-07

## 2019-11-03 MED ORDER — CEPHALEXIN 250 MG PO CAPS
500.0000 mg | ORAL_CAPSULE | Freq: Once | ORAL | Status: AC
  Administered 2019-11-03: 500 mg via ORAL
  Filled 2019-11-03: qty 2

## 2019-11-03 NOTE — ED Notes (Signed)
Patient verbalizes understanding of discharge instructions. Opportunity for questioning and answers were provided. Armband removed by staff, pt discharged from ED stable & ambulatory  

## 2019-11-03 NOTE — ED Notes (Signed)
Patient transported to CT 

## 2019-11-03 NOTE — Telephone Encounter (Signed)
FYI, patient is currently checked into [facility] ED

## 2019-11-03 NOTE — Telephone Encounter (Signed)
Pt was triaged today for having a reaction to her 3rd covid booster shot. Pt received her booster shot and collapsed about 10 min after. Pt also has a BP reading of 99/53. [facility] nurse told pt to go to call 911. Pt refused and said pt was going to call her son and have him take her to the ED.

## 2019-11-03 NOTE — Discharge Instructions (Addendum)
You need to get your kidney function rechecked by your primary care doctor in the next few days.  Hold your blood pressure medications tonight and tomorrow.  Recheck your blood pressure before starting them back up.  OTC probiotics/food with active cultures for diarrhea.

## 2019-11-03 NOTE — ED Provider Notes (Signed)
[facility] EMERGENCY DEPARTMENT Provider Note   CSN: [id number] Arrival date & time: 11/03/19  1429     History No chief complaint on file.   Tricia [name] is a 74 y.o. female.  Pt presents to the ED today with syncope.  Pt said it occurred 18 minutes after getting her Covid booster.  Pt said she stood up and was at the counter when she passed out.  Pt went home and bp was low at home (SBP 89).  Pt has had 4 days of diarrhea.  She is also worried she has a UTI.  She said she's been drinking water.  No vomiting.        Past Medical History:  Diagnosis Date  . Adenomatous polyp of colon 02/2004  . Anemia   . Anxiety   . Arthritis   . B12 deficiency   . CAD (coronary artery disease)    a. s/p remote BMS to LAD;  b. 09/2015 Inf STEMI/VF Arrest: LM nl, LAD 85p (staged PCI 2 days later w/ 3.0x32 Synergy DES), 40p/m, 25d, LCX 53m, RCA 80ost/100p (2.75x32 Synergy DES), 51m, EF 55-65%. // c. Myoview 2/18: EF 59, no ischemia or infarction; Normal study  . Dermatophytosis of groin and perianal area   . Diet Controlled Diabetes Mellitus   . Diverticulosis   . GERD (gastroesophageal reflux disease)   . H/O echocardiogram    a. 09/2015 Echo: EF 60-65%, no rwma, mild AI, mildly dil RA, mild to mod TR.  Marland Kitchen Hyperlipidemia   . Hypertensive heart disease   . Low back pain    l5 disc  . Lumbar spinal stenosis 03/03/2019  . Morbid obesity ([place])   . Osteoporosis   . PVC's (premature ventricular contractions) 04/16/2017   Holter 3/19: NSR, average heart rate 69, frequent PVCs (5% total beats), rare supraventricular ectopics, no AF/flutter  . Sleep apnea 10/03/2009   Resolved after gastric bypass    . TOBACCO ABUSE 10/05/2009   Qualifier: Diagnosis of  By: Stanford Breed, MD, Kandyce Rud   . Ventricular fibrillation ([place]) 10/01/2015   a. In setting of inferior STEMI.    Patient Active Problem List   Diagnosis Date Noted  . History of partial nephrectomy 09/08/2019  .  Laryngopharyngeal reflux (LPR) 09/06/2019  . Diet-controlled diabetes mellitus ([place]) 03/04/2019  . Major depression, recurrent, chronic ([place]) 03/04/2019  . Mixed hyperlipidemia 03/04/2019  . Lumbar spinal stenosis 03/03/2019  . Bilateral lower extremity edema 11/21/2018  . Class 3 obesity with alveolar hypoventilation, serious comorbidity, and body mass index (BMI) of 45.0 to 49.9 in adult ([place]) 11/21/2018  . PVC's (premature ventricular contractions) 04/16/2017  . Hyperparathyroidism, secondary ([place]) 07/06/2016  . COPD (chronic obstructive pulmonary disease) ([place]) 04/19/2016  . Coronary artery disease involving native coronary artery of native heart with angina pectoris ([place]) 03/10/2016  . History of cardiac arrest 10/01/2015  . History of ST elevation myocardial infarction (STEMI) 10/01/2015  . GERD (gastroesophageal reflux disease), Rx Protonix 02/14/2014  . Status post gastric bypass for obesity 02/14/2014  . Former smoker, 50 pack years, quit 2010 02/14/2014  . Anemia, B12 deficiency 04/04/2009  . Osteoporosis 12/14/2006  . Hyperlipidemia associated with type 2 diabetes mellitus ([place]), LDL goal < 70, Rx Lipitor 09/22/2006  . Hypertension associated with diabetes ([place]) 09/22/2006  . Osteoarthritis, multiple sites 09/22/2006  . 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 Adenomatous polyp of colon 02/04/2004    Past Surgical History:  Procedure Laterality Date  .  ABDOMINAL HYSTERECTOMY    . APPENDECTOMY    . BARIATRIC SURGERY    . CARDIAC CATHETERIZATION N/A 10/01/2015   Procedure: Left Heart Cath and Coronary Angiography;  Surgeon: Leonie Man, MD;  Location: [facility];  Service: Cardiovascular;  Laterality: N/A;  . CARDIAC CATHETERIZATION N/A 10/01/2015   Procedure: Coronary Stent Intervention;  Surgeon: Leonie Man, MD;  Location: [facility];  Service: Cardiovascular;  Laterality: N/A;  . CARDIAC CATHETERIZATION N/A 10/03/2015    Procedure: Coronary Stent Intervention;  Surgeon: Troy Sine, MD;  Location: [facility];  Service: Cardiovascular;  Laterality: N/A;  . CARDIAC CATHETERIZATION N/A 10/03/2015   Procedure: Coronary/Graft Angiography;  Surgeon: Troy Sine, MD;  Location: [facility];  Service: Cardiovascular;  Laterality: N/A;  . CARPAL TUNNEL RELEASE Bilateral   . CERVICAL DISC SURGERY    . CHOLECYSTECTOMY    . CORONARY ANGIOPLASTY  2002  . HERNIA REPAIR    . LUMBAR LAMINECTOMY     L3-4  . NEPHRECTOMY Right    Partial  . TONSILLECTOMY    . TOTAL KNEE ARTHROPLASTY Left   . TUBAL LIGATION    . ULNAR NERVE REPAIR Left      OB History   No obstetric history on file.     Family History  Problem Relation Age of Onset  . Colitis Mother        sepsis from c dif colitis  . Heart disease Father   . Lung cancer Maternal Uncle   . Stomach cancer Maternal Aunt   . Lung cancer Maternal Uncle   . Colon cancer Neg Hx     Social History   Tobacco Use  . Smoking status: Former Smoker    Packs/day: 2.00    Years: 41.00    Pack years: 82.00    Types: Cigarettes    Quit date: 03/06/2012    Years since quitting: 7.6  . Smokeless tobacco: Never Used  Vaping Use  . Vaping Use: Never used  Substance Use Topics  . Alcohol use: No    Alcohol/week: 0.0 standard drinks  . Drug use: No    Home Medications Prior to Admission medications   Medication Sig Start Date End Date Taking? Authorizing Provider  albuterol (PROVENTIL HFA;VENTOLIN HFA) 108 (90 Base) MCG/ACT inhaler Inhale 2 puffs into the lungs every 6 (six) hours as needed for wheezing or shortness of breath. 05/13/18   Briscoe Deutscher, DO  amLODipine (NORVASC) 5 MG tablet Take 1 tablet (5 mg total) by mouth daily. 05/19/19   Sherren Mocha, MD  aspirin EC 81 MG tablet Take 81 mg by mouth daily.    [provider]  atorvastatin (LIPITOR) 80 MG tablet Take 1 tablet by mouth daily. 02/16/19   Richardson Dopp T, PA-C  baclofen  (LIORESAL) 10 MG tablet Take 1 tablet by mouth twice daily. 08/15/19   Leamon Arnt, MD  carvedilol (COREG) 3.125 MG tablet Take 1 tablet (3.125 mg total) by mouth 2 (two) times daily with a meal. 05/17/19   Kathlen Mody, Nicki Reaper T, PA-C  carvedilol (COREG) 6.25 MG tablet Take 1 tablet (6.25 mg total) by mouth 2 (two) times daily with a meal. 05/17/19   Weaver, Nicki Reaper T, PA-C  cephALEXin (KEFLEX) 500 MG capsule Take 1 capsule (500 mg total) by mouth 3 (three) times daily. 11/03/19   Isla Pence, MD  Cholecalciferol (VITAMIN D) 50 MCG (2000 UT) tablet Take 1 tablet (2,000 Units total) by  mouth daily. 03/03/19   Leamon Arnt, MD  clopidogrel (PLAVIX) 75 MG tablet Take 1 tablet by mouth daily. 08/16/19   Richardson Dopp T, PA-C  Cyanocobalamin (B-12) 1000 MCG SUBL Place 1 tablet under the tongue daily. Patient not taking: Reported on 09/01/2019 08/26/18   Briscoe Deutscher, DO  ezetimibe (ZETIA) 10 MG tablet Take 1 tablet (10 mg total) by mouth daily. 09/08/19   Leamon Arnt, MD  famotidine (PEPCID) 40 MG tablet Take 1 tablet (40 mg total) by mouth at bedtime. 03/11/19   Ladene Artist, MD  furosemide (LASIX) 20 MG tablet Take 1-2 tablets (20-40 mg total) by mouth as needed for fluid or edema. 09/07/19   Leamon Arnt, MD  gabapentin (NEURONTIN) 100 MG capsule Take 1 capsule by mouth three times daily. 10/14/19   Leamon Arnt, MD  glucose blood (FREESTYLE LITE) test strip TEST UP TO FOUR TIMES DAILY Dx: E11.22 01/19/18   Briscoe Deutscher, DO  HYDROcodone-acetaminophen (NORCO) 10-325 MG tablet Take 1 tablet by mouth daily as needed. 09/16/19   Leamon Arnt, MD  Lancets (FREESTYLE) lancets TEST UP TO FOUR TIMES DAILY Dx: E11.22 04/08/16   Briscoe Deutscher, DO  Multiple Vitamins-Minerals (BARIATRIC MULTIVITAMINS/IRON) CAPS Take 1 tablet by mouth daily. Patient not taking: Reported on 09/01/2019 08/26/18   Briscoe Deutscher, DO  nitroGLYCERIN (NITROLINGUAL) 0.4 MG/SPRAY spray Place 1 spray under the tongue every 5 (five)  minutes x 3 doses as needed for chest pain. 10/23/18   Briscoe Deutscher, DO  pantoprazole (PROTONIX) 40 MG tablet Take 1 tablet by mouth twice daily before meals. 03/11/19   Ladene Artist, MD  potassium chloride (KLOR-CON) 10 MEQ tablet Take 1 tablet by mouth twice daily. 10/14/19   Leamon Arnt, MD  sertraline (ZOLOFT) 50 MG tablet Take 1 tablet (50 mg total) by mouth at bedtime. 03/07/19   Leamon Arnt, MD  valsartan (DIOVAN) 320 MG tablet Take 1 tablet (320 mg total) by mouth daily. 03/03/19   Leamon Arnt, MD    Allergies    Sulfa antibiotics  Review of Systems   Review of Systems  Gastrointestinal: Positive for diarrhea.  Neurological: Positive for syncope.  All other systems reviewed and are negative.   Physical Exam Updated Vital Signs BP (!) 122/59 (BP Location: Right Arm)   Pulse (!) 54   Temp 97.7 F (36.5 C) (Oral)   Resp 12   Ht 5\' 2"  (1.575 m)   Wt 106.6 kg   LMP  (LMP Unknown)   SpO2 97%   BMI 42.98 kg/m   Physical Exam Vitals and nursing note reviewed.  Constitutional:      Appearance: Normal appearance. She is obese.  HENT:     Head: Normocephalic and atraumatic.     Right Ear: External ear normal.     Left Ear: External ear normal.     Nose: Nose normal.     Mouth/Throat:     Mouth: Mucous membranes are dry.  Eyes:     Extraocular Movements: Extraocular movements intact.     Conjunctiva/sclera: Conjunctivae normal.     Pupils: Pupils are equal, round, and reactive to light.  Cardiovascular:     Rate and Rhythm: Regular rhythm. Bradycardia present.     Pulses: Normal pulses.     Heart sounds: Normal heart sounds.  Pulmonary:     Effort: Pulmonary effort is normal.     Breath sounds: Normal breath sounds.  Abdominal:  General: Abdomen is flat. Bowel sounds are normal.     Palpations: Abdomen is soft.  Musculoskeletal:        General: Normal range of motion.     Cervical back: Normal range of motion and neck supple.  Skin:    General:  Skin is warm.     Capillary Refill: Capillary refill takes less than 2 seconds.  Neurological:     General: No focal deficit present.     Mental Status: She is alert and oriented to person, place, and time.  Psychiatric:        Mood and Affect: Mood normal.        Behavior: Behavior normal.        Thought Content: Thought content normal.        Judgment: Judgment normal.     ED Results / Procedures / Treatments   Labs (all labs ordered are listed, but only abnormal results are displayed) Labs Reviewed  BASIC METABOLIC PANEL - Abnormal; Notable for the following components:      Result Value   Potassium 5.5 (*)    CO2 21 (*)    Glucose, Bld 142 (*)    BUN 71 (*)    Creatinine, Ser 2.42 (*)    GFR calc non Af Amer 19 (*)    GFR calc Af Amer 22 (*)    All other components within normal limits  URINALYSIS, ROUTINE W REFLEX MICROSCOPIC - Abnormal; Notable for the following components:   APPearance HAZY (*)    Leukocytes,Ua MODERATE (*)    Bacteria, UA MANY (*)    All other components within normal limits  CBG MONITORING, ED - Abnormal; Notable for the following components:   Glucose-Capillary 102 (*)    All other components within normal limits  URINE CULTURE  CULTURE, BLOOD (ROUTINE X 2)  CULTURE, BLOOD (ROUTINE X 2)  CBC  MAGNESIUM  TROPONIN I (HIGH SENSITIVITY)  TROPONIN I (HIGH SENSITIVITY)    EKG EKG Interpretation  Date/Time:  Thursday November 03 2019 15:06:51 EDT Ventricular Rate:  57 PR Interval:  142 QRS Duration: 82 QT Interval:  430 QTC Calculation: 418 R Axis:   12 Text Interpretation: Sinus bradycardia Low voltage QRS Cannot rule out Anterior infarct , age undetermined Abnormal ECG No significant change since last tracing Confirmed by Isla Pence [phone]) on 11/03/2019 5:23:24 PM   Radiology CT ABDOMEN PELVIS WO CONTRAST  Result Date: 11/03/2019 CLINICAL DATA:  Syncopal episode EXAM: CT ABDOMEN AND PELVIS WITHOUT CONTRAST TECHNIQUE:  Multidetector CT imaging of the abdomen and pelvis was performed following the standard protocol without IV contrast. COMPARISON:  CT 10/27/2017, 10/05/2015 FINDINGS: Lower chest: Stable punctate nodules in the subpleural right lower lobe consistent with benign finding. No acute airspace disease. Borderline cardiomegaly. Coronary vascular calcification. Hepatobiliary: No focal liver abnormality is seen. Status post cholecystectomy. No biliary dilatation. Pancreas: Fatty atrophy.  No inflammatory change Spleen: Normal in size without focal abnormality. Adrenals/Urinary Tract: Nodular thickening of the adrenal glands with small bilateral nodules, not significantly changed. Atrophic right kidney. 1 cm indeterminate exophytic lesion off the atrophic mid right kidney. No hydronephrosis. The bladder is normal except for small amount of air, correlate for recent instrumentation Stomach/Bowel: Status post gastric bypass. No bowel obstruction. No bowel wall thickening. Left colon diverticular disease without acute inflammatory change Vascular/Lymphatic: Nonaneurysmal aorta. Moderate to marked aortic atherosclerosis. No suspicious nodes. Reproductive: Status post hysterectomy. No adnexal masses. Other: Negative for free air or free fluid. Musculoskeletal: No acute  or significant osseous findings. IMPRESSION: 1. No CT evidence for acute intra-abdominal or pelvic abnormality. 2. Atrophic right kidney. 1 cm indeterminate exophytic lesion off the mid right kidney. Nonemergent renal CT or MRI could be considered if clinically feasible, otherwise nonemergent ultrasound could be attempted for further characterisation. 3. Status post gastric bypass without evidence for obstruction. Left colon diverticular disease without acute inflammatory change. Aortic Atherosclerosis (ICD10-I70.0). Electronically Signed   By: Donavan Foil M.D.   On: 11/03/2019 19:38   DG Chest 2 View  Result Date: 11/03/2019 CLINICAL DATA:  Weakness and mid  chest pain. EXAM: CHEST - 2 VIEW COMPARISON:  07/27/2018, CT 04/20/2019 FINDINGS: The cardiomediastinal contours are normal. Aortic atherosclerosis. Coronary stents are visualized. Mild peribronchial thickening. Pulmonary vasculature is normal. No consolidation, pleural effusion, or pneumothorax. No acute osseous abnormalities are seen. IMPRESSION: 1. Mild peribronchial thickening, may be related to prior smoking history, bronchitis or asthma. No localizing process. 2. Coronary artery stents.  Aortic Atherosclerosis (ICD10-I70.0). Electronically Signed   By: Keith Rake M.D.   On: 11/03/2019 15:54    Procedures Procedures (including critical care time)  Medications Ordered in ED Medications  sertraline (ZOLOFT) tablet 50 mg (50 mg Oral Given 11/03/19 2144)  sodium chloride 0.9 % bolus 1,000 mL (0 mLs Intravenous Stopped 11/03/19 2145)  cephALEXin (KEFLEX) capsule 500 mg (500 mg Oral Given 11/03/19 2143)  baclofen (LIORESAL) tablet 10 mg (10 mg Oral Given 11/03/19 2143)    ED Course  I have reviewed the triage vital signs and the nursing notes.  Pertinent labs & imaging results that were available during my care of the patient were reviewed by me and considered in my medical decision making (see chart for details).    MDM Rules/Calculators/A&P                          Pt is feeling much better after fluids.  She does have AKI likely from diarrhea and her UTI.  She does not have colitis on CT and has not had diarrhea while here.  She is no longer orthostatic and is able to walk to the bathroom without problems.  I offered pt admission b/c of the AKI, but she feels back to normal and wants to go home.  She is to f/u with her pcp.  She is told to hold her bp meds tonight and tomorrow.  She is to return if worse.  Blood and urine cultures are pending.  Final Clinical Impression(s) / ED Diagnoses Final diagnoses:  Syncope, unspecified syncope type  Orthostatic hypotension  Dehydration    Diarrhea, unspecified type  AKI (acute kidney injury) ([place])  Acute cystitis without hematuria    Rx / DC Orders ED Discharge Orders         Ordered    cephALEXin (KEFLEX) 500 MG capsule  3 times daily        11/03/19 2230           Isla Pence, MD 11/03/19 2234

## 2019-11-03 NOTE — ED Triage Notes (Signed)
Pt here from home with c/o syncopal episod e, pt did have her 3 rd covid shot  But episode was about 10 mins  Afterwards , pb/p was low at home

## 2019-11-04 ENCOUNTER — Telehealth: Payer: Self-pay

## 2019-11-04 NOTE — Telephone Encounter (Signed)
Noted  

## 2019-11-04 NOTE — Telephone Encounter (Signed)
Initial Comment Got her covid shot and collasped, her legs gave out from her. BP 99/53 Translation No Nurse Assessment Nurse: Vallery Sa, RN, Cathy Date/Time (Eastern Time): 11/03/2019 1:27:02 PM Confirm and document reason for call. If symptomatic, describe symptoms. ---Charlett Nose states that she had her COVID immunization about 10:15am and her legs gave out about 15 minutes ago. No impact to her head. She scrape her left elbow. Her blood pressure was 99/53 when she got home at 12:07pm. No severe breathing difficulty or blueness around her lips. Her SpO2 is 94. Alert and responsive. Does the patient have any new or worsening symptoms? ---Yes Will a triage be completed? ---Yes Related visit to physician within the last 2 weeks? ---No Does the PT have any chronic conditions? (i.e. diabetes, asthma, this includes High risk factors for pregnancy, etc.) ---Yes List chronic conditions. ---Diabetes (Blood Sugar145 about 11am), Sudden Cardiac Arrest 2017 Is this a behavioral health or substance abuse call? ---No Guidelines Guideline Title Affirmed Question Affirmed Notes Nurse Date/Time (Eastern Time) Blood Pressure - Low Started suddenly after an allergic medicine, an allergic food, or bee sting Vallery Sa, RN, Tye [place] 11/03/2019 1:31:51 PM Disp. Time Eilene Ghazi Time) Disposition Final User 11/03/2019 1:40:32 PM 911 Outcome Documentation Trumbull, RN, Tye [place] Reason: Mariadelaluz declined the Call 911 disposition. Reinforced the Call 911 PLEASE NOTE: All timestamps contained within this report are represented as [place] Standard Time. CONFIDENTIALTY NOTICE: This fax transmission is intended only for the addressee. It contains information that is legally privileged, confidential or otherwise protected from use or disclosure. If you are not the intended recipient, you are strictly prohibited from reviewing, disclosing, copying using or disseminating any of this information or taking any action in reliance on or  regarding this information. If you have received this fax in error, please notify us immediately by telephone so that we can arrange for its return to [place]. Phone: [phone], Toll-Free: [phone], Fax: [phone] Page: 2 of 2 Call Id: [id number] [place]. Time Eilene Ghazi Time) Disposition Final User disposition. Aarushi plans to call her son and have him take her to the ER. notified Benjie Karvonen via the office backline. 11/03/2019 1:40:45 PM Call Completed Vallery Sa RN, Tye [place] 11/03/2019 1:37:23 PM Call EMS 911 Now Yes Trumbull, RN, Rosey Bath Disagree/Comply Disagree Caller Understands Yes PreDisposition Call Doctor Care Advice Given Per Guideline CALL EMS 911 NOW: * Immediate medical attention is needed. You need to hang up and call 911 (or an ambulance). * Triager Discretion: I'll call you back in a few minutes to be sure you were able to reach them. CARE ADVICE given per Low Blood Pressure (Adult) guideline. Referrals GO TO FACILITY REFUSE

## 2019-11-04 NOTE — Telephone Encounter (Signed)
See below

## 2019-11-04 NOTE — Telephone Encounter (Signed)
Called patient back, scheduled for [name] on Monday but had patient triaged as she started talking about feeling weak, BP was still low and has had 2 falls since the hospital.

## 2019-11-04 NOTE — Telephone Encounter (Signed)
FYI, patient seen in ED yesterday

## 2019-11-04 NOTE — Telephone Encounter (Signed)
FYI, patient refused ER and urgent care

## 2019-11-04 NOTE — Telephone Encounter (Signed)
Tricia [name], She needs to be seen. I'm out next week so will need to schedule her with someone else here in the office or with another [facility] provider.  2 weeks is too far out.  Thanks, Dr. Jonni Sanger

## 2019-11-04 NOTE — Telephone Encounter (Signed)
Patient is calling in stating that she went to the ED, and was released earlier this morning and needs a follow up appointment within a few days. Judi says that she still feels weak, almost like her BP is still low - has been able to drink more fluids but is unable to use the bathroom. Next available for an open slot is 2 weeks out, okay to schedule or can patient be seen sooner?

## 2019-11-04 NOTE — Telephone Encounter (Signed)
Nurse Assessment Nurse: Alveta Heimlich, RN, Rise Paganini Date/Time (Eastern Time): 11/04/2019 2:08:17 PM Confirm and document reason for call. If symptomatic, describe symptoms. ---Caller states she is having some weakness and low blood pressure this am. Last night was 81/51 . She went to ER yesterday but she did not spend the night. At 7am 104/71. She is feeling weak and shaky. Blood sugar was 145 before breakfast and just ate breakfast 45 minutes ago. She also fell yesterday after getting her covid vaccine, she just collapsed. Does the patient have any new or worsening symptoms? ---Yes Will a triage be completed? ---Yes Related visit to physician within the last 2 weeks? ---Yes Does the PT have any chronic conditions? (i.e. diabetes, asthma, this includes High risk factors for pregnancy, etc.) ---Yes List chronic conditions. ---diabetes, HTN, depression Is this a behavioral health or substance abuse call? ---No Guidelines Guideline Title Affirmed Question Affirmed Notes Nurse Date/Time (Eastern Time) Weakness (Generalized) and Fatigue [1] MODERATE weakness (i.e., interferes with work, school, normal activities) AND [2] cause unknown (Exceptions: weakness with acute minor illness, or weakness from poor fluid intake) Alveta Heimlich, RN, Rise Paganini 11/04/2019 2:13:53 PMPLEASE NOTE: All timestamps contained within this report are represented as [place] Standard Time. CONFIDENTIALTY NOTICE: This fax transmission is intended only for the addressee. It contains information that is legally privileged, confidential or otherwise protected from use or disclosure. If you are not the intended recipient, you are strictly prohibited from reviewing, disclosing, copying using or disseminating any of this information or taking any action in reliance on or regarding this information. If you have received this fax in error, please notify us immediately by telephone so that we can arrange for its return to [place]. Phone:  [phone], Toll-Free: [phone], Fax: [phone] Page: 2 of 2 Call Id: [id number] [place]. Time Eilene Ghazi Time) Disposition Final User 11/04/2019 2:23:04 PM See HCP within 4 Hours (or PCP triage) Yes Alveta Heimlich, RN, Ali Lowe Disagree/Comply Disagree Caller Understands Yes PreDisposition Did not know what to do Care Advice Given Per Guideline SEE HCP (OR PCP TRIAGE) WITHIN 4 HOURS: CALL BACK IF: * You become worse CARE ADVICE given per Weakness and Fatigue (Adult) guideline. Comments User: Debby Bud, RN Date/Time Eilene Ghazi Time): 11/04/2019 2:27:34 PM She says there are no appointments, she has already called, they are the ones who gave her the nurse line. She is not wanting to go to urgent care or ER. Encouraged to go to be seen within 4 hours, and she still refused. Referrals GO TO FACILITY REFUSE

## 2019-11-05 LAB — URINE CULTURE: Culture: >=100000 — AB

## 2019-11-06 ENCOUNTER — Telehealth: Payer: Self-pay | Admitting: Emergency Medicine

## 2019-11-06 NOTE — Telephone Encounter (Signed)
Post ED Visit - Positive Culture Follow-up  Culture report reviewed by antimicrobial stewardship pharmacist: [facility] Team []  Elenor Quinones, Pharm.D. []  Heide Guile, Pharm.D., BCPS AQ-ID []  Parks Neptune, Pharm.D., BCPS []  Alycia Rossetti, Pharm.D., BCPS []  [place], Pharm.D., BCPS, AAHIVP []  Legrand Como, Pharm.D., BCPS, AAHIVP []  Salome Arnt, PharmD, BCPS []  Johnnette Gourd, PharmD, BCPS []  Hughes Better, PharmD, BCPS []  Leeroy Cha, PharmD []  Laqueta Linden, PharmD, BCPS [x]  Albertina Parr, PharmD  [place] Team []  Leodis Sias, PharmD []  Lindell Spar, PharmD []  Royetta Asal, PharmD []  Graylin Shiver, Rph []  Rema Fendt) Glennon Mac, PharmD []  Arlyn Dunning, PharmD []  Netta Cedars, PharmD []  Dia Sitter, PharmD []  Leone Haven, PharmD []  Gretta Arab, PharmD []  Theodis Shove, PharmD []  Peggyann Juba, PharmD []  Reuel Boom, PharmD   Positive urine culture Treated with Cephalexin, organism sensitive to the same and no further patient follow-up is required at this time.  Sandi Raveling Ayssa [place]gna 11/06/2019, 4:26 PM

## 2019-11-07 ENCOUNTER — Ambulatory Visit (INDEPENDENT_AMBULATORY_CARE_PROVIDER_SITE_OTHER): Payer: PPO | Admitting: Physician Assistant

## 2019-11-07 ENCOUNTER — Other Ambulatory Visit: Payer: Self-pay

## 2019-11-07 ENCOUNTER — Encounter: Payer: Self-pay | Admitting: Physician Assistant

## 2019-11-07 VITALS — BP 126/62 | HR 53 | Temp 97.2°F | Ht 62.0 in | Wt 238.4 lb

## 2019-11-07 DIAGNOSIS — I9589 Other hypotension: Secondary | ICD-10-CM | POA: Diagnosis not present

## 2019-11-07 DIAGNOSIS — N179 Acute kidney failure, unspecified: Secondary | ICD-10-CM | POA: Diagnosis not present

## 2019-11-07 DIAGNOSIS — W19XXXA Unspecified fall, initial encounter: Secondary | ICD-10-CM | POA: Diagnosis not present

## 2019-11-07 DIAGNOSIS — N3000 Acute cystitis without hematuria: Secondary | ICD-10-CM

## 2019-11-07 DIAGNOSIS — E861 Hypovolemia: Secondary | ICD-10-CM | POA: Diagnosis not present

## 2019-11-07 MED ORDER — CEPHALEXIN 500 MG PO CAPS: 500.0000 mg | ORAL_CAPSULE | Freq: Two times a day (BID) | ORAL | 0 refills | Status: AC

## 2019-11-07 NOTE — Progress Notes (Signed)
Tricia [name] is a 74 y.o. female is here for follow up from ED visit.  I acted as a Education administrator for [employer], PA-C Anselmo Pickler, LPN   History of Present Illness:   Chief Complaint  Patient presents with  . Hypotension  . Fall    HPI   Hypotension Pt was seen at the ED on Thursday 9/30. Pt got her [facility] shot at [facility] and passed out afterwards about 15 minutes.  Pt was supposed to start on antibiotics for UTI but pharmacy never got them.  Pt was off blood pressure medicine for 3 days. Pt took blood pressure yesterday it was 152/80 so she took her medicine. This morning was 136/67. Denies headaches, dizziness or blurred vision. Pt is having lower leg swelling which is not uncommon for her. Denies: calf pain, nausea, vomiting, pelvic pain/pressure, chest pain. She had diarrhea for 4 days prior to her ER visit. This has resolved.  Wt Readings from Last 5 Encounters:  11/07/19 238 lb 6.1 oz (108.1 kg)  11/03/19 235 lb (106.6 kg)  09/06/19 240 lb (108.9 kg)  05/17/19 241 lb 12.8 oz (109.7 kg)  04/15/19 241 lb 9.6 oz (109.6 kg)   She was offered to stay at the hospital for observation for her acute kidney injury but she declined.  She states that the ER provider told her to drink 3 L daily, she reports that she has been doing this. Friday, Saturday and Sunday drank 3 liters.   Fall Pt fell yesterday at home off of a step ladder.  She fell backwards onto a chair.  She has a bruise on her mid right back. Pt says she hit her head but denies headaches, dizziness or blurred vision, confusion.  There are no preventive care reminders to display for this patient.  Past Medical History:  Diagnosis Date  . Adenomatous polyp of colon 02/2004  . Anemia   . Anxiety   . Arthritis   . B12 deficiency   . CAD (coronary artery disease)    a. s/p remote BMS to LAD;  b. 09/2015 Inf STEMI/VF Arrest: LM nl, LAD 85p (staged PCI 2 days later w/ 3.0x32 Synergy DES), 40p/m, 25d, LCX 100m, RCA  80ost/100p (2.75x32 Synergy DES), 75m, EF 55-65%. // c. Myoview 2/18: EF 59, no ischemia or infarction; Normal study  . Dermatophytosis of groin and perianal area   . Diet Controlled Diabetes Mellitus   . Diverticulosis   . GERD (gastroesophageal reflux disease)   . H/O echocardiogram    a. 09/2015 Echo: EF 60-65%, no rwma, mild AI, mildly dil RA, mild to mod TR.  Marland Kitchen Hyperlipidemia   . Hypertensive heart disease   . Low back pain    l5 disc  . Lumbar spinal stenosis 03/03/2019  . Morbid obesity ([place])   . Osteoporosis   . PVC's (premature ventricular contractions) 04/16/2017   Holter 3/19: NSR, average heart rate 69, frequent PVCs (5% total beats), rare supraventricular ectopics, no AF/flutter  . Sleep apnea 10/03/2009   Resolved after gastric bypass    . TOBACCO ABUSE 10/05/2009   Qualifier: Diagnosis of  By: Stanford Breed, MD, Kandyce Rud   . Ventricular fibrillation ([place]) 10/01/2015   a. In setting of inferior STEMI.     Social History   Tobacco Use  . Smoking status: Former Smoker    Packs/day: 2.00    Years: 41.00    Pack years: 82.00    Types: Cigarettes    Quit date:  03/06/2012    Years since quitting: 7.6  . Smokeless tobacco: Never Used  Vaping Use  . Vaping Use: Never used  Substance Use Topics  . Alcohol use: No    Alcohol/week: 0.0 standard drinks  . Drug use: No    Past Surgical History:  Procedure Laterality Date  . ABDOMINAL HYSTERECTOMY    . APPENDECTOMY    . BARIATRIC SURGERY    . CARDIAC CATHETERIZATION N/A 10/01/2015   Procedure: Left Heart Cath and Coronary Angiography;  Surgeon: Leonie Man, MD;  Location: [facility];  Service: Cardiovascular;  Laterality: N/A;  . CARDIAC CATHETERIZATION N/A 10/01/2015   Procedure: Coronary Stent Intervention;  Surgeon: Leonie Man, MD;  Location: [place] [facility];  Service: Cardiovascular;  Laterality: N/A;  . CARDIAC CATHETERIZATION N/A 10/03/2015   Procedure: Coronary Stent Intervention;  Surgeon:  Troy Sine, MD;  Location: [place] [facility];  Service: Cardiovascular;  Laterality: N/A;  . CARDIAC CATHETERIZATION N/A 10/03/2015   Procedure: Coronary/Graft Angiography;  Surgeon: Troy Sine, MD;  Location: [facility] [facility];  Service: Cardiovascular;  Laterality: N/A;  . CARPAL TUNNEL RELEASE Bilateral   . CERVICAL DISC SURGERY    . CHOLECYSTECTOMY    . CORONARY ANGIOPLASTY  2002  . HERNIA REPAIR    . LUMBAR LAMINECTOMY     L3-4  . NEPHRECTOMY Right    Partial  . TONSILLECTOMY    . TOTAL KNEE ARTHROPLASTY Left   . TUBAL LIGATION    . ULNAR NERVE REPAIR Left     Family History  Problem Relation Age of Onset  . Colitis Mother        sepsis from c dif colitis  . Heart disease Father   . Lung cancer Maternal Uncle   . Stomach cancer Maternal Aunt   . Lung cancer Maternal Uncle   . Colon cancer Neg Hx     PMHx, SurgHx, SocialHx, FamHx, Medications, and Allergies were reviewed in the Visit Navigator and updated as appropriate.   Patient Active Problem List   Diagnosis Date Noted  . History of partial nephrectomy 09/08/2019  . Laryngopharyngeal reflux (LPR) 09/06/2019  . Diet-controlled diabetes mellitus ([place]) 03/04/2019  . Major depression, recurrent, chronic ([place]) 03/04/2019  . Mixed hyperlipidemia 03/04/2019  . Lumbar spinal stenosis 03/03/2019  . Bilateral lower extremity edema 11/21/2018  . Class 3 obesity with alveolar hypoventilation, serious comorbidity, and body mass index (BMI) of 45.0 to 49.9 in adult ([place]) 11/21/2018  . PVC's (premature ventricular contractions) 04/16/2017  . Hyperparathyroidism, secondary ([place]) 07/06/2016  . COPD (chronic obstructive pulmonary disease) ([place]) 04/19/2016  . Coronary artery disease involving native coronary artery of native heart with angina pectoris ([place]) 03/10/2016  . History of cardiac arrest 10/01/2015  . History of ST elevation myocardial infarction (STEMI) 10/01/2015  . GERD (gastroesophageal reflux disease), Rx  Protonix 02/14/2014  . Status post gastric bypass for obesity 02/14/2014  . Former smoker, 50 pack years, quit 2010 02/14/2014  . Anemia, B12 deficiency 04/04/2009  . Osteoporosis 12/14/2006  . Hyperlipidemia associated with type 2 diabetes mellitus ([place]), LDL goal < 70, Rx Lipitor 09/22/2006  . Hypertension associated with diabetes ([place]) 09/22/2006  . Osteoarthritis, multiple sites 09/22/2006  . 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 Adenomatous polyp of colon 02/04/2004    Social History   Tobacco Use  . Smoking status: Former Smoker    Packs/day: 2.00    Years: 41.00    Pack  years: 82.00    Types: Cigarettes    Quit date: 03/06/2012    Years since quitting: 7.6  . Smokeless tobacco: Never Used  Vaping Use  . Vaping Use: Never used  Substance Use Topics  . Alcohol use: No    Alcohol/week: 0.0 standard drinks  . Drug use: No    Current Medications and Allergies:    Current Outpatient Medications:  .  albuterol (PROVENTIL HFA;VENTOLIN HFA) 108 (90 Base) MCG/ACT inhaler, Inhale 2 puffs into the lungs every 6 (six) hours as needed for wheezing or shortness of breath., Disp: 3 Inhaler, Rfl: 0 .  amLODipine (NORVASC) 5 MG tablet, Take 1 tablet (5 mg total) by mouth daily., Disp: 90 tablet, Rfl: 3 .  aspirin EC 81 MG tablet, Take 81 mg by mouth daily., Disp: , Rfl:  .  atorvastatin (LIPITOR) 80 MG tablet, Take 1 tablet by mouth daily., Disp: 90 tablet, Rfl: 3 .  baclofen (LIORESAL) 10 MG tablet, Take 1 tablet by mouth twice daily., Disp: 60 tablet, Rfl: 4 .  carvedilol (COREG) 3.125 MG tablet, Take 1 tablet (3.125 mg total) by mouth 2 (two) times daily with a meal., Disp: 180 tablet, Rfl: 3 .  carvedilol (COREG) 6.25 MG tablet, Take 1 tablet (6.25 mg total) by mouth 2 (two) times daily with a meal., Disp: 180 tablet, Rfl: 3 .  Cholecalciferol (VITAMIN D) 50 MCG (2000 UT) tablet, Take 1 tablet (2,000 Units total) by mouth daily., Disp: 90 tablet, Rfl: 3 .   clopidogrel (PLAVIX) 75 MG tablet, Take 1 tablet by mouth daily., Disp: 90 tablet, Rfl: 1 .  ezetimibe (ZETIA) 10 MG tablet, Take 1 tablet (10 mg total) by mouth daily., Disp: 90 tablet, Rfl: 3 .  famotidine (PEPCID) 40 MG tablet, Take 1 tablet (40 mg total) by mouth at bedtime., Disp: 30 tablet, Rfl: 11 .  furosemide (LASIX) 20 MG tablet, Take 1-2 tablets (20-40 mg total) by mouth as needed for fluid or edema., Disp: 120 tablet, Rfl: 3 .  gabapentin (NEURONTIN) 100 MG capsule, Take 1 capsule by mouth three times daily., Disp: 270 capsule, Rfl: 0 .  glucose blood (FREESTYLE LITE) test strip, TEST UP TO FOUR TIMES DAILY Dx: E11.22, Disp: 360 each, Rfl: 5 .  HYDROcodone-acetaminophen (NORCO) 10-325 MG tablet, Take 1 tablet by mouth daily as needed., Disp: 30 tablet, Rfl: 0 .  Lancets (FREESTYLE) lancets, TEST UP TO FOUR TIMES DAILY Dx: E11.22, Disp: 360 each, Rfl: 5 .  nitroGLYCERIN (NITROLINGUAL) 0.4 MG/SPRAY spray, Place 1 spray under the tongue every 5 (five) minutes x 3 doses as needed for chest pain., Disp: 4.9 g, Rfl: 3 .  pantoprazole (PROTONIX) 40 MG tablet, Take 1 tablet by mouth twice daily before meals., Disp: 60 tablet, Rfl: 11 .  potassium chloride (KLOR-CON) 10 MEQ tablet, Take 1 tablet by mouth twice daily., Disp: 90 tablet, Rfl: 0 .  sertraline (ZOLOFT) 50 MG tablet, Take 1 tablet (50 mg total) by mouth at bedtime., Disp: 90 tablet, Rfl: 3 .  valsartan (DIOVAN) 320 MG tablet, Take 1 tablet (320 mg total) by mouth daily., Disp: 90 tablet, Rfl: 1 .  cephALEXin (KEFLEX) 500 MG capsule, Take 1 capsule (500 mg total) by mouth 2 (two) times daily for 7 days., Disp: 14 capsule, Rfl: 0 .  Multiple Vitamins-Minerals (BARIATRIC MULTIVITAMINS/IRON) CAPS, Take 1 tablet by mouth daily. (Patient not taking: Reported on 09/01/2019), Disp: 90 capsule, Rfl: 3   Allergies  Allergen Reactions  . Sulfa Antibiotics Diarrhea  and Nausea And Vomiting    Review of Systems   ROS  Negative unless  otherwise specified per HPI.  Vitals:   Vitals:   11/07/19 1055  BP: 126/62  Pulse: (!) 53  Temp: (!) 97.2 F (36.2 C)  TempSrc: Temporal  SpO2: 97%  Weight: 238 lb 6.1 oz (108.1 kg)  Height: 5\' 2"  (1.575 m)     Body mass index is 43.6 kg/m.   Physical Exam:    Physical Exam Vitals and nursing note reviewed.  Constitutional:      General: She is not in acute distress.    Appearance: She is well-developed. She is not ill-appearing or toxic-appearing.  Cardiovascular:     Rate and Rhythm: Normal rate and regular rhythm.     Pulses: Normal pulses.     Heart sounds: Normal heart sounds, S1 normal and S2 normal.  Pulmonary:     Effort: Pulmonary effort is normal.     Breath sounds: Normal breath sounds.  Abdominal:     Tenderness: There is no right CVA tenderness or left CVA tenderness.  Musculoskeletal:     Right lower leg: 1+ Edema present.     Left lower leg: 1+ Edema present.     Comments: No tenderness or swelling to bilateral calves Negative Homans' sign bilaterally  Skin:    General: Skin is warm and dry.     Comments: Mild area of ecchymosis to mid upper back  Neurological:     General: No focal deficit present.     Mental Status: She is alert.     GCS: GCS eye subscore is 4. GCS verbal subscore is 5. GCS motor subscore is 6.     Cranial Nerves: Cranial nerves are intact.     Sensory: Sensation is intact.     Motor: Motor function is intact.     Coordination: Coordination is intact.  Psychiatric:        Speech: Speech normal.        Behavior: Behavior normal. Behavior is cooperative.      Assessment and Plan:    Azaryah was seen today for hypotension and fall.  Diagnoses and all orders for this visit:  AKI (acute kidney injury) ([place]) No red flags on exam, thankfully diarrhea has stopped.   Repeat labs today. Continue to hold Lasix. Continue to push fluids. -     Comprehensive metabolic panel; Future -     Comprehensive metabolic panel  Acute  cystitis without hematuria Reviewed her urine culture results from the ER. Start Keflex 500 mg twice daily x7 days. Worsening precautions advised.  Fall, initial encounter Patient is neurologically intact.  No evidence of excessive bruising or any bleeding on exam today.  Hypotension due to hypovolemia She has not taken her medications today and her blood pressure is normal.  I recommend that she continue to hold her blood pressure medications.  She does have a follow-up with her cardiologist scheduled in about a week.  I recommend that she continue to check her blood pressures at home and if her numbers are greater than 150/90 on a regular basis to let our office or cardiology know.  I recommended that she continue to hold her Lasix until we get her kidney function panel back.  Other orders -     cephALEXin (KEFLEX) 500 MG capsule; Take 1 capsule (500 mg total) by mouth 2 (two) times daily for 7 days.  CMA or LPN served as scribe during this visit. History, Physical,  and Plan performed by medical provider. The above documentation has been reviewed and is accurate and complete.   Inda Coke, PA-C [place] [place] [facility] 11/07/2019  Follow-up: No follow-ups on file.

## 2019-11-07 NOTE — Patient Instructions (Signed)
It was great to see you!  Start Keflex 500 mg twice daily.  Continue to hold all blood pressure meds. If your numbers are consistently >150/90, please reach out to [place] or your cardiologist to advise how to resume.  Due to your kidney function, HOLD you lasix until I get your repeat labs back.  Take care,  Inda Coke PA-C

## 2019-11-08 ENCOUNTER — Telehealth: Payer: Self-pay

## 2019-11-08 ENCOUNTER — Other Ambulatory Visit: Payer: Self-pay

## 2019-11-08 ENCOUNTER — Other Ambulatory Visit: Payer: Self-pay | Admitting: Physician Assistant

## 2019-11-08 DIAGNOSIS — E875 Hyperkalemia: Secondary | ICD-10-CM

## 2019-11-08 LAB — COMPREHENSIVE METABOLIC PANEL
AG Ratio: 1.5 (calc) (ref 1.0–2.5)
ALT: 12 U/L (ref 6–29)
AST: 15 U/L (ref 10–35)
Albumin: 4 g/dL (ref 3.6–5.1)
Alkaline phosphatase (APISO): 150 U/L (ref 37–153)
BUN/Creatinine Ratio: 27 (calc) — ABNORMAL HIGH (ref 6–22)
BUN: 48 mg/dL — ABNORMAL HIGH (ref 7–25)
CO2: 25 mmol/L (ref 20–32)
Calcium: 9.7 mg/dL (ref 8.6–10.4)
Chloride: 107 mmol/L (ref 98–110)
Creat: 1.77 mg/dL — ABNORMAL HIGH (ref 0.60–0.93)
Globulin: 2.6 g/dL (calc) (ref 1.9–3.7)
Glucose, Bld: 121 mg/dL — ABNORMAL HIGH (ref 65–99)
Potassium: 5.8 mmol/L — ABNORMAL HIGH (ref 3.5–5.3)
Sodium: 140 mmol/L (ref 135–146)
Total Bilirubin: 0.5 mg/dL (ref 0.2–1.2)
Total Protein: 6.6 g/dL (ref 6.1–8.1)

## 2019-11-08 LAB — CULTURE, BLOOD (ROUTINE X 2)
Culture: NO GROWTH
Culture: NO GROWTH

## 2019-11-08 NOTE — Telephone Encounter (Signed)
Returned pt call  

## 2019-11-08 NOTE — Telephone Encounter (Signed)
Patient is returning Keba's call about lab results. 

## 2019-11-11 ENCOUNTER — Other Ambulatory Visit: Payer: PPO

## 2019-11-11 ENCOUNTER — Other Ambulatory Visit: Payer: Self-pay

## 2019-11-11 DIAGNOSIS — E875 Hyperkalemia: Secondary | ICD-10-CM

## 2019-11-11 LAB — BASIC METABOLIC PANEL
BUN/Creatinine Ratio: 23 (calc) — ABNORMAL HIGH (ref 6–22)
BUN: 32 mg/dL — ABNORMAL HIGH (ref 7–25)
CO2: 25 mmol/L (ref 20–32)
Calcium: 9.6 mg/dL (ref 8.6–10.4)
Chloride: 102 mmol/L (ref 98–110)
Creat: 1.38 mg/dL — ABNORMAL HIGH (ref 0.60–0.93)
Glucose, Bld: 172 mg/dL — ABNORMAL HIGH (ref 65–99)
Potassium: 4.9 mmol/L (ref 3.5–5.3)
Sodium: 134 mmol/L — ABNORMAL LOW (ref 135–146)

## 2019-11-15 ENCOUNTER — Other Ambulatory Visit: Payer: Self-pay

## 2019-11-15 ENCOUNTER — Ambulatory Visit: Payer: PPO | Admitting: Physician Assistant

## 2019-11-15 ENCOUNTER — Encounter: Payer: Self-pay | Admitting: Physician Assistant

## 2019-11-15 VITALS — BP 132/72 | HR 49 | Ht 62.0 in | Wt 239.6 lb

## 2019-11-15 DIAGNOSIS — M7989 Other specified soft tissue disorders: Secondary | ICD-10-CM | POA: Diagnosis not present

## 2019-11-15 DIAGNOSIS — I10 Essential (primary) hypertension: Secondary | ICD-10-CM | POA: Diagnosis not present

## 2019-11-15 DIAGNOSIS — N179 Acute kidney failure, unspecified: Secondary | ICD-10-CM

## 2019-11-15 DIAGNOSIS — I2510 Atherosclerotic heart disease of native coronary artery without angina pectoris: Secondary | ICD-10-CM

## 2019-11-15 DIAGNOSIS — E782 Mixed hyperlipidemia: Secondary | ICD-10-CM | POA: Diagnosis not present

## 2019-11-15 NOTE — Patient Instructions (Signed)
Medication Instructions:  Your physician has recommended you make the following change in your medication:   1) Decrease Coreg to 6.25 mg, 1 tablet by mouth twice a day 2) Take Furosemide 40 mg once a day for 3 days, then resume normal dose  *If you need a refill on your cardiac medications before your next appointment, please call your pharmacy*  Lab Work: None ordered today  Testing/Procedures: None ordered today  Follow-Up: On 02/15/2020 at 1:45PM with Richardson Dopp, PA-C

## 2019-11-15 NOTE — Progress Notes (Signed)
Cardiology Office Note:    Date:  11/15/2019   ID:  Tricia [name], DOB Jun 18, 1945, MRN [id number]  PCP:  Leamon Arnt, MD  [facility] Cardiologist:  Sherren Mocha, MD   [facility] Electrophysiologist:  None   Referring MD: Leamon Arnt, MD   Chief Complaint:  Follow-up (CAD)    Patient Profile:    Tricia [name] is a 74 y.o. female with:   Coronary artery disease ? S/p remote BMS to the LAD ? S/p inferior STEMI 8/17; C/B VF arrest >> 6 CPR, defib  S/p DES to the RCA  S/p staged PCI with DES to the LAD 2/2 ISR ? Intolerant of Ticagrelor 2/2 dyspnea ? Myoview 2/18: Low risk ? Echo 8/17: EF 60-65, mild-moderate TR, mild AI  Hypertension  Diabetes mellitus  Hyperlipidemia  COPD  Morbid obesity  PVCs ? Holter 3/19: 5% PVC burden  Prior CV studies: AAA [place] 04/30/17 IMPRESSION: Negative for abdominal aortic aneurysm.  24 Hr Holter 04/08/17 The basic rhythm is normal sinus with an average heart rate of 69 bpm There are no bradyarrhythmias There are frequent PVC's approximately 5% of total beats There are rare supraventricular ectopics No atrial fibrillation or flutter  Nuclear stress test 03/25/16 EF 59, no ischemia or scar, normal study  LHC 10/03/15 LAD prox and mid stent 85% ISR, dist stent ok with 25% ISR LCx prox 40%, dist 60% RCA ostial stent ok, prox stent ok, mid 65% and 20% PCI Angiosculpt scoring balloon and 3 x 32 mm Synergy DES to prox and mid LAD  Echo 10/02/15 EF 60-65%, normal wall motion, mild AI, mild RAE, mild to moderate TR  LHC 10/01/15 RCA ostial 80% and proximal 100%, mid 65% LCx prox 40% LAD prox stent 40% ISR, 85% ISR, dist stent ok with 25% ISR EF 55-65% PCI: 2.75 x 32 mm Synergy DES to ost/prox RCA  History of Present Illness:    Tricia [name] was last seen in 4/21.  She was seen in the emergency room 11/03/2019 with syncope.  This occurred shortly after getting her third dose of the COVID-19 vaccine.  Notes also  indicates she had recent UTI and diarrhea.  She had associated acute kidney injury with creatinine up to 2.42 and potassium 5.5 hemoglobin was normal at 12.1.  High-sensitivity troponin was negative.  Blood cultures were negative.  Urine culture demonstrated greater than 100,000 colonies of Klebsiella pneumonia.  She followed up with primary care 11/07/2019 and her creatinine was somewhat improved at 1.77.  Her blood pressure medicines were placed on hold due to hypotension.  She returns for follow-up.  She is here alone.  She has not had chest pain, recurrent syncope, orthopnea, paroxysmal nocturnal dyspnea.  She has chronic dyspnea on exertion that is unchanged.  Her leg swelling has gotten worse since her furosemide was stopped. She is now back on Furosemide 40 mg every MWF and 20 mg all other days.   Past Medical History:  Diagnosis Date  . Adenomatous polyp of colon 02/2004  . Anemia   . Anxiety   . Arthritis   . B12 deficiency   . CAD (coronary artery disease)    a. s/p remote BMS to LAD;  b. 09/2015 Inf STEMI/VF Arrest: LM nl, LAD 85p (staged PCI 2 days later w/ 3.0x32 Synergy DES), 40p/m, 25d, LCX 47m, RCA 80ost/100p (2.75x32 Synergy DES), 61m, EF 55-65%. // c. Myoview 2/18: EF 59, no ischemia or infarction; Normal study  . Dermatophytosis of  groin and perianal area   . Diet Controlled Diabetes Mellitus   . Diverticulosis   . GERD (gastroesophageal reflux disease)   . H/O echocardiogram    a. 09/2015 Echo: EF 60-65%, no rwma, mild AI, mildly dil RA, mild to mod TR.  Marland Kitchen Hyperlipidemia   . Hypertensive heart disease   . Low back pain    l5 disc  . Lumbar spinal stenosis 03/03/2019  . Morbid obesity ([place])   . Osteoporosis   . PVC's (premature ventricular contractions) 04/16/2017   Holter 3/19: NSR, average heart rate 69, frequent PVCs (5% total beats), rare supraventricular ectopics, no AF/flutter  . Sleep apnea 10/03/2009   Resolved after gastric bypass    . TOBACCO ABUSE 10/05/2009    Qualifier: Diagnosis of  By: Stanford Breed, MD, Kandyce Rud   . Ventricular fibrillation ([place]) 10/01/2015   a. In setting of inferior STEMI.    Current Medications: Current Meds  Medication Sig  . albuterol (PROVENTIL HFA;VENTOLIN HFA) 108 (90 Base) MCG/ACT inhaler Inhale 2 puffs into the lungs every 6 (six) hours as needed for wheezing or shortness of breath.  Marland Kitchen amLODipine (NORVASC) 5 MG tablet Take 1 tablet (5 mg total) by mouth daily.  Marland Kitchen aspirin EC 81 MG tablet Take 81 mg by mouth daily.  Marland Kitchen atorvastatin (LIPITOR) 80 MG tablet Take 1 tablet by mouth daily.  . baclofen (LIORESAL) 10 MG tablet Take 1 tablet by mouth twice daily.  . carvedilol (COREG) 6.25 MG tablet Take 1 tablet (6.25 mg total) by mouth 2 (two) times daily with a meal.  . Cholecalciferol (VITAMIN D) 50 MCG (2000 UT) tablet Take 1 tablet (2,000 Units total) by mouth daily.  . clopidogrel (PLAVIX) 75 MG tablet Take 1 tablet by mouth daily.  Marland Kitchen ezetimibe (ZETIA) 10 MG tablet Take 1 tablet (10 mg total) by mouth daily.  . famotidine (PEPCID) 40 MG tablet Take 1 tablet (40 mg total) by mouth at bedtime.  . furosemide (LASIX) 20 MG tablet Take 1-2 tablets (20-40 mg total) by mouth as needed for fluid or edema.  . gabapentin (NEURONTIN) 100 MG capsule Take 1 capsule by mouth three times daily.  Marland Kitchen glucose blood (FREESTYLE LITE) test strip TEST UP TO FOUR TIMES DAILY Dx: E11.22  . HYDROcodone-acetaminophen (NORCO) 10-325 MG tablet Take 1 tablet by mouth daily as needed.  . Lancets (FREESTYLE) lancets TEST UP TO FOUR TIMES DAILY Dx: E11.22  . Multiple Vitamins-Minerals (BARIATRIC MULTIVITAMINS/IRON) CAPS Take 1 tablet by mouth daily.  . nitroGLYCERIN (NITROLINGUAL) 0.4 MG/SPRAY spray Place 1 spray under the tongue every 5 (five) minutes x 3 doses as needed for chest pain.  . pantoprazole (PROTONIX) 40 MG tablet Take 1 tablet by mouth twice daily before meals.  . sertraline (ZOLOFT) 50 MG tablet Take 1 tablet (50 mg total) by mouth  at bedtime.  . valsartan (DIOVAN) 320 MG tablet Take 1 tablet (320 mg total) by mouth daily.  . [DISCONTINUED] carvedilol (COREG) 3.125 MG tablet Take 1 tablet (3.125 mg total) by mouth 2 (two) times daily with a meal.     Allergies:   Sulfa antibiotics   Social History   Tobacco Use  . Smoking status: Former Smoker    Packs/day: 2.00    Years: 41.00    Pack years: 82.00    Types: Cigarettes    Quit date: 03/06/2012    Years since quitting: 7.6  . Smokeless tobacco: Never Used  Vaping Use  . Vaping Use: Never used  Substance Use Topics  . Alcohol use: No    Alcohol/week: 0.0 standard drinks  . Drug use: No     Family Hx: The patient's family history includes Colitis in her mother; Heart disease in her father; Lung cancer in her maternal uncle and maternal uncle; Stomach cancer in her maternal aunt. There is no history of Colon cancer.  Review of Systems  Gastrointestinal: Negative for hematochezia.  Genitourinary: Negative for hematuria.     EKGs/Labs/Other Test Reviewed:    EKG:  EKG is  ordered today.  The ekg ordered today demonstrates sinus bradycardia, HR 49, normal axis, poor R wave progression, low voltage, QTC 410, no change from prior tracing  Recent Labs: 09/06/2019: TSH 4.05 11/03/2019: Hemoglobin 12.1; Platelets 299 11/07/2019: ALT 12 11/11/2019: BUN 32; Creat 1.38; Potassium 4.9; Sodium 134   Recent Lipid Panel Lab Results  Component Value Date/Time   CHOL 177 09/06/2019 10:58 AM   CHOL 125 03/05/2016 09:48 AM   TRIG 152 (H) 09/06/2019 10:58 AM   TRIG 122 12/11/2005 10:28 AM   HDL 45 (L) 09/06/2019 10:58 AM   HDL 45 03/05/2016 09:48 AM   CHOLHDL 3.9 09/06/2019 10:58 AM   LDLCALC 105 (H) 09/06/2019 10:58 AM   LDLDIRECT 86.0 09/11/2015 11:04 AM    Physical Exam:    VS:  BP 132/72   Pulse (!) 49   Ht 5\' 2"  (1.575 m)   Wt 239 lb 9.6 oz (108.7 kg)   LMP  (LMP Unknown)   SpO2 96%   BMI 43.82 kg/m     Wt Readings from Last 3 Encounters:  11/15/19  239 lb 9.6 oz (108.7 kg)  11/07/19 238 lb 6.1 oz (108.1 kg)  11/03/19 235 lb (106.6 kg)     Constitutional:      Appearance: Healthy appearance. Not in distress.  Neck:     Vascular: JVD normal.  Pulmonary:     Effort: Pulmonary effort is normal.     Breath sounds: No wheezing. No rales.  Cardiovascular:     Normal rate. Regular rhythm. Normal S1. Normal S2.     Murmurs: There is no murmur.  Edema:    Pretibial: bilateral trace edema of the pretibial area.    Ankle: bilateral 1+ edema of the ankle. Abdominal:     Palpations: Abdomen is soft.  Skin:    General: Skin is warm and dry.  Neurological:     Mental Status: Alert and oriented to person, place and time.     Cranial Nerves: Cranial nerves are intact.      ASSESSMENT & PLAN:    1. Coronary artery disease involving native coronary artery of native heart without angina pectoris History of inferior ST elevation myocardial infarction in August 2017 treated with drug-eluting stent to the RCA and staged PCI of the LAD with a drug-eluting stent. Nuclear stress test in February 2018 was low risk without ischemia and normal EF.  She is not currently having symptoms consistent with angina.  Continue aspirin, atorvastatin, carvedilol, clopidogrel, ezetimibe.  2. Essential hypertension Fair control.  She is back on all of her blood pressure medications.  She does have significant issues with leg swelling.  She has been on amlodipine, carvedilol and valsartan with really good blood pressure control.  Amlodipine could be contributing to some of her lower extremity swelling.  If we decided to decrease her amlodipine or stop it, we would likely need to add something like hydralazine to her medical regimen to keep her  blood pressure controlled.  Hopefully we can control her leg swelling with furosemide.  As noted below, I have reduced her carvedilol due to bradycardia.  We will keep a close eye on her blood pressure.  Follow-up in 3  months.  3. Mixed hyperlipidemia LDL was above goal in August.  Ezetimibe was added to her medical regimen.  Continue current dose of ezetimibe and atorvastatin.  She thinks her PCP will be checking her fasting lipid panel again in several months.    4. AKI (acute kidney injury) ([place]) Her creatinine has returned to baseline.  Most recent creatinine 11/11/2019 was 1.38.  5. Leg swelling Her leg swelling seems to be mainly related to venous insufficiency.  She has had good control with furosemide in the past.  I suspect fluid resuscitation for her AKI has contributed to her increased swelling.  I have recommended that she take furosemide 40 mg daily for 3 days and then resume her usual dose.  6. Bradycardia Heart rate is in the 40s today on electrocardiogram.  She is asymptomatic.  Reduce carvedilol to 6.25 mg twice daily.   Dispo:  Return in about 3 months (around 02/15/2020) for Routine Follow Up, w/ Dr. Burt Knack, or Richardson Dopp, PA-C, in person.   Medication Adjustments/Labs and Tests Ordered: Current medicines are reviewed at length with the patient today.  Concerns regarding medicines are outlined above.  Tests Ordered: Orders Placed This Encounter  Procedures  . EKG 12-Lead   Medication Changes: No orders of the defined types were placed in this encounter.   Signed, Richardson Dopp, PA-C  11/15/2019 4:57 PM    [facility] [place], [place], [place]  [address] Phone: [phone]; Fax: [phone]

## 2019-12-02 ENCOUNTER — Telehealth: Payer: Self-pay | Admitting: Family Medicine

## 2019-12-02 MED ORDER — ALBUTEROL SULFATE HFA 108 (90 BASE) MCG/ACT IN AERS: 2.0000 | INHALATION_SPRAY | Freq: Four times a day (QID) | RESPIRATORY_TRACT | 2 refills | Status: DC | PRN

## 2019-12-07 NOTE — Telephone Encounter (Signed)
[place] called with a question regarding this prescription. Please advise.

## 2019-12-08 NOTE — Telephone Encounter (Signed)
Script faxed to pill pack

## 2019-12-10 ENCOUNTER — Encounter: Payer: Self-pay | Admitting: Physician Assistant

## 2019-12-12 ENCOUNTER — Other Ambulatory Visit: Payer: Self-pay

## 2019-12-12 ENCOUNTER — Encounter: Payer: Self-pay | Admitting: Family Medicine

## 2019-12-12 ENCOUNTER — Ambulatory Visit (INDEPENDENT_AMBULATORY_CARE_PROVIDER_SITE_OTHER): Payer: PPO | Admitting: Family Medicine

## 2019-12-12 VITALS — BP 130/76 | HR 67 | Temp 97.6°F | Wt 235.6 lb

## 2019-12-12 DIAGNOSIS — R109 Unspecified abdominal pain: Secondary | ICD-10-CM

## 2019-12-12 DIAGNOSIS — R300 Dysuria: Secondary | ICD-10-CM

## 2019-12-12 DIAGNOSIS — N179 Acute kidney failure, unspecified: Secondary | ICD-10-CM | POA: Diagnosis not present

## 2019-12-12 LAB — POCT URINALYSIS DIPSTICK
Bilirubin, UA: NEGATIVE
Blood, UA: NEGATIVE
Glucose, UA: NEGATIVE
Ketones, UA: NEGATIVE
Leukocytes, UA: NEGATIVE
Nitrite, UA: NEGATIVE
Protein, UA: NEGATIVE
Spec Grav, UA: 1.01 (ref 1.010–1.025)
Urobilinogen, UA: 0.2 E.U./dL
pH, UA: 6 (ref 5.0–8.0)

## 2019-12-12 LAB — COMPLETE METABOLIC PANEL WITH GFR
AG Ratio: 1.7 (calc) (ref 1.0–2.5)
ALT: 8 U/L (ref 6–29)
AST: 15 U/L (ref 10–35)
Albumin: 4 g/dL (ref 3.6–5.1)
Alkaline phosphatase (APISO): 150 U/L (ref 37–153)
BUN/Creatinine Ratio: 30 (calc) — ABNORMAL HIGH (ref 6–22)
BUN: 40 mg/dL — ABNORMAL HIGH (ref 7–25)
CO2: 27 mmol/L (ref 20–32)
Calcium: 9.5 mg/dL (ref 8.6–10.4)
Chloride: 99 mmol/L (ref 98–110)
Creat: 1.35 mg/dL — ABNORMAL HIGH (ref 0.60–0.93)
GFR, Est African American: 45 mL/min/{1.73_m2} — ABNORMAL LOW (ref >=60)
GFR, Est Non African American: 39 mL/min/{1.73_m2} — ABNORMAL LOW (ref >=60)
Globulin: 2.3 g/dL (calc) (ref 1.9–3.7)
Glucose, Bld: 111 mg/dL — ABNORMAL HIGH (ref 65–99)
Potassium: 4.3 mmol/L (ref 3.5–5.3)
Sodium: 135 mmol/L (ref 135–146)
Total Bilirubin: 0.7 mg/dL (ref 0.2–1.2)
Total Protein: 6.3 g/dL (ref 6.1–8.1)

## 2019-12-12 NOTE — Progress Notes (Signed)
Subjective   CC:  Chief Complaint  Patient presents with  . Urinary Tract Infection    back pain started Friday evening     HPI: Tricia [name] is a 74 y.o. female who presents to the office today to address the problems listed above in the chief complaint. Patient reports 3 day h/o right flank pain: "same as the 3x before when I had a UTI". No injury. No dysuria, hematuria, lower abdominal pain, na/v but did just have diarrhea this am. No myalgias. No f/c/s.  Had klebsiella UTI 9/30, intermediate resistant to nitrofurantoin  AKI during recent hospitalization; due f/u. Has been improving.   Assessment  1. AKI (acute kidney injury) ([place])   2. Right flank pain      Plan   AKI: f/u with lab work.   Flank pain: ? Related to msk vs renal. Need urine but pt can't yet give sample. Await sample. May be early GE or other abdominal process with new onset diarrhea x 1: will monitor over next 48 hours. Return if worsening. Avoid dehydration; hold lasix. Drink fluids. immodium if needed.   Follow up: prn  Orders Placed This Encounter  Procedures  . Urine Culture  . COMPLETE METABOLIC PANEL WITH GFR   No orders of the defined types were placed in this encounter.     I reviewed the patients updated PMH, FH, and SocHx.    Patient Active Problem List   Diagnosis Date Noted  . Diet-controlled diabetes mellitus ([place]) 03/04/2019    Priority: High  . Major depression, recurrent, chronic ([place]) 03/04/2019    Priority: High  . Mixed hyperlipidemia 03/04/2019    Priority: High  . Lumbar spinal stenosis 03/03/2019    Priority: High  . Coronary artery disease involving native coronary artery of native heart with angina pectoris ([place]) 03/10/2016    Priority: High  . History of ST elevation myocardial infarction (STEMI) 10/01/2015    Priority: High  . Osteoporosis 12/14/2006    Priority: High  . Hyperlipidemia associated with type 2 diabetes mellitus ([place]), LDL goal < 70, Rx Lipitor  09/22/2006    Priority: High  . Hypertension associated with diabetes ([place]) 09/22/2006    Priority: High  . Adenomatous polyp of colon 02/04/2004    Priority: High  . Hyperparathyroidism, secondary ([place]) 07/06/2016    Priority: Medium  . COPD (chronic obstructive pulmonary disease) ([place]) 04/19/2016    Priority: Medium  . GERD (gastroesophageal reflux disease), Rx Protonix 02/14/2014    Priority: Medium  . Status post gastric bypass for obesity 02/14/2014    Priority: Medium  . Anemia, B12 deficiency 04/04/2009    Priority: Medium  . Osteoarthritis, multiple sites 09/22/2006    Priority: Medium  . Degenerative joint disease (DJD) of lumbar spine 09/22/2006    Priority: Medium  . Bilateral lower extremity edema 11/21/2018    Priority: Low  . PVC's (premature ventricular contractions) 04/16/2017    Priority: Low  . History of partial nephrectomy 09/08/2019  . Laryngopharyngeal reflux (LPR) 09/06/2019  . Class 3 obesity with alveolar hypoventilation, serious comorbidity, and body mass index (BMI) of 45.0 to 49.9 in adult ([place]) 11/21/2018  . History of cardiac arrest 10/01/2015  . Former smoker, 50 pack years, quit 2010 02/14/2014   Current Meds  Medication Sig  . albuterol (VENTOLIN HFA) 108 (90 Base) MCG/ACT inhaler Inhale 2 puffs into the lungs every 6 (six) hours as needed for wheezing or shortness of breath.  Marland Kitchen amLODipine (NORVASC) 5  MG tablet Take 1 tablet (5 mg total) by mouth daily.  Marland Kitchen aspirin EC 81 MG tablet Take 81 mg by mouth daily.  Marland Kitchen atorvastatin (LIPITOR) 80 MG tablet Take 1 tablet by mouth daily.  . baclofen (LIORESAL) 10 MG tablet Take 1 tablet by mouth twice daily.  . carvedilol (COREG) 6.25 MG tablet Take 1 tablet (6.25 mg total) by mouth 2 (two) times daily with a meal.  . Cholecalciferol (VITAMIN D) 50 MCG (2000 UT) tablet Take 1 tablet (2,000 Units total) by mouth daily.  . clopidogrel (PLAVIX) 75 MG tablet Take 1 tablet by mouth daily.  Marland Kitchen ezetimibe (ZETIA) 10  MG tablet Take 1 tablet (10 mg total) by mouth daily.  . famotidine (PEPCID) 40 MG tablet Take 1 tablet (40 mg total) by mouth at bedtime.  . furosemide (LASIX) 20 MG tablet Take 1-2 tablets (20-40 mg total) by mouth as needed for fluid or edema.  . gabapentin (NEURONTIN) 100 MG capsule Take 1 capsule by mouth three times daily.  Marland Kitchen glucose blood (FREESTYLE LITE) test strip TEST UP TO FOUR TIMES DAILY Dx: E11.22  . HYDROcodone-acetaminophen (NORCO) 10-325 MG tablet Take 1 tablet by mouth daily as needed.  . Lancets (FREESTYLE) lancets TEST UP TO FOUR TIMES DAILY Dx: E11.22  . Multiple Vitamins-Minerals (BARIATRIC MULTIVITAMINS/IRON) CAPS Take 1 tablet by mouth daily.  . nitroGLYCERIN (NITROLINGUAL) 0.4 MG/SPRAY spray Place 1 spray under the tongue every 5 (five) minutes x 3 doses as needed for chest pain.  . pantoprazole (PROTONIX) 40 MG tablet Take 1 tablet by mouth twice daily before meals.  . sertraline (ZOLOFT) 50 MG tablet Take 1 tablet (50 mg total) by mouth at bedtime.  . valsartan (DIOVAN) 320 MG tablet Take 1 tablet (320 mg total) by mouth daily.    Review of Systems: Cardiovascular: negative for chest pain Respiratory: negative for SOB or persistent cough Gastrointestinal: negative for abdominal pain Constitutional: Negative for fever malaise or anorexia  Objective  Vitals: BP 130/76   Pulse 67   Temp 97.6 F (36.4 C) (Temporal)   Wt 235 lb 9.6 oz (106.9 kg)   LMP  (LMP Unknown)   SpO2 97%   BMI 43.09 kg/m  General: no acute distress  Psych:  Alert and oriented, normal mood and affect Cardiovascular:  RRR without murmur or gallop. no peripheral edema Respiratory:  Good breath sounds bilaterally, CTAB with normal respiratory effort Gastrointestinal: soft, flat abdomen, normal active bowel sounds, no palpable masses, no hepatosplenomegaly, no appreciated hernias, NO CVAT, mild suprapubic ttp w/o rebound or guarding Back: no back ttp. Skin:  Warm, no rashes Neurologic:    Mental status is normal. normal gait No visits with results within 1 Day(s) from this visit.  Latest known visit with results is:  Lab on 11/11/2019  Component Date Value Ref Range Status  . Glucose, Bld 11/11/2019 172* 65 - 99 mg/dL Final  . BUN 11/11/2019 32* 7 - 25 mg/dL Final  . Creat 11/11/2019 1.38* 0.60 - 0.93 mg/dL Final  . BUN/Creatinine Ratio 11/11/2019 23* 6 - 22 (calc) Final  . Sodium 11/11/2019 134* 135 - 146 mmol/L Final  . Potassium 11/11/2019 4.9  3.5 - 5.3 mmol/L Final  . Chloride 11/11/2019 102  98 - 110 mmol/L Final  . CO2 11/11/2019 25  20 - 32 mmol/L Final  . Calcium 11/11/2019 9.6  8.6 - 10.4 mg/dL Final    Commons side effects, risks, benefits, and alternatives for medications and treatment plan prescribed today were  discussed, and the patient expressed understanding of the given instructions. Patient is instructed to call or message via MyChart if he/she has any questions or concerns regarding our treatment plan. No barriers to understanding were identified. We discussed Red Flag symptoms and signs in detail. Patient expressed understanding regarding what to do in case of urgent or emergency type symptoms.   Medication list was reconciled, printed and provided to the patient in AVS. Patient instructions and summary information was reviewed with the patient as documented in the AVS. This note was prepared with assistance of Dragon voice recognition software. Occasional wrong-word or sound-a-like substitutions may have occurred due to the inherent limitations of voice recognition software

## 2019-12-12 NOTE — Addendum Note (Signed)
Addended by: Doran Clay A on: 12/12/2019 03:16 PM   Modules accepted: Orders

## 2019-12-12 NOTE — Patient Instructions (Signed)
Please follow up if symptoms do not improve or as needed.   I will release your lab results to you on your MyChart account with further instructions. Please reply with any questions.   Once I get your urine test results, i'll let you know what to do.  Monitor your symptoms; your diarrhea may worsen. Drink fluids, immodium if needed and hold your lasix until it is better.

## 2019-12-13 ENCOUNTER — Other Ambulatory Visit: Payer: Self-pay

## 2019-12-13 MED ORDER — AMLODIPINE BESYLATE 10 MG PO TABS: 10.0000 mg | ORAL_TABLET | Freq: Every day | ORAL | 1 refills | Status: DC

## 2019-12-13 MED ORDER — CARVEDILOL 6.25 MG PO TABS: 6.2500 mg | ORAL_TABLET | Freq: Two times a day (BID) | ORAL | 1 refills | Status: DC

## 2019-12-13 NOTE — Telephone Encounter (Signed)
Please contact the pt for clarification. At last OV I decreased her Carvedilol form 9.375 to 6.25 mg twice daily b/c her HR was in the 40s. If she resumed the Carvedilol 9.375, reduce that back to 6.25 mg twice daily and increase the Amlodipine to 10 mg once daily. Monitor BP 1-2 times a day for 2 weeks and send those readings for review. Richardson Dopp, PA-C    12/13/2019 8:55 AM

## 2019-12-13 NOTE — Telephone Encounter (Signed)
Antonieta Iba, RN  You 42 minutes ago (12:47 PM)   Per Nicki Reaper she needs to go back to carvedilol 6.25 BID and increase amlodipine to 10 mg daily.  Monitor BP 1-2 times a day for 2 weeks and send those readings for review.  Thanks Karlene Einstein!!      Called the pt and went over recommendations per Richardson Dopp PA-C, for her to take the current dose of carvedilol 6.25 mg po bid, and increase her amlodipine to 10 mg po daily.  Advised the pt that per Nicki Reaper, after she starts this new regimen, he would like for her to monitor her BP/HR 1-2 times per day, record this, then send those recordings to him via mychart, in 2 weeks. Confirmed the pharmacy of choice with the pt. Pt verbalized understanding and agrees with this plan. Will route this message to Scott's covering CMA as an FYI, and to follow-up with the pt in 2 weeks, to receive recorded BP/HR findings.

## 2019-12-14 ENCOUNTER — Encounter: Payer: Self-pay | Admitting: Family Medicine

## 2019-12-14 LAB — URINE CULTURE
MICRO NUMBER:: 11173432
SPECIMEN QUALITY:: ADEQUATE

## 2019-12-14 MED ORDER — AMOXICILLIN-POT CLAVULANATE 875-125 MG PO TABS: 1.0000 | ORAL_TABLET | Freq: Two times a day (BID) | ORAL | 0 refills | Status: DC

## 2019-12-14 NOTE — Addendum Note (Signed)
Addended by: Billey Chang on: 12/14/2019 04:22 PM   Modules accepted: Orders

## 2019-12-14 NOTE — Progress Notes (Signed)
Please call patient: I have reviewed his/her lab results. Urine test does show another infection. I've ordered antibiotics. Please start today.

## 2019-12-15 ENCOUNTER — Other Ambulatory Visit: Payer: Self-pay

## 2019-12-15 DIAGNOSIS — R300 Dysuria: Secondary | ICD-10-CM

## 2019-12-15 NOTE — Telephone Encounter (Signed)
Yes please

## 2019-12-23 ENCOUNTER — Other Ambulatory Visit: Payer: PPO

## 2019-12-23 ENCOUNTER — Other Ambulatory Visit: Payer: Self-pay

## 2019-12-23 DIAGNOSIS — R300 Dysuria: Secondary | ICD-10-CM | POA: Diagnosis not present

## 2019-12-26 LAB — URINE CULTURE
MICRO NUMBER:: 11226480
SPECIMEN QUALITY:: ADEQUATE

## 2019-12-26 NOTE — Progress Notes (Signed)
Please call patient: I have reviewed his/her lab results. Urine culture shows 2 different bacteria.  This could be something called colonization which is different from infection; Is she having any symptoms?? If she feels fine,  then we do NOT need to treat. IF she is having symptoms, please list them and let me know. Then I can decide if treatment is needed.  Thanks

## 2020-01-03 ENCOUNTER — Other Ambulatory Visit: Payer: Self-pay

## 2020-01-03 DIAGNOSIS — R109 Unspecified abdominal pain: Secondary | ICD-10-CM

## 2020-01-12 ENCOUNTER — Other Ambulatory Visit: Payer: Self-pay | Admitting: Physician Assistant

## 2020-01-12 ENCOUNTER — Other Ambulatory Visit: Payer: Self-pay | Admitting: Family Medicine

## 2020-01-12 DIAGNOSIS — M5441 Lumbago with sciatica, right side: Secondary | ICD-10-CM

## 2020-01-12 DIAGNOSIS — G8929 Other chronic pain: Secondary | ICD-10-CM

## 2020-02-14 ENCOUNTER — Other Ambulatory Visit: Payer: Self-pay | Admitting: Gastroenterology

## 2020-02-14 ENCOUNTER — Other Ambulatory Visit: Payer: Self-pay | Admitting: Physician Assistant

## 2020-02-14 ENCOUNTER — Other Ambulatory Visit: Payer: Self-pay | Admitting: Family Medicine

## 2020-02-15 ENCOUNTER — Ambulatory Visit: Payer: PPO | Admitting: Physician Assistant

## 2020-03-09 ENCOUNTER — Encounter: Payer: Self-pay | Admitting: Family Medicine

## 2020-03-09 ENCOUNTER — Ambulatory Visit (INDEPENDENT_AMBULATORY_CARE_PROVIDER_SITE_OTHER): Payer: PPO | Admitting: Family Medicine

## 2020-03-09 ENCOUNTER — Other Ambulatory Visit: Payer: Self-pay

## 2020-03-09 VITALS — BP 136/74 | HR 73 | Temp 97.9°F | Resp 16 | Wt 239.6 lb

## 2020-03-09 DIAGNOSIS — R10A1 Flank pain, right side: Secondary | ICD-10-CM

## 2020-03-09 DIAGNOSIS — E119 Type 2 diabetes mellitus without complications: Secondary | ICD-10-CM | POA: Diagnosis not present

## 2020-03-09 DIAGNOSIS — M8949 Other hypertrophic osteoarthropathy, multiple sites: Secondary | ICD-10-CM

## 2020-03-09 DIAGNOSIS — M150 Primary generalized (osteo)arthritis: Secondary | ICD-10-CM

## 2020-03-09 DIAGNOSIS — E782 Mixed hyperlipidemia: Secondary | ICD-10-CM | POA: Diagnosis not present

## 2020-03-09 DIAGNOSIS — N183 Chronic kidney disease, stage 3 unspecified: Secondary | ICD-10-CM

## 2020-03-09 DIAGNOSIS — J449 Chronic obstructive pulmonary disease, unspecified: Secondary | ICD-10-CM

## 2020-03-09 DIAGNOSIS — R109 Unspecified abdominal pain: Secondary | ICD-10-CM | POA: Diagnosis not present

## 2020-03-09 DIAGNOSIS — R600 Localized edema: Secondary | ICD-10-CM

## 2020-03-09 DIAGNOSIS — N1831 Chronic kidney disease, stage 3a: Secondary | ICD-10-CM

## 2020-03-09 DIAGNOSIS — I25119 Atherosclerotic heart disease of native coronary artery with unspecified angina pectoris: Secondary | ICD-10-CM | POA: Diagnosis not present

## 2020-03-09 DIAGNOSIS — M159 Polyosteoarthritis, unspecified: Secondary | ICD-10-CM

## 2020-03-09 HISTORY — DX: Chronic kidney disease, stage 3 unspecified: N18.30

## 2020-03-09 LAB — COMPREHENSIVE METABOLIC PANEL
ALT: 11 U/L (ref 0–35)
AST: 14 U/L (ref 0–37)
Albumin: 3.8 g/dL (ref 3.5–5.2)
Alkaline Phosphatase: 165 U/L — ABNORMAL HIGH (ref 39–117)
BUN: 25 mg/dL — ABNORMAL HIGH (ref 6–23)
CO2: 31 mEq/L (ref 19–32)
Calcium: 9.6 mg/dL (ref 8.4–10.5)
Chloride: 104 mEq/L (ref 96–112)
Creatinine, Ser: 1.16 mg/dL (ref 0.40–1.20)
GFR: 47.1 mL/min — ABNORMAL LOW (ref >60.00)
Glucose, Bld: 140 mg/dL — ABNORMAL HIGH (ref 70–99)
Potassium: 4.4 mEq/L (ref 3.5–5.1)
Sodium: 140 mEq/L (ref 135–145)
Total Bilirubin: 0.5 mg/dL (ref 0.2–1.2)
Total Protein: 6.8 g/dL (ref 6.0–8.3)

## 2020-03-09 LAB — LIPID PANEL
Cholesterol: 142 mg/dL (ref 0–200)
HDL: 50.6 mg/dL (ref >39.00)
LDL Cholesterol: 62 mg/dL (ref 0–99)
NonHDL: 91.01
Total CHOL/HDL Ratio: 3
Triglycerides: 144 mg/dL (ref 0.0–149.0)
VLDL: 28.8 mg/dL (ref 0.0–40.0)

## 2020-03-09 LAB — POCT GLYCOSYLATED HEMOGLOBIN (HGB A1C): Hemoglobin A1C: 6.5 % — AB (ref 4.0–5.6)

## 2020-03-09 LAB — TSH: TSH: 5.53 u[IU]/mL — ABNORMAL HIGH (ref 0.35–4.50)

## 2020-03-09 MED ORDER — PROAIR HFA 108 (90 BASE) MCG/ACT IN AERS: 2.0000 | INHALATION_SPRAY | Freq: Four times a day (QID) | RESPIRATORY_TRACT | 3 refills | Status: DC | PRN

## 2020-03-09 NOTE — Progress Notes (Signed)
Subjective  CC:  Chief Complaint  Patient presents with  . Follow-up    HPI: Tricia [name] is a 75 y.o. female who presents to the office today for follow up of diabetes and problems listed above in the chief complaint.   Diabetes follow up: Her diabetic control is reported as Unchanged.  She admits to blurred vision itching eats too many sweets.  This rarely happens. She denies exertional CP or SOB or symptomatic hypoglycemia. She denies foot sores or paresthesias.   Hyperlipidemia and heart disease on statin.  Her most recent LDL was elevated above goal and Zetia was added to her statin.  She is taking both medications without adverse effects.  She reports good compliance.  History of UTIs: She associates her UTI symptoms as a pressure-like sensation over the right flank.  She reports she never has irritative bladder symptoms.  We are trying to figure out if she has colonization of the bladder.  She reports she had pressure pain that started again today.  No fevers or chills.  No gross hematuria.  She cannot give a urine sample today but will bring in 1 next week.  Chronic bilateral lower extremity edema: Doing well with taking double dose Lasix 3 times weekly.  She does have chronic back pain due to osteoarthritis but she reports that the pressure sensation of the right flank feels different from that.  No changes in her arthritic pain.  She reports that at times she has wheezing or tightness in the chest that is relieved with ProAir.  She does have COPD by imaging study.  No cough, productive cough or exertional dyspnea.  Review of systems negative for chest pain.  Wt Readings from Last 3 Encounters:  03/09/20 239 lb 9.6 oz (108.7 kg)  12/12/19 235 lb 9.6 oz (106.9 kg)  11/15/19 239 lb 9.6 oz (108.7 kg)    BP Readings from Last 3 Encounters:  03/09/20 136/74  12/12/19 130/76  11/15/19 132/72    Assessment  1. Diet-controlled diabetes mellitus ([place])   2. Mixed  hyperlipidemia   3. Coronary artery disease involving native coronary artery of native heart with angina pectoris (HCC)   4. Stage 3a chronic kidney disease ([place])   5. Right flank pain   6. Bilateral lower extremity edema   7. Primary osteoarthritis involving multiple joints   8. Chronic obstructive pulmonary disease, unspecified COPD type ([place])      Plan   Diabetes is currently very well controlled.  Diet controlled.  Continue diabetic diet.  Monitor weight.  She denies foot problems.  Screens and immunizations are up-to-date.  Hyperlipidemia: We will recheck nonfasting lipids today on Zetia and statin.  Goal LDL is less than 70 due to CAD and diabetes.  Heart disease is stable.  Monitoring with cardiology.  Chronic lower extremity edema, right heart failure improved with variable dosing of Lasix.  Continue alternating 20 and 40 daily.  Monitoring renal function.  Fortunately resolved acute kidney injury.  Has stage III chronic kidney disease likely from diabetes and hypertension.  She is status post partial nephrectomy.  Avoid NSAIDs and nephrotoxins.  Back pain and flank pain: Likely all musculoskeletal.  Will check urine culture again.  We will also want to check urine she does not have pressure sensation to see if she is colonized.  Patient understands and agrees.  COPD with occasional wheezing: ProAir ordered.  We will continue to monitor.  If frequent use needed, will recommend anticholinergic.  Follow  up: .  3 months to recheck urine and diabetes Orders Placed This Encounter  Procedures  . Urine Culture  . Comprehensive metabolic panel  . Lipid panel  . TSH  . Urinalysis, Routine w reflex microscopic  . POCT glycosylated hemoglobin (Hb A1C)   Meds ordered this encounter  Medications  . PROAIR HFA 108 (90 Base) MCG/ACT inhaler    Sig: Inhale 2 puffs into the lungs every 6 (six) hours as needed for wheezing or shortness of breath.    Dispense:  18 g    Refill:  3       Immunization History  Administered Date(s) Administered  . Fluad Quad(high Dose 65+) 10/07/2018, 10/18/2019  . Influenza Split 01/21/2011  . Influenza Whole 11/19/2007  . Influenza, High Dose Seasonal PF 10/16/2015, 11/12/2016, 11/07/2017  . Influenza-Unspecified 12/18/2013  . PFIZER(Purple Top)SARS-COV-2 Vaccination 03/17/2019, 04/11/2019, 11/03/2019  . Pneumococcal Conjugate-13 08/26/2013  . Pneumococcal Polysaccharide-23 02/04/2003, 06/08/2015  . Td 02/03/2005  . Zoster Recombinat (Shingrix) 02/18/2017, 07/19/2017    Diabetes Related Lab Review: Lab Results  Component Value Date   HGBA1C 6.5 (A) 03/09/2020   HGBA1C 6.3 (A) 09/06/2019   HGBA1C 6.3 (A) 04/15/2019    Lab Results  Component Value Date   MICROALBUR 1.4 09/06/2019   Lab Results  Component Value Date   CREATININE 1.35 (H) 12/12/2019   BUN 40 (H) 12/12/2019   NA 135 12/12/2019   K 4.3 12/12/2019   CL 99 12/12/2019   CO2 27 12/12/2019   Lab Results  Component Value Date   CHOL 177 09/06/2019   CHOL 143 08/10/2018   CHOL 162 07/03/2017   Lab Results  Component Value Date   HDL 45 (L) 09/06/2019   HDL 41.70 08/10/2018   HDL 48.60 07/03/2017   Lab Results  Component Value Date   LDLCALC 105 (H) 09/06/2019   LDLCALC 71 08/10/2018   LDLCALC 91 07/03/2017   Lab Results  Component Value Date   TRIG 152 (H) 09/06/2019   TRIG 155.0 (H) 08/10/2018   TRIG 115.0 07/03/2017   Lab Results  Component Value Date   CHOLHDL 3.9 09/06/2019   CHOLHDL 3 08/10/2018   CHOLHDL 3 07/03/2017   Lab Results  Component Value Date   LDLDIRECT 86.0 09/11/2015   LDLDIRECT 160.7 09/20/2012   LDLDIRECT 180.3 05/16/2011   The ASCVD Risk score (Goff DC Jr., et al., 2013) failed to calculate for the following reasons:   The patient has a prior MI or stroke diagnosis I have reviewed the [place] history. Patient Active Problem List   Diagnosis Date Noted  . Chronic kidney disease, stage 3, mod decreased GFR  (HCC) 03/09/2020    Priority: High  . History of partial nephrectomy 09/08/2019    Priority: High    Right due to recurrent infections; now with only one functional kidney on left; done in the 70s.   . Diet-controlled diabetes mellitus ([place]) 03/04/2019    Priority: High  . Major depression, recurrent, chronic ([place]) 03/04/2019    Priority: High  . Mixed hyperlipidemia 03/04/2019    Priority: High  . Lumbar spinal stenosis 03/03/2019    Priority: High  . Class 3 obesity with alveolar hypoventilation, serious comorbidity, and body mass index (BMI) of 45.0 to 49.9 in adult [facility]) 11/21/2018    Priority: High  . Coronary artery disease involving native coronary artery of native heart with angina pectoris ([place]) 03/10/2016    Priority: High  . History of cardiac arrest  10/01/2015    Priority: High  . History of ST elevation myocardial infarction (STEMI) 10/01/2015    Priority: High    Study Conclusions 10/02/15:  - Left ventricle: The cavity size was normal. Wall thickness was   normal. Systolic function was normal. The estimated ejection   fraction was in the range of 60% to 65%. Wall motion was normal;   there were no regional wall motion abnormalities. - Aortic valve: There was mild regurgitation. - Right atrium: The atrium was mildly dilated. - Tricuspid valve: There was mild-moderate regurgitation.   . Osteoporosis 12/14/2006    Priority: High    DEXA 2018: T = -2.5. started on prolia and f/u 2019 was improved T = -2.3 Dexa 2021: T = -2.3; will recheck 2 years; ca and vit D only for now.    . Hyperlipidemia associated with type 2 diabetes mellitus ([place]), LDL goal < 70, Rx Lipitor 09/22/2006    Priority: High  . Hypertension associated with diabetes ([place]) 09/22/2006    Priority: High  . Adenomatous polyp of colon 02/04/2004    Priority: High  . Laryngopharyngeal reflux (LPR) 09/06/2019    Priority: Medium  . Hyperparathyroidism, secondary ([place]) 07/06/2016    Priority: Medium   . COPD (chronic obstructive pulmonary disease) ([place]) 04/19/2016    Priority: Medium    identified on CT 04/17/16, former smoker; uses proair intermittently. Not on maintenance inhaler   . GERD (gastroesophageal reflux disease), Rx Protonix 02/14/2014    Priority: Medium  . Status post gastric bypass for obesity 02/14/2014    Priority: Medium  . Former smoker, 50 pack years, quit 2010 02/14/2014    Priority: Medium  . Anemia, B12 deficiency 04/04/2009    Priority: Medium  . Osteoarthritis, multiple sites 09/22/2006    Priority: Medium    Throughout back, knees, hands    . Degenerative joint disease (DJD) of lumbar spine 09/22/2006    Priority: Medium  . Bilateral lower extremity edema 11/21/2018    Priority: Low  . PVC's (premature ventricular contractions) 04/16/2017    Priority: Low    Holter 3/19: NSR, average heart rate 69, frequent PVCs (5% total beats), rare supraventricular ectopics, no AF/flutter     Social History: Patient  reports that she quit smoking about 8 years ago. Her smoking use included cigarettes. She has a 82.00 pack-year smoking history. She has never used smokeless tobacco. She reports that she does not drink alcohol and does not use drugs.  Review of Systems: Ophthalmic: negative for eye pain, loss of vision or double vision Cardiovascular: negative for chest pain Respiratory: negative for SOB or persistent cough Gastrointestinal: negative for abdominal pain Genitourinary: negative for dysuria or gross hematuria MSK: negative for foot lesions Neurologic: negative for weakness or gait disturbance  Objective  Vitals: BP 136/74   Pulse 73   Temp 97.9 F (36.6 C)   Resp 16   Wt 239 lb 9.6 oz (108.7 kg)   LMP  (LMP Unknown)   SpO2 92%   BMI 43.82 kg/m  General: well appearing, no acute distress  Psych:  Alert and oriented, normal mood and affect HEENT:  Normocephalic, atraumatic, moist mucous membranes, supple neck  Cardiovascular:  Nl S1 and  S2, RRR without murmur, gallop or rub.  Trace bilateral edema Respiratory:  Good breath sounds bilaterally, CTAB with normal effort, no rales Gastrointestinal: normal BS, soft, nontender, no flank CVA tenderness Skin:  Warm, no rashes Neurologic:   Mental status is normal. normal gait Foot  exam: no erythema, pallor, or cyanosis visible nl proprioception and sensation to monofilament testing bilaterally, +2 distal pulses bilaterally    Diabetic education: ongoing education regarding chronic disease management for diabetes was given today. We continue to reinforce the ABC's of diabetic management: A1c (<7 or 8 dependent upon patient), tight blood pressure control, and cholesterol management with goal LDL < 100 minimally. We discuss diet strategies, exercise recommendations, medication options and possible side effects. At each visit, we review recommended immunizations and preventive care recommendations for diabetics and stress that good diabetic control can prevent other problems. See below for this patient's data.    Commons side effects, risks, benefits, and alternatives for medications and treatment plan prescribed today were discussed, and the patient expressed understanding of the given instructions. Patient is instructed to call or message via MyChart if he/she has any questions or concerns regarding our treatment plan. No barriers to understanding were identified. We discussed Red Flag symptoms and signs in detail. Patient expressed understanding regarding what to do in case of urgent or emergency type symptoms.   Medication list was reconciled, printed and provided to the patient in AVS. Patient instructions and summary information was reviewed with the patient as documented in the AVS. This note was prepared with assistance of Dragon voice recognition software. Occasional wrong-word or sound-a-like substitutions may have occurred due to the inherent limitations of voice recognition  software  This visit occurred during the SARS-CoV-2 public health emergency.  Safety protocols were in place, including screening questions prior to the visit, additional usage of staff PPE, and extensive cleaning of exam room while observing appropriate contact time as indicated for disinfecting solutions.

## 2020-03-09 NOTE — Patient Instructions (Signed)
Please return in 3 months to recheck urine and diabetes and blood pressure.  Please drop off a urine sample on Monday morning. I have placed orders to have it checked.   I will release your lab results to you on your MyChart account with further instructions. Please reply with any questions.    If you have any questions or concerns, please don't hesitate to send me a message via MyChart or call the office at [phone]. Thank you for visiting with [place] today! It's our pleasure caring for you.

## 2020-03-16 ENCOUNTER — Other Ambulatory Visit: Payer: Self-pay | Admitting: Gastroenterology

## 2020-03-16 ENCOUNTER — Other Ambulatory Visit: Payer: Self-pay | Admitting: Cardiovascular Disease

## 2020-03-27 ENCOUNTER — Other Ambulatory Visit (HOSPITAL_BASED_OUTPATIENT_CLINIC_OR_DEPARTMENT_OTHER): Payer: Self-pay | Admitting: Family Medicine

## 2020-03-27 DIAGNOSIS — Z1231 Encounter for screening mammogram for malignant neoplasm of breast: Secondary | ICD-10-CM

## 2020-03-27 DIAGNOSIS — H43391 Other vitreous opacities, right eye: Secondary | ICD-10-CM | POA: Diagnosis not present

## 2020-04-04 ENCOUNTER — Other Ambulatory Visit: Payer: Self-pay | Admitting: Gastroenterology

## 2020-04-04 ENCOUNTER — Ambulatory Visit: Payer: PPO | Admitting: Gastroenterology

## 2020-04-04 ENCOUNTER — Encounter: Payer: Self-pay | Admitting: Gastroenterology

## 2020-04-04 ENCOUNTER — Telehealth: Payer: Self-pay

## 2020-04-04 VITALS — BP 130/70 | HR 68 | Ht 62.0 in | Wt 238.2 lb

## 2020-04-04 DIAGNOSIS — Z7902 Long term (current) use of antithrombotics/antiplatelets: Secondary | ICD-10-CM

## 2020-04-04 DIAGNOSIS — Z86010 Personal history of colonic polyps: Secondary | ICD-10-CM | POA: Diagnosis not present

## 2020-04-04 DIAGNOSIS — K219 Gastro-esophageal reflux disease without esophagitis: Secondary | ICD-10-CM

## 2020-04-04 MED ORDER — PLENVU 140 G PO SOLR: 1.0000 | Freq: Once | ORAL | 0 refills | Status: AC

## 2020-04-04 NOTE — Telephone Encounter (Signed)
   Primary Cardiologist: Sherren Mocha, MD  Chart reviewed as part of pre-operative protocol coverage. Because of Candice Tobey Rocha's past medical history and time since last visit, she will require a follow-up visit in order to better assess preoperative cardiovascular risk.  Pre-op covering staff: - Please add "pre-op clearance" to upcoming appointment on 04/11/20 with Richardson Dopp, PA-C so he is aware. - Please contact requesting surgeon's office via preferred method (i.e, phone, fax) to inform them of need for appointment prior to surgery.  If applicable, this message will also be routed to pharmacy pool and/or primary cardiologist for input on holding anticoagulant/antiplatelet agent as requested below so that this information is available to the clearing provider at time of patient's appointment.   Kathyrn Drown, NP  04/04/2020, 9:04 AM

## 2020-04-04 NOTE — Telephone Encounter (Signed)
[facility] Pre-operative Risk Assessment     Request for surgical clearance:     Endoscopy Procedure  What type of surgery is being performed?     EGD/Colon  When is this surgery scheduled?     06/21/20  What type of clearance is required ?   Pharmacy  Are there any medications that need to be held prior to surgery and how long? Plavix x 5 days  Practice name and name of physician performing surgery?      [facility]  What is your office phone and fax number?      Phone- [phone]  [phone]  Anesthesia type (None, local, MAC, general) ?       MAC

## 2020-04-04 NOTE — Progress Notes (Signed)
    History of Present Illness: This is a 75 year old female with a personal history of multiple adenomatous colon polyps due for surveillance colonoscopy. She is maintained on Plavix. Her reflux symptoms are frequent active at night and in the morning. Denies weight loss, abdominal pain, constipation, diarrhea, change in stool caliber, melena, hematochezia, nausea, vomiting, dysphagia, chest pain.  Colonoscopy March 2019 - Four 6 to 8 mm polyps in the transverse colon, removed with a cold snare. Resected and retrieved. - Moderate diverticulosis in the left colon. There was narrowing of the colon in association with the diverticular opening. There was no evidence of diverticular bleeding. - Small internal hemorrhoids. - The examination was otherwise normal on direct and retroflexion views.   Current Medications, Allergies, Past Medical History, Past Surgical History, Family History and Social History were reviewed in [employer] record.   Physical Exam: General: Well developed, well nourished, no acute distress Head: Normocephalic and atraumatic Eyes: Sclerae anicteric, EOMI Ears: Normal auditory acuity Mouth: Not examined, mask on during Covid-19 pandemic Lungs: Clear throughout to auscultation Heart: Regular rate and rhythm; no murmurs, rubs or bruits Abdomen: Soft, non tender and non distended. No masses, hepatosplenomegaly or hernias noted. Normal Bowel sounds Rectal: Deferred to colonoscopy.  Musculoskeletal: Symmetrical with no gross deformities  Pulses:  Normal pulses noted Extremities: No clubbing, cyanosis, edema or deformities noted Neurological: Alert oriented x 4, grossly nonfocal Psychological:  Alert and cooperative. Normal mood and affect   Assessment and Recommendations:  1. Personal history of multiple adenomatous colon polyps due for surveillance colonoscopy. The risks (including bleeding, perforation, infection, missed lesions, medication  reactions and possible hospitalization or surgery if complications occur), benefits, and alternatives to colonoscopy with possible biopsy and possible polypectomy were discussed with the patient and they consent to proceed.   2. GERD with LPR. Nocturnal and morning symptoms remain intermittently active. Continue pantoprazole 40 mg po bid and famotidine 40 mg po hs. Follow antireflux measures. Schedule EGD. The risks (including bleeding, perforation, infection, missed lesions, medication reactions and possible hospitalization or surgery if complications occur), benefits, and alternatives to endoscopy with possible biopsy and possible dilation were discussed with the patient and they consent to proceed.   3. Hold Plavix 5 days before procedure - will instruct when and how to resume after procedure. Low but real risk of cardiovascular event such as heart attack, stroke, embolism, thrombosis or ischemia/infarct of other organs off Plavix explained and need to seek urgent help if this occurs. The patient consents to proceed. Will communicate by phone or EMR with patient's prescribing provider to confirm that holding Plavix is reasonable in this case.   4. S/P gastric bypass. BMI=43.57.

## 2020-04-04 NOTE — Patient Instructions (Signed)
You have been scheduled for an endoscopy and colonoscopy. Please follow the written instructions given to you at your visit today. Please pick up your prep supplies at the pharmacy within the next 1-3 days. If you use inhalers (even only as needed), please bring them with you on the day of your procedure.  Thank you for choosing me and [facility].  Malcolm T. Stark, Jr., MD., FACG   

## 2020-04-04 NOTE — Telephone Encounter (Signed)
Pt has appt 04/11/20 with Richardson Dopp, PAC. Will add pre op clearance needed to appt notes.

## 2020-04-05 ENCOUNTER — Telehealth: Payer: Self-pay | Admitting: Gastroenterology

## 2020-04-05 NOTE — Telephone Encounter (Signed)
Returned patients call and gave her a Plenvu sample kit to pick up  Gave her new instructions

## 2020-04-05 NOTE — Telephone Encounter (Signed)
Inbound call from patient stating Plenvu is not covered by her insurance and is requesting alternate script be sent to [facility] instead.

## 2020-04-06 ENCOUNTER — Telehealth: Payer: Self-pay

## 2020-04-06 NOTE — Telephone Encounter (Signed)
Ok for referral?

## 2020-04-06 NOTE — Telephone Encounter (Signed)
Spoke to patient to clarify

## 2020-04-06 NOTE — Telephone Encounter (Signed)
Pt is requesting a referral be placed to [facility] for her stage 3 kidney disease.

## 2020-04-06 NOTE — Telephone Encounter (Signed)
Please let patient know that she has mild renal insufficiency.  It is not recommended that she see a nephrologist for this at this point.  We can discuss at her follow-up visits.  Nephrology will not yet see her.

## 2020-04-06 NOTE — Telephone Encounter (Signed)
Patient states that her MyChart says she has Stage 3 kidney disease, not mild renal insufficiency. Expressed concerns over PCP being contradictive with multiple chronic diagnoses in the past. States she may start looking for new PCP. Concerns over possibly ending up on dialysis in the next few years.

## 2020-04-10 NOTE — Progress Notes (Addendum)
Cardiology Office Note:    Date:  04/11/2020   ID:  Tricia [name], DOB Jun 28, 1945, MRN [id number]  PCP:  Tricia [name], [place]  Cardiologist:  Tricia Mocha, MD   Advanced Practice Provider:  Liliane Shi, PA-C Electrophysiologist:  None       Referring MD: Tricia Arnt, MD   Chief Complaint:  Follow-up (CAD, surgical clearance)    Patient Profile:    Tricia [name] is a 75 y.o. female with:   Coronary artery disease ? S/p remote BMS to the LAD ? S/p inferior STEMI 8/17; C/B VF arrest>>CPR, defib  S/p DES to the RCA  S/p staged PCI with DES to the LAD 2/2 ISR ? Intolerant of Ticagrelor 2/2 dyspnea ? Myoview 2/18: Low risk ? Echo 8/17: EF 60-65, mild-moderate TR, mild AI  Hypertension  Diabetes mellitus  Hyperlipidemia  COPD  Morbid obesity  S/p bariatric surgery   PVCs ? Holter 3/19: 5% PVC burden  Venous insufficiency   Prior CV studies: AAA [place] 04/30/17 IMPRESSION: Negative for abdominal aortic aneurysm.  24 Hr Holter 04/08/17 The basic rhythm is normal sinus with an average heart rate of 69 bpm There are no bradyarrhythmias There are frequent PVC's approximately 5% of total beats There are rare supraventricular ectopics No atrial fibrillation or flutter  Nuclear stress test 03/25/16 EF 59, no ischemia or scar, normal study  LHC 10/03/15 LAD prox and mid stent 85% ISR, dist stent ok with 25% ISR LCx prox 40%, dist 60% RCA ostial stent ok, prox stent ok, mid 65% and 20% PCI Angiosculpt scoring balloon and 3 x 32 mm Synergy DES to prox and mid LAD  Echo 10/02/15 EF 60-65%, normal wall motion, mild AI, mild RAE, mild to moderate TR  LHC 10/01/15 RCA ostial 80% and proximal 100%, mid 65% LCx prox 40% LAD prox stent 40% ISR, 85% ISR, dist stent ok with 25% ISR EF 55-65% PCI: 2.75 x 32 mm Synergy DES to ost/prox RCA  History of Present Illness:    [name] was last seen in 11/2019.  She returns  for surgical clearance. She needs an EGD and colonoscopy with Dr. Fuller Plan. She needs to hold her Plavix for 5 days for her procedure.  She is here alone.  Over the past several months, she has noted worsening shortness of breath with exertion.  She has not had orthopnea, PND.  Her leg edema is controlled.  She has not had syncope or chest discomfort.      Past Medical History:  Diagnosis Date  . Adenomatous polyp of colon 02/2004  . Anemia   . Anxiety   . Arthritis   . B12 deficiency   . CAD (coronary artery disease)    a. s/p remote BMS to LAD;  b. 09/2015 Inf STEMI/VF Arrest: LM nl, LAD 85p (staged PCI 2 days later w/ 3.0x32 Synergy DES), 40p/m, 25d, LCX 35m, RCA 80ost/100p (2.75x32 Synergy DES), 6m, EF 55-65%. // c. Myoview 2/18: EF 59, no ischemia or infarction; Normal study  . Chronic kidney disease, stage 3, mod decreased GFR ([place]) 03/09/2020  . Dermatophytosis of groin and perianal area   . Diet Controlled Diabetes Mellitus   . Diverticulosis   . GERD (gastroesophageal reflux disease)   . H/O echocardiogram    a. 09/2015 Echo: EF 60-65%, no rwma, mild AI, mildly dil RA, mild to mod TR.  Marland Kitchen Hyperlipidemia   . Hypertensive heart disease   .  Low back pain    l5 disc  . Lumbar spinal stenosis 03/03/2019  . Morbid obesity ([place])   . Osteoporosis   . PVC's (premature ventricular contractions) 04/16/2017   Holter 3/19: NSR, average heart rate 69, frequent PVCs (5% total beats), rare supraventricular ectopics, no AF/flutter  . Sleep apnea 10/03/2009   Resolved after gastric bypass    . TOBACCO ABUSE 10/05/2009   Qualifier: Diagnosis of  By: Tricia Breed, MD, Tricia [name]   . Ventricular fibrillation ([place]) 10/01/2015   a. In setting of inferior STEMI.    Current Medications: Current Meds  Medication Sig  . amLODipine (NORVASC) 10 MG tablet Take 1 tablet by mouth daily. **Dose increase**  . aspirin EC 81 MG tablet Take 81 mg by mouth daily.  Marland Kitchen atorvastatin (LIPITOR) 80 MG tablet Take 1  tablet by mouth daily.  . baclofen (LIORESAL) 10 MG tablet Take 1 tablet by mouth twice daily.  . carvedilol (COREG) 6.25 MG tablet Take 1 tablet (6.25 mg total) by mouth 2 (two) times daily with a meal.  . Cholecalciferol (VITAMIN D3 SUPER STRENGTH) 50 MCG (2000 UT) TABS Take 1 tablet by mouth daily.  . clopidogrel (PLAVIX) 75 MG tablet Take 1 tablet by mouth daily.  Marland Kitchen ezetimibe (ZETIA) 10 MG tablet Take 1 tablet (10 mg total) by mouth daily.  . famotidine (PEPCID) 40 MG tablet Take 1 tablet by mouth at bedtime.  . furosemide (LASIX) 20 MG tablet Take 1-2 tablets (20-40 mg total) by mouth as needed for fluid or edema.  . gabapentin (NEURONTIN) 100 MG capsule Take 1 capsule by mouth three times daily.  Marland Kitchen glucose blood (FREESTYLE LITE) test strip TEST UP TO FOUR TIMES DAILY Dx: E11.22  . HYDROcodone-acetaminophen (NORCO) 10-325 MG tablet Take 1 tablet by mouth daily as needed.  . Lancets (FREESTYLE) lancets TEST UP TO FOUR TIMES DAILY Dx: E11.22  . nitroGLYCERIN (NITROLINGUAL) 0.4 MG/SPRAY spray Place 1 spray under the tongue every 5 (five) minutes x 3 doses as needed for chest pain.  . pantoprazole (PROTONIX) 40 MG tablet Take 1 tablet by mouth twice daily before meals.  Marland Kitchen PROAIR HFA 108 (90 Base) MCG/ACT inhaler Inhale 2 puffs into the lungs every 6 (six) hours as needed for wheezing or shortness of breath.  . sertraline (ZOLOFT) 50 MG tablet Take 1 tablet by mouth at bedtime.  . valsartan (DIOVAN) 320 MG tablet Take 1 tablet by mouth daily.     Allergies:   Sulfa antibiotics   Social History   Tobacco Use  . Smoking status: Former Smoker    Packs/day: 2.00    Years: 41.00    Pack years: 82.00    Types: Cigarettes    Quit date: 03/06/2012    Years since quitting: 8.1  . Smokeless tobacco: Never Used  Vaping Use  . Vaping Use: Never used  Substance Use Topics  . Alcohol use: No    Alcohol/week: 0.0 standard drinks  . Drug use: No     Family Hx: The patient's family history  includes Colitis in her mother; Heart disease in her father; Lung cancer in her maternal uncle and maternal uncle; Stomach cancer in her maternal aunt. There is no history of Colon cancer.  ROS   EKGs/Labs/Other Test Reviewed:    EKG:  EKG is   ordered today.  The ekg ordered today demonstrates normal sinus rhythm, heart rate 60, left axis deviation, nonspecific ST-T wave changes, QTC 428, similar to prior tracings  Recent  Labs: 11/03/2019: Hemoglobin 12.1; Platelets 299 03/09/2020: ALT 11; BUN 25; Creatinine, Ser 1.16; Potassium 4.4; Sodium 140; TSH 5.53   Recent Lipid Panel Lab Results  Component Value Date/Time   CHOL 142 03/09/2020 09:36 AM   CHOL 125 03/05/2016 09:48 AM   TRIG 144.0 03/09/2020 09:36 AM   TRIG 122 12/11/2005 10:28 AM   HDL 50.60 03/09/2020 09:36 AM   HDL 45 03/05/2016 09:48 AM   CHOLHDL 3 03/09/2020 09:36 AM   LDLCALC 62 03/09/2020 09:36 AM   LDLCALC 105 (H) 09/06/2019 10:58 AM   LDLDIRECT 86.0 09/11/2015 11:04 AM      Risk Assessment/Calculations:      Physical Exam:    VS:  BP 126/62   Pulse 60   Ht 5\' 2"  (1.575 m)   Wt 236 lb 3.2 oz (107.1 kg)   LMP  (LMP Unknown)   SpO2 94%   BMI 43.20 kg/m     Wt Readings from Last 3 Encounters:  04/11/20 236 lb 3.2 oz (107.1 kg)  04/04/20 238 lb 3.2 oz (108 kg)  03/09/20 239 lb 9.6 oz (108.7 kg)     Constitutional:      Appearance: Healthy appearance. Not in distress.  Neck:     Vascular: JVD normal.  Pulmonary:     Breath sounds: No wheezing. No rales.     Comments: Decreased breath sounds bilaterally Cardiovascular:     Normal rate. Regular rhythm. Normal S1. Normal S2.     Murmurs: There is no murmur.  Edema:    Pretibial: bilateral trace edema of the pretibial area. Abdominal:     Palpations: Abdomen is soft.  Skin:    General: Skin is warm and dry.  Neurological:     Mental Status: Alert and oriented to person, place and time.     Cranial Nerves: Cranial nerves are intact.        ASSESSMENT & PLAN:    1. Preoperative cardiovascular examination 2. Coronary artery disease involving native coronary artery of native heart without angina pectoris 3. Shortness of breath Remote history of stenting to the LAD and inferior STEMI complicated by VF arrest in 8/17 treated with a DES to the RCA and staged PCI with DES to the LAD.  She had moderate nonobstructive disease elsewhere at the time.  Myoview in 2018 was low risk.  She does note symptoms of shortness of breath with exertion.  She feels that this has worsened over the past several months.  She also has COPD with a long history of prior smoking.  She is in need of colonoscopy and is here today for surgical clearance.  She needs to hold her clopidogrel for 5 days.  Etiologies for her shortness of breath include COPD, anginal equivalent, deconditioning obesity, CHF.  She does not have evidence of volume excess on exam.  Her leg swelling is related to venous insufficiency.  She is not having chest discomfort but I have recommended proceeding with stress testing prior to clearing her to hold clopidogrel for her procedure.  It has been several years since her last echocardiogram.  I will also obtain an echocardiogram to reassess her LV function as well as assess right heart pressures given her history of COPD.  As long as her stress test is low risk, she can proceed with her colonoscopy.  If she is able to undergo colonoscopy, she can hold clopidogrel for 5 days prior and resume post operatively when felt to be safe.  Ideally, she should remain  on aspirin without interruption.  4. Essential hypertension BP controlled.  Heart rate is improved.  Continue current medications.  5. Mixed hyperlipidemia LDL optimal on most recent lab work.  Continue current Rx.    6. Lower extremity edema Secondary to venous insufficiency.  Edema is controlled on current therapy.   Shared Decision Making/Informed Consent The risks [chest pain, shortness  of breath, cardiac arrhythmias, dizziness, blood pressure fluctuations, myocardial infarction, stroke/transient ischemic attack, nausea, vomiting, allergic reaction, radiation exposure, metallic taste sensation and life-threatening complications (estimated to be 1 in 10,000)], benefits (risk stratification, diagnosing coronary artery disease, treatment guidance) and alternatives of a nuclear stress test were discussed in detail with Ms. Madrazo and she agrees to proceed.    Dispo:  Return in about 6 months (around 10/12/2020) for Routine Follow Up, w/ Dr. Burt Knack, or Richardson Dopp, PA-C, in person.   Medication Adjustments/Labs and Tests Ordered: Current medicines are reviewed at length with the patient today.  Concerns regarding medicines are outlined above.  Tests Ordered: Orders Placed This Encounter  Procedures  . Cardiac Stress Test: Informed Consent Details: Physician/Practitioner Attestation; Transcribe to consent form and obtain patient signature  . MYOCARDIAL PERFUSION IMAGING  . EKG 12-Lead  . ECHOCARDIOGRAM COMPLETE   Medication Changes: No orders of the defined types were placed in this encounter.   Signed, Richardson Dopp, PA-C  04/11/2020 9:19 AM    [facility] [address], [place], [place]  [address] Phone: [phone]; Fax: [phone]

## 2020-04-11 ENCOUNTER — Other Ambulatory Visit: Payer: Self-pay

## 2020-04-11 ENCOUNTER — Ambulatory Visit: Payer: PPO | Admitting: Physician Assistant

## 2020-04-11 ENCOUNTER — Telehealth (HOSPITAL_COMMUNITY): Payer: Self-pay | Admitting: *Deleted

## 2020-04-11 ENCOUNTER — Encounter (HOSPITAL_COMMUNITY): Payer: Self-pay | Admitting: *Deleted

## 2020-04-11 ENCOUNTER — Encounter: Payer: Self-pay | Admitting: Physician Assistant

## 2020-04-11 VITALS — BP 126/62 | HR 60 | Ht 62.0 in | Wt 236.2 lb

## 2020-04-11 DIAGNOSIS — R0602 Shortness of breath: Secondary | ICD-10-CM | POA: Diagnosis not present

## 2020-04-11 DIAGNOSIS — I2510 Atherosclerotic heart disease of native coronary artery without angina pectoris: Secondary | ICD-10-CM | POA: Diagnosis not present

## 2020-04-11 DIAGNOSIS — Z01810 Encounter for preprocedural cardiovascular examination: Secondary | ICD-10-CM | POA: Diagnosis not present

## 2020-04-11 DIAGNOSIS — I10 Essential (primary) hypertension: Secondary | ICD-10-CM

## 2020-04-11 DIAGNOSIS — R600 Localized edema: Secondary | ICD-10-CM

## 2020-04-11 DIAGNOSIS — E782 Mixed hyperlipidemia: Secondary | ICD-10-CM | POA: Diagnosis not present

## 2020-04-11 NOTE — Telephone Encounter (Signed)
Patient is being set up for a stress test.  I will send recommendations regarding clearance once I have her stress test results back. Richardson Dopp, PA-C    04/11/2020 5:04 PM

## 2020-04-11 NOTE — Patient Instructions (Addendum)
Medication Instructions:  Your physician recommends that you continue on your current medications as directed. Please refer to the Current Medication list given to you today.  *If you need a refill on your cardiac medications before your next appointment, please call your pharmacy*   Lab Work: -None If you have labs (blood work) drawn today and your tests are completely normal, you will receive your results only by: Marland Kitchen MyChart Message (if you have MyChart) OR . A paper copy in the mail If you have any lab test that is abnormal or we need to change your treatment, we will call you to review the results.   Testing/Procedures: You are scheduled for a Myocardial Perfusion Imaging Study on Monday, March 14 at 7:30 am.   Please arrive 15 minutes prior to your appointment time for registration and insurance purposes.   The test will take approximately 3 to 4 hours to complete; you may bring reading material. If someone comes with you to your appointment, they will need to remain in the main lobby due to limited space in the testing area.   How to prepare for your Myocardial Perfusion test:   Do not eat or drink 3 hours prior to your test, except you may have water.    Do not consume products containing caffeine (regular or decaffeinated) 12 hours prior to your test (ex: coffee, chocolate, soda, tea)  Hold Lasix the am of test.    Do bring a list of your current medications with you. If not listed below, you may take your medications as normal.   Bring any held medication to your appointment, as you may be required to take it once the test is complete.   Do wear comfortable clothes (no dresses or overalls) and walking shoes. Tennis shoes are preferred. No heels or open toed shoes.  Do not wear cologne, perfume, aftershave or lotions (deodorant is allowed).   If these instructions are not followed, you test will have to be rescheduled.   Please report to [address] for your test. If you have questions or concerns about your appointment, please call the Nuclear Lab at [phone].  If you cannot keep your appointment, please provide 24 hour notification to the Nuclear lab to avoid a possible $50 charge to your account.     Your physician has requested that you have an echocardiogram. Echocardiography is a painless test that uses sound waves to create images of your heart. It provides your doctor with information about the size and shape of your heart and how well your heart's chambers and valves are working. This procedure takes approximately one hour. There are no restrictions for this procedure.     Follow-Up: At [facility], you and your health needs are our priority.  As part of our continuing mission to provide you with exceptional heart care, we have created designated Provider Care Teams.  These Care Teams include your primary Cardiologist (physician) and Advanced Practice Providers (APPs -  Physician Assistants and Nurse Practitioners) who all work together to provide you with the care you need, when you need it.  We recommend signing up for the patient portal called "MyChart".  Sign up information is provided on this After Visit Summary.  MyChart is used to connect with patients for Virtual Visits (Telemedicine).  Patients are able to view lab/test results, encounter notes, upcoming appointments, etc.  Non-urgent messages can be sent to your provider as well.   To learn more about  what you can do with MyChart, go to [email or web address].    Your next appointment:   6 month(s)  The format for your next appointment:   In Person  Provider:   You may see Sherren Mocha, MD or one of the following Advanced Practice Providers on your designated Care Team:    Richardson Dopp, PA-C  Vin Crocker, [place]    Other Instructions If stress test ok and can have colonoscopy, can hold Plavix for 5 days before but you need to stay on your Asprin.    Your physician wants you to follow-up in: 6 months with Dr. Burt Knack or Cooper Render, PA. You will receive a reminder letter in the mail two months in advance. If you don't receive a letter, please call our office to schedule the follow-up appointment.

## 2020-04-11 NOTE — Telephone Encounter (Signed)
Left message on voicemail in reference to upcoming appointment scheduled for 04/16/20. Phone number given for a call back so details instructions can be given. Tricia [name]

## 2020-04-15 ENCOUNTER — Other Ambulatory Visit: Payer: Self-pay | Admitting: Gastroenterology

## 2020-04-15 ENCOUNTER — Other Ambulatory Visit: Payer: Self-pay | Admitting: Family Medicine

## 2020-04-15 ENCOUNTER — Other Ambulatory Visit: Payer: Self-pay | Admitting: Physician Assistant

## 2020-04-15 DIAGNOSIS — M5441 Lumbago with sciatica, right side: Secondary | ICD-10-CM

## 2020-04-15 DIAGNOSIS — G8929 Other chronic pain: Secondary | ICD-10-CM

## 2020-04-16 ENCOUNTER — Ambulatory Visit (HOSPITAL_COMMUNITY): Payer: PPO | Attending: Cardiology

## 2020-04-16 ENCOUNTER — Other Ambulatory Visit: Payer: Self-pay

## 2020-04-16 DIAGNOSIS — R0602 Shortness of breath: Secondary | ICD-10-CM | POA: Diagnosis not present

## 2020-04-16 DIAGNOSIS — I2510 Atherosclerotic heart disease of native coronary artery without angina pectoris: Secondary | ICD-10-CM | POA: Diagnosis not present

## 2020-04-16 LAB — MYOCARDIAL PERFUSION IMAGING
LV dias vol: 61 mL (ref 46–106)
LV sys vol: 19 mL
Peak HR: 73 {beats}/min
Rest HR: 49 {beats}/min
SDS: 1
SRS: 1
SSS: 2
TID: 0.97

## 2020-04-16 MED ORDER — REGADENOSON 0.4 MG/5ML IV SOLN
0.4000 mg | Freq: Once | INTRAVENOUS | Status: AC
  Administered 2020-04-16: 0.4 mg via INTRAVENOUS

## 2020-04-16 MED ORDER — TECHNETIUM TC 99M TETROFOSMIN IV KIT
31.3000 | PACK | Freq: Once | INTRAVENOUS | Status: AC | PRN
  Administered 2020-04-16: 31.3 via INTRAVENOUS
  Filled 2020-04-16: qty 32

## 2020-04-16 MED ORDER — TECHNETIUM TC 99M TETROFOSMIN IV KIT
10.5000 | PACK | Freq: Once | INTRAVENOUS | Status: AC | PRN
  Administered 2020-04-16: 10.5 via INTRAVENOUS
  Filled 2020-04-16: qty 11

## 2020-04-17 ENCOUNTER — Encounter: Payer: Self-pay | Admitting: Physician Assistant

## 2020-04-17 NOTE — Telephone Encounter (Signed)
    GATED SPECT MYO PERF W/LEXISCAN STRESS 1D 04/16/2020  Narrative  The left ventricular ejection fraction is hyperdynamic (>65%).  Nuclear stress EF: 70%. There are no wall motion abnormalities  There was no ST segment deviation noted during stress.  The study is normal.  This is a low risk study. There is no evidence of ischemia or infarction.  Candee Furbish, MD  Recommendations:  As the patient's stress test is low risk, she may proceed with her procedure at acceptable CV risk.  She may hold her Clopidogrel (Plavix) for 5 days prior to her procedure and resume after as soon as it is felt to be safe.   Continue on ASA without interruption. Richardson Dopp, PA-C    04/17/2020 5:17 PM

## 2020-04-18 NOTE — Telephone Encounter (Signed)
Informed patient that she can hold Plavix 5 days prior to her procedure. Patient verbalized understanding. 

## 2020-04-19 ENCOUNTER — Telehealth: Payer: Self-pay

## 2020-04-19 ENCOUNTER — Other Ambulatory Visit: Payer: Self-pay

## 2020-04-19 ENCOUNTER — Ambulatory Visit (HOSPITAL_COMMUNITY): Payer: PPO | Attending: Cardiology

## 2020-04-19 DIAGNOSIS — M8949 Other hypertrophic osteoarthropathy, multiple sites: Secondary | ICD-10-CM

## 2020-04-19 DIAGNOSIS — E782 Mixed hyperlipidemia: Secondary | ICD-10-CM | POA: Diagnosis not present

## 2020-04-19 DIAGNOSIS — I10 Essential (primary) hypertension: Secondary | ICD-10-CM

## 2020-04-19 DIAGNOSIS — I2510 Atherosclerotic heart disease of native coronary artery without angina pectoris: Secondary | ICD-10-CM | POA: Diagnosis not present

## 2020-04-19 DIAGNOSIS — R0602 Shortness of breath: Secondary | ICD-10-CM

## 2020-04-19 DIAGNOSIS — R600 Localized edema: Secondary | ICD-10-CM

## 2020-04-19 DIAGNOSIS — Z01810 Encounter for preprocedural cardiovascular examination: Secondary | ICD-10-CM | POA: Diagnosis not present

## 2020-04-19 DIAGNOSIS — M159 Polyosteoarthritis, unspecified: Secondary | ICD-10-CM

## 2020-04-19 LAB — ECHOCARDIOGRAM COMPLETE
Area-P 1/2: 3.61 cm2
P 1/2 time: 940 msec
S' Lateral: 2.3 cm

## 2020-04-19 MED ORDER — PERFLUTREN LIPID MICROSPHERE
1.0000 mL | INTRAVENOUS | Status: AC | PRN
  Administered 2020-04-19: 1 mL via INTRAVENOUS

## 2020-04-19 MED ORDER — HYDROCODONE-ACETAMINOPHEN 10-325 MG PO TABS: 1.0000 | ORAL_TABLET | Freq: Every day | ORAL | 0 refills | Status: DC | PRN

## 2020-04-19 NOTE — Telephone Encounter (Signed)
..   LAST APPOINTMENT DATE: 04/15/2020   NEXT APPOINTMENT DATE:@5 /06/2020  MEDICATION:HYDROcodone-acetaminophen (NORCO) 10-325 MG tablet

## 2020-04-19 NOTE — Telephone Encounter (Signed)
Last refill: 09/16/19 #30, 0 Last OV: 03/09/20 dx. DM f/u

## 2020-04-19 NOTE — Telephone Encounter (Signed)
Refill request sent to PCP.

## 2020-04-20 ENCOUNTER — Encounter: Payer: Self-pay | Admitting: Physician Assistant

## 2020-04-20 NOTE — Telephone Encounter (Signed)
-----   Message from Liliane Shi, [place] sent at 04/20/2020  1:39 PM EDT ----- Please call patient The echocardiogram demonstrates normal heart function (ejection fraction).  There is mild stiffness during relaxation (diastolic dysfunction).  Lung pressures are normal (pulmonary artery systolic pressure). PLAN:  -Continue current medications/treatment plan and follow up as scheduled.  -Send copy to PCP Richardson Dopp, PA-C    04/20/2020 1:35 PM

## 2020-04-20 NOTE — Telephone Encounter (Signed)
Reviewed results with patient who verbalized understanding.    The patient inquires whether her TR could be causing fatigue and SOB.  Reiterated to her that leaking is mild to moderate and likely would not cause significant symptoms, but would send to [place] to ask his opinion.  She understands she will be called back if Nicki Reaper has anything to add.  She was grateful for call.

## 2020-04-23 ENCOUNTER — Encounter (HOSPITAL_BASED_OUTPATIENT_CLINIC_OR_DEPARTMENT_OTHER): Payer: Self-pay

## 2020-04-23 ENCOUNTER — Other Ambulatory Visit: Payer: Self-pay

## 2020-04-23 ENCOUNTER — Ambulatory Visit (HOSPITAL_BASED_OUTPATIENT_CLINIC_OR_DEPARTMENT_OTHER)
Admission: RE | Admit: 2020-04-23 | Discharge: 2020-04-23 | Disposition: A | Discharge status: Home / Self Care | Payer: PPO | Source: Ambulatory Visit | Attending: Acute Care | Admitting: Acute Care

## 2020-04-23 ENCOUNTER — Ambulatory Visit (HOSPITAL_BASED_OUTPATIENT_CLINIC_OR_DEPARTMENT_OTHER)
Admission: RE | Admit: 2020-04-23 | Discharge: 2020-04-23 | Disposition: A | Discharge status: Home / Self Care | Payer: PPO | Source: Ambulatory Visit | Attending: Family Medicine | Admitting: Family Medicine

## 2020-04-23 DIAGNOSIS — Z87891 Personal history of nicotine dependence: Secondary | ICD-10-CM | POA: Diagnosis not present

## 2020-04-23 DIAGNOSIS — Z1231 Encounter for screening mammogram for malignant neoplasm of breast: Secondary | ICD-10-CM | POA: Diagnosis not present

## 2020-04-23 NOTE — Telephone Encounter (Signed)
Patient called back in and stated she spoke to her Insurance and she didn't need anything sent in.

## 2020-04-23 NOTE — Telephone Encounter (Signed)
Pt called regarding her hydrocodone. Pt states due to Medicare, the first prescription needs to be 7 days, and then another prescription can be sent in for 30 days. Please advise.

## 2020-04-25 ENCOUNTER — Ambulatory Visit (HOSPITAL_BASED_OUTPATIENT_CLINIC_OR_DEPARTMENT_OTHER)
Admission: RE | Admit: 2020-04-25 | Discharge: 2020-04-25 | Disposition: A | Discharge status: Home / Self Care | Payer: PPO | Source: Ambulatory Visit | Attending: Acute Care | Admitting: Acute Care

## 2020-04-25 ENCOUNTER — Other Ambulatory Visit: Payer: Self-pay

## 2020-04-25 DIAGNOSIS — H43811 Vitreous degeneration, right eye: Secondary | ICD-10-CM | POA: Diagnosis not present

## 2020-04-25 DIAGNOSIS — H43391 Other vitreous opacities, right eye: Secondary | ICD-10-CM | POA: Diagnosis not present

## 2020-04-25 DIAGNOSIS — Z87891 Personal history of nicotine dependence: Secondary | ICD-10-CM | POA: Diagnosis not present

## 2020-05-01 ENCOUNTER — Encounter: Payer: Self-pay | Admitting: Certified Registered Nurse Anesthetist

## 2020-05-02 ENCOUNTER — Encounter: Payer: Self-pay | Admitting: Gastroenterology

## 2020-05-02 ENCOUNTER — Other Ambulatory Visit: Payer: Self-pay

## 2020-05-02 ENCOUNTER — Ambulatory Visit (AMBULATORY_SURGERY_CENTER): Payer: PPO | Admitting: Gastroenterology

## 2020-05-02 VITALS — BP 119/66 | HR 53 | Temp 97.0°F | Resp 10 | Ht 62.0 in | Wt 238.0 lb

## 2020-05-02 DIAGNOSIS — D123 Benign neoplasm of transverse colon: Secondary | ICD-10-CM

## 2020-05-02 DIAGNOSIS — D120 Benign neoplasm of cecum: Secondary | ICD-10-CM

## 2020-05-02 DIAGNOSIS — I252 Old myocardial infarction: Secondary | ICD-10-CM | POA: Diagnosis not present

## 2020-05-02 DIAGNOSIS — K319 Disease of stomach and duodenum, unspecified: Secondary | ICD-10-CM

## 2020-05-02 DIAGNOSIS — K219 Gastro-esophageal reflux disease without esophagitis: Secondary | ICD-10-CM | POA: Diagnosis not present

## 2020-05-02 DIAGNOSIS — G4733 Obstructive sleep apnea (adult) (pediatric): Secondary | ICD-10-CM | POA: Diagnosis not present

## 2020-05-02 DIAGNOSIS — D124 Benign neoplasm of descending colon: Secondary | ICD-10-CM

## 2020-05-02 DIAGNOSIS — Z860101 Personal history of adenomatous and serrated colon polyps: Secondary | ICD-10-CM

## 2020-05-02 DIAGNOSIS — D122 Benign neoplasm of ascending colon: Secondary | ICD-10-CM | POA: Diagnosis not present

## 2020-05-02 DIAGNOSIS — Z86010 Personal history of colonic polyps: Secondary | ICD-10-CM | POA: Diagnosis not present

## 2020-05-02 DIAGNOSIS — K3189 Other diseases of stomach and duodenum: Secondary | ICD-10-CM | POA: Diagnosis not present

## 2020-05-02 DIAGNOSIS — I2510 Atherosclerotic heart disease of native coronary artery without angina pectoris: Secondary | ICD-10-CM | POA: Diagnosis not present

## 2020-05-02 MED ORDER — SODIUM CHLORIDE 0.9 % IV SOLN: 500.0000 mL | Freq: Once | INTRAVENOUS | Status: DC

## 2020-05-02 NOTE — Progress Notes (Signed)
1552 Robinul 0.1 mg IV given due large amount of secretions upon assessment.  MD made aware, vss

## 2020-05-02 NOTE — Op Note (Signed)
[facility] Patient Name: Tricia [name] Procedure Date: 05/02/2020 3:44 PM MRN: [id number] Endoscopist: Ladene Artist , MD Age: 75 Referring MD:  Date of Birth: 08-Apr-1945 Gender: Female Account #: [id number] Procedure:                Upper GI endoscopy Indications:              Gastroesophageal reflux disease Medicines:                Monitored Anesthesia Care Procedure:                Pre-Anesthesia Assessment:                           - Prior to the procedure, a History and Physical                            was performed, and patient medications and                            allergies were reviewed. The patient's tolerance of                            previous anesthesia was also reviewed. The risks                            and benefits of the procedure and the sedation                            options and risks were discussed with the patient.                            All questions were answered, and informed consent                            was obtained. Prior Anticoagulants: The patient has                            taken Plavix (clopidogrel), last dose was 5 days                            prior to procedure. ASA Grade Assessment: III - A                            patient with severe systemic disease. After                            reviewing the risks and benefits, the patient was                            deemed in satisfactory condition to undergo the                            procedure.  After obtaining informed consent, the endoscope was                            passed under direct vision. Throughout the                            procedure, the patient's blood pressure, pulse, and                            oxygen saturations were monitored continuously. The                            Endoscope was introduced through the mouth, and                            advanced to the second part of duodenum. The upper                             GI endoscopy was accomplished without difficulty.                            Unable to safely retroflex with limited space in                            the gastric pouch. The patient tolerated the                            procedure well. Scope In: Scope Out: Findings:                 The examined esophagus was normal.                           Evidence of a gastric bypass was found. A gastric                            pouch with a normal size was found. The                            gastrojejunal anastomosis was characterized by                            healthy appearing mucosa.                           Patchy moderately erythematous mucosa without                            bleeding was found in the gastric fundus and in the                            gastric body. Biopsies were taken with a cold                            forceps for histology.  The examined limbs of jejunum were normal. Complications:            No immediate complications. Estimated Blood Loss:     Estimated blood loss was minimal. Impression:               - Normal esophagus.                           - Gastric bypass with a normal-sized pouch.                            Gastrojejunal anastomosis characterized by healthy                            appearing mucosa.                           - Erythematous mucosa in the gastric fundus and                            gastric body. Biopsied.                           - Normal examined jejunum limbs. Recommendation:           - Patient has a contact number available for                            emergencies. The signs and symptoms of potential                            delayed complications were discussed with the                            patient. Return to normal activities tomorrow.                            Written discharge instructions were provided to the                            patient.                            - Resume previous diet.                           - Follow antireflux measures.                           - Continue present medications.                           - Await pathology results.                           - Resume Plavix (clopidogrel) at prior dose in 2  days. Refer to managing physician for further                            adjustment of therapy. Ladene Artist, MD 05/02/2020 4:37:27 PM This report has been signed electronically.

## 2020-05-02 NOTE — Progress Notes (Signed)
HC IV.

## 2020-05-02 NOTE — Progress Notes (Signed)
Called to room to assist during endoscopic procedure.  Patient ID and intended procedure confirmed with present staff. Received instructions for my participation in the procedure from the performing physician.  

## 2020-05-02 NOTE — Op Note (Addendum)
[place] Patient Name: Tricia [name] Procedure Date: 05/02/2020 3:45 PM MRN: [id number] Endoscopist: Ladene Artist , MD Age: 75 Referring MD:  Date of Birth: Apr 18, 1945 Gender: Female Account #: [id number] Procedure:                Colonoscopy Indications:              Surveillance: Personal history of adenomatous                            polyps on last colonoscopy 5 years ago Medicines:                Monitored Anesthesia Care Procedure:                Pre-Anesthesia Assessment:                           - Prior to the procedure, a History and Physical                            was performed, and patient medications and                            allergies were reviewed. The patient's tolerance of                            previous anesthesia was also reviewed. The risks                            and benefits of the procedure and the sedation                            options and risks were discussed with the patient.                            All questions were answered, and informed consent                            was obtained. Prior Anticoagulants: The patient has                            taken Plavix (clopidogrel), last dose was 5 days                            prior to procedure. ASA Grade Assessment: III - A                            patient with severe systemic disease. After                            reviewing the risks and benefits, the patient was                            deemed in satisfactory condition to undergo the  procedure.                           After obtaining informed consent, the colonoscope                            was passed under direct vision. Throughout the                            procedure, the patient's blood pressure, pulse, and                            oxygen saturations were monitored continuously. The                            Olympus PCF-H190DL [id number]) Colonoscope was                             introduced through the anus and advanced to the the                            cecum, identified by appendiceal orifice and                            ileocecal valve. The ileocecal valve, appendiceal                            orifice, and rectum were photographed. The quality                            of the bowel preparation was good. The colonoscopy                            was performed without difficulty. The patient                            tolerated the procedure well. Scope In: 3:55:39 PM Scope Out: 4:17:38 PM Scope Withdrawal Time: 0 hours 17 minutes 22 seconds  Total Procedure Duration: 0 hours 21 minutes 59 seconds  Findings:                 The perianal and digital rectal examinations were                            normal.                           Four sessile polyps were found in the descending                            colon, transverse colon, ascending colon and cecum.                            The polyps were 6 to 8 mm in size. These polyps  were removed with a cold snare. Resection and                            retrieval were complete.                           Two sessile polyps were found in the transverse                            colon. The polyps were 4 to 5 mm in size. These                            polyps were removed with a cold biopsy forceps.                            Resection and retrieval were complete.                           Multiple medium-mouthed diverticula were found in                            the left colon. There was no evidence of                            diverticular bleeding.                           Internal hemorrhoids were found during                            retroflexion. The hemorrhoids were small and Grade                            I (internal hemorrhoids that do not prolapse).                           The exam was otherwise without abnormality on                             direct and retroflexion views. Complications:            No immediate complications. Estimated blood loss:                            None. Estimated Blood Loss:     Estimated blood loss: none. Impression:               - Four 6 to 8 mm polyps in the descending colon, in                            the transverse colon, in the ascending colon and in                            the cecum, removed with a cold snare. Resected and  retrieved.                           - Two 4 to 5 mm polyps in the transverse colon,                            removed with a cold biopsy forceps. Resected and                            retrieved.                           - Moderate diverticulosis in the left colon.                           - Internal hemorrhoids.                           - The examination was otherwise normal on direct                            and retroflexion views. Recommendation:           - Repeat colonoscopy after studies are complete for                            surveillance based on pathology results.                           - Patient has a contact number available for                            emergencies. The signs and symptoms of potential                            delayed complications were discussed with the                            patient. Return to normal activities tomorrow.                            Written discharge instructions were provided to the                            patient.                           - High fiber diet.                           - Continue present medications.                           - Await pathology results.                           - Resume Plavix (clopidogrel) at prior dose in 2  days. Refer to managing physician for further                            adjustment of therapy. Ladene Artist, MD 05/02/2020 4:30:25 PM This report has been signed electronically.

## 2020-05-02 NOTE — Patient Instructions (Signed)
YOU HAD AN ENDOSCOPIC PROCEDURE TODAY AT THE [facility]:   Refer to the procedure report that was given to you for any specific questions about what was found during the examination.  If the procedure report does not answer your questions, please call your gastroenterologist to clarify.  If you requested that your care partner not be given the details of your procedure findings, then the procedure report has been included in a sealed envelope for you to review at your convenience later.  YOU SHOULD EXPECT: Some feelings of bloating in the abdomen. Passage of more gas than usual.  Walking can help get rid of the air that was put into your GI tract during the procedure and reduce the bloating. If you had a lower endoscopy (such as a colonoscopy or flexible sigmoidoscopy) you may notice spotting of blood in your stool or on the toilet paper. If you underwent a bowel prep for your procedure, you may not have a normal bowel movement for a few days.  Please Note:  You might notice some irritation and congestion in your nose or some drainage.  This is from the oxygen used during your procedure.  There is no need for concern and it should clear up in a day or so.  SYMPTOMS TO REPORT IMMEDIATELY:   Following lower endoscopy (colonoscopy or flexible sigmoidoscopy):  Excessive amounts of blood in the stool  Significant tenderness or worsening of abdominal pains  Swelling of the abdomen that is new, acute  Fever of 100F or higher   Following upper endoscopy (EGD)  Vomiting of blood or coffee ground material  New chest pain or pain under the shoulder blades  Painful or persistently difficult swallowing  New shortness of breath  Fever of 100F or higher  Black, tarry-looking stools  For urgent or emergent issues, a gastroenterologist can be reached at any hour by calling [phone]. Do not use MyChart messaging for urgent concerns.    DIET:  We do recommend a small meal at first, but  then you may proceed to your regular diet.  Drink plenty of fluids but you should avoid alcoholic beverages for 24 hours.  ACTIVITY:  You should plan to take it easy for the rest of today and you should NOT DRIVE or use heavy machinery until tomorrow (because of the sedation medicines used during the test).    FOLLOW UP: Our staff will call the number listed on your records 48-72 hours following your procedure to check on you and address any questions or concerns that you may have regarding the information given to you following your procedure. If we do not reach you, we will leave a message.  We will attempt to reach you two times.  During this call, we will ask if you have developed any symptoms of COVID 19. If you develop any symptoms (ie: fever, flu-like symptoms, shortness of breath, cough etc.) before then, please call [phone].  If you test positive for Covid 19 in the 2 weeks post procedure, please call and report this information to us.    If any biopsies were taken you will be contacted by phone or by letter within the next 1-3 weeks.  Please call us at [phone] if you have not heard about the biopsies in 3 weeks.    SIGNATURES/CONFIDENTIALITY: You and/or your care partner have signed paperwork which will be entered into your electronic medical record.  These signatures attest to the fact that that the information above on   your After Visit Summary has been reviewed and is understood.  Full responsibility of the confidentiality of this discharge information lies with you and/or your care-partner. 

## 2020-05-02 NOTE — Progress Notes (Signed)
Report given to PACU, vss 

## 2020-05-04 ENCOUNTER — Telehealth: Payer: Self-pay

## 2020-05-04 ENCOUNTER — Telehealth: Payer: Self-pay | Admitting: *Deleted

## 2020-05-04 NOTE — Progress Notes (Signed)
Please call patient and let them  know their  low dose Ct was read as a Lung RADS 2: nodules that are benign in appearance and behavior with a very low likelihood of becoming a clinically active cancer due to size or lack of growth. Recommendation per radiology is for a repeat LDCT in 12 months. .Please let them  know we will order and schedule their  annual screening scan for 04/2021. Please let them  know there was notation of CAD on their  scan.  Please remind the patient  that this is a non-gated exam therefore degree or severity of disease  cannot be determined. Please have them  follow up with their PCP regarding potential risk factor modification, dietary therapy or pharmacologic therapy if clinically indicated. Pt.  is  currently on statin therapy. Please place order for annual  screening scan for  04/2021 and fax results to PCP. Thanks so much.  Dr. Rosendo Gros, there was notation of Dilated main pulmonary artery, suggesting pulmonary arterial hypertension.There was also notation of CAD. I did not see a cards note in Epic, but patient may be followed by Cards that I cannot see. Would you like me to refer to one of the Pulmonary docs to be evaluated for PAH? Let me know, I am happy to do so.

## 2020-05-04 NOTE — Telephone Encounter (Signed)
Second post procedure follow up call, no answer 

## 2020-05-04 NOTE — Telephone Encounter (Signed)
Follow up call made. 

## 2020-05-07 ENCOUNTER — Other Ambulatory Visit: Payer: Self-pay | Admitting: *Deleted

## 2020-05-07 DIAGNOSIS — Z87891 Personal history of nicotine dependence: Secondary | ICD-10-CM

## 2020-05-09 ENCOUNTER — Encounter: Payer: Self-pay | Admitting: Gastroenterology

## 2020-05-17 ENCOUNTER — Telehealth: Payer: Self-pay | Admitting: Family Medicine

## 2020-05-17 NOTE — Chronic Care Management (AMB) (Signed)
  Chronic Care Management   Outreach Note  05/17/2020 Name: Tricia [name] MRN: [id number] DOB: [birth year]  Referred by: Leamon Arnt, MD Reason for referral : No chief complaint on file.   An unsuccessful telephone outreach was attempted today. The patient was referred to the pharmacist for assistance with care management and care coordination.   Follow Up Plan:   [name] Upstream Scheduler

## 2020-05-17 NOTE — Chronic Care Management (AMB) (Signed)
  Chronic Care Management   Note  05/17/2020 Name: Tricia [name] MRN: [id number] DOB: [birth year]  Tricia [name] is a 75 y.o. year old female who is a primary care patient of Leamon Arnt, MD. I reached out to [name] by phone today in response to a referral sent by Ms. [name]'s PCP, Leamon Arnt, MD.   [name] was given information about Chronic Care Management services today including:  1. CCM service includes personalized support from designated clinical staff supervised by her physician, including individualized plan of care and coordination with other care providers 2. 24/7 contact phone numbers for assistance for urgent and routine care needs. 3. Service will only be billed when office clinical staff spend 20 minutes or more in a month to coordinate care. 4. Only one practitioner may furnish and bill the service in a calendar month. 5. The patient may stop CCM services at any time (effective at the end of the month) by phone call to the office staff.   Patient agreed to services and verbal consent obtained.   Follow up plan:   [name] Upstream Scheduler

## 2020-05-29 NOTE — Patient Instructions (Signed)
Tricia [name],  Thank you for talking with me today. I have included our care plan/goals in the following pages.   Please review and call me at [phone] with any questions.  Thanks! Ellin Mayhew, Pharm.D., BCGP Clinical Pharmacist [facility] [place] [phone] There are no care plans to display for this patient.    ***The patient was given the following information about Chronic Care Management services today, agreed to services, and gave verbal consent: 1. CCM service includes personalized support from designated clinical staff supervised by the primary care provider, including individualized plan of care and coordination with other care providers 2. 24/7 contact phone numbers for assistance for urgent and routine care needs. 3. Service will only be billed when office clinical staff spend 20 minutes or more in a month to coordinate care. 4. Only one practitioner may furnish and bill the service in a calendar month. 5.The patient may stop CCM services at any time (effective at the end of the month) by phone call to the office staff. 6. The patient will be responsible for cost sharing (co-pay) of up to 20% of the service fee (after annual deductible is met). Patient agreed to services and consent obtained.  The patient verbalized understanding of instructions provided today and agreed to receive a *** copy of patient instruction and/or educational materials. Telephone follow up appointment with pharmacy team member scheduled for: See next appointment with "Care Management Staff" under "What's Next" below.

## 2020-05-30 ENCOUNTER — Ambulatory Visit: Payer: PPO

## 2020-05-30 ENCOUNTER — Telehealth: Payer: Self-pay

## 2020-05-30 NOTE — Chronic Care Management (AMB) (Signed)
    Chronic Care Management Pharmacy Assistant   Name: Tricia [name]  MRN: [id number] DOB: 05-11-1945  Reason for Encounter: Chart Prep/IQ  Recent office visits:  03/09/20- Billey Chang, MD- chronic conditions addressed, started albuterol 108 mcg/act 2 puffs q6h prn  12/11/20- Billey Chang, MD- acute kidney injury, short course amoxicillin 875-125 mg x 7 ds  Recent consult visits:  04/11/20- Richardson Dopp, PA-C (Cardiology)- preoperative cardiovascular exam 04/04/20- Kennedy Bucker, MD [facility])- endoscopy and colonoscopy scheduled, Prep rx given   Hospital visits:  None in previous 6 months  Medications: Outpatient Encounter Medications as of 05/30/2020  Medication Sig  . amLODipine (NORVASC) 10 MG tablet Take 1 tablet by mouth daily. **Dose increase**  . aspirin EC 81 MG tablet Take 81 mg by mouth daily.  Marland Kitchen atorvastatin (LIPITOR) 80 MG tablet Take 1 tablet by mouth daily.  . baclofen (LIORESAL) 10 MG tablet Take 1 tablet by mouth twice daily.  . carvedilol (COREG) 6.25 MG tablet Take 1 tablet by mouth twice daily with a meal.  . Cholecalciferol (VITAMIN D3 SUPER STRENGTH) 50 MCG (2000 UT) TABS Take 1 tablet by mouth daily.  . clopidogrel (PLAVIX) 75 MG tablet Take 1 tablet by mouth daily.  Marland Kitchen ezetimibe (ZETIA) 10 MG tablet Take 1 tablet (10 mg total) by mouth daily.  . famotidine (PEPCID) 40 MG tablet Take 1 tablet by mouth at bedtime.  . furosemide (LASIX) 20 MG tablet Take 1-2 tablets (20-40 mg total) by mouth as needed for fluid or edema.  . gabapentin (NEURONTIN) 100 MG capsule Take 1 capsule by mouth three times daily.  Marland Kitchen glucose blood (FREESTYLE LITE) test strip TEST UP TO FOUR TIMES DAILY Dx: E11.22  . HYDROcodone-acetaminophen (NORCO) 10-325 MG tablet Take 1 tablet by mouth daily as needed.  . Lancets (FREESTYLE) lancets TEST UP TO FOUR TIMES DAILY Dx: E11.22  . nitroGLYCERIN (NITROLINGUAL) 0.4 MG/SPRAY spray Place 1 spray under the tongue every 5 (five) minutes x 3 doses as  needed for chest pain.  . pantoprazole (PROTONIX) 40 MG tablet Take 1 tablet by mouth twice daily before meals.  Marland Kitchen PROAIR HFA 108 (90 Base) MCG/ACT inhaler Inhale 2 puffs into the lungs every 6 (six) hours as needed for wheezing or shortness of breath.  . sertraline (ZOLOFT) 50 MG tablet Take 1 tablet by mouth at bedtime.  . valsartan (DIOVAN) 320 MG tablet Take 1 tablet by mouth daily.   No facility-administered encounter medications on file as of 05/30/2020.   Current Documented Medications  Amlodipine 10 mg- 30 DS last filled 05/08/20 asprin 81 mg Atorvastatin 80 mg- 30 DS last filled 05/08/20 Baclofen 10 mg- 30 DS last filled 05/08/20 Carvedilol 6.25 mg- 30 DS last filled 05/08/20 Cholecalciferol 50 mcg  Clopidogrel 75 mg - 30 DS last filled 05/08/20 Ezetimibe 10 mg- 30 DS last filled 05/08/20 Famotidine 40 mg- 30 DS last filled 05/08/20 Furosemide 20 mg- 30 DS last filled 02/08/20 Gabapentin 100 mg- 30 DS last filled 05/08/20 Hydrocodone- acetaminophen 10-325 mg - 30 DS last filled 04/23/20 Nitroglycerin 0.4 mg/ spray Pantoprazole 40 mg- 30 DS last filled 05/08/20 ProAir HFA 108 mcg/ act- 25 DS last filled 04/08/20 Sertraline 50 mg- 30 DS last filled 05/08/20 Valsartan 320 mg - 30 DS last filled 05/08/20   Please bring medications and supplements to appointment  Unable to reach patient after several attempts CPP notified. Chart prep completed prior to CPP phone  Visit  [name] CPA, [place]

## 2020-06-07 ENCOUNTER — Encounter: Payer: Self-pay | Admitting: Family Medicine

## 2020-06-07 ENCOUNTER — Other Ambulatory Visit: Payer: Self-pay

## 2020-06-07 ENCOUNTER — Ambulatory Visit (INDEPENDENT_AMBULATORY_CARE_PROVIDER_SITE_OTHER): Payer: PPO | Admitting: Family Medicine

## 2020-06-07 VITALS — BP 130/66 | HR 58 | Temp 97.3°F | Ht 62.0 in | Wt 231.4 lb

## 2020-06-07 DIAGNOSIS — Z905 Acquired absence of kidney: Secondary | ICD-10-CM

## 2020-06-07 DIAGNOSIS — N1831 Chronic kidney disease, stage 3a: Secondary | ICD-10-CM | POA: Diagnosis not present

## 2020-06-07 DIAGNOSIS — E119 Type 2 diabetes mellitus without complications: Secondary | ICD-10-CM | POA: Diagnosis not present

## 2020-06-07 DIAGNOSIS — E1159 Type 2 diabetes mellitus with other circulatory complications: Secondary | ICD-10-CM | POA: Diagnosis not present

## 2020-06-07 DIAGNOSIS — N2581 Secondary hyperparathyroidism of renal origin: Secondary | ICD-10-CM | POA: Diagnosis not present

## 2020-06-07 DIAGNOSIS — R7989 Other specified abnormal findings of blood chemistry: Secondary | ICD-10-CM

## 2020-06-07 DIAGNOSIS — I152 Hypertension secondary to endocrine disorders: Secondary | ICD-10-CM | POA: Diagnosis not present

## 2020-06-07 DIAGNOSIS — J449 Chronic obstructive pulmonary disease, unspecified: Secondary | ICD-10-CM | POA: Diagnosis not present

## 2020-06-07 LAB — T4, FREE: Free T4: 0.97 ng/dL (ref 0.60–1.60)

## 2020-06-07 LAB — RENAL FUNCTION PANEL
Albumin: 4.2 g/dL (ref 3.5–5.2)
BUN: 30 mg/dL — ABNORMAL HIGH (ref 6–23)
CO2: 29 mEq/L (ref 19–32)
Calcium: 9.7 mg/dL (ref 8.4–10.5)
Chloride: 105 mEq/L (ref 96–112)
Creatinine, Ser: 1.19 mg/dL (ref 0.40–1.20)
GFR: 45.6 mL/min — ABNORMAL LOW (ref >60.00)
Glucose, Bld: 133 mg/dL — ABNORMAL HIGH (ref 70–99)
Phosphorus: 4.4 mg/dL (ref 2.3–4.6)
Potassium: 4.4 mEq/L (ref 3.5–5.1)
Sodium: 143 mEq/L (ref 135–145)

## 2020-06-07 LAB — TSH: TSH: 3.3 u[IU]/mL (ref 0.35–4.50)

## 2020-06-07 LAB — HEMOGLOBIN A1C: Hgb A1c MFr Bld: 6.7 % — ABNORMAL HIGH (ref 4.6–6.5)

## 2020-06-07 LAB — T3: T3, Total: 100 ng/dL (ref 76–181)

## 2020-06-07 MED ORDER — SPIRIVA RESPIMAT 2.5 MCG/ACT IN AERS: 2.0000 | INHALATION_SPRAY | Freq: Every day | RESPIRATORY_TRACT | 11 refills | Status: DC

## 2020-06-07 NOTE — Patient Instructions (Signed)
Please return in 3 months for your annual complete physical; please come fasting.  I will release your lab results to you on your MyChart account with further instructions. Please reply with any questions.   If you have any questions or concerns, please don't hesitate to send me a message via MyChart or call the office at [phone]. Thank you for visiting with us today! It's our pleasure caring for you.   

## 2020-06-07 NOTE — Progress Notes (Signed)
Subjective  CC:  Chief Complaint  Patient presents with  . Diabetes    HPI: Tricia [name] is a 75 y.o. female who presents to the office today for follow up of diabetes and problems listed above in the chief complaint.   Diabetes follow up: Her diabetic control is reported as Unchanged.  She is diet controlled.  No symptoms of hyperglycemia. She denies exertional CP or SOB or symptomatic hypoglycemia. She denies foot sores or paresthesias.   Abnormal TSH: 3 months ago TSH was mildly elevated.  She denies symptoms of hypothyroidism.  Here for recheck.  Hypertension remains well controlled without chest pain or shortness of breath.  Stage III chronic kidney disease: Patient's lower extremity edema controlled with Lasix.  In part due to diastolic dysfunction.  Reviewed recent echocardiogram.  Has had negative ischemic work-up.  Shortness of breath related to COPD.  Patient using ProAir for wheezing.  Says it is helpful.   Wt Readings from Last 3 Encounters:  06/07/20 231 lb 6.1 oz (105 kg)  05/02/20 238 lb (108 kg)  04/16/20 236 lb (107 kg)    BP Readings from Last 3 Encounters:  06/07/20 130/66  05/02/20 119/66  04/19/20 139/74    Assessment  1. Diet-controlled diabetes mellitus ([place])   2. Abnormal TSH   3. Chronic obstructive pulmonary disease, unspecified COPD type ([place])   4. Hypertension associated with diabetes ([place])   5. Stage 3a chronic kidney disease ([place])   6. History of partial nephrectomy   7. Hyperparathyroidism, secondary ([place])      Plan   Diabetes has been diet controlled.  Recheck A1c today.  Abnormal TSH: Recheck, clinically euthyroid.  COPD: Further education given.  Trial of Spiriva.  Patient to monitor symptoms for cough, shortness of breath or wheezing.  Use ProAir for rescue inhaler.  Follow-up with PFTs if needed.  Hypertension is well controlled.  Monitor kidney disease with renal function every 6 months.  Recheck today.  Avoid  nephrotoxins.  Secondary hyperparathyroidism with normal calcium.  Monitor  Follow up: 3 months for complete physical. Orders Placed This Encounter  Procedures  . T3  . T4, free  . TSH  . Hemoglobin A1c  . Renal function panel   Meds ordered this encounter  Medications  . Tiotropium Bromide Monohydrate (SPIRIVA RESPIMAT) 2.5 MCG/ACT AERS    Sig: Inhale 2 puffs into the lungs daily.    Dispense:  4 g    Refill:  11      Immunization History  Administered Date(s) Administered  . Fluad Quad(high Dose 65+) 10/07/2018, 10/18/2019  . Influenza Split 01/21/2011  . Influenza Whole 11/19/2007  . Influenza, High Dose Seasonal PF 10/16/2015, 11/12/2016, 11/07/2017  . Influenza-Unspecified 12/18/2013  . PFIZER Comirnaty(Gray Top)Covid-19 Tri-Sucrose Vaccine 05/31/2020  . PFIZER(Purple Top)SARS-COV-2 Vaccination 03/17/2019, 04/11/2019, 11/03/2019  . Pneumococcal Conjugate-13 08/26/2013  . Pneumococcal Polysaccharide-23 02/04/2003, 06/08/2015  . Td 02/03/2005  . Zoster Recombinat (Shingrix) 02/18/2017, 07/19/2017    Diabetes Related Lab Review: Lab Results  Component Value Date   HGBA1C 6.5 (A) 03/09/2020   HGBA1C 6.3 (A) 09/06/2019   HGBA1C 6.3 (A) 04/15/2019    Lab Results  Component Value Date   MICROALBUR 1.4 09/06/2019   Lab Results  Component Value Date   CREATININE 1.16 03/09/2020   BUN 25 (H) 03/09/2020   NA 140 03/09/2020   K 4.4 03/09/2020   CL 104 03/09/2020   CO2 31 03/09/2020   Lab Results  Component Value Date  CHOL 142 03/09/2020   CHOL 177 09/06/2019   CHOL 143 08/10/2018   Lab Results  Component Value Date   HDL 50.60 03/09/2020   HDL 45 (L) 09/06/2019   HDL 41.70 08/10/2018   Lab Results  Component Value Date   LDLCALC 62 03/09/2020   LDLCALC 105 (H) 09/06/2019   LDLCALC 71 08/10/2018   Lab Results  Component Value Date   TRIG 144.0 03/09/2020   TRIG 152 (H) 09/06/2019   TRIG 155.0 (H) 08/10/2018   Lab Results  Component Value  Date   CHOLHDL 3 03/09/2020   CHOLHDL 3.9 09/06/2019   CHOLHDL 3 08/10/2018   Lab Results  Component Value Date   LDLDIRECT 86.0 09/11/2015   LDLDIRECT 160.7 09/20/2012   LDLDIRECT 180.3 05/16/2011   The ASCVD Risk score (Goff DC Jr., et al., 2013) failed to calculate for the following reasons:   The patient has a prior MI or stroke diagnosis I have reviewed the [place], Fam and Soc history. Patient Active Problem List   Diagnosis Date Noted  . Chronic kidney disease, stage 3, mod decreased GFR (HCC) 03/09/2020    Priority: High  . History of partial nephrectomy 09/08/2019    Priority: High    Right due to recurrent infections; now with only one functional kidney on left; done in the 70s.   . Diet-controlled diabetes mellitus ([place]) 03/04/2019    Priority: High  . Major depression, recurrent, chronic ([place]) 03/04/2019    Priority: High  . Mixed hyperlipidemia 03/04/2019    Priority: High  . Lumbar spinal stenosis 03/03/2019    Priority: High  . Class 3 obesity with alveolar hypoventilation, serious comorbidity, and body mass index (BMI) of 45.0 to 49.9 in adult [facility]) 11/21/2018    Priority: High  . Coronary artery disease involving native coronary artery of native heart with angina pectoris ([place]) 03/10/2016    Priority: High  . History of cardiac arrest 10/01/2015    Priority: High  . History of ST elevation myocardial infarction (STEMI) 10/01/2015    Priority: High    Study Conclusions 10/02/15:  - Left ventricle: The cavity size was normal. Wall thickness was   normal. Systolic function was normal. The estimated ejection   fraction was in the range of 60% to 65%. Wall motion was normal;   there were no regional wall motion abnormalities. - Aortic valve: There was mild regurgitation. - Right atrium: The atrium was mildly dilated. - Tricuspid valve: There was mild-moderate regurgitation.   . Osteoporosis 12/14/2006    Priority: High    DEXA 2018: T = -2.5. started on  prolia and f/u 2019 was improved T = -2.3 Dexa 2021: T = -2.3; will recheck 2 years; ca and vit D only for now.    . Hyperlipidemia associated with type 2 diabetes mellitus ([place]), LDL goal < 70, Rx Lipitor 09/22/2006    Priority: High  . Hypertension associated with diabetes ([place]) 09/22/2006    Priority: High  . Adenomatous polyp of colon 02/04/2004    Priority: High  . Laryngopharyngeal reflux (LPR) 09/06/2019    Priority: Medium  . Hyperparathyroidism, secondary ([place]) 07/06/2016    Priority: Medium  . COPD (chronic obstructive pulmonary disease) ([place]) 04/19/2016    Priority: Medium    identified on CT 04/17/16, former smoker; uses proair intermittently. Not on maintenance inhaler   . GERD (gastroesophageal reflux disease), Rx Protonix 02/14/2014    Priority: Medium  . Status post gastric bypass for obesity 02/14/2014  Priority: Medium  . Former smoker, 50 pack years, quit 2010 02/14/2014    Priority: Medium  . Anemia, B12 deficiency 04/04/2009    Priority: Medium  . Osteoarthritis, multiple sites 09/22/2006    Priority: Medium    Throughout back, knees, hands    . Degenerative joint disease (DJD) of lumbar spine 09/22/2006    Priority: Medium  . Bilateral lower extremity edema 11/21/2018    Priority: Low  . PVC's (premature ventricular contractions) 04/16/2017    Priority: Low    Holter 3/19: NSR, average heart rate 69, frequent PVCs (5% total beats), rare supraventricular ectopics, no AF/flutter     Social History: Patient  reports that she quit smoking about 5 years ago. Her smoking use included cigarettes. She has a 82.00 pack-year smoking history. She has never used smokeless tobacco. She reports that she does not drink alcohol and does not use drugs.  Review of Systems: Ophthalmic: negative for eye pain, loss of vision or double vision Cardiovascular: negative for chest pain Respiratory: negative for SOB or persistent cough Gastrointestinal: negative for  abdominal pain Genitourinary: negative for dysuria or gross hematuria MSK: negative for foot lesions Neurologic: negative for weakness or gait disturbance  Objective  Vitals: BP 130/66 (BP Location: Left Arm, Patient Position: Sitting, Cuff Size: Large)   Pulse (!) 58   Temp (!) 97.3 F (36.3 C) (Temporal)   Ht 5\' 2"  (1.575 m)   Wt 231 lb 6.1 oz (105 kg)   LMP  (LMP Unknown)   SpO2 96%   BMI 42.32 kg/m  General: well appearing, no acute distress  Psych:  Alert and oriented, normal mood and affect HEENT:  Normocephalic, atraumatic, moist mucous membranes, supple neck  Cardiovascular:  Nl S1 and S2, RRR without murmur, gallop or rub. no edema Respiratory:  Good breath sounds bilaterally, CTAB with normal effort, no rales Gastrointestinal: normal BS, soft, nontender Skin:  Warm, no rashes Neurologic:   Mental status is normal. normal gait Foot exam: no erythema, pallor, or cyanosis visible nl proprioception and sensation to monofilament testing bilaterally, +2 distal pulses bilaterally    Diabetic education: ongoing education regarding chronic disease management for diabetes was given today. We continue to reinforce the ABC's of diabetic management: A1c (<7 or 8 dependent upon patient), tight blood pressure control, and cholesterol management with goal LDL < 100 minimally. We discuss diet strategies, exercise recommendations, medication options and possible side effects. At each visit, we review recommended immunizations and preventive care recommendations for diabetics and stress that good diabetic control can prevent other problems. See below for this patient's data.    Commons side effects, risks, benefits, and alternatives for medications and treatment plan prescribed today were discussed, and the patient expressed understanding of the given instructions. Patient is instructed to call or message via MyChart if he/she has any questions or concerns regarding our treatment plan. No  barriers to understanding were identified. We discussed Red Flag symptoms and signs in detail. Patient expressed understanding regarding what to do in case of urgent or emergency type symptoms.   Medication list was reconciled, printed and provided to the patient in AVS. Patient instructions and summary information was reviewed with the patient as documented in the AVS. This note was prepared with assistance of Dragon voice recognition software. Occasional wrong-word or sound-a-like substitutions may have occurred due to the inherent limitations of voice recognition software  This visit occurred during the SARS-CoV-2 public health emergency.  Safety protocols were in place, including screening  questions prior to the visit, additional usage of staff PPE, and extensive cleaning of exam room while observing appropriate contact time as indicated for disinfecting solutions.

## 2020-06-08 ENCOUNTER — Encounter: Payer: Self-pay | Admitting: Family Medicine

## 2020-06-13 ENCOUNTER — Encounter: Payer: Self-pay | Admitting: Family Medicine

## 2020-06-14 ENCOUNTER — Other Ambulatory Visit: Payer: Self-pay

## 2020-06-14 ENCOUNTER — Other Ambulatory Visit: Payer: Self-pay | Admitting: Family Medicine

## 2020-06-14 DIAGNOSIS — G8929 Other chronic pain: Secondary | ICD-10-CM

## 2020-06-14 DIAGNOSIS — M5441 Lumbago with sciatica, right side: Secondary | ICD-10-CM

## 2020-06-21 ENCOUNTER — Encounter: Payer: PPO | Admitting: Gastroenterology

## 2020-08-14 ENCOUNTER — Other Ambulatory Visit: Payer: Self-pay | Admitting: Family Medicine

## 2020-08-14 ENCOUNTER — Other Ambulatory Visit: Payer: Self-pay | Admitting: Physician Assistant

## 2020-08-22 ENCOUNTER — Ambulatory Visit (INDEPENDENT_AMBULATORY_CARE_PROVIDER_SITE_OTHER): Payer: PPO | Admitting: Family Medicine

## 2020-08-22 ENCOUNTER — Other Ambulatory Visit: Payer: Self-pay

## 2020-08-22 ENCOUNTER — Encounter: Payer: Self-pay | Admitting: Family Medicine

## 2020-08-22 ENCOUNTER — Telehealth: Payer: Self-pay

## 2020-08-22 VITALS — BP 108/70 | HR 65 | Temp 97.8°F | Wt 228.8 lb

## 2020-08-22 DIAGNOSIS — E1121 Type 2 diabetes mellitus with diabetic nephropathy: Secondary | ICD-10-CM

## 2020-08-22 DIAGNOSIS — J449 Chronic obstructive pulmonary disease, unspecified: Secondary | ICD-10-CM

## 2020-08-22 DIAGNOSIS — G8929 Other chronic pain: Secondary | ICD-10-CM | POA: Diagnosis not present

## 2020-08-22 DIAGNOSIS — M8949 Other hypertrophic osteoarthropathy, multiple sites: Secondary | ICD-10-CM | POA: Diagnosis not present

## 2020-08-22 DIAGNOSIS — Z905 Acquired absence of kidney: Secondary | ICD-10-CM | POA: Diagnosis not present

## 2020-08-22 DIAGNOSIS — E1129 Type 2 diabetes mellitus with other diabetic kidney complication: Secondary | ICD-10-CM | POA: Insufficient documentation

## 2020-08-22 DIAGNOSIS — N1831 Chronic kidney disease, stage 3a: Secondary | ICD-10-CM | POA: Diagnosis not present

## 2020-08-22 DIAGNOSIS — E119 Type 2 diabetes mellitus without complications: Secondary | ICD-10-CM | POA: Insufficient documentation

## 2020-08-22 DIAGNOSIS — M48062 Spinal stenosis, lumbar region with neurogenic claudication: Secondary | ICD-10-CM

## 2020-08-22 DIAGNOSIS — M159 Polyosteoarthritis, unspecified: Secondary | ICD-10-CM

## 2020-08-22 DIAGNOSIS — M150 Primary generalized (osteo)arthritis: Secondary | ICD-10-CM

## 2020-08-22 DIAGNOSIS — M5441 Lumbago with sciatica, right side: Secondary | ICD-10-CM | POA: Diagnosis not present

## 2020-08-22 MED ORDER — HYDROCODONE-ACETAMINOPHEN 10-325 MG PO TABS: 1.0000 | ORAL_TABLET | Freq: Every day | ORAL | 0 refills | Status: DC | PRN

## 2020-08-22 MED ORDER — DAPAGLIFLOZIN PROPANEDIOL 10 MG PO TABS: 10.0000 mg | ORAL_TABLET | Freq: Every day | ORAL | 11 refills | Status: DC

## 2020-08-22 MED ORDER — GABAPENTIN 300 MG PO CAPS: 300.0000 mg | ORAL_CAPSULE | Freq: Three times a day (TID) | ORAL | 5 refills | Status: DC

## 2020-08-22 MED ORDER — ATROVENT HFA 17 MCG/ACT IN AERS: 2.0000 | INHALATION_SPRAY | Freq: Two times a day (BID) | RESPIRATORY_TRACT | 12 refills | Status: DC

## 2020-08-22 NOTE — Telephone Encounter (Signed)
Pt called and stated that the Hydrocodone that Dr Jonni Sanger called in today (7/20) needs prior authorization before the insurance would be able to cover it

## 2020-08-22 NOTE — Progress Notes (Signed)
Subjective  CC:  Chief Complaint  Patient presents with   Referral    Requesting referral to est with nephrology    Medication Problem    Not taking gabapentin - was not helping Spriva was too expensive     HPI: Tricia [name] is a 75 y.o. female who presents to the office today for follow up of diabetes and problems listed above in the chief complaint.  Diabetes follow up: Her diabetic control is reported as Worse. She reports fastings > 115 on avg, sometimes as high as 160. Eats low carbs but trying to balance with low protein for renal disease. No sxs of hyperglycemia. She has been diet controlled. She denies exertional CP or SOB or symptomatic hypoglycemia. She denies foot sores or paresthesias. Has read about renal protective benefits of farxiga.  CKD: most recent GFR was 4.56 with creat of 1.1; h/o partial nephrectomy.  Lumbar spinal stenosis with chronic low back pain; mostly with standing or walking. Low dose gabapentin did not help much. Avoids norco much because she thought it had an nsaid in it. It does help. Less strong meds were not helpful. Has seen ortho: recommend fusion or injections. Hasn't had PT. No new sxs. No bowel/bladder involvement.  Weight is down COPD: couldn't afford sprivia. Feels breathing is stable. But does have tightness or wheezing that can limit function. Copd by imaging study.   Wt Readings from Last 3 Encounters:  08/22/20 228 lb 12.8 oz (103.8 kg)  06/07/20 231 lb 6.1 oz (105 kg)  05/02/20 238 lb (108 kg)    BP Readings from Last 3 Encounters:  08/22/20 108/70  06/07/20 130/66  05/02/20 119/66    Assessment  1. Type 2 diabetes with nephropathy (HCC)   2. Stage 3a chronic kidney disease ([place])   3. History of partial nephrectomy   4. Chronic obstructive pulmonary disease, unspecified COPD type ([place])   5. Chronic low back pain with right-sided sciatica, unspecified back pain laterality   6. Primary osteoarthritis involving multiple joints    7. Spinal stenosis of lumbar region with neurogenic claudication      Plan  Diabetes is currently adequately controlled. Will add farxiga. Renal protective. Education on expectations and use. Has f/u in one month CKD: mild. Recheck next visit. Avoid nephrotoxins.  Copd: trial of atrovent for symptomatic improvement. Pt declines PFTs Chronic low back pain: PT, increase dose of gabapentin, liberalize norco use as needed. (Uses very rarely now).   Follow up: No follow-ups on file.. Orders Placed This Encounter  Procedures   Ambulatory referral to Physical Therapy   Meds ordered this encounter  Medications   dapagliflozin propanediol (FARXIGA) 10 MG TABS tablet    Sig: Take 1 tablet (10 mg total) by mouth daily.    Dispense:  30 tablet    Refill:  11   gabapentin (NEURONTIN) 300 MG capsule    Sig: Take 1 capsule (300 mg total) by mouth 3 (three) times daily.    Dispense:  90 capsule    Refill:  5   ipratropium (ATROVENT HFA) 17 MCG/ACT inhaler    Sig: Inhale 2 puffs into the lungs in the morning and at bedtime.    Dispense:  1 each    Refill:  12   HYDROcodone-acetaminophen (NORCO) 10-325 MG tablet    Sig: Take 1 tablet by mouth daily as needed.    Dispense:  30 tablet    Refill:  0      Immunization History  Administered Date(s) Administered   Fluad Quad(high Dose 65+) 10/07/2018, 10/18/2019   Influenza Split 01/21/2011   Influenza Whole 11/19/2007   Influenza, High Dose Seasonal PF 10/16/2015, 11/12/2016, 11/07/2017   Influenza-Unspecified 12/18/2013   PFIZER Comirnaty(Gray Top)Covid-19 Tri-Sucrose Vaccine 05/31/2020   PFIZER(Purple Top)SARS-COV-2 Vaccination 03/17/2019, 04/11/2019, 11/03/2019   Pneumococcal Conjugate-13 08/26/2013   Pneumococcal Polysaccharide-23 02/04/2003, 06/08/2015   Td 02/03/2005   Zoster Recombinat (Shingrix) 02/18/2017, 07/19/2017    Diabetes Related Lab Review: Lab Results  Component Value Date   HGBA1C 6.7 (H) 06/07/2020   HGBA1C 6.5  (A) 03/09/2020   HGBA1C 6.3 (A) 09/06/2019    Lab Results  Component Value Date   MICROALBUR 1.4 09/06/2019   Lab Results  Component Value Date   CREATININE 1.19 06/07/2020   BUN 30 (H) 06/07/2020   NA 143 06/07/2020   K 4.4 06/07/2020   CL 105 06/07/2020   CO2 29 06/07/2020   Lab Results  Component Value Date   CHOL 142 03/09/2020   CHOL 177 09/06/2019   CHOL 143 08/10/2018   Lab Results  Component Value Date   HDL 50.60 03/09/2020   HDL 45 (L) 09/06/2019   HDL 41.70 08/10/2018   Lab Results  Component Value Date   LDLCALC 62 03/09/2020   LDLCALC 105 (H) 09/06/2019   LDLCALC 71 08/10/2018   Lab Results  Component Value Date   TRIG 144.0 03/09/2020   TRIG 152 (H) 09/06/2019   TRIG 155.0 (H) 08/10/2018   Lab Results  Component Value Date   CHOLHDL 3 03/09/2020   CHOLHDL 3.9 09/06/2019   CHOLHDL 3 08/10/2018   Lab Results  Component Value Date   LDLDIRECT 86.0 09/11/2015   LDLDIRECT 160.7 09/20/2012   LDLDIRECT 180.3 05/16/2011   The ASCVD Risk score (Goff DC Jr., et al., 2013) failed to calculate for the following reasons:   The patient has a prior MI or stroke diagnosis I have reviewed the [place], Fam and Soc history. Patient Active Problem List   Diagnosis Date Noted   Chronic kidney disease, stage 3, mod decreased GFR ([place]) 03/09/2020    Priority: High   History of partial nephrectomy 09/08/2019    Priority: High    Right due to recurrent infections; now with only one functional kidney on left; done in the 70s.     Diet-controlled diabetes mellitus ([place]) 03/04/2019    Priority: High   Major depression, recurrent, chronic ([place]) 03/04/2019    Priority: High   Mixed hyperlipidemia 03/04/2019    Priority: High   Lumbar spinal stenosis 03/03/2019    Priority: High   Class 3 obesity with alveolar hypoventilation, serious comorbidity, and body mass index (BMI) of 45.0 to 49.9 in adult [facility]) 11/21/2018    Priority: High   Coronary artery disease  involving native coronary artery of native heart with angina pectoris ([place]) 03/10/2016    Priority: High   History of cardiac arrest 10/01/2015    Priority: High   History of ST elevation myocardial infarction (STEMI) 10/01/2015    Priority: High    Study Conclusions 10/02/15:   - Left ventricle: The cavity size was normal. Wall thickness was   normal. Systolic function was normal. The estimated ejection   fraction was in the range of 60% to 65%. Wall motion was normal;   there were no regional wall motion abnormalities. - Aortic valve: There was mild regurgitation. - Right atrium: The atrium was mildly dilated. - Tricuspid valve: There was mild-moderate regurgitation.  Osteoporosis 12/14/2006    Priority: High    DEXA 2018: T = -2.5. started on prolia and f/u 2019 was improved T = -2.3 Dexa 2021: T = -2.3; will recheck 2 years; ca and vit D only for now.      Hyperlipidemia associated with type 2 diabetes mellitus ([place]), LDL goal < 70, Rx Lipitor 09/22/2006    Priority: High   Hypertension associated with diabetes ([place]) 09/22/2006    Priority: High   Adenomatous polyp of colon 02/04/2004    Priority: High   Laryngopharyngeal reflux (LPR) 09/06/2019    Priority: Medium   Hyperparathyroidism, secondary ([place]) 07/06/2016    Priority: Medium   COPD (chronic obstructive pulmonary disease) ([place]) 04/19/2016    Priority: Medium    identified on CT 04/17/16, former smoker; uses proair intermittently. Not on maintenance inhaler     GERD (gastroesophageal reflux disease), Rx Protonix 02/14/2014    Priority: Medium   Status post gastric bypass for obesity 02/14/2014    Priority: Medium   Former smoker, 50 pack years, quit 2010 02/14/2014    Priority: Medium   Anemia, B12 deficiency 04/04/2009    Priority: Medium   Osteoarthritis, multiple sites 09/22/2006    Priority: Medium    Throughout back, knees, hands     Degenerative joint disease (DJD) of lumbar spine 09/22/2006     Priority: Medium   Bilateral lower extremity edema 11/21/2018    Priority: Low   PVC's (premature ventricular contractions) 04/16/2017    Priority: Low    Holter 3/19: NSR, average heart rate 69, frequent PVCs (5% total beats), rare supraventricular ectopics, no AF/flutter    Type 2 diabetes with nephropathy ([place]) 08/22/2020    Social History: Patient  reports that she quit smoking about 5 years ago. Her smoking use included cigarettes. She has a 82.00 pack-year smoking history. She has never used smokeless tobacco. She reports that she does not drink alcohol and does not use drugs.  Review of Systems: Ophthalmic: negative for eye pain, loss of vision or double vision Cardiovascular: negative for chest pain Respiratory: negative for SOB or persistent cough Gastrointestinal: negative for abdominal pain Genitourinary: negative for dysuria or gross hematuria MSK: negative for foot lesions Neurologic: negative for weakness or gait disturbance  Objective  Vitals: BP 108/70   Pulse 65   Temp 97.8 F (36.6 C) (Temporal)   Wt 228 lb 12.8 oz (103.8 kg)   LMP  (LMP Unknown)   SpO2 97%   BMI 41.85 kg/m  General: well appearing, no acute distress  Psych:  Alert and oriented, normal mood and affect Trace edema    Diabetic education: ongoing education regarding chronic disease management for diabetes was given today. We continue to reinforce the ABC's of diabetic management: A1c (<7 or 8 dependent upon patient), tight blood pressure control, and cholesterol management with goal LDL < 100 minimally. We discuss diet strategies, exercise recommendations, medication options and possible side effects. At each visit, we review recommended immunizations and preventive care recommendations for diabetics and stress that good diabetic control can prevent other problems. See below for this patient's data.   Commons side effects, risks, benefits, and alternatives for medications and treatment plan  prescribed today were discussed, and the patient expressed understanding of the given instructions. Patient is instructed to call or message via MyChart if he/she has any questions or concerns regarding our treatment plan. No barriers to understanding were identified. We discussed Red Flag symptoms and signs in detail. Patient expressed  understanding regarding what to do in case of urgent or emergency type symptoms.  Medication list was reconciled, printed and provided to the patient in AVS. Patient instructions and summary information was reviewed with the patient as documented in the AVS. This note was prepared with assistance of Dragon voice recognition software. Occasional wrong-word or sound-a-like substitutions may have occurred due to the inherent limitations of voice recognition software  This visit occurred during the SARS-CoV-2 public health emergency.  Safety protocols were in place, including screening questions prior to the visit, additional usage of staff PPE, and extensive cleaning of exam room while observing appropriate contact time as indicated for disinfecting solutions.

## 2020-08-22 NOTE — Patient Instructions (Signed)
Please follow up as scheduled for your next visit with me: 09/12/2020   We will start [place].  Let me know if you have any problems taking it.   I have ordered atrovent for copd, printed Norco for pain to be refilled when needed, and ordered Physical therapy. We will call you to get you scheduled here.  Also, I have increased the dose of the gabapentin to 300mg  3x/day. This may help your back pain as well. I have sent this into [facility] for you.    If you have any questions or concerns, please don't hesitate to send me a message via MyChart or call the office at [phone]. Thank you for visiting with [place] today! It's our pleasure caring for you.

## 2020-08-24 NOTE — Telephone Encounter (Signed)
Patient has called in requesting status update

## 2020-08-28 ENCOUNTER — Other Ambulatory Visit: Payer: Self-pay

## 2020-08-28 MED ORDER — DAPAGLIFLOZIN PROPANEDIOL 10 MG PO TABS: 10.0000 mg | ORAL_TABLET | Freq: Every day | ORAL | 3 refills | Status: DC

## 2020-08-28 NOTE — Telephone Encounter (Signed)
Katie, can you look into this.

## 2020-08-28 NOTE — Telephone Encounter (Signed)
PA approved.

## 2020-08-28 NOTE — Telephone Encounter (Signed)
PA submitted via CoverMyMeds.

## 2020-09-05 ENCOUNTER — Other Ambulatory Visit: Payer: Self-pay

## 2020-09-05 ENCOUNTER — Ambulatory Visit: Payer: PPO | Admitting: Physical Therapy

## 2020-09-05 DIAGNOSIS — M5441 Lumbago with sciatica, right side: Secondary | ICD-10-CM

## 2020-09-05 DIAGNOSIS — G8929 Other chronic pain: Secondary | ICD-10-CM

## 2020-09-05 NOTE — Patient Instructions (Signed)
Access Code: [email or web address] URL: [email or web address] Date: 09/05/2020 Prepared by: Lyndee Hensen  Exercises Supine Posterior Pelvic Tilt - 2 x daily - 1 sets - 10 reps - 5 hold Single Knee to Chest Stretch - 2 x daily - 3 reps - 30 hold Supine Piriformis Stretch Pulling Heel to Hip - 2 x daily - 3 reps - 30 hold Supine Transversus Abdominis Bracing - Hands on Ground - 2 x daily - 1 sets - 10 reps Supine March - 2 x daily - 2 sets - 10 reps

## 2020-09-06 ENCOUNTER — Ambulatory Visit (INDEPENDENT_AMBULATORY_CARE_PROVIDER_SITE_OTHER): Payer: PPO

## 2020-09-06 DIAGNOSIS — Z0000 Encounter for general adult medical examination without abnormal findings: Secondary | ICD-10-CM | POA: Diagnosis not present

## 2020-09-06 NOTE — Progress Notes (Signed)
Virtual Visit via Telephone Note  I connected with  Tricia [name] on 09/06/20 at  8:00 AM EDT by telephone and verified that I am speaking with the correct person using two identifiers.  Medicare Annual Wellness visit completed telephonically due to Covid-19 pandemic.   Persons participating in this call: This Health Coach and this patient.   Location: Patient: Home  Provider: Office   I discussed the limitations, risks, security and privacy concerns of performing an evaluation and management service by telephone and the availability of in person appointments. The patient expressed understanding and agreed to proceed.  Unable to perform video visit due to video visit attempted and failed and/or patient does not have video capability.   Some vital signs may be absent or patient reported.   Willette Brace, LPN   Subjective:   Tricia [name] is a 75 y.o. female who presents for Medicare Annual (Subsequent) preventive examination.  Review of Systems     Cardiac Risk Factors include: advanced age (>73mn, >>58women);hypertension;diabetes mellitus;dyslipidemia;obesity (BMI >30kg/m2)     Objective:    There were no vitals filed for this visit. There is no height or weight on file to calculate BMI.  Advanced Directives 09/06/2020 09/01/2019 09/02/2018 07/27/2018 09/07/2017 07/17/2017 04/29/2017  Does Patient Have a Medical Advance Directive? Yes Yes Yes No No;Yes No No  Type of Advance Directive Living will [place] - Living will - -  Does patient want to make changes to medical advance directive? - - Yes (MAU/Ambulatory/Procedural Areas - Information given) - No - Patient declined - -  Copy of [place] Chart? - No - copy requested No - copy requested - - - -  Would patient like information on creating a medical advance directive? - - - - No - Patient declined - -    Current Medications  (verified) Outpatient Encounter Medications as of 09/06/2020  Medication Sig   amLODipine (NORVASC) 10 MG tablet Take 1 tablet by mouth daily. **Dose increase**   aspirin EC 81 MG tablet Take 81 mg by mouth daily.   atorvastatin (LIPITOR) 80 MG tablet Take 1 tablet by mouth daily.   baclofen (LIORESAL) 10 MG tablet Take 1 tablet by mouth twice daily.   carvedilol (COREG) 6.25 MG tablet Take 1 tablet by mouth twice daily with a meal.   Cholecalciferol (VITAMIN D3 SUPER STRENGTH) 50 MCG (2000 UT) TABS Take 1 tablet by mouth daily.   clopidogrel (PLAVIX) 75 MG tablet Take 1 tablet by mouth daily.   dapagliflozin propanediol (FARXIGA) 10 MG TABS tablet Take 1 tablet (10 mg total) by mouth daily.   ezetimibe (ZETIA) 10 MG tablet Take 1 tablet by mouth daily.   famotidine (PEPCID) 40 MG tablet Take 1 tablet by mouth at bedtime.   furosemide (LASIX) 20 MG tablet Take 1-2 tablets (20-40 mg total) by mouth as needed for fluid or edema.   gabapentin (NEURONTIN) 300 MG capsule Take 1 capsule (300 mg total) by mouth 3 (three) times daily.   glucose blood (FREESTYLE LITE) test strip TEST UP TO FOUR TIMES DAILY Dx: E11.22   HYDROcodone-acetaminophen (NORCO) 10-325 MG tablet Take 1 tablet by mouth daily as needed.   ipratropium (ATROVENT HFA) 17 MCG/ACT inhaler Inhale 2 puffs into the lungs in the morning and at bedtime.   Lancets (FREESTYLE) lancets TEST UP TO FOUR TIMES DAILY Dx: E11.22   nitroGLYCERIN (NITROLINGUAL) 0.4 MG/SPRAY spray Place 1  spray under the tongue every 5 (five) minutes x 3 doses as needed for chest pain.   pantoprazole (PROTONIX) 40 MG tablet Take 1 tablet by mouth twice daily before meals.   PROAIR HFA 108 (90 Base) MCG/ACT inhaler Inhale 2 puffs into the lungs every 6 (six) hours as needed for wheezing or shortness of breath.   valsartan (DIOVAN) 320 MG tablet Take 1 tablet by mouth daily.   [DISCONTINUED] sertraline (ZOLOFT) 50 MG tablet Take 1 tablet by mouth at bedtime.   No  facility-administered encounter medications on file as of 09/06/2020.    Allergies (verified) Sulfa antibiotics   History: Past Medical History:  Diagnosis Date   Adenomatous polyp of colon 02/2004   Anemia    Anxiety    Arthritis    B12 deficiency    CAD (coronary artery disease)    a. s/p remote BMS to LAD;  b. 09/2015 Inf STEMI/VF Arrest: LM nl, LAD 85p (staged PCI 2 days later w/ 3.0x32 Synergy DES), 40p/m, 25d, LCX 63m RCA 80ost/100p (2.75x32 Synergy DES), 648mEF 55-65%. // c. Myoview 2/18: EF 59, no ischemia or infarction; Normal study // Myoview 3/22: EF 70, no ischemia or infarction; low risk   Chronic kidney disease, stage 3, mod decreased GFR (HCC) 03/09/2020   Dermatophytosis of groin and perianal area    Diet Controlled Diabetes Mellitus    Diverticulosis    GERD (gastroesophageal reflux disease)    H/O echocardiogram    a. 09/2015 Echo: EF 60-65%, no rwma, mild AI, mildly dil RA, mild to mod TR.   History of echocardiogram    Echo 3/22: EF 60-65, no RWMA, GR 1 DD, normal RVSF, mild LAE, RVSP 35.5, mild to moderate TR, trivial AI   Hyperlipidemia    Hypertensive heart disease    Low back pain    l5 disc   Lumbar spinal stenosis 03/03/2019   Morbid obesity ([place]   Osteoporosis    PVC's (premature ventricular contractions) 04/16/2017   Holter 3/19: NSR, average heart rate 69, frequent PVCs (5% total beats), rare supraventricular ectopics, no AF/flutter   Sleep apnea 10/03/2009   Resolved after gastric bypass     TOBACCO ABUSE 10/05/2009   Qualifier: Diagnosis of  By: CrStanford BreedMD, FAKandyce Rud  Ventricular fibrillation (HCLanghorne Manor08/28/2017   a. In setting of inferior STEMI.   Past Surgical History:  Procedure Laterality Date   ABDOMINAL HYSTERECTOMY     APPENDECTOMY     BARIATRIC SURGERY     CARDIAC CATHETERIZATION N/A 10/01/2015   Procedure: Left Heart Cath and Coronary Angiography;  Surgeon: DaLeonie ManMD;  [facility];  Service:  Cardiovascular;  Laterality: N/A;   CARDIAC CATHETERIZATION N/A 10/01/2015   Procedure: Coronary Stent Intervention;  Surgeon: DaLeonie ManMD;  [facility];  Service: Cardiovascular;  Laterality: N/A;   CARDIAC CATHETERIZATION N/A 10/03/2015   Procedure: Coronary Stent Intervention;  Surgeon: ThTroy SineMD;  [facility];  Service: Cardiovascular;  Laterality: N/A;   CARDIAC CATHETERIZATION N/A 10/03/2015   Procedure: Coronary/Graft Angiography;  Surgeon: ThTroy SineMD;  [facility];  Service: Cardiovascular;  Laterality: N/A;   CARPAL TUNNEL RELEASE Bilateral    CERVICAL DISC SURGERY     CHOLECYSTECTOMY     CORONARY ANGIOPLASTY  2002   HERNIA REPAIR     LUMBAR LAMINECTOMY     L3-4   NEPHRECTOMY Right  Partial   TONSILLECTOMY     TOTAL KNEE ARTHROPLASTY Left    TUBAL LIGATION     ULNAR NERVE REPAIR Left    Family History  Problem Relation Age of Onset   Colitis Mother        sepsis from c dif colitis   Heart disease Father    Lung cancer Maternal Uncle    Stomach cancer Maternal Aunt    Lung cancer Maternal Uncle    Colon cancer Neg Hx    Esophageal cancer Neg Hx    Rectal cancer Neg Hx    Social History   Socioeconomic History   Marital status: Widowed    Spouse name: Not on file   Number of children: Not on file   Years of education: Not on file   Highest education level: Not on file  Occupational History   Occupation: retired    Fish farm manager: UNEMPLOYED  Tobacco Use   Smoking status: Former    Packs/day: 2.00    Years: 41.00    Pack years: 82.00    Types: Cigarettes    Quit date: 03/07/2015    Years since quitting: 5.5   Smokeless tobacco: Never  Vaping Use   Vaping Use: Never used  Substance and Sexual Activity   Alcohol use: No    Alcohol/week: 0.0 standard drinks   Drug use: No   Sexual activity: Yes  Other Topics Concern   Not on file  Social History Narrative   Widowed in 04/2012. 2  children. 3 grandkids. Lives alone. Completely indendent. Disabled/retired. Disabled-lifted computer paper. Hobbies: spend money-gamble.   Social Determinants of Health   Financial Resource Strain: Low Risk    Difficulty of Paying Living Expenses: Not hard at all  Food Insecurity: No Food Insecurity   Worried About Charity fundraiser in the Last Year: Never true   [place] in the Last Year: Never true  Transportation Needs: No Transportation Needs   Lack of Transportation (Medical): No   Lack of Transportation (Non-Medical): No  Physical Activity: Sufficiently Active   Days of Exercise per Week: 4 days   Minutes of Exercise per Session: 40 min  Stress: No Stress Concern Present   Feeling of Stress : Not at all  Social Connections: Socially Isolated   Frequency of Communication with Friends and Family: More than three times a week   Frequency of Social Gatherings with Friends and Family: Twice a week   Attends Religious Services: Never   Marine scientist or Organizations: No   Attends Archivist Meetings: Never   Marital Status: Widowed    Tobacco Counseling Counseling given: Not Answered   Clinical Intake:  Pre-visit preparation completed: Yes  Pain : No/denies pain     BMI - recorded: 41.85 Nutritional Status: BMI > 30  Obese Nutritional Risks: None Diabetes: Yes CBG done?: Yes (117) CBG resulted in Enter/ Edit results?: No Did pt. bring in CBG monitor from home?: No  How often do you need to have someone help you when you read instructions, pamphlets, or other written materials from your doctor or pharmacy?: 1 - Never  Diabetic?Nutrition Risk Assessment:  Has the patient had any N/V/D within the last 2 months?  No  Does the patient have any non-healing wounds?  No  Has the patient had any unintentional weight loss or weight gain?  No   Diabetes:  Is the patient diabetic?  Yes  If diabetic, was a CBG obtained  today?  Yes  Did the  patient bring in their glucometer from home?  No  How often do you monitor your CBG's? daily.   Financial Strains and Diabetes Management:  Are you having any financial strains with the device, your supplies or your medication? No .  Does the patient want to be seen by Chronic Care Management for management of their diabetes?  No  Would the patient like to be referred to a Nutritionist or for Diabetic Management?  No   Diabetic Exams:  Diabetic Eye Exam: Completed 09/14/19 Diabetic Foot Exam: Completed 03/09/20   Interpreter Needed?: No  Information entered by :: Charlott Rakes, LPN   Activities of Daily Living In your present state of health, do you have any difficulty performing the following activities: 09/06/2020  Hearing? N  Vision? N  Difficulty concentrating or making decisions? N  Walking or climbing stairs? Y  Dressing or bathing? N  Doing errands, shopping? N  Preparing Food and eating ? N  Using the Toilet? N  In the past six months, have you accidently leaked urine? Y  Comment wears a exlarge always  Do you have problems with loss of bowel control? Y  Managing your Medications? N  Managing your Finances? N  Housekeeping or managing your Housekeeping? N  Some recent data might be hidden    Patient Care Team: Leamon Arnt, MD as PCP - General (Family Medicine) Sherren Mocha, MD as PCP - Cardiology (Cardiology) Ladene Artist, MD as Consulting Physician (Gastroenterology) Sharmon Revere as Physician Assistant (Cardiology) Gardiner Barefoot, DPM as Consulting Physician (Podiatry) Sherren Mocha, MD as Consulting Physician (Cardiology) Jovita Gamma, MD as Consulting Physician (Neurosurgery) Madelin Rear, [facility] as Pharmacist (Pharmacist)  Indicate any recent Medical Services you may have received from other than [facility] providers in the past year (date may be approximate).     Assessment:   This is a routine wellness examination for  Tricia [name].  Hearing/Vision screen Hearing Screening - Comments:: Pt denies any hearing issues  Vision Screening - Comments:: Pt [facility] for [facility] exams   Dietary issues and exercise activities discussed: Current Exercise Habits: Home exercise routine, Type of exercise: Other - see comments (recumbant bike), Time (Minutes): 40, Frequency (Times/Week): 4, Weekly Exercise (Minutes/Week): 160   Goals Addressed             This Visit's Progress    Patient Stated       Lose weight        Depression Screen PHQ 2/9 Scores 09/06/2020 03/09/2020 09/06/2019 09/01/2019 04/15/2019 08/26/2018 07/03/2017  PHQ - 2 Score 0 0 0 0 1 0 0  PHQ- 9 Score - 0 - - 4 3 -    Fall Risk Fall Risk  09/06/2020 09/06/2019 09/01/2019 09/02/2018 07/03/2017  Falls in the past year? 0 0 0 0 No  Number falls in past yr: 0 0 0 0 -  Injury with Fall? 0 0 0 0 -  Risk for fall due to : Impaired balance/gait;Impaired vision - Impaired vision;Impaired balance/gait;Impaired mobility Impaired balance/gait -  Follow up Falls prevention discussed - Falls prevention discussed Falls evaluation completed -    FALL RISK PREVENTION PERTAINING TO THE HOME:  Any stairs in or around the home? No  If so, are there any without handrails? No  Home free of loose throw rugs in walkways, pet beds, electrical cords, etc? Yes  Adequate lighting in your home to reduce risk of falls? Yes  ASSISTIVE DEVICES UTILIZED TO PREVENT FALLS:  Life alert? Yes apple watch  Use of a cane, walker or w/c? Yes  Grab bars in the bathroom? Yes  Shower chair or bench in shower? Yes  Elevated toilet seat or a handicapped toilet? Yes   TIMED UP AND GO:  Was the test performed? No .     Cognitive Function: MMSE - Mini Mental State Exam 07/17/2017 06/22/2015  Not completed: (No Data) (No Data)     6CIT Screen 09/06/2020 09/01/2019  What Year? 0 points 0 points  What month? 0 points 0 points  What time? 0 points 0 points  Count  back from 20 0 points 0 points  Months in reverse 0 points 0 points  Repeat phrase 0 points 0 points  Total Score 0 0    Immunizations Immunization History  Administered Date(s) Administered   Fluad Quad(high Dose 65+) 10/07/2018, 10/18/2019   Influenza Split 01/21/2011   Influenza Whole 11/19/2007   Influenza, High Dose Seasonal PF 10/16/2015, 11/12/2016, 11/07/2017   Influenza-Unspecified 12/18/2013   PFIZER Comirnaty(Gray Top)Covid-19 Tri-Sucrose Vaccine 05/31/2020   PFIZER(Purple Top)SARS-COV-2 Vaccination 03/17/2019, 04/11/2019, 11/03/2019   Pneumococcal Conjugate-13 08/26/2013   Pneumococcal Polysaccharide-23 02/04/2003, 06/08/2015   Td 02/03/2005   Zoster Recombinat (Shingrix) 02/18/2017, 07/19/2017    TDAP status: Up to date  Flu Vaccine status: Due, Education has been provided regarding the importance of this vaccine. Advised may receive this vaccine at local pharmacy or Health Dept. Aware to provide a copy of the vaccination record if obtained from local pharmacy or Health Dept. Verbalized acceptance and understanding.  Pneumococcal vaccine status: Up to date  Covid-19 vaccine status: Completed vaccines  Qualifies for Shingles Vaccine? Yes   Zostavax completed Yes   Shingrix Completed?: Yes  Screening Tests Health Maintenance  Topic Date Due   INFLUENZA VACCINE  09/03/2020   OPHTHALMOLOGY EXAM  09/13/2020   HEMOGLOBIN A1C  12/08/2020   FOOT EXAM  03/09/2021   MAMMOGRAM  04/23/2021   DEXA SCAN  09/14/2021   COLONOSCOPY (Pts 45-33yr Insurance coverage will need to be confirmed)  05/03/2023   COVID-19 Vaccine  Completed   Hepatitis C Screening  Completed   PNA vac Low Risk Adult  Completed   Zoster Vaccines- Shingrix  Completed   HPV VACCINES  Aged Out   TETANUS/TDAP  Discontinued    Health Maintenance  Health Maintenance Due  Topic Date Due   INFLUENZA VACCINE  09/03/2020    Colorectal cancer screening: Type of screening: Colonoscopy. Completed  05/02/20. Repeat every 3 years  Mammogram status: Completed 04/23/20. Repeat every year  Bone Density status: Completed 09/15/19. Results reflect: Bone density results: OSTEOPOROSIS. Repeat every 2 years.   Additional Screening:  Hepatitis C Screening: Completed 07/27/15  Vision Screening: Recommended annual ophthalmology exams for early detection of glaucoma and other disorders of the eye. Is the patient up to date with their annual eye exam?  Yes  Who is the provider or what is the name of the office in which the patient attends annual eye exams? [facility]  If pt is not established with a provider, would they like to be referred to a provider to establish care? No .   Dental Screening: Recommended annual dental exams for proper oral hygiene  Community Resource Referral / Chronic Care Management: CRR required this visit?  No   CCM required this visit?  No      Plan:     I have personally reviewed and noted the  following in the patient's chart:   Medical and social history Use of alcohol, tobacco or illicit drugs  Current medications and supplements including opioid prescriptions.  Functional ability and status Nutritional status Physical activity Advanced directives List of other physicians Hospitalizations, surgeries, and ER visits in previous 12 months Vitals Screenings to include cognitive, depression, and falls Referrals and appointments  In addition, I have reviewed and discussed with patient certain preventive protocols, quality metrics, and best practice recommendations. A written personalized care plan for preventive services as well as general preventive health recommendations were provided to patient.     Willette Brace, LPN   [id number]   Nurse Notes: None

## 2020-09-06 NOTE — Patient Instructions (Signed)
Tricia [name] , Thank you for taking time to come for your Medicare Wellness Visit. I appreciate your ongoing commitment to your health goals. Please review the following plan we discussed and let me know if I can assist you in the future.   Screening recommendations/referrals: Colonoscopy: Done 05/02/20 repeat in 3 years 05/03/23 Mammogram: Done 04/23/20 repeat every year Bone Density: Done 09/15/19 repeat every 2 years  Recommended yearly ophthalmology/optometry visit for glaucoma screening and checkup Recommended yearly dental visit for hygiene and checkup  Vaccinations: Influenza vaccine: Due and discussed Pneumococcal vaccine: Completed  Tdap vaccine: Discontinued  Shingles vaccine: Completed 1/16, & 07/19/17   Covid-19:Completed 2/11, 3/8, 11/03/19 & 05/31/20  Advanced directives: Please bring a copy of your health care power of attorney and living will to the office at your convenience.  Conditions/risks identified: Lose weight   Next appointment: Follow up in one year for your annual wellness visit    Preventive Care 65 Years and Older, Female Preventive care refers to lifestyle choices and visits with your health care provider that can promote health and wellness. What does preventive care include? A yearly physical exam. This is also called an annual well check. Dental exams once or twice a year. Routine eye exams. Ask your health care provider how often you should have your eyes checked. Personal lifestyle choices, including: Daily care of your teeth and gums. Regular physical activity. Eating a healthy diet. Avoiding tobacco and drug use. Limiting alcohol use. Practicing safe sex. Taking low-dose aspirin every day. Taking vitamin and mineral supplements as recommended by your health care provider. What happens during an annual well check? The services and screenings done by your health care provider during your annual well check will depend on your age, overall health,  lifestyle risk factors, and family history of disease. Counseling  Your health care provider may ask you questions about your: Alcohol use. Tobacco use. Drug use. Emotional well-being. Home and relationship well-being. Sexual activity. Eating habits. History of falls. Memory and ability to understand (cognition). Work and work Statistician. Reproductive health. Screening  You may have the following tests or measurements: Height, weight, and BMI. Blood pressure. Lipid and cholesterol levels. These may be checked every 5 years, or more frequently if you are over 60 years old. Skin check. Lung cancer screening. You may have this screening every year starting at age 26 if you have a 30-pack-year history of smoking and currently smoke or have quit within the past 15 years. Fecal occult blood test (FOBT) of the stool. You may have this test every year starting at age 69. Flexible sigmoidoscopy or colonoscopy. You may have a sigmoidoscopy every 5 years or a colonoscopy every 10 years starting at age 2. Hepatitis C blood test. Hepatitis B blood test. Sexually transmitted disease (STD) testing. Diabetes screening. This is done by checking your blood sugar (glucose) after you have not eaten for a while (fasting). You may have this done every 1-3 years. Bone density scan. This is done to screen for osteoporosis. You may have this done starting at age 19. Mammogram. This may be done every 1-2 years. Talk to your health care provider about how often you should have regular mammograms. Talk with your health care provider about your test results, treatment options, and if necessary, the need for more tests. Vaccines  Your health care provider may recommend certain vaccines, such as: Influenza vaccine. This is recommended every year. Tetanus, diphtheria, and acellular pertussis (Tdap, Td) vaccine. You may need a  Td booster every 10 years. Zoster vaccine. You may need this after age  90. Pneumococcal 13-valent conjugate (PCV13) vaccine. One dose is recommended after age 16. Pneumococcal polysaccharide (PPSV23) vaccine. One dose is recommended after age 69. Talk to your health care provider about which screenings and vaccines you need and how often you need them. This information is not intended to replace advice given to you by your health care provider. Make sure you discuss any questions you have with your health care provider. Document Released: 02/16/2015 Document Revised: 10/10/2015 Document Reviewed: 11/21/2014 Elsevier Interactive Patient Education  2017 [employer] Prevention in the Home Falls can cause injuries. They can happen to people of all ages. There are many things you can do to make your home safe and to help prevent falls. What can I do on the outside of my home? Regularly fix the edges of walkways and driveways and fix any cracks. Remove anything that might make you trip as you walk through a door, such as a raised step or threshold. Trim any bushes or trees on the path to your home. Use bright outdoor lighting. Clear any walking paths of anything that might make someone trip, such as rocks or tools. Regularly check to see if handrails are loose or broken. Make sure that both sides of any steps have handrails. Any raised decks and porches should have guardrails on the edges. Have any leaves, snow, or ice cleared regularly. Use sand or salt on walking paths during winter. Clean up any spills in your garage right away. This includes oil or grease spills. What can I do in the bathroom? Use night lights. Install grab bars by the toilet and in the tub and shower. Do not use towel bars as grab bars. Use non-skid mats or decals in the tub or shower. If you need to sit down in the shower, use a plastic, non-slip stool. Keep the floor dry. Clean up any water that spills on the floor as soon as it happens. Remove soap buildup in the tub or shower  regularly. Attach bath mats securely with double-sided non-slip rug tape. Do not have throw rugs and other things on the floor that can make you trip. What can I do in the bedroom? Use night lights. Make sure that you have a light by your bed that is easy to reach. Do not use any sheets or blankets that are too big for your bed. They should not hang down onto the floor. Have a firm chair that has side arms. You can use this for support while you get dressed. Do not have throw rugs and other things on the floor that can make you trip. What can I do in the kitchen? Clean up any spills right away. Avoid walking on wet floors. Keep items that you use a lot in easy-to-reach places. If you need to reach something above you, use a strong step stool that has a grab bar. Keep electrical cords out of the way. Do not use floor polish or wax that makes floors slippery. If you must use wax, use non-skid floor wax. Do not have throw rugs and other things on the floor that can make you trip. What can I do with my stairs? Do not leave any items on the stairs. Make sure that there are handrails on both sides of the stairs and use them. Fix handrails that are broken or loose. Make sure that handrails are as long as the stairways. Check any  carpeting to make sure that it is firmly attached to the stairs. Fix any carpet that is loose or worn. Avoid having throw rugs at the top or bottom of the stairs. If you do have throw rugs, attach them to the floor with carpet tape. Make sure that you have a light switch at the top of the stairs and the bottom of the stairs. If you do not have them, ask someone to add them for you. What else can I do to help prevent falls? Wear shoes that: Do not have high heels. Have rubber bottoms. Are comfortable and fit you well. Are closed at the toe. Do not wear sandals. If you use a stepladder: Make sure that it is fully opened. Do not climb a closed stepladder. Make sure that  both sides of the stepladder are locked into place. Ask someone to hold it for you, if possible. Clearly mark and make sure that you can see: Any grab bars or handrails. First and last steps. Where the edge of each step is. Use tools that help you move around (mobility aids) if they are needed. These include: Canes. Walkers. Scooters. Crutches. Turn on the lights when you go into a dark area. Replace any light bulbs as soon as they burn out. Set up your furniture so you have a clear path. Avoid moving your furniture around. If any of your floors are uneven, fix them. If there are any pets around you, be aware of where they are. Review your medicines with your doctor. Some medicines can make you feel dizzy. This can increase your chance of falling. Ask your doctor what other things that you can do to help prevent falls. This information is not intended to replace advice given to you by your health care provider. Make sure you discuss any questions you have with your health care provider. Document Released: 11/16/2008 Document Revised: 06/28/2015 Document Reviewed: 02/24/2014 Elsevier Interactive Patient Education  2017 [employer].

## 2020-09-08 ENCOUNTER — Encounter: Payer: Self-pay | Admitting: Physical Therapy

## 2020-09-08 NOTE — Therapy (Signed)
[facility] [address] [place], [place], [address] Phone: [phone]   Fax:  [phone]  Physical Therapy Evaluation  Patient Details  Name: Tricia [name] MRN: [id number] Date of Birth: [birth year] Referring Provider (PT): Billey Chang   Encounter Date: 09/05/2020   PT End of Session - 09/08/20 1929     Visit Number 1    Number of Visits 12    Date for PT Re-Evaluation 10/17/20    Authorization Type HTA    PT Start Time 1217    PT Stop Time 1259    PT Time Calculation (min) 42 min    Activity Tolerance Patient tolerated treatment well    Behavior During Therapy [facility] for tasks assessed/performed             Past Medical History:  Diagnosis Date   Adenomatous polyp of colon 02/2004   Anemia    Anxiety    Arthritis    B12 deficiency    CAD (coronary artery disease)    a. s/p remote BMS to LAD;  b. 09/2015 Inf STEMI/VF Arrest: LM nl, LAD 85p (staged PCI 2 days later w/ 3.0x32 Synergy DES), 40p/m, 25d, LCX 64m RCA 80ost/100p (2.75x32 Synergy DES), 620mEF 55-65%. // c. Myoview 2/18: EF 59, no ischemia or infarction; Normal study // Myoview 3/22: EF 70, no ischemia or infarction; low risk   Chronic kidney disease, stage 3, mod decreased GFR (HCC) 03/09/2020   Dermatophytosis of groin and perianal area    Diet Controlled Diabetes Mellitus    Diverticulosis    GERD (gastroesophageal reflux disease)    H/O echocardiogram    a. 09/2015 Echo: EF 60-65%, no rwma, mild AI, mildly dil RA, mild to mod TR.   History of echocardiogram    Echo 3/22: EF 60-65, no RWMA, GR 1 DD, normal RVSF, mild LAE, RVSP 35.5, mild to moderate TR, trivial AI   Hyperlipidemia    Hypertensive heart disease    Low back pain    l5 disc   Lumbar spinal stenosis 03/03/2019   Morbid obesity ([place]   Osteoporosis    PVC's (premature ventricular contractions) 04/16/2017   Holter 3/19: NSR, average heart rate 69, frequent PVCs (5% total beats), rare  supraventricular ectopics, no AF/flutter   Sleep apnea 10/03/2009   Resolved after gastric bypass     TOBACCO ABUSE 10/05/2009   Qualifier: Diagnosis of  By: CrStanford BreedMD, FAKandyce Rud  Ventricular fibrillation ([place]/28/2017   a. In setting of inferior STEMI.    Past Surgical History:  Procedure Laterality Date   ABDOMINAL HYSTERECTOMY     APPENDECTOMY     BARIATRIC SURGERY     CARDIAC CATHETERIZATION N/A 10/01/2015   Procedure: Left Heart Cath and Coronary Angiography;  Surgeon: DaLeonie ManMD;  [facility];  Service: Cardiovascular;  Laterality: N/A;   CARDIAC CATHETERIZATION N/A 10/01/2015   Procedure: Coronary Stent Intervention;  Surgeon: DaLeonie ManMD;  [facility];  Service: Cardiovascular;  Laterality: N/A;   CARDIAC CATHETERIZATION N/A 10/03/2015   Procedure: Coronary Stent Intervention;  Surgeon: ThTroy SineMD;  [facility];  Service: Cardiovascular;  Laterality: N/A;   CARDIAC CATHETERIZATION N/A 10/03/2015   Procedure: Coronary/Graft Angiography;  Surgeon: ThTroy SineMD;  [facility];  Service: Cardiovascular;  Laterality: N/A;   CARPAL TUNNEL RELEASE Bilateral    CERVICAL DISC SURGERY  CHOLECYSTECTOMY     CORONARY ANGIOPLASTY  2002   HERNIA REPAIR     LUMBAR LAMINECTOMY     L3-4   NEPHRECTOMY Right    Partial   TONSILLECTOMY     TOTAL KNEE ARTHROPLASTY Left    TUBAL LIGATION     ULNAR NERVE REPAIR Left     There were no vitals filed for this visit.    Subjective Assessment - 09/08/20 1923     Subjective Pt states ongoing back pain. Has increased pain with Standing, walking about 10-15. Pt thinks she is doing slightly better since increased gabapentin .  Was having immediate pain with standing. Does have cane, uses most of the time. Pt lives alone. Has recumbent bike. Not currently doing exercises for low back.    Pertinent History CAD, CKD,    Limitations  Lifting;Standing;Walking;House hold activities    How long can you stand comfortably? 10 min    How long can you walk comfortably? 10 in    Patient Stated Goals decreased pain    Currently in Pain? Yes    Pain Score 4     Pain Location Back    Pain Orientation Right;Left;Lower    Pain Descriptors / Indicators Aching    Pain Type Chronic pain    Pain Onset More than a month ago    Pain Frequency Intermittent    Aggravating Factors  standing, walking    Pain Relieving Factors siting, rest                [facility] PT Assessment - 09/08/20 0001       Assessment   Medical Diagnosis Back Pain    Referring Provider (PT) Billey Chang    Prior Therapy in 2019      Precautions   Precautions None      Balance Screen   Has the patient fallen in the past 6 months No      Prior Function   Level of Independence Independent      Cognition   Overall Cognitive Status Within Functional Limits for tasks assessed      AROM   Overall AROM Comments Mod limitation for flex, ext and rotation;  Hips: mild limitations for flexion and rotation      Strength   Overall Strength Comments HIps: 4-/5, Core: 3-/5 difficulty with active TA contraction                        Objective measurements completed on examination: See above findings.       [facility] PT Treatment/Exercise - 09/08/20 0001       Exercises   Exercises Lumbar      Lumbar Exercises: Stretches   Single Knee to Chest Stretch 3 reps;30 seconds    Pelvic Tilt 20 reps    Piriformis Stretch 2 reps;30 seconds    Piriformis Stretch Limitations supine modified  fig 4 stretch      Lumbar Exercises: Supine   Ab Set 15 reps    AB Set Limitations education on active contraction    Bent Knee Raise 15 reps    Bent Knee Raise Limitations with TA                    PT Education - 09/08/20 1929     Education Details PT POC, Exam findings, HEP    Person(s) Educated Patient    Methods  Explanation;Demonstration;Tactile cues;Verbal cues;Handout    Comprehension Verbalized  understanding;Returned demonstration;Verbal cues required;Tactile cues required;Need further instruction              PT Short Term Goals - 09/08/20 1933       PT SHORT TERM GOAL #1   Title Pt to be independent with inital HEP for back    Time 2    Period Weeks    Status New    Target Date 09/19/20               PT Long Term Goals - 09/08/20 1933       PT LONG TERM GOAL #1   Title Pt to report decreased pain to 0-2/10 in lumbar region     Time 6    Period Weeks    Status New    Target Date 10/17/20      PT LONG TERM GOAL #2   Title Pt to demo improved Hip and core strength to at least 4/5 to improve stability and pain.     Time 6    Period Weeks    Status New    Target Date 10/17/20      PT LONG TERM GOAL #3   Title Pt to be independent with final HEP    Time 6    Period Weeks    Status New    Target Date 10/17/20      PT LONG TERM GOAL #4   Title Pt to demo ability for standing/walking at least 30 min without pain over 3/10, to improve ability for IADLS and community activity.    Time 6    Period Weeks    Status New    Target Date 10/17/20                    Plan - 09/08/20 1935     Clinical Impression Statement Pt presents wirh primary complaint of increased pain in low back. Pt with radicular pain into R LE at times. Pt with very limtied ability and tolerance for standing activity for more than 10-15 min due to pain. Pt with weakness in hips and core, and lack of effective HEP for dx. Pt with limited ability for functional actvitiy, bending, lifting, stairs, and IADLs, due to pain. Pt to benefit from skilled PT to improve deficits and pain.    Personal Factors and Comorbidities Time since onset of injury/illness/exacerbation;Past/Current Experience    Examination-Activity Limitations Lift;Stand;Locomotion Level;Squat;Stairs    Examination-Participation  Restrictions Cleaning;Meal Prep;Community Activity;Shop;Laundry    Stability/Clinical Decision Making Stable/Uncomplicated    Clinical Decision Making Low    Rehab Potential Good    PT Frequency 2x / week    PT Duration 6 weeks    PT Treatment/Interventions ADLs/Self Care Home Management;Cryotherapy;Electrical Stimulation;Iontophoresis '4mg'$ /ml Dexamethasone;Moist Heat;Traction;Ultrasound;DME Instruction;Gait training;Stair training;Functional mobility training;Therapeutic activities;Therapeutic exercise;Balance training;Patient/family education;Neuromuscular re-education;Manual techniques;Passive range of motion;Dry needling;Spinal Manipulations;Joint Manipulations;Splinting    PT Home Exercise Plan [id number]    Consulted and Agree with Plan of Care Patient             Patient will benefit from skilled therapeutic intervention in order to improve the following deficits and impairments:  Improper body mechanics, Pain, Increased muscle spasms, Decreased mobility, Decreased activity tolerance, Decreased range of motion, Decreased strength, Impaired flexibility, Difficulty walking  Visit Diagnosis: Chronic bilateral low back pain with right-sided sciatica     Problem List Patient Active Problem List   Diagnosis Date Noted   Type 2 diabetes with nephropathy ([place]) 08/22/2020   Chronic kidney disease,  stage 3, mod decreased GFR ([place]) 03/09/2020   History of partial nephrectomy 09/08/2019   Laryngopharyngeal reflux (LPR) 09/06/2019   Diet-controlled diabetes mellitus ([place]) 03/04/2019   Major depression, recurrent, chronic ([place]) 03/04/2019   Mixed hyperlipidemia 03/04/2019   Lumbar spinal stenosis 03/03/2019   Bilateral lower extremity edema 11/21/2018   Class 3 obesity with alveolar hypoventilation, serious comorbidity, and body mass index (BMI) of 45.0 to 49.9 in adult [facility]) 11/21/2018   PVC's (premature ventricular contractions) 04/16/2017   Hyperparathyroidism, secondary ([place])  07/06/2016   COPD (chronic obstructive pulmonary disease) ([place]) 04/19/2016   Coronary artery disease involving native coronary artery of native heart with angina pectoris ([place]) 03/10/2016   History of cardiac arrest 10/01/2015   History of ST elevation myocardial infarction (STEMI) 10/01/2015   GERD (gastroesophageal reflux disease), Rx Protonix 02/14/2014   Status post gastric bypass for obesity 02/14/2014   Former smoker, 50 pack years, quit 2010 02/14/2014   Anemia, B12 deficiency 04/04/2009   Osteoporosis 12/14/2006   Hyperlipidemia associated with type 2 diabetes mellitus ([place]), LDL goal < 70, Rx Lipitor 09/22/2006   Hypertension associated with diabetes ([place]) 09/22/2006   Osteoarthritis, multiple sites 09/22/2006   Degenerative joint disease (DJD) of lumbar spine 09/22/2006   Adenomatous polyp of colon 02/04/2004   Lyndee Hensen, PT, DPT 7:41 PM  09/08/20   [facility] [address] [place], [place], [address] Phone: [phone]   Fax:  [phone]  Name: SAMAN YAZEL MRN: [id number] Date of Birth: 11/06/1945

## 2020-09-12 ENCOUNTER — Ambulatory Visit (INDEPENDENT_AMBULATORY_CARE_PROVIDER_SITE_OTHER): Payer: PPO | Admitting: Family Medicine

## 2020-09-12 ENCOUNTER — Encounter: Payer: Self-pay | Admitting: Family Medicine

## 2020-09-12 ENCOUNTER — Other Ambulatory Visit: Payer: Self-pay

## 2020-09-12 VITALS — BP 112/68 | HR 54 | Temp 98.2°F | Resp 16 | Ht 62.0 in | Wt 232.6 lb

## 2020-09-12 DIAGNOSIS — R600 Localized edema: Secondary | ICD-10-CM | POA: Diagnosis not present

## 2020-09-12 DIAGNOSIS — E785 Hyperlipidemia, unspecified: Secondary | ICD-10-CM | POA: Diagnosis not present

## 2020-09-12 DIAGNOSIS — Z905 Acquired absence of kidney: Secondary | ICD-10-CM

## 2020-09-12 DIAGNOSIS — Z0000 Encounter for general adult medical examination without abnormal findings: Secondary | ICD-10-CM | POA: Diagnosis not present

## 2020-09-12 DIAGNOSIS — I25119 Atherosclerotic heart disease of native coronary artery with unspecified angina pectoris: Secondary | ICD-10-CM | POA: Diagnosis not present

## 2020-09-12 DIAGNOSIS — E1159 Type 2 diabetes mellitus with other circulatory complications: Secondary | ICD-10-CM

## 2020-09-12 DIAGNOSIS — N1831 Chronic kidney disease, stage 3a: Secondary | ICD-10-CM

## 2020-09-12 DIAGNOSIS — I152 Hypertension secondary to endocrine disorders: Secondary | ICD-10-CM | POA: Diagnosis not present

## 2020-09-12 DIAGNOSIS — J449 Chronic obstructive pulmonary disease, unspecified: Secondary | ICD-10-CM | POA: Diagnosis not present

## 2020-09-12 DIAGNOSIS — E1121 Type 2 diabetes mellitus with diabetic nephropathy: Secondary | ICD-10-CM

## 2020-09-12 DIAGNOSIS — E1169 Type 2 diabetes mellitus with other specified complication: Secondary | ICD-10-CM

## 2020-09-12 DIAGNOSIS — F339 Major depressive disorder, recurrent, unspecified: Secondary | ICD-10-CM | POA: Diagnosis not present

## 2020-09-12 DIAGNOSIS — R2689 Other abnormalities of gait and mobility: Secondary | ICD-10-CM | POA: Diagnosis not present

## 2020-09-12 LAB — CBC WITH DIFFERENTIAL/PLATELET
Basophils Absolute: 0 10*3/uL (ref 0.0–0.1)
Basophils Relative: 0.4 % (ref 0.0–3.0)
Eosinophils Absolute: 0.2 10*3/uL (ref 0.0–0.7)
Eosinophils Relative: 3 % (ref 0.0–5.0)
HCT: 36.6 % (ref 36.0–46.0)
Hemoglobin: 12.3 g/dL (ref 12.0–15.0)
Lymphocytes Relative: 22.2 % (ref 12.0–46.0)
Lymphs Abs: 1.5 10*3/uL (ref 0.7–4.0)
MCHC: 33.7 g/dL (ref 30.0–36.0)
MCV: 81.7 fl (ref 78.0–100.0)
Monocytes Absolute: 0.4 10*3/uL (ref 0.1–1.0)
Monocytes Relative: 6.4 % (ref 3.0–12.0)
Neutro Abs: 4.6 10*3/uL (ref 1.4–7.7)
Neutrophils Relative %: 68 % (ref 43.0–77.0)
Platelets: 261 10*3/uL (ref 150.0–400.0)
RBC: 4.48 Mil/uL (ref 3.87–5.11)
RDW: 13.9 % (ref 11.5–15.5)
WBC: 6.7 10*3/uL (ref 4.0–10.5)

## 2020-09-12 LAB — COMPREHENSIVE METABOLIC PANEL
ALT: 12 U/L (ref 0–35)
AST: 14 U/L (ref 0–37)
Albumin: 3.7 g/dL (ref 3.5–5.2)
Alkaline Phosphatase: 237 U/L — ABNORMAL HIGH (ref 39–117)
BUN: 23 mg/dL (ref 6–23)
CO2: 27 mEq/L (ref 19–32)
Calcium: 9.6 mg/dL (ref 8.4–10.5)
Chloride: 105 mEq/L (ref 96–112)
Creatinine, Ser: 1.04 mg/dL (ref 0.40–1.20)
GFR: 53.5 mL/min — ABNORMAL LOW (ref >60.00)
Glucose, Bld: 121 mg/dL — ABNORMAL HIGH (ref 70–99)
Potassium: 3.4 mEq/L — ABNORMAL LOW (ref 3.5–5.1)
Sodium: 142 mEq/L (ref 135–145)
Total Bilirubin: 0.5 mg/dL (ref 0.2–1.2)
Total Protein: 6.6 g/dL (ref 6.0–8.3)

## 2020-09-12 LAB — LIPID PANEL
Cholesterol: 125 mg/dL (ref 0–200)
HDL: 42.2 mg/dL (ref >39.00)
LDL Cholesterol: 60 mg/dL (ref 0–99)
NonHDL: 82.79
Total CHOL/HDL Ratio: 3
Triglycerides: 113 mg/dL (ref 0.0–149.0)
VLDL: 22.6 mg/dL (ref 0.0–40.0)

## 2020-09-12 LAB — TSH: TSH: 3.38 u[IU]/mL (ref 0.35–5.50)

## 2020-09-12 MED ORDER — DAPAGLIFLOZIN PROPANEDIOL 5 MG PO TABS: 5.0000 mg | ORAL_TABLET | Freq: Every day | ORAL | 3 refills | Status: DC

## 2020-09-12 MED ORDER — DAPAGLIFLOZIN PROPANEDIOL 5 MG PO TABS: 5.0000 mg | ORAL_TABLET | Freq: Every day | ORAL | Status: DC

## 2020-09-12 NOTE — Patient Instructions (Signed)
Please return in 3 months for diabetes follow up   I will release your lab results to you on your MyChart account with further instructions. Please reply with any questions.  Give me several days to review your labs and respond first.   Let me know if you get symptoms of yeast infection.   If you have any questions or concerns, please don't hesitate to send me a message via MyChart or call the office at [phone]. Thank you for visiting with [place] today! It's our pleasure caring for you.

## 2020-09-12 NOTE — Progress Notes (Signed)
Subjective  Chief Complaint  Patient presents with   Annual Exam    Fasting, wondering how much water intake she should have daily    Obesity    Wanting nutritionist referral    Diabetes    Wanting to know if there is a medication to help control yeast due to [place]     HPI: Tricia [name] is a 75 y.o. female who presents to [facility] today for a Female Wellness Visit. She also has the concerns and/or needs as listed above in the chief complaint. These will be addressed in addition to the Health Maintenance Visit.   Wellness Visit: annual visit with health maintenance review and exam without Pap  Health maintenance: Screens are up-to-date.  Bone density with persistent osteopenia.  To recheck next year.  Weightbearing activity is limited due to back pain.  Also reports poor balance.  She has started physical therapy for her back pain.  No recent falls.  No mood concerns.  Immunizations are up-to-date. Chronic disease f/u and/or acute problem visit: (deemed necessary to be done in addition to the wellness visit): History of MI with coronary artery disease: Followed by cardiology.  Reviewed notes.  Stable on beta-blocker.  No symptoms of bradycardia.  Has diastolic dysfunction and Lasix controls her edema intermittently. Hyperlipidemia on chronic statin well controlled.  No adverse effects. Diabetes: Started Farxiga 10 mg daily about 2 weeks ago.  No vulvar itching, redness or irritation but she does note a slight white discharge on her urinary pad.  She has mild urinary incontinence.  She tries to keep clean after going to the bathroom.  She drinks plenty of water.  Tolerating the medication well.  For diabetes and renal protective.  She does have nephropathy.  She has history of partial nephrectomy. Nutrition: She would like to see nutritionist to help her manage her diet.  She would like weight loss.  She has a follow diabetic diet but also is very concerned  about high-protein diets for weight loss given her history of kidney disease. COPD: Denies symptoms currently.  Reports Atrovent was even more expensive than Spiriva.  Rarely uses albuterol.  Lab Results  Component Value Date   HGBA1C 6.7 (H) 06/07/2020   HGBA1C 6.5 (A) 03/09/2020   HGBA1C 6.3 (A) 09/06/2019     Assessment  1. Annual physical exam   2. Type 2 diabetes with nephropathy ([place])   3. Coronary artery disease involving native coronary artery of native heart with angina pectoris ([place])   4. Chronic obstructive pulmonary disease, unspecified COPD type ([place])   5. Bilateral lower extremity edema   6. Major depression, recurrent, chronic ([place])   7. Hypertension associated with diabetes ([place])   8. Hyperlipidemia associated with type 2 diabetes mellitus ([place])   9. History of partial nephrectomy   10. Stage 3a chronic kidney disease [facility])      Plan  Female Wellness Visit: Age appropriate Health Maintenance and Prevention measures were discussed with patient. Included topics are cancer screening recommendations, ways to keep healthy (see AVS) including dietary and exercise recommendations, regular eye and dental care, use of seat belts, and avoidance of moderate alcohol use and tobacco use.  Screens are up-to-date BMI: discussed patient's BMI and encouraged positive lifestyle modifications to help get to or maintain a target BMI. HM needs and immunizations were addressed and ordered. See below for orders. See HM and immunization section for updates. Routine labs and screening tests ordered including  cmp, cbc and lipids where appropriate. Discussed recommendations regarding Vit D and calcium supplementation (see AVS)  Chronic disease management visit and/or acute problem visit: Coronary artery disease: Stable.  Continue Plavix Type 2 diabetes: We will decrease Farxiga to 5 mg daily.  Monitor for symptoms of infection.  Hygiene discussed.  To recheck A1c and renal function in 3 months.   Continue diabetic diet.  Refer to diabetes nutrition management. Hypertension remains well controlled.  Monitor heart rate.  Asymptomatic.  Follow-up with cardiology.  Continue Diovan 320 daily, carvedilol 6.25 twice daily Hyperlipidemia: Recheck lipids.  Has been well controlled.  Goal LDL less than 70.  Continue atorvastatin 80 mg nightly and Zetia 10 mg nightly. Lower extremity edema, multifactorial.  Stable on intermittent Lasix Low back pain from lumbar spine stenosis, poor balance: Multifactorial.  Recommend PT.  Continue gabapentin 300 3 times daily, baclofen as needed. History of depression: Controlled.  No longer on antidepressants.  Follow up: 3 months to recheck diabetes Orders Placed This Encounter  Procedures   CBC with Differential/Platelet   Comprehensive metabolic panel   Lipid panel   TSH   Amb ref to Medical Nutrition Therapy-MNT   Meds ordered this encounter  Medications   DISCONTD: dapagliflozin propanediol (FARXIGA) 5 MG TABS tablet    Sig: Take 1 tablet (5 mg total) by mouth daily.   dapagliflozin propanediol (FARXIGA) 5 MG TABS tablet    Sig: Take 1 tablet (5 mg total) by mouth daily.    Dispense:  90 tablet    Refill:  3       Body mass index is 42.54 kg/m. Wt Readings from Last 3 Encounters:  09/12/20 232 lb 9.6 oz (105.5 kg)  08/22/20 228 lb 12.8 oz (103.8 kg)  06/07/20 231 lb 6.1 oz (105 kg)     Patient Active Problem List   Diagnosis Date Noted   Type 2 diabetes with nephropathy ([place]) 08/22/2020    Priority: High   Chronic kidney disease, stage 3, mod decreased GFR ([place]) 03/09/2020    Priority: High   History of partial nephrectomy 09/08/2019    Priority: High    Right due to recurrent infections; now with only one functional kidney on left; done in the 70s.     Major depression, recurrent, chronic ([place]) 03/04/2019    Priority: High   Lumbar spinal stenosis 03/03/2019    Priority: High   Coronary artery disease involving native coronary  artery of native heart with angina pectoris ([place]) 03/10/2016    Priority: High   History of cardiac arrest 10/01/2015    Priority: High   History of ST elevation myocardial infarction (STEMI) 10/01/2015    Priority: High    Study Conclusions 10/02/15:   - Left ventricle: The cavity size was normal. Wall thickness was   normal. Systolic function was normal. The estimated ejection   fraction was in the range of 60% to 65%. Wall motion was normal;   there were no regional wall motion abnormalities. - Aortic valve: There was mild regurgitation. - Right atrium: The atrium was mildly dilated. - Tricuspid valve: There was mild-moderate regurgitation.     Osteoporosis 12/14/2006    Priority: High    DEXA 2018: T = -2.5. started on prolia and f/u 2019 was improved T = -2.3 Dexa 2021: T = -2.3; will recheck 2 years; ca and vit D only for now.      Hyperlipidemia associated with type 2 diabetes mellitus ([place]) 09/22/2006    Priority:  High   Hypertension associated with diabetes ([employer]) 09/22/2006    Priority: High   Adenomatous polyp of colon 02/04/2004    Priority: High   Laryngopharyngeal reflux (LPR) 09/06/2019    Priority: Medium   Hyperparathyroidism, secondary ([place]) 07/06/2016    Priority: Medium   COPD (chronic obstructive pulmonary disease) ([place]) 04/19/2016    Priority: Medium    identified on CT 04/17/16, former smoker; uses proair intermittently. Not on maintenance inhaler -reported atrovent and spriva were too expensive     GERD (gastroesophageal reflux disease), Rx Protonix 02/14/2014    Priority: Medium   Status post gastric bypass for obesity 02/14/2014    Priority: Medium   Former smoker, 50 pack years, quit 2010 02/14/2014    Priority: Medium   Anemia, B12 deficiency 04/04/2009    Priority: Medium   Osteoarthritis, multiple sites 09/22/2006    Priority: Medium    Throughout back, knees, hands     Degenerative joint disease (DJD) of lumbar spine 09/22/2006     Priority: Medium   Bilateral lower extremity edema 11/21/2018    Priority: Low   PVC's (premature ventricular contractions) 04/16/2017    Priority: Low    Holter 3/19: NSR, average heart rate 69, frequent PVCs (5% total beats), rare supraventricular ectopics, no AF/flutter    Health Maintenance  Topic Date Due   INFLUENZA VACCINE  09/03/2020   OPHTHALMOLOGY EXAM  09/13/2020   HEMOGLOBIN A1C  12/08/2020   FOOT EXAM  03/09/2021   MAMMOGRAM  04/23/2021   DEXA SCAN  09/14/2021   COLONOSCOPY (Pts 45-45yr Insurance coverage will need to be confirmed)  05/03/2023   COVID-19 Vaccine  Completed   Hepatitis C Screening  Completed   PNA vac Low Risk Adult  Completed   Zoster Vaccines- Shingrix  Completed   HPV VACCINES  Aged Out   TETANUS/TDAP  Discontinued   Immunization History  Administered Date(s) Administered   Fluad Quad(high Dose 65+) 10/07/2018, 10/18/2019   Influenza Split 01/21/2011   Influenza Whole 11/19/2007   Influenza, High Dose Seasonal PF 10/16/2015, 11/12/2016, 11/07/2017   Influenza-Unspecified 12/18/2013   PFIZER Comirnaty(Gray Top)Covid-19 Tri-Sucrose Vaccine 05/31/2020   PFIZER(Purple Top)SARS-COV-2 Vaccination 03/17/2019, 04/11/2019, 11/03/2019   Pneumococcal Conjugate-13 08/26/2013   Pneumococcal Polysaccharide-23 02/04/2003, 06/08/2015   Td 02/03/2005   Zoster Recombinat (Shingrix) 02/18/2017, 07/19/2017   We updated and reviewed the patient's past history in detail and it is documented below. Allergies: Patient is allergic to sulfa antibiotics. Past Medical History Patient  has a past medical history of Adenomatous polyp of colon (02/2004), Anemia, Anxiety, Arthritis, B12 deficiency, CAD (coronary artery disease), Chronic kidney disease, stage 3, mod decreased GFR ([place] (03/09/2020), Dermatophytosis of groin and perianal area, Diet Controlled Diabetes Mellitus, Diverticulosis, GERD (gastroesophageal reflux disease), H/O echocardiogram, History of echocardiogram,  Hyperlipidemia, Hypertensive heart disease, Low back pain, Lumbar spinal stenosis (03/03/2019), Morbid obesity ([place], Osteoporosis, PVC's (premature ventricular contractions) (04/16/2017), Sleep apnea (10/03/2009), TOBACCO ABUSE (10/05/2009), and Ventricular fibrillation ([place] (10/01/2015). Past Surgical History Patient  has a past surgical history that includes Abdominal hysterectomy; Bariatric Surgery; Coronary angioplasty (2002); Appendectomy; Carpal tunnel release (Bilateral); Hernia repair; Tonsillectomy; Total knee arthroplasty (Left); Nephrectomy (Right); Lumbar laminectomy; Cholecystectomy; Tubal ligation; Cervical disc surgery; Cardiac catheterization (N/A, 10/01/2015); Cardiac catheterization (N/A, 10/01/2015); Cardiac catheterization (N/A, 10/03/2015); Cardiac catheterization (N/A, 10/03/2015); and Ulnar nerve repair (Left). Family History: Patient family history includes Colitis in her mother; Heart disease in her father; Lung cancer in her maternal uncle and maternal uncle; Stomach cancer in her maternal  aunt. Social History:  Patient  reports that she quit smoking about 5 years ago. Her smoking use included cigarettes. She has a 82.00 pack-year smoking history. She has never used smokeless tobacco. She reports that she does not drink alcohol and does not use drugs.  Review of Systems: Constitutional: negative for fever or malaise Ophthalmic: negative for photophobia, double vision or loss of vision Cardiovascular: negative for chest pain, dyspnea on exertion, or new LE swelling Respiratory: negative for SOB or persistent cough Gastrointestinal: negative for abdominal pain, change in bowel habits or melena Genitourinary: negative for dysuria or gross hematuria, no abnormal uterine bleeding or disharge Musculoskeletal: negative for new gait disturbance or muscular weakness Integumentary: negative for new or persistent rashes, no breast lumps Neurological: negative for TIA or stroke  symptoms Psychiatric: negative for SI or delusions Allergic/Immunologic: negative for hives  Patient Care Team    Relationship Specialty Notifications Start End  Leamon Arnt, MD PCP - General Family Medicine  03/03/19   Sherren Mocha, MD PCP - Cardiology Cardiology Admissions 05/16/19   Ladene Artist, MD Consulting Physician Gastroenterology  08/31/17   Liliane Shi, PA-C Physician Assistant Cardiology  08/31/17   Gardiner Barefoot, DPM Consulting Physician Podiatry  08/31/17   Sherren Mocha, MD Consulting Physician Cardiology  03/03/19   Jovita Gamma, MD Consulting Physician Neurosurgery  03/03/19   Madelin Rear, [facility] Pharmacist Pharmacist  05/17/20    Comment: Phone [phone]    Objective  Vitals: BP 112/68   Pulse (!) 54   Temp 98.2 F (36.8 C) (Temporal)   Resp 16   Ht '5\' 2"'$  (1.575 m)   Wt 232 lb 9.6 oz (105.5 kg)   LMP  (LMP Unknown)   SpO2 95%   BMI 42.54 kg/m  General:  Well developed, well nourished, no acute distress  Psych:  Alert and orientedx3,normal mood and affect HEENT:  Normocephalic, atraumatic, non-icteric sclera,  supple neck without adenopathy, mass or thyromegaly Cardiovascular:  Normal S1, S2, RRR without gallop, rub or murmur Respiratory:  Good breath sounds bilaterally, CTAB with normal respiratory effort Gastrointestinal: normal bowel sounds, soft, non-tender, no noted masses. No HSM MSK: no deformities, contusions. Joints are without erythema or swelling.  Tenderness to light touch diffusely. Skin:  Warm, no rashes or suspicious lesions noted Neurologic:    Mental status is normal. CN 2-11 are normal. Gross motor and sensory exams are normal.  Unsteady gait. No tremor   Commons side effects, risks, benefits, and alternatives for medications and treatment plan prescribed today were discussed, and the patient expressed understanding of the given instructions. Patient is instructed to call or message via MyChart if he/she has any questions or  concerns regarding our treatment plan. No barriers to understanding were identified. We discussed Red Flag symptoms and signs in detail. Patient expressed understanding regarding what to do in case of urgent or emergency type symptoms.  Medication list was reconciled, printed and provided to the patient in AVS. Patient instructions and summary information was reviewed with the patient as documented in the AVS. This note was prepared with assistance of Dragon voice recognition software. Occasional wrong-[name] or sound-a-like substitutions may have occurred due to the inherent limitations of voice recognition software  This visit occurred during the SARS-CoV-2 public health emergency.  Safety protocols were in place, including screening questions prior to the visit, additional usage of staff PPE, and extensive cleaning of exam room while observing appropriate contact time as indicated for disinfecting solutions.

## 2020-09-14 ENCOUNTER — Other Ambulatory Visit: Payer: Self-pay | Admitting: *Deleted

## 2020-09-14 DIAGNOSIS — E119 Type 2 diabetes mellitus without complications: Secondary | ICD-10-CM | POA: Diagnosis not present

## 2020-09-14 LAB — HM DIABETES EYE EXAM

## 2020-09-14 MED ORDER — POTASSIUM CHLORIDE CRYS ER 10 MEQ PO TBCR: EXTENDED_RELEASE_TABLET | ORAL | 0 refills | Status: DC

## 2020-09-14 NOTE — Progress Notes (Signed)
Please call patient: I have reviewed his/her lab results. Potassium is now a little low. Please start potassium 53mq daily as needed; take with furosemide.  This will correct the low potassium. Only take when she takes furosemide for leg swelling, NOT daily. Thanks.  Other labs look stable.

## 2020-09-19 ENCOUNTER — Encounter: Payer: PPO | Admitting: Physical Therapy

## 2020-09-20 ENCOUNTER — Other Ambulatory Visit: Payer: Self-pay

## 2020-09-20 MED ORDER — FREESTYLE LITE TEST VI STRP: ORAL_STRIP | 5 refills | Status: DC

## 2020-09-26 ENCOUNTER — Ambulatory Visit: Payer: PPO | Admitting: Physical Therapy

## 2020-09-26 ENCOUNTER — Encounter: Payer: Self-pay | Admitting: Physical Therapy

## 2020-09-26 ENCOUNTER — Other Ambulatory Visit: Payer: Self-pay

## 2020-09-26 DIAGNOSIS — M5441 Lumbago with sciatica, right side: Secondary | ICD-10-CM | POA: Diagnosis not present

## 2020-09-26 DIAGNOSIS — G8929 Other chronic pain: Secondary | ICD-10-CM

## 2020-09-26 NOTE — Therapy (Signed)
[facility] [address] [place], [place], [address] Phone: [phone]   Fax:  [phone]  Physical Therapy Treatment  Patient Details  Name: Tricia [name] MRN: [id number] Date of Birth: 01-31-1946 Referring Provider (PT): Billey Chang   Encounter Date: 09/26/2020   PT End of Session - 09/26/20 1414     Visit Number 2    Number of Visits 12    Date for PT Re-Evaluation 10/17/20    Authorization Type HTA    PT Start Time 1105    PT Stop Time 1145    PT Time Calculation (min) 40 min    Activity Tolerance Patient tolerated treatment well    Behavior During Therapy [facility] for tasks assessed/performed             Past Medical History:  Diagnosis Date   Adenomatous polyp of colon 02/2004   Anemia    Anxiety    Arthritis    B12 deficiency    CAD (coronary artery disease)    a. s/p remote BMS to LAD;  b. 09/2015 Inf STEMI/VF Arrest: LM nl, LAD 85p (staged PCI 2 days later w/ 3.0x32 Synergy DES), 40p/m, 25d, LCX 35m RCA 80ost/100p (2.75x32 Synergy DES), 636mEF 55-65%. // c. Myoview 2/18: EF 59, no ischemia or infarction; Normal study // Myoview 3/22: EF 70, no ischemia or infarction; low risk   Chronic kidney disease, stage 3, mod decreased GFR (HCC) 03/09/2020   Dermatophytosis of groin and perianal area    Diet Controlled Diabetes Mellitus    Diverticulosis    GERD (gastroesophageal reflux disease)    H/O echocardiogram    a. 09/2015 Echo: EF 60-65%, no rwma, mild AI, mildly dil RA, mild to mod TR.   History of echocardiogram    Echo 3/22: EF 60-65, no RWMA, GR 1 DD, normal RVSF, mild LAE, RVSP 35.5, mild to moderate TR, trivial AI   Hyperlipidemia    Hypertensive heart disease    Low back pain    l5 disc   Lumbar spinal stenosis 03/03/2019   Morbid obesity ([facility]   Osteoporosis    PVC's (premature ventricular contractions) 04/16/2017   Holter 3/19: NSR, average heart rate 69, frequent PVCs (5% total beats), rare  supraventricular ectopics, no AF/flutter   Sleep apnea 10/03/2009   Resolved after gastric bypass     TOBACCO ABUSE 10/05/2009   Qualifier: Diagnosis of  By: CrStanford BreedMD, FAKandyce Rud  Ventricular fibrillation ([facility]08/28/2017   a. In setting of inferior STEMI.    Past Surgical History:  Procedure Laterality Date   ABDOMINAL HYSTERECTOMY     APPENDECTOMY     BARIATRIC SURGERY     CARDIAC CATHETERIZATION N/A 10/01/2015   Procedure: Left Heart Cath and Coronary Angiography;  Surgeon: DaLeonie ManMD;  [facility];  Service: Cardiovascular;  Laterality: N/A;   CARDIAC CATHETERIZATION N/A 10/01/2015   Procedure: Coronary Stent Intervention;  Surgeon: DaLeonie ManMD;  [facility];  Service: Cardiovascular;  Laterality: N/A;   CARDIAC CATHETERIZATION N/A 10/03/2015   Procedure: Coronary Stent Intervention;  Surgeon: ThTroy SineMD;  [facility];  Service: Cardiovascular;  Laterality: N/A;   CARDIAC CATHETERIZATION N/A 10/03/2015   Procedure: Coronary/Graft Angiography;  Surgeon: ThTroy SineMD;  [facility];  Service: Cardiovascular;  Laterality: N/A;   CARPAL TUNNEL RELEASE Bilateral    CERVICAL DISC SURGERY  CHOLECYSTECTOMY     CORONARY ANGIOPLASTY  2002   HERNIA REPAIR     LUMBAR LAMINECTOMY     L3-4   NEPHRECTOMY Right    Partial   TONSILLECTOMY     TOTAL KNEE ARTHROPLASTY Left    TUBAL LIGATION     ULNAR NERVE REPAIR Left     There were no vitals filed for this visit.   Subjective Assessment - 09/26/20 1113     Subjective Pt states pain in R side of back. and pain in low back with standing and walking.  Pt states no pain down R leg, but tingling into toes. She is unsure if this is from Diabetes or from her back.    Pertinent History CAD, CKD,    Limitations Lifting;Standing;Walking;House hold activities    How long can you stand comfortably? 10 min    How long can you walk  comfortably? 10 min    Patient Stated Goals decreased pain    Currently in Pain? Yes    Pain Score 4     Pain Location Back    Pain Orientation Right    Pain Descriptors / Indicators Aching    Pain Type Chronic pain    Pain Onset More than a month ago    Pain Frequency Intermittent                               [facility] Adult PT Treatment/Exercise - 09/26/20 0001       Exercises   Exercises Lumbar      Lumbar Exercises: Stretches   Single Knee to Chest Stretch 3 reps;30 seconds    Pelvic Tilt 20 reps    Piriformis Stretch 2 reps;30 seconds    Piriformis Stretch Limitations supine modified  fig 4 stretch      Lumbar Exercises: Seated   Long Arc Quad on Chair 20 reps    LAQ on Chair Weights (lbs) 3    Sit to Stand 10 reps    Other Seated Lumbar Exercises Rows seated, GTB x 15,  standing scap retract x 15;      Lumbar Exercises: Supine   Ab Set 15 reps    AB Set Limitations education on active contraction    Clam 20 reps    Clam Limitations GTB with TA    Bent Knee Raise 15 reps      Manual Therapy   Manual Therapy Joint mobilization;Soft tissue mobilization    Soft tissue mobilization STM/DTM to R lumbar paraspinals and QL, in S/L                      PT Short Term Goals - 09/08/20 1933       PT SHORT TERM GOAL #1   Title Pt to be independent with inital HEP for back    Time 2    Period Weeks    Status New    Target Date 09/19/20               PT Long Term Goals - 09/08/20 1933       PT LONG TERM GOAL #1   Title Pt to report decreased pain to 0-2/10 in lumbar region     Time 6    Period Weeks    Status New    Target Date 10/17/20      PT LONG TERM GOAL #2   Title Pt to demo improved  Hip and core strength to at least 4/5 to improve stability and pain.     Time 6    Period Weeks    Status New    Target Date 10/17/20      PT LONG TERM GOAL #3   Title Pt to be independent with final HEP    Time 6    Period Weeks     Status New    Target Date 10/17/20      PT LONG TERM GOAL #4   Title Pt to demo ability for standing/walking at least 30 min without pain over 3/10, to improve ability for IADLS and community activity.    Time 6    Period Weeks    Status New    Target Date 10/17/20                   Plan - 09/26/20 1417     Clinical Impression Statement Pt with increased soreness on R side, further up today, at QL, sore with palpation and manual tissue release. Pt educated on optimal seated posture today, pt has tendency for thoracic kyphosis. Pt with noted weakness in L hip, and will benefit from progressive strengthening for hips and core stabilization.    Personal Factors and Comorbidities Time since onset of injury/illness/exacerbation;Past/Current Experience    Examination-Activity Limitations Lift;Stand;Locomotion Level;Squat;Stairs    Examination-Participation Restrictions Cleaning;Meal Prep;Community Activity;Shop;Laundry    Stability/Clinical Decision Making Stable/Uncomplicated    Rehab Potential Good    PT Frequency 2x / week    PT Duration 6 weeks    PT Treatment/Interventions ADLs/Self Care Home Management;Cryotherapy;Electrical Stimulation;Iontophoresis '4mg'$ /ml Dexamethasone;Moist Heat;Traction;Ultrasound;DME Instruction;Gait training;Stair training;Functional mobility training;Therapeutic activities;Therapeutic exercise;Balance training;Patient/family education;Neuromuscular re-education;Manual techniques;Passive range of motion;Dry needling;Spinal Manipulations;Joint Manipulations;Splinting    PT Home Exercise Plan [id number]    Consulted and Agree with Plan of Care Patient             Patient will benefit from skilled therapeutic intervention in order to improve the following deficits and impairments:  Improper body mechanics, Pain, Increased muscle spasms, Decreased mobility, Decreased activity tolerance, Decreased range of motion, Decreased strength, Impaired flexibility,  Difficulty walking  Visit Diagnosis: Chronic bilateral low back pain with right-sided sciatica     Problem List Patient Active Problem List   Diagnosis Date Noted   Poor balance 09/12/2020   Type 2 diabetes with nephropathy ([place]) 08/22/2020   Chronic kidney disease, stage 3, mod decreased GFR ([place]) 03/09/2020   History of partial nephrectomy 09/08/2019   Laryngopharyngeal reflux (LPR) 09/06/2019   Major depression, recurrent, chronic ([place]) 03/04/2019   Lumbar spinal stenosis 03/03/2019   Bilateral lower extremity edema 11/21/2018   PVC's (premature ventricular contractions) 04/16/2017   Hyperparathyroidism, secondary ([place]) 07/06/2016   COPD (chronic obstructive pulmonary disease) ([place]) 04/19/2016   Coronary artery disease involving native coronary artery of native heart with angina pectoris ([place]) 03/10/2016   History of cardiac arrest 10/01/2015   History of ST elevation myocardial infarction (STEMI) 10/01/2015   GERD (gastroesophageal reflux disease), Rx Protonix 02/14/2014   Status post gastric bypass for obesity 02/14/2014   Former smoker, 50 pack years, quit 2010 02/14/2014   Anemia, B12 deficiency 04/04/2009   Osteoporosis 12/14/2006   Hyperlipidemia associated with type 2 diabetes mellitus ([place]) 09/22/2006   Hypertension associated with diabetes ([place]) 09/22/2006   Osteoarthritis, multiple sites 09/22/2006   Degenerative joint disease (DJD) of lumbar spine 09/22/2006   Adenomatous polyp of colon 02/04/2004   Lyndee Hensen, PT, DPT 2:21 PM  09/26/20    [facility] [address] [place], [place], [address] Phone: [phone]   Fax:  [phone]  Name: Tricia [name] MRN: [id number] Date of Birth: November 02, 1945

## 2020-10-01 ENCOUNTER — Encounter: Payer: Self-pay | Admitting: Family Medicine

## 2020-10-01 DIAGNOSIS — R600 Localized edema: Secondary | ICD-10-CM

## 2020-10-02 MED ORDER — FUROSEMIDE 20 MG PO TABS: 20.0000 mg | ORAL_TABLET | ORAL | 3 refills | Status: DC | PRN

## 2020-10-03 ENCOUNTER — Encounter: Payer: PPO | Admitting: Physical Therapy

## 2020-10-03 ENCOUNTER — Telehealth: Payer: Self-pay

## 2020-10-03 NOTE — Telephone Encounter (Signed)
Christine from [place] with [employer] called stating she has a few questions about prescriptions. She stated that the best number is [phone]. Please Advise.

## 2020-10-04 NOTE — Telephone Encounter (Signed)
[place] and spoke to [name], needed to clarify directions for Rx's Furosemide and Potassium . Told him Furosemide   take 1-2 tablets daily as needed for swelling and Potassium take one table daily when taking Furosemide. Tricia [name] verbalized understanding and will get sent out to patient. Told him okay, thanks.

## 2020-10-12 ENCOUNTER — Encounter: Payer: Self-pay | Admitting: Family Medicine

## 2020-10-13 ENCOUNTER — Other Ambulatory Visit: Payer: Self-pay | Admitting: Family Medicine

## 2020-10-13 DIAGNOSIS — G8929 Other chronic pain: Secondary | ICD-10-CM

## 2020-10-16 ENCOUNTER — Ambulatory Visit: Payer: PPO | Admitting: Physical Therapy

## 2020-10-16 ENCOUNTER — Encounter: Payer: Self-pay | Admitting: Physical Therapy

## 2020-10-16 ENCOUNTER — Other Ambulatory Visit: Payer: Self-pay

## 2020-10-16 DIAGNOSIS — G8929 Other chronic pain: Secondary | ICD-10-CM | POA: Diagnosis not present

## 2020-10-16 DIAGNOSIS — M5441 Lumbago with sciatica, right side: Secondary | ICD-10-CM

## 2020-10-16 NOTE — Therapy (Addendum)
[facility] [address] [place], [place], [address] Phone: [phone]   Fax:  [phone]  Physical Therapy Treatment/Re-Cert   Patient Details  Name: Tricia [name] MRN: [id number] Date of Birth: February 12, 1945 Referring Provider (PT): Billey Chang   Encounter Date: 10/16/2020   PT End of Session - 10/16/20 1157     Visit Number 3    Number of Visits 12    Date for PT Re-Evaluation 11/27/20    Authorization Type HTA    PT Start Time 1100    PT Stop Time 1142    PT Time Calculation (min) 42 min    Activity Tolerance Patient tolerated treatment well    Behavior During Therapy [facility] for tasks assessed/performed             Past Medical History:  Diagnosis Date   Adenomatous polyp of colon 02/2004   Anemia    Anxiety    Arthritis    B12 deficiency    CAD (coronary artery disease)    a. s/p remote BMS to LAD;  b. 09/2015 Inf STEMI/VF Arrest: LM nl, LAD 85p (staged PCI 2 days later w/ 3.0x32 Synergy DES), 40p/m, 25d, LCX 69m RCA 80ost/100p (2.75x32 Synergy DES), 628mEF 55-65%. // c. Myoview 2/18: EF 59, no ischemia or infarction; Normal study // Myoview 3/22: EF 70, no ischemia or infarction; low risk   Chronic kidney disease, stage 3, mod decreased GFR (HCC) 03/09/2020   Dermatophytosis of groin and perianal area    Diet Controlled Diabetes Mellitus    Diverticulosis    GERD (gastroesophageal reflux disease)    H/O echocardiogram    a. 09/2015 Echo: EF 60-65%, no rwma, mild AI, mildly dil RA, mild to mod TR.   History of echocardiogram    Echo 3/22: EF 60-65, no RWMA, GR 1 DD, normal RVSF, mild LAE, RVSP 35.5, mild to moderate TR, trivial AI   Hyperlipidemia    Hypertensive heart disease    Low back pain    l5 disc   Lumbar spinal stenosis 03/03/2019   Morbid obesity ([facility]   Osteoporosis    PVC's (premature ventricular contractions) 04/16/2017   Holter 3/19: NSR, average heart rate 69, frequent PVCs (5% total beats), rare  supraventricular ectopics, no AF/flutter   Sleep apnea 10/03/2009   Resolved after gastric bypass     TOBACCO ABUSE 10/05/2009   Qualifier: Diagnosis of  By: CrStanford BreedMD, FAKandyce Rud  Ventricular fibrillation (HCO[place]08/28/2017   a. In setting of inferior STEMI.    Past Surgical History:  Procedure Laterality Date   ABDOMINAL HYSTERECTOMY     APPENDECTOMY     BARIATRIC SURGERY     CARDIAC CATHETERIZATION N/A 10/01/2015   Procedure: Left Heart Cath and Coronary Angiography;  Surgeon: DaLeonie ManMD;  [facility];  Service: Cardiovascular;  Laterality: N/A;   CARDIAC CATHETERIZATION N/A 10/01/2015   Procedure: Coronary Stent Intervention;  Surgeon: DaLeonie ManMD;  [facility];  Service: Cardiovascular;  Laterality: N/A;   CARDIAC CATHETERIZATION N/A 10/03/2015   Procedure: Coronary Stent Intervention;  Surgeon: ThTroy SineMD;  [facility];  Service: Cardiovascular;  Laterality: N/A;   CARDIAC CATHETERIZATION N/A 10/03/2015   Procedure: Coronary/Graft Angiography;  Surgeon: ThTroy SineMD;  [facility];  Service: Cardiovascular;  Laterality: N/A;   CARPAL TUNNEL RELEASE Bilateral    CERVICAL DISC SURGERY  CHOLECYSTECTOMY     CORONARY ANGIOPLASTY  2002   HERNIA REPAIR     LUMBAR LAMINECTOMY     L3-4   NEPHRECTOMY Right    Partial   TONSILLECTOMY     TOTAL KNEE ARTHROPLASTY Left    TUBAL LIGATION     ULNAR NERVE REPAIR Left     There were no vitals filed for this visit.   Subjective Assessment - 10/16/20 1123     Subjective Pt has been home for about a week, due to covid exposure, so she did less standing/walking activity. Her back has not been  painful because shes been doing less. Walking and carrying objects while walking is still difficult and causes pain.  Her L knee was hurting in the last few days, but is getting better. Has been using walker at home. States it is too heavy to  carry out of house.    Pertinent History CAD, CKD,    Currently in Pain? No/denies    Pain Score 0-No pain                [facility] PT Assessment - 10/16/20 0001       Assessment   Medical Diagnosis Back Pain    Referring Provider (PT) Billey Chang    Prior Therapy in 2019      Precautions   Precautions None      Posture/Postural Control   Posture Comments improved ability to self correct posture in seated.      AROM   Overall AROM Comments Mod limitation for flex, ext and rotation;  Hips: mild limitations for flexion and rotation      Strength   Overall Strength Comments HIps: 4-/5, Core: 3/5                           [facility] Adult PT Treatment/Exercise - 10/16/20 0001       Exercises   Exercises Lumbar      Lumbar Exercises: Stretches   Single Knee to Chest Stretch --    Pelvic Tilt 20 reps    Piriformis Stretch 2 reps;30 seconds    Piriformis Stretch Limitations supine modified  fig 4 stretch      Lumbar Exercises: Standing   Other Standing Lumbar Exercises scap retract x 15;    Other Standing Lumbar Exercises Walk, carry of 5 lb, then 10 lb, 60 ft each.with education on upright posture, using core, and using arm strength to hold.      Lumbar Exercises: Seated   Long Arc Quad on Chair --    LAQ on Chair Weights (lbs) --    Sit to Stand 10 reps    Other Seated Lumbar Exercises Rows seated, GTB x 15,      Lumbar Exercises: Supine   Clam 20 reps    Clam Limitations GTB with TA    Bridge Limitations 2x5    Straight Leg Raises Limitations 2x5 bil; with TA      Manual Therapy   Manual Therapy Joint mobilization;Soft tissue mobilization    Soft tissue mobilization --                       PT Short Term Goals - 10/16/20 1158       PT SHORT TERM GOAL #1   Title Pt to be independent with inital HEP for back    Time 2    Period Weeks    Status  Achieved    Target Date 09/19/20               PT Long Term Goals -  10/16/20 1158       PT LONG TERM GOAL #1   Title Pt to report decreased pain to 0-2/10 in lumbar region     Time 6    Period Weeks    Status On-going    Target Date 11/27/20      PT LONG TERM GOAL #2   Title Pt to demo improved Hip and core strength to at least 4/5 to improve stability and pain.     Time 6    Period Weeks    Status On-going    Target Date 11/27/20      PT LONG TERM GOAL #3   Title Pt to be independent with final HEP    Time 6    Period Weeks    Status Partially Met    Target Date 11/27/20      PT LONG TERM GOAL #4   Title Pt to demo ability for standing/walking at least 30 min without pain over 3/10, to improve ability for IADLS and community activity.    Time 6    Period Weeks    Status On-going    Target Date 11/27/20                   Plan - 10/16/20 1200     Clinical Impression Statement P has been seen for 3 visits. She has less pain today, because she has been less active. She continues to have increased pain with standing, walking, and IADLs. She still has decreased strength in hips, core, with poor stabiliazation, and difficulty holding upright posture. She is limited with ability for IADLs, and has a hard time with lifting, unloading, and carrying groceries. Pt will benefit from continued care to improve strength, endurance and ability for walking and carrying for improving functional activites at home and in community. Pt educated on use and benefits of lighter, 2 WW today, she did well using it, but prefers to use a 4 WW which she is unable to put into car.    Personal Factors and Comorbidities Time since onset of injury/illness/exacerbation;Past/Current Experience    Examination-Activity Limitations Lift;Stand;Locomotion Level;Squat;Stairs    Examination-Participation Restrictions Cleaning;Meal Prep;Community Activity;Shop;Laundry    Stability/Clinical Decision Making Stable/Uncomplicated    Rehab Potential Good    PT Frequency 2x / week     PT Duration 6 weeks    PT Treatment/Interventions ADLs/Self Care Home Management;Cryotherapy;Electrical Stimulation;Iontophoresis 87m/ml Dexamethasone;Moist Heat;Traction;Ultrasound;DME Instruction;Gait training;Stair training;Functional mobility training;Therapeutic activities;Therapeutic exercise;Balance training;Patient/family education;Neuromuscular re-education;Manual techniques;Passive range of motion;Dry needling;Spinal Manipulations;Joint Manipulations;Splinting    PT Home Exercise Plan [id number]   Consulted and Agree with Plan of Care Patient             Patient will benefit from skilled therapeutic intervention in order to improve the following deficits and impairments:  Improper body mechanics, Pain, Increased muscle spasms, Decreased mobility, Decreased activity tolerance, Decreased range of motion, Decreased strength, Impaired flexibility, Difficulty walking  Visit Diagnosis: Chronic bilateral low back pain with right-sided sciatica     Problem List Patient Active Problem List   Diagnosis Date Noted   Poor balance 09/12/2020   Type 2 diabetes with nephropathy ([facility] 08/22/2020   Chronic kidney disease, stage 3, mod decreased GFR ([facility] 03/09/2020   History of partial nephrectomy 09/08/2019   Laryngopharyngeal reflux (LPR) 09/06/2019   Major depression, recurrent,  chronic ([place]) 03/04/2019   Lumbar spinal stenosis 03/03/2019   Bilateral lower extremity edema 11/21/2018   PVC's (premature ventricular contractions) 04/16/2017   Hyperparathyroidism, secondary ([place]) 07/06/2016   COPD (chronic obstructive pulmonary disease) ([place]) 04/19/2016   Coronary artery disease involving native coronary artery of native heart with angina pectoris ([place]) 03/10/2016   History of cardiac arrest 10/01/2015   History of ST elevation myocardial infarction (STEMI) 10/01/2015   GERD (gastroesophageal reflux disease), Rx Protonix 02/14/2014   Status post gastric bypass for obesity 02/14/2014    Former smoker, 50 pack years, quit 2010 02/14/2014   Anemia, B12 deficiency 04/04/2009   Osteoporosis 12/14/2006   Hyperlipidemia associated with type 2 diabetes mellitus ([place]) 09/22/2006   Hypertension associated with diabetes ([place]) 09/22/2006   Osteoarthritis, multiple sites 09/22/2006   Degenerative joint disease (DJD) of lumbar spine 09/22/2006   Adenomatous polyp of colon 02/04/2004    Lyndee Hensen, PT, DPT 12:09 PM  10/16/20    [facility] [address] [place], [place], [address] Phone: [phone]   Fax:  [phone]  Name: Tricia [name] MRN: [id number] Date of Birth: 1945-09-28   PHYSICAL THERAPY DISCHARGE SUMMARY  Visits from Start of Care: 3 Plan: Patient agrees to discharge.  Patient goals were partially met. Patient is being discharged due   to - not returning since last visit.

## 2020-10-23 DIAGNOSIS — H40033 Anatomical narrow angle, bilateral: Secondary | ICD-10-CM | POA: Diagnosis not present

## 2020-10-23 DIAGNOSIS — Z961 Presence of intraocular lens: Secondary | ICD-10-CM | POA: Diagnosis not present

## 2020-10-23 DIAGNOSIS — H18413 Arcus senilis, bilateral: Secondary | ICD-10-CM | POA: Diagnosis not present

## 2020-10-23 DIAGNOSIS — H26492 Other secondary cataract, left eye: Secondary | ICD-10-CM | POA: Diagnosis not present

## 2020-10-23 DIAGNOSIS — H26493 Other secondary cataract, bilateral: Secondary | ICD-10-CM | POA: Diagnosis not present

## 2020-10-26 IMAGING — DX PORTABLE CHEST - 1 VIEW
1 series · 1 of 1 positions shown · non-contrast
Comparison: CT chest dated April 19, 2018. Chest x-ray dated September 09, 2016.

CLINICAL DATA: Shortness of breath and weakness.

EXAM:
PORTABLE CHEST 1 VIEW

[chest ap]
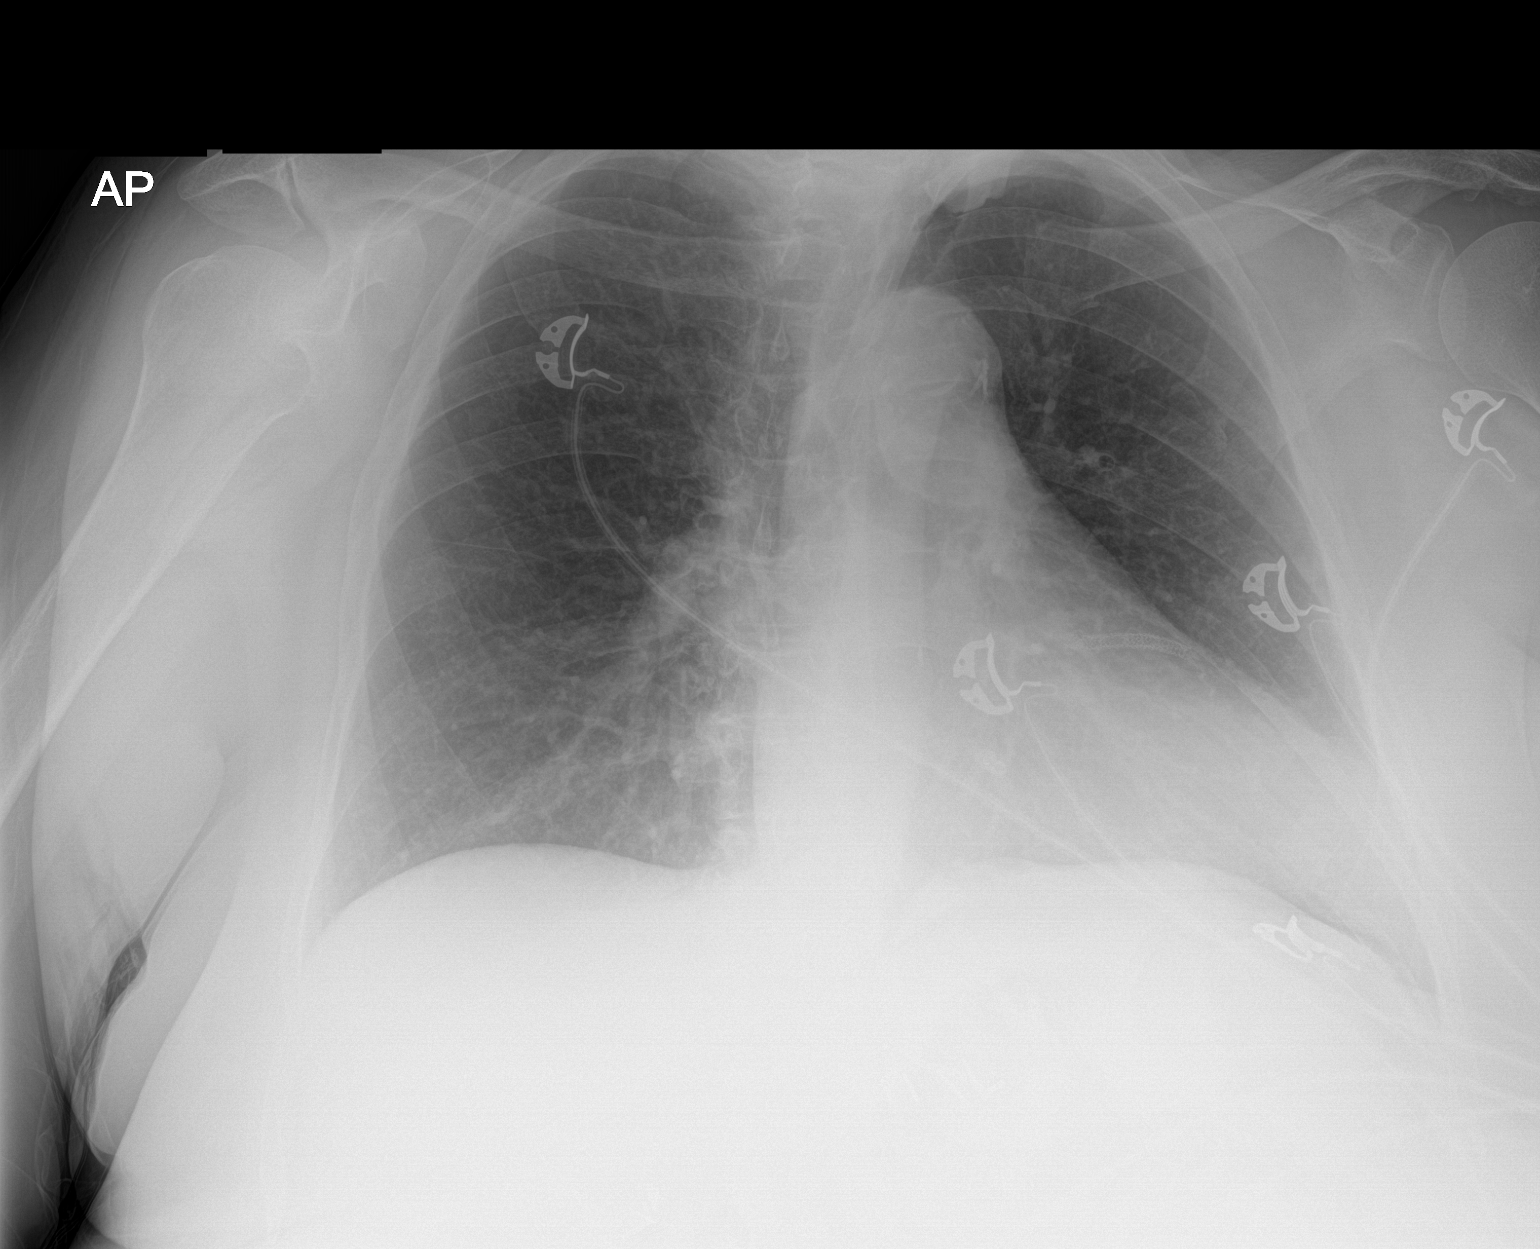

[1 of 1 positions shown; findings below may reference images not displayed]

FINDINGS: The patient is rotated to the left. The heart size and mediastinal
contours are within normal limits. Atherosclerotic calcification of
the aortic arch. Normal pulmonary vascularity. No focal
consolidation, pleural effusion, or pneumothorax. No acute osseous
abnormality.
IMPRESSION: No active disease.

## 2020-11-13 ENCOUNTER — Other Ambulatory Visit: Payer: Self-pay | Admitting: Family Medicine

## 2020-11-13 ENCOUNTER — Other Ambulatory Visit: Payer: Self-pay

## 2020-11-13 ENCOUNTER — Telehealth: Payer: Self-pay

## 2020-11-13 DIAGNOSIS — M159 Polyosteoarthritis, unspecified: Secondary | ICD-10-CM

## 2020-11-13 MED ORDER — HYDROCODONE-ACETAMINOPHEN 10-325 MG PO TABS
1.0000 | ORAL_TABLET | Freq: Every day | ORAL | 0 refills | Status: DC | PRN
Start: 2020-11-13 — End: 2020-11-13

## 2020-11-13 MED ORDER — HYDROCODONE-ACETAMINOPHEN 10-325 MG PO TABS
1.0000 | ORAL_TABLET | Freq: Every day | ORAL | 0 refills | Status: DC | PRN
Start: 2020-11-13 — End: 2021-02-27

## 2020-11-13 NOTE — Telephone Encounter (Signed)
Sent request to provider

## 2020-11-13 NOTE — Telephone Encounter (Signed)
  Encourage patient to contact the pharmacy for refills or they can request refills through [place]: 09/12/20  NEXT APPOINTMENT DATE: 12/13/2020   MEDICATION: HYDROcodone-acetaminophen (NORCO) 10-325 MG tablet  Is the patient out of medication? no  PHARMACY: [id number] [place], [place]  Let patient know to contact pharmacy at the end of the day to make sure medication is ready.  Please notify patient to allow 48-72 hours to process

## 2020-11-13 NOTE — Telephone Encounter (Signed)
Last Refill 08/22/2020 Last OV 09/12/2020 dx annual physical exam

## 2020-11-26 ENCOUNTER — Telehealth: Payer: Self-pay

## 2020-11-26 DIAGNOSIS — G8929 Other chronic pain: Secondary | ICD-10-CM

## 2020-11-26 NOTE — Telephone Encounter (Signed)
Please advise 

## 2020-11-26 NOTE — Telephone Encounter (Signed)
Pt called wanting a referral to Acupuncture. Pt stated that she wants acupuncture done for her back pain. Can referral be placed? Please Advise.

## 2020-11-27 NOTE — Telephone Encounter (Signed)
Patient states that she spoke with her insurance company and they let her know that she needs a referral to have acupuncture performed. She would like to have this done due to her chronic lower back

## 2020-11-28 NOTE — Telephone Encounter (Signed)
Spoke with patient, gave a verbal understanding °

## 2020-11-28 NOTE — Telephone Encounter (Signed)
Please let her know that the referral has been placed

## 2020-12-06 ENCOUNTER — Telehealth: Payer: Self-pay

## 2020-12-06 NOTE — Telephone Encounter (Signed)
Pt called stating that she needs a refill for Nitrolingual Spray. Pt stated that she needs the prescription order faxed to [phone]. Pt stated that it is [facility]. Please Advise.

## 2020-12-07 NOTE — Telephone Encounter (Signed)
Are you okay with this?

## 2020-12-07 NOTE — Telephone Encounter (Signed)
Spoke with patient, gave a verbal understanding °

## 2020-12-11 ENCOUNTER — Encounter (HOSPITAL_BASED_OUTPATIENT_CLINIC_OR_DEPARTMENT_OTHER): Payer: Self-pay | Admitting: Nurse Practitioner

## 2020-12-11 ENCOUNTER — Ambulatory Visit (INDEPENDENT_AMBULATORY_CARE_PROVIDER_SITE_OTHER): Payer: PPO | Admitting: Nurse Practitioner

## 2020-12-11 ENCOUNTER — Other Ambulatory Visit: Payer: Self-pay | Admitting: Cardiovascular Disease

## 2020-12-11 ENCOUNTER — Other Ambulatory Visit: Payer: Self-pay

## 2020-12-11 VITALS — BP 110/60 | HR 61 | Ht 62.0 in | Wt 219.8 lb

## 2020-12-11 DIAGNOSIS — M898X1 Other specified disorders of bone, shoulder: Secondary | ICD-10-CM

## 2020-12-11 DIAGNOSIS — Z9884 Bariatric surgery status: Secondary | ICD-10-CM

## 2020-12-11 DIAGNOSIS — N1831 Chronic kidney disease, stage 3a: Secondary | ICD-10-CM

## 2020-12-11 DIAGNOSIS — G8929 Other chronic pain: Secondary | ICD-10-CM | POA: Insufficient documentation

## 2020-12-11 DIAGNOSIS — I2510 Atherosclerotic heart disease of native coronary artery without angina pectoris: Secondary | ICD-10-CM

## 2020-12-11 MED ORDER — LIDOCAINE 4 % EX PTCH: 1.0000 | MEDICATED_PATCH | Freq: Two times a day (BID) | CUTANEOUS | 11 refills | Status: DC | PRN

## 2020-12-11 MED ORDER — NITROGLYCERIN 0.4 MG/SPRAY TL SOLN: 1.0000 | 3 refills | Status: DC | PRN

## 2020-12-11 NOTE — Progress Notes (Signed)
Tricia Render, DNP, AGNP-c [facility] [address]  [place], [place] [address] [phone]  New patient visit   Patient: Tricia [name]   DOB: March 28, 1945   75 y.o. Female  MRN: [id number] Visit Date: 12/11/2020  Patient Care Team: Janae Bonser, Coralee Pesa, NP as PCP - General (Nurse Practitioner) Sherren Mocha, MD as PCP - Cardiology (Cardiology) Ladene Artist, MD as Consulting Physician (Gastroenterology) Sharmon Revere as Physician Assistant (Cardiology) Gardiner Barefoot, DPM as Consulting Physician (Podiatry) Sherren Mocha, MD as Consulting Physician (Cardiology) Jovita Gamma, MD as Consulting Physician (Neurosurgery) Madelin Rear, [facility] as Pharmacist (Pharmacist)  Today's healthcare provider: Orma Render, NP   Chief Complaint  Patient presents with   Establish Care    Prior PCP - Billey Chang (in [place]) Patient also see [facility], and [facility]   Chronic Kidney Disease    Patient has hx of ckd states she hasnt been managed because her prior pcp told her she didn't need any management with nephrology. Patient is retaining fluid in her lower right extremity   Shoulder Pain    Patient presents with complaints of left sided scapula pain that radiates towards her chest. She has noticed the pain for 2-3 weeks   Subjective    Tricia [name] is a 75 y.o. female who presents today as a new patient to establish care.  Shoulder Pain    Tricia [name] has concerns today of left scapular pain that has been present for about 3 weeks  She has no injury or fall to the area  She has no rash. She has had her shingles vaccine She describes the pain as burning sensation that is worse when standing.  She tells me if she doesn't do anything with her arms and lifting or rests the pain will go away and stay gone for about 3-4 days. When she starts moving around again the pain returns.  It does not radiate.   She tells me that a  few weeks ago she felt pain in the center of her back that felt as though it was radiating to right under her chest.  She reports she has had 2 heart attacks in the past, but the pain was different for each one. The second was a cardiac arrest event in 2017. This pain was different.  She denies shortness of breath, sweating, nausea, palpitations, neck pain, jaw pain, or arm pain.  She reports the episode lasted about 2-3 hours and then subsided  Her nitroglycerine was expired for over 2 years, therefore she did not try this at the time She hasn't had any further episodes.   She has a history of CKD stage 3a. She reports that she was unaware of the diagnosis until she noticed abnormality on her labs and she asked about it. She has not seen nephrology for this.  She is taking [place] for her DM and CKD. She has been on this for 3 months.   She would like to have labs.   Past Medical History:  Diagnosis Date   Adenomatous polyp of colon 02/2004   Allergy 2000   Sulfur   Anemia    Anxiety    Arthritis    B12 deficiency    CAD (coronary artery disease)    a. s/p remote BMS to LAD;  b. 09/2015 Inf STEMI/VF Arrest: LM nl, LAD 85p (staged PCI 2 days later w/ 3.0x32 Synergy DES), 40p/m, 25d, LCX 48m,  RCA 80ost/100p (2.75x32 Synergy DES), 1m, EF 55-65%. // c. Myoview 2/18: EF 59, no ischemia or infarction; Normal study // Myoview 3/22: EF 70, no ischemia or infarction; low risk   Chronic kidney disease, stage 3, mod decreased GFR (HCC) 03/09/2020   COPD (chronic obstructive pulmonary disease) ([place]) 2018   Chest X-ray   Dermatophytosis of groin and perianal area    Diet Controlled Diabetes Mellitus    Diverticulosis    GERD (gastroesophageal reflux disease)    H/O echocardiogram    a. 09/2015 Echo: EF 60-65%, no rwma, mild AI, mildly dil RA, mild to mod TR.   History of echocardiogram    Echo 3/22: EF 60-65, no RWMA, GR 1 DD, normal RVSF, mild LAE, RVSP 35.5, mild to moderate TR, trivial AI    Hyperlipidemia    Hypertension    Hypertensive heart disease    Low back pain    l5 disc   Lumbar spinal stenosis 03/03/2019   Morbid obesity ([place])    Myocardial infarction ([place]) 2001, 2017   Neuromuscular disorder ([place])    Osteoporosis    PVC's (premature ventricular contractions) 04/16/2017   Holter 3/19: NSR, average heart rate 69, frequent PVCs (5% total beats), rare supraventricular ectopics, no AF/flutter   Sleep apnea 10/03/2009   Resolved after gastric bypass     TOBACCO ABUSE 10/05/2009   Qualifier: Diagnosis of  By: Stanford Breed, MD, Kandyce Rud    Ventricular fibrillation ([place]) 10/01/2015   a. In setting of inferior STEMI.   Past Surgical History:  Procedure Laterality Date   ABDOMINAL HYSTERECTOMY     APPENDECTOMY     BARIATRIC SURGERY     CARDIAC CATHETERIZATION N/A 10/01/2015   Procedure: Left Heart Cath and Coronary Angiography;  Surgeon: Leonie Man, MD;  Location: [facility];  Service: Cardiovascular;  Laterality: N/A;   CARDIAC CATHETERIZATION N/A 10/01/2015   Procedure: Coronary Stent Intervention;  Surgeon: Leonie Man, MD;  Location: [facility];  Service: Cardiovascular;  Laterality: N/A;   CARDIAC CATHETERIZATION N/A 10/03/2015   Procedure: Coronary Stent Intervention;  Surgeon: Troy Sine, MD;  Location: [facility];  Service: Cardiovascular;  Laterality: N/A;   CARDIAC CATHETERIZATION N/A 10/03/2015   Procedure: Coronary/Graft Angiography;  Surgeon: Troy Sine, MD;  Location: [facility];  Service: Cardiovascular;  Laterality: N/A;   CARPAL TUNNEL RELEASE Bilateral    CERVICAL DISC SURGERY     CHOLECYSTECTOMY     CORONARY ANGIOPLASTY  2002   EYE SURGERY  2020 January   Cataracts   HERNIA REPAIR     JOINT REPLACEMENT  2003   Knee   LUMBAR LAMINECTOMY     L3-4   NEPHRECTOMY Right    Partial   SPINE SURGERY     C1   TONSILLECTOMY     TOTAL KNEE ARTHROPLASTY Left    TUBAL LIGATION     ULNAR NERVE  REPAIR Left    Family Status  Relation Name Status   Mother Tricia [name] Deceased   Father Tricia [name] Deceased at age 70       had CABG   Mat Uncle Tricia [name] (Not Specified)   Mat Aunt  (Not Specified)   Mat Uncle  (Not Specified)   MGM Hasia (Not Specified)   PGF Geralyn Flash (Not Specified)   PGM Dora (Not Specified)   [place] (Not Specified)   Mat Aggie Hacker (Not Specified)   Brother 25 brother (Not Specified)  Sister Butch Penny (Not Specified)   Daughter Hollie Salk (Not Specified)   Mat Aunt Pnina (Not Specified)   Neg Hx  (Not Specified)   Family History  Problem Relation Age of Onset   Colitis Mother        sepsis from c dif colitis   Cancer Mother    COPD Mother    Diabetes Mother    Heart disease Mother    Hyperlipidemia Mother    Hypertension Mother    Heart disease Father    Krishav Mamone death Father    Lung cancer Maternal Uncle    Cancer Maternal Uncle    Stomach cancer Maternal Aunt    Lung cancer Maternal Uncle    Cancer Maternal Grandmother    Hearing loss Paternal Grandfather    Hyperlipidemia Paternal Grandfather    Hypertension Paternal Grandfather    Stroke Paternal Grandfather    Cancer Paternal Grandmother    Cancer Maternal Aunt    Cancer Maternal Uncle    Cancer Brother    Diabetes Sister    Jordin Dambrosio death Sister    Learning disabilities Sister    Learning disabilities Daughter    Vision loss Maternal Aunt    Colon cancer Neg Hx    Esophageal cancer Neg Hx    Rectal cancer Neg Hx    Social History   Socioeconomic History   Marital status: Widowed    Spouse name: Not on file   Number of children: Not on file   Years of education: Not on file   Highest education level: Not on file  Occupational History   Occupation: retired    Fish farm manager: UNEMPLOYED  Tobacco Use   Smoking status: Former    Packs/day: 2.00    Years: 41.00    Pack years: 82.00    Types: Cigarettes    Quit date: 08/04/2015    Years since quitting: 5.3   Smokeless tobacco: Never    Tobacco comments:    Quit 2000 started again 2008, quit again 2011, started again 2014  Vaping Use   Vaping Use: Never used  Substance and Sexual Activity   Alcohol use: No   Drug use: No   Sexual activity: Not Currently    Birth control/protection: Post-menopausal  Other Topics Concern   Not on file  Social History Narrative   Widowed in 04/2012. 2 children. 3 grandkids. Lives alone. Completely indendent. Disabled/retired. Disabled-lifted computer paper. Hobbies: spend money-gamble.   Social Determinants of Health   Financial Resource Strain: Low Risk    Difficulty of Paying Living Expenses: Not hard at all  Food Insecurity: No Food Insecurity   Worried About Charity fundraiser in the Last Year: Never true   [place] in the Last Year: Never true  Transportation Needs: No Transportation Needs   Lack of Transportation (Medical): No   Lack of Transportation (Non-Medical): No  Physical Activity: Sufficiently Active   Days of Exercise per Week: 4 days   Minutes of Exercise per Session: 40 min  Stress: No Stress Concern Present   Feeling of Stress : Not at all  Social Connections: Socially Isolated   Frequency of Communication with Friends and Family: More than three times a week   Frequency of Social Gatherings with Friends and Family: Twice a week   Attends Religious Services: Never   Marine scientist or Organizations: No   Attends Archivist Meetings: Never   Marital Status: Widowed   Outpatient Medications Prior to Visit  Medication Sig   amLODipine (NORVASC) 10 MG tablet Take 1 tablet by mouth daily. **Dose increase**   aspirin EC 81 MG tablet Take 81 mg by mouth daily.   atorvastatin (LIPITOR) 80 MG tablet Take 1 tablet by mouth daily.   baclofen (LIORESAL) 10 MG tablet Take 1 tablet by mouth twice daily.   carvedilol (COREG) 6.25 MG tablet Take 1 tablet by mouth twice daily with a meal.   Cholecalciferol (VITAMIN D3 SUPER STRENGTH) 50 MCG (2000 UT)  TABS Take 1 tablet by mouth daily.   clopidogrel (PLAVIX) 75 MG tablet Take 1 tablet by mouth daily.   dapagliflozin propanediol (FARXIGA) 5 MG TABS tablet Take 1 tablet (5 mg total) by mouth daily.   ezetimibe (ZETIA) 10 MG tablet Take 1 tablet by mouth daily.   famotidine (PEPCID) 40 MG tablet Take 1 tablet by mouth at bedtime.   FLUZONE HIGH-DOSE QUADRIVALENT 0.7 ML SUSY    furosemide (LASIX) 20 MG tablet Take 1-2 tablets (20-40 mg total) by mouth as needed for fluid or edema.   gabapentin (NEURONTIN) 300 MG capsule Take 1 capsule (300 mg total) by mouth 3 (three) times daily.   glucose blood (FREESTYLE LITE) test strip TEST UP TO FOUR TIMES DAILY Dx: E11.22   HYDROcodone-acetaminophen (NORCO) 10-325 MG tablet Take 1 tablet by mouth daily as needed.   Lancets (FREESTYLE) lancets TEST UP TO FOUR TIMES DAILY Dx: E11.22   pantoprazole (PROTONIX) 40 MG tablet Take 1 tablet by mouth twice daily before meals.   PFIZER COVID-19 VAC BIVALENT injection    potassium chloride (KLOR-CON) 10 MEQ tablet TAKE ONE TABLET BY MOUTH WHEN YOU TAKE YOUR FUROSEMIDE FOR SWELLING.   prednisoLONE acetate (PRED FORTE) 1 % ophthalmic suspension 1 drop 4 (four) times daily.   PROAIR HFA 108 (90 Base) MCG/ACT inhaler Inhale 2 puffs into the lungs every 6 (six) hours as needed for wheezing or shortness of breath.   valsartan (DIOVAN) 320 MG tablet Take 1 tablet by mouth daily.   [DISCONTINUED] nitroGLYCERIN (NITROLINGUAL) 0.4 MG/SPRAY spray Place 1 spray under the tongue every 5 (five) minutes x 3 doses as needed for chest pain.   No facility-administered medications prior to visit.   Allergies  Allergen Reactions   Sulfa Antibiotics Diarrhea and Nausea And Vomiting    Immunization History  Administered Date(s) Administered   Fluad Quad(high Dose 65+) 10/07/2018, 10/18/2019   Influenza Split 01/21/2011   Influenza Whole 11/19/2007   Influenza, High Dose Seasonal PF 10/16/2015, 11/12/2016, 11/07/2017, 10/20/2020    Influenza-Unspecified 12/18/2013   PFIZER Comirnaty(Gray Top)Covid-19 Tri-Sucrose Vaccine 05/31/2020   PFIZER(Purple Top)SARS-COV-2 Vaccination 03/17/2019, 04/11/2019, 11/03/2019   Pfizer Covid-19 Vaccine Bivalent Booster 75yrs & up 10/20/2020   Pneumococcal Conjugate-13 08/26/2013   Pneumococcal Polysaccharide-23 02/04/2003, 06/08/2015   Td 02/03/2005   Zoster Recombinat (Shingrix) 02/18/2017, 07/19/2017    Health Maintenance  Topic Date Due   HEMOGLOBIN A1C  12/08/2020   FOOT EXAM  03/09/2021   MAMMOGRAM  04/23/2021   OPHTHALMOLOGY EXAM  09/14/2021   DEXA SCAN  09/14/2021   COLONOSCOPY (Pts 45-41yrs Insurance coverage will need to be confirmed)  05/03/2023   Pneumonia Vaccine 31+ Years old  Completed   INFLUENZA VACCINE  Completed   COVID-19 Vaccine  Completed   Hepatitis C Screening  Completed   Zoster Vaccines- Shingrix  Completed   HPV VACCINES  Aged Out   TETANUS/TDAP  Discontinued    Patient Care Team: Aylani Spurlock, Coralee Pesa, NP as PCP - General (Nurse Practitioner)  Sherren Mocha, MD as PCP - Cardiology (Cardiology) Ladene Artist, MD as Consulting Physician (Gastroenterology) Sharmon Revere as Physician Assistant (Cardiology) Gardiner Barefoot, DPM as Consulting Physician (Podiatry) Sherren Mocha, MD as Consulting Physician (Cardiology) Jovita Gamma, MD as Consulting Physician (Neurosurgery) Madelin Rear, [facility] as Pharmacist (Pharmacist)  Review of Systems All review of systems negative except what is listed in the HPI    Objective    BP 110/60   Pulse 61   Ht 5\' 2"  (1.575 m)   Wt 219 lb 12.8 oz (99.7 kg)   LMP  (LMP Unknown)   SpO2 98%   BMI 40.20 kg/m  Physical Exam Vitals and nursing note reviewed.  Constitutional:      General: She is not in acute distress.    Appearance: Normal appearance.  Eyes:     Extraocular Movements: Extraocular movements intact.     Conjunctiva/sclera: Conjunctivae normal.     Pupils: Pupils are equal, round, and  reactive to light.  Neck:     Vascular: No carotid bruit.  Cardiovascular:     Rate and Rhythm: Normal rate and regular rhythm.     Pulses: Normal pulses.     Heart sounds: Normal heart sounds. No murmur heard. Pulmonary:     Effort: Pulmonary effort is normal.     Breath sounds: Normal breath sounds. No wheezing.  Abdominal:     General: Bowel sounds are normal.     Palpations: Abdomen is soft.  Musculoskeletal:        General: Tenderness present. Normal range of motion.     Cervical back: Normal range of motion.     Right lower leg: No edema.     Left lower leg: No edema.     Comments: Tenderness to the left scapula with gentle palpation.   Skin:    General: Skin is warm and dry.     Capillary Refill: Capillary refill takes less than 2 seconds.  Neurological:     General: No focal deficit present.     Mental Status: She is alert and oriented to person, place, and time.  Psychiatric:        Mood and Affect: Mood normal.        Behavior: Behavior normal.        Thought Content: Thought content normal.        Judgment: Judgment normal.    Depression Screen PHQ 2/9 Scores 12/11/2020 09/12/2020 09/06/2020 03/09/2020  PHQ - 2 Score 0 0 0 0  PHQ- 9 Score 0 - - 0   No results found for any visits on 12/11/20.  Assessment & Plan      Problem List Items Addressed This Visit     Hx of gastric bypass   Relevant Orders   Ambulatory referral to Nephrology   CBC with Differential/Platelet   Comprehensive metabolic panel   Lipid panel   TSH   VITAMIN D 25 Hydroxy (Vit-D Deficiency, Fractures)   Fe+TIBC+Fer   Copper, serum   B12 and Folate Panel   Zinc   Parathyroid hormone, intact (no Ca)   Coronary artery disease involving native coronary artery of native heart without angina pectoris    Refill provided on nitroglycerine today Patient encouraged to call 911 if she develops chest pain not relieved with her nitroglycernin      Relevant Medications   nitroGLYCERIN  (NITROLINGUAL) 0.4 MG/SPRAY spray   Chronic kidney disease, stage 3, mod decreased GFR ([place]) - Primary    Will  recheck labs today Wilder Glade for the past 3 months. She has been holding lasix due to increased urination with her farxiga. There is mild LE edema present today.  Recommend 1 lasix every other day at this time to help with excess fluid retention.  Will send referral to nephrology for further evaluation.  She only has one kidney after surgical removal several years ago of right kidney, therefore close monitoring is recommended.  She is avoiding nephrotoxic medications.  Will review meds to ensure renal dosing.       Relevant Orders   Ambulatory referral to Nephrology   Comprehensive metabolic panel   Chronic scapular pain    Ongoing for three weeks.  Worse with increased spinal pressure from standing and movement.  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  Recommend ice and heat to the area. Lidocaine patches 12 hours on as needed can be helpful.  May also use voltaren gel when not using lidocaine patch.  Will review previous imaging.  If no improvement at 6 weeks will consider further imaging.       Relevant Medications   Lidocaine (HM LIDOCAINE PATCH) 4 % PTCH     Return in about 3 months (around 03/13/2021) for CKD, DM.    Time: 60 minutes, >50% spent counseling, care coordination, chart review, and documentation.    Khanh Tanori, Coralee Pesa, NP, DNP, AGNP-C [facility] at [place]

## 2020-12-11 NOTE — Assessment & Plan Note (Signed)
Will recheck labs today Farxiga for the past 3 months. She has been holding lasix due to increased urination with her farxiga. There is mild LE edema present today.  Recommend 1 lasix every other day at this time to help with excess fluid retention.  Will send referral to nephrology for further evaluation.  She only has one kidney after surgical removal several years ago of right kidney, therefore close monitoring is recommended.  She is avoiding nephrotoxic medications.  Will review meds to ensure renal dosing.

## 2020-12-11 NOTE — Assessment & Plan Note (Signed)
Refill provided on nitroglycerine today Patient encouraged to call 911 if she develops chest pain not relieved with her nitroglycernin

## 2020-12-11 NOTE — Patient Instructions (Addendum)
Thank you for choosing [facility] for your Primary Care needs. I am excited for the opportunity to partner with you to meet your health care goals. It was a pleasure meeting you today!  Recommendations from today's visit: Lasix: I recommend that you take this 3 days a week to help with the swelling in your legs. This is a safe dose for your kidneys, but be sure to ask the Kidney doctor about this dosing.  I have sent a prescription for the lidocaine patch for your scapula, but if this is not covered well under your insurance they can be purchased over the counter.  I sent in the prescription for the nitroglycerine spray.  I have ordered labs. If you want to have these done today we can have them completed or you can wait.    Information on diet, exercise, and health maintenance recommendations are listed below. This is information to help you be sure you are on track for optimal health and monitoring.   Please look over this and let us know if you have any questions or if you have completed any of the health maintenance outside of [place] so that we can be sure your records are up to date.  ___________________________________________________________ About Me: I am an Adult-Geriatric Nurse Practitioner with a background in caring for patients for more than 20 years with a strong intensive care background. I provide primary care and sports medicine services to patients age 89 and older within this office. My education had a strong focus on caring for the older adult population, which I am passionate about. I am also the director of the APP Fellowship with [facility].   My desire is to provide you with the best service through preventive medicine and supportive care. I consider you a part of the medical team and value your input. I work diligently to ensure that you are heard and your needs are met in a safe and effective manner. I want you to feel comfortable  with me as your provider and want you to know that your health concerns are important to me.  For your information, our office hours are: Monday, Tuesday, and Thursday 8:00 AM - 5:00 PM Wednesday and Friday 8:00 AM - 12:00 PM.   In my time away from the office I am teaching new APP's within the system and am unavailable, but my partner, Dr. Burnard Bunting is in the office for emergent needs.   If you have questions or concerns, please call our office at [phone] or send [place] a MyChart message and we will respond as quickly as possible.  ____________________________________________________________ MyChart:  For all urgent or time sensitive needs we ask that you please call the office to avoid delays. Our number is [phone]. MyChart is not constantly monitored and due to the large volume of messages a day, replies may take up to 72 business hours.  MyChart Policy: MyChart allows for you to see your visit notes, after visit summary, provider recommendations, lab and tests results, make an appointment, request refills, and contact your provider or the office for non-urgent questions or concerns. Providers are seeing patients during normal business hours and do not have built in time to review MyChart messages.  We ask that you allow a minimum of 3 business days for responses to [employer]. For this reason, please do not send urgent requests through [place]. Please call the office at [phone]. New and ongoing conditions may require a visit.  We have virtual and in person visit available for your convenience.  Complex MyChart concerns may require a visit. Your provider may request you schedule a virtual or in person visit to ensure we are providing the best care possible. MyChart messages sent after 11:00 AM on Friday will not be received by the provider until Monday morning.    Lab and Test Results: You will receive your lab and test results on MyChart as soon as they are completed and  results have been sent by the lab or testing facility. Due to this service, you will receive your results BEFORE your provider.  I review lab and tests results each morning prior to seeing patients. Some results require collaboration with other providers to ensure you are receiving the most appropriate care. For this reason, we ask that you please allow a minimum of 3-5 business days from the time the ALL results have been received for your provider to receive and review lab and test results and contact you about these.  Most lab and test result comments from the provider will be sent through [place]. Your provider may recommend changes to the plan of care, follow-up visits, repeat testing, ask questions, or request an office visit to discuss these results. You may reply directly to this message or call the office at [phone] to provide information for the provider or set up an appointment. In some instances, you will be called with test results and recommendations. Please let us know if this is preferred and we will make note of this in your chart to provide this for you.    If you have not heard a response to your lab or test results in 5 business days from all results returning to [place], please call the office to let us know. We ask that you please avoid calling prior to this time unless there is an emergent concern. Due to high call volumes, this can delay the resulting process.  After Hours: For all non-emergency after hours needs, please call the office at [phone] and select the option to reach the on-call provider service. On-call services are shared between multiple [place] offices and therefore it will not be possible to speak directly with your provider. On-call providers may provide medical advice and recommendations, but are unable to provide refills for maintenance medications.  For all emergency or urgent medical needs after normal business hours, we recommend that you seek care  at the closest Urgent Care or Emergency Department to ensure appropriate treatment in a timely manner.  [facility] [place] has a 24 hour emergency room located on the ground floor for your convenience.   Urgent Concerns During the Business Day Providers are seeing patients from 8AM to [place] with a busy schedule and are most often not able to respond to non-urgent calls until the end of the day or the next business day. If you should have URGENT concerns during the day, please call and speak to the nurse or schedule a same day appointment so that we can address your concern without delay.   Thank you, again, for choosing me as your health care partner. I appreciate your trust and look forward to learning more about you.   [name], DNP, AGNP-c ___________________________________________________________  Health Maintenance Recommendations Screening Testing Mammogram Every 1 -2 years based on history and risk factors Starting at age 78 Pap Smear Ages 21-39 every 3 years Ages 18-65 every 5 years with HPV testing More frequent testing may be required  based on results and history Colon Cancer Screening Every 1-10 years based on test performed, risk factors, and history Starting at age 13 Bone Density Screening Every 2-10 years based on history Starting at age 23 for women Recommendations for men differ based on medication usage, history, and risk factors AAA Screening One time ultrasound Men 59-6 years old who have every smoked Lung Cancer Screening Low Dose Lung CT every 12 months Age 28-80 years with a 30 pack-year smoking history who still smoke or who have quit within the last 15 years  Screening Labs Routine  Labs: Complete Blood Count (CBC), Complete Metabolic Panel (CMP), Cholesterol (Lipid Panel) Every 6-12 months based on history and medications May be recommended more frequently based on current conditions or previous results Hemoglobin A1c Lab Every  3-12 months based on history and previous results Starting at age 25 or earlier with diagnosis of diabetes, high cholesterol, BMI >26, and/or risk factors Frequent monitoring for patients with diabetes to ensure blood sugar control Thyroid Panel (TSH w/ T3 & T4) Every 6 months based on history, symptoms, and risk factors May be repeated more often if on medication HIV One time testing for all patients 28 and older May be repeated more frequently for patients with increased risk factors or exposure Hepatitis C One time testing for all patients 35 and older May be repeated more frequently for patients with increased risk factors or exposure Gonorrhea, Chlamydia Every 12 months for all sexually active persons 13-24 years Additional monitoring may be recommended for those who are considered high risk or who have symptoms PSA Men 66-54 years old with risk factors Additional screening may be recommended from age 73-69 based on risk factors, symptoms, and history  Vaccine Recommendations Tetanus Booster All adults every 10 years Flu Vaccine All patients 6 months and older every year COVID Vaccine All patients 12 years and older Initial dosing with booster May recommend additional booster based on age and health history HPV Vaccine 2 doses all patients age 57-26 Dosing may be considered for patients over 26 Shingles Vaccine (Shingrix) 2 doses all adults 93 years and older Pneumonia (Pneumovax 23) All adults 27 years and older May recommend earlier dosing based on health history Pneumonia (Prevnar 37) All adults 26 years and older Dosed 1 year after Pneumovax 23  Additional Screening, Testing, and Vaccinations may be recommended on an individualized basis based on family history, health history, risk factors, and/or exposure.  __________________________________________________________  Diet Recommendations for All Patients  I recommend that all patients maintain a diet low in  saturated fats, carbohydrates, and cholesterol. While this can be challenging at first, it is not impossible and small changes can make big differences.  Things to try: Decreasing the amount of soda, sweet tea, and/or juice to one or less per day and replace with water While water is always the first choice, if you do not like water you may consider adding a water additive without sugar to improve the taste other sugar free drinks Replace potatoes with a brightly colored vegetable at dinner Use healthy oils, such as canola oil or olive oil, instead of butter or hard margarine Limit your bread intake to two pieces or less a day Replace regular pasta with low carb pasta options Bake, broil, or grill foods instead of frying Monitor portion sizes  Eat smaller, more frequent meals throughout the day instead of large meals  An important thing to remember is, if you love foods that are not great for your  health, you don't have to give them up completely. Instead, allow these foods to be a reward when you have done well. Allowing yourself to still have special treats every once in a while is a nice way to tell yourself thank you for working hard to keep yourself healthy.   Also remember that every day is a new day. If you have a bad day and "fall off the wagon", you can still climb right back up and keep moving along on your journey!  We have resources available to help you!  Some websites that may be helpful include: www.http://carter.biz/  Www.VeryWell[employer].com _____________________________________________________________  Activity Recommendations for All Patients  I recommend that all adults get at least 20 minutes of moderate physical activity that elevates your heart rate at least 5 days out of the week.  Some examples include: Walking or jogging at a pace that allows you to carry on a conversation Cycling (stationary bike or outdoors) Water aerobics Yoga Weight lifting Dancing If physical  limitations prevent you from putting stress on your joints, exercise in a pool or seated in a chair are excellent options.  Do determine your MAXIMUM heart rate for activity: YOUR AGE - 220 = MAX HeartRate   Remember! Do not push yourself too hard.  Start slowly and build up your pace, speed, weight, time in exercise, etc.  Allow your body to rest between exercise and get good sleep. You will need more water than normal when you are exerting yourself. Do not wait until you are thirsty to drink. Drink with a purpose of getting in at least 8, 8 ounce glasses of water a day plus more depending on how much you exercise and sweat.    If you begin to develop dizziness, chest pain, abdominal pain, jaw pain, shortness of breath, headache, vision changes, lightheadedness, or other concerning symptoms, stop the activity and allow your body to rest. If your symptoms are severe, seek emergency evaluation immediately. If your symptoms are concerning, but not severe, please let us know so that we can recommend further evaluation.

## 2020-12-11 NOTE — Assessment & Plan Note (Signed)
Ongoing for three weeks.  Worse with increased spinal pressure from standing and movement.  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  Recommend ice and heat to the area. Lidocaine patches 12 hours on as needed can be helpful.  May also use voltaren gel when not using lidocaine patch.  Will review previous imaging.  If no improvement at 6 weeks will consider further imaging.

## 2020-12-11 NOTE — Assessment & Plan Note (Signed)
Extensive lab review today due to previous surgery.  Will make changes to plan of care based on lab results.

## 2020-12-12 LAB — VITAMIN D 25 HYDROXY (VIT D DEFICIENCY, FRACTURES): Vit D, 25-Hydroxy: 48.5 ng/mL (ref 30.0–100.0)

## 2020-12-12 LAB — LIPID PANEL
Chol/HDL Ratio: 3.2 ratio (ref 0.0–4.4)
Cholesterol, Total: 138 mg/dL (ref 100–199)
HDL: 43 mg/dL (ref >39)
LDL Chol Calc (NIH): 70 mg/dL (ref 0–99)
Triglycerides: 144 mg/dL (ref 0–149)
VLDL Cholesterol Cal: 25 mg/dL (ref 5–40)

## 2020-12-12 LAB — CBC WITH DIFFERENTIAL/PLATELET
Basophils Absolute: 0.1 10*3/uL (ref 0.0–0.2)
Basos: 1 %
EOS (ABSOLUTE): 0.2 10*3/uL (ref 0.0–0.4)
Eos: 2 %
Hematocrit: 43.8 % (ref 34.0–46.6)
Hemoglobin: 13.8 g/dL (ref 11.1–15.9)
Immature Grans (Abs): 0.1 10*3/uL (ref 0.0–0.1)
Immature Granulocytes: 1 %
Lymphocytes Absolute: 1.4 10*3/uL (ref 0.7–3.1)
Lymphs: 16 %
MCH: 26.3 pg — ABNORMAL LOW (ref 26.6–33.0)
MCHC: 31.5 g/dL (ref 31.5–35.7)
MCV: 84 fL (ref 79–97)
Monocytes Absolute: 0.5 10*3/uL (ref 0.1–0.9)
Monocytes: 6 %
Neutrophils Absolute: 6.3 10*3/uL (ref 1.4–7.0)
Neutrophils: 74 %
Platelets: 284 10*3/uL (ref 150–450)
RBC: 5.24 x10E6/uL (ref 3.77–5.28)
RDW: 13 % (ref 11.7–15.4)
WBC: 8.6 10*3/uL (ref 3.4–10.8)

## 2020-12-12 LAB — COMPREHENSIVE METABOLIC PANEL
ALT: 10 IU/L (ref 0–32)
AST: 17 IU/L (ref 0–40)
Albumin/Globulin Ratio: 1.9 (ref 1.2–2.2)
Albumin: 4.5 g/dL (ref 3.7–4.7)
Alkaline Phosphatase: 189 IU/L — ABNORMAL HIGH (ref 44–121)
BUN/Creatinine Ratio: 28 (ref 12–28)
BUN: 33 mg/dL — ABNORMAL HIGH (ref 8–27)
Bilirubin Total: 0.3 mg/dL (ref 0.0–1.2)
CO2: 21 mmol/L (ref 20–29)
Calcium: 10.2 mg/dL (ref 8.7–10.3)
Chloride: 108 mmol/L — ABNORMAL HIGH (ref 96–106)
Creatinine, Ser: 1.17 mg/dL — ABNORMAL HIGH (ref 0.57–1.00)
Globulin, Total: 2.4 g/dL (ref 1.5–4.5)
Glucose: 119 mg/dL — ABNORMAL HIGH (ref 70–99)
Potassium: 5.3 mmol/L — ABNORMAL HIGH (ref 3.5–5.2)
Sodium: 144 mmol/L (ref 134–144)
Total Protein: 6.9 g/dL (ref 6.0–8.5)
eGFR: 49 mL/min/{1.73_m2} — ABNORMAL LOW (ref >59)

## 2020-12-12 LAB — TSH: TSH: 3.23 u[IU]/mL (ref 0.450–4.500)

## 2020-12-13 ENCOUNTER — Ambulatory Visit: Payer: PPO | Admitting: Family Medicine

## 2020-12-17 ENCOUNTER — Encounter (HOSPITAL_BASED_OUTPATIENT_CLINIC_OR_DEPARTMENT_OTHER): Payer: Self-pay | Admitting: Nurse Practitioner

## 2020-12-20 LAB — B12 AND FOLATE PANEL

## 2020-12-20 LAB — IRON,TIBC AND FERRITIN PANEL
Ferritin: 44 ng/mL (ref 15–150)
Iron Saturation: 16 % (ref 15–55)
Iron: 64 ug/dL (ref 27–139)
Total Iron Binding Capacity: 400 ug/dL (ref 250–450)
UIBC: 336 ug/dL (ref 118–369)

## 2020-12-20 LAB — COPPER, SERUM: Copper: 184 ug/dL — ABNORMAL HIGH (ref 80–158)

## 2020-12-20 LAB — PARATHYROID HORMONE, INTACT (NO CA): PTH: 50 pg/mL (ref 15–65)

## 2020-12-20 LAB — ZINC: Zinc: 63 ug/dL (ref 44–115)

## 2021-01-07 ENCOUNTER — Telehealth (HOSPITAL_BASED_OUTPATIENT_CLINIC_OR_DEPARTMENT_OTHER): Payer: Self-pay | Admitting: Nurse Practitioner

## 2021-01-07 ENCOUNTER — Other Ambulatory Visit (HOSPITAL_BASED_OUTPATIENT_CLINIC_OR_DEPARTMENT_OTHER): Payer: PPO | Admitting: Nurse Practitioner

## 2021-01-07 NOTE — Telephone Encounter (Signed)
Pt called regarding labs that were scheudled if she needd ot have them or not would like nurse to call her. Please advise.

## 2021-01-11 ENCOUNTER — Other Ambulatory Visit: Payer: Self-pay | Admitting: Cardiovascular Disease

## 2021-01-11 ENCOUNTER — Other Ambulatory Visit: Payer: Self-pay | Admitting: Nurse Practitioner

## 2021-01-11 DIAGNOSIS — M5441 Lumbago with sciatica, right side: Secondary | ICD-10-CM

## 2021-01-11 DIAGNOSIS — G8929 Other chronic pain: Secondary | ICD-10-CM

## 2021-01-16 ENCOUNTER — Encounter (HOSPITAL_BASED_OUTPATIENT_CLINIC_OR_DEPARTMENT_OTHER): Payer: Self-pay | Admitting: Nurse Practitioner

## 2021-02-06 ENCOUNTER — Other Ambulatory Visit: Payer: Self-pay | Admitting: Nurse Practitioner

## 2021-02-27 ENCOUNTER — Other Ambulatory Visit: Payer: Self-pay | Admitting: Family Medicine

## 2021-02-27 ENCOUNTER — Encounter (HOSPITAL_BASED_OUTPATIENT_CLINIC_OR_DEPARTMENT_OTHER): Payer: Self-pay | Admitting: Nurse Practitioner

## 2021-02-27 ENCOUNTER — Other Ambulatory Visit: Payer: Self-pay

## 2021-02-27 DIAGNOSIS — M159 Polyosteoarthritis, unspecified: Secondary | ICD-10-CM

## 2021-03-01 ENCOUNTER — Other Ambulatory Visit (HOSPITAL_BASED_OUTPATIENT_CLINIC_OR_DEPARTMENT_OTHER): Payer: Self-pay | Admitting: Nurse Practitioner

## 2021-03-04 MED ORDER — HYDROCODONE-ACETAMINOPHEN 10-325 MG PO TABS: 1.0000 | ORAL_TABLET | Freq: Every day | ORAL | 0 refills | Status: DC | PRN

## 2021-03-05 DIAGNOSIS — N2581 Secondary hyperparathyroidism of renal origin: Secondary | ICD-10-CM | POA: Diagnosis not present

## 2021-03-05 DIAGNOSIS — D631 Anemia in chronic kidney disease: Secondary | ICD-10-CM | POA: Diagnosis not present

## 2021-03-05 DIAGNOSIS — Z905 Acquired absence of kidney: Secondary | ICD-10-CM | POA: Diagnosis not present

## 2021-03-05 DIAGNOSIS — N390 Urinary tract infection, site not specified: Secondary | ICD-10-CM | POA: Diagnosis not present

## 2021-03-05 DIAGNOSIS — M1990 Unspecified osteoarthritis, unspecified site: Secondary | ICD-10-CM | POA: Diagnosis not present

## 2021-03-05 DIAGNOSIS — N1831 Chronic kidney disease, stage 3a: Secondary | ICD-10-CM | POA: Diagnosis not present

## 2021-03-05 DIAGNOSIS — I2119 ST elevation (STEMI) myocardial infarction involving other coronary artery of inferior wall: Secondary | ICD-10-CM | POA: Diagnosis not present

## 2021-03-08 ENCOUNTER — Other Ambulatory Visit: Payer: Self-pay | Admitting: Gastroenterology

## 2021-03-08 ENCOUNTER — Other Ambulatory Visit: Payer: Self-pay | Admitting: Physician Assistant

## 2021-03-12 NOTE — Progress Notes (Signed)
Established Patient Office Visit  Subjective:  Patient ID: Tricia [name], female    DOB: [birth year]  Age: 76 y.o. MRN: [id number]  CC:  Chief Complaint  Patient presents with   Follow-up    Patient presents today for follow up DM, she stated she is doing great. Sugar is running 90-120. She is weaning off the baclofen per kidney doctor it is not good for her kidneys. She would like labs done today as well as an pneumonia shot.     HPI Tricia [name] presents for follow-up for DM, CKD, HTN.  DM Tricia [name] tells me her BG is well controlled and running between 90-120 when fasting.  She is taking her medication as prescribed and not missing any doses.  She is following a diabetic diet.  She has had no sx of low BG or high BG.   CKD She is weaning off of baclofen per nephrology due to kidney concerns.  She is not having any new or worsening symptoms.  No edema, cough, ShOB, or sudden weight gain.  She is urinating normally.   HTN Well controlled. Taking medication as prescribed with no missed doses.  No CP, palpitations, HA, dizziness, or edema.   She expresses to me that her insurance is no longer covering Zetia but will cover the compound medication with zetia and simvastatin. She would like to know if she can switch to this formulation for cost effectiveness.   Outpatient Medications Prior to Visit  Medication Sig Dispense Refill   amLODipine (NORVASC) 10 MG tablet Take 1 tablet (10 mg total) by mouth daily. Please make yearly appt with Dr. Burt Knack for March 2023 before anymore refills. Thank you Final Attempt 30 tablet 3   aspirin EC 81 MG tablet Take 81 mg by mouth daily.     carvedilol (COREG) 6.25 MG tablet Take 1 tablet by mouth twice daily with a meal. 180 tablet 0   Cholecalciferol (VITAMIN D) 50 MCG (2000 UT) tablet Take 1 tablet by mouth daily. 90 tablet 2   clopidogrel (PLAVIX) 75 MG tablet Take 1 tablet by mouth daily. 90 tablet 2   dapagliflozin propanediol (FARXIGA) 5  MG TABS tablet Take 1 tablet (5 mg total) by mouth daily. 90 tablet 3   famotidine (PEPCID) 40 MG tablet Take 1 tablet by mouth at bedtime. 30 tablet 0   FLUZONE HIGH-DOSE QUADRIVALENT 0.7 ML SUSY      furosemide (LASIX) 20 MG tablet Take 1-2 tablets (20-40 mg total) by mouth as needed for fluid or edema. 120 tablet 3   gabapentin (NEURONTIN) 300 MG capsule Take 1 capsule (300 mg total) by mouth 3 (three) times daily. 270 capsule 0   glucose blood (FREESTYLE LITE) test strip TEST UP TO FOUR TIMES DAILY Dx: E11.22 360 each 5   Lancets (FREESTYLE) lancets TEST UP TO FOUR TIMES DAILY Dx: E11.22 360 each 5   Lidocaine (HM LIDOCAINE PATCH) 4 % PTCH Apply 1 patch topically every 12 (twelve) hours as needed (for scapular pain). 15 patch 11   nitroGLYCERIN (NITROLINGUAL) 0.4 MG/SPRAY spray Place 1 spray under the tongue every 5 (five) minutes x 3 doses as needed for chest pain. 4.9 g 3   pantoprazole (PROTONIX) 40 MG tablet Take 1 tablet by mouth twice daily before meals. 60 tablet 0   PFIZER COVID-19 VAC BIVALENT injection      potassium chloride (KLOR-CON) 10 MEQ tablet TAKE ONE TABLET BY MOUTH WHEN YOU TAKE YOUR FUROSEMIDE FOR SWELLING. 90 tablet  0   prednisoLONE acetate (PRED FORTE) 1 % ophthalmic suspension 1 drop 4 (four) times daily.     PROAIR HFA 108 (90 Base) MCG/ACT inhaler Inhale 2 puffs into the lungs every 6 (six) hours as needed for wheezing or shortness of breath. 18 g 3   valsartan (DIOVAN) 320 MG tablet Take 1 tablet by mouth daily. 90 tablet 1   atorvastatin (LIPITOR) 80 MG tablet Take 1 tablet by mouth daily. 90 tablet 2   baclofen (LIORESAL) 10 MG tablet Take 1 tablet (10 mg total) by mouth 2 (two) times daily. 180 tablet 0   ezetimibe (ZETIA) 10 MG tablet Take 1 tablet by mouth daily. 90 tablet 1   HYDROcodone-acetaminophen (NORCO) 10-325 MG tablet Take 1 tablet by mouth daily as needed. 30 tablet 0   No facility-administered medications prior to visit.    Allergies  Allergen  Reactions   Sulfa Antibiotics Diarrhea and Nausea And Vomiting    ROS Review of Systems All review of systems negative except what is listed in the HPI    Objective:    Physical Exam Vitals and nursing note reviewed.  Constitutional:      Appearance: Normal appearance.  HENT:     Head: Normocephalic.  Eyes:     Extraocular Movements: Extraocular movements intact.     Conjunctiva/sclera: Conjunctivae normal.     Pupils: Pupils are equal, round, and reactive to light.  Neck:     Vascular: No carotid bruit.  Cardiovascular:     Rate and Rhythm: Normal rate and regular rhythm.     Pulses: Normal pulses.     Heart sounds: Normal heart sounds.  Pulmonary:     Effort: Pulmonary effort is normal.     Breath sounds: Normal breath sounds.  Musculoskeletal:     Cervical back: Normal range of motion.     Right lower leg: No edema.     Left lower leg: No edema.  Skin:    General: Skin is warm and dry.     Capillary Refill: Capillary refill takes less than 2 seconds.  Neurological:     General: No focal deficit present.     Mental Status: She is alert and oriented to person, place, and time.  Psychiatric:        Mood and Affect: Mood normal.        Behavior: Behavior normal.        Thought Content: Thought content normal.        Judgment: Judgment normal.    BP 117/72    LMP  (LMP Unknown)  Wt Readings from Last 3 Encounters:  12/11/20 219 lb 12.8 oz (99.7 kg)  09/12/20 232 lb 9.6 oz (105.5 kg)  08/22/20 228 lb 12.8 oz (103.8 kg)     Health Maintenance Due  Topic Date Due   HEMOGLOBIN A1C  12/08/2020   FOOT EXAM  03/09/2021    There are no preventive care reminders to display for this patient.  Lab Results  Component Value Date   TSH 3.230 12/11/2020   Lab Results  Component Value Date   WBC 8.6 12/11/2020   HGB 13.8 12/11/2020   HCT 43.8 12/11/2020   MCV 84 12/11/2020   PLT 284 12/11/2020   Lab Results  Component Value Date   NA 144 12/11/2020   K 5.3  (H) 12/11/2020   CO2 21 12/11/2020   GLUCOSE 119 (H) 12/11/2020   BUN 33 (H) 12/11/2020   CREATININE 1.17 (H) 12/11/2020  BILITOT 0.3 12/11/2020   ALKPHOS 189 (H) 12/11/2020   AST 17 12/11/2020   ALT 10 12/11/2020   PROT 6.9 12/11/2020   ALBUMIN 4.5 12/11/2020   CALCIUM 10.2 12/11/2020   ANIONGAP 10 11/03/2019   EGFR 49 (L) 12/11/2020   GFR 53.50 (L) 09/12/2020   Lab Results  Component Value Date   CHOL 138 12/11/2020   Lab Results  Component Value Date   HDL 43 12/11/2020   Lab Results  Component Value Date   LDLCALC 70 12/11/2020   Lab Results  Component Value Date   TRIG 144 12/11/2020   Lab Results  Component Value Date   CHOLHDL 3.2 12/11/2020   Lab Results  Component Value Date   HGBA1C 6.7 (H) 06/07/2020      Assessment & Plan:   Problem List Items Addressed This Visit     Hyperlipidemia associated with type 2 diabetes mellitus ([place])    Chronic. Will change statin therapy to zetia simvastatin combo pill as this is a more affordable option for her.  Labs today. No alarm symptoms at this time.  Will make changes to plan of care based on labs.  F/U in 3 months or sooner if needed.       Relevant Medications   ezetimibe-simvastatin (VYTORIN) 10-80 MG tablet   Hypertension associated with diabetes ([place]) - Primary    BP well controlled at this time with no alarm symptoms. Medication refilled today.  Will obtain labs today and monitor for any changes needed.  F/U in 3 months or sooner if needed.       Relevant Medications   ezetimibe-simvastatin (VYTORIN) 10-80 MG tablet   Other Relevant Orders   CBC with Differential/Platelet   Comprehensive metabolic panel   Iron, TIBC and Ferritin Panel   Urine Microalbumin w/creat. ratio   Protein / creatinine ratio, urine   Hemoglobin A1c   Osteoarthritis, multiple sites    Refill on medication today. Stable on current tx.       Relevant Medications   HYDROcodone-acetaminophen (NORCO) 10-325 MG tablet    Chronic kidney disease, stage 3, mod decreased GFR (HCC)    Continue to wean off of Baclofen and monitor for any new or worsening symptoms.  Will obtain labs today for further evaluation.  No alarm symptoms present.  Continue to follow with nephrology.      Relevant Medications   ezetimibe-simvastatin (VYTORIN) 10-80 MG tablet   Other Relevant Orders   CBC with Differential/Platelet   Comprehensive metabolic panel   Iron, TIBC and Ferritin Panel   VITAMIN D 25 Hydroxy (Vit-D Deficiency, Fractures)   Urine Microalbumin w/creat. ratio   Protein / creatinine ratio, urine   Type 2 diabetes with nephropathy (HCC)    BG well controlled on home monitoring.  Diet and activity appropriate.  No new symptoms or concerning findings today.  Will obtain labs for further evaluation and make changes to plan of care as necessary.  Continue to follow with nephrology.  F/U in 3 months or sooner if needed.       Relevant Medications   ezetimibe-simvastatin (VYTORIN) 10-80 MG tablet   Other Relevant Orders   CBC with Differential/Platelet   Comprehensive metabolic panel   Iron, TIBC and Ferritin Panel   Urine Microalbumin w/creat. ratio   Protein / creatinine ratio, urine   Hemoglobin A1c   Other Visit Diagnoses     Abnormal blood level of copper       Relevant Orders   Copper,  serum   Vitamin D deficiency       Relevant Orders   VITAMIN D 25 Hydroxy (Vit-D Deficiency, Fractures)       Meds ordered this encounter  Medications   ezetimibe-simvastatin (VYTORIN) 10-80 MG tablet    Sig: Take 1 tablet by mouth at bedtime.    Dispense:  90 tablet    Refill:  3   HYDROcodone-acetaminophen (NORCO) 10-325 MG tablet    Sig: Take 1 tablet by mouth daily as needed.    Dispense:  30 tablet    Refill:  0    Follow-up: Return in about 3 months (around 06/10/2021) for DM, CKD.    Orma Render, NP

## 2021-03-13 ENCOUNTER — Ambulatory Visit (INDEPENDENT_AMBULATORY_CARE_PROVIDER_SITE_OTHER): Payer: PPO | Admitting: Nurse Practitioner

## 2021-03-13 ENCOUNTER — Encounter (HOSPITAL_BASED_OUTPATIENT_CLINIC_OR_DEPARTMENT_OTHER): Payer: Self-pay | Admitting: Nurse Practitioner

## 2021-03-13 ENCOUNTER — Other Ambulatory Visit: Payer: Self-pay

## 2021-03-13 VITALS — BP 117/72 | HR 72 | Ht 61.0 in | Wt 218.0 lb

## 2021-03-13 DIAGNOSIS — E1169 Type 2 diabetes mellitus with other specified complication: Secondary | ICD-10-CM

## 2021-03-13 DIAGNOSIS — I152 Hypertension secondary to endocrine disorders: Secondary | ICD-10-CM

## 2021-03-13 DIAGNOSIS — E785 Hyperlipidemia, unspecified: Secondary | ICD-10-CM | POA: Diagnosis not present

## 2021-03-13 DIAGNOSIS — E1121 Type 2 diabetes mellitus with diabetic nephropathy: Secondary | ICD-10-CM

## 2021-03-13 DIAGNOSIS — M159 Polyosteoarthritis, unspecified: Secondary | ICD-10-CM | POA: Diagnosis not present

## 2021-03-13 DIAGNOSIS — E559 Vitamin D deficiency, unspecified: Secondary | ICD-10-CM

## 2021-03-13 DIAGNOSIS — R790 Abnormal level of blood mineral: Secondary | ICD-10-CM | POA: Diagnosis not present

## 2021-03-13 DIAGNOSIS — N1831 Chronic kidney disease, stage 3a: Secondary | ICD-10-CM | POA: Diagnosis not present

## 2021-03-13 DIAGNOSIS — E1159 Type 2 diabetes mellitus with other circulatory complications: Secondary | ICD-10-CM

## 2021-03-13 MED ORDER — EZETIMIBE-SIMVASTATIN 10-80 MG PO TABS: 1.0000 | ORAL_TABLET | Freq: Every day | ORAL | 3 refills | Status: DC

## 2021-03-13 NOTE — Patient Instructions (Addendum)
You are not required to limit your fluid intake based on your current kidney function, but I do want you to monitor and let me know if you notice worsening swelling in your feet or ankles, wet cough, ,or shortness of breath. These could be signs of too much fluid. A good rule of thumb is to try to drink 64 ounces of water a day for overall health.   I have sent in the combined pill for the ezetimibe and simvastatin. When you start this I want you to discontinue taking the ezetimibe and atorvastatin separately.

## 2021-03-15 ENCOUNTER — Encounter (HOSPITAL_BASED_OUTPATIENT_CLINIC_OR_DEPARTMENT_OTHER): Payer: Self-pay | Admitting: Nurse Practitioner

## 2021-03-15 MED ORDER — HYDROCODONE-ACETAMINOPHEN 10-325 MG PO TABS: 1.0000 | ORAL_TABLET | Freq: Every day | ORAL | 0 refills | Status: DC | PRN

## 2021-03-15 NOTE — Assessment & Plan Note (Signed)
Continue to wean off of Baclofen and monitor for any new or worsening symptoms.  Will obtain labs today for further evaluation.  No alarm symptoms present.  Continue to follow with nephrology.

## 2021-03-15 NOTE — Assessment & Plan Note (Signed)
BG well controlled on home monitoring.  Diet and activity appropriate.  No new symptoms or concerning findings today.  Will obtain labs for further evaluation and make changes to plan of care as necessary.  Continue to follow with nephrology.  F/U in 3 months or sooner if needed.

## 2021-03-15 NOTE — Assessment & Plan Note (Signed)
BP well controlled at this time with no alarm symptoms. Medication refilled today.  Will obtain labs today and monitor for any changes needed.  F/U in 3 months or sooner if needed.

## 2021-03-15 NOTE — Assessment & Plan Note (Signed)
Chronic. Will change statin therapy to zetia simvastatin combo pill as this is a more affordable option for her.  Labs today. No alarm symptoms at this time.  Will make changes to plan of care based on labs.  F/U in 3 months or sooner if needed.

## 2021-03-15 NOTE — Assessment & Plan Note (Signed)
Refill on medication today. Stable on current tx.

## 2021-03-16 ENCOUNTER — Encounter (HOSPITAL_BASED_OUTPATIENT_CLINIC_OR_DEPARTMENT_OTHER): Payer: Self-pay | Admitting: Nurse Practitioner

## 2021-03-16 LAB — PROTEIN / CREATININE RATIO, URINE
Creatinine, Urine: 82.4 mg/dL
Protein, Ur: 19.8 mg/dL
Protein/Creat Ratio: 240 mg/g creat — ABNORMAL HIGH (ref 0–200)

## 2021-03-16 LAB — HEMOGLOBIN A1C
Est. average glucose Bld gHb Est-mCnc: 146 mg/dL
Hgb A1c MFr Bld: 6.7 % — ABNORMAL HIGH (ref 4.8–5.6)

## 2021-03-16 LAB — COMPREHENSIVE METABOLIC PANEL
ALT: 12 IU/L (ref 0–32)
AST: 13 IU/L (ref 0–40)
Albumin/Globulin Ratio: 1.7 (ref 1.2–2.2)
Albumin: 4.3 g/dL (ref 3.7–4.7)
Alkaline Phosphatase: 203 IU/L — ABNORMAL HIGH (ref 44–121)
BUN/Creatinine Ratio: 32 — ABNORMAL HIGH (ref 12–28)
BUN: 43 mg/dL — ABNORMAL HIGH (ref 8–27)
Bilirubin Total: 0.4 mg/dL (ref 0.0–1.2)
CO2: 26 mmol/L (ref 20–29)
Calcium: 9.7 mg/dL (ref 8.7–10.3)
Chloride: 103 mmol/L (ref 96–106)
Creatinine, Ser: 1.33 mg/dL — ABNORMAL HIGH (ref 0.57–1.00)
Globulin, Total: 2.5 g/dL (ref 1.5–4.5)
Glucose: 136 mg/dL — ABNORMAL HIGH (ref 70–99)
Potassium: 4.6 mmol/L (ref 3.5–5.2)
Sodium: 142 mmol/L (ref 134–144)
Total Protein: 6.8 g/dL (ref 6.0–8.5)
eGFR: 42 mL/min/{1.73_m2} — ABNORMAL LOW (ref >59)

## 2021-03-16 LAB — CBC WITH DIFFERENTIAL/PLATELET
Basophils Absolute: 0.1 10*3/uL (ref 0.0–0.2)
Basos: 1 %
EOS (ABSOLUTE): 0.3 10*3/uL (ref 0.0–0.4)
Eos: 4 %
Hematocrit: 41.5 % (ref 34.0–46.6)
Hemoglobin: 13.1 g/dL (ref 11.1–15.9)
Immature Grans (Abs): 0 10*3/uL (ref 0.0–0.1)
Immature Granulocytes: 0 %
Lymphocytes Absolute: 1.9 10*3/uL (ref 0.7–3.1)
Lymphs: 25 %
MCH: 26.1 pg — ABNORMAL LOW (ref 26.6–33.0)
MCHC: 31.6 g/dL (ref 31.5–35.7)
MCV: 83 fL (ref 79–97)
Monocytes Absolute: 0.5 10*3/uL (ref 0.1–0.9)
Monocytes: 7 %
Neutrophils Absolute: 4.6 10*3/uL (ref 1.4–7.0)
Neutrophils: 63 %
Platelets: 252 10*3/uL (ref 150–450)
RBC: 5.01 x10E6/uL (ref 3.77–5.28)
RDW: 13.7 % (ref 11.7–15.4)
WBC: 7.5 10*3/uL (ref 3.4–10.8)

## 2021-03-16 LAB — MICROALBUMIN / CREATININE URINE RATIO
Microalb/Creat Ratio: 10 mg/g creat (ref 0–29)
Microalbumin, Urine: 8.6 ug/mL

## 2021-03-16 LAB — IRON,TIBC AND FERRITIN PANEL
Ferritin: 24 ng/mL (ref 15–150)
Iron Saturation: 20 % (ref 15–55)
Iron: 70 ug/dL (ref 27–139)
Total Iron Binding Capacity: 343 ug/dL (ref 250–450)
UIBC: 273 ug/dL (ref 118–369)

## 2021-03-16 LAB — VITAMIN D 25 HYDROXY (VIT D DEFICIENCY, FRACTURES): Vit D, 25-Hydroxy: 45.9 ng/mL (ref 30.0–100.0)

## 2021-03-16 LAB — COPPER, SERUM: Copper: 146 ug/dL (ref 80–158)

## 2021-03-21 ENCOUNTER — Encounter (HOSPITAL_BASED_OUTPATIENT_CLINIC_OR_DEPARTMENT_OTHER): Payer: Self-pay | Admitting: Nurse Practitioner

## 2021-03-25 DIAGNOSIS — H26491 Other secondary cataract, right eye: Secondary | ICD-10-CM | POA: Diagnosis not present

## 2021-03-28 ENCOUNTER — Encounter (HOSPITAL_BASED_OUTPATIENT_CLINIC_OR_DEPARTMENT_OTHER): Payer: Self-pay | Admitting: Nurse Practitioner

## 2021-03-28 ENCOUNTER — Other Ambulatory Visit (HOSPITAL_BASED_OUTPATIENT_CLINIC_OR_DEPARTMENT_OTHER): Payer: Self-pay | Admitting: Nurse Practitioner

## 2021-03-28 DIAGNOSIS — E1169 Type 2 diabetes mellitus with other specified complication: Secondary | ICD-10-CM

## 2021-03-28 DIAGNOSIS — J449 Chronic obstructive pulmonary disease, unspecified: Secondary | ICD-10-CM

## 2021-03-28 DIAGNOSIS — E1121 Type 2 diabetes mellitus with diabetic nephropathy: Secondary | ICD-10-CM

## 2021-03-28 DIAGNOSIS — I152 Hypertension secondary to endocrine disorders: Secondary | ICD-10-CM

## 2021-03-28 DIAGNOSIS — I2510 Atherosclerotic heart disease of native coronary artery without angina pectoris: Secondary | ICD-10-CM

## 2021-03-28 DIAGNOSIS — N183 Chronic kidney disease, stage 3 unspecified: Secondary | ICD-10-CM

## 2021-03-28 DIAGNOSIS — Z1231 Encounter for screening mammogram for malignant neoplasm of breast: Secondary | ICD-10-CM

## 2021-04-06 ENCOUNTER — Encounter (HOSPITAL_BASED_OUTPATIENT_CLINIC_OR_DEPARTMENT_OTHER): Payer: Self-pay | Admitting: Nurse Practitioner

## 2021-04-06 DIAGNOSIS — Z0000 Encounter for general adult medical examination without abnormal findings: Secondary | ICD-10-CM

## 2021-04-08 MED ORDER — PROAIR HFA 108 (90 BASE) MCG/ACT IN AERS: 2.0000 | INHALATION_SPRAY | Freq: Four times a day (QID) | RESPIRATORY_TRACT | 3 refills | Status: DC | PRN

## 2021-04-11 DIAGNOSIS — H02831 Dermatochalasis of right upper eyelid: Secondary | ICD-10-CM | POA: Diagnosis not present

## 2021-04-11 DIAGNOSIS — H18413 Arcus senilis, bilateral: Secondary | ICD-10-CM | POA: Diagnosis not present

## 2021-04-11 DIAGNOSIS — H26491 Other secondary cataract, right eye: Secondary | ICD-10-CM | POA: Diagnosis not present

## 2021-04-11 DIAGNOSIS — Z961 Presence of intraocular lens: Secondary | ICD-10-CM | POA: Diagnosis not present

## 2021-04-12 ENCOUNTER — Telehealth: Payer: Self-pay | Admitting: Physician Assistant

## 2021-04-12 NOTE — Telephone Encounter (Signed)
[employer] called and want to confirm that the patient suffers from CHF to continue in a problem she is currently in. Please call back to verify. Call Janett Billow at [phone]  ?

## 2021-04-24 MED ORDER — FREESTYLE LIBRE 3 SENSOR MISC: 1.0000 [IU] | 11 refills | Status: DC

## 2021-04-25 ENCOUNTER — Encounter: Payer: Self-pay | Admitting: Gastroenterology

## 2021-04-25 ENCOUNTER — Ambulatory Visit: Payer: PPO | Admitting: Gastroenterology

## 2021-04-25 VITALS — BP 127/62 | HR 67 | Ht 61.0 in | Wt 223.0 lb

## 2021-04-25 DIAGNOSIS — Z86010 Personal history of colonic polyps: Secondary | ICD-10-CM

## 2021-04-25 DIAGNOSIS — K219 Gastro-esophageal reflux disease without esophagitis: Secondary | ICD-10-CM

## 2021-04-25 MED ORDER — PANTOPRAZOLE SODIUM 40 MG PO TBEC: 40.0000 mg | DELAYED_RELEASE_TABLET | Freq: Two times a day (BID) | ORAL | 11 refills | Status: DC

## 2021-04-25 NOTE — Patient Instructions (Signed)
We have sent the following medications to your pharmacy for you to pick up at your convenience: pantoprazole.   The [facility] providers would like to encourage you to use MYCHART to communicate with providers for non-urgent requests or questions.  Due to long hold times on the telephone, sending your provider a message by MYCHART may be a faster and more efficient way to get a response.  Please allow 48 business hours for a response.  Please remember that this is for non-urgent requests.   Thank you for choosing me and [facility].  Malcolm T. Stark, Jr., MD., FACG   

## 2021-04-25 NOTE — Progress Notes (Signed)
? ? ?  History of Present Illness: This is a 76 year old female with GERD and LPR.  She has been maintained on pantoprazole 40 mg p.o. twice daily and famotidine at bedtime.  She has occasional breakthrough symptoms however her LPR symptoms have been well controlled on this regimen.  She discontinued famotidine a few weeks ago and has noted a few episodes of vocal fatigue since then. ? ?Current Medications, Allergies, Past Medical History, Past Surgical History, Family History and Social History were reviewed in [employer] record. ? ? ?Physical Exam: ?General: Well developed, well nourished, no acute distress ?Head: Normocephalic and atraumatic ?Eyes: Sclerae anicteric, EOMI ?Ears: Normal auditory acuity ?Mouth: Not examined, mask on during Covid-19 pandemic ?Lungs: Clear throughout to auscultation ?Heart: Regular rate and rhythm; no murmurs, rubs or bruits ?Abdomen: Soft, non tender and non distended. No masses, hepatosplenomegaly or hernias noted. Normal Bowel sounds ?Rectal: Not done  ?Musculoskeletal: Symmetrical with no gross deformities  ?Pulses:  Normal pulses noted ?Extremities: No clubbing, cyanosis, edema or deformities noted ?Neurological: Alert oriented x 4, grossly nonfocal ?Psychological:  Alert and cooperative. Normal mood and affect ? ? ?Assessment and Recommendations: ? ?GERD with LPR.  Follow antireflux measures and continue pantoprazole 40 mg p.o. twice daily taken AC.  If vocal fatigue or other LPR symptoms are more active resume famotidine 40 mg p.o. at bedtime.  Closely follow antireflux measures long-term. ?S/P gastric bypass.  ?Personal history of multiple adenomatous colon polyps.  Surveillance colonoscopy recommended in March 2025. ?

## 2021-04-26 ENCOUNTER — Other Ambulatory Visit: Payer: Self-pay

## 2021-04-26 ENCOUNTER — Other Ambulatory Visit (HOSPITAL_BASED_OUTPATIENT_CLINIC_OR_DEPARTMENT_OTHER): Payer: Self-pay | Admitting: Nurse Practitioner

## 2021-04-26 ENCOUNTER — Encounter (HOSPITAL_BASED_OUTPATIENT_CLINIC_OR_DEPARTMENT_OTHER): Payer: Self-pay | Admitting: Radiology

## 2021-04-26 ENCOUNTER — Ambulatory Visit (HOSPITAL_BASED_OUTPATIENT_CLINIC_OR_DEPARTMENT_OTHER)
Admission: RE | Admit: 2021-04-26 | Discharge: 2021-04-26 | Disposition: A | Discharge status: Home / Self Care | Payer: PPO | Source: Ambulatory Visit | Attending: Nurse Practitioner | Admitting: Nurse Practitioner

## 2021-04-26 ENCOUNTER — Ambulatory Visit (HOSPITAL_BASED_OUTPATIENT_CLINIC_OR_DEPARTMENT_OTHER)
Admission: RE | Admit: 2021-04-26 | Discharge: 2021-04-26 | Disposition: A | Discharge status: Home / Self Care | Payer: PPO | Source: Ambulatory Visit | Attending: Acute Care | Admitting: Acute Care

## 2021-04-26 DIAGNOSIS — Z1231 Encounter for screening mammogram for malignant neoplasm of breast: Secondary | ICD-10-CM

## 2021-04-26 DIAGNOSIS — Z87891 Personal history of nicotine dependence: Secondary | ICD-10-CM | POA: Insufficient documentation

## 2021-04-26 DIAGNOSIS — E1159 Type 2 diabetes mellitus with other circulatory complications: Secondary | ICD-10-CM

## 2021-04-26 DIAGNOSIS — I2510 Atherosclerotic heart disease of native coronary artery without angina pectoris: Secondary | ICD-10-CM

## 2021-04-26 DIAGNOSIS — J449 Chronic obstructive pulmonary disease, unspecified: Secondary | ICD-10-CM

## 2021-04-26 DIAGNOSIS — E1169 Type 2 diabetes mellitus with other specified complication: Secondary | ICD-10-CM

## 2021-04-26 DIAGNOSIS — Z0000 Encounter for general adult medical examination without abnormal findings: Secondary | ICD-10-CM

## 2021-04-26 DIAGNOSIS — E1121 Type 2 diabetes mellitus with diabetic nephropathy: Secondary | ICD-10-CM

## 2021-04-26 DIAGNOSIS — N183 Chronic kidney disease, stage 3 unspecified: Secondary | ICD-10-CM

## 2021-04-26 MED ORDER — FREESTYLE LIBRE 3 SENSOR MISC: 1.0000 [IU] | 11 refills | Status: DC

## 2021-04-26 MED ORDER — PROAIR HFA 108 (90 BASE) MCG/ACT IN AERS: 2.0000 | INHALATION_SPRAY | Freq: Four times a day (QID) | RESPIRATORY_TRACT | 3 refills | Status: DC | PRN

## 2021-04-29 ENCOUNTER — Ambulatory Visit (HOSPITAL_BASED_OUTPATIENT_CLINIC_OR_DEPARTMENT_OTHER): Payer: PPO

## 2021-04-29 ENCOUNTER — Ambulatory Visit (HOSPITAL_BASED_OUTPATIENT_CLINIC_OR_DEPARTMENT_OTHER): Payer: PPO | Admitting: Radiology

## 2021-04-30 ENCOUNTER — Telehealth (HOSPITAL_BASED_OUTPATIENT_CLINIC_OR_DEPARTMENT_OTHER): Payer: Self-pay

## 2021-04-30 ENCOUNTER — Other Ambulatory Visit: Payer: Self-pay

## 2021-04-30 DIAGNOSIS — Z87891 Personal history of nicotine dependence: Secondary | ICD-10-CM

## 2021-04-30 NOTE — Telephone Encounter (Signed)
Received a form from [employer] to verify that patient has diabetes. The form was signed and faxed back to [phone] ?

## 2021-05-01 ENCOUNTER — Other Ambulatory Visit (HOSPITAL_BASED_OUTPATIENT_CLINIC_OR_DEPARTMENT_OTHER): Payer: Self-pay

## 2021-05-01 DIAGNOSIS — G8929 Other chronic pain: Secondary | ICD-10-CM

## 2021-05-01 MED ORDER — GABAPENTIN 300 MG PO CAPS: 300.0000 mg | ORAL_CAPSULE | Freq: Three times a day (TID) | ORAL | 0 refills | Status: DC

## 2021-05-11 ENCOUNTER — Other Ambulatory Visit: Payer: Self-pay | Admitting: Physician Assistant

## 2021-05-13 ENCOUNTER — Other Ambulatory Visit: Payer: Self-pay | Admitting: *Deleted

## 2021-05-13 MED ORDER — AMLODIPINE BESYLATE 10 MG PO TABS: 10.0000 mg | ORAL_TABLET | Freq: Every day | ORAL | 0 refills | Status: DC

## 2021-05-29 ENCOUNTER — Ambulatory Visit: Payer: PPO | Admitting: Physician Assistant

## 2021-05-30 ENCOUNTER — Ambulatory Visit (INDEPENDENT_AMBULATORY_CARE_PROVIDER_SITE_OTHER): Payer: PPO | Admitting: Family

## 2021-05-30 ENCOUNTER — Encounter (HOSPITAL_BASED_OUTPATIENT_CLINIC_OR_DEPARTMENT_OTHER): Payer: Self-pay | Admitting: Family

## 2021-05-30 VITALS — BP 104/60 | HR 71 | Ht 61.0 in | Wt 218.8 lb

## 2021-05-30 DIAGNOSIS — I10 Essential (primary) hypertension: Secondary | ICD-10-CM

## 2021-05-30 DIAGNOSIS — E785 Hyperlipidemia, unspecified: Secondary | ICD-10-CM

## 2021-05-30 DIAGNOSIS — I493 Ventricular premature depolarization: Secondary | ICD-10-CM | POA: Diagnosis not present

## 2021-05-30 DIAGNOSIS — I872 Venous insufficiency (chronic) (peripheral): Secondary | ICD-10-CM

## 2021-05-30 DIAGNOSIS — N1832 Chronic kidney disease, stage 3b: Secondary | ICD-10-CM | POA: Diagnosis not present

## 2021-05-30 DIAGNOSIS — I25118 Atherosclerotic heart disease of native coronary artery with other forms of angina pectoris: Secondary | ICD-10-CM | POA: Diagnosis not present

## 2021-05-30 MED ORDER — CLOPIDOGREL BISULFATE 75 MG PO TABS: 75.0000 mg | ORAL_TABLET | Freq: Every day | ORAL | 3 refills | Status: DC

## 2021-05-30 MED ORDER — CARVEDILOL 6.25 MG PO TABS: 6.2500 mg | ORAL_TABLET | Freq: Two times a day (BID) | ORAL | 3 refills | Status: DC

## 2021-05-30 MED ORDER — AMLODIPINE BESYLATE 10 MG PO TABS: 10.0000 mg | ORAL_TABLET | Freq: Every day | ORAL | 3 refills | Status: DC

## 2021-05-30 NOTE — Patient Instructions (Signed)
Medication Instructions:  ?Your Physician recommend you continue on your current medication as directed.   ? ?*If you need a refill on your cardiac medications before your next appointment, please call your pharmacy* ? ? ?Lab Work: ?None ordered today  ? ?Testing/Procedures: ?None ordered today  ? ? ?Follow-Up: ?At [facility], you and your health needs are our priority.  As part of our continuing mission to provide you with exceptional heart care, we have created designated Provider Care Teams.  These Care Teams include your primary Cardiologist (physician) and Advanced Practice Providers (APPs -  Physician Assistants and Nurse Practitioners) who all work together to provide you with the care you need, when you need it. ? ?We recommend signing up for the patient portal called "MyChart".  Sign up information is provided on this After Visit Summary.  MyChart is used to connect with patients for Virtual Visits (Telemedicine).  Patients are able to view lab/test results, encounter notes, upcoming appointments, etc.  Non-urgent messages can be sent to your provider as well.   ?To learn more about what you can do with MyChart, go to [email or web address].   ? ?Your next appointment:   ?1 year(s) ? ?The format for your next appointment:   ?In Person ? ?Provider:   ?Laurann Montana, NP{ ? ?Other Instructions ?Heart Healthy Diet Recommendations: ?A low-salt diet is recommended. Meats should be grilled, baked, or boiled. Avoid fried foods. Focus on lean protein sources like fish or chicken with vegetables and fruits. The American Heart Association is a Microbiologist!  American Heart Association Diet and Lifeystyle Recommendations  ? ?Exercise recommendations: ?The American Heart Association recommends 150 minutes of moderate intensity exercise weekly. ?Try 30 minutes of moderate intensity exercise 4-5 times per week. ?This could include walking, jogging, or swimming. ? ? ?Important Information About Sugar ? ? ? ? ? ? ?

## 2021-05-30 NOTE — Progress Notes (Signed)
? ?Office Visit  ?  ?Patient Name: Tricia [name] ?Date of Encounter: 05/30/2021 ? ?PCP:  Orma Render, NP ?  ?[place]  ?Cardiologist:  Sherren Mocha, MD  ?Advanced Practice Provider:  Liliane Shi, PA-C ?Electrophysiologist:  None  ?   ? ?Chief Complaint  ?  ?Tricia [name] is a 76 y.o. female with a hx of CAD (s/p BMS to LAD, s/p inferior STEMI 8/17 with DES to RCA s/p staged PCI with DES to LAD due to ISR), hypertension, diabetes, hyperlipidemia, COPD, obesity, PVC, venous insufficiency  presents today for follow-up of coronary disease ? ?Past Medical History  ?  ?Past Medical History:  ?Diagnosis Date  ? Adenomatous polyp of colon 02/2004  ? Allergy 2000  ? Sulfur  ? Anemia   ? Anxiety   ? Arthritis   ? B12 deficiency   ? CAD (coronary artery disease)   ? a. s/p remote BMS to LAD;  b. 09/2015 Inf STEMI/VF Arrest: LM nl, LAD 85p (staged PCI 2 days later w/ 3.0x32 Synergy DES), 40p/m, 25d, LCX 50m RCA 80ost/100p (2.75x32 Synergy DES), 666mEF 55-65%. // c. Myoview 2/18: EF 59, no ischemia or infarction; Normal study // Myoview 3/22: EF 70, no ischemia or infarction; low risk  ? Chronic kidney disease, stage 3, mod decreased GFR (HCC) 03/09/2020  ? COPD (chronic obstructive pulmonary disease) ([facility]2018  ? Chest X-ray  ? Dermatophytosis of groin and perianal area   ? Diet Controlled Diabetes Mellitus   ? Diverticulosis   ? GERD (gastroesophageal reflux disease)   ? H/O echocardiogram   ? a. 09/2015 Echo: EF 60-65%, no rwma, mild AI, mildly dil RA, mild to mod TR.  ? History of echocardiogram   ? Echo 3/22: EF 60-65, no RWMA, GR 1 DD, normal RVSF, mild LAE, RVSP 35.5, mild to moderate TR, trivial AI  ? Hyperlipidemia   ? Hypertension   ? Hypertensive heart disease   ? Low back pain   ? l5 disc  ? Lumbar spinal stenosis 03/03/2019  ? Morbid obesity ([facility]  ? [facility]  ? Neuromuscular disorder ([facility]  ? Osteoporosis   ? PVC's (premature ventricular  contractions) 04/16/2017  ? Holter 3/19: NSR, average heart rate 69, frequent PVCs (5% total beats), rare supraventricular ectopics, no AF/flutter  ? Sleep apnea 10/03/2009  ? Resolved after gastric bypass    ? TOBACCO ABUSE 10/05/2009  ? Qualifier: Diagnosis of  By: CrStanford BreedMD, FAKandyce Rud ? Ventricular fibrillation (HCNiceville08/28/2017  ? a. In setting of inferior STEMI.  ? ?Past Surgical History:  ?Procedure Laterality Date  ? ABDOMINAL HYSTERECTOMY    ? APPENDECTOMY    ? BARIATRIC SURGERY    ? CARDIAC CATHETERIZATION N/A 10/01/2015  ? Procedure: Left Heart Cath and Coronary Angiography;  Surgeon: DaLeonie ManMD;  [facility];  Service: Cardiovascular;  Laterality: N/A;  ? CARDIAC CATHETERIZATION N/A 10/01/2015  ? Procedure: Coronary Stent Intervention;  Surgeon: DaLeonie ManMD;  [facility];  Service: Cardiovascular;  Laterality: N/A;  ? CARDIAC CATHETERIZATION N/A 10/03/2015  ? Procedure: Coronary Stent Intervention;  Surgeon: ThTroy SineMD;  [facility];  Service: Cardiovascular;  Laterality: N/A;  ? CARDIAC CATHETERIZATION N/A 10/03/2015  ? Procedure: Coronary/Graft Angiography;  Surgeon: ThTroy SineMD;  [facility];  Service: Cardiovascular;  Laterality: N/A;  ?  CARPAL TUNNEL RELEASE Bilateral   ? CERVICAL DISC SURGERY    ? CHOLECYSTECTOMY    ? CORONARY ANGIOPLASTY  2002  ? EYE SURGERY  2020 January  ? Cataracts  ? HERNIA REPAIR    ? JOINT REPLACEMENT  2003  ? Knee  ? LUMBAR LAMINECTOMY    ? L3-4  ? NEPHRECTOMY Right   ? Partial  ? SPINE SURGERY    ? C1  ? TONSILLECTOMY    ? TOTAL KNEE ARTHROPLASTY Left   ? TUBAL LIGATION    ? ULNAR NERVE REPAIR Left   ? ? ?Allergies ? ?Allergies  ?Allergen Reactions  ? Sulfa Antibiotics Diarrhea and Nausea And Vomiting  ? ? ?History of Present Illness  ?  ?Tricia [name] is a 76 y.o. female with a hx of CAD (s/p BMS to LAD, s/p inferior STEMI 8/17 with DES to RCA s/p staged PCI  with DES to LAD due to ISR), hypertension, diabetes, hyperlipidemia, COPD, obesity, PVC, venous insufficiency last seen 04/11/20 by Richardson Dopp, PA. ? ?She was last seen 04/11/2020 by Richardson Dopp, PA for preoperative clearance for EGD, colonoscopy.  She noted worsening shortness of breath and work-up was recommended. Myoview 04/16/21 with no ischemia nor infarction. Echo 04/19/21 normal LVEF 60-65%, no RWMA, gr1DD, RV mildly enlarged, normal RVSF/PASP, LA mildly dilated, mild-moderate TR, trivial AI.  ? ?The other day - woke up from sleeping. Change of weather causes some shortness of breath which is relieved with her inhaler.  ? ?Denies orthopnea, PND.  ?Vitamin water, water ? ?EKGs/Labs/Other Studies Reviewed:  ? ?The following studies were reviewed today: ? ?Echo 3/17/233 ? 1. Left ventricular ejection fraction, by estimation, is 60 to 65%. The  ?left ventricle has normal function. The left ventricle has no regional  ?wall motion abnormalities. Left ventricular diastolic parameters are  ?consistent with Grade I diastolic  ?dysfunction (impaired relaxation). Elevated left atrial pressure.  ? 2. Right ventricular systolic function is normal. The right ventricular  ?size is mildly enlarged. There is normal pulmonary artery systolic  ?pressure.  ? 3. Left atrial size was mildly dilated.  ? 4. The mitral valve is normal in structure. No evidence of mitral valve  ?regurgitation.  ? 5. Tricuspid valve regurgitation is mild to moderate.  ? 6. The aortic valve is tricuspid. Aortic valve regurgitation is trivial.  ? ?Myoview 04/16/20 ?The left ventricular ejection fraction is hyperdynamic (>65%). ?Nuclear stress EF: 70%. There are no wall motion abnormalities ?There was no ST segment deviation noted during stress. ?The study is normal. ?This is a low risk study. There is no evidence of ischemia or infarction. ?  ?AAA [place] 04/30/17 ?IMPRESSION: ?Negative for abdominal aortic aneurysm. ?  ?24 Hr Holter 04/08/17 ?The basic rhythm is  normal sinus with an average heart rate of 69 bpm ?There are no bradyarrhythmias ?There are frequent PVC's approximately 5% of total beats ?There are rare supraventricular ectopics ?No atrial fibrillation or flutter ?  ?Nuclear stress test 03/25/16 ?EF 59, no ischemia or scar, normal study ?  ?LHC 10/03/15 ?LAD prox and mid stent 85% ISR, dist stent ok with 25% ISR ?LCx prox 40%, dist 60% ?RCA ostial stent ok, prox stent ok, mid 65% and 20% ?PCI Angiosculpt scoring balloon and 3 x 32 mm Synergy DES to prox and mid LAD ?  ?Echo 10/02/15 ?EF 60-65%, normal wall motion, mild AI, mild RAE, mild to moderate TR ?  ?LHC 10/01/15 ?RCA ostial 80% and proximal 100%, mid 65% ?LCx prox  40% ?LAD prox stent 40% ISR, 85% ISR, dist stent ok with 25% ISR ?EF 55-65% ?PCI: 2.75 x 32 mm Synergy DES to ost/prox RCA ? ?EKG:  EKG is ordered today.  The ekg ordered today demonstrates NSR 71 bpm with nonspecific T wave changes stable compared to previous. ? ?Recent Labs: ?12/11/2020: TSH 3.230 ?03/13/2021: ALT 12; BUN 43; Creatinine, Ser 1.33; Hemoglobin 13.1; Platelets 252; Potassium 4.6; Sodium 142  ?Recent Lipid Panel ?   ?Component Value Date/Time  ? CHOL 138 12/11/2020 1553  ? TRIG 144 12/11/2020 1553  ? TRIG 122 12/11/2005 1028  ? HDL 43 12/11/2020 1553  ? CHOLHDL 3.2 12/11/2020 1553  ? CHOLHDL 3 09/12/2020 1117  ? VLDL 22.6 09/12/2020 1117  ? [place] 70 12/11/2020 1553  ? LDLCALC 105 (H) 09/06/2019 1058  ? LDLDIRECT 86.0 09/11/2015 1104  ? ? ? ?Home Medications  ? ?Current Meds  ?Medication Sig  ? aspirin EC 81 MG tablet Take 81 mg by mouth daily.  ? Cholecalciferol (VITAMIN D) 50 MCG (2000 UT) tablet Take 1 tablet by mouth daily.  ? Continuous Blood Gluc Sensor (FREESTYLE LIBRE 3 SENSOR) MISC 1 Units by Does not apply route every 14 (fourteen) days. Place 1 sensor on the skin every 14 days. Use to check glucose continuously  ? Continuous Blood Gluc Sensor (FREESTYLE LIBRE 3 SENSOR) MISC 1 Units by Does not apply route every 14 (fourteen)  days.  ? dapagliflozin propanediol (FARXIGA) 10 MG TABS tablet Take 10 mg by mouth daily.  ? ezetimibe-simvastatin (VYTORIN) 10-80 MG tablet Take 1 tablet by mouth at bedtime.  ? furosemide (LASIX) 20 MG tablet

## 2021-06-10 ENCOUNTER — Ambulatory Visit (INDEPENDENT_AMBULATORY_CARE_PROVIDER_SITE_OTHER): Payer: PPO | Admitting: Nurse Practitioner

## 2021-06-10 ENCOUNTER — Encounter (HOSPITAL_BASED_OUTPATIENT_CLINIC_OR_DEPARTMENT_OTHER): Payer: Self-pay | Admitting: Nurse Practitioner

## 2021-06-10 VITALS — BP 117/72 | HR 86 | Ht 62.0 in | Wt 216.8 lb

## 2021-06-10 DIAGNOSIS — M5441 Lumbago with sciatica, right side: Secondary | ICD-10-CM

## 2021-06-10 DIAGNOSIS — R600 Localized edema: Secondary | ICD-10-CM | POA: Diagnosis not present

## 2021-06-10 DIAGNOSIS — E1121 Type 2 diabetes mellitus with diabetic nephropathy: Secondary | ICD-10-CM | POA: Diagnosis not present

## 2021-06-10 DIAGNOSIS — N2581 Secondary hyperparathyroidism of renal origin: Secondary | ICD-10-CM | POA: Diagnosis not present

## 2021-06-10 DIAGNOSIS — E1159 Type 2 diabetes mellitus with other circulatory complications: Secondary | ICD-10-CM

## 2021-06-10 DIAGNOSIS — E785 Hyperlipidemia, unspecified: Secondary | ICD-10-CM

## 2021-06-10 DIAGNOSIS — J449 Chronic obstructive pulmonary disease, unspecified: Secondary | ICD-10-CM

## 2021-06-10 DIAGNOSIS — G8929 Other chronic pain: Secondary | ICD-10-CM

## 2021-06-10 DIAGNOSIS — N1831 Chronic kidney disease, stage 3a: Secondary | ICD-10-CM

## 2021-06-10 DIAGNOSIS — Z9884 Bariatric surgery status: Secondary | ICD-10-CM

## 2021-06-10 DIAGNOSIS — D518 Other vitamin B12 deficiency anemias: Secondary | ICD-10-CM | POA: Diagnosis not present

## 2021-06-10 DIAGNOSIS — E1169 Type 2 diabetes mellitus with other specified complication: Secondary | ICD-10-CM

## 2021-06-10 DIAGNOSIS — R0602 Shortness of breath: Secondary | ICD-10-CM | POA: Insufficient documentation

## 2021-06-10 DIAGNOSIS — I152 Hypertension secondary to endocrine disorders: Secondary | ICD-10-CM | POA: Diagnosis not present

## 2021-06-10 MED ORDER — VALSARTAN 320 MG PO TABS: 320.0000 mg | ORAL_TABLET | Freq: Every day | ORAL | 3 refills | Status: DC

## 2021-06-10 MED ORDER — FREESTYLE LITE TEST VI STRP: ORAL_STRIP | 11 refills | Status: DC

## 2021-06-10 MED ORDER — DAPAGLIFLOZIN PROPANEDIOL 10 MG PO TABS: 10.0000 mg | ORAL_TABLET | Freq: Every day | ORAL | 3 refills | Status: DC

## 2021-06-10 MED ORDER — GABAPENTIN 300 MG PO CAPS: 300.0000 mg | ORAL_CAPSULE | Freq: Three times a day (TID) | ORAL | 3 refills | Status: DC

## 2021-06-10 MED ORDER — FUROSEMIDE 20 MG PO TABS: 20.0000 mg | ORAL_TABLET | ORAL | 3 refills | Status: DC | PRN

## 2021-06-10 MED ORDER — VITAMIN D 50 MCG (2000 UT) PO TABS: 2000.0000 [IU] | ORAL_TABLET | Freq: Every day | ORAL | 3 refills | Status: DC

## 2021-06-10 NOTE — Assessment & Plan Note (Signed)
No alarm symptoms present at this time.  BMI 39.65.  We will monitor for electrolyte imbalance. ?

## 2021-06-10 NOTE — Progress Notes (Signed)
?[name], DNP, AGNP-c ?[facility] ?[address] ?[address] ?[place],  [address] ?[phone] Office [phone] Fax ? ?ESTABLISHED PATIENT- Chronic Health and/or Follow-Up Visit ? ?Blood pressure 117/72, pulse 86, height '5\' 2"'$  (1.575 m), weight 216 lb 12.8 oz (98.3 kg), SpO2 97 %. ? ?Diabetes, Hypertension, COPD, Chronic Kidney Disease, and Hyperlipidemia ? ? ?HPI ? ?Tricia [name]  is a 76 y.o. year old female presenting today for evaluation and management of the following: ?Hypertension associated with diabetes ([place]) ?Not cheking BP at home ?Saw cardiology last week ?Feeling very good ?No high blood pressure readings in a long time ?No symptoms of CP, palpitations, dizziness ?Has had some ShOB this week ? ?Chronic obstructive pulmonary disease, unspecified COPD type ([place]) ?Has felt a little more short of breath lately ?Feels like it could be related to the changes in the weather ?No wheezing present ?Did have to use rescue inhaler last week ? ?Hyperlipidemia associated with type 2 diabetes mellitus ([place]) ?Taking medication as prescribed ?No SE ? ?Hyperparathyroidism, secondary ([place]) ?No symptoms ?Has had some cold interolace and feeling cold ? ?Type 2 diabetes with nephropathy ([place]) ?Sugars have been running higher in the mornings when awakening ?Using freestlye libre for monitoring.  ?Taking [place] ?Some "electric shocks" in the legs related to neuropathy- sometimes cant feel legs at all, but thinks this is from the back ? ?Stage 3a chronic kidney disease ([place]) ?No urinary change  ?No back pain ?Seeing nephrology  ?Last UTI in December ? ?Anemia, B12 deficiency ?Cold easily ?Reports "normal" fatigue, but nothing out of the ordinary ? ?Hx of gastric bypass ?No sx present today ? ?ROS ?All ROS negative with exception of what is listed in HPI ? ?PHYSICAL EXAM ?Physical Exam ? ?ASSESSMENT & PLAN ?Problem List Items Addressed This Visit   ? ? Hyperlipidemia  associated with type 2 diabetes mellitus ([place])  ? Relevant Medications  ? dapagliflozin propanediol (FARXIGA) 10 MG TABS tablet  ? Other Relevant Orders  ? CBC with Differential/Platelet  ? Comprehensive metabolic panel  ? Lipid panel  ? Hemoglobin A1c  ? TSH  ? VITAMIN D 25 Hydroxy (Vit-D Deficiency, Fractures)  ? T4, free  ? B12 and Folate Panel  ? Iron, TIBC and Ferritin Panel  ? Hypertension associated with diabetes ([place]) - Primary  ? Relevant Medications  ? dapagliflozin propanediol (FARXIGA) 10 MG TABS tablet  ? Other Relevant Orders  ? CBC with Differential/Platelet  ? Comprehensive metabolic panel  ? Lipid panel  ? Hemoglobin A1c  ? TSH  ? VITAMIN D 25 Hydroxy (Vit-D Deficiency, Fractures)  ? T4, free  ? B12 and Folate Panel  ? Iron, TIBC and Ferritin Panel  ? B Nat Peptide  ? Chronic kidney disease, stage 3, mod decreased GFR (HCC)  ? Relevant Medications  ? dapagliflozin propanediol (FARXIGA) 10 MG TABS tablet  ? Other Relevant Orders  ? CBC with Differential/Platelet  ? Comprehensive metabolic panel  ? Lipid panel  ? Hemoglobin A1c  ? TSH  ? VITAMIN D 25 Hydroxy (Vit-D Deficiency, Fractures)  ? T4, free  ? B12 and Folate Panel  ? Iron, TIBC and Ferritin Panel  ? Type 2 diabetes with nephropathy ([place])  ? Relevant Medications  ? dapagliflozin propanediol (FARXIGA) 10 MG TABS tablet  ? Other Relevant Orders  ? CBC with Differential/Platelet  ? Comprehensive metabolic panel  ? Lipid panel  ? Hemoglobin A1c  ? TSH  ? VITAMIN D 25 Hydroxy (Vit-D  Deficiency, Fractures)  ? T4, free  ? B12 and Folate Panel  ? Iron, TIBC and Ferritin Panel  ? Hx of gastric bypass  ? Relevant Orders  ? CBC with Differential/Platelet  ? Comprehensive metabolic panel  ? Lipid panel  ? Hemoglobin A1c  ? TSH  ? VITAMIN D 25 Hydroxy (Vit-D Deficiency, Fractures)  ? T4, free  ? B12 and Folate Panel  ? Iron, TIBC and Ferritin Panel  ? COPD (chronic obstructive pulmonary disease) ([place])  ? Relevant Orders  ? CBC with Differential/Platelet  ?  Comprehensive metabolic panel  ? Lipid panel  ? Hemoglobin A1c  ? TSH  ? VITAMIN D 25 Hydroxy (Vit-D Deficiency, Fractures)  ? T4, free  ? B12 and Folate Panel  ? Iron, TIBC and Ferritin Panel  ? Hyperparathyroidism, secondary ([place])  ? Relevant Orders  ? CBC with Differential/Platelet  ? Comprehensive metabolic panel  ? Lipid panel  ? Hemoglobin A1c  ? TSH  ? VITAMIN D 25 Hydroxy (Vit-D Deficiency, Fractures)  ? T4, free  ? B12 and Folate Panel  ? Iron, TIBC and Ferritin Panel  ? Anemia, B12 deficiency  ? Relevant Orders  ? CBC with Differential/Platelet  ? Comprehensive metabolic panel  ? Lipid panel  ? Hemoglobin A1c  ? TSH  ? VITAMIN D 25 Hydroxy (Vit-D Deficiency, Fractures)  ? T4, free  ? B12 and Folate Panel  ? Iron, TIBC and Ferritin Panel  ? ?Other Visit Diagnoses   ? ? Shortness of breath      ? Relevant Orders  ? B Nat Peptide  ? ?  ? ?Trelegy samples x2 today ? ?FOLLOW-UP ?Return in about 6 months (around 12/11/2021) for chronic conditions. ? ?Time: 44 minutes, >50% spent counseling, care coordination, chart review, and documentation.  ? ?[name], DNP, AGNP-c ?

## 2021-06-10 NOTE — Assessment & Plan Note (Addendum)
We will monitor labs today.  Patient has experienced some recent increased sensitivity to cold.  Paresthesias are also present. ?

## 2021-06-10 NOTE — Assessment & Plan Note (Signed)
Chronic.  Currently on statin therapy.  Managing diet.  We will obtain labs today for further evaluation and make changes to plan of care as necessary based on findings. ?

## 2021-06-10 NOTE — Assessment & Plan Note (Signed)
Chronic.  No alarm symptoms present at this time.  We will monitor labs today. ?

## 2021-06-10 NOTE — Patient Instructions (Addendum)
It was a pleasure seeing you today. I hope your time spent with [place] was pleasant and helpful. Please let us know if there is anything we can do to improve the service you receive.  ? ?Today we discussed concerns with: ? ?Hypertension associated with diabetes ([place]) ? ?Chronic obstructive pulmonary disease, unspecified COPD type ([place]) ? ?Hyperlipidemia associated with type 2 diabetes mellitus ([place]) ? ?Hyperparathyroidism, secondary ([place]) ? ?Type 2 diabetes with nephropathy ([place]) ? ?Stage 3a chronic kidney disease ([place]) ? ?Anemia, B12 deficiency ? ?Hx of gastric bypass ? ? ?We will get some labs to make sure everything is looking OK.  ?I think the shortness of breath is from allergies causing your COPD to get a little worse right now. We will check labs to make sure this isn't the heart.  ?Let's try the sample inhaler and if that helps then we can try that as needed.  ? ?The following orders have been placed for you today: ? ?No orders of the defined types were placed in this encounter. ? ? ? ?Important Office Information ?Lab Results ?If labs were ordered, please note that you will see results through [place] as soon as they come available from [place].  ?It takes up to 5 business days for the results to be routed to me and for me to review them once all of the lab results have come through from [facility]. I will make recommendations based on your results and send these through [place] or someone from the office will call you to discuss. If your labs are abnormal, we may contact you to schedule a visit to discuss the results and make recommendations.  ?If you have not heard from [place] within 5 business days or you have waited longer than a week and your lab results have not come through on [place], please feel free to call the office or send a message through [place] to follow-up on these labs.  ? ?Referrals ?If referrals were placed today, the office where the referral was sent will contact you either by phone or through  [place] to set up scheduling. Please note that it can take up to a week for the referral office to contact you. If you do not hear from them in a week, please contact the referral office directly to inquire about scheduling.  ? ?Condition Treated ?If your condition worsens or you begin to have new symptoms, please schedule a follow-up appointment for further evaluation. If you are not sure if an appointment is needed, you may call the office to leave a message for the nurse and someone will contact you with recommendations.  ?If you have an urgent or life threatening emergency, please do not call the office, but seek emergency evaluation by calling 911 or going to the nearest emergency room for evaluation.  ? ?MyChart and Phone Calls ?Please do not use MyChart for urgent messages. It may take up to 3 business days for MyChart messages to be read by staff and if they are unable to handle the request, an additional 3 business days for them to be routed to me and for my response.  ?Messages sent to the provider through [place] do not come directly to the provider, please allow time for these messages to be routed and for me to respond.  ?We get a large volume of MyChart messages daily and these are responded to in the order received.  ? ?For urgent messages, please call the office at [phone] and speak with the front  office staff or leave a message on the line of my assistant for guidance.  ?We are seeing patients from the hours of 8:00 am through 5:00 pm and calls directly to the nurse may not be answered immediately due to seeing patients, but your call will be returned as soon as possible.  ?Phone  messages received after 4:00 PM Monday through Thursday may not be returned until the following business day. Phone messages received after 11:00 AM on Friday may not be returned until Monday.  ? ?After Hours ?We share on call hours with providers from other offices. If you have an urgent need after hours that cannot  wait until the next business day, please contact the on call provider by calling the office number. A nurse will speak with you and contact the provider if needed for recommendations.  ?If you have an urgent or life threatening emergency after hours, please do not call the on call provider, but seek emergency evaluation by calling 911 or going to the nearest emergency room for evaluation.  ? ?Paperwork ?All paperwork requires a minimum of 5 days to complete and return to you or the designated personnel. Please keep this in mind when bringing in forms or sending requests for paperwork completion to the office.  ?  ?

## 2021-06-10 NOTE — Assessment & Plan Note (Signed)
Chronic.  Recent a.m. fasting labs have been elevated.  Linked freestyle CGM today so that I can review her readings and determine if there are patterns present.  At this time we will continue.  She is having some neuropathic pain in her legs.  We will send refills on gabapentin for her today.  Labs today. ?

## 2021-06-10 NOTE — Assessment & Plan Note (Signed)
Chronic.  In the setting of diabetes and cardiovascular disease tight control is recommended.  Blood pressures are within normal limits.  We will monitor A1c today.  She is currently on Farxiga for protection.  We will continue to monitor closely. ?

## 2021-06-10 NOTE — Assessment & Plan Note (Signed)
Chronic.  Well-controlled at this time.  No alarm symptoms present today. ?Refills sent today for 12 months. ?We will obtain labs today for further evaluation and monitor for any changes needed.  We will plan to follow-up in 3 months or sooner if needed. ?

## 2021-06-10 NOTE — Assessment & Plan Note (Signed)
Chronic.  Experiencing some shortness of breath.  Lungs appear slightly congested on auscultation today.  Suspect that she could be having exacerbation of her COPD related to recent environmental changes.  Patient provided with sample of Trelegy Ellipta for 1 month to see if this improves her symptoms.  If this is effective we may need to work on helping her continue with this medication as cost has been restrictive in the past for daily medications.  We will continue to follow. ?

## 2021-06-11 LAB — CBC WITH DIFFERENTIAL/PLATELET
Basophils Absolute: 0 10*3/uL (ref 0.0–0.2)
Basos: 0 %
EOS (ABSOLUTE): 0.3 10*3/uL (ref 0.0–0.4)
Eos: 2 %
Hematocrit: 42.2 % (ref 34.0–46.6)
Hemoglobin: 13.5 g/dL (ref 11.1–15.9)
Immature Grans (Abs): 0 10*3/uL (ref 0.0–0.1)
Immature Granulocytes: 0 %
Lymphocytes Absolute: 1.8 10*3/uL (ref 0.7–3.1)
Lymphs: 17 %
MCH: 25.7 pg — ABNORMAL LOW (ref 26.6–33.0)
MCHC: 32 g/dL (ref 31.5–35.7)
MCV: 80 fL (ref 79–97)
Monocytes Absolute: 0.6 10*3/uL (ref 0.1–0.9)
Monocytes: 6 %
Neutrophils Absolute: 7.8 10*3/uL — ABNORMAL HIGH (ref 1.4–7.0)
Neutrophils: 75 %
Platelets: 284 10*3/uL (ref 150–450)
RBC: 5.25 x10E6/uL (ref 3.77–5.28)
RDW: 13.5 % (ref 11.7–15.4)
WBC: 10.5 10*3/uL (ref 3.4–10.8)

## 2021-06-11 LAB — T4, FREE: Free T4: 1.51 ng/dL (ref 0.82–1.77)

## 2021-06-11 LAB — TSH: TSH: 3.47 u[IU]/mL (ref 0.450–4.500)

## 2021-06-11 LAB — HEMOGLOBIN A1C
Est. average glucose Bld gHb Est-mCnc: 143 mg/dL
Hgb A1c MFr Bld: 6.6 % — ABNORMAL HIGH (ref 4.8–5.6)

## 2021-06-11 LAB — LIPID PANEL
Chol/HDL Ratio: 3 ratio (ref 0.0–4.4)
Cholesterol, Total: 131 mg/dL (ref 100–199)
HDL: 44 mg/dL (ref >39)
LDL Chol Calc (NIH): 60 mg/dL (ref 0–99)
Triglycerides: 158 mg/dL — ABNORMAL HIGH (ref 0–149)
VLDL Cholesterol Cal: 27 mg/dL (ref 5–40)

## 2021-06-11 LAB — COMPREHENSIVE METABOLIC PANEL
ALT: 12 IU/L (ref 0–32)
AST: 18 IU/L (ref 0–40)
Albumin/Globulin Ratio: 1.5 (ref 1.2–2.2)
Albumin: 4.1 g/dL (ref 3.7–4.7)
Alkaline Phosphatase: 186 IU/L — ABNORMAL HIGH (ref 44–121)
BUN/Creatinine Ratio: 22 (ref 12–28)
BUN: 22 mg/dL (ref 8–27)
Bilirubin Total: 0.4 mg/dL (ref 0.0–1.2)
CO2: 20 mmol/L (ref 20–29)
Calcium: 9.6 mg/dL (ref 8.7–10.3)
Chloride: 105 mmol/L (ref 96–106)
Creatinine, Ser: 0.98 mg/dL (ref 0.57–1.00)
Globulin, Total: 2.8 g/dL (ref 1.5–4.5)
Glucose: 142 mg/dL — ABNORMAL HIGH (ref 70–99)
Potassium: 3.8 mmol/L (ref 3.5–5.2)
Sodium: 143 mmol/L (ref 134–144)
Total Protein: 6.9 g/dL (ref 6.0–8.5)
eGFR: 61 mL/min/{1.73_m2} (ref >59)

## 2021-06-11 LAB — IRON,TIBC AND FERRITIN PANEL
Ferritin: 20 ng/mL (ref 15–150)
Iron Saturation: 16 % (ref 15–55)
Iron: 57 ug/dL (ref 27–139)
Total Iron Binding Capacity: 349 ug/dL (ref 250–450)
UIBC: 292 ug/dL (ref 118–369)

## 2021-06-11 LAB — B12 AND FOLATE PANEL
Folate: 9.2 ng/mL (ref >3.0)
Vitamin B-12: 534 pg/mL (ref 232–1245)

## 2021-06-11 LAB — VITAMIN D 25 HYDROXY (VIT D DEFICIENCY, FRACTURES): Vit D, 25-Hydroxy: 44.1 ng/mL (ref 30.0–100.0)

## 2021-07-03 ENCOUNTER — Telehealth (HOSPITAL_BASED_OUTPATIENT_CLINIC_OR_DEPARTMENT_OTHER): Payer: Self-pay

## 2021-07-03 DIAGNOSIS — M159 Polyosteoarthritis, unspecified: Secondary | ICD-10-CM

## 2021-07-04 MED ORDER — HYDROCODONE-ACETAMINOPHEN 10-325 MG PO TABS: 1.0000 | ORAL_TABLET | Freq: Every day | ORAL | 0 refills | Status: DC | PRN

## 2021-07-04 NOTE — Telephone Encounter (Signed)
Refill sent for #30 Norco 10-'325mg'$ .

## 2021-07-17 ENCOUNTER — Encounter (HOSPITAL_BASED_OUTPATIENT_CLINIC_OR_DEPARTMENT_OTHER): Payer: Self-pay | Admitting: Nurse Practitioner

## 2021-07-17 ENCOUNTER — Other Ambulatory Visit (HOSPITAL_BASED_OUTPATIENT_CLINIC_OR_DEPARTMENT_OTHER): Payer: Self-pay

## 2021-07-17 DIAGNOSIS — E1121 Type 2 diabetes mellitus with diabetic nephropathy: Secondary | ICD-10-CM

## 2021-07-17 DIAGNOSIS — E1169 Type 2 diabetes mellitus with other specified complication: Secondary | ICD-10-CM

## 2021-07-17 DIAGNOSIS — N1831 Chronic kidney disease, stage 3a: Secondary | ICD-10-CM

## 2021-07-23 DIAGNOSIS — D1801 Hemangioma of skin and subcutaneous tissue: Secondary | ICD-10-CM | POA: Diagnosis not present

## 2021-07-23 DIAGNOSIS — D224 Melanocytic nevi of scalp and neck: Secondary | ICD-10-CM | POA: Diagnosis not present

## 2021-07-23 DIAGNOSIS — D485 Neoplasm of uncertain behavior of skin: Secondary | ICD-10-CM | POA: Diagnosis not present

## 2021-07-23 DIAGNOSIS — L578 Other skin changes due to chronic exposure to nonionizing radiation: Secondary | ICD-10-CM | POA: Diagnosis not present

## 2021-07-23 DIAGNOSIS — L821 Other seborrheic keratosis: Secondary | ICD-10-CM | POA: Diagnosis not present

## 2021-07-23 DIAGNOSIS — D229 Melanocytic nevi, unspecified: Secondary | ICD-10-CM | POA: Diagnosis not present

## 2021-07-23 DIAGNOSIS — L814 Other melanin hyperpigmentation: Secondary | ICD-10-CM | POA: Diagnosis not present

## 2021-07-23 NOTE — Telephone Encounter (Signed)
Paper form processed. Fax successful.

## 2021-08-12 ENCOUNTER — Encounter (HOSPITAL_BASED_OUTPATIENT_CLINIC_OR_DEPARTMENT_OTHER): Payer: Self-pay | Admitting: Nurse Practitioner

## 2021-08-12 DIAGNOSIS — Z13820 Encounter for screening for osteoporosis: Secondary | ICD-10-CM

## 2021-09-16 ENCOUNTER — Ambulatory Visit (HOSPITAL_BASED_OUTPATIENT_CLINIC_OR_DEPARTMENT_OTHER)
Admission: RE | Admit: 2021-09-16 | Discharge: 2021-09-16 | Disposition: A | Discharge status: Home / Self Care | Payer: PPO | Source: Ambulatory Visit | Attending: Nurse Practitioner | Admitting: Nurse Practitioner

## 2021-09-16 ENCOUNTER — Ambulatory Visit: Payer: PPO

## 2021-09-16 ENCOUNTER — Ambulatory Visit: Payer: HMO

## 2021-09-16 DIAGNOSIS — E559 Vitamin D deficiency, unspecified: Secondary | ICD-10-CM | POA: Insufficient documentation

## 2021-09-16 DIAGNOSIS — N189 Chronic kidney disease, unspecified: Secondary | ICD-10-CM | POA: Insufficient documentation

## 2021-09-16 DIAGNOSIS — M810 Age-related osteoporosis without current pathological fracture: Secondary | ICD-10-CM | POA: Insufficient documentation

## 2021-09-16 DIAGNOSIS — Z780 Asymptomatic menopausal state: Secondary | ICD-10-CM | POA: Diagnosis not present

## 2021-09-16 DIAGNOSIS — Z13820 Encounter for screening for osteoporosis: Secondary | ICD-10-CM | POA: Diagnosis not present

## 2021-09-16 DIAGNOSIS — E1122 Type 2 diabetes mellitus with diabetic chronic kidney disease: Secondary | ICD-10-CM | POA: Insufficient documentation

## 2021-09-16 DIAGNOSIS — M8589 Other specified disorders of bone density and structure, multiple sites: Secondary | ICD-10-CM | POA: Diagnosis not present

## 2021-09-17 DIAGNOSIS — E119 Type 2 diabetes mellitus without complications: Secondary | ICD-10-CM | POA: Diagnosis not present

## 2021-09-20 ENCOUNTER — Encounter (HOSPITAL_BASED_OUTPATIENT_CLINIC_OR_DEPARTMENT_OTHER): Payer: Self-pay | Admitting: Nurse Practitioner

## 2021-10-17 ENCOUNTER — Encounter (HOSPITAL_BASED_OUTPATIENT_CLINIC_OR_DEPARTMENT_OTHER): Payer: Self-pay | Admitting: Nurse Practitioner

## 2021-12-11 ENCOUNTER — Encounter (HOSPITAL_BASED_OUTPATIENT_CLINIC_OR_DEPARTMENT_OTHER): Payer: Self-pay | Admitting: Nurse Practitioner

## 2021-12-11 ENCOUNTER — Ambulatory Visit (INDEPENDENT_AMBULATORY_CARE_PROVIDER_SITE_OTHER): Payer: HMO | Admitting: Nurse Practitioner

## 2021-12-11 VITALS — BP 126/60 | HR 71 | Ht 61.0 in | Wt 223.0 lb

## 2021-12-11 DIAGNOSIS — E1159 Type 2 diabetes mellitus with other circulatory complications: Secondary | ICD-10-CM

## 2021-12-11 DIAGNOSIS — I2510 Atherosclerotic heart disease of native coronary artery without angina pectoris: Secondary | ICD-10-CM

## 2021-12-11 DIAGNOSIS — J449 Chronic obstructive pulmonary disease, unspecified: Secondary | ICD-10-CM | POA: Diagnosis not present

## 2021-12-11 DIAGNOSIS — J441 Chronic obstructive pulmonary disease with (acute) exacerbation: Secondary | ICD-10-CM | POA: Diagnosis not present

## 2021-12-11 DIAGNOSIS — E875 Hyperkalemia: Secondary | ICD-10-CM

## 2021-12-11 DIAGNOSIS — E785 Hyperlipidemia, unspecified: Secondary | ICD-10-CM | POA: Diagnosis not present

## 2021-12-11 DIAGNOSIS — N1832 Chronic kidney disease, stage 3b: Secondary | ICD-10-CM | POA: Diagnosis not present

## 2021-12-11 DIAGNOSIS — E1169 Type 2 diabetes mellitus with other specified complication: Secondary | ICD-10-CM | POA: Diagnosis not present

## 2021-12-11 DIAGNOSIS — M150 Primary generalized (osteo)arthritis: Secondary | ICD-10-CM

## 2021-12-11 DIAGNOSIS — E1122 Type 2 diabetes mellitus with diabetic chronic kidney disease: Secondary | ICD-10-CM

## 2021-12-11 DIAGNOSIS — E1121 Type 2 diabetes mellitus with diabetic nephropathy: Secondary | ICD-10-CM

## 2021-12-11 DIAGNOSIS — N2581 Secondary hyperparathyroidism of renal origin: Secondary | ICD-10-CM | POA: Diagnosis not present

## 2021-12-11 DIAGNOSIS — K219 Gastro-esophageal reflux disease without esophagitis: Secondary | ICD-10-CM | POA: Diagnosis not present

## 2021-12-11 DIAGNOSIS — M159 Polyosteoarthritis, unspecified: Secondary | ICD-10-CM | POA: Diagnosis not present

## 2021-12-11 DIAGNOSIS — N1831 Chronic kidney disease, stage 3a: Secondary | ICD-10-CM

## 2021-12-11 DIAGNOSIS — I152 Hypertension secondary to endocrine disorders: Secondary | ICD-10-CM

## 2021-12-11 MED ORDER — PANTOPRAZOLE SODIUM 40 MG PO TBEC: 40.0000 mg | DELAYED_RELEASE_TABLET | Freq: Two times a day (BID) | ORAL | 11 refills | Status: DC

## 2021-12-11 MED ORDER — EZETIMIBE-SIMVASTATIN 10-80 MG PO TABS: 1.0000 | ORAL_TABLET | Freq: Every day | ORAL | 3 refills | Status: DC

## 2021-12-11 MED ORDER — AZITHROMYCIN 250 MG PO TABS: ORAL_TABLET | ORAL | 0 refills | Status: AC

## 2021-12-11 MED ORDER — NITROGLYCERIN 0.4 MG/SPRAY TL SOLN: 1.0000 | 3 refills | Status: DC | PRN

## 2021-12-11 MED ORDER — PREDNISONE 20 MG PO TABS: 40.0000 mg | ORAL_TABLET | Freq: Every day | ORAL | 0 refills | Status: DC

## 2021-12-11 MED ORDER — HYDROCODONE-ACETAMINOPHEN 10-325 MG PO TABS: 1.0000 | ORAL_TABLET | Freq: Every day | ORAL | 0 refills | Status: DC | PRN

## 2021-12-11 NOTE — Progress Notes (Signed)
Tricia Keeler, DNP, AGNP-c [place], [place] [address] [phone] Office [phone] Fax  ESTABLISHED PATIENT- Chronic Health and/or Follow-Up Visit  Blood pressure 126/60, pulse 71, height _0  (1.549 m), weight 223 lb (101.2 kg), SpO2 97 %.    Tricia [name] is a 76 y.o. year old female presenting today for evaluation and management of the following: Follow-up (Pt presents today for 42M follow up. Pt feels that she is doing.)  Hydrocodone She tells me that she is taking this for her back as needed. Her last prescription was sent 6 months ago from a different provider. She tells me she tries not to take this often.   Hypertension/Hyperlipidemia, follow-up  BP Readings from Last 3 Encounters:  12/11/21 126/60  06/10/21 117/72  05/30/21 104/60   Wt Readings from Last 3 Encounters:  12/11/21 223 lb (101.2 kg)  06/10/21 216 lb 12.8 oz (98.3 kg)  05/30/21 218 lb 12.8 oz (99.2 kg)     She was last seen for hypertension 6 months ago.  BP at that visit was controlled. Management since that visit includes no changes.  She reports excellent compliance with treatment. She is not having side effects.  She is following a Low Sodium, Diabetic diet for the most part. She is not exercising. She does not smoke.  Use of agents associated with hypertension: none.   Outside blood pressures are not checking. Symptoms: No chest pain No chest pressure  No palpitations No syncope  Yes dyspnea No orthopnea  No paroxysmal nocturnal dyspnea Yes lower extremity edema   Pertinent labs Lab Results  Component Value Date   CHOL 146 12/11/2021   HDL 42 12/11/2021   LDLCALC 80 12/11/2021   LDLDIRECT 86.0 09/11/2015   TRIG 134 12/11/2021   CHOLHDL 3.5 12/11/2021   Lab Results  Component Value Date   NA 139 12/11/2021   K 5.3 (H) 12/11/2021   CREATININE 1.07 (H) 12/11/2021   EGFR 55 (L) 12/11/2021   GLUCOSE 134  (H) 12/11/2021   TSH 3.010 12/11/2021     The ASCVD Risk score (Arnett DK, et al., 2019) failed to calculate for the following reasons:   The patient has a prior MI or stroke diagnosis  --------------------------------------------------------------------------------------------------- COPD, Follow up  She was last seen for this 6 months ago. Changes made include none.   She reports good compliance with treatment. She is not having side effects.  she uses rescue inhaler 1 per months. She IS experiencing cough and wheezing- she currently has a URI She is NOT experiencing fatigue, weight increase, weight loss, chills, fever, increased sputum, or colored sputum. she reports breathing is  worse with the URI, but otherwise stable .  Pulmonary Functions Testing Results:  No results found for: "FEV1", "FVC", "FEV1FVC", "TLC"  ----------------------------------------------------------------------------------------- Diabetes Mellitus Type II, Follow-up: Patient here for follow-up of Type 2 diabetes mellitus.  Current symptoms/problems include none and have been unchanged.  Known diabetic complications: nephropathy, cardiovascular disease, and peripheral vascular disease Cardiovascular risk factors: advanced age (older than 12 for men, 40 for women), diabetes mellitus, dyslipidemia, hypertension, and obesity (BMI >= 30 kg/m2) Current diabetic medications include oral agent (monotherapy): farxiga .   Eye exam current (within one year): yes Weight trend: stable Prior visit with dietician: no Current diet:  "sometimes following low carb" Current exercise: none  Current monitoring regimen:  freestyle CGM Home blood sugar records:  Up and down Any episodes of hypoglycemia? yes -  occasionally will drop to (lowest) 65 in the middle of the night but rebounds quickly.  She tells me her last meal/snack is about 8-9 and she goes to bed about 12-1am. She endorses her last meal/snack is higher  carbohydrate.  She tells me the more she moves the higher her blood sugar goes when she wakes up prior to eating. Typically her fasting blood sugar is 125/130. When she eats she tells me it will go up to about 150 then come back down.   Is She on ACE inhibitor or angiotensin II receptor blocker?  Yes  valsartan ([place]) ________________________________________________________  Hyperparathyroidism: Patient presents with hyperparathyroidism.  The patient has had complaints of asymptomatic elevation of the serum and calcium and parathormone. Onset of symptoms was several years ago, unchanged since that time. She has not had previous history of head or neck radiation. She does not have familial history of endocrine malignancies. ________________________________________________________   All ROS negative with exception of what is listed above.   PHYSICAL EXAM Physical Exam Vitals and nursing note reviewed.  Constitutional:      General: She is not in acute distress.    Appearance: Normal appearance.  HENT:     Head: Normocephalic.  Eyes:     Extraocular Movements: Extraocular movements intact.     Conjunctiva/sclera: Conjunctivae normal.     Pupils: Pupils are equal, round, and reactive to light.  Neck:     Vascular: No carotid bruit.  Cardiovascular:     Rate and Rhythm: Normal rate and regular rhythm.     Pulses: Normal pulses.     Heart sounds: Normal heart sounds. No murmur heard. Pulmonary:     Effort: Pulmonary effort is normal.     Breath sounds: Normal breath sounds. No wheezing.  Abdominal:     General: Bowel sounds are normal. There is no distension.     Palpations: Abdomen is soft.     Tenderness: There is no abdominal tenderness. There is no guarding.  Musculoskeletal:        General: Normal range of motion.     Cervical back: Normal range of motion and neck supple.     Right lower leg: No edema.     Left lower leg: No edema.  Lymphadenopathy:     Cervical: No  cervical adenopathy.  Skin:    General: Skin is warm and dry.     Capillary Refill: Capillary refill takes less than 2 seconds.  Neurological:     General: No focal deficit present.     Mental Status: She is alert and oriented to person, place, and time.  Psychiatric:        Mood and Affect: Mood normal.        Behavior: Behavior normal.        Thought Content: Thought content normal.        Judgment: Judgment normal.     PLAN Problem List Items Addressed This Visit     Hyperlipidemia associated with type 2 diabetes mellitus ([place])    Chronic.  Statin therapy and ezetimibe in place.  Diet and exercise recommendations discussed.  Labs pending.      Relevant Medications   ezetimibe-simvastatin (VYTORIN) 10-80 MG tablet   nitroGLYCERIN (NITROLINGUAL) 0.4 MG/SPRAY spray   Other Relevant Orders   CBC with Differential/Platelet (Completed)   Comprehensive metabolic panel (Completed)   Hemoglobin A1c (Completed)   Lipid panel (Completed)   T4, free (Completed)   TSH (Completed)   VITAMIN D 25 Hydroxy (  Vit-D Deficiency, Fractures) (Completed)   HM DIABETES FOOT EXAM (Completed)   Hypertension associated with diabetes (HCC)    Chronic.  Blood pressure currently well-controlled at this time.  Diet and exercise recommendations reviewed.  Encouraged to continue close monitoring of blood sugar and lipid control.      Relevant Medications   ezetimibe-simvastatin (VYTORIN) 10-80 MG tablet   nitroGLYCERIN (NITROLINGUAL) 0.4 MG/SPRAY spray   Other Relevant Orders   CBC with Differential/Platelet (Completed)   Comprehensive metabolic panel (Completed)   Hemoglobin A1c (Completed)   Lipid panel (Completed)   T4, free (Completed)   TSH (Completed)   VITAMIN D 25 Hydroxy (Vit-D Deficiency, Fractures) (Completed)   HM DIABETES FOOT EXAM (Completed)   GERD (gastroesophageal reflux disease), Rx Protonix    Chronic.  On daily Protonix.  No alarm symptoms present.  Labs pending.       Relevant Medications   pantoprazole (PROTONIX) 40 MG tablet   Coronary artery disease involving native coronary artery of native heart without angina pectoris    Chronic.  Blood pressure well-controlled today.  No alarm symptoms present at this time.  Labs pending.  Diet and exercise recommendations reviewed.      Relevant Medications   ezetimibe-simvastatin (VYTORIN) 10-80 MG tablet   nitroGLYCERIN (NITROLINGUAL) 0.4 MG/SPRAY spray   COPD (chronic obstructive pulmonary disease) (HCC)    Chronic.  Former smoker.  No alarm symptoms present at this time.  Currently well-managed with ProAir.  No maintenance inhaler at this time.  May benefit from samples if available during exacerbation.      Relevant Medications   predniSONE (DELTASONE) 20 MG tablet   Other Relevant Orders   CBC with Differential/Platelet (Completed)   Comprehensive metabolic panel (Completed)   Hemoglobin A1c (Completed)   Lipid panel (Completed)   T4, free (Completed)   TSH (Completed)   VITAMIN D 25 Hydroxy (Vit-D Deficiency, Fractures) (Completed)   HM DIABETES FOOT EXAM (Completed)   Hyperparathyroidism, secondary (HCC)    Chronic.  No alarm symptoms present at this time.  Labs pending.  No changes to plan of care at this time.      Relevant Orders   CBC with Differential/Platelet (Completed)   Comprehensive metabolic panel (Completed)   Hemoglobin A1c (Completed)   Lipid panel (Completed)   T4, free (Completed)   TSH (Completed)   VITAMIN D 25 Hydroxy (Vit-D Deficiency, Fractures) (Completed)   HM DIABETES FOOT EXAM (Completed)   Chronic kidney disease, stage 3, mod decreased GFR (HCC)    Chronic.  Currently on Farxiga.  No alarm symptoms present at this time.  Labs pending.      Relevant Orders   CBC with Differential/Platelet (Completed)   Comprehensive metabolic panel (Completed)   Hemoglobin A1c (Completed)   Lipid panel (Completed)   T4, free (Completed)   TSH (Completed)   VITAMIN D 25  Hydroxy (Vit-D Deficiency, Fractures) (Completed)   HM DIABETES FOOT EXAM (Completed)   DM (diabetes mellitus), type 2 with renal complications ([place]) - Primary    Chronic.  Currently managed with [place], diet, and exercise.  No alarm symptoms present at this time.  Recommend continued close blood pressure control for overall cardiovascular health and reduction of risk factors.  Labs pending.      Relevant Orders   CBC with Differential/Platelet (Completed)   Comprehensive metabolic panel (Completed)   Hemoglobin A1c (Completed)   Lipid panel (Completed)   T4, free (Completed)   TSH (Completed)   VITAMIN D 25  Hydroxy (Vit-D Deficiency, Fractures) (Completed)   HM DIABETES FOOT EXAM (Completed)   Osteoarthritis, multiple sites   Relevant Medications   HYDROcodone-acetaminophen (NORCO) 10-325 MG tablet   predniSONE (DELTASONE) 20 MG tablet   Other Visit Diagnoses     COPD with acute exacerbation ([place])       Relevant Medications   predniSONE (DELTASONE) 20 MG tablet   Hyperkalemia           Return in about 6 months (around 06/11/2022) for Chronic Mngment- will stay here.   Tricia Keeler, DNP, AGNP-c

## 2021-12-11 NOTE — Patient Instructions (Addendum)
It was good to see you today. You can schedule with Dr. Burnard Bunting for your 6 month follow-up.   I will be in touch with you once we get the labs back and let you know if we need to make any changes.   To help keep your blood sugars stable, it is best to eat several small meals/snacks through the day and include a carbohydrate and a protein in every meal or snack to help with immediate sugar availability in your blood and then the long term availability in your blood. Protein takes longer to break down for fuel so this helps prevent the fast rise and fall of the blood sugar.   I am going to send in a treatment of azithromycin for your lungs. I will also send in prednisone for 4 days to help with the inflammation in the lungs.   If you are still feeling bad on Monday please let me know.

## 2021-12-12 LAB — COMPREHENSIVE METABOLIC PANEL
ALT: 11 IU/L (ref 0–32)
AST: 18 IU/L (ref 0–40)
Albumin/Globulin Ratio: 1.7 (ref 1.2–2.2)
Albumin: 4.1 g/dL (ref 3.8–4.8)
Alkaline Phosphatase: 187 IU/L — ABNORMAL HIGH (ref 44–121)
BUN/Creatinine Ratio: 20 (ref 12–28)
BUN: 21 mg/dL (ref 8–27)
Bilirubin Total: 0.3 mg/dL (ref 0.0–1.2)
CO2: 25 mmol/L (ref 20–29)
Calcium: 10 mg/dL (ref 8.7–10.3)
Chloride: 102 mmol/L (ref 96–106)
Creatinine, Ser: 1.07 mg/dL — ABNORMAL HIGH (ref 0.57–1.00)
Globulin, Total: 2.4 g/dL (ref 1.5–4.5)
Glucose: 134 mg/dL — ABNORMAL HIGH (ref 70–99)
Potassium: 5.3 mmol/L — ABNORMAL HIGH (ref 3.5–5.2)
Sodium: 139 mmol/L (ref 134–144)
Total Protein: 6.5 g/dL (ref 6.0–8.5)
eGFR: 55 mL/min/{1.73_m2} — ABNORMAL LOW (ref >59)

## 2021-12-12 LAB — CBC WITH DIFFERENTIAL/PLATELET
Basophils Absolute: 0.1 10*3/uL (ref 0.0–0.2)
Basos: 1 %
EOS (ABSOLUTE): 0.4 10*3/uL (ref 0.0–0.4)
Eos: 4 %
Hematocrit: 40.6 % (ref 34.0–46.6)
Hemoglobin: 12.4 g/dL (ref 11.1–15.9)
Immature Grans (Abs): 0 10*3/uL (ref 0.0–0.1)
Immature Granulocytes: 0 %
Lymphocytes Absolute: 2.1 10*3/uL (ref 0.7–3.1)
Lymphs: 26 %
MCH: 24.8 pg — ABNORMAL LOW (ref 26.6–33.0)
MCHC: 30.5 g/dL — ABNORMAL LOW (ref 31.5–35.7)
MCV: 81 fL (ref 79–97)
Monocytes Absolute: 0.6 10*3/uL (ref 0.1–0.9)
Monocytes: 8 %
Neutrophils Absolute: 5 10*3/uL (ref 1.4–7.0)
Neutrophils: 61 %
Platelets: 254 10*3/uL (ref 150–450)
RBC: 4.99 x10E6/uL (ref 3.77–5.28)
RDW: 13.9 % (ref 11.7–15.4)
WBC: 8.2 10*3/uL (ref 3.4–10.8)

## 2021-12-12 LAB — T4, FREE: Free T4: 1.31 ng/dL (ref 0.82–1.77)

## 2021-12-12 LAB — HEMOGLOBIN A1C
Est. average glucose Bld gHb Est-mCnc: 157 mg/dL
Hgb A1c MFr Bld: 7.1 % — ABNORMAL HIGH (ref 4.8–5.6)

## 2021-12-12 LAB — TSH: TSH: 3.01 u[IU]/mL (ref 0.450–4.500)

## 2021-12-12 LAB — LIPID PANEL
Chol/HDL Ratio: 3.5 ratio (ref 0.0–4.4)
Cholesterol, Total: 146 mg/dL (ref 100–199)
HDL: 42 mg/dL (ref >39)
LDL Chol Calc (NIH): 80 mg/dL (ref 0–99)
Triglycerides: 134 mg/dL (ref 0–149)
VLDL Cholesterol Cal: 24 mg/dL (ref 5–40)

## 2021-12-12 LAB — VITAMIN D 25 HYDROXY (VIT D DEFICIENCY, FRACTURES): Vit D, 25-Hydroxy: 49.1 ng/mL (ref 30.0–100.0)

## 2022-01-10 ENCOUNTER — Ambulatory Visit (INDEPENDENT_AMBULATORY_CARE_PROVIDER_SITE_OTHER): Payer: PPO

## 2022-01-10 ENCOUNTER — Encounter (HOSPITAL_BASED_OUTPATIENT_CLINIC_OR_DEPARTMENT_OTHER): Payer: Self-pay

## 2022-01-10 DIAGNOSIS — Z0000 Encounter for general adult medical examination without abnormal findings: Secondary | ICD-10-CM | POA: Diagnosis not present

## 2022-01-10 NOTE — Patient Instructions (Signed)

## 2022-01-10 NOTE — Progress Notes (Signed)
Subjective:   Tricia [name] is a 76 y.o. female who presents for Medicare Annual (Subsequent) preventive examination.  I connected with  Tricia [name] on 01/10/22 by a audio enabled telemedicine application and verified that I am speaking with the correct person using two identifiers.  Patient Location: Home  Provider Location: Home Office  I discussed the limitations of evaluation and management by telemedicine. The patient expressed understanding and agreed to proceed.        Objective:    There were no vitals filed for this visit. There is no height or weight on file to calculate BMI.     09/08/2020    7:22 PM 09/06/2020    8:13 AM 09/01/2019    8:06 AM 09/02/2018    6:52 AM 07/27/2018    2:48 PM 09/07/2017    9:37 AM 07/17/2017    8:12 AM  Advanced Directives  Does Patient Have a Medical Advance Directive? Yes Yes Yes Yes No No;Yes No  Type of Advance Directive Living will Living will [place];Living will [place];Living will  Living will   Does patient want to make changes to medical advance directive?    Yes (MAU/Ambulatory/Procedural Areas - Information given)  No - Patient declined   Copy of [place] in Chart?   No - copy requested No - copy requested     Would patient like information on creating a medical advance directive?      No - Patient declined     Current Medications (verified) Outpatient Encounter Medications as of 01/10/2022  Medication Sig   amLODipine (NORVASC) 10 MG tablet Take 1 tablet (10 mg total) by mouth daily.   aspirin EC 81 MG tablet Take 81 mg by mouth daily.   carvedilol (COREG) 6.25 MG tablet Take 1 tablet (6.25 mg total) by mouth 2 (two) times daily with a meal.   Cholecalciferol (VITAMIN D) 50 MCG (2000 UT) tablet Take 1 tablet (2,000 Units total) by mouth daily.   clopidogrel (PLAVIX) 75 MG tablet Take 1 tablet (75 mg total) by mouth daily.   Continuous Blood Gluc Sensor (FREESTYLE  LIBRE 3 SENSOR) MISC 1 Units by Does not apply route every 14 (fourteen) days.   dapagliflozin propanediol (FARXIGA) 10 MG TABS tablet Take 1 tablet (10 mg total) by mouth daily.   ezetimibe-simvastatin (VYTORIN) 10-80 MG tablet Take 1 tablet by mouth at bedtime.   furosemide (LASIX) 20 MG tablet Take 1-2 tablets (20-40 mg total) by mouth as needed for fluid or edema.   glucose blood (FREESTYLE LITE) test strip TEST UP TO FOUR TIMES DAILY Dx: E11.22   HYDROcodone-acetaminophen (NORCO) 10-325 MG tablet Take 1 tablet by mouth daily as needed.   Lancets (FREESTYLE) lancets TEST UP TO FOUR TIMES DAILY Dx: E11.22   nitroGLYCERIN (NITROLINGUAL) 0.4 MG/SPRAY spray Place 1 spray under the tongue every 5 (five) minutes x 3 doses as needed for chest pain.   pantoprazole (PROTONIX) 40 MG tablet Take 1 tablet (40 mg total) by mouth 2 (two) times daily before a meal.   potassium chloride (KLOR-CON) 10 MEQ tablet TAKE ONE TABLET BY MOUTH WHEN YOU TAKE YOUR FUROSEMIDE FOR SWELLING.   PROAIR HFA 108 (90 Base) MCG/ACT inhaler Inhale 2 puffs into the lungs every 6 (six) hours as needed for wheezing or shortness of breath.   valsartan (DIOVAN) 320 MG tablet Take 1 tablet (320 mg total) by mouth daily.   gabapentin (NEURONTIN) 300 MG  capsule Take 1 capsule (300 mg total) by mouth 3 (three) times daily. (Patient not taking: Reported on 01/10/2022)   Lidocaine (HM LIDOCAINE PATCH) 4 % PTCH Apply 1 patch topically every 12 (twelve) hours as needed (for scapular pain). (Patient not taking: Reported on 01/10/2022)   predniSONE (DELTASONE) 20 MG tablet Take 2 tablets (40 mg total) by mouth daily with breakfast. (Patient not taking: Reported on 01/10/2022)   No facility-administered encounter medications on file as of 01/10/2022.    Allergies (verified) Sulfa antibiotics   History: Past Medical History:  Diagnosis Date   Adenomatous polyp of colon 02/2004   Allergy 2000   Sulfur   Anemia    Anxiety    Arthritis     B12 deficiency    CAD (coronary artery disease)    a. s/p remote BMS to LAD;  b. 09/2015 Inf STEMI/VF Arrest: LM nl, LAD 85p (staged PCI 2 days later w/ 3.0x32 Synergy DES), 40p/m, 25d, LCX 39m RCA 80ost/100p (2.75x32 Synergy DES), 663mEF 55-65%. // c. Myoview 2/18: EF 59, no ischemia or infarction; Normal study // Myoview 3/22: EF 70, no ischemia or infarction; low risk   Chronic kidney disease, stage 3, mod decreased GFR (HCC) 03/09/2020   COPD (chronic obstructive pulmonary disease) ([facility]   Chest X-ray   Dermatophytosis of groin and perianal area    Diet Controlled Diabetes Mellitus    Diverticulosis    GERD (gastroesophageal reflux disease)    H/O echocardiogram    a. 09/2015 Echo: EF 60-65%, no rwma, mild AI, mildly dil RA, mild to mod TR.   History of echocardiogram    Echo 3/22: EF 60-65, no RWMA, GR 1 DD, normal RVSF, mild LAE, RVSP 35.5, mild to moderate TR, trivial AI   Hyperlipidemia    Hypertension    Hypertensive heart disease    Low back pain    l5 disc   Lumbar spinal stenosis 03/03/2019   Morbid obesity ([facility]   Myocardial infarction ([facility]2001, 2017   Neuromuscular disorder ([facility]   Osteoporosis    PVC's (premature ventricular contractions) 04/16/2017   Holter 3/19: NSR, average heart rate 69, frequent PVCs (5% total beats), rare supraventricular ectopics, no AF/flutter   Sleep apnea 10/03/2009   Resolved after gastric bypass     TOBACCO ABUSE 10/05/2009   Qualifier: Diagnosis of  By: CrStanford BreedMD, FAKandyce Rud  Ventricular fibrillation ([facility]08/28/2017   a. In setting of inferior STEMI.   Past Surgical History:  Procedure Laterality Date   ABDOMINAL HYSTERECTOMY     APPENDECTOMY     BARIATRIC SURGERY     CARDIAC CATHETERIZATION N/A 10/01/2015   Procedure: Left Heart Cath and Coronary Angiography;  Surgeon: DaLeonie ManMD;  [facility];  Service: Cardiovascular;  Laterality: N/A;   CARDIAC CATHETERIZATION N/A 10/01/2015    Procedure: Coronary Stent Intervention;  Surgeon: DaLeonie ManMD;  [facility];  Service: Cardiovascular;  Laterality: N/A;   CARDIAC CATHETERIZATION N/A 10/03/2015   Procedure: Coronary Stent Intervention;  Surgeon: ThTroy SineMD;  [facility];  Service: Cardiovascular;  Laterality: N/A;   CARDIAC CATHETERIZATION N/A 10/03/2015   Procedure: Coronary/Graft Angiography;  Surgeon: ThTroy SineMD;  Location: [facility];  Service: Cardiovascular;  Laterality: N/A;   CARPAL TUNNEL RELEASE Bilateral    CERVICAL DISC SURGERY     CHOLECYSTECTOMY     CORONARY ANGIOPLASTY  2002   EYE SURGERY  2020 January   Cataracts   HERNIA REPAIR     JOINT REPLACEMENT  2003   Knee   LUMBAR LAMINECTOMY     L3-4   NEPHRECTOMY Right    Partial   SPINE SURGERY     C1   TONSILLECTOMY     TOTAL KNEE ARTHROPLASTY Left    TUBAL LIGATION     ULNAR NERVE REPAIR Left    Family History  Problem Relation Age of Onset   Colitis Mother        sepsis from c dif colitis   Cancer Mother    COPD Mother    Diabetes Mother    Heart disease Mother    Hyperlipidemia Mother    Hypertension Mother    Heart disease Father    Early death Father    Lung cancer Maternal Uncle    Cancer Maternal Uncle    Stomach cancer Maternal Aunt    Lung cancer Maternal Uncle    Cancer Maternal Grandmother    Hearing loss Paternal Grandfather    Hyperlipidemia Paternal Grandfather    Hypertension Paternal Grandfather    Stroke Paternal Grandfather    Cancer Paternal Grandmother    Cancer Maternal Aunt    Cancer Maternal Uncle    Cancer Brother    Diabetes Sister    Early death Sister    Learning disabilities Sister    Learning disabilities Daughter    Vision loss Maternal Aunt    Colon cancer Neg Hx    Esophageal cancer Neg Hx    Rectal cancer Neg Hx    Social History   Socioeconomic History   Marital status: Widowed    Spouse name: Not on file   Number of  children: Not on file   Years of education: Not on file   Highest education level: Not on file  Occupational History   Occupation: retired    Fish farm manager: UNEMPLOYED  Tobacco Use   Smoking status: Former    Packs/day: 2.00    Years: 41.00    Total pack years: 82.00    Types: Cigarettes    Quit date: 08/04/2015    Years since quitting: 6.4   Smokeless tobacco: Never   Tobacco comments:    Quit 2000 started again 2008, quit again 2011, started again 2014  Vaping Use   Vaping Use: Never used  Substance and Sexual Activity   Alcohol use: No   Drug use: No   Sexual activity: Not Currently    Birth control/protection: Post-menopausal  Other Topics Concern   Not on file  Social History Narrative   Widowed in 04/2012. 2 children. 3 grandkids. Lives alone. Completely indendent. Disabled/retired. Disabled-lifted computer paper. Hobbies: spend money-gamble.   Social Determinants of Health   Financial Resource Strain: Low Risk  (09/06/2020)   Overall Financial Resource Strain (CARDIA)    Difficulty of Paying Living Expenses: Not hard at all  Food Insecurity: No Food Insecurity (01/10/2022)   Hunger Vital Sign    Worried About Running Out of Food in the Last Year: Never true    Ran Out of Food in the Last Year: Never true  Transportation Needs: No Transportation Needs (01/10/2022)   PRAPARE - Hydrologist (Medical): No    Lack of Transportation (Non-Medical): No  Physical Activity: Sufficiently Active (09/06/2020)   Exercise Vital Sign    Days of Exercise per Week: 4 days    Minutes of Exercise  per Session: 40 min  Stress: No Stress Concern Present (09/06/2020)   [place]    Feeling of Stress : Not at all  Social Connections: Socially Isolated (01/10/2022)   Social Connection and Isolation Panel [NHANES]    Frequency of Communication with Friends and Family: More than three times a week     Frequency of Social Gatherings with Friends and Family: More than three times a week    Attends Religious Services: Never    Marine scientist or Organizations: No    Attends Archivist Meetings: Never    Marital Status: Widowed    Tobacco Counseling Counseling given: Not Answered Tobacco comments: Quit 2000 started again 2008, quit again 2011, started again 2014   Clinical Intake:  Pre-visit preparation completed: Yes  Pain : No/denies pain     Diabetes: Yes CBG done?: No Did pt. bring in CBG monitor from home?: No  How often do you need to have someone help you when you read instructions, pamphlets, or other written materials from your doctor or pharmacy?: 1 - Never What is the last grade level you completed in school?: associates  Diabetic?yes  Interpreter Needed?: No      Activities of Daily Living    01/10/2022   11:29 AM 01/06/2022    8:56 AM  In your present state of health, do you have any difficulty performing the following activities:  Hearing? 0 0  Vision? 0 0  Difficulty concentrating or making decisions? 0 0  Walking or climbing stairs? 1 1  Dressing or bathing? 0 0  Doing errands, shopping? 0 0  Preparing Food and eating ?  N  Using the Toilet?  N  In the past six months, have you accidently leaked urine?  Y  Do you have problems with loss of bowel control?  N  Managing your Medications?  N  Managing your Finances?  N  Housekeeping or managing your Housekeeping?  N    Patient Care Team: [place], Blondell Reveal, MD as PCP - General (Family Medicine) Sherren Mocha, MD as PCP - Cardiology (Cardiology) Ladene Artist, MD as Consulting Physician (Gastroenterology) Sharmon Revere as Physician Assistant (Cardiology) Gardiner Barefoot, DPM as Consulting Physician (Podiatry) Sherren Mocha, MD as Consulting Physician (Cardiology) Jovita Gamma, MD as Consulting Physician (Neurosurgery) Madelin Rear, [facility] (Inactive) as Pharmacist  (Pharmacist)  Indicate any recent Medical Services you may have received from other than [facility] providers in the past year (date may be approximate).     Assessment:   This is a routine wellness examination for Tricia [name].  Hearing/Vision screen No results found.  Dietary issues and exercise activities discussed:     Goals Addressed   None   Depression Screen    12/11/2021    8:22 AM 06/10/2021    8:39 AM 12/11/2020    4:07 PM 09/12/2020   10:50 AM 09/06/2020    8:12 AM 03/09/2020    9:08 AM 09/06/2019   10:24 AM  PHQ 2/9 Scores  PHQ - 2 Score 0 0 0 0 0 0 0  PHQ- 9 Score 1  0   0   Exception Documentation  Medical reason         Fall Risk    01/10/2022   11:28 AM 01/06/2022    8:56 AM 12/11/2021    8:22 AM 12/11/2021    8:19 AM 06/10/2021    8:35 AM  Fall Risk  Falls in the past year? 0 0 0 0 0  Number falls in past yr: 0  0 0 0  Injury with Fall? 0  0 0 0  Risk for fall due to : No Fall Risks  No Fall Risks No Fall Risks No Fall Risks  Follow up Falls evaluation completed  Falls evaluation completed;Education provided Falls evaluation completed;Education provided Education provided;Falls evaluation completed    FALL RISK PREVENTION PERTAINING TO THE HOME:  Any stairs in or around the home? Yes  If so, are there any without handrails? Yes  Home free of loose throw rugs in walkways, pet beds, electrical cords, etc? Yes  Adequate lighting in your home to reduce risk of falls? Yes   ASSISTIVE DEVICES UTILIZED TO PREVENT FALLS:  Life alert? No  Use of a cane, walker or w/c? Yes  Grab bars in the bathroom? Yes  Shower chair or bench in shower? Yes  Elevated toilet seat or a handicapped toilet? Yes    Cognitive Function:        01/10/2022   11:29 AM 09/06/2020    8:17 AM 09/01/2019    8:11 AM  6CIT Screen  What Year? 0 points 0 points 0 points  What month? 0 points 0 points 0 points  What time? 0 points 0 points 0 points  Count back from 20 0 points 0 points 0 points   Months in reverse 0 points 0 points 0 points  Repeat phrase 2 points 0 points 0 points  Total Score 2 points 0 points 0 points    Immunizations Immunization History  Administered Date(s) Administered   Fluad Quad(high Dose 65+) 10/07/2018, 10/18/2019   Influenza Split 01/21/2011   Influenza Whole 11/19/2007   Influenza, High Dose Seasonal PF 10/16/2015, 11/12/2016, 11/07/2017, 10/20/2020   Influenza-Unspecified 12/18/2013   PFIZER Comirnaty(Gray Top)Covid-19 Tri-Sucrose Vaccine 05/31/2020   PFIZER(Purple Top)SARS-COV-2 Vaccination 03/17/2019, 04/11/2019, 11/03/2019   Pfizer Covid-19 Vaccine Bivalent Booster 23yr & up 10/20/2020   Pneumococcal Conjugate-13 08/26/2013   Pneumococcal Polysaccharide-23 02/04/2003, 06/08/2015   Td 02/03/2005   Zoster Recombinat (Shingrix) 02/18/2017, 07/19/2017    TDAP status: Due, Education has been provided regarding the importance of this vaccine. Advised may receive this vaccine at local pharmacy or Health Dept. Aware to provide a copy of the vaccination record if obtained from local pharmacy or Health Dept. Verbalized acceptance and understanding.  Flu Vaccine status: Completed at today's visit  Pneumococcal vaccine status: Up to date  Covid-19 vaccine status: Completed vaccines  Qualifies for Shingles Vaccine? Yes   Zostavax completed Yes   Shingrix Completed?: Yes  Screening Tests Health Maintenance  Topic Date Due   DTaP/Tdap/Td (2 - Tdap) 02/04/2015   COVID-19 Vaccine (6 - 2023-24 season) 10/04/2021   Diabetic kidney evaluation - Urine ACR  03/13/2022   Lung Cancer Screening  04/27/2022   MAMMOGRAM  04/27/2022   HEMOGLOBIN A1C  06/11/2022   OPHTHALMOLOGY EXAM  09/18/2022   Diabetic kidney evaluation - GFR measurement  12/12/2022   FOOT EXAM  12/12/2022   Medicare Annual Wellness (AWV)  01/11/2023   COLONOSCOPY (Pts 45-443yrInsurance coverage will need to be confirmed)  05/03/2023   DEXA SCAN  09/17/2023   Pneumonia Vaccine 76 Years old  Completed   INFLUENZA VACCINE  Completed   Hepatitis C Screening  Completed   Zoster Vaccines- Shingrix  Completed   HPV VACCINES  Aged Out    Health Maintenance  Health Maintenance Due  Topic Date Due  DTaP/Tdap/Td (2 - Tdap) 02/04/2015   COVID-19 Vaccine (6 - 2023-24 season) 10/04/2021    Colorectal cancer screening: Type of screening: Colonoscopy. Completed 05/03/2023. Repeat every 3 years  Mammogram status: Completed 04/26/2021. Repeat every year  Bone Density status: Completed 09/16/2021. Results reflect: Bone density results: OSTEOPOROSIS. Repeat every 2 years.  Lung Cancer Screening: (Low Dose CT Chest recommended if Age 40-80 years, 30 pack-year currently smoking OR have quit w/in 15years.) does qualify.   Lung Cancer Screening Referral: n/a  Additional Screening:  Hepatitis C Screening: does qualify; Completed 07/07/2015  Vision Screening: Recommended annual ophthalmology exams for early detection of glaucoma and other disorders of the eye. Is the patient up to date with their annual eye exam?  Yes  Who is the provider or what is the name of the office in which the patient attends annual eye exams? [facility]  If pt is not established with a provider, would they like to be referred to a provider to establish care? No .   Dental Screening: Recommended annual dental exams for proper oral hygiene  Community Resource Referral / Chronic Care Management: CRR required this visit?  No   CCM required this visit?  No      Plan:     I have personally reviewed and noted the following in the patient's chart:   Medical and social history Use of alcohol, tobacco or illicit drugs  Current medications and supplements including opioid prescriptions. Patient is currently taking opioid prescriptions. Information provided to patient regarding non-opioid alternatives. Patient advised to discuss non-opioid treatment plan with their provider. Functional ability  and status Nutritional status Physical activity Advanced directives List of other physicians Hospitalizations, surgeries, and ER visits in previous 12 months Vitals Screenings to include cognitive, depression, and falls Referrals and appointments  In addition, I have reviewed and discussed with patient certain preventive protocols, quality metrics, and best practice recommendations. A written personalized care plan for preventive services as well as general preventive health recommendations were provided to patient.     Debbora Dus, [place]   01/10/2022   Nurse Notes: provider notified.

## 2022-01-30 NOTE — Assessment & Plan Note (Signed)
Chronic.  No alarm symptoms present at this time.  Labs pending.  No changes to plan of care at this time.

## 2022-01-30 NOTE — Assessment & Plan Note (Signed)
Chronic.  Currently managed with [place], diet, and exercise.  No alarm symptoms present at this time.  Recommend continued close blood pressure control for overall cardiovascular health and reduction of risk factors.  Labs pending.

## 2022-01-30 NOTE — Assessment & Plan Note (Signed)
Chronic.  Blood pressure well-controlled today.  No alarm symptoms present at this time.  Labs pending.  Diet and exercise recommendations reviewed.

## 2022-01-30 NOTE — Assessment & Plan Note (Signed)
Chronic.  Statin therapy and ezetimibe in place.  Diet and exercise recommendations discussed.  Labs pending.

## 2022-01-30 NOTE — Assessment & Plan Note (Signed)
Chronic.  Currently on Farxiga.  No alarm symptoms present at this time.  Labs pending.

## 2022-01-30 NOTE — Assessment & Plan Note (Signed)
Chronic.  Blood pressure currently well-controlled at this time.  Diet and exercise recommendations reviewed.  Encouraged to continue close monitoring of blood sugar and lipid control.

## 2022-01-30 NOTE — Assessment & Plan Note (Signed)
Chronic.  On daily Protonix.  No alarm symptoms present.  Labs pending.

## 2022-01-30 NOTE — Assessment & Plan Note (Signed)
Chronic.  Former smoker.  No alarm symptoms present at this time.  Currently well-managed with ProAir.  No maintenance inhaler at this time.  May benefit from samples if available during exacerbation.

## 2022-02-05 ENCOUNTER — Other Ambulatory Visit (HOSPITAL_BASED_OUTPATIENT_CLINIC_OR_DEPARTMENT_OTHER): Payer: Self-pay | Admitting: Nurse Practitioner

## 2022-02-05 DIAGNOSIS — I2510 Atherosclerotic heart disease of native coronary artery without angina pectoris: Secondary | ICD-10-CM

## 2022-02-05 NOTE — Telephone Encounter (Signed)
This patient has an appointment scheduled with Dr. Burnard Bunting in May, thanks.

## 2022-02-05 NOTE — Telephone Encounter (Signed)
This patient has an appointment scheduled with Dr. Burnard Bunting in

## 2022-02-18 ENCOUNTER — Encounter (HOSPITAL_BASED_OUTPATIENT_CLINIC_OR_DEPARTMENT_OTHER): Payer: Self-pay | Admitting: Family

## 2022-02-18 ENCOUNTER — Ambulatory Visit (INDEPENDENT_AMBULATORY_CARE_PROVIDER_SITE_OTHER): Payer: PPO | Admitting: Family

## 2022-02-18 VITALS — BP 128/62 | HR 78 | Ht 61.0 in | Wt 232.0 lb

## 2022-02-18 DIAGNOSIS — I10 Essential (primary) hypertension: Secondary | ICD-10-CM | POA: Diagnosis not present

## 2022-02-18 DIAGNOSIS — N1832 Chronic kidney disease, stage 3b: Secondary | ICD-10-CM | POA: Diagnosis not present

## 2022-02-18 DIAGNOSIS — I493 Ventricular premature depolarization: Secondary | ICD-10-CM | POA: Diagnosis not present

## 2022-02-18 DIAGNOSIS — I872 Venous insufficiency (chronic) (peripheral): Secondary | ICD-10-CM | POA: Diagnosis not present

## 2022-02-18 DIAGNOSIS — I25118 Atherosclerotic heart disease of native coronary artery with other forms of angina pectoris: Secondary | ICD-10-CM | POA: Diagnosis not present

## 2022-02-18 DIAGNOSIS — E785 Hyperlipidemia, unspecified: Secondary | ICD-10-CM | POA: Diagnosis not present

## 2022-02-18 NOTE — Progress Notes (Signed)
Office Visit    Patient Name: Tricia [name] Date of Encounter: 02/18/2022  PCP:  [place], Raymond J, [place]  Cardiologist:  Sherren Mocha, MD  Advanced Practice Provider:  Sharmon Revere / Laurann Montana, NP Electrophysiologist:  None      Chief Complaint    Tricia [name] is a 77 y.o. female presents today for fatigue, exertional dyspnea.   Past Medical History    Past Medical History:  Diagnosis Date   Adenomatous polyp of colon 02/2004   Allergy 2000   Sulfur   Anemia    Anxiety    Arthritis    B12 deficiency    CAD (coronary artery disease)    a. s/p remote BMS to LAD;  b. 09/2015 Inf STEMI/VF Arrest: LM nl, LAD 85p (staged PCI 2 days later w/ 3.0x32 Synergy DES), 40p/m, 25d, LCX 60m RCA 80ost/100p (2.75x32 Synergy DES), 643mEF 55-65%. // c. Myoview 2/18: EF 59, no ischemia or infarction; Normal study // Myoview 3/22: EF 70, no ischemia or infarction; low risk   Chronic kidney disease, stage 3, mod decreased GFR (HCC) 03/09/2020   COPD (chronic obstructive pulmonary disease) ([facility]2018   Chest X-ray   Dermatophytosis of groin and perianal area    Diet Controlled Diabetes Mellitus    Diverticulosis    GERD (gastroesophageal reflux disease)    H/O echocardiogram    a. 09/2015 Echo: EF 60-65%, no rwma, mild AI, mildly dil RA, mild to mod TR.   History of echocardiogram    Echo 3/22: EF 60-65, no RWMA, GR 1 DD, normal RVSF, mild LAE, RVSP 35.5, mild to moderate TR, trivial AI   Hyperlipidemia    Hypertension    Hypertensive heart disease    Low back pain    l5 disc   Lumbar spinal stenosis 03/03/2019   Morbid obesity ([place]   Myocardial infarction ([facility]CL[place]2001, 2017   Neuromuscular disorder ([place]   Osteoporosis    PVC's (premature ventricular contractions) 04/16/2017   Holter 3/19: NSR, average heart rate 69, frequent PVCs (5% total beats), rare supraventricular ectopics, no AF/flutter   Sleep apnea 10/03/2009    Resolved after gastric bypass     TOBACCO ABUSE 10/05/2009   Qualifier: Diagnosis of  By: CrStanford BreedMD, FAKandyce Rud  Ventricular fibrillation ([place]08/28/2017   a. In setting of inferior STEMI.   Past Surgical History:  Procedure Laterality Date   ABDOMINAL HYSTERECTOMY     APPENDECTOMY     BARIATRIC SURGERY     CARDIAC CATHETERIZATION N/A 10/01/2015   Procedure: Left Heart Cath and Coronary Angiography;  Surgeon: DaLeonie ManMD;  [facility];  Service: Cardiovascular;  Laterality: N/A;   CARDIAC CATHETERIZATION N/A 10/01/2015   Procedure: Coronary Stent Intervention;  Surgeon: DaLeonie ManMD;  [facility];  Service: Cardiovascular;  Laterality: N/A;   CARDIAC CATHETERIZATION N/A 10/03/2015   Procedure: Coronary Stent Intervention;  Surgeon: ThTroy SineMD;  [facility];  Service: Cardiovascular;  Laterality: N/A;   CARDIAC CATHETERIZATION N/A 10/03/2015   Procedure: Coronary/Graft Angiography;  Surgeon: ThTroy SineMD;  [facility];  Service: Cardiovascular;  Laterality: N/A;   CARPAL TUNNEL RELEASE Bilateral    CERVICAL [place]   CORONARY ANGIOPLASTY  2002   EYE SURGERY  2020 January  Cataracts   HERNIA REPAIR     JOINT REPLACEMENT  2003   Knee   LUMBAR LAMINECTOMY     L3-4   NEPHRECTOMY Right    Partial   SPINE SURGERY     C1   TONSILLECTOMY     TOTAL KNEE ARTHROPLASTY Left    TUBAL LIGATION     ULNAR NERVE REPAIR Left     Allergies  Allergies  Allergen Reactions   Sulfa Antibiotics Diarrhea and Nausea And Vomiting    History of Present Illness    Tricia [name] is a 77 y.o. female with a hx of CAD (s/p BMS to LAD, s/p inferior STEMI 8/17 with DES to RCA s/p staged PCI with DES to LAD due to ISR), hypertension, diabetes, hyperlipidemia, COPD, obesity, PVC, venous insufficiency last seen 37/27/23.   She was seen 04/11/2020 by Richardson Dopp, PA  for preoperative clearance fwith worsening shortness of breath and work-up was recommended. Myoview 04/16/21 with no ischemia nor infarction. Echo 04/19/21 normal LVEF 60-65%, no RWMA, gr1DD, RV mildly enlarged, normal RVSF/PASP, LA mildly dilated, mild-moderate TR, trivial AI.   Last seen 05/30/21 doing well from a cardiac perspective and BP well controlled. No changes were made.   Presents today for follow up. Reports dyspnea on exertion. No shortness of breath at rest. Notes with activity such as doing laundry or walking. No associated chest pain. Her anginal equivalent has been differing previously per her report with both exertional dyspnea or chest pain. Also notes fatigue. She wakes up feeling well and then an hour later feels she is tired. She is sleeping overall well. Gets up 1-3 times to use the restroom which is stable. Reports no snoring. Notes her blood sugar has been elevated 130s fasting and often 170s-200s during the day.    EKGs/Labs/Other Studies Reviewed:   The following studies were reviewed today:  Echo 3/17/233  1. Left ventricular ejection fraction, by estimation, is 60 to 65%. The  left ventricle has normal function. The left ventricle has no regional  wall motion abnormalities. Left ventricular diastolic parameters are  consistent with Grade I diastolic  dysfunction (impaired relaxation). Elevated left atrial pressure.   2. Right ventricular systolic function is normal. The right ventricular  size is mildly enlarged. There is normal pulmonary artery systolic  pressure.   3. Left atrial size was mildly dilated.   4. The mitral valve is normal in structure. No evidence of mitral valve  regurgitation.   5. Tricuspid valve regurgitation is mild to moderate.   6. The aortic valve is tricuspid. Aortic valve regurgitation is trivial.   Myoview 04/16/20 The left ventricular ejection fraction is hyperdynamic (>65%). Nuclear stress EF: 70%. There are no wall motion  abnormalities There was no ST segment deviation noted during stress. The study is normal. This is a low risk study. There is no evidence of ischemia or infarction.   AAA [place] 04/30/17 IMPRESSION: Negative for abdominal aortic aneurysm.   24 Hr Holter 04/08/17 The basic rhythm is normal sinus with an average heart rate of 69 bpm There are no bradyarrhythmias There are frequent PVC's approximately 5% of total beats There are rare supraventricular ectopics No atrial fibrillation or flutter   Nuclear stress test 03/25/16 EF 59, no ischemia or scar, normal study   LHC 10/03/15 LAD prox and mid stent 85% ISR, dist stent ok with 25% ISR LCx prox 40%, dist 60% RCA ostial stent ok, prox stent ok, mid 65% and 20% PCI Angiosculpt  scoring balloon and 3 x 32 mm Synergy DES to prox and mid LAD   Echo 10/02/15 EF 60-65%, normal wall motion, mild AI, mild RAE, mild to moderate TR   LHC 10/01/15 RCA ostial 80% and proximal 100%, mid 65% LCx prox 40% LAD prox stent 40% ISR, 85% ISR, dist stent ok with 25% ISR EF 55-65% PCI: 2.75 x 32 mm Synergy DES to ost/prox RCA  EKG:  EKG is ordered today.  The ekg ordered today demonstrates NSR 71 bpm with nonspecific T wave changes stable compared to previous.  Recent Labs: 12/11/2021: ALT 11; BUN 21; Creatinine, Ser 1.07; Hemoglobin 12.4; Platelets 254; Potassium 5.3; Sodium 139; TSH 3.010  Recent Lipid Panel    Component Value Date/Time   CHOL 146 12/11/2021 1006   TRIG 134 12/11/2021 1006   TRIG 122 12/11/2005 1028   HDL 42 12/11/2021 1006   CHOLHDL 3.5 12/11/2021 1006   CHOLHDL 3 09/12/2020 1117   VLDL 22.6 09/12/2020 1117   LDLCALC 80 12/11/2021 1006   LDLCALC 105 (H) 09/06/2019 1058   LDLDIRECT 86.0 09/11/2015 1104     Home Medications   Current Meds  Medication Sig   amLODipine (NORVASC) 10 MG tablet Take 1 tablet (10 mg total) by mouth daily.   aspirin EC 81 MG tablet Take 81 mg by mouth daily.   carvedilol (COREG) 6.25 MG tablet Take 1  tablet (6.25 mg total) by mouth 2 (two) times daily with a meal.   Cholecalciferol (VITAMIN D) 50 MCG (2000 UT) tablet Take 1 tablet (2,000 Units total) by mouth daily.   clopidogrel (PLAVIX) 75 MG tablet Take 1 tablet (75 mg total) by mouth daily.   Continuous Blood Gluc Sensor (FREESTYLE LIBRE 3 SENSOR) MISC 1 Units by Does not apply route every 14 (fourteen) days.   dapagliflozin propanediol (FARXIGA) 10 MG TABS tablet Take 1 tablet (10 mg total) by mouth daily.   ezetimibe-simvastatin (VYTORIN) 10-80 MG tablet Take 1 tablet by mouth at bedtime.   furosemide (LASIX) 20 MG tablet Take 1-2 tablets (20-40 mg total) by mouth as needed for fluid or edema.   glucose blood (FREESTYLE LITE) test strip TEST UP TO FOUR TIMES DAILY Dx: E11.22   HYDROcodone-acetaminophen (NORCO) 10-325 MG tablet Take 1 tablet by mouth daily as needed.   Lancets (FREESTYLE) lancets TEST UP TO FOUR TIMES DAILY Dx: E11.22   nitroGLYCERIN (NITROLINGUAL) 0.4 MG/SPRAY spray Place 1 spray under the tongue every five minutes for 3 doses as needed for chest pain.   pantoprazole (PROTONIX) 40 MG tablet Take 1 tablet (40 mg total) by mouth 2 (two) times daily before a meal.   potassium chloride (KLOR-CON) 10 MEQ tablet TAKE ONE TABLET BY MOUTH WHEN YOU TAKE YOUR FUROSEMIDE FOR SWELLING.   PROAIR HFA 108 (90 Base) MCG/ACT inhaler Inhale 2 puffs into the lungs every 6 (six) hours as needed for wheezing or shortness of breath.   valsartan (DIOVAN) 320 MG tablet Take 1 tablet (320 mg total) by mouth daily.     Review of Systems      All other systems reviewed and are otherwise negative except as noted above.  Physical Exam    VS:  BP 128/62   Pulse 78   Ht '5\' 1"'$  (1.549 m)   Wt 232 lb (105.2 kg)   LMP  (LMP Unknown)   BMI 43.84 kg/m  , BMI Body mass index is 43.84 kg/m.  Wt Readings from Last 3 Encounters:  02/18/22 232 lb (105.2  kg)  12/11/21 223 lb (101.2 kg)  06/10/21 216 lb 12.8 oz (98.3 kg)     GEN: Well nourished,  overweight, well developed, in no acute distress. HEENT: normal. Neck: Supple, no JVD, carotid bruits, or masses. Cardiac: RRR, no murmurs, rubs, or gallops. No clubbing, cyanosis, edema.  Radials/PT 2+ and equal bilaterally.  Respiratory:  Respirations regular and unlabored, clear to auscultation bilaterally. GI: Soft, nontender, nondistended. MS: No deformity or atrophy. Skin: Warm and dry, no rash. Neuro:  Strength and sensation are intact. Psych: Normal affect.  Assessment & Plan    CAD -Myoview 04/2020 low risk today. GDMT includes aspirin, Plavix, carvedilol, Vytorin. Heart healthy diet and regular cardiovascular exercise encouraged. Notes a few week history of exertional dyspnea, fatigue. Plan for lexiscan myoview to rule out ischemia. If myoview unrevealing, consider echocardiogram. However no edema, orthopnea suggestive of heart failure.   HTN - BP well controlled. Continue current antihypertensive regimen.  Refills provided.  HLD, LDL goal <70 -12/2020 LDL 70.  12/11/21 LDL 80. Referred to PREP exercise program. Continue Vytorin 10-80 mg daily. Plan to repeat lipid panel in 3 months to be coordinated at follow up. If LDL not at goal plan to transition to alternate statin.   DM2 -follows with PCP. May benefit from future addition of GDLP1 for weight loss benefit. Presently on Farxiga '10mg'$  daily. 12/11/21 A1c 7.1. Notes fasting CBG 130s at home and 170-200s later in the day. Encouraged her to reach out to primary care for sooner appointment to discuss.   Morbid obesity -s/p bariatric surgery. Weight loss via diet and exercise encouraged. Discussed the impact being overweight would have on cardiovascular risk. Refer to PREP exercise program.   PVC - Quiescent on Carvedilol. No indication for further workup.  CKDIII - Careful titration of diuretic and antihypertensive.  Follows with nephrology.   LE edema / Venous insufficiency -well-controlled on as needed Lasix.  Disposition: Follow  up in 2 month(s) with Loel Dubonnet, NP   Signed, Loel Dubonnet, NP 02/18/2022, 11:23 AM [place]

## 2022-02-18 NOTE — Patient Instructions (Addendum)
Medication Instructions:  Your Physician recommend you continue on your current medication as directed.    *If you need a refill on your cardiac medications before your next appointment, please call your pharmacy*   Testing/Procedures: Your Physician has requested you have a Myocardial Perfusion Imaging Study.   Please arrive 15 minutes prior to your appointment time for registration and insurance purposes.   The test will take approximately 3 to 4 hours to complete; you may bring reading material. If someone comes with you to your appointment, they will need to remain in the main lobby due to limited testing space in the testing area. **If you are pregnant or breastfeeding, please notify the nuclear lab prior to your appointment**   How to prepare for your test:  - Do not eat or drink 3 hours prior to your test, except you may have water  - Do not consume products containing caffeine ( regular or decaf) 12 hours prior to your test ( coffee, chocolate, sodas or teas)  - Do bring a list of your current medications with you. If not listed below, you may take your medications as normal (Hold beta blocker- 24 hour prior for exercise myoview)   - Do wear comfortable clothes (no dresses or overalls) and walking shoes ( tennis shoes preferred), no heel or open toe shoes are allowed - Do not wear cologne, perfume, aftershave, or lotions ( deodorant is allowed)  - If these instructions are not followed, your test will be rescheduled   If you cannot keep your appointment, please provide 24 hours notice to the Nuclear Lab, to avoid a possible $50 charge to your account!     Follow-Up: At [facility], you and your health needs are our priority.  As part of our continuing mission to provide you with exceptional heart care, we have created designated Provider Care Teams.  These Care Teams include your primary Cardiologist (physician) and Advanced Practice Providers (APPs -  Physician Assistants  and Nurse Practitioners) who all work together to provide you with the care you need, when you need it.  We recommend signing up for the patient portal called "MyChart".  Sign up information is provided on this After Visit Summary.  MyChart is used to connect with patients for Virtual Visits (Telemedicine).  Patients are able to view lab/test results, encounter notes, upcoming appointments, etc.  Non-urgent messages can be sent to your provider as well.   To learn more about what you can do with MyChart, go to [email or web address].    Your next appointment:   2 month(s)  Provider:   Dr. Burt Knack or Laurann Montana, NP     Other Instructions To prevent or reduce lower extremity swelling: Eat a low salt diet. Salt makes the body hold onto extra fluid which causes swelling. Sit with legs elevated. For example, in the recliner or on an [place].  Wear knee-high compression stockings during the daytime. Ones labeled 15-20 mmHg provide good compression.

## 2022-02-21 ENCOUNTER — Telehealth: Payer: Self-pay

## 2022-02-21 NOTE — Telephone Encounter (Signed)
Called to discuss PREP program referral, left voicemail  

## 2022-03-06 ENCOUNTER — Ambulatory Visit (HOSPITAL_BASED_OUTPATIENT_CLINIC_OR_DEPARTMENT_OTHER): Payer: PPO | Admitting: Family

## 2022-03-12 ENCOUNTER — Other Ambulatory Visit (HOSPITAL_BASED_OUTPATIENT_CLINIC_OR_DEPARTMENT_OTHER): Payer: Self-pay | Admitting: Family Medicine

## 2022-03-12 DIAGNOSIS — Z1231 Encounter for screening mammogram for malignant neoplasm of breast: Secondary | ICD-10-CM

## 2022-03-13 ENCOUNTER — Telehealth (HOSPITAL_BASED_OUTPATIENT_CLINIC_OR_DEPARTMENT_OTHER): Payer: Self-pay | Admitting: Family Medicine

## 2022-03-13 NOTE — Telephone Encounter (Signed)
Please reconcile the Chart for the Flu Vaccine     Patient Reported Immunization: Influenza-Unspecified

## 2022-03-17 DIAGNOSIS — I129 Hypertensive chronic kidney disease with stage 1 through stage 4 chronic kidney disease, or unspecified chronic kidney disease: Secondary | ICD-10-CM | POA: Diagnosis not present

## 2022-03-17 DIAGNOSIS — E1121 Type 2 diabetes mellitus with diabetic nephropathy: Secondary | ICD-10-CM | POA: Diagnosis not present

## 2022-03-17 DIAGNOSIS — I2510 Atherosclerotic heart disease of native coronary artery without angina pectoris: Secondary | ICD-10-CM | POA: Diagnosis not present

## 2022-03-17 DIAGNOSIS — N1831 Chronic kidney disease, stage 3a: Secondary | ICD-10-CM | POA: Diagnosis not present

## 2022-03-17 DIAGNOSIS — Z905 Acquired absence of kidney: Secondary | ICD-10-CM | POA: Diagnosis not present

## 2022-03-17 LAB — MICROALBUMIN / CREATININE URINE RATIO: Microalb Creat Ratio: 64

## 2022-03-18 ENCOUNTER — Encounter (HOSPITAL_BASED_OUTPATIENT_CLINIC_OR_DEPARTMENT_OTHER): Payer: Self-pay | Admitting: Family Medicine

## 2022-03-18 ENCOUNTER — Ambulatory Visit (INDEPENDENT_AMBULATORY_CARE_PROVIDER_SITE_OTHER): Payer: PPO | Admitting: Family Medicine

## 2022-03-18 VITALS — BP 141/69 | HR 68 | Ht 61.0 in | Wt 228.0 lb

## 2022-03-18 DIAGNOSIS — E1169 Type 2 diabetes mellitus with other specified complication: Secondary | ICD-10-CM

## 2022-03-18 DIAGNOSIS — E119 Type 2 diabetes mellitus without complications: Secondary | ICD-10-CM

## 2022-03-18 DIAGNOSIS — E785 Hyperlipidemia, unspecified: Secondary | ICD-10-CM

## 2022-03-18 DIAGNOSIS — E1159 Type 2 diabetes mellitus with other circulatory complications: Secondary | ICD-10-CM

## 2022-03-18 DIAGNOSIS — M159 Polyosteoarthritis, unspecified: Secondary | ICD-10-CM | POA: Diagnosis not present

## 2022-03-18 DIAGNOSIS — M48061 Spinal stenosis, lumbar region without neurogenic claudication: Secondary | ICD-10-CM

## 2022-03-18 DIAGNOSIS — I152 Hypertension secondary to endocrine disorders: Secondary | ICD-10-CM | POA: Diagnosis not present

## 2022-03-18 DIAGNOSIS — M150 Primary generalized (osteo)arthritis: Secondary | ICD-10-CM

## 2022-03-18 MED ORDER — HYDROCODONE-ACETAMINOPHEN 10-325 MG PO TABS: 1.0000 | ORAL_TABLET | Freq: Every day | ORAL | 0 refills | Status: DC | PRN

## 2022-03-18 NOTE — Progress Notes (Signed)
Established Patient Office Visit  Subjective   Patient ID: Tricia [name], female    DOB: 1945-10-17  Age: 77 y.o. MRN: [id number]  Chief Complaint  Patient presents with   Follow-up    Pt here for f/u on DM     HPI Follow-up for DM and HTN.    Diabetes: Medication compliance: taking Farxiga 10 mg as prescribed.  Denies chest pain, shortness of breath, vision changes. Endorses polydipsia and  polyuria. Denies hypoglycemia.  Pertinent lab work: A1C: 12/11/21 A1C 7.1  Monitoring: blood sugar readings at home: 120 fasting           Continue current medication regimen: continue Farxiga 10 mg   Well controlled: will check A1C today   Follow-up: 3 months  Hypertension Medication compliance: amlodipine 10 mg, carvedilol 6.25 mg and valsartan 320 mg as prescribed.  Denies chest pain, shortness of breath, lower extremity edema, vision changes, headaches.  Pertinent lab work: 12/11/21, will get today Monitoring at home: 120/83 yesterday at doctor visit,  Tolerating medication well: tolerates well.  Continue current medication regimen: amlodipine 10 mg, carvedilol 6.25 mg, and valsartan 320 mg. Sees cardiology for blood pressure, last seen in January 2024.  Follow-up: 3 months   On hydrocodone 10-325 mg as needed for back pain. PDMP review, no red flags. 12/11/21 # 30 given. Will refill today.   Went to kidney specialist yesterday who suggested patient go on Ozempic.  Will get labs today before changing medications.   Review of Systems  Eyes:  Negative for blurred vision and double vision.  Respiratory:  Negative for shortness of breath.   Cardiovascular:  Negative for chest pain.  Musculoskeletal:  Positive for back pain. Negative for falls.  Neurological:  Negative for dizziness and headaches.  Psychiatric/Behavioral:  Negative for depression.       Objective:     BP (!) 141/69   Pulse 68   Ht 5' 1"$  (1.549 m)   Wt 228 lb (103.4 kg)   LMP  (LMP Unknown)   SpO2 100%    BMI 43.08 kg/m  BP Readings from Last 3 Encounters:  03/18/22 (!) 141/69  02/18/22 128/62  12/11/21 126/60      Physical Exam Vitals and nursing note reviewed.  Constitutional:      General: She is not in acute distress.    Appearance: Normal appearance. She is obese.  Cardiovascular:     Rate and Rhythm: Normal rate and regular rhythm.     Heart sounds: Normal heart sounds.  Pulmonary:     Effort: Pulmonary effort is normal.     Breath sounds: Normal breath sounds.  Skin:    General: Skin is warm and dry.  Neurological:     General: No focal deficit present.     Mental Status: She is alert. Mental status is at baseline.  Psychiatric:        Mood and Affect: Mood normal.        Behavior: Behavior normal.        Thought Content: Thought content normal.        Judgment: Judgment normal.     No results found for any visits on 03/18/22.  Last metabolic panel Lab Results  Component Value Date   GLUCOSE 134 (H) 12/11/2021   NA 139 12/11/2021   K 5.3 (H) 12/11/2021   CL 102 12/11/2021   CO2 25 12/11/2021   BUN 21 12/11/2021   CREATININE 1.07 (H) 12/11/2021   EGFR 55 (L)  12/11/2021   CALCIUM 10.0 12/11/2021   PHOS 4.4 06/07/2020   PROT 6.5 12/11/2021   ALBUMIN 4.1 12/11/2021   LABGLOB 2.4 12/11/2021   AGRATIO 1.7 12/11/2021   BILITOT 0.3 12/11/2021   ALKPHOS 187 (H) 12/11/2021   AST 18 12/11/2021   ALT 11 12/11/2021   ANIONGAP 10 11/03/2019   Last hemoglobin A1c Lab Results  Component Value Date   HGBA1C 7.1 (H) 12/11/2021      The ASCVD Risk score (Arnett DK, et al., 2019) failed to calculate for the following reasons:   The patient has a prior MI or stroke diagnosis    Assessment & Plan:   Problem List Items Addressed This Visit     Hyperlipidemia associated with type 2 diabetes mellitus ([place])   Relevant Orders   Lipid panel   Hypertension associated with diabetes ([place])    Taking amlodipine 10 mg, carvedilol 6.25 mg and valsartan 320 mg as  prescribed.  These medications are managed by cardiology.  Her last cardiology visit was in January 2024.  She denies chest pain, shortness of breath, lower extremity edema, vision changes and headaches.  Last lab work done on December 11, 2021 will get today.  She reports that she is tolerating these medications well she will continue taking amlodipine 10 mg carvedilol 6.25 mg and valsartan 320 mg per cardiology.  Blood pressure elevated in office today 141/69, reports that it was 120/83 in doctor's office yesterday.      Relevant Orders   CBC with Differential/Platelet   Comprehensive metabolic panel   TSH + free T4   Osteoarthritis, multiple sites   Relevant Medications   HYDROcodone-acetaminophen (NORCO) 10-325 MG tablet   Lumbar spinal stenosis    Reports chronic back pain in which she gets hydrocodone -acetaminophen 10-325 mg.  PDMP reviewed reveals #30 given on 12/11/2021. No red flags.  She reports that she takes these only as needed.  #30 sent today, no refills.      Type 2 diabetes mellitus without complication, without long-term current use of insulin (HCC) - Primary    Taking Farxiga 10 mg as prescribed.  She denies chest pain or shortness of breath, vision changes.  Endorses increased thirst and urination.  Denies episodes of hypoglycemia.  Last A1c done December 11, 2021 7.1.  Reports that her fasting glucoses at home are 120.  She will continue Farxiga 10 mg as prescribed.  Reports her kidney specialist recommended that she go on Ozempic.  Will get A1c today and readjust medications according to results.  She will follow-up in 3 months with PCP.  Reports that microalbumin urine was performed yesterday per kidney specialist, she will send results to the office.      Relevant Orders   Comprehensive metabolic panel   Hemoglobin A1c  Agrees with plan of care discussed.  Questions answered. Reports microalbumin performed yesterday by kidney specialist, she will have results sent to  office.   Return in about 3 months (around 06/16/2022) for DM and HTN .    Chalmers Guest, FNP

## 2022-03-18 NOTE — Assessment & Plan Note (Signed)
Reports chronic back pain in which she gets hydrocodone -acetaminophen 10-325 mg.  PDMP reviewed reveals #30 given on 12/11/2021. No red flags.  She reports that she takes these only as needed.  #30 sent today, no refills.

## 2022-03-18 NOTE — Assessment & Plan Note (Addendum)
Taking amlodipine 10 mg, carvedilol 6.25 mg and valsartan 320 mg as prescribed.  These medications are managed by cardiology.  Her last cardiology visit was in January 2024.  She denies chest pain, shortness of breath, lower extremity edema, vision changes and headaches.  Last lab work done on December 11, 2021 will get today.  She reports that she is tolerating these medications well she will continue taking amlodipine 10 mg carvedilol 6.25 mg and valsartan 320 mg per cardiology.  Blood pressure elevated in office today 141/69, reports that it was 120/83 in doctor's office yesterday.

## 2022-03-18 NOTE — Assessment & Plan Note (Addendum)
Taking Farxiga 10 mg as prescribed.  She denies chest pain or shortness of breath, vision changes.  Endorses increased thirst and urination.  Denies episodes of hypoglycemia.  Last A1c done December 11, 2021 7.1.  Reports that her fasting glucoses at home are 120.  She will continue Farxiga 10 mg as prescribed.  Reports her kidney specialist recommended that she go on Ozempic.  Will get A1c today and readjust medications according to results.  She will follow-up in 3 months with PCP.  Reports that microalbumin urine was performed yesterday per kidney specialist, she will send results to the office.

## 2022-03-19 LAB — COMPREHENSIVE METABOLIC PANEL
ALT: 12 IU/L (ref 0–32)
AST: 17 IU/L (ref 0–40)
Albumin/Globulin Ratio: 1.6 (ref 1.2–2.2)
Albumin: 4.3 g/dL (ref 3.8–4.8)
Alkaline Phosphatase: 185 IU/L — ABNORMAL HIGH (ref 44–121)
BUN/Creatinine Ratio: 26 (ref 12–28)
BUN: 25 mg/dL (ref 8–27)
Bilirubin Total: 0.4 mg/dL (ref 0.0–1.2)
CO2: 17 mmol/L — ABNORMAL LOW (ref 20–29)
Calcium: 9.9 mg/dL (ref 8.7–10.3)
Chloride: 104 mmol/L (ref 96–106)
Creatinine, Ser: 0.96 mg/dL (ref 0.57–1.00)
Globulin, Total: 2.7 g/dL (ref 1.5–4.5)
Glucose: 129 mg/dL — ABNORMAL HIGH (ref 70–99)
Potassium: 4.3 mmol/L (ref 3.5–5.2)
Sodium: 142 mmol/L (ref 134–144)
Total Protein: 7 g/dL (ref 6.0–8.5)
eGFR: 62 mL/min/{1.73_m2} (ref >59)

## 2022-03-19 LAB — CBC WITH DIFFERENTIAL/PLATELET
Basophils Absolute: 0 10*3/uL (ref 0.0–0.2)
Basos: 1 %
EOS (ABSOLUTE): 0.2 10*3/uL (ref 0.0–0.4)
Eos: 2 %
Hematocrit: 42.4 % (ref 34.0–46.6)
Hemoglobin: 13.2 g/dL (ref 11.1–15.9)
Immature Grans (Abs): 0 10*3/uL (ref 0.0–0.1)
Immature Granulocytes: 0 %
Lymphocytes Absolute: 1.9 10*3/uL (ref 0.7–3.1)
Lymphs: 22 %
MCH: 24.8 pg — ABNORMAL LOW (ref 26.6–33.0)
MCHC: 31.1 g/dL — ABNORMAL LOW (ref 31.5–35.7)
MCV: 80 fL (ref 79–97)
Monocytes Absolute: 0.7 10*3/uL (ref 0.1–0.9)
Monocytes: 8 %
Neutrophils Absolute: 5.6 10*3/uL (ref 1.4–7.0)
Neutrophils: 67 %
Platelets: 264 10*3/uL (ref 150–450)
RBC: 5.33 x10E6/uL — ABNORMAL HIGH (ref 3.77–5.28)
RDW: 14 % (ref 11.7–15.4)
WBC: 8.4 10*3/uL (ref 3.4–10.8)

## 2022-03-19 LAB — HEMOGLOBIN A1C
Est. average glucose Bld gHb Est-mCnc: 163 mg/dL
Hgb A1c MFr Bld: 7.3 % — ABNORMAL HIGH (ref 4.8–5.6)

## 2022-03-19 LAB — TSH+FREE T4
Free T4: 1.34 ng/dL (ref 0.82–1.77)
TSH: 3.83 u[IU]/mL (ref 0.450–4.500)

## 2022-03-19 LAB — LIPID PANEL
Chol/HDL Ratio: 2.6 ratio (ref 0.0–4.4)
Cholesterol, Total: 125 mg/dL (ref 100–199)
HDL: 48 mg/dL (ref >39)
LDL Chol Calc (NIH): 51 mg/dL (ref 0–99)
Triglycerides: 150 mg/dL — ABNORMAL HIGH (ref 0–149)
VLDL Cholesterol Cal: 26 mg/dL (ref 5–40)

## 2022-03-20 ENCOUNTER — Encounter: Payer: Self-pay | Admitting: Family Medicine

## 2022-04-03 ENCOUNTER — Telehealth (HOSPITAL_COMMUNITY): Payer: Self-pay

## 2022-04-03 NOTE — Telephone Encounter (Signed)
Detailed instructions left on the patient's answering machine. Asked to call back with any questions. S.Ayen Viviano EMTP/CCT

## 2022-04-07 ENCOUNTER — Ambulatory Visit (HOSPITAL_COMMUNITY): Payer: PPO

## 2022-04-08 ENCOUNTER — Ambulatory Visit (HOSPITAL_COMMUNITY): Payer: PPO

## 2022-04-11 ENCOUNTER — Ambulatory Visit (INDEPENDENT_AMBULATORY_CARE_PROVIDER_SITE_OTHER): Payer: PPO | Admitting: Family Medicine

## 2022-04-11 VITALS — BP 134/63 | HR 66 | Temp 97.6°F | Ht 61.0 in | Wt 227.0 lb

## 2022-04-11 DIAGNOSIS — E119 Type 2 diabetes mellitus without complications: Secondary | ICD-10-CM

## 2022-04-11 DIAGNOSIS — R300 Dysuria: Secondary | ICD-10-CM | POA: Diagnosis not present

## 2022-04-11 LAB — POCT URINALYSIS DIPSTICK
Bilirubin, UA: NEGATIVE
Glucose, UA: POSITIVE — AB
Ketones, UA: NEGATIVE
Nitrite, UA: NEGATIVE
Protein, UA: POSITIVE — AB
Spec Grav, UA: 1.015 (ref 1.010–1.025)
Urobilinogen, UA: 0.2 E.U./dL
pH, UA: 5.5 (ref 5.0–8.0)

## 2022-04-11 MED ORDER — NITROFURANTOIN MONOHYD MACRO 100 MG PO CAPS: 100.0000 mg | ORAL_CAPSULE | Freq: Two times a day (BID) | ORAL | 0 refills | Status: AC

## 2022-04-11 MED ORDER — OZEMPIC (0.25 OR 0.5 MG/DOSE) 2 MG/3ML ~~LOC~~ SOPN: 0.2500 mg | PEN_INJECTOR | SUBCUTANEOUS | 1 refills | Status: DC

## 2022-04-11 NOTE — Progress Notes (Signed)
    Procedures performed today:    None.  Independent interpretation of notes and tests performed by another provider:   None.  Brief History, Exam, Impression, and Recommendations:    BP 134/63 (BP Location: Right Arm, Patient Position: Sitting, Cuff Size: Large)   Pulse 66   Temp 97.6 F (36.4 C) (Oral)   Ht 5\' 1"  (1.549 m)   Wt 227 lb (103 kg)   LMP  (LMP Unknown)   SpO2 99%   BMI 42.89 kg/m   Dysuria Patient reports that she woke up this morning with symptoms of pain/burning with urination.  She has not had any abdominal pain, no back pain.  She has experienced UTIs in the past, none recently.  She does feel that symptoms are similar to prior UTIs.  She does currently take [place].  Denies any fever, chills, sweats. On exam, patient is in no acute distress, patient is afebrile. Urine dip with evidence of trace leukocyte Estrace, negative nitrites.  Does show positive glucose which is not unexpected given that she is taking [place]. She does follow with nephrologist, Dr. Malen Gauze.  She had results from prior urine testing and office notes through Dr. Mercy Riding office.  It does appear that prior urine testing did show presence of E. coli which was susceptible to nitrofurantoin.  She indicates that prior treatment greater than 1 month ago was with reportedly doxycycline. Will proceed with antibiotic therapy today with use of nitrofurantoin.  We will proceed with urine culture to assess for specific organism and antibiotic susceptibility and manage antibiotic therapy accordingly We will need to continue to monitor if patient is developing recurrent UTIs as medication changes may be needed in that situation, particular related to [place]  Type 2 diabetes mellitus without complication, without long-term current use of insulin (HCC) Patient has been having gradually increasing hemoglobin A1c, however it is still at goal.  Most recent hemoglobin A1c at 7.3%.  Given patient age, risk factors,  goals of care, feel that maintaining hemoglobin A1c less than 7.5% is appropriate.  Given recent A1c changes, patient wonders about starting new medication such as Ozempic.  We did discuss medication at length today as well as potential risk, side effects, benefits.  We discussed general considerations related to blood sugar control, A1c target, risk of aggressive blood sugar control and hypoglycemia. After discussion, patient would like to proceed with use of Ozempic.  Will start at low-dose and plan to gradually titrate dose of medication about every 1 month as tolerated Plan follow-up in about 1 month to assess progress  Return in about 4 weeks (around 05/09/2022).   ___________________________________________ Matty Vanroekel de [place], MD, ABFM, CAQSM Primary Care and Sports Medicine [facility]

## 2022-04-14 NOTE — Assessment & Plan Note (Signed)
Patient reports that she woke up this morning with symptoms of pain/burning with urination.  She has not had any abdominal pain, no back pain.  She has experienced UTIs in the past, none recently.  She does feel that symptoms are similar to prior UTIs.  She does currently take [place].  Denies any fever, chills, sweats. On exam, patient is in no acute distress, patient is afebrile. Urine dip with evidence of trace leukocyte Estrace, negative nitrites.  Does show positive glucose which is not unexpected given that she is taking [place]. She does follow with nephrologist, Dr. Royce Macadamia.  She had results from prior urine testing and office notes through Dr. Luis Abed office.  It does appear that prior urine testing did show presence of E. coli which was susceptible to nitrofurantoin.  She indicates that prior treatment greater than 1 month ago was with reportedly doxycycline. Will proceed with antibiotic therapy today with use of nitrofurantoin.  We will proceed with urine culture to assess for specific organism and antibiotic susceptibility and manage antibiotic therapy accordingly We will need to continue to monitor if patient is developing recurrent UTIs as medication changes may be needed in that situation, particular related to [place]

## 2022-04-14 NOTE — Assessment & Plan Note (Signed)
Patient has been having gradually increasing hemoglobin A1c, however it is still at goal.  Most recent hemoglobin A1c at 7.3%.  Given patient age, risk factors, goals of care, feel that maintaining hemoglobin A1c less than 7.5% is appropriate.  Given recent A1c changes, patient wonders about starting new medication such as Ozempic.  We did discuss medication at length today as well as potential risk, side effects, benefits.  We discussed general considerations related to blood sugar control, A1c target, risk of aggressive blood sugar control and hypoglycemia. After discussion, patient would like to proceed with use of Ozempic.  Will start at low-dose and plan to gradually titrate dose of medication about every 1 month as tolerated Plan follow-up in about 1 month to assess progress

## 2022-04-15 ENCOUNTER — Ambulatory Visit (HOSPITAL_BASED_OUTPATIENT_CLINIC_OR_DEPARTMENT_OTHER): Payer: PPO | Admitting: Family Medicine

## 2022-04-15 ENCOUNTER — Encounter (HOSPITAL_BASED_OUTPATIENT_CLINIC_OR_DEPARTMENT_OTHER): Payer: Self-pay

## 2022-04-15 LAB — URINE CULTURE

## 2022-04-16 ENCOUNTER — Other Ambulatory Visit (HOSPITAL_BASED_OUTPATIENT_CLINIC_OR_DEPARTMENT_OTHER): Payer: Self-pay | Admitting: Family Medicine

## 2022-04-16 MED ORDER — AMOXICILLIN-POT CLAVULANATE 500-125 MG PO TABS: 1.0000 | ORAL_TABLET | Freq: Two times a day (BID) | ORAL | 0 refills | Status: DC

## 2022-04-18 ENCOUNTER — Ambulatory Visit (HOSPITAL_BASED_OUTPATIENT_CLINIC_OR_DEPARTMENT_OTHER): Payer: PPO | Admitting: Family

## 2022-04-21 ENCOUNTER — Encounter: Payer: Self-pay | Admitting: Nephrology

## 2022-04-28 ENCOUNTER — Ambulatory Visit (HOSPITAL_BASED_OUTPATIENT_CLINIC_OR_DEPARTMENT_OTHER)
Admission: RE | Admit: 2022-04-28 | Discharge: 2022-04-28 | Disposition: A | Discharge status: Home / Self Care | Payer: PPO | Source: Ambulatory Visit | Attending: Family Medicine | Admitting: Family Medicine

## 2022-04-28 ENCOUNTER — Ambulatory Visit (HOSPITAL_BASED_OUTPATIENT_CLINIC_OR_DEPARTMENT_OTHER)
Admission: RE | Admit: 2022-04-28 | Discharge: 2022-04-28 | Disposition: A | Discharge status: Home / Self Care | Payer: PPO | Source: Ambulatory Visit | Attending: Acute Care | Admitting: Acute Care

## 2022-04-28 DIAGNOSIS — Z1231 Encounter for screening mammogram for malignant neoplasm of breast: Secondary | ICD-10-CM | POA: Diagnosis not present

## 2022-04-28 DIAGNOSIS — Z87891 Personal history of nicotine dependence: Secondary | ICD-10-CM | POA: Diagnosis not present

## 2022-04-28 DIAGNOSIS — I2510 Atherosclerotic heart disease of native coronary artery without angina pectoris: Secondary | ICD-10-CM | POA: Insufficient documentation

## 2022-04-28 DIAGNOSIS — J432 Centrilobular emphysema: Secondary | ICD-10-CM | POA: Diagnosis not present

## 2022-04-28 DIAGNOSIS — Z122 Encounter for screening for malignant neoplasm of respiratory organs: Secondary | ICD-10-CM | POA: Insufficient documentation

## 2022-04-28 DIAGNOSIS — I700 Atherosclerosis of aorta: Secondary | ICD-10-CM | POA: Diagnosis not present

## 2022-04-29 ENCOUNTER — Other Ambulatory Visit: Payer: Self-pay

## 2022-04-29 DIAGNOSIS — Z87891 Personal history of nicotine dependence: Secondary | ICD-10-CM

## 2022-04-30 ENCOUNTER — Encounter (HOSPITAL_BASED_OUTPATIENT_CLINIC_OR_DEPARTMENT_OTHER): Payer: Self-pay | Admitting: Family Medicine

## 2022-04-30 ENCOUNTER — Ambulatory Visit (INDEPENDENT_AMBULATORY_CARE_PROVIDER_SITE_OTHER): Payer: PPO | Admitting: Family Medicine

## 2022-04-30 VITALS — BP 134/62 | HR 59 | Ht 61.0 in | Wt 226.0 lb

## 2022-04-30 DIAGNOSIS — R300 Dysuria: Secondary | ICD-10-CM | POA: Diagnosis not present

## 2022-04-30 DIAGNOSIS — E119 Type 2 diabetes mellitus without complications: Secondary | ICD-10-CM

## 2022-04-30 NOTE — Assessment & Plan Note (Signed)
She reports that she was able to start Ozempic without any issue.  She has been administering medication and indicates that she has been tolerating this.  Denies any notable symptoms of nausea or vomiting. Given the patient has been tolerating medication, we can continue with this at the present time.  We discussed options including dose titration or continuing with current dose of Ozempic.  For now we will continue with the same dose and plan for follow-up in about 6 to 8 weeks to assess progress.  We will plan to recheck hemoglobin A1c around that time as well and determine need for dose titration of Ozempic

## 2022-04-30 NOTE — Assessment & Plan Note (Signed)
Patient reports that symptoms have resolved after antibiotic therapy.  She indicates that she is doing quite well.  She does report that she has had recurrent UTI over the years, even prior to initiating [place].  She does not necessarily feel that she has had increasing frequency of UTIs since starting [place] Did discuss concern related to SGLT2 inhibitors and risk of recurrent UTIs.  Advised on monitoring for this as medication may need to be discontinued if causing notable side effects.  Patient voiced understanding.  We will continue with monitoring.

## 2022-04-30 NOTE — Progress Notes (Signed)
    Procedures performed today:    None.  Independent interpretation of notes and tests performed by another provider:   None.  Brief History, Exam, Impression, and Recommendations:    BP 134/62 (BP Location: Right Arm, Patient Position: Sitting, Cuff Size: Large)   Pulse (!) 59   Ht 5\' 1"  (1.549 m)   Wt 226 lb (102.5 kg)   LMP  (LMP Unknown)   SpO2 100%   BMI 42.70 kg/m   Dysuria Patient reports that symptoms have resolved after antibiotic therapy.  She indicates that she is doing quite well.  She does report that she has had recurrent UTI over the years, even prior to initiating [place].  She does not necessarily feel that she has had increasing frequency of UTIs since starting [place] Did discuss concern related to SGLT2 inhibitors and risk of recurrent UTIs.  Advised on monitoring for this as medication may need to be discontinued if causing notable side effects.  Patient voiced understanding.  We will continue with monitoring.  Type 2 diabetes mellitus without complication, without long-term current use of insulin ([place]) She reports that she was able to start Ozempic without any issue.  She has been administering medication and indicates that she has been tolerating this.  Denies any notable symptoms of nausea or vomiting. Given the patient has been tolerating medication, we can continue with this at the present time.  We discussed options including dose titration or continuing with current dose of Ozempic.  For now we will continue with the same dose and plan for follow-up in about 6 to 8 weeks to assess progress.  We will plan to recheck hemoglobin A1c around that time as well and determine need for dose titration of Ozempic  Return in about 2 months (around 06/30/2022) for DM.   ___________________________________________ Jaton Eilers de [place], MD, ABFM, [facility] Primary Care and [place]

## 2022-05-04 ENCOUNTER — Other Ambulatory Visit (HOSPITAL_BASED_OUTPATIENT_CLINIC_OR_DEPARTMENT_OTHER): Payer: Self-pay | Admitting: Family

## 2022-05-04 DIAGNOSIS — I25118 Atherosclerotic heart disease of native coronary artery with other forms of angina pectoris: Secondary | ICD-10-CM

## 2022-05-05 ENCOUNTER — Telehealth (HOSPITAL_BASED_OUTPATIENT_CLINIC_OR_DEPARTMENT_OTHER): Payer: Self-pay

## 2022-05-05 DIAGNOSIS — I25118 Atherosclerotic heart disease of native coronary artery with other forms of angina pectoris: Secondary | ICD-10-CM

## 2022-05-05 MED ORDER — CLOPIDOGREL BISULFATE 75 MG PO TABS
75.0000 mg | ORAL_TABLET | Freq: Every day | ORAL | 2 refills | Status: DC
Start: 2022-05-05 — End: 2023-05-12
  Filled 2023-03-16 – 2023-03-19 (×2): qty 90, 90d supply, fill #0
  Filled 2023-03-23: qty 30, 30d supply, fill #0
  Filled 2023-04-06 – 2023-04-15 (×2): qty 30, 30d supply, fill #1
  Filled 2023-04-24 – 2023-05-12 (×2): qty 30, 30d supply, fill #2

## 2022-05-05 MED ORDER — AMLODIPINE BESYLATE 10 MG PO TABS
10.0000 mg | ORAL_TABLET | Freq: Every day | ORAL | 2 refills | Status: DC
  Filled 2023-03-16 – 2023-03-19 (×2): qty 90, 90d supply, fill #0
  Filled 2023-03-23: qty 30, 30d supply, fill #0
  Filled 2023-04-06 – 2023-04-15 (×2): qty 30, 30d supply, fill #1
  Filled 2023-04-24 – 2023-05-12 (×2): qty 30, 30d supply, fill #2

## 2022-05-05 MED ORDER — CARVEDILOL 6.25 MG PO TABS
6.2500 mg | ORAL_TABLET | Freq: Two times a day (BID) | ORAL | 2 refills | Status: DC
Start: 2022-05-05 — End: 2023-05-12
  Filled 2023-03-16 – 2023-03-19 (×2): qty 180, 90d supply, fill #0
  Filled 2023-03-23: qty 60, 30d supply, fill #0
  Filled 2023-04-06 – 2023-04-15 (×2): qty 60, 30d supply, fill #1
  Filled 2023-04-24 – 2023-05-12 (×2): qty 60, 30d supply, fill #2

## 2022-05-05 NOTE — Telephone Encounter (Signed)
Received fax from [facility] by [place] requesting refills for Carvedilol, Plavix, and Amlodipine. Rx request sent to pharmacy.

## 2022-05-05 NOTE — Telephone Encounter (Signed)
Rx(s) sent to pharmacy electronically.  

## 2022-05-06 ENCOUNTER — Telehealth (HOSPITAL_BASED_OUTPATIENT_CLINIC_OR_DEPARTMENT_OTHER): Payer: Self-pay | Admitting: Family

## 2022-05-06 DIAGNOSIS — I152 Hypertension secondary to endocrine disorders: Secondary | ICD-10-CM

## 2022-05-06 MED ORDER — VALSARTAN 320 MG PO TABS
320.0000 mg | ORAL_TABLET | Freq: Every day | ORAL | 3 refills | Status: DC
  Filled 2023-03-16: qty 30, 30d supply, fill #0
  Filled 2023-03-19: qty 60, 60d supply, fill #0
  Filled 2023-03-23: qty 30, 30d supply, fill #0
  Filled 2023-04-06 – 2023-04-15 (×2): qty 30, 30d supply, fill #1

## 2022-05-06 NOTE — Telephone Encounter (Signed)
*  STAT* If patient is at the pharmacy, call can be transferred to refill team.   1. Which medications need to be refilled? (please list name of each medication and dose if known) Amlodipine, Carvedilol , Clopidogrel, Valsartan, and Vitamin D3  2. Which pharmacy/location (including street and city if local pharmacy) is medication to be sent to? [facility]  3. Do they need a 30 day or 90 day supply? 90 days and refills

## 2022-05-06 NOTE — Telephone Encounter (Signed)
Refills for Amlodipine, Carvedilol, and Clopidogrel sent to [facility] yesterday. Rx for Valsartan sent today.  Will need to have D3 sent in by PCP.

## 2022-06-04 ENCOUNTER — Other Ambulatory Visit (HOSPITAL_BASED_OUTPATIENT_CLINIC_OR_DEPARTMENT_OTHER): Payer: Self-pay | Admitting: Family Medicine

## 2022-06-04 DIAGNOSIS — E119 Type 2 diabetes mellitus without complications: Secondary | ICD-10-CM

## 2022-06-12 ENCOUNTER — Ambulatory Visit (HOSPITAL_BASED_OUTPATIENT_CLINIC_OR_DEPARTMENT_OTHER): Payer: HMO | Admitting: Family Medicine

## 2022-06-16 ENCOUNTER — Ambulatory Visit (HOSPITAL_BASED_OUTPATIENT_CLINIC_OR_DEPARTMENT_OTHER): Payer: PPO | Admitting: Family Medicine

## 2022-06-24 ENCOUNTER — Other Ambulatory Visit (HOSPITAL_BASED_OUTPATIENT_CLINIC_OR_DEPARTMENT_OTHER): Payer: PPO

## 2022-06-24 DIAGNOSIS — E119 Type 2 diabetes mellitus without complications: Secondary | ICD-10-CM

## 2022-06-25 LAB — HEMOGLOBIN A1C
Est. average glucose Bld gHb Est-mCnc: 148 mg/dL
Hgb A1c MFr Bld: 6.8 % — ABNORMAL HIGH (ref 4.8–5.6)

## 2022-07-01 ENCOUNTER — Encounter (HOSPITAL_BASED_OUTPATIENT_CLINIC_OR_DEPARTMENT_OTHER): Payer: Self-pay | Admitting: Family Medicine

## 2022-07-01 ENCOUNTER — Ambulatory Visit (INDEPENDENT_AMBULATORY_CARE_PROVIDER_SITE_OTHER): Payer: PPO | Admitting: Family Medicine

## 2022-07-01 DIAGNOSIS — E119 Type 2 diabetes mellitus without complications: Secondary | ICD-10-CM | POA: Diagnosis not present

## 2022-07-01 DIAGNOSIS — N2581 Secondary hyperparathyroidism of renal origin: Secondary | ICD-10-CM | POA: Diagnosis not present

## 2022-07-01 DIAGNOSIS — M159 Polyosteoarthritis, unspecified: Secondary | ICD-10-CM | POA: Diagnosis not present

## 2022-07-01 DIAGNOSIS — Z7984 Long term (current) use of oral hypoglycemic drugs: Secondary | ICD-10-CM | POA: Diagnosis not present

## 2022-07-01 DIAGNOSIS — M150 Primary generalized (osteo)arthritis: Secondary | ICD-10-CM

## 2022-07-01 MED ORDER — OZEMPIC (0.25 OR 0.5 MG/DOSE) 2 MG/3ML ~~LOC~~ SOPN
0.5000 mg | PEN_INJECTOR | SUBCUTANEOUS | 1 refills | Status: DC
Start: 2022-07-01 — End: 2023-02-12

## 2022-07-01 MED ORDER — VITAMIN D 50 MCG (2000 UT) PO TABS: 2000.0000 [IU] | ORAL_TABLET | Freq: Every day | ORAL | 1 refills | Status: DC

## 2022-07-01 MED ORDER — HYDROCODONE-ACETAMINOPHEN 10-325 MG PO TABS
1.0000 | ORAL_TABLET | Freq: Every day | ORAL | 0 refills | Status: DC | PRN
Start: 2022-07-01 — End: 2022-12-02

## 2022-07-01 NOTE — Assessment & Plan Note (Signed)
Requesting refill of Norco today.  Reviewed PDMP, prior prescriptions for 30 tablets to be taken as needed.  Has received this medication intermittently over the past multiple years.  No red flags identified on PDMP review.  Discussed potential side effects related to chronic narcotic use.  Refill of medication provided today.

## 2022-07-01 NOTE — Assessment & Plan Note (Addendum)
Recent A1c showed improvement since last check, she continues with Ozempic at 0.25 mg dose. Denies any significant GI side effects.  She is interested in titrating dose of medication if possible.  We discussed potential side effects related to the medication and possibility of this occurring when dose is titrated.  We will increase to 0.5 mg weekly dose, again discussed potential side effects. Plan for follow-up in about 2 to 3 months to assess progress with medication and whether we would continue with further dose titration Plan to check urine microalbumin/creatinine ratio at next office visit

## 2022-07-01 NOTE — Patient Instructions (Signed)
  Medication Instructions:  Your physician recommends that you continue on your current medications as directed. Please refer to the Current Medication list given to you today. --If you need a refill on any your medications before your next appointment, please call your pharmacy first. If no refills are authorized on file call the office.-- Lab Work: Your physician has recommended that you have lab work today: No If you have labs (blood work) drawn today and your tests are completely normal, you will receive your results via MyChart message OR a phone call from our staff.  Please ensure you check your voicemail in the event that you authorized detailed messages to be left on a delegated number. If you have any lab test that is abnormal or we need to change your treatment, we will call you to review the results.  Referrals/Procedures/Imaging: No  Follow-Up: Your next appointment:   Your physician recommends that you schedule a follow-up appointment in 2 months with Dr. de [place]  You will receive a text message or e-mail with a link to a survey about your care and experience with us today! We would greatly appreciate your feedback!   Thanks for letting us be apart of your health journey!!  Primary Care and Sports Medicine   Dr. Raymond de [place]   We encourage you to activate your patient portal called "MyChart".  Sign up information is provided on this After Visit Summary.  MyChart is used to connect with patients for Virtual Visits (Telemedicine).  Patients are able to view lab/test results, encounter notes, upcoming appointments, etc.  Non-urgent messages can be sent to your provider as well. To learn more about what you can do with MyChart, please visit --  [email or web address].    

## 2022-07-01 NOTE — Progress Notes (Signed)
    Procedures performed today:    None.  Independent interpretation of notes and tests performed by another provider:   None.  Brief History, Exam, Impression, and Recommendations:    BP (!) 156/61 (BP Location: Left Arm, Patient Position: Sitting, Cuff Size: Large)   Pulse 70   Ht 5\' 1"  (1.549 m)   Wt 222 lb 6.4 oz (100.9 kg)   LMP  (LMP Unknown)   SpO2 100%   BMI 42.02 kg/m   Type 2 diabetes mellitus without complication, without long-term current use of insulin (HCC) Recent A1c showed improvement since last check, she continues with Ozempic at 0.25 mg dose. Denies any significant GI side effects.  She is interested in titrating dose of medication if possible.  We discussed potential side effects related to the medication and possibility of this occurring when dose is titrated.  We will increase to 0.5 mg weekly dose, again discussed potential side effects. Plan for follow-up in about 2 to 3 months to assess progress with medication and whether we would continue with further dose titration Plan to check urine microalbumin/creatinine ratio at next office visit  Osteoarthritis, multiple sites Requesting refill of Norco today.  Reviewed PDMP, prior prescriptions for 30 tablets to be taken as needed.  Has received this medication intermittently over the past multiple years.  No red flags identified on PDMP review.  Discussed potential side effects related to chronic narcotic use.  Refill of medication provided today.  Return in about 2 months (around 08/31/2022) for DM. Patient thinks that she is overdue for follow-up with cardiology, encourage patient to call and schedule follow-up visit with the cardiologist office   ___________________________________________ Yurianna Tusing de [place], MD, ABFM, CAQSM Primary Care and Sports Medicine [facility]

## 2022-07-17 ENCOUNTER — Telehealth (HOSPITAL_BASED_OUTPATIENT_CLINIC_OR_DEPARTMENT_OTHER): Payer: Self-pay | Admitting: Family Medicine

## 2022-07-17 NOTE — Telephone Encounter (Signed)
Per pharmacy insurance not covering it needs prior auth.. Hydrocodone

## 2022-07-24 DIAGNOSIS — L821 Other seborrheic keratosis: Secondary | ICD-10-CM | POA: Diagnosis not present

## 2022-07-24 DIAGNOSIS — D229 Melanocytic nevi, unspecified: Secondary | ICD-10-CM | POA: Diagnosis not present

## 2022-07-24 DIAGNOSIS — L814 Other melanin hyperpigmentation: Secondary | ICD-10-CM | POA: Diagnosis not present

## 2022-07-24 DIAGNOSIS — L578 Other skin changes due to chronic exposure to nonionizing radiation: Secondary | ICD-10-CM | POA: Diagnosis not present

## 2022-07-24 DIAGNOSIS — D1801 Hemangioma of skin and subcutaneous tissue: Secondary | ICD-10-CM | POA: Diagnosis not present

## 2022-08-05 ENCOUNTER — Encounter (HOSPITAL_BASED_OUTPATIENT_CLINIC_OR_DEPARTMENT_OTHER): Payer: Self-pay | Admitting: Family Medicine

## 2022-08-19 ENCOUNTER — Other Ambulatory Visit (HOSPITAL_BASED_OUTPATIENT_CLINIC_OR_DEPARTMENT_OTHER): Payer: Self-pay

## 2022-08-19 DIAGNOSIS — E1121 Type 2 diabetes mellitus with diabetic nephropathy: Secondary | ICD-10-CM

## 2022-08-19 DIAGNOSIS — E1169 Type 2 diabetes mellitus with other specified complication: Secondary | ICD-10-CM

## 2022-08-19 DIAGNOSIS — N1831 Chronic kidney disease, stage 3a: Secondary | ICD-10-CM

## 2022-08-19 MED ORDER — DAPAGLIFLOZIN PROPANEDIOL 10 MG PO TABS
10.0000 mg | ORAL_TABLET | Freq: Every day | ORAL | 3 refills | Status: DC
Start: 2022-08-19 — End: 2023-06-12

## 2022-08-21 ENCOUNTER — Encounter (HOSPITAL_BASED_OUTPATIENT_CLINIC_OR_DEPARTMENT_OTHER): Payer: Self-pay

## 2022-08-25 ENCOUNTER — Encounter (HOSPITAL_BASED_OUTPATIENT_CLINIC_OR_DEPARTMENT_OTHER): Payer: Self-pay | Admitting: Family Medicine

## 2022-09-01 ENCOUNTER — Encounter (HOSPITAL_BASED_OUTPATIENT_CLINIC_OR_DEPARTMENT_OTHER): Payer: Self-pay | Admitting: Family Medicine

## 2022-09-01 ENCOUNTER — Ambulatory Visit (INDEPENDENT_AMBULATORY_CARE_PROVIDER_SITE_OTHER): Payer: PPO | Admitting: Family Medicine

## 2022-09-01 VITALS — BP 132/68 | HR 63 | Ht 61.0 in | Wt 220.9 lb

## 2022-09-01 DIAGNOSIS — E1122 Type 2 diabetes mellitus with diabetic chronic kidney disease: Secondary | ICD-10-CM | POA: Diagnosis not present

## 2022-09-01 DIAGNOSIS — N1831 Chronic kidney disease, stage 3a: Secondary | ICD-10-CM

## 2022-09-01 DIAGNOSIS — M79606 Pain in leg, unspecified: Secondary | ICD-10-CM | POA: Insufficient documentation

## 2022-09-01 DIAGNOSIS — M79605 Pain in left leg: Secondary | ICD-10-CM

## 2022-09-01 DIAGNOSIS — M79604 Pain in right leg: Secondary | ICD-10-CM | POA: Diagnosis not present

## 2022-09-01 NOTE — Assessment & Plan Note (Signed)
She does indicate having some pain in bilateral lower extremities.  She has tingling and sensory changes in bilateral toes and feet which is likely related to her diabetes.  However, she has had new symptoms of pain in bilateral lower extremities, indicating along the medial aspect of right lower leg and lateral aspect of left lower leg.  This has been a somewhat recent issue for patient.  She does feel that symptoms are similar to something she has felt in the past which did improve when she is not walking as much.  She denies associated numbness or tingling with the symptoms.  She will occasionally try over-the-counter pain patches topically. Concern for possible nerve related pain, however does not clearly fit with diabetic neuropathy, thus we will proceed with referral to neurology for further evaluation, consideration of possible EMG

## 2022-09-01 NOTE — Assessment & Plan Note (Signed)
Patient has had some difficulty with obtaining medications through pharmaceutical patient assistance program.  It seems there were some issues where pharmaceutical company had incorrect address and/or license information for myself which led to some of these delays.  This caused some issues with her Ozempic and [place]. She does indicate that she was able to get [place], still waiting on Ozempic.  She had been able to transition to 0.5 mg dose of Ozempic and did not have any issues with this, had administered a total of 2 doses at this dose.  She has been without this now for about 5 to 6 weeks.  Otherwise, patient reports that she has been doing well, no issues with polyuria or polydipsia. Last hemoglobin A1c at goal at 6.8% about 2 months ago At this time, can continue with current medication regimen, can continue with lessening of that she has available Once able to obtain Ozempic, would restart with lowest dose with gradual titration given duration of time that patient was off of Ozempic

## 2022-09-01 NOTE — Progress Notes (Signed)
    Procedures performed today:    None.  Independent interpretation of notes and tests performed by another provider:   None.  Brief History, Exam, Impression, and Recommendations:    BP 132/68 (BP Location: Left Arm, Patient Position: Sitting, Cuff Size: Large)   Pulse 63   Ht 5\' 1"  (1.549 m)   Wt 220 lb 14.4 oz (100.2 kg)   LMP  (LMP Unknown)   SpO2 98%   BMI 41.74 kg/m   Type 2 diabetes mellitus with stage 3a chronic kidney disease, without long-term current use of insulin (HCC) Assessment & Plan: Patient has had some difficulty with obtaining medications through pharmaceutical patient assistance program.  It seems there were some issues where pharmaceutical company had incorrect address and/or license information for myself which led to some of these delays.  This caused some issues with her Ozempic and [place]. She does indicate that she was able to get [place], still waiting on Ozempic.  She had been able to transition to 0.5 mg dose of Ozempic and did not have any issues with this, had administered a total of 2 doses at this dose.  She has been without this now for about 5 to 6 weeks.  Otherwise, patient reports that she has been doing well, no issues with polyuria or polydipsia. Last hemoglobin A1c at goal at 6.8% about 2 months ago At this time, can continue with current medication regimen, can continue with lessening of that she has available Once able to obtain Ozempic, would restart with lowest dose with gradual titration given duration of time that patient was off of Ozempic   Pain in both lower extremities Assessment & Plan: She does indicate having some pain in bilateral lower extremities.  She has tingling and sensory changes in bilateral toes and feet which is likely related to her diabetes.  However, she has had new symptoms of pain in bilateral lower extremities, indicating along the medial aspect of right lower leg and lateral aspect of left lower leg.  This has  been a somewhat recent issue for patient.  She does feel that symptoms are similar to something she has felt in the past which did improve when she is not walking as much.  She denies associated numbness or tingling with the symptoms.  She will occasionally try over-the-counter pain patches topically. Concern for possible nerve related pain, however does not clearly fit with diabetic neuropathy, thus we will proceed with referral to neurology for further evaluation, consideration of possible EMG  Orders: -     Ambulatory referral to Neurology  Return in about 3 months (around 12/02/2022) for diabetes, hypertension.  Spent 31 minutes on this patient encounter, including preparation, chart review, face-to-face counseling with patient and coordination of care, and documentation of encounter   ___________________________________________ Ralynn [place] de [place], MD, ABFM, [facility] Primary Care and Sports Medicine [facility]

## 2022-09-03 ENCOUNTER — Telehealth (HOSPITAL_BASED_OUTPATIENT_CLINIC_OR_DEPARTMENT_OTHER): Payer: Self-pay

## 2022-09-03 NOTE — Telephone Encounter (Signed)
Received approval letter from [employer]. Patient medication will be delivered to this office in 10-14 business days.

## 2022-09-09 ENCOUNTER — Encounter: Payer: Self-pay | Admitting: Neurology

## 2022-09-10 DIAGNOSIS — E119 Type 2 diabetes mellitus without complications: Secondary | ICD-10-CM | POA: Diagnosis not present

## 2022-09-10 DIAGNOSIS — Z961 Presence of intraocular lens: Secondary | ICD-10-CM | POA: Diagnosis not present

## 2022-09-10 DIAGNOSIS — H43813 Vitreous degeneration, bilateral: Secondary | ICD-10-CM | POA: Diagnosis not present

## 2022-09-10 LAB — HM DIABETES EYE EXAM

## 2022-09-14 ENCOUNTER — Encounter (HOSPITAL_BASED_OUTPATIENT_CLINIC_OR_DEPARTMENT_OTHER): Payer: Self-pay | Admitting: Family Medicine

## 2022-09-15 ENCOUNTER — Other Ambulatory Visit (HOSPITAL_BASED_OUTPATIENT_CLINIC_OR_DEPARTMENT_OTHER): Payer: Self-pay

## 2022-09-15 DIAGNOSIS — J449 Chronic obstructive pulmonary disease, unspecified: Secondary | ICD-10-CM

## 2022-09-15 DIAGNOSIS — Z0000 Encounter for general adult medical examination without abnormal findings: Secondary | ICD-10-CM

## 2022-09-15 DIAGNOSIS — E1121 Type 2 diabetes mellitus with diabetic nephropathy: Secondary | ICD-10-CM

## 2022-09-15 DIAGNOSIS — I2510 Atherosclerotic heart disease of native coronary artery without angina pectoris: Secondary | ICD-10-CM

## 2022-09-15 DIAGNOSIS — E1169 Type 2 diabetes mellitus with other specified complication: Secondary | ICD-10-CM

## 2022-09-15 DIAGNOSIS — I152 Hypertension secondary to endocrine disorders: Secondary | ICD-10-CM

## 2022-09-15 DIAGNOSIS — E1159 Type 2 diabetes mellitus with other circulatory complications: Secondary | ICD-10-CM

## 2022-09-15 DIAGNOSIS — N183 Chronic kidney disease, stage 3 unspecified: Secondary | ICD-10-CM

## 2022-09-15 MED ORDER — FREESTYLE LANCETS MISC: 5 refills | Status: DC

## 2022-09-15 MED ORDER — FREESTYLE LITE TEST VI STRP
ORAL_STRIP | 11 refills | Status: AC
Start: 2022-09-15 — End: ?

## 2022-09-15 MED ORDER — ALBUTEROL SULFATE HFA 108 (90 BASE) MCG/ACT IN AERS
2.0000 | INHALATION_SPRAY | Freq: Four times a day (QID) | RESPIRATORY_TRACT | 3 refills | Status: AC | PRN
Start: 2022-09-15 — End: ?

## 2022-09-15 MED ORDER — FREESTYLE LIBRE 3 SENSOR MISC
1.0000 [IU] | 11 refills | Status: DC
Start: 2022-09-15 — End: 2022-10-13

## 2022-09-15 MED ORDER — PROAIR HFA 108 (90 BASE) MCG/ACT IN AERS: 2.0000 | INHALATION_SPRAY | Freq: Four times a day (QID) | RESPIRATORY_TRACT | 3 refills | Status: DC | PRN

## 2022-09-16 ENCOUNTER — Other Ambulatory Visit (HOSPITAL_BASED_OUTPATIENT_CLINIC_OR_DEPARTMENT_OTHER): Payer: Self-pay

## 2022-09-16 ENCOUNTER — Telehealth (HOSPITAL_BASED_OUTPATIENT_CLINIC_OR_DEPARTMENT_OTHER): Payer: Self-pay | Admitting: Family Medicine

## 2022-09-16 MED ORDER — BLOOD GLUCOSE TEST VI STRP: 1.0000 | ORAL_STRIP | Freq: Three times a day (TID) | 0 refills | Status: DC

## 2022-09-16 MED ORDER — LANCETS MISC. MISC: 1.0000 | Freq: Three times a day (TID) | 0 refills | Status: AC

## 2022-09-16 MED ORDER — LANCET DEVICE MISC: 1.0000 | Freq: Three times a day (TID) | 0 refills | Status: AC

## 2022-09-16 MED ORDER — BLOOD GLUCOSE MONITORING SUPPL DEVI: 1.0000 | Freq: Three times a day (TID) | 0 refills | Status: AC

## 2022-09-16 NOTE — Telephone Encounter (Signed)
Pro Air --Brand name only script -- Gavin Pound has question regarding this script ==please call    Pro Air is No longer Available can they dispense the Generic

## 2022-09-16 NOTE — Telephone Encounter (Signed)
Spoke with Suzette Battiest at [facility], gave verbal order to give patient generic as she wishes to continue on ProAir.

## 2022-10-01 ENCOUNTER — Ambulatory Visit: Payer: PPO | Admitting: Neurology

## 2022-10-01 ENCOUNTER — Encounter: Payer: Self-pay | Admitting: Neurology

## 2022-10-01 VITALS — BP 125/64 | HR 68 | Ht 61.0 in | Wt 220.0 lb

## 2022-10-01 DIAGNOSIS — M79604 Pain in right leg: Secondary | ICD-10-CM | POA: Diagnosis not present

## 2022-10-01 DIAGNOSIS — M48061 Spinal stenosis, lumbar region without neurogenic claudication: Secondary | ICD-10-CM | POA: Diagnosis not present

## 2022-10-01 DIAGNOSIS — M79605 Pain in left leg: Secondary | ICD-10-CM | POA: Diagnosis not present

## 2022-10-01 NOTE — Patient Instructions (Signed)
Nerve testing of the legs  Start physical therapy for leg strengthening   ELECTROMYOGRAM AND NERVE CONDUCTION STUDIES (EMG/NCS) INSTRUCTIONS  How to Prepare The neurologist conducting the EMG will need to know if you have certain medical conditions. Tell the neurologist and other EMG lab personnel if you: Have a pacemaker or any other electrical medical device Take blood-thinning medications Have hemophilia, a blood-clotting disorder that causes prolonged bleeding Bathing Take a shower or bath shortly before your exam in order to remove oils from your skin. Don't apply lotions or creams before the exam.  What to Expect You'll likely be asked to change into a hospital gown for the procedure and lie down on an examination table. The following explanations can help you understand what will happen during the exam.  Electrodes. The neurologist or a technician places surface electrodes at various locations on your skin depending on where you're experiencing symptoms. Or the neurologist may insert needle electrodes at different sites depending on your symptoms.  Sensations. The electrodes will at times transmit a tiny electrical current that you may feel as a twinge or spasm. The needle electrode may cause discomfort or pain that usually ends shortly after the needle is removed. If you are concerned about discomfort or pain, you may want to talk to the neurologist about taking a short break during the exam.  Instructions. During the needle EMG, the neurologist will assess whether there is any spontaneous electrical activity when the muscle is at rest - activity that isn't present in healthy muscle tissue - and the degree of activity when you slightly contract the muscle.  He or she will give you instructions on resting and contracting a muscle at appropriate times. Depending on what muscles and nerves the neurologist is examining, he or she may ask you to change positions during the exam.  After your  EMG You may experience some temporary, minor bruising where the needle electrode was inserted into your muscle. This bruising should fade within several days. If it persists, contact your primary care doctor.

## 2022-10-01 NOTE — Progress Notes (Signed)
[facility] Clinic Note - Initial Visit   Date: 10/01/2022   Tricia [name] MRN: [id number] DOB: 02-22-1945   Dear Dr. de [place]:  Thank you for your kind referral of Tricia [name] for consultation of bilateral leg pain. Although her history is well known to you, please allow [place] to reiterate it for the purpose of our medical record. The patient was accompanied to the clinic by self.    Tricia [name] is a 77 y.o. right-handed female with COPD, prior tobacco use, hypertension, s/p cervical surgery, CAD s/p PCI, CKD, GERD, hyperlipidemia, and OSA presenting for evaluation of bilateral leg pain.   IMPRESSION/PLAN: Subacute onset of bilateral leg pain involving the lower legs into the feet.  Symptoms are more suggestive of lumbar radiculopathy, than neuropathy.  She has history of lumbar canal stenosis at L4-5.  Exam shows generalized weakness with give-way on exam limiting accurate assessment.  Reflexes and sensation is reduced in the legs, which may be due to overlapping neuropathy. To further investigate her symptoms, she will return for NCS/EMG of the legs.  For leg strengthening, she will be referred for out-patient PT.  Consider MRI lumbar spine, going forward.  Further recommendations pending results.  ------------------------------------------------------------- History of present illness: Starting around late July, she began having shooting pain down the legs and over the feet.  Symptoms are intermittent and occur mostly when she is sitting or standing.  Pain lasts 5-10 seconds and self-resolves.    She has mild low back pain.  She has some numbness in the legs.  She also complains of weakness and imbalance.  She has not had any falls.  She uses a cane and walker as needed.   She has history of lumbar spinal stenosis at L4-5 which is managed conservatively.  She takes hydrocodone for pain.  She was previously on gabapentin which was discontinued due to  CKD.  She lives in a one level home. Her son stays with her at times.    Out-side paper records, electronic medical record, and images have been reviewed where available and summarized as:  CT myelogram 02/11/2013: 1. Small central disc protrusion L2-3 without significant  compressive pathology.  2. Mild multifactorial spinal stenosis L3-4.  3. Advanced facet disease L4-5 allowing grade 1 anterolisthesis  without dynamic instability, and resulting in moderate central canal  stenosis.  4. Asymmetric facet degenerative change L5-S1, left worse than right.   Lab Results  Component Value Date   HGBA1C 6.8 (H) 06/24/2022   Lab Results  Component Value Date   VITAMINB12 534 06/10/2021   Lab Results  Component Value Date   TSH 3.830 03/18/2022   No results found for: "ESRSEDRATE", "POCTSEDRATE"  Past Medical History:  Diagnosis Date   Adenomatous polyp of colon 02/2004   Allergy 2000   Sulfur   Anemia    Anxiety    Arthritis    B12 deficiency    CAD (coronary artery disease)    a. s/p remote BMS to LAD;  b. 09/2015 Inf STEMI/VF Arrest: LM nl, LAD 85p (staged PCI 2 days later w/ 3.0x32 Synergy DES), 40p/m, 25d, LCX 45m, RCA 80ost/100p (2.75x32 Synergy DES), 72m, EF 55-65%. // c. Myoview 2/18: EF 59, no ischemia or infarction; Normal study // Myoview 3/22: EF 70, no ischemia or infarction; low risk   Chronic kidney disease, stage 3, mod decreased GFR (HCC) 03/09/2020   COPD (chronic obstructive pulmonary disease) (HCC) 2018   Chest X-ray   Dermatophytosis  of groin and perianal area    Diet Controlled Diabetes Mellitus    Diverticulosis    GERD (gastroesophageal reflux disease)    H/O echocardiogram    a. 09/2015 Echo: EF 60-65%, no rwma, mild AI, mildly dil RA, mild to mod TR.   History of echocardiogram    Echo 3/22: EF 60-65, no RWMA, GR 1 DD, normal RVSF, mild LAE, RVSP 35.5, mild to moderate TR, trivial AI   Hyperlipidemia    Hypertension    Hypertensive heart disease     Low back pain    l5 disc   Lumbar spinal stenosis 03/03/2019   Morbid obesity (HCC)    Myocardial infarction (HCC) 2001, 2017   Neuromuscular disorder (HCC)    Osteoporosis    PVC's (premature ventricular contractions) 04/16/2017   Holter 3/19: NSR, average heart rate 69, frequent PVCs (5% total beats), rare supraventricular ectopics, no AF/flutter   Sleep apnea 10/03/2009   Resolved after gastric bypass     TOBACCO ABUSE 10/05/2009   Qualifier: Diagnosis of  By: Jens Som, MD, Lyn Hollingshead    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 In setting of inferior STEMI.    Past Surgical History:  Procedure Laterality Date   ABDOMINAL HYSTERECTOMY     APPENDECTOMY     BARIATRIC SURGERY     CARDIAC CATHETERIZATION N/A 10/01/2015   Procedure: Left Heart Cath and Coronary Angiography;  Surgeon: Marykay Lex, MD;  Location: [facility] INVASIVE CV LAB;  Service: Cardiovascular;  Laterality: N/A;   CARDIAC CATHETERIZATION N/A 10/01/2015   Procedure: Coronary Stent Intervention;  Surgeon: Marykay Lex, MD;  Location: [facility] INVASIVE CV LAB;  Service: Cardiovascular;  Laterality: N/A;   CARDIAC CATHETERIZATION N/A 10/03/2015   Procedure: Coronary Stent Intervention;  Surgeon: Lennette Bihari, MD;  Location: [facility] INVASIVE CV LAB;  Service: Cardiovascular;  Laterality: N/A;   CARDIAC CATHETERIZATION N/A 10/03/2015   Procedure: Coronary/Graft Angiography;  Surgeon: Lennette Bihari, MD;  Location: [facility] INVASIVE CV LAB;  Service: Cardiovascular;  Laterality: N/A;   CARPAL TUNNEL RELEASE Bilateral    CERVICAL DISC SURGERY     CHOLECYSTECTOMY     CORONARY ANGIOPLASTY  2002   EYE SURGERY  2020 January   Cataracts   HERNIA REPAIR     JOINT REPLACEMENT  2003   Knee   LUMBAR LAMINECTOMY     L3-4   NEPHRECTOMY Right    Partial   SPINE SURGERY     C1   TONSILLECTOMY     TOTAL KNEE ARTHROPLASTY Left    TUBAL LIGATION     ULNAR NERVE REPAIR Left      Medications:  Outpatient Encounter  Medications as of 10/01/2022  Medication Sig   amLODipine (NORVASC) 10 MG tablet Take 1 tablet (10 mg total) by mouth daily.   aspirin EC 81 MG tablet Take 81 mg by mouth daily.   Blood Glucose Monitoring Suppl DEVI 1 each by Does not apply route in the morning, at noon, and at bedtime. May substitute to any manufacturer covered by patient's insurance. Patient states they use One Touch Verio Reflect.   carvedilol (COREG) 6.25 MG tablet Take 1 tablet (6.25 mg total) by mouth 2 (two) times daily with a meal.   Cholecalciferol (VITAMIN D) 50 MCG (2000 UT) tablet Take 1 tablet (2,000 Units total) by mouth daily.   clopidogrel (PLAVIX) 75 MG tablet Take 1 tablet (75 mg total) by mouth daily.   Continuous Glucose Sensor (FREESTYLE LIBRE 3  SENSOR) MISC 1 Units by Does not apply route every 14 (fourteen) days.   dapagliflozin propanediol (FARXIGA) 10 MG TABS tablet Take 1 tablet (10 mg total) by mouth daily.   ezetimibe-simvastatin (VYTORIN) 10-80 MG tablet Take 1 tablet by mouth at bedtime.   furosemide (LASIX) 20 MG tablet Take 1-2 tablets (20-40 mg total) by mouth as needed for fluid or edema.   Glucose Blood (BLOOD GLUCOSE TEST STRIPS) STRP 1 each by In Vitro route in the morning, at noon, and at bedtime. May substitute to any manufacturer covered by patient's insurance.   HYDROcodone-acetaminophen (NORCO) 10-325 MG tablet Take 1 tablet by mouth daily as needed.   Lancet Device MISC 1 each by Does not apply route in the morning, at noon, and at bedtime. May substitute to any manufacturer covered by patient's insurance.   Lancets Misc. MISC 1 each by Does not apply route in the morning, at noon, and at bedtime. May substitute to any manufacturer covered by patient's insurance.   nitroGLYCERIN (NITROLINGUAL) 0.4 MG/SPRAY spray Place 1 spray under the tongue every five minutes for 3 doses as needed for chest pain.   pantoprazole (PROTONIX) 40 MG tablet Take 1 tablet (40 mg total) by mouth 2 (two) times  daily before a meal.   potassium chloride (KLOR-CON) 10 MEQ tablet TAKE ONE TABLET BY MOUTH WHEN YOU TAKE YOUR FUROSEMIDE FOR SWELLING.   PROAIR HFA 108 (90 Base) MCG/ACT inhaler Inhale 2 puffs into the lungs every 6 (six) hours as needed for wheezing or shortness of breath.   Semaglutide,0.25 or 0.5MG /DOS, (OZEMPIC, 0.25 OR 0.5 MG/DOSE,) 2 MG/3ML SOPN Inject 0.5 mg into the skin once a week.   valsartan (DIOVAN) 320 MG tablet Take 1 tablet (320 mg total) by mouth daily.   No facility-administered encounter medications on file as of 10/01/2022.    Allergies:  Allergies  Allergen Reactions   Sulfa Antibiotics Diarrhea and Nausea And Vomiting    Family History: Family History  Problem Relation Age of Onset   Colitis Mother        sepsis from c dif colitis   Cancer Mother    COPD Mother    Diabetes Mother    Heart disease Mother    Hyperlipidemia Mother    Hypertension Mother    Heart disease Father    Early death Father    Lung cancer Maternal Uncle    Cancer Maternal Uncle    Stomach cancer Maternal Aunt    Lung cancer Maternal Uncle    Cancer Maternal Grandmother    Hearing loss Paternal Grandfather    Hyperlipidemia Paternal Grandfather    Hypertension Paternal Grandfather    Stroke Paternal Grandfather    Cancer Paternal Grandmother    Cancer Maternal Aunt    Cancer Maternal Uncle    Cancer Brother    Diabetes Sister    Early death Sister    Learning disabilities Sister    Learning disabilities Daughter    Vision loss Maternal Aunt    Colon cancer Neg Hx    Esophageal cancer Neg Hx    Rectal cancer Neg Hx     Social History: Social History   Tobacco Use   Smoking status: Former    Current packs/day: 0.00    Average packs/day: 2.0 packs/day for 41.0 years (82.0 ttl pk-yrs)    Types: Cigarettes    Start date: 08/04/1974    Quit date: 08/04/2015    Years since quitting: 7.1   Smokeless tobacco: Never  Tobacco comments:    Quit 2000 started again 2008, quit  again 2011, started again 2014  Vaping Use   Vaping status: Never Used  Substance Use Topics   Alcohol use: No   Drug use: No   Social History   Social History Narrative   Widowed in 04/2012. 2 children. 3 grandkids. Lives alone. Completely indendent. Disabled/retired. Disabled-lifted computer paper. Hobbies: spend money-gamble.         Are you right handed or left handed? Right Handed    Are you currently employed ? No    What is your current occupation?   Do you live at home alone? Yes   Who lives with you?    What type of home do you live in: 1 story or 2 story? Lives in a one story home         Vital Signs:  BP 125/64   Pulse 68   Ht 5\' 1"  (1.549 m)   Wt 220 lb (99.8 kg)   LMP  (LMP Unknown)   SpO2 97%   BMI 41.57 kg/m   Neurological Exam: MENTAL STATUS including orientation to time, place, person, recent and remote memory, attention span and concentration, language, and fund of knowledge is normal.  Speech is not dysarthric.  CRANIAL NERVES: II:  No visual field defects.     III-IV-VI: Pupils equal round and reactive to light.  Normal conjugate, extra-ocular eye movements in all directions of gaze.  No nystagmus.  No ptosis.   V:  Normal facial sensation.    VII:  Normal facial symmetry and movements.   VIII:  Normal hearing and vestibular function.   IX-X:  Normal palatal movement.   XI:  Normal shoulder shrug and head rotation.   XII:  Normal tongue strength and range of motion, no deviation or fasciculation.  MOTOR:  There is give-way weakness throughout.  Proximal arm and leg strength is 4/5 with give-way.  Distal strength is 5/5.  No atrophy, fasciculations or abnormal movements.  No pronator drift.   MSRs:                                           Right        Left brachioradialis 2+  2+  biceps 2+  2+  triceps 2+  2+  patellar 0  0  ankle jerk 0  0  Hoffman no  no  plantar response down  down   SENSORY:  Vibration diminished at the knees and absent  below the ankles.  Temperature reduced over the feet.  Pin prick intact throughout.  Romberg's sign absent.   COORDINATION/GAIT: Normal finger-to- nose-finger.  Intact rapid alternating movements bilaterally.  Gait is wide-based, slow, unassisted, stable.    Thank you for allowing me to participate in patient's care.  If I can answer any additional questions, I would be pleased to do so.    Sincerely,    Addalynne Golding K. Allena Katz, DO

## 2022-10-11 ENCOUNTER — Encounter (HOSPITAL_BASED_OUTPATIENT_CLINIC_OR_DEPARTMENT_OTHER): Payer: Self-pay | Admitting: Family Medicine

## 2022-10-13 ENCOUNTER — Other Ambulatory Visit (HOSPITAL_BASED_OUTPATIENT_CLINIC_OR_DEPARTMENT_OTHER): Payer: Self-pay

## 2022-10-13 DIAGNOSIS — E1169 Type 2 diabetes mellitus with other specified complication: Secondary | ICD-10-CM

## 2022-10-13 DIAGNOSIS — J449 Chronic obstructive pulmonary disease, unspecified: Secondary | ICD-10-CM

## 2022-10-13 DIAGNOSIS — I2510 Atherosclerotic heart disease of native coronary artery without angina pectoris: Secondary | ICD-10-CM

## 2022-10-13 DIAGNOSIS — E1159 Type 2 diabetes mellitus with other circulatory complications: Secondary | ICD-10-CM

## 2022-10-13 DIAGNOSIS — N183 Chronic kidney disease, stage 3 unspecified: Secondary | ICD-10-CM

## 2022-10-13 DIAGNOSIS — E1121 Type 2 diabetes mellitus with diabetic nephropathy: Secondary | ICD-10-CM

## 2022-10-13 MED ORDER — FREESTYLE LIBRE 3 SENSOR MISC
1.0000 [IU] | 11 refills | Status: DC
Start: 2022-10-13 — End: 2022-12-12

## 2022-10-28 ENCOUNTER — Other Ambulatory Visit (HOSPITAL_BASED_OUTPATIENT_CLINIC_OR_DEPARTMENT_OTHER): Payer: Self-pay | Admitting: *Deleted

## 2022-10-28 DIAGNOSIS — E1159 Type 2 diabetes mellitus with other circulatory complications: Secondary | ICD-10-CM

## 2022-10-28 DIAGNOSIS — K219 Gastro-esophageal reflux disease without esophagitis: Secondary | ICD-10-CM

## 2022-10-28 DIAGNOSIS — E1169 Type 2 diabetes mellitus with other specified complication: Secondary | ICD-10-CM

## 2022-10-28 MED ORDER — EZETIMIBE-SIMVASTATIN 10-80 MG PO TABS
1.0000 | ORAL_TABLET | Freq: Every day | ORAL | 3 refills | Status: DC
Start: 2022-10-28 — End: 2023-11-16
  Filled 2023-03-16 – 2023-03-19 (×2): qty 90, 90d supply, fill #0
  Filled 2023-03-23: qty 30, 30d supply, fill #0
  Filled 2023-04-06 – 2023-04-15 (×2): qty 30, 30d supply, fill #1
  Filled 2023-04-24 – 2023-05-12 (×2): qty 30, 30d supply, fill #2
  Filled 2023-05-26 – 2023-06-09 (×2): qty 30, 30d supply, fill #3
  Filled 2023-06-26 – 2023-07-28 (×2): qty 30, 30d supply, fill #4
  Filled 2023-08-20 – 2023-08-25 (×2): qty 30, 30d supply, fill #5
  Filled 2023-09-18 – 2023-09-22 (×2): qty 30, 30d supply, fill #6
  Filled 2023-10-22: qty 30, 30d supply, fill #7

## 2022-10-28 MED ORDER — PANTOPRAZOLE SODIUM 40 MG PO TBEC
40.0000 mg | DELAYED_RELEASE_TABLET | Freq: Two times a day (BID) | ORAL | 11 refills | Status: DC
Start: 2022-10-28 — End: 2023-11-16
  Filled 2023-03-16 – 2023-03-23 (×3): qty 60, 30d supply, fill #0
  Filled 2023-04-06 – 2023-04-15 (×2): qty 60, 30d supply, fill #1
  Filled 2023-04-24 – 2023-05-12 (×2): qty 60, 30d supply, fill #2
  Filled 2023-05-26 – 2023-06-09 (×2): qty 60, 30d supply, fill #3
  Filled 2023-06-26 – 2023-07-28 (×2): qty 60, 30d supply, fill #4
  Filled 2023-08-20 – 2023-08-25 (×2): qty 60, 30d supply, fill #5
  Filled 2023-09-18 – 2023-09-22 (×2): qty 60, 30d supply, fill #6
  Filled 2023-10-22: qty 60, 30d supply, fill #7

## 2022-10-30 ENCOUNTER — Other Ambulatory Visit: Payer: Self-pay

## 2022-10-30 ENCOUNTER — Other Ambulatory Visit (HOSPITAL_BASED_OUTPATIENT_CLINIC_OR_DEPARTMENT_OTHER): Payer: Self-pay

## 2022-10-30 ENCOUNTER — Ambulatory Visit (HOSPITAL_BASED_OUTPATIENT_CLINIC_OR_DEPARTMENT_OTHER): Payer: PPO | Attending: Neurology | Admitting: Physical Therapy

## 2022-10-30 ENCOUNTER — Encounter (HOSPITAL_BASED_OUTPATIENT_CLINIC_OR_DEPARTMENT_OTHER): Payer: Self-pay | Admitting: Physical Therapy

## 2022-10-30 ENCOUNTER — Ambulatory Visit: Payer: PPO | Admitting: Neurology

## 2022-10-30 DIAGNOSIS — M5459 Other low back pain: Secondary | ICD-10-CM | POA: Insufficient documentation

## 2022-10-30 DIAGNOSIS — M546 Pain in thoracic spine: Secondary | ICD-10-CM | POA: Insufficient documentation

## 2022-10-30 DIAGNOSIS — R262 Difficulty in walking, not elsewhere classified: Secondary | ICD-10-CM | POA: Diagnosis not present

## 2022-10-30 DIAGNOSIS — E1142 Type 2 diabetes mellitus with diabetic polyneuropathy: Secondary | ICD-10-CM

## 2022-10-30 DIAGNOSIS — M6281 Muscle weakness (generalized): Secondary | ICD-10-CM | POA: Diagnosis not present

## 2022-10-30 DIAGNOSIS — M79604 Pain in right leg: Secondary | ICD-10-CM | POA: Insufficient documentation

## 2022-10-30 DIAGNOSIS — M48061 Spinal stenosis, lumbar region without neurogenic claudication: Secondary | ICD-10-CM | POA: Diagnosis not present

## 2022-10-30 DIAGNOSIS — M79605 Pain in left leg: Secondary | ICD-10-CM | POA: Insufficient documentation

## 2022-10-30 MED ORDER — INFLUENZA VAC A&B SURF ANT ADJ 0.5 ML IM SUSY
0.5000 mL | PREFILLED_SYRINGE | Freq: Once | INTRAMUSCULAR | 0 refills | Status: AC
  Filled 2022-10-30: qty 0.5, 1d supply, fill #0

## 2022-10-30 MED ORDER — COMIRNATY 30 MCG/0.3ML IM SUSY
0.3000 mL | PREFILLED_SYRINGE | Freq: Once | INTRAMUSCULAR | 0 refills | Status: AC
  Filled 2022-10-30: qty 0.3, 1d supply, fill #0

## 2022-10-30 NOTE — Therapy (Signed)
OUTPATIENT PHYSICAL THERAPY THORACOLUMBAR EVALUATION   Patient Name: Tricia [name] MRN: [id number] DOB:1945/12/10, 77 y.o., female Today's Date: 10/31/2022  END OF SESSION:  PT End of Session - 10/30/22 1503     Visit Number 1    Number of Visits 10    Date for PT Re-Evaluation 12/11/22    Authorization Type HTA    PT Start Time 1409    PT Stop Time 1457    PT Time Calculation (min) 48 min    Activity Tolerance Patient tolerated treatment well    Behavior During Therapy [facility] for tasks assessed/performed             Past Medical History:  Diagnosis Date   Adenomatous polyp of colon 02/2004   Allergy 2000   Sulfur   Anemia    Anxiety    Arthritis    B12 deficiency    CAD (coronary artery disease)    a. s/p remote BMS to LAD;  b. 09/2015 Inf STEMI/VF Arrest: LM nl, LAD 85p (staged PCI 2 days later w/ 3.0x32 Synergy DES), 40p/m, 25d, LCX 32m, RCA 80ost/100p (2.75x32 Synergy DES), 24m, EF 55-65%. // c. Myoview 2/18: EF 59, no ischemia or infarction; Normal study // Myoview 3/22: EF 70, no ischemia or infarction; low risk   Chronic kidney disease, stage 3, mod decreased GFR (HCC) 03/09/2020   COPD (chronic obstructive pulmonary disease) (HCC) 2018   Chest X-ray   Dermatophytosis of groin and perianal area    Diet Controlled Diabetes Mellitus    Diverticulosis    GERD (gastroesophageal reflux disease)    H/O echocardiogram    a. 09/2015 Echo: EF 60-65%, no rwma, mild AI, mildly dil RA, mild to mod TR.   History of echocardiogram    Echo 3/22: EF 60-65, no RWMA, GR 1 DD, normal RVSF, mild LAE, RVSP 35.5, mild to moderate TR, trivial AI   Hyperlipidemia    Hypertension    Hypertensive heart disease    Low back pain    l5 disc   Lumbar spinal stenosis 03/03/2019   Morbid obesity (HCC)    Myocardial infarction (HCC) 2001, 2017   Neuromuscular disorder (HCC)    Osteoporosis    PVC's (premature ventricular contractions) 04/16/2017   Holter 3/19: NSR, average heart rate 69,  frequent PVCs (5% total beats), rare supraventricular ectopics, no AF/flutter   Sleep apnea 10/03/2009   Resolved after gastric bypass     TOBACCO ABUSE 10/05/2009   Qualifier: Diagnosis of  By: Jens Som, MD, Lyn Hollingshead    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 In setting of inferior STEMI.   Past Surgical History:  Procedure Laterality Date   ABDOMINAL HYSTERECTOMY     APPENDECTOMY     BARIATRIC SURGERY     CARDIAC CATHETERIZATION N/A 10/01/2015   Procedure: Left Heart Cath and Coronary Angiography;  Surgeon: Marykay Lex, MD;  Location: [facility] INVASIVE CV LAB;  Service: Cardiovascular;  Laterality: N/A;   CARDIAC CATHETERIZATION N/A 10/01/2015   Procedure: Coronary Stent Intervention;  Surgeon: Marykay Lex, MD;  Location: [facility] INVASIVE CV LAB;  Service: Cardiovascular;  Laterality: N/A;   CARDIAC CATHETERIZATION N/A 10/03/2015   Procedure: Coronary Stent Intervention;  Surgeon: Lennette Bihari, MD;  Location: [facility] INVASIVE CV LAB;  Service: Cardiovascular;  Laterality: N/A;   CARDIAC CATHETERIZATION N/A 10/03/2015   Procedure: Coronary/Graft Angiography;  Surgeon: Lennette Bihari, MD;  Location: [facility] INVASIVE CV LAB;  Service: Cardiovascular;  Laterality: N/A;  CARPAL TUNNEL RELEASE Bilateral    CERVICAL DISC SURGERY     CHOLECYSTECTOMY     CORONARY ANGIOPLASTY  2002   EYE SURGERY  2020 January   Cataracts   HERNIA REPAIR     JOINT REPLACEMENT  2003   Knee   LUMBAR LAMINECTOMY     L3-4   NEPHRECTOMY Right    Partial   SPINE SURGERY     C1   TONSILLECTOMY     TOTAL KNEE ARTHROPLASTY Left    TUBAL LIGATION     ULNAR NERVE REPAIR Left    Patient Active Problem List   Diagnosis Date Noted   Lower extremity pain 09/01/2022   Dysuria 04/11/2022   Shortness of breath 06/10/2021   Chronic scapular pain 12/11/2020   Poor balance 09/12/2020   Diabetes mellitus (HCC) 08/22/2020   Chronic kidney disease, stage 3, mod decreased GFR (HCC) 03/09/2020   History of  partial nephrectomy 09/08/2019   Laryngopharyngeal reflux (LPR) 09/06/2019   Lumbar spinal stenosis 03/03/2019   Lower extremity edema 11/21/2018   PVC's (premature ventricular contractions) 04/16/2017   Hyperparathyroidism, secondary (HCC) 07/06/2016   COPD (chronic obstructive pulmonary disease) (HCC) 04/19/2016   Coronary artery disease involving native coronary artery of native heart without angina pectoris 03/10/2016   History of cardiac arrest 10/01/2015   History of ST elevation myocardial infarction (STEMI) 10/01/2015   GERD (gastroesophageal reflux disease), Rx Protonix 02/14/2014   Hx of gastric bypass 02/14/2014   Former smoker, 50 pack years, quit 2010 02/14/2014   Anemia, B12 deficiency 04/04/2009   Osteoporosis 12/14/2006   Hyperlipidemia associated with type 2 diabetes mellitus (HCC) 09/22/2006   Hypertension associated with diabetes (HCC) 09/22/2006   Osteoarthritis, multiple sites 09/22/2006   Degenerative joint disease (DJD) of lumbar spine 09/22/2006   Adenomatous polyp of colon 02/04/2004     REFERRING PROVIDER: Glendale Chard, DO  REFERRING DIAG: M79.604,M79.605 (ICD-10-CM) - Bilateral leg pain             M48.061 (ICD-10-CM) - Spinal stenosis of lumbar region without neurogenic claudication  Rationale for Evaluation and Treatment: Rehabilitation  THERAPY DIAG:  Muscle weakness (generalized)  Pain in thoracic spine  Pain in right leg  Pain in left leg  Difficulty in walking, not elsewhere classified  Other low back pain  ONSET DATE: Chronic back pain and leg weakness ; July pain in lower extremities   SUBJECTIVE:  SUBJECTIVE STATEMENT: Pt states she has had back pain for years and leg weakness for at least 3-5 years.  Pt began having shooting pain down les in late  July.  Sx's occur mostly when sitting or standing.  PT order indicated Leg Strengthening She has history of lumbar canal stenosis at L4-5.She has history of lumbar spinal stenosis at L4-5    Pt states neurosurgeon has told her she needed surgery for years.   Pt has a quad cane and rollator.  She prefers the rollator over the cane, but doesn't always use an AD.  Pt pushes a cart with grocery shopping.  She has increased pain with grocery shopping and has difficulty straightening up.  Pt states she leans forward when having pain with ambulation.  Pt states she has shooting pain "whenever it decides to come".  Pt can have shooting pains down leg in sitting, standing, or lying.  Worst pain is with carrying/lifting objects including carrying groceries.  Pt is limited with standing duration which impairs her ability to cook.  Pt had PT approx 4 years ago and reports it didn't hlep.    PERTINENT HISTORY:  Per dx.  spondylolisthesis at L4-5 Osteoporosis, COPD, HTN, CAD, CKD, arthritis, MI in 2001 and 2017 Cervical surgery including on C1, gastric bypass surgery, L TKR, partial nephrectomy, Bladder incontinence--wear pads  PAIN:  Pt denies lumbar pain currently.  0/10 Current, 10/10 worst  Pain in mid to low thoracic.  2/10 current, 10-12/10 worst, 0/10 best  Pt reports having intermittent shooting pain in bilat LE's and feet.  Pt has N/T in bilat feet.   PRECAUTIONS: Other: osteoporosis, spondylolisthesis, L TKR, cervical surgery   WEIGHT BEARING RESTRICTIONS: No  FALLS:  Has patient fallen in last 6 months? No  LIVING ENVIRONMENT: Lives with: lives alone Lives in: 1 story home Has following equipment at home: rollator, quad cane   PLOF: IndependentPt has had chronic back pain and leg weakness which has affected her daily activities and functional mobility.   PATIENT GOALS: stand for 1 hour, to be pain free    OBJECTIVE:   DIAGNOSTIC FINDINGS:  CT myelogram 02/11/2013: 1. Small  central disc protrusion L2-3 without significant  compressive pathology.  2. Mild multifactorial spinal stenosis L3-4.  3. Advanced facet disease L4-5 allowing grade 1 anterolisthesis  without dynamic instability, and resulting in moderate central canal  stenosis.  4. Asymmetric facet degenerative change L5-S1, left worse than right.   Lumbar x ray in 2019: IMPRESSION: Stable spondylolisthesis at L4-5. No fracture. Lower lumbar scoliosis, stable. Mild disc space narrowing L4-5.   There is aortic atherosclerosis.  PATIENT SURVEYS:  FOTO 40 with a goal of 48 at visit 12  SCREENING FOR RED FLAGS: Bowel or bladder incontinence: Yes: bladder wear pads Spinal tumors: No Cauda equina syndrome: No Compression fracture: No Abdominal aneurysm: No  COGNITION: Overall cognitive status: Within functional limits for tasks assessed     SENSATION: Bilat LE dermatomes 2+ t/o LT except L4 on R LE 1+ and L5 on L LE 1+      LOWER EXTREMITY MMT:    MMT Right eval Left eval  Hip flexion Tolerates slight resistance Tolerates min resistance  Hip extension    Hip abduction 16.1 18.2  Hip adduction    Hip internal rotation    Hip external rotation    Knee flexion Unable to tolerate resistance (seated) 4+/5 (seated)  Knee extension 4-/5 4/5  Ankle dorsiflexion 4/5 5/5  Ankle plantarflexion WFL seated  WFL seated  Ankle inversion    Ankle eversion     (Blank rows = not tested)   GAIT:  Comments: slow gait speed.  Bilat toe out.  Pt ambulated without AD and had a LOB when turning and used the wall for LOB.   TODAY'S TREATMENT:                                                                                                                                See below for pt education  PATIENT EDUCATION:  Education details: PT used a spinal model to help educate pt concerning dx and relevant anatomy.  POC, rationale of interventions, and objective findings.  Person educated:  Patient Education method: Explanation Education comprehension: verbalized understanding and needs further education  HOME EXERCISE PROGRAM: Will give next visit  ASSESSMENT:  CLINICAL IMPRESSION: Patient is a 77 y.o. female with dx's of bilateral leg pain and spinal stenosis of lumbar region without neurogenic claudication.  Pt has had back pain for years and leg weakness for at least 3-5 years though began having shooting pain down legs in late July.  Pt has increased pain with ambulation, sitting, and standing.  She is limited with standing duration due to pain which impairs her ability to perform household chores including cooking.  Her worst pain is with carrying/lifting objects including carrying groceries.  She has increased pain with grocery shopping and has difficulty straightening up.  She has weakness in bilat LE's.  Pt should benefit from skilled PT services to address impairments and to improve overall function.     OBJECTIVE IMPAIRMENTS: Abnormal gait, decreased activity tolerance, decreased endurance, decreased mobility, difficulty walking, decreased strength, and pain.   ACTIVITY LIMITATIONS: carrying, lifting, sitting, standing, and locomotion level  PARTICIPATION LIMITATIONS: meal prep, cleaning, shopping, and community activity  PERSONAL FACTORS: Time since onset of injury/illness/exacerbation and 3+ comorbidities: Osteoporosis, COPD, CKD, arthritis, L TKR, cervical surgery, MI in 2001 and 2017  are also affecting patient's functional outcome.   REHAB POTENTIAL: Good  CLINICAL DECISION MAKING: Evolving/moderate complexity  EVALUATION COMPLEXITY: Moderate   GOALS:  SHORT TERM GOALS: Target date: 11/20/2022   Pt will be independent and compliant with HEP for improved pain, strength, tolerance to activity, and function.  Baseline: Goal status: INITIAL  2.  Pt will report at least a 25% improvement in pain and sx's overall.  Baseline:  Goal status: INITIAL  3.   Pt will report improved tolerance with standing including increased time standing to cook with reduced pain.  Baseline:  Goal status: INITIAL   LONG TERM GOALS: Target date: 12/11/2022  Pt will ambulate in grocery store without significant pain and improved posture.  Baseline:  Goal status: INITIAL  2.  Pt will demo improved LE strength to at least 4/5 in bilat hip flexion, 5/5 in bilat knee exte, 4/5 in R knee flexion and 25# in bilat hip abduction for improved tolerance with and  performance of functional mobility.  Baseline:  Goal status: INITIAL  3.  Pt will report at least a 70% improvement in pain with cooking and carrying groceries.   Baseline:  Goal status: INITIAL  4.  Pt will report improved stability with gait including turning.  Baseline:  Goal status: INITIAL     PLAN:  PT FREQUENCY: 1-2x/week  PT DURATION: 6 weeks  PLANNED INTERVENTIONS: Therapeutic exercises, Therapeutic activity, Neuromuscular re-education, Balance training, Gait training, Patient/Family education, Self Care, Stair training, DME instructions, Aquatic Therapy, Dry Needling, Cryotherapy, Moist heat, Vasopneumatic device, Ultrasound, Manual therapy, and Re-evaluation.  PLAN FOR NEXT SESSION: core and LE strengthening.  Gait and balance training.  Establish HEP next visit.     Audie Clear III PT, DPT 10/31/22 6:04 PM

## 2022-10-30 NOTE — Procedures (Signed)
[facility]  [address] [place], [address]  [place], [place] [address] Tel: [phone] Fax: [phone] Test Date:  10/30/2022  Patient: Tricia [name] DOB: 1945/02/15 Physician: Nita Sickle, DO  Sex: Female Height: 5\' 1"  Ref Phys: Nita Sickle, DO  ID#: [id number]   Technician:    History: This is a 77 year old female referred for evaluation of bilateral leg paresthesias and pain.  NCV & EMG Findings: Extensive electrodiagnostic testing of the right lower extremity and additional studies of the left shows:  Bilateral sural and superficial peroneal sensory responses are absent. Bilateral peroneal (EDB) and tibial motor responses are absent.  Bilateral peroneal motor responses at the tibialis anterior are within normal limits. Tibial H reflex study on the right is absent, and prolonged on the left.   Chronic motor axon loss changes are seen affecting bilateral medial gastrocnemius, flexor digitorum longus, rectus femoris, right biceps femoris short head, and right adductor longus muscles.  There is no evidence of accompanying active denervation.    Impression: The electrophysiologic findings are consistent with a chronic and symmetric sensorimotor axonal polyneuropathy affecting the lower extremities. There is also evidence of an overlapping L3, L4, and S1 radiculopathy affecting bilateral lower extremities.   ___________________________ Nita Sickle, DO    Nerve Conduction Studies   Stim Site NR Peak (ms) Norm Peak (ms) O-P Amp (V) Norm O-P Amp  Left Sup Peroneal Anti Sensory (Ant Lat Mall)  32 C  12 cm *NR  <4.6  >3  Right Sup Peroneal Anti Sensory (Ant Lat Mall)  32 C  12 cm *NR  <4.6  >3  Left Sural Anti Sensory (Lat Mall)  32 C  Calf *NR  <4.6  >3  Right Sural Anti Sensory (Lat Mall)  32 C  Calf *NR  <4.6  >3     Stim Site NR Onset (ms) Norm Onset (ms) O-P Amp (mV) Norm O-P Amp Site1 Site2 Delta-0 (ms) Dist (cm) Vel (m/s) Norm Vel (m/s)  Left Peroneal  Motor (Ext Dig Brev)  32 C  Ankle *NR  <6.0  >2.5 B Fib Ankle  0.0  >40  B Fib *NR     Poplt B Fib  0.0  >40  Poplt *NR            Right Peroneal Motor (Ext Dig Brev)  32 C  Ankle *NR  <6.0  >2.5 B Fib Ankle  0.0  >40  B Fib *NR     Poplt B Fib  0.0  >40  Poplt *NR            Left Peroneal TA Motor (Tib Ant)  32 C  Fib Head    3.0 <4.5 3.7 >3 Poplit Fib Head 1.4 8.0 57 >40  Poplit    4.4 <5.7 3.0         Right Peroneal TA Motor (Tib Ant)  32 C  Fib Head    3.5 <4.5 3.1 >3 Poplit Fib Head 1.3 8.0 62 >40  Poplit    4.8 <5.7 2.9         Left Tibial Motor (Abd Hall Brev)  32 C  Ankle *NR  <6.0  >4 Knee Ankle  0.0  >40  Knee *NR            Right Tibial Motor (Abd Hall Brev)  32 C  Ankle *NR  <6.0  >4 Knee Ankle  0.0  >40  Knee *NR  Electromyography   Side Muscle Ins.Act Fibs Fasc Recrt Amp Dur Poly Activation Comment  Right AntTibialis Nml Nml Nml Nml Nml Nml Nml Nml N/A  Right Gastroc Nml Nml Nml *1- *1+ *1+ *1+ Nml N/A  Right Flex Dig Long Nml Nml Nml *2- *1+ *1+ *1+ Nml N/A  Right RectFemoris Nml Nml Nml *1- *1+ *1+ *1+ Nml N/A  Right BicepsFemS Nml Nml Nml *1- *1+ *1+ *1+ Nml N/A  Right GluteusMed Nml Nml Nml Nml Nml Nml Nml Nml N/A  Right AdductorLong Nml Nml Nml *1- *1+ *1+ *1+ Nml N/A  Left AntTibialis Nml Nml Nml Nml Nml Nml Nml Nml N/A  Left Gastroc Nml Nml Nml *1- *1+ *1+ *1+ Nml N/A  Left RectFemoris Nml Nml Nml *1- *1+ *1+ *1+ Nml N/A  Left GluteusMed Nml Nml Nml Nml Nml Nml Nml Nml N/A  Left Flex Dig Long Nml Nml Nml *2- *1+ *1+ *1+ Nml N/A      Waveforms:

## 2022-11-03 ENCOUNTER — Ambulatory Visit (HOSPITAL_BASED_OUTPATIENT_CLINIC_OR_DEPARTMENT_OTHER): Payer: PPO | Admitting: Physical Therapy

## 2022-11-03 DIAGNOSIS — M5459 Other low back pain: Secondary | ICD-10-CM

## 2022-11-03 DIAGNOSIS — R262 Difficulty in walking, not elsewhere classified: Secondary | ICD-10-CM

## 2022-11-03 DIAGNOSIS — M546 Pain in thoracic spine: Secondary | ICD-10-CM

## 2022-11-03 DIAGNOSIS — M79605 Pain in left leg: Secondary | ICD-10-CM

## 2022-11-03 DIAGNOSIS — M6281 Muscle weakness (generalized): Secondary | ICD-10-CM | POA: Diagnosis not present

## 2022-11-03 DIAGNOSIS — M79604 Pain in right leg: Secondary | ICD-10-CM

## 2022-11-03 NOTE — Therapy (Signed)
OUTPATIENT PHYSICAL THERAPY THORACOLUMBAR TREATMENT   Patient Name: Tricia [name] MRN: [id number] DOB:11-06-1945, 77 y.o., female Today's Date: 11/03/2022  END OF SESSION:  PT End of Session - 11/03/22 0833     Visit Number 2    Number of Visits 10    Date for PT Re-Evaluation 12/11/22    Authorization Type HTA    PT Start Time 0816    PT Stop Time 0855    PT Time Calculation (min) 39 min    Behavior During Therapy [facility] for tasks assessed/performed             Past Medical History:  Diagnosis Date   Adenomatous polyp of colon 02/2004   Allergy 2000   Sulfur   Anemia    Anxiety    Arthritis    B12 deficiency    CAD (coronary artery disease)    a. s/p remote BMS to LAD;  b. 09/2015 Inf STEMI/VF Arrest: LM nl, LAD 85p (staged PCI 2 days later w/ 3.0x32 Synergy DES), 40p/m, 25d, LCX 63m, RCA 80ost/100p (2.75x32 Synergy DES), 18m, EF 55-65%. // c. Myoview 2/18: EF 59, no ischemia or infarction; Normal study // Myoview 3/22: EF 70, no ischemia or infarction; low risk   Chronic kidney disease, stage 3, mod decreased GFR (HCC) 03/09/2020   COPD (chronic obstructive pulmonary disease) (HCC) 2018   Chest X-ray   Dermatophytosis of groin and perianal area    Diet Controlled Diabetes Mellitus    Diverticulosis    GERD (gastroesophageal reflux disease)    H/O echocardiogram    a. 09/2015 Echo: EF 60-65%, no rwma, mild AI, mildly dil RA, mild to mod TR.   History of echocardiogram    Echo 3/22: EF 60-65, no RWMA, GR 1 DD, normal RVSF, mild LAE, RVSP 35.5, mild to moderate TR, trivial AI   Hyperlipidemia    Hypertension    Hypertensive heart disease    Low back pain    l5 disc   Lumbar spinal stenosis 03/03/2019   Morbid obesity (HCC)    Myocardial infarction (HCC) 2001, 2017   Neuromuscular disorder (HCC)    Osteoporosis    PVC's (premature ventricular contractions) 04/16/2017   Holter 3/19: NSR, average heart rate 69, frequent PVCs (5% total beats), rare supraventricular  ectopics, no AF/flutter   Sleep apnea 10/03/2009   Resolved after gastric bypass     TOBACCO ABUSE 10/05/2009   Qualifier: Diagnosis of  By: Jens Som, MD, Lyn Hollingshead    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 In setting of inferior STEMI.   Past Surgical History:  Procedure Laterality Date   ABDOMINAL HYSTERECTOMY     APPENDECTOMY     BARIATRIC SURGERY     CARDIAC CATHETERIZATION N/A 10/01/2015   Procedure: Left Heart Cath and Coronary Angiography;  Surgeon: Marykay Lex, MD;  Location: [facility] INVASIVE CV LAB;  Service: Cardiovascular;  Laterality: N/A;   CARDIAC CATHETERIZATION N/A 10/01/2015   Procedure: Coronary Stent Intervention;  Surgeon: Marykay Lex, MD;  Location: [facility] INVASIVE CV LAB;  Service: Cardiovascular;  Laterality: N/A;   CARDIAC CATHETERIZATION N/A 10/03/2015   Procedure: Coronary Stent Intervention;  Surgeon: Lennette Bihari, MD;  Location: [facility] INVASIVE CV LAB;  Service: Cardiovascular;  Laterality: N/A;   CARDIAC CATHETERIZATION N/A 10/03/2015   Procedure: Coronary/Graft Angiography;  Surgeon: Lennette Bihari, MD;  Location: [facility] INVASIVE CV LAB;  Service: Cardiovascular;  Laterality: N/A;   CARPAL TUNNEL RELEASE Bilateral    CERVICAL DISC  SURGERY     CHOLECYSTECTOMY     CORONARY ANGIOPLASTY  2002   EYE SURGERY  2020 January   Cataracts   HERNIA REPAIR     JOINT REPLACEMENT  2003   Knee   LUMBAR LAMINECTOMY     L3-4   NEPHRECTOMY Right    Partial   SPINE SURGERY     C1   TONSILLECTOMY     TOTAL KNEE ARTHROPLASTY Left    TUBAL LIGATION     ULNAR NERVE REPAIR Left    Patient Active Problem List   Diagnosis Date Noted   Lower extremity pain 09/01/2022   Dysuria 04/11/2022   Shortness of breath 06/10/2021   Chronic scapular pain 12/11/2020   Poor balance 09/12/2020   Diabetes mellitus (HCC) 08/22/2020   Chronic kidney disease, stage 3, mod decreased GFR (HCC) 03/09/2020   History of partial nephrectomy 09/08/2019   Laryngopharyngeal  reflux (LPR) 09/06/2019   Lumbar spinal stenosis 03/03/2019   Lower extremity edema 11/21/2018   PVC's (premature ventricular contractions) 04/16/2017   Hyperparathyroidism, secondary (HCC) 07/06/2016   COPD (chronic obstructive pulmonary disease) (HCC) 04/19/2016   Coronary artery disease involving native coronary artery of native heart without angina pectoris 03/10/2016   History of cardiac arrest 10/01/2015   History of ST elevation myocardial infarction (STEMI) 10/01/2015   GERD (gastroesophageal reflux disease), Rx Protonix 02/14/2014   Hx of gastric bypass 02/14/2014   Former smoker, 50 pack years, quit 2010 02/14/2014   Anemia, B12 deficiency 04/04/2009   Osteoporosis 12/14/2006   Hyperlipidemia associated with type 2 diabetes mellitus (HCC) 09/22/2006   Hypertension associated with diabetes (HCC) 09/22/2006   Osteoarthritis, multiple sites 09/22/2006   Degenerative joint disease (DJD) of lumbar spine 09/22/2006   Adenomatous polyp of colon 02/04/2004     REFERRING PROVIDER: Glendale Chard, DO  REFERRING DIAG: M79.604,M79.605 (ICD-10-CM) - Bilateral leg pain             M48.061 (ICD-10-CM) - Spinal stenosis of lumbar region without neurogenic claudication  Rationale for Evaluation and Treatment: Rehabilitation  THERAPY DIAG:  Muscle weakness (generalized)  Pain in thoracic spine  Pain in right leg  Pain in left leg  Difficulty in walking, not elsewhere classified  Other low back pain  ONSET DATE: Chronic back pain and leg weakness ; July pain in lower extremities   SUBJECTIVE:                                                                                                                                                                                           SUBJECTIVE STATEMENT: Pt reports no new changes since  evaluation.  She knows how to swim, but hasn't been in pool in 5 years. "Today is a good day".   FROM EVAL: Pt states she has had back pain for years  and leg weakness for at least 3-5 years.  Pt began having shooting pain down les in late July.  Sx's occur mostly when sitting or standing.  PT order indicated Leg Strengthening She has history of lumbar canal stenosis at L4-5.She has history of lumbar spinal stenosis at L4-5   Pt states neurosurgeon has told her she needed surgery for years.  Pt has a quad cane and rollator.  She prefers the rollator over the cane, but doesn't always use an AD.  Pt pushes a cart with grocery shopping.  She has increased pain with grocery shopping and has difficulty straightening up.  Pt states she leans forward when having pain with ambulation.  Pt states she has shooting pain "whenever it decides to come".  Pt can have shooting pains down leg in sitting, standing, or lying.  Worst pain is with carrying/lifting objects including carrying groceries.  Pt is limited with standing duration which impairs her ability to cook.  Pt had PT approx 4 years ago and reports it didn't hlep.    PERTINENT HISTORY:  Per dx.  spondylolisthesis at L4-5 Osteoporosis, COPD, HTN, CAD, CKD, arthritis, MI in 2001 and 2017 Cervical surgery including on C1, gastric bypass surgery, L TKR, partial nephrectomy, Bladder incontinence--wear pads  PAIN:  Are you in pain?  Yes  NPRS:  2/10  Location:  generalized    PRECAUTIONS: Other: osteoporosis, spondylolisthesis, L TKR, cervical surgery   WEIGHT BEARING RESTRICTIONS: No  FALLS:  Has patient fallen in last 6 months? No  LIVING ENVIRONMENT: Lives with: lives alone Lives in: 1 story home Has following equipment at home: rollator, quad cane   PLOF: IndependentPt has had chronic back pain and leg weakness which has affected her daily activities and functional mobility.   PATIENT GOALS: stand for 1 hour, to be pain free    OBJECTIVE:   DIAGNOSTIC FINDINGS:  CT myelogram 02/11/2013: 1. Small central disc protrusion L2-3 without significant  compressive pathology.  2. Mild  multifactorial spinal stenosis L3-4.  3. Advanced facet disease L4-5 allowing grade 1 anterolisthesis  without dynamic instability, and resulting in moderate central canal  stenosis.  4. Asymmetric facet degenerative change L5-S1, left worse than right.   Lumbar x ray in 2019: IMPRESSION: Stable spondylolisthesis at L4-5. No fracture. Lower lumbar scoliosis, stable. Mild disc space narrowing L4-5.   There is aortic atherosclerosis.  PATIENT SURVEYS:  FOTO 40 with a goal of 48 at visit 12  SCREENING FOR RED FLAGS: Bowel or bladder incontinence: Yes: bladder wear pads Spinal tumors: No Cauda equina syndrome: No Compression fracture: No Abdominal aneurysm: No  COGNITION: Overall cognitive status: Within functional limits for tasks assessed     SENSATION: Bilat LE dermatomes 2+ t/o LT except L4 on R LE 1+ and L5 on L LE 1+      LOWER EXTREMITY MMT:    MMT Right eval Left eval  Hip flexion Tolerates slight resistance Tolerates min resistance  Hip extension    Hip abduction 16.1 18.2  Hip adduction    Hip internal rotation    Hip external rotation    Knee flexion Unable to tolerate resistance (seated) 4+/5 (seated)  Knee extension 4-/5 4/5  Ankle dorsiflexion 4/5 5/5  Ankle plantarflexion WFL seated WFL seated  Ankle inversion  Ankle eversion     (Blank rows = not tested)   GAIT:  Comments: slow gait speed.  Bilat toe out.  Pt ambulated without AD and had a LOB when turning and used the wall for LOB.   TODAY'S TREATMENT:                                                                                                                               Pt seen for aquatic therapy today.  Treatment took place in water 3.5-4.75 ft in depth at the [facility] pool. Temp of water was 91.  Pt entered/exited the pool via stairs independently with bilat rail. * UE on barbell - walking forward/backward -> unsupported walking forward  /backward * side stepping with  arm addct/abdct * relaxed squat holding wall  * TrA set with short hollow noodle x 8 (challenge) * bilat horiz abdct/ addct with rainbow hand floats; row motion with UE * holding wall:  hip abdct/ addct x 10; heel raises x 10 * return to walking   Pt requires the buoyancy and hydrostatic pressure of water for support, and to offload joints by unweighting joint load by at least 50 % in navel deep water and by at least 75-80% in chest to neck deep water.  Viscosity of the water is needed for resistance of strengthening. Water current perturbations provides challenge to standing balance requiring increased core activation.    PATIENT EDUCATION:  Education details:  aquatic therapy introduction  Person educated: Patient Education method: Explanation Education comprehension: verbalized understanding and needs further education  HOME EXERCISE PROGRAM: TBA  ASSESSMENT:  CLINICAL IMPRESSION: Pt is safe and independent in aquatic setting and able to take direction from therapist on deck.  She prefers to walk unassisted because UE on hand floats increased upper trap/shoulder discomfort. Will continue to progress as tolerated, and will include some gentle stretches for home.  Encouraged pt to trial 3 min of LE/UE on Nustep, and 4-7 min with LE only (has NuStep at home, but currently not using it). Issue posture/ body mechanics hand out.    From initial evaluation:  Patient is a 77 y.o. female with dx's of bilateral leg pain and spinal stenosis of lumbar region without neurogenic claudication.  Pt has had back pain for years and leg weakness for at least 3-5 years though began having shooting pain down legs in late July.  Pt has increased pain with ambulation, sitting, and standing.  She is limited with standing duration due to pain which impairs her ability to perform household chores including cooking.  Her worst pain is with carrying/lifting objects including carrying groceries.  She has increased  pain with grocery shopping and has difficulty straightening up.  She has weakness in bilat LE's.  Pt should benefit from skilled PT services to address impairments and to improve overall function.     OBJECTIVE IMPAIRMENTS: Abnormal gait, decreased activity tolerance, decreased endurance, decreased mobility, difficulty  walking, decreased strength, and pain.   ACTIVITY LIMITATIONS: carrying, lifting, sitting, standing, and locomotion level  PARTICIPATION LIMITATIONS: meal prep, cleaning, shopping, and community activity  PERSONAL FACTORS: Time since onset of injury/illness/exacerbation and 3+ comorbidities: Osteoporosis, COPD, CKD, arthritis, L TKR, cervical surgery, MI in 2001 and 2017  are also affecting patient's functional outcome.   REHAB POTENTIAL: Good  CLINICAL DECISION MAKING: Evolving/moderate complexity  EVALUATION COMPLEXITY: Moderate   GOALS:  SHORT TERM GOALS: Target date: 11/20/2022   Pt will be independent and compliant with HEP for improved pain, strength, tolerance to activity, and function.  Baseline: Goal status: INITIAL  2.  Pt will report at least a 25% improvement in pain and sx's overall.  Baseline:  Goal status: INITIAL  3.  Pt will report improved tolerance with standing including increased time standing to cook with reduced pain.  Baseline:  Goal status: INITIAL   LONG TERM GOALS: Target date: 12/11/2022  Pt will ambulate in grocery store without significant pain and improved posture.  Baseline:  Goal status: INITIAL  2.  Pt will demo improved LE strength to at least 4/5 in bilat hip flexion, 5/5 in bilat knee exte, 4/5 in R knee flexion and 25# in bilat hip abduction for improved tolerance with and performance of functional mobility.  Baseline:  Goal status: INITIAL  3.  Pt will report at least a 70% improvement in pain with cooking and carrying groceries.   Baseline:  Goal status: INITIAL  4.  Pt will report improved stability with gait  including turning.  Baseline:  Goal status: INITIAL     PLAN:  PT FREQUENCY: 1-2x/week  PT DURATION: 6 weeks  PLANNED INTERVENTIONS: Therapeutic exercises, Therapeutic activity, Neuromuscular re-education, Balance training, Gait training, Patient/Family education, Self Care, Stair training, DME instructions, Aquatic Therapy, Dry Needling, Cryotherapy, Moist heat, Vasopneumatic device, Ultrasound, Manual therapy, and Re-evaluation.  PLAN FOR NEXT SESSION: core and LE strengthening.  Gait and balance training.  Mayer Camel, PTA 11/03/22 1:24 PM [facility] [address] [place], [place], [address] Phone: [phone]   Fax:  [phone]

## 2022-11-07 ENCOUNTER — Encounter (HOSPITAL_BASED_OUTPATIENT_CLINIC_OR_DEPARTMENT_OTHER): Payer: Self-pay | Admitting: Physical Therapy

## 2022-11-09 ENCOUNTER — Encounter: Payer: Self-pay | Admitting: Pharmacist

## 2022-11-09 NOTE — Progress Notes (Signed)
Pharmacy Quality Measure Review  This patient is appearing on a report for being at risk of failing the adherence measure for diabetes medications this calendar year.   Medication: Ozempic 0.5 mg Last fill date: 8/8 for 28 day supply  Patient approved for [employer] patient assistance. No action needed at this time.   Jarrett Ables, PharmD PGY-1 Pharmacy Resident

## 2022-11-10 ENCOUNTER — Ambulatory Visit (HOSPITAL_BASED_OUTPATIENT_CLINIC_OR_DEPARTMENT_OTHER): Payer: PPO | Admitting: Physical Therapy

## 2022-11-10 ENCOUNTER — Ambulatory Visit (HOSPITAL_BASED_OUTPATIENT_CLINIC_OR_DEPARTMENT_OTHER): Payer: PPO | Attending: Neurology | Admitting: Physical Therapy

## 2022-11-10 ENCOUNTER — Encounter (HOSPITAL_BASED_OUTPATIENT_CLINIC_OR_DEPARTMENT_OTHER): Payer: Self-pay | Admitting: Physical Therapy

## 2022-11-10 DIAGNOSIS — M79604 Pain in right leg: Secondary | ICD-10-CM | POA: Insufficient documentation

## 2022-11-10 DIAGNOSIS — M546 Pain in thoracic spine: Secondary | ICD-10-CM | POA: Insufficient documentation

## 2022-11-10 DIAGNOSIS — M79605 Pain in left leg: Secondary | ICD-10-CM | POA: Insufficient documentation

## 2022-11-10 DIAGNOSIS — M6281 Muscle weakness (generalized): Secondary | ICD-10-CM | POA: Diagnosis not present

## 2022-11-10 NOTE — Therapy (Signed)
OUTPATIENT PHYSICAL THERAPY THORACOLUMBAR TREATMENT   Patient Name: Tricia [name] MRN: [id number] DOB:1946-01-18, 77 y.o., female Today's Date: 11/10/2022  END OF SESSION:  PT End of Session - 11/10/22 1424     Visit Number 3    Number of Visits 10    Date for PT Re-Evaluation 12/11/22    Authorization Type HTA    PT Start Time 1418    PT Stop Time 1500    PT Time Calculation (min) 42 min    Activity Tolerance Patient tolerated treatment well    Behavior During Therapy WFL for tasks assessed/performed             Past Medical History:  Diagnosis Date   Adenomatous polyp of colon 02/2004   Allergy 2000   Sulfur   Anemia    Anxiety    Arthritis    B12 deficiency    CAD (coronary artery disease)    a. s/p remote BMS to LAD;  b. 09/2015 Inf STEMI/VF Arrest: LM nl, LAD 85p (staged PCI 2 days later w/ 3.0x32 Synergy DES), 40p/m, 25d, LCX 83m, RCA 80ost/100p (2.75x32 Synergy DES), 29m, EF 55-65%. // c. Myoview 2/18: EF 59, no ischemia or infarction; Normal study // Myoview 3/22: EF 70, no ischemia or infarction; low risk   Chronic kidney disease, stage 3, mod decreased GFR (HCC) 03/09/2020   COPD (chronic obstructive pulmonary disease) (HCC) 2018   Chest X-ray   Dermatophytosis of groin and perianal area    Diet Controlled Diabetes Mellitus    Diverticulosis    GERD (gastroesophageal reflux disease)    H/O echocardiogram    a. 09/2015 Echo: EF 60-65%, no rwma, mild AI, mildly dil RA, mild to mod TR.   History of echocardiogram    Echo 3/22: EF 60-65, no RWMA, GR 1 DD, normal RVSF, mild LAE, RVSP 35.5, mild to moderate TR, trivial AI   Hyperlipidemia    Hypertension    Hypertensive heart disease    Low back pain    l5 disc   Lumbar spinal stenosis 03/03/2019   Morbid obesity (HCC)    Myocardial infarction (HCC) 2001, 2017   Neuromuscular disorder (HCC)    Osteoporosis    PVC's (premature ventricular contractions) 04/16/2017   Holter 3/19: NSR, average heart rate 69,  frequent PVCs (5% total beats), rare supraventricular ectopics, no AF/flutter   Sleep apnea 10/03/2009   Resolved after gastric bypass     TOBACCO ABUSE 10/05/2009   Qualifier: Diagnosis of  By: Jens Som, MD, Lyn Hollingshead    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 In setting of inferior STEMI.   Past Surgical History:  Procedure Laterality Date   ABDOMINAL HYSTERECTOMY     APPENDECTOMY     BARIATRIC SURGERY     CARDIAC CATHETERIZATION N/A 10/01/2015   Procedure: Left Heart Cath and Coronary Angiography;  Surgeon: Marykay Lex, MD;  Location: [facility] INVASIVE CV LAB;  Service: Cardiovascular;  Laterality: N/A;   CARDIAC CATHETERIZATION N/A 10/01/2015   Procedure: Coronary Stent Intervention;  Surgeon: Marykay Lex, MD;  Location: [facility] INVASIVE CV LAB;  Service: Cardiovascular;  Laterality: N/A;   CARDIAC CATHETERIZATION N/A 10/03/2015   Procedure: Coronary Stent Intervention;  Surgeon: Lennette Bihari, MD;  Location: [facility] INVASIVE CV LAB;  Service: Cardiovascular;  Laterality: N/A;   CARDIAC CATHETERIZATION N/A 10/03/2015   Procedure: Coronary/Graft Angiography;  Surgeon: Lennette Bihari, MD;  Location: [facility] INVASIVE CV LAB;  Service: Cardiovascular;  Laterality: N/A;  CARPAL TUNNEL RELEASE Bilateral    CERVICAL DISC SURGERY     CHOLECYSTECTOMY     CORONARY ANGIOPLASTY  2002   EYE SURGERY  2020 January   Cataracts   HERNIA REPAIR     JOINT REPLACEMENT  2003   Knee   LUMBAR LAMINECTOMY     L3-4   NEPHRECTOMY Right    Partial   SPINE SURGERY     C1   TONSILLECTOMY     TOTAL KNEE ARTHROPLASTY Left    TUBAL LIGATION     ULNAR NERVE REPAIR Left    Patient Active Problem List   Diagnosis Date Noted   Lower extremity pain 09/01/2022   Dysuria 04/11/2022   Shortness of breath 06/10/2021   Chronic scapular pain 12/11/2020   Poor balance 09/12/2020   Diabetes mellitus (HCC) 08/22/2020   Chronic kidney disease, stage 3, mod decreased GFR (HCC) 03/09/2020   History of  partial nephrectomy 09/08/2019   Laryngopharyngeal reflux (LPR) 09/06/2019   Lumbar spinal stenosis 03/03/2019   Lower extremity edema 11/21/2018   PVC's (premature ventricular contractions) 04/16/2017   Hyperparathyroidism, secondary (HCC) 07/06/2016   COPD (chronic obstructive pulmonary disease) (HCC) 04/19/2016   Coronary artery disease involving native coronary artery of native heart without angina pectoris 03/10/2016   History of cardiac arrest 10/01/2015   History of ST elevation myocardial infarction (STEMI) 10/01/2015   GERD (gastroesophageal reflux disease), Rx Protonix 02/14/2014   Hx of gastric bypass 02/14/2014   Former smoker, 50 pack years, quit 2010 02/14/2014   Anemia, B12 deficiency 04/04/2009   Osteoporosis 12/14/2006   Hyperlipidemia associated with type 2 diabetes mellitus (HCC) 09/22/2006   Hypertension associated with diabetes (HCC) 09/22/2006   Osteoarthritis, multiple sites 09/22/2006   Degenerative joint disease (DJD) of lumbar spine 09/22/2006   Adenomatous polyp of colon 02/04/2004     REFERRING PROVIDER: Glendale Chard, DO  REFERRING DIAG: M79.604,M79.605 (ICD-10-CM) - Bilateral leg pain             M48.061 (ICD-10-CM) - Spinal stenosis of lumbar region without neurogenic claudication  Rationale for Evaluation and Treatment: Rehabilitation  THERAPY DIAG:  Muscle weakness (generalized)  Pain in thoracic spine  Pain in right leg  ONSET DATE: Chronic back pain and leg weakness ; July pain in lower extremities   SUBJECTIVE:                                                                                                                                                                                           SUBJECTIVE STATEMENT: Pt reports good morning without pain until she came to aquatic therapy walkin glong  distance to setting  FROM EVAL: Pt states she has had back pain for years and leg weakness for at least 3-5 years.  Pt began having  shooting pain down les in late July.  Sx's occur mostly when sitting or standing.  PT order indicated Leg Strengthening She has history of lumbar canal stenosis at L4-5.She has history of lumbar spinal stenosis at L4-5   Pt states neurosurgeon has told her she needed surgery for years.  Pt has a quad cane and rollator.  She prefers the rollator over the cane, but doesn't always use an AD.  Pt pushes a cart with grocery shopping.  She has increased pain with grocery shopping and has difficulty straightening up.  Pt states she leans forward when having pain with ambulation.  Pt states she has shooting pain "whenever it decides to come".  Pt can have shooting pains down leg in sitting, standing, or lying.  Worst pain is with carrying/lifting objects including carrying groceries.  Pt is limited with standing duration which impairs her ability to cook.  Pt had PT approx 4 years ago and reports it didn't hlep.    PERTINENT HISTORY:  Per dx.  spondylolisthesis at L4-5 Osteoporosis, COPD, HTN, CAD, CKD, arthritis, MI in 2001 and 2017 Cervical surgery including on C1, gastric bypass surgery, L TKR, partial nephrectomy, Bladder incontinence--wear pads  PAIN:  Are you in pain?  Yes  NPRS:  4/10  Location:  generalized    PRECAUTIONS: Other: osteoporosis, spondylolisthesis, L TKR, cervical surgery   WEIGHT BEARING RESTRICTIONS: No  FALLS:  Has patient fallen in last 6 months? No  LIVING ENVIRONMENT: Lives with: lives alone Lives in: 1 story home Has following equipment at home: rollator, quad cane   PLOF: IndependentPt has had chronic back pain and leg weakness which has affected her daily activities and functional mobility.   PATIENT GOALS: stand for 1 hour, to be pain free    OBJECTIVE:   DIAGNOSTIC FINDINGS:  02/24/17 Study: Lumbar spine x-rays. Views: AP, lateral, flexion, extension, obliques, cone-down lumbosacral junction, AP thoracic (7 lumbar, 1 thoracic view). Indications:  Right lumbar radiculopathy. Findings: The patient has a degenerative thoracolumbar scoliosis that appears to have progressed some over the past 4 years. There is a grade 1 minimally dynamic spondylolisthesis L4-5 that does not appear significantly changed from 4 years ago. There is significant L4-5 facet arthropathy presumably responsible for the spondylolisthesis.    CT myelogram 02/11/2013: 1. Small central disc protrusion L2-3 without significant  compressive pathology.  2. Mild multifactorial spinal stenosis L3-4.  3. Advanced facet disease L4-5 allowing grade 1 anterolisthesis  without dynamic instability, and resulting in moderate central canal  stenosis.  4. Asymmetric facet degenerative change L5-S1, left worse than right.   Lumbar x ray in 2019: IMPRESSION: Stable spondylolisthesis at L4-5. No fracture. Lower lumbar scoliosis, stable. Mild disc space narrowing L4-5.   There is aortic atherosclerosis.  PATIENT SURVEYS:  FOTO 40 with a goal of 48 at visit 12  SCREENING FOR RED FLAGS: Bowel or bladder incontinence: Yes: bladder wear pads Spinal tumors: No Cauda equina syndrome: No Compression fracture: No Abdominal aneurysm: No  COGNITION: Overall cognitive status: Within functional limits for tasks assessed     SENSATION: Bilat LE dermatomes 2+ t/o LT except L4 on R LE 1+ and L5 on L LE 1+      LOWER EXTREMITY MMT:    MMT Right eval Left eval  Hip flexion Tolerates slight resistance Tolerates min resistance  Hip extension    Hip abduction 16.1 18.2  Hip adduction    Hip internal rotation    Hip external rotation    Knee flexion Unable to tolerate resistance (seated) 4+/5 (seated)  Knee extension 4-/5 4/5  Ankle dorsiflexion 4/5 5/5  Ankle plantarflexion WFL seated WFL seated  Ankle inversion    Ankle eversion     (Blank rows = not tested)   GAIT:  Comments: slow gait speed.  Bilat toe out.  Pt ambulated without AD and had a LOB when turning and  used the wall for LOB.   TODAY'S TREATMENT:                                                                                                                               Pt seen for aquatic therapy today.  Treatment took place in water 3.5-4.75 ft in depth at the [facility] pool. Temp of water was 91.  Pt entered/exited the pool via stairs independently with bilat rail.  * unsupported walking forward  /backward * bilat horiz abdct/ addct with rainbow hand floats; row motion with UE *decompression yellow noodle under arms (reduces pain)  - forward and backward walking *standing holding wall relaxed squats; hip abdct/ addct x 8; heel raises x 10 *decompression position with walking * TrA set with short hollow noodle x 5 wide stance; x 5 staggered left foot in front; x 3 right (fair toleration) *seated on lift: cycling and hip add/abd *return to decompression with amb     Pt requires the buoyancy and hydrostatic pressure of water for support, and to offload joints by unweighting joint load by at least 50 % in navel deep water and by at least 75-80% in chest to neck deep water.  Viscosity of the water is needed for resistance of strengthening. Water current perturbations provides challenge to standing balance requiring increased core activation.    PATIENT EDUCATION:  Education details:  aquatic therapy introduction  Person educated: Patient Education method: Explanation Education comprehension: verbalized understanding and needs further education  HOME EXERCISE PROGRAM: TBA  ASSESSMENT:  CLINICAL IMPRESSION: Pt reports increase in LBP with spasms walking distance from parking lot to setting. Throughout session lbp waxed and waned.  TrA sets flared lb slightly.  Decompression reduces. Most exercises modified for improved toleration.  VC and demonstration for execution. She has not yet been on Nustep at home but is planning in the next few days. Pt instructed to use AD next  session as I feel she will tolerate exercises better.  She VU. 02/21/22 diagnostics added.    From initial evaluation:  Patient is a 77 y.o. female with dx's of bilateral leg pain and spinal stenosis of lumbar region without neurogenic claudication.  Pt has had back pain for years and leg weakness for at least 3-5 years though began having shooting pain down legs in late July.  Pt has increased pain with ambulation, sitting, and standing.  She is limited with standing duration due to pain which impairs her ability to perform household chores including cooking.  Her worst pain is with carrying/lifting objects including carrying groceries.  She has increased pain with grocery shopping and has difficulty straightening up.  She has weakness in bilat LE's.  Pt should benefit from skilled PT services to address impairments and to improve overall function.     OBJECTIVE IMPAIRMENTS: Abnormal gait, decreased activity tolerance, decreased endurance, decreased mobility, difficulty walking, decreased strength, and pain.   ACTIVITY LIMITATIONS: carrying, lifting, sitting, standing, and locomotion level  PARTICIPATION LIMITATIONS: meal prep, cleaning, shopping, and community activity  PERSONAL FACTORS: Time since onset of injury/illness/exacerbation and 3+ comorbidities: Osteoporosis, COPD, CKD, arthritis, L TKR, cervical surgery, MI in 2001 and 2017  are also affecting patient's functional outcome.   REHAB POTENTIAL: Good  CLINICAL DECISION MAKING: Evolving/moderate complexity  EVALUATION COMPLEXITY: Moderate   GOALS:  SHORT TERM GOALS: Target date: 11/20/2022   Pt will be independent and compliant with HEP for improved pain, strength, tolerance to activity, and function.  Baseline: Goal status: INITIAL  2.  Pt will report at least a 25% improvement in pain and sx's overall.  Baseline:  Goal status: INITIAL  3.  Pt will report improved tolerance with standing including increased time standing  to cook with reduced pain.  Baseline:  Goal status: INITIAL   LONG TERM GOALS: Target date: 12/11/2022  Pt will ambulate in grocery store without significant pain and improved posture.  Baseline:  Goal status: INITIAL  2.  Pt will demo improved LE strength to at least 4/5 in bilat hip flexion, 5/5 in bilat knee exte, 4/5 in R knee flexion and 25# in bilat hip abduction for improved tolerance with and performance of functional mobility.  Baseline:  Goal status: INITIAL  3.  Pt will report at least a 70% improvement in pain with cooking and carrying groceries.   Baseline:  Goal status: INITIAL  4.  Pt will report improved stability with gait including turning.  Baseline:  Goal status: INITIAL     PLAN:  PT FREQUENCY: 1-2x/week  PT DURATION: 6 weeks  PLANNED INTERVENTIONS: Therapeutic exercises, Therapeutic activity, Neuromuscular re-education, Balance training, Gait training, Patient/Family education, Self Care, Stair training, DME instructions, Aquatic Therapy, Dry Needling, Cryotherapy, Moist heat, Vasopneumatic device, Ultrasound, Manual therapy, and Re-evaluation.  PLAN FOR NEXT SESSION: core and LE strengthening.  Gait and balance training.  [address] [place]) Calais Svehla MPT 11/10/22 2:30 PM [facility] [address] [place], [place], [address] Phone: [phone]   Fax:  [phone]

## 2022-11-13 ENCOUNTER — Encounter (HOSPITAL_BASED_OUTPATIENT_CLINIC_OR_DEPARTMENT_OTHER): Payer: PPO | Admitting: Physical Therapy

## 2022-11-17 ENCOUNTER — Encounter (HOSPITAL_BASED_OUTPATIENT_CLINIC_OR_DEPARTMENT_OTHER): Payer: Self-pay | Admitting: Physical Therapy

## 2022-11-17 ENCOUNTER — Ambulatory Visit (HOSPITAL_BASED_OUTPATIENT_CLINIC_OR_DEPARTMENT_OTHER): Payer: PPO | Admitting: Physical Therapy

## 2022-11-17 DIAGNOSIS — M6281 Muscle weakness (generalized): Secondary | ICD-10-CM | POA: Diagnosis not present

## 2022-11-17 NOTE — Therapy (Signed)
OUTPATIENT PHYSICAL THERAPY THORACOLUMBAR TREATMENT   Patient Name: Tricia [name] MRN: [id number] DOB:1945/12/09, 77 y.o., female Today's Date: 11/17/2022  END OF SESSION:  PT End of Session - 11/17/22 1113     Visit Number 4    Number of Visits 10    Date for PT Re-Evaluation 12/11/22    Authorization Type HTA    PT Start Time 1115    PT Stop Time 1155    PT Time Calculation (min) 40 min    Activity Tolerance Patient tolerated treatment well    Behavior During Therapy [facility] for tasks assessed/performed             Past Medical History:  Diagnosis Date   Adenomatous polyp of colon 02/2004   Allergy 2000   Sulfur   Anemia    Anxiety    Arthritis    B12 deficiency    CAD (coronary artery disease)    a. s/p remote BMS to LAD;  b. 09/2015 Inf STEMI/VF Arrest: LM nl, LAD 85p (staged PCI 2 days later w/ 3.0x32 Synergy DES), 40p/m, 25d, LCX 50m, RCA 80ost/100p (2.75x32 Synergy DES), 34m, EF 55-65%. // c. Myoview 2/18: EF 59, no ischemia or infarction; Normal study // Myoview 3/22: EF 70, no ischemia or infarction; low risk   Chronic kidney disease, stage 3, mod decreased GFR (HCC) 03/09/2020   COPD (chronic obstructive pulmonary disease) (HCC) 2018   Chest X-ray   Dermatophytosis of groin and perianal area    Diet Controlled Diabetes Mellitus    Diverticulosis    GERD (gastroesophageal reflux disease)    H/O echocardiogram    a. 09/2015 Echo: EF 60-65%, no rwma, mild AI, mildly dil RA, mild to mod TR.   History of echocardiogram    Echo 3/22: EF 60-65, no RWMA, GR 1 DD, normal RVSF, mild LAE, RVSP 35.5, mild to moderate TR, trivial AI   Hyperlipidemia    Hypertension    Hypertensive heart disease    Low back pain    l5 disc   Lumbar spinal stenosis 03/03/2019   Morbid obesity (HCC)    Myocardial infarction (HCC) 2001, 2017   Neuromuscular disorder (HCC)    Osteoporosis    PVC's (premature ventricular contractions) 04/16/2017   Holter 3/19: NSR, average heart rate 69,  frequent PVCs (5% total beats), rare supraventricular ectopics, no AF/flutter   Sleep apnea 10/03/2009   Resolved after gastric bypass     TOBACCO ABUSE 10/05/2009   Qualifier: Diagnosis of  By: Jens Som, MD, Lyn Hollingshead    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 In setting of inferior STEMI.   Past Surgical History:  Procedure Laterality Date   ABDOMINAL HYSTERECTOMY     APPENDECTOMY     BARIATRIC SURGERY     CARDIAC CATHETERIZATION N/A 10/01/2015   Procedure: Left Heart Cath and Coronary Angiography;  Surgeon: Marykay Lex, MD;  Location: [facility] INVASIVE CV LAB;  Service: Cardiovascular;  Laterality: N/A;   CARDIAC CATHETERIZATION N/A 10/01/2015   Procedure: Coronary Stent Intervention;  Surgeon: Marykay Lex, MD;  Location: [facility] INVASIVE CV LAB;  Service: Cardiovascular;  Laterality: N/A;   CARDIAC CATHETERIZATION N/A 10/03/2015   Procedure: Coronary Stent Intervention;  Surgeon: Lennette Bihari, MD;  Location: [facility] INVASIVE CV LAB;  Service: Cardiovascular;  Laterality: N/A;   CARDIAC CATHETERIZATION N/A 10/03/2015   Procedure: Coronary/Graft Angiography;  Surgeon: Lennette Bihari, MD;  Location: [facility] INVASIVE CV LAB;  Service: Cardiovascular;  Laterality: N/A;  CARPAL TUNNEL RELEASE Bilateral    CERVICAL DISC SURGERY     CHOLECYSTECTOMY     CORONARY ANGIOPLASTY  2002   EYE SURGERY  2020 January   Cataracts   HERNIA REPAIR     JOINT REPLACEMENT  2003   Knee   LUMBAR LAMINECTOMY     L3-4   NEPHRECTOMY Right    Partial   SPINE SURGERY     C1   TONSILLECTOMY     TOTAL KNEE ARTHROPLASTY Left    TUBAL LIGATION     ULNAR NERVE REPAIR Left    Patient Active Problem List   Diagnosis Date Noted   Lower extremity pain 09/01/2022   Dysuria 04/11/2022   Shortness of breath 06/10/2021   Chronic scapular pain 12/11/2020   Poor balance 09/12/2020   Diabetes mellitus (HCC) 08/22/2020   Chronic kidney disease, stage 3, mod decreased GFR (HCC) 03/09/2020   History of  partial nephrectomy 09/08/2019   Laryngopharyngeal reflux (LPR) 09/06/2019   Lumbar spinal stenosis 03/03/2019   Lower extremity edema 11/21/2018   PVC's (premature ventricular contractions) 04/16/2017   Hyperparathyroidism, secondary (HCC) 07/06/2016   COPD (chronic obstructive pulmonary disease) (HCC) 04/19/2016   Coronary artery disease involving native coronary artery of native heart without angina pectoris 03/10/2016   History of cardiac arrest 10/01/2015   History of ST elevation myocardial infarction (STEMI) 10/01/2015   GERD (gastroesophageal reflux disease), Rx Protonix 02/14/2014   Hx of gastric bypass 02/14/2014   Former smoker, 50 pack years, quit 2010 02/14/2014   Anemia, B12 deficiency 04/04/2009   Osteoporosis 12/14/2006   Hyperlipidemia associated with type 2 diabetes mellitus (HCC) 09/22/2006   Hypertension associated with diabetes (HCC) 09/22/2006   Osteoarthritis, multiple sites 09/22/2006   Degenerative joint disease (DJD) of lumbar spine 09/22/2006   Adenomatous polyp of colon 02/04/2004     REFERRING PROVIDER: Glendale Chard, DO  REFERRING DIAG: M79.604,M79.605 (ICD-10-CM) - Bilateral leg pain             M48.061 (ICD-10-CM) - Spinal stenosis of lumbar region without neurogenic claudication  Rationale for Evaluation and Treatment: Rehabilitation  THERAPY DIAG:  Muscle weakness (generalized)  ONSET DATE: Chronic back pain and leg weakness ; July pain in lower extremities   SUBJECTIVE:                                                                                                                                                                                           SUBJECTIVE STATEMENT: Pt reports she hasn't had any spasms in back since last session, until today while walking in from parking lot.  She brought her  cane with, per request of therapist.  "It's been months since I've had no spasms".  Pt reports that she was able to go shopping at multiple  stores after session.   FROM EVAL: Pt states she has had back pain for years and leg weakness for at least 3-5 years.  Pt began having shooting pain down les in late July.  Sx's occur mostly when sitting or standing.  PT order indicated Leg Strengthening She has history of lumbar canal stenosis at L4-5.She has history of lumbar spinal stenosis at L4-5   Pt states neurosurgeon has told her she needed surgery for years.  Pt has a quad cane and rollator.  She prefers the rollator over the cane, but doesn't always use an AD.  Pt pushes a cart with grocery shopping.  She has increased pain with grocery shopping and has difficulty straightening up.  Pt states she leans forward when having pain with ambulation.  Pt states she has shooting pain "whenever it decides to come".  Pt can have shooting pains down leg in sitting, standing, or lying.  Worst pain is with carrying/lifting objects including carrying groceries.  Pt is limited with standing duration which impairs her ability to cook.  Pt had PT approx 4 years ago and reports it didn't hlep.    PERTINENT HISTORY:  Per dx.  spondylolisthesis at L4-5 Osteoporosis, COPD, HTN, CAD, CKD, arthritis, MI in 2001 and 2017 Cervical surgery including on C1, gastric bypass surgery, L TKR, partial nephrectomy, Bladder incontinence--wear pads  PAIN:  Are you in pain?  Yes  NPRS:  1/10  Location:  lower back    PRECAUTIONS: Other: osteoporosis, spondylolisthesis, L TKR, cervical surgery   WEIGHT BEARING RESTRICTIONS: No  FALLS:  Has patient fallen in last 6 months? No  LIVING ENVIRONMENT: Lives with: lives alone Lives in: 1 story home Has following equipment at home: rollator, quad cane   PLOF: IndependentPt has had chronic back pain and leg weakness which has affected her daily activities and functional mobility.   PATIENT GOALS: stand for 1 hour, to be pain free    OBJECTIVE:   DIAGNOSTIC FINDINGS:  02/24/17 Study: Lumbar spine x-rays.  Views: AP, lateral, flexion, extension, obliques, cone-down lumbosacral junction, AP thoracic (7 lumbar, 1 thoracic view). Indications: Right lumbar radiculopathy. Findings: The patient has a degenerative thoracolumbar scoliosis that appears to have progressed some over the past 4 years. There is a grade 1 minimally dynamic spondylolisthesis L4-5 that does not appear significantly changed from 4 years ago. There is significant L4-5 facet arthropathy presumably responsible for the spondylolisthesis.    CT myelogram 02/11/2013: 1. Small central disc protrusion L2-3 without significant  compressive pathology.  2. Mild multifactorial spinal stenosis L3-4.  3. Advanced facet disease L4-5 allowing grade 1 anterolisthesis  without dynamic instability, and resulting in moderate central canal  stenosis.  4. Asymmetric facet degenerative change L5-S1, left worse than right.   Lumbar x ray in 2019: IMPRESSION: Stable spondylolisthesis at L4-5. No fracture. Lower lumbar scoliosis, stable. Mild disc space narrowing L4-5.   There is aortic atherosclerosis.  PATIENT SURVEYS:  FOTO 40 with a goal of 48 at visit 12  SCREENING FOR RED FLAGS: Bowel or bladder incontinence: Yes: bladder wear pads Spinal tumors: No Cauda equina syndrome: No Compression fracture: No Abdominal aneurysm: No  COGNITION: Overall cognitive status: Within functional limits for tasks assessed     SENSATION: Bilat LE dermatomes 2+ t/o LT except L4 on R LE 1+ and L5 on  L LE 1+      LOWER EXTREMITY MMT:    MMT Right eval Left eval  Hip flexion Tolerates slight resistance Tolerates min resistance  Hip extension    Hip abduction 16.1 18.2  Hip adduction    Hip internal rotation    Hip external rotation    Knee flexion Unable to tolerate resistance (seated) 4+/5 (seated)  Knee extension 4-/5 4/5  Ankle dorsiflexion 4/5 5/5  Ankle plantarflexion WFL seated WFL seated  Ankle inversion    Ankle eversion      (Blank rows = not tested)   GAIT:  Comments: slow gait speed.  Bilat toe out.  Pt ambulated without AD and had a LOB when turning and used the wall for LOB.   TODAY'S TREATMENT:                                                                                                                               Pt seen for aquatic therapy today.  Treatment took place in water 3.5-4.75 ft in depth at the [facility] pool. Temp of water was 91.  Pt entered/exited the pool via stairs independently with bilat rail.  * unsupported walking forward  /backward - 5 laps  * yellow noodle under arms behind back (vertical decompression) with side stepping R/L x 2 laps  * bilat horiz abdct/ addct with rainbow hand floats x 10 in standard stance; row motion with UE while  * TrA set with short hollow noodle x 5 staggered stance each LE; unable to tolerate wide stance * return to walking forward/ backward unsupported - cues for even step length (increase L stride for Rt hip stretch) *seated on lift chair (initially painful to 3/10- subsides with rest): cycling and hip add/abd *standing holding wall:  relaxed squats x 10;  hip abdct/ addct x 8; heel raises x 10; alternating single leg  clams x 5 each LE *straddling noodle, holding wall - cycling  When dried off:  X of sensitive skin Rock tape applied to Rt SI joint with 25% to decompress tissue and increase proprioception  Pt requires the buoyancy and hydrostatic pressure of water for support, and to offload joints by unweighting joint load by at least 50 % in navel deep water and by at least 75-80% in chest to neck deep water.  Viscosity of the water is needed for resistance of strengthening. Water current perturbations provides challenge to standing balance requiring increased core activation.    PATIENT EDUCATION:  Education details:  aquatic therapy introduction  Person educated: Patient Education method: Explanation Education comprehension:  verbalized understanding and needs further education  HOME EXERCISE PROGRAM: TBA  ASSESSMENT:  CLINICAL IMPRESSION: Positive response to aquatic therapy thus far.  She did have mild increase in Rt LBP when Rt hip moves into ext and then also when seated in lift chair initially.  Will continue to progress as tolerated. Trial of Rock tape applied ot Rt  SI area at end of session.    From initial evaluation:  Patient is a 77 y.o. female with dx's of bilateral leg pain and spinal stenosis of lumbar region without neurogenic claudication.  Pt has had back pain for years and leg weakness for at least 3-5 years though began having shooting pain down legs in late July.  Pt has increased pain with ambulation, sitting, and standing.  She is limited with standing duration due to pain which impairs her ability to perform household chores including cooking.  Her worst pain is with carrying/lifting objects including carrying groceries.  She has increased pain with grocery shopping and has difficulty straightening up.  She has weakness in bilat LE's.  Pt should benefit from skilled PT services to address impairments and to improve overall function.     OBJECTIVE IMPAIRMENTS: Abnormal gait, decreased activity tolerance, decreased endurance, decreased mobility, difficulty walking, decreased strength, and pain.   ACTIVITY LIMITATIONS: carrying, lifting, sitting, standing, and locomotion level  PARTICIPATION LIMITATIONS: meal prep, cleaning, shopping, and community activity  PERSONAL FACTORS: Time since onset of injury/illness/exacerbation and 3+ comorbidities: Osteoporosis, COPD, CKD, arthritis, L TKR, cervical surgery, MI in 2001 and 2017  are also affecting patient's functional outcome.   REHAB POTENTIAL: Good  CLINICAL DECISION MAKING: Evolving/moderate complexity  EVALUATION COMPLEXITY: Moderate   GOALS:  SHORT TERM GOALS: Target date: 11/20/2022   Pt will be independent and compliant with HEP for  improved pain, strength, tolerance to activity, and function.  Baseline: Goal status: INITIAL  2.  Pt will report at least a 25% improvement in pain and sx's overall.  Baseline:  Goal status: INITIAL  3.  Pt will report improved tolerance with standing including increased time standing to cook with reduced pain.  Baseline:  Goal status: INITIAL   LONG TERM GOALS: Target date: 12/11/2022  Pt will ambulate in grocery store without significant pain and improved posture.  Baseline:  Goal status: INITIAL  2.  Pt will demo improved LE strength to at least 4/5 in bilat hip flexion, 5/5 in bilat knee exte, 4/5 in R knee flexion and 25# in bilat hip abduction for improved tolerance with and performance of functional mobility.  Baseline:  Goal status: INITIAL  3.  Pt will report at least a 70% improvement in pain with cooking and carrying groceries.   Baseline:  Goal status: INITIAL  4.  Pt will report improved stability with gait including turning.  Baseline:  Goal status: INITIAL     PLAN:  PT FREQUENCY: 1-2x/week  PT DURATION: 6 weeks  PLANNED INTERVENTIONS: Therapeutic exercises, Therapeutic activity, Neuromuscular re-education, Balance training, Gait training, Patient/Family education, Self Care, Stair training, DME instructions, Aquatic Therapy, Dry Needling, Cryotherapy, Moist heat, Vasopneumatic device, Ultrasound, Manual therapy, and Re-evaluation.  PLAN FOR NEXT SESSION: core and LE strengthening.  Gait and balance training.  Mayer Camel, PTA 11/17/22 12:00 PM [facility] [address] [place], [place], [address] Phone: [phone]   Fax:  [phone]

## 2022-11-18 ENCOUNTER — Encounter (HOSPITAL_BASED_OUTPATIENT_CLINIC_OR_DEPARTMENT_OTHER): Payer: PPO | Admitting: Physical Therapy

## 2022-11-20 ENCOUNTER — Encounter (HOSPITAL_BASED_OUTPATIENT_CLINIC_OR_DEPARTMENT_OTHER): Payer: PPO | Admitting: Physical Therapy

## 2022-11-20 ENCOUNTER — Ambulatory Visit (HOSPITAL_BASED_OUTPATIENT_CLINIC_OR_DEPARTMENT_OTHER): Payer: PPO | Admitting: Physical Therapy

## 2022-11-20 ENCOUNTER — Encounter (HOSPITAL_BASED_OUTPATIENT_CLINIC_OR_DEPARTMENT_OTHER): Payer: Self-pay | Admitting: Physical Therapy

## 2022-11-20 DIAGNOSIS — M6281 Muscle weakness (generalized): Secondary | ICD-10-CM | POA: Diagnosis not present

## 2022-11-20 DIAGNOSIS — M546 Pain in thoracic spine: Secondary | ICD-10-CM

## 2022-11-20 DIAGNOSIS — M79604 Pain in right leg: Secondary | ICD-10-CM

## 2022-11-20 DIAGNOSIS — M79605 Pain in left leg: Secondary | ICD-10-CM

## 2022-11-20 NOTE — Therapy (Signed)
OUTPATIENT PHYSICAL THERAPY THORACOLUMBAR TREATMENT   Patient Name: Tricia [name] MRN: [id number] DOB:[birth year], 77 y.o., female Today's Date: 11/20/2022  END OF SESSION:  PT End of Session - 11/20/22 0813     Visit Number 5    Number of Visits 10    Date for PT Re-Evaluation 12/11/22    Authorization Type HTA    PT Start Time 0815    PT Stop Time 0855    PT Time Calculation (min) 40 min    Activity Tolerance Patient tolerated treatment well    Behavior During Therapy [facility] for tasks assessed/performed             Past Medical History:  Diagnosis Date   Adenomatous polyp of colon 02/2004   Allergy 2000   Sulfur   Anemia    Anxiety    Arthritis    B12 deficiency    CAD (coronary artery disease)    a. s/p remote BMS to LAD;  b. 09/2015 Inf STEMI/VF Arrest: LM nl, LAD 85p (staged PCI 2 days later w/ 3.0x32 Synergy DES), 40p/m, 25d, LCX 59m, RCA 80ost/100p (2.75x32 Synergy DES), 76m, EF 55-65%. // c. Myoview 2/18: EF 59, no ischemia or infarction; Normal study // Myoview 3/22: EF 70, no ischemia or infarction; low risk   Chronic kidney disease, stage 3, mod decreased GFR (HCC) 03/09/2020   COPD (chronic obstructive pulmonary disease) (HCC) 2018   Chest X-ray   Dermatophytosis of groin and perianal area    Diet Controlled Diabetes Mellitus    Diverticulosis    GERD (gastroesophageal reflux disease)    H/O echocardiogram    a. 09/2015 Echo: EF 60-65%, no rwma, mild AI, mildly dil RA, mild to mod TR.   History of echocardiogram    Echo 3/22: EF 60-65, no RWMA, GR 1 DD, normal RVSF, mild LAE, RVSP 35.5, mild to moderate TR, trivial AI   Hyperlipidemia    Hypertension    Hypertensive heart disease    Low back pain    l5 disc   Lumbar spinal stenosis 03/03/2019   Morbid obesity (HCC)    Myocardial infarction (HCC) 2001, 2017   Neuromuscular disorder (HCC)    Osteoporosis    PVC's (premature ventricular contractions) 04/16/2017   Holter 3/19: NSR, average heart rate 69,  frequent PVCs (5% total beats), rare supraventricular ectopics, no AF/flutter   Sleep apnea 10/03/2009   Resolved after gastric bypass     TOBACCO ABUSE 10/05/2009   Qualifier: Diagnosis of  By: Jens Som, MD, Lyn Hollingshead    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 In setting of inferior STEMI.   Past Surgical History:  Procedure Laterality Date   ABDOMINAL HYSTERECTOMY     APPENDECTOMY     BARIATRIC SURGERY     CARDIAC CATHETERIZATION N/A 10/01/2015   Procedure: Left Heart Cath and Coronary Angiography;  Surgeon: Marykay Lex, MD;  Location: [facility] INVASIVE CV LAB;  Service: Cardiovascular;  Laterality: N/A;   CARDIAC CATHETERIZATION N/A 10/01/2015   Procedure: Coronary Stent Intervention;  Surgeon: Marykay Lex, MD;  Location: [facility] INVASIVE CV LAB;  Service: Cardiovascular;  Laterality: N/A;   CARDIAC CATHETERIZATION N/A 10/03/2015   Procedure: Coronary Stent Intervention;  Surgeon: Lennette Bihari, MD;  Location: [facility] INVASIVE CV LAB;  Service: Cardiovascular;  Laterality: N/A;   CARDIAC CATHETERIZATION N/A 10/03/2015   Procedure: Coronary/Graft Angiography;  Surgeon: Lennette Bihari, MD;  Location: [facility] INVASIVE CV LAB;  Service: Cardiovascular;  Laterality: N/A;  CARPAL TUNNEL RELEASE Bilateral    CERVICAL DISC SURGERY     CHOLECYSTECTOMY     CORONARY ANGIOPLASTY  2002   EYE SURGERY  2020 January   Cataracts   HERNIA REPAIR     JOINT REPLACEMENT  2003   Knee   LUMBAR LAMINECTOMY     L3-4   NEPHRECTOMY Right    Partial   SPINE SURGERY     C1   TONSILLECTOMY     TOTAL KNEE ARTHROPLASTY Left    TUBAL LIGATION     ULNAR NERVE REPAIR Left    Patient Active Problem List   Diagnosis Date Noted   Lower extremity pain 09/01/2022   Dysuria 04/11/2022   Shortness of breath 06/10/2021   Chronic scapular pain 12/11/2020   Poor balance 09/12/2020   Diabetes mellitus (HCC) 08/22/2020   Chronic kidney disease, stage 3, mod decreased GFR (HCC) 03/09/2020   History of  partial nephrectomy 09/08/2019   Laryngopharyngeal reflux (LPR) 09/06/2019   Lumbar spinal stenosis 03/03/2019   Lower extremity edema 11/21/2018   PVC's (premature ventricular contractions) 04/16/2017   Hyperparathyroidism, secondary (HCC) 07/06/2016   COPD (chronic obstructive pulmonary disease) (HCC) 04/19/2016   Coronary artery disease involving native coronary artery of native heart without angina pectoris 03/10/2016   History of cardiac arrest 10/01/2015   History of ST elevation myocardial infarction (STEMI) 10/01/2015   GERD (gastroesophageal reflux disease), Rx Protonix 02/14/2014   Hx of gastric bypass 02/14/2014   Former smoker, 50 pack years, quit 2010 02/14/2014   Anemia, B12 deficiency 04/04/2009   Osteoporosis 12/14/2006   Hyperlipidemia associated with type 2 diabetes mellitus (HCC) 09/22/2006   Hypertension associated with diabetes (HCC) 09/22/2006   Osteoarthritis, multiple sites 09/22/2006   Degenerative joint disease (DJD) of lumbar spine 09/22/2006   Adenomatous polyp of colon 02/04/2004     REFERRING PROVIDER: Glendale Chard, DO  REFERRING DIAG: M79.604,M79.605 (ICD-10-CM) - Bilateral leg pain             M48.061 (ICD-10-CM) - Spinal stenosis of lumbar region without neurogenic claudication  Rationale for Evaluation and Treatment: Rehabilitation  THERAPY DIAG:  Muscle weakness (generalized)  Pain in thoracic spine  Pain in right leg  Pain in left leg  ONSET DATE: Chronic back pain and leg weakness ; July pain in lower extremities   SUBJECTIVE:                                                                                                                                                                                           SUBJECTIVE STATEMENT: Pt reports she did well, without spasms, until she  lifted a pot of potatoes.  She also had "shocks" in Lt arch of foot in middle of night.  Overall she reports 100% improvement in symptoms since starting  therapy. Pt reporting trail of more neutral position (vs head and feet elevated)   FROM EVAL: Pt states she has had back pain for years and leg weakness for at least 3-5 years.  Pt began having shooting pain down les in late July.  Sx's occur mostly when sitting or standing.  PT order indicated Leg Strengthening She has history of lumbar canal stenosis at L4-5.She has history of lumbar spinal stenosis at L4-5   Pt states neurosurgeon has told her she needed surgery for years.  Pt has a quad cane and rollator.  She prefers the rollator over the cane, but doesn't always use an AD.  Pt pushes a cart with grocery shopping.  She has increased pain with grocery shopping and has difficulty straightening up.  Pt states she leans forward when having pain with ambulation.  Pt states she has shooting pain "whenever it decides to come".  Pt can have shooting pains down leg in sitting, standing, or lying.  Worst pain is with carrying/lifting objects including carrying groceries.  Pt is limited with standing duration which impairs her ability to cook.  Pt had PT approx 4 years ago and reports it didn't hlep.    PERTINENT HISTORY:  Per dx.  spondylolisthesis at L4-5 Osteoporosis, COPD, HTN, CAD, CKD, arthritis, MI in 2001 and 2017 Cervical surgery including on C1, gastric bypass surgery, L TKR, partial nephrectomy, Bladder incontinence--wear pads  PAIN:  Are you in pain?  Yes  NPRS:  2/10  Location: Lt shoulder/upper trap    PRECAUTIONS: Other: osteoporosis, spondylolisthesis, L TKR, cervical surgery   WEIGHT BEARING RESTRICTIONS: No  FALLS:  Has patient fallen in last 6 months? No  LIVING ENVIRONMENT: Lives with: lives alone Lives in: 1 story home Has following equipment at home: rollator, quad cane   PLOF: IndependentPt has had chronic back pain and leg weakness which has affected her daily activities and functional mobility.   PATIENT GOALS: stand for 1 hour, to be pain  free    OBJECTIVE:   DIAGNOSTIC FINDINGS:  02/24/17 Study: Lumbar spine x-rays. Views: AP, lateral, flexion, extension, obliques, cone-down lumbosacral junction, AP thoracic (7 lumbar, 1 thoracic view). Indications: Right lumbar radiculopathy. Findings: The patient has a degenerative thoracolumbar scoliosis that appears to have progressed some over the past 4 years. There is a grade 1 minimally dynamic spondylolisthesis L4-5 that does not appear significantly changed from 4 years ago. There is significant L4-5 facet arthropathy presumably responsible for the spondylolisthesis.    CT myelogram 02/11/2013: 1. Small central disc protrusion L2-3 without significant  compressive pathology.  2. Mild multifactorial spinal stenosis L3-4.  3. Advanced facet disease L4-5 allowing grade 1 anterolisthesis  without dynamic instability, and resulting in moderate central canal  stenosis.  4. Asymmetric facet degenerative change L5-S1, left worse than right.   Lumbar x ray in 2019: IMPRESSION: Stable spondylolisthesis at L4-5. No fracture. Lower lumbar scoliosis, stable. Mild disc space narrowing L4-5.   There is aortic atherosclerosis.  PATIENT SURVEYS:  FOTO 40 with a goal of 48 at visit 12  SCREENING FOR RED FLAGS: Bowel or bladder incontinence: Yes: bladder wear pads Spinal tumors: No Cauda equina syndrome: No Compression fracture: No Abdominal aneurysm: No  COGNITION: Overall cognitive status: Within functional limits for tasks assessed     SENSATION: Bilat LE dermatomes  2+ t/o LT except L4 on R LE 1+ and L5 on L LE 1+      LOWER EXTREMITY MMT:    MMT Right eval Left eval  Hip flexion Tolerates slight resistance Tolerates min resistance  Hip extension    Hip abduction 16.1 18.2  Hip adduction    Hip internal rotation    Hip external rotation    Knee flexion Unable to tolerate resistance (seated) 4+/5 (seated)  Knee extension 4-/5 4/5  Ankle dorsiflexion 4/5 5/5   Ankle plantarflexion WFL seated WFL seated  Ankle inversion    Ankle eversion     (Blank rows = not tested)   GAIT:  Comments: slow gait speed.  Bilat toe out.  Pt ambulated without AD and had a LOB when turning and used the wall for LOB.   TODAY'S TREATMENT:                                                                                                                               Pt seen for aquatic therapy today.  Treatment took place in water 3.5-4.75 ft in depth at the [facility] pool. Temp of water was 91.  Pt entered/exited the pool via stairs independently with bilat rail.  *with noodle under arms: walking forward  /backward  x 3 laps; side stepping x 1 lap; without noodle and arm addct/ abdct x 1 lap  * Marching backwards/forwards with row motion holding rainbow hand floats * staggered stance with bilat shoulder horiz abdct/ addct x 5, each LE forward * tricep press down with rainbow hand floats x 10 * TrA set with short hollow noodle x 10 in wide stance * UE on yellow noodle:  heel raises x 10 (cues for upright trunk); hip abdct/ addct 2 sets of 5 (cues for neutral foot and no leaning); hip flex to ext in toe touch 2 sets of 5 * return to walking forward / backward with noodle under arms * farmer carry with single yellow hand float  *straddling noodle,UE unsupported - cycling across width of pool -2 laps * R/L calf stretch at stairs x 15 sec each   Pt requires the buoyancy and hydrostatic pressure of water for support, and to offload joints by unweighting joint load by at least 50 % in navel deep water and by at least 75-80% in chest to neck deep water.  Viscosity of the water is needed for resistance of strengthening. Water current perturbations provides challenge to standing balance requiring increased core activation.    PATIENT EDUCATION:  Education details:  aquatic therapy introduction  Person educated: Patient Education method: Explanation Education  comprehension: verbalized understanding and needs further education  HOME EXERCISE PROGRAM: TBA  ASSESSMENT:  CLINICAL IMPRESSION: Therapist removed remaining strip of Rock tape at beginning of session; some mild irritation noted at location of edge of tape (skin appeared pink).  Overall positive response to tape with pt reporting decreased SI pain;  however will not retape until skin is improved. Pt has met STG2 with improved symptoms and is progressing towards remaining goals. Session tolerated well with reduction of shoulder pain and no increase in pain in other areas of body.     From initial evaluation:  Patient is a 77 y.o. female with dx's of bilateral leg pain and spinal stenosis of lumbar region without neurogenic claudication.  Pt has had back pain for years and leg weakness for at least 3-5 years though began having shooting pain down legs in late July.  Pt has increased pain with ambulation, sitting, and standing.  She is limited with standing duration due to pain which impairs her ability to perform household chores including cooking.  Her worst pain is with carrying/lifting objects including carrying groceries.  She has increased pain with grocery shopping and has difficulty straightening up.  She has weakness in bilat LE's.  Pt should benefit from skilled PT services to address impairments and to improve overall function.     OBJECTIVE IMPAIRMENTS: Abnormal gait, decreased activity tolerance, decreased endurance, decreased mobility, difficulty walking, decreased strength, and pain.   ACTIVITY LIMITATIONS: carrying, lifting, sitting, standing, and locomotion level  PARTICIPATION LIMITATIONS: meal prep, cleaning, shopping, and community activity  PERSONAL FACTORS: Time since onset of injury/illness/exacerbation and 3+ comorbidities: Osteoporosis, COPD, CKD, arthritis, L TKR, cervical surgery, MI in 2001 and 2017  are also affecting patient's functional outcome.   REHAB POTENTIAL:  Good  CLINICAL DECISION MAKING: Evolving/moderate complexity  EVALUATION COMPLEXITY: Moderate   GOALS:  SHORT TERM GOALS: Target date: 11/20/2022   Pt will be independent and compliant with HEP for improved pain, strength, tolerance to activity, and function.  Baseline: not assigned yet -11/20/22 Goal status: In progress   2.  Pt will report at least a 25% improvement in pain and sx's overall.  Baseline:  Goal status: MET - 11/20/22  3.  Pt will report improved tolerance with standing including increased time standing to cook with reduced pain.  Baseline: has been avoiding cooking -11/20/22 Goal status: In progress    LONG TERM GOALS: Target date: 12/11/2022  Pt will ambulate in grocery store without significant pain and improved posture.  Baseline:  Goal status: INITIAL  2.  Pt will demo improved LE strength to at least 4/5 in bilat hip flexion, 5/5 in bilat knee ext, 4/5 in R knee flexion and 25# in bilat hip abduction for improved tolerance with and performance of functional mobility.  Baseline:  Goal status: INITIAL  3.  Pt will report at least a 70% improvement in pain with cooking and carrying groceries.   Baseline:  Goal status: INITIAL  4.  Pt will report improved stability with gait including turning.  Baseline:  Goal status: INITIAL     PLAN:  PT FREQUENCY: 1-2x/week  PT DURATION: 6 weeks  PLANNED INTERVENTIONS: Therapeutic exercises, Therapeutic activity, Neuromuscular re-education, Balance training, Gait training, Patient/Family education, Self Care, Stair training, DME instructions, Aquatic Therapy, Dry Needling, Cryotherapy, Moist heat, Vasopneumatic device, Ultrasound, Manual therapy, and Re-evaluation.  PLAN FOR NEXT SESSION: core and LE strengthening.  Gait and balance training.  Mayer Camel, PTA 11/20/22 9:08 AM [facility] [address] [place], [place], [address] Phone:  [phone]   Fax:  [phone]

## 2022-11-26 ENCOUNTER — Encounter (HOSPITAL_BASED_OUTPATIENT_CLINIC_OR_DEPARTMENT_OTHER): Payer: Self-pay

## 2022-11-26 ENCOUNTER — Ambulatory Visit (HOSPITAL_BASED_OUTPATIENT_CLINIC_OR_DEPARTMENT_OTHER): Payer: PPO | Admitting: Physical Therapy

## 2022-11-26 ENCOUNTER — Encounter (HOSPITAL_BASED_OUTPATIENT_CLINIC_OR_DEPARTMENT_OTHER): Payer: PPO | Admitting: Physical Therapy

## 2022-11-28 ENCOUNTER — Ambulatory Visit (HOSPITAL_BASED_OUTPATIENT_CLINIC_OR_DEPARTMENT_OTHER): Payer: PPO | Admitting: Physical Therapy

## 2022-11-28 ENCOUNTER — Encounter (HOSPITAL_BASED_OUTPATIENT_CLINIC_OR_DEPARTMENT_OTHER): Payer: Self-pay | Admitting: Physical Therapy

## 2022-11-28 DIAGNOSIS — M6281 Muscle weakness (generalized): Secondary | ICD-10-CM | POA: Diagnosis not present

## 2022-11-28 DIAGNOSIS — M546 Pain in thoracic spine: Secondary | ICD-10-CM

## 2022-11-28 NOTE — Therapy (Signed)
OUTPATIENT PHYSICAL THERAPY THORACOLUMBAR TREATMENT   Patient Name: Tricia [name] MRN: [id number] DOB:1945-08-21, 77 y.o., female Today's Date: 11/28/2022  END OF SESSION:  PT End of Session - 11/28/22 1211     Visit Number 6    Number of Visits 10    Date for PT Re-Evaluation 12/11/22    Authorization Type HTA    PT Start Time 1201    PT Stop Time 1245    PT Time Calculation (min) 44 min    Activity Tolerance Patient tolerated treatment well    Behavior During Therapy WFL for tasks assessed/performed              Past Medical History:  Diagnosis Date   Adenomatous polyp of colon 02/2004   Allergy 2000   Sulfur   Anemia    Anxiety    Arthritis    B12 deficiency    CAD (coronary artery disease)    a. s/p remote BMS to LAD;  b. 09/2015 Inf STEMI/VF Arrest: LM nl, LAD 85p (staged PCI 2 days later w/ 3.0x32 Synergy DES), 40p/m, 25d, LCX 25m, RCA 80ost/100p (2.75x32 Synergy DES), 24m, EF 55-65%. // c. Myoview 2/18: EF 59, no ischemia or infarction; Normal study // Myoview 3/22: EF 70, no ischemia or infarction; low risk   Chronic kidney disease, stage 3, mod decreased GFR (HCC) 03/09/2020   COPD (chronic obstructive pulmonary disease) (HCC) 2018   Chest X-ray   Dermatophytosis of groin and perianal area    Diet Controlled Diabetes Mellitus    Diverticulosis    GERD (gastroesophageal reflux disease)    H/O echocardiogram    a. 09/2015 Echo: EF 60-65%, no rwma, mild AI, mildly dil RA, mild to mod TR.   History of echocardiogram    Echo 3/22: EF 60-65, no RWMA, GR 1 DD, normal RVSF, mild LAE, RVSP 35.5, mild to moderate TR, trivial AI   Hyperlipidemia    Hypertension    Hypertensive heart disease    Low back pain    l5 disc   Lumbar spinal stenosis 03/03/2019   Morbid obesity (HCC)    Myocardial infarction (HCC) 2001, 2017   Neuromuscular disorder (HCC)    Osteoporosis    PVC's (premature ventricular contractions) 04/16/2017   Holter 3/19: NSR, average heart rate  69, frequent PVCs (5% total beats), rare supraventricular ectopics, no AF/flutter   Sleep apnea 10/03/2009   Resolved after gastric bypass     TOBACCO ABUSE 10/05/2009   Qualifier: Diagnosis of  By: Jens Som, MD, Lyn Hollingshead    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 In setting of inferior STEMI.   Past Surgical History:  Procedure Laterality Date   ABDOMINAL HYSTERECTOMY     APPENDECTOMY     BARIATRIC SURGERY     CARDIAC CATHETERIZATION N/A 10/01/2015   Procedure: Left Heart Cath and Coronary Angiography;  Surgeon: Marykay Lex, MD;  Location: [facility] INVASIVE CV LAB;  Service: Cardiovascular;  Laterality: N/A;   CARDIAC CATHETERIZATION N/A 10/01/2015   Procedure: Coronary Stent Intervention;  Surgeon: Marykay Lex, MD;  Location: [facility] INVASIVE CV LAB;  Service: Cardiovascular;  Laterality: N/A;   CARDIAC CATHETERIZATION N/A 10/03/2015   Procedure: Coronary Stent Intervention;  Surgeon: Lennette Bihari, MD;  Location: [facility] INVASIVE CV LAB;  Service: Cardiovascular;  Laterality: N/A;   CARDIAC CATHETERIZATION N/A 10/03/2015   Procedure: Coronary/Graft Angiography;  Surgeon: Lennette Bihari, MD;  Location: [facility] INVASIVE CV LAB;  Service: Cardiovascular;  Laterality: N/A;  CARPAL TUNNEL RELEASE Bilateral    CERVICAL DISC SURGERY     CHOLECYSTECTOMY     CORONARY ANGIOPLASTY  2002   EYE SURGERY  2020 January   Cataracts   HERNIA REPAIR     JOINT REPLACEMENT  2003   Knee   LUMBAR LAMINECTOMY     L3-4   NEPHRECTOMY Right    Partial   SPINE SURGERY     C1   TONSILLECTOMY     TOTAL KNEE ARTHROPLASTY Left    TUBAL LIGATION     ULNAR NERVE REPAIR Left    Patient Active Problem List   Diagnosis Date Noted   Lower extremity pain 09/01/2022   Dysuria 04/11/2022   Shortness of breath 06/10/2021   Chronic scapular pain 12/11/2020   Poor balance 09/12/2020   Diabetes mellitus (HCC) 08/22/2020   Chronic kidney disease, stage 3, mod decreased GFR (HCC) 03/09/2020   History  of partial nephrectomy 09/08/2019   Laryngopharyngeal reflux (LPR) 09/06/2019   Lumbar spinal stenosis 03/03/2019   Lower extremity edema 11/21/2018   PVC's (premature ventricular contractions) 04/16/2017   Hyperparathyroidism, secondary (HCC) 07/06/2016   COPD (chronic obstructive pulmonary disease) (HCC) 04/19/2016   Coronary artery disease involving native coronary artery of native heart without angina pectoris 03/10/2016   History of cardiac arrest 10/01/2015   History of ST elevation myocardial infarction (STEMI) 10/01/2015   GERD (gastroesophageal reflux disease), Rx Protonix 02/14/2014   Hx of gastric bypass 02/14/2014   Former smoker, 50 pack years, quit 2010 02/14/2014   Anemia, B12 deficiency 04/04/2009   Osteoporosis 12/14/2006   Hyperlipidemia associated with type 2 diabetes mellitus (HCC) 09/22/2006   Hypertension associated with diabetes (HCC) 09/22/2006   Osteoarthritis, multiple sites 09/22/2006   Degenerative joint disease (DJD) of lumbar spine 09/22/2006   Adenomatous polyp of colon 02/04/2004     REFERRING PROVIDER: Glendale Chard, DO  REFERRING DIAG: M79.604,M79.605 (ICD-10-CM) - Bilateral leg pain             M48.061 (ICD-10-CM) - Spinal stenosis of lumbar region without neurogenic claudication  Rationale for Evaluation and Treatment: Rehabilitation  THERAPY DIAG:  Muscle weakness (generalized)  Pain in thoracic spine  ONSET DATE: Chronic back pain and leg weakness ; July pain in lower extremities   SUBJECTIVE:                                                                                                                                                                                           SUBJECTIVE STATEMENT: Increased pain and back spasm over past week.  Was unable to come to therapy due to stomach problem.  I have been busy at home probably increased the pain.    FROM EVAL: Pt states she has had back pain for years and leg weakness for at  least 3-5 years.  Pt began having shooting pain down les in late July.  Sx's occur mostly when sitting or standing.  PT order indicated Leg Strengthening She has history of lumbar canal stenosis at L4-5.She has history of lumbar spinal stenosis at L4-5   Pt states neurosurgeon has told her she needed surgery for years.  Pt has a quad cane and rollator.  She prefers the rollator over the cane, but doesn't always use an AD.  Pt pushes a cart with grocery shopping.  She has increased pain with grocery shopping and has difficulty straightening up.  Pt states she leans forward when having pain with ambulation.  Pt states she has shooting pain "whenever it decides to come".  Pt can have shooting pains down leg in sitting, standing, or lying.  Worst pain is with carrying/lifting objects including carrying groceries.  Pt is limited with standing duration which impairs her ability to cook.  Pt had PT approx 4 years ago and reports it didn't hlep.    PERTINENT HISTORY:  Per dx.  spondylolisthesis at L4-5 Osteoporosis, COPD, HTN, CAD, CKD, arthritis, MI in 2001 and 2017 Cervical surgery including on C1, gastric bypass surgery, L TKR, partial nephrectomy, Bladder incontinence--wear pads  PAIN:  Are you in pain?  Yes  NPRS:  2/10  Location: Lt shoulder/upper trap    PRECAUTIONS: Other: osteoporosis, spondylolisthesis, L TKR, cervical surgery   WEIGHT BEARING RESTRICTIONS: No  FALLS:  Has patient fallen in last 6 months? No  LIVING ENVIRONMENT: Lives with: lives alone Lives in: 1 story home Has following equipment at home: rollator, quad cane   PLOF: IndependentPt has had chronic back pain and leg weakness which has affected her daily activities and functional mobility.   PATIENT GOALS: stand for 1 hour, to be pain free    OBJECTIVE:   DIAGNOSTIC FINDINGS:  02/24/17 Study: Lumbar spine x-rays. Views: AP, lateral, flexion, extension, obliques, cone-down lumbosacral junction, AP thoracic  (7 lumbar, 1 thoracic view). Indications: Right lumbar radiculopathy. Findings: The patient has a degenerative thoracolumbar scoliosis that appears to have progressed some over the past 4 years. There is a grade 1 minimally dynamic spondylolisthesis L4-5 that does not appear significantly changed from 4 years ago. There is significant L4-5 facet arthropathy presumably responsible for the spondylolisthesis.    CT myelogram 02/11/2013: 1. Small central disc protrusion L2-3 without significant  compressive pathology.  2. Mild multifactorial spinal stenosis L3-4.  3. Advanced facet disease L4-5 allowing grade 1 anterolisthesis  without dynamic instability, and resulting in moderate central canal  stenosis.  4. Asymmetric facet degenerative change L5-S1, left worse than right.   Lumbar x ray in 2019: IMPRESSION: Stable spondylolisthesis at L4-5. No fracture. Lower lumbar scoliosis, stable. Mild disc space narrowing L4-5.   There is aortic atherosclerosis.  PATIENT SURVEYS:  FOTO 40 with a goal of 48 at visit 12  SCREENING FOR RED FLAGS: Bowel or bladder incontinence: Yes: bladder wear pads Spinal tumors: No Cauda equina syndrome: No Compression fracture: No Abdominal aneurysm: No  COGNITION: Overall cognitive status: Within functional limits for tasks assessed     SENSATION: Bilat LE dermatomes 2+ t/o LT except L4 on R LE 1+ and L5 on L LE 1+      LOWER EXTREMITY MMT:    MMT Right eval Left eval  Hip flexion Tolerates slight resistance Tolerates min resistance  Hip extension    Hip abduction 16.1 18.2  Hip adduction    Hip internal rotation    Hip external rotation    Knee flexion Unable to tolerate resistance (seated) 4+/5 (seated)  Knee extension 4-/5 4/5  Ankle dorsiflexion 4/5 5/5  Ankle plantarflexion WFL seated WFL seated  Ankle inversion    Ankle eversion     (Blank rows = not tested)   GAIT:  Comments: slow gait speed.  Bilat toe out.  Pt ambulated  without AD and had a LOB when turning and used the wall for LOB.   TODAY'S TREATMENT:                                                                                                                               Pt seen for aquatic therapy today.  Treatment took place in water 3.5-4.75 ft in depth at the [facility] pool. Temp of water was 91.  Pt entered/exited the pool via stairs independently with bilat rail.  *with noodle under arms: walking forward  /backward;side stepping multiple widths *decompression using yellow noodle wrapped posteriorly then anteriorly across chest: cycling; hip abdct/add; knees to chest *HB carry rainbow forward and back x2 widths ea * tricep press down with rainbow hand floats 2x5 * staggered stance with bilat shoulder horiz abdct/ addct x 5, each LE forward * TrA set with short hollow noodle x 10 in wide stance  Pt requires the buoyancy and hydrostatic pressure of water for support, and to offload joints by unweighting joint load by at least 50 % in navel deep water and by at least 75-80% in chest to neck deep water.  Viscosity of the water is needed for resistance of strengthening. Water current perturbations provides challenge to standing balance requiring increased core activation.    PATIENT EDUCATION:  Education details:  aquatic therapy introduction  Person educated: Patient Education method: Explanation Education comprehension: verbalized understanding and needs further education  HOME EXERCISE PROGRAM: Access Code: [email or web address] URL: [email or web address] Date: 11/28/2022 Prepared by: Geni Bers  This aquatic home exercise program from [employer] utilizes pictures from land based exercises, but has been adapted prior to lamination and issuance.   Exercises - Hand Buoy Carry  - Noodle press  - 1 x daily - 1-3 x weekly - 1-2 sets - 10 reps - Seated Straddle on Flotation Forward Breast Stroke Arms and Bicycle Legs  - Standing  Shoulder Horizontal Abduction with Resistance  - 1 x daily - 1-3 x weekly - 1-2 sets - 10 reps - Drawing Bow  - 1 x daily - 1-3 x weekly - 1-2 sets - 10 reps - Standing Hip Abduction Adduction at [employer]  - 1 x daily - 1-3 x weekly - 1-2 sets - 10 reps - Standing Hip Flexion Extension at [employer]  - 1 x daily - 1-3 x weekly - 1-2 sets -  10 reps - Heel Toe Raises at Pool Wall  - 1 x daily - 1-3 x weekly - 1-2 sets - 10 reps - Flutter Kicking/Windshield Wipers  - 1 x daily - 1-3 x weekly - 1-2 sets - 10 reps Not issued  ASSESSMENT:  CLINICAL IMPRESSION: Pt has had good relief of symptoms with addition of aquatic therapy.  Had missed an appt due to intestinal issues then had a busy week which came with a return of back spasm which has all but stopped.  She VU that the pool is clearly a good management tool for her and she would benefit from pool access going forward.  She is considering [employer] or [facility].  She does return to land based intervention next session.  I would like to see her in the pool x 1-2 more times if able to assist with pain reduction and establish final aquatic HEP.  She tolerates session fair with dialed back intensity which allows for reduction of pain 2-3 NPRS with decreased spasm. Goals ongoing    From initial evaluation:  Patient is a 77 y.o. female with dx's of bilateral leg pain and spinal stenosis of lumbar region without neurogenic claudication.  Pt has had back pain for years and leg weakness for at least 3-5 years though began having shooting pain down legs in late July.  Pt has increased pain with ambulation, sitting, and standing.  She is limited with standing duration due to pain which impairs her ability to perform household chores including cooking.  Her worst pain is with carrying/lifting objects including carrying groceries.  She has increased pain with grocery shopping and has difficulty straightening up.  She has weakness in bilat LE's.  Pt should benefit from skilled  PT services to address impairments and to improve overall function.     OBJECTIVE IMPAIRMENTS: Abnormal gait, decreased activity tolerance, decreased endurance, decreased mobility, difficulty walking, decreased strength, and pain.   ACTIVITY LIMITATIONS: carrying, lifting, sitting, standing, and locomotion level  PARTICIPATION LIMITATIONS: meal prep, cleaning, shopping, and community activity  PERSONAL FACTORS: Time since onset of injury/illness/exacerbation and 3+ comorbidities: Osteoporosis, COPD, CKD, arthritis, L TKR, cervical surgery, MI in 2001 and 2017  are also affecting patient's functional outcome.   REHAB POTENTIAL: Good  CLINICAL DECISION MAKING: Evolving/moderate complexity  EVALUATION COMPLEXITY: Moderate   GOALS:  SHORT TERM GOALS: Target date: 11/20/2022   Pt will be independent and compliant with HEP for improved pain, strength, tolerance to activity, and function.  Baseline: not assigned yet -11/20/22 Goal status: In progress   2.  Pt will report at least a 25% improvement in pain and sx's overall.  Baseline:  Goal status: MET - 11/20/22  3.  Pt will report improved tolerance with standing including increased time standing to cook with reduced pain.  Baseline: has been avoiding cooking -11/20/22 Goal status: In progress    LONG TERM GOALS: Target date: 12/11/2022  Pt will ambulate in grocery store without significant pain and improved posture.  Baseline:  Goal status: INITIAL  2.  Pt will demo improved LE strength to at least 4/5 in bilat hip flexion, 5/5 in bilat knee ext, 4/5 in R knee flexion and 25# in bilat hip abduction for improved tolerance with and performance of functional mobility.  Baseline:  Goal status: INITIAL  3.  Pt will report at least a 70% improvement in pain with cooking and carrying groceries.   Baseline:  Goal status: INITIAL  4.  Pt will report improved stability  with gait including turning.  Baseline:  Goal status:  INITIAL     PLAN:  PT FREQUENCY: 1-2x/week  PT DURATION: 6 weeks  PLANNED INTERVENTIONS: Therapeutic exercises, Therapeutic activity, Neuromuscular re-education, Balance training, Gait training, Patient/Family education, Self Care, Stair training, DME instructions, Aquatic Therapy, Dry Needling, Cryotherapy, Moist heat, Vasopneumatic device, Ultrasound, Manual therapy, and Re-evaluation.  PLAN FOR NEXT SESSION: core and LE strengthening.  Gait and balance training.  Corrie Dandy Dudley) Joycelyn Liska MPT 11/28/22 12:17 PM [facility] [address] [place], [place], [address] Phone: [phone]   Fax:  [phone]

## 2022-12-02 ENCOUNTER — Encounter (HOSPITAL_BASED_OUTPATIENT_CLINIC_OR_DEPARTMENT_OTHER): Payer: Self-pay | Admitting: Physical Therapy

## 2022-12-02 ENCOUNTER — Ambulatory Visit (HOSPITAL_BASED_OUTPATIENT_CLINIC_OR_DEPARTMENT_OTHER): Payer: PPO | Admitting: Physical Therapy

## 2022-12-02 ENCOUNTER — Ambulatory Visit (INDEPENDENT_AMBULATORY_CARE_PROVIDER_SITE_OTHER): Payer: PPO | Admitting: Family Medicine

## 2022-12-02 ENCOUNTER — Encounter (HOSPITAL_BASED_OUTPATIENT_CLINIC_OR_DEPARTMENT_OTHER): Payer: Self-pay | Admitting: Family Medicine

## 2022-12-02 VITALS — BP 108/62 | HR 72 | Ht 62.5 in | Wt 214.8 lb

## 2022-12-02 DIAGNOSIS — Z7984 Long term (current) use of oral hypoglycemic drugs: Secondary | ICD-10-CM

## 2022-12-02 DIAGNOSIS — M150 Primary generalized (osteo)arthritis: Secondary | ICD-10-CM | POA: Diagnosis not present

## 2022-12-02 DIAGNOSIS — Z9884 Bariatric surgery status: Secondary | ICD-10-CM

## 2022-12-02 DIAGNOSIS — E1122 Type 2 diabetes mellitus with diabetic chronic kidney disease: Secondary | ICD-10-CM | POA: Diagnosis not present

## 2022-12-02 DIAGNOSIS — M6281 Muscle weakness (generalized): Secondary | ICD-10-CM | POA: Diagnosis not present

## 2022-12-02 DIAGNOSIS — Z859 Personal history of malignant neoplasm, unspecified: Secondary | ICD-10-CM | POA: Insufficient documentation

## 2022-12-02 DIAGNOSIS — N1831 Chronic kidney disease, stage 3a: Secondary | ICD-10-CM | POA: Diagnosis not present

## 2022-12-02 DIAGNOSIS — L219 Seborrheic dermatitis, unspecified: Secondary | ICD-10-CM | POA: Insufficient documentation

## 2022-12-02 DIAGNOSIS — M546 Pain in thoracic spine: Secondary | ICD-10-CM

## 2022-12-02 MED ORDER — HYDROCODONE-ACETAMINOPHEN 10-325 MG PO TABS
1.0000 | ORAL_TABLET | Freq: Every day | ORAL | 0 refills | Status: DC | PRN
Start: 2022-12-02 — End: 2022-12-10

## 2022-12-02 NOTE — Assessment & Plan Note (Signed)
Given history of gastric bypass, we will proceed with laboratory evaluation as below to assess for any deficiencies or abnormalities related to impact on GI tract.  She does not currently have any concerns or symptoms

## 2022-12-02 NOTE — Therapy (Addendum)
 OUTPATIENT PHYSICAL THERAPY THORACOLUMBAR TREATMENT  PHYSICAL THERAPY DISCHARGE SUMMARY  Visits from Start of Care: 7  Current functional level related to goals / functional outcomes: improving   Remaining deficits: pain   Education / Equipment: Management of condition/HEP   Patient agrees to discharge. Patient goals were partially met. Patient is being discharged due to not returning since the last visit.  Addend Corrie Dandy Tomma Lightning) Ziemba MPT 04/09/23 11:11 AM [facility] [address] [place], [place], [address] Phone: [phone]   Fax:  [phone]   Patient Name: Tricia [name] MRN: [id number] DOB:[birth year], 77 y.o., female Today's Date: 12/02/2022  END OF SESSION:  PT End of Session - 12/02/22 0930     Visit Number 7    Number of Visits 10    Date for PT Re-Evaluation 12/11/22    Authorization Type HTA    PT Start Time 0930    PT Stop Time 1010    PT Time Calculation (min) 40 min    Activity Tolerance Patient tolerated treatment well    Behavior During Therapy [facility] for tasks assessed/performed              Past Medical History:  Diagnosis Date   Adenomatous polyp of colon 02/2004   Allergy 2000   Sulfur   Anemia    Anxiety    Arthritis    B12 deficiency    CAD (coronary artery disease)    a. s/p remote BMS to LAD;  b. 09/2015 Inf STEMI/VF Arrest: LM nl, LAD 85p (staged PCI 2 days later w/ 3.0x32 Synergy DES), 40p/m, 25d, LCX 72m, RCA 80ost/100p (2.75x32 Synergy DES), 66m, EF 55-65%. // c. Myoview 2/18: EF 59, no ischemia or infarction; Normal study // Myoview 3/22: EF 70, no ischemia or infarction; low risk   Chronic kidney disease, stage 3, mod decreased GFR (HCC) 03/09/2020   COPD (chronic obstructive pulmonary disease) (HCC) 2018   Chest X-ray   Dermatophytosis of groin and perianal area    Diet Controlled Diabetes Mellitus    Diverticulosis    GERD (gastroesophageal reflux disease)    H/O  echocardiogram    a. 09/2015 Echo: EF 60-65%, no rwma, mild AI, mildly dil RA, mild to mod TR.   History of echocardiogram    Echo 3/22: EF 60-65, no RWMA, GR 1 DD, normal RVSF, mild LAE, RVSP 35.5, mild to moderate TR, trivial AI   Hyperlipidemia    Hypertension    Hypertensive heart disease    Low back pain    l5 disc   Lumbar spinal stenosis 03/03/2019   Morbid obesity (HCC)    Myocardial infarction (HCC) 2001, 2017   Neuromuscular disorder (HCC)    Osteoporosis    PVC's (premature ventricular contractions) 04/16/2017   Holter 3/19: NSR, average heart rate 69, frequent PVCs (5% total beats), rare supraventricular ectopics, no AF/flutter   Sleep apnea 10/03/2009   Resolved after gastric bypass     TOBACCO ABUSE 10/05/2009   Qualifier: Diagnosis of  By: Jens Som, MD, Lyn Hollingshead    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 In setting of inferior STEMI.   Past Surgical History:  Procedure Laterality Date   ABDOMINAL HYSTERECTOMY     APPENDECTOMY     BARIATRIC SURGERY     CARDIAC CATHETERIZATION N/A 10/01/2015   Procedure: Left Heart Cath and Coronary Angiography;  Surgeon: Marykay Lex, MD;  Location: [facility] INVASIVE CV LAB;  Service: Cardiovascular;  Laterality: N/A;  CARDIAC CATHETERIZATION N/A 10/01/2015   Procedure: Coronary Stent Intervention;  Surgeon: Marykay Lex, MD;  Location: [facility] INVASIVE CV LAB;  Service: Cardiovascular;  Laterality: N/A;   CARDIAC CATHETERIZATION N/A 10/03/2015   Procedure: Coronary Stent Intervention;  Surgeon: Lennette Bihari, MD;  Location: [facility] INVASIVE CV LAB;  Service: Cardiovascular;  Laterality: N/A;   CARDIAC CATHETERIZATION N/A 10/03/2015   Procedure: Coronary/Graft Angiography;  Surgeon: Lennette Bihari, MD;  Location: [facility] INVASIVE CV LAB;  Service: Cardiovascular;  Laterality: N/A;   CARPAL TUNNEL RELEASE Bilateral    CERVICAL DISC SURGERY     CHOLECYSTECTOMY     CORONARY ANGIOPLASTY  2002   EYE SURGERY  2020 January    Cataracts   HERNIA REPAIR     JOINT REPLACEMENT  2003   Knee   LUMBAR LAMINECTOMY     L3-4   NEPHRECTOMY Right    Partial   SPINE SURGERY     C1   TONSILLECTOMY     TOTAL KNEE ARTHROPLASTY Left    TUBAL LIGATION     ULNAR NERVE REPAIR Left    Patient Active Problem List   Diagnosis Date Noted   History of malignant neoplasm 12/02/2022   Seborrheic dermatitis 12/02/2022   Lower extremity pain 09/01/2022   Dysuria 04/11/2022   Shortness of breath 06/10/2021   Chronic scapular pain 12/11/2020   Poor balance 09/12/2020   Diabetes mellitus (HCC) 08/22/2020   Chronic kidney disease, stage 3, mod decreased GFR (HCC) 03/09/2020   History of partial nephrectomy 09/08/2019   Laryngopharyngeal reflux (LPR) 09/06/2019   Lumbar spinal stenosis 03/03/2019   Lower extremity edema 11/21/2018   PVC's (premature ventricular contractions) 04/16/2017   Hyperparathyroidism, secondary (HCC) 07/06/2016   COPD (chronic obstructive pulmonary disease) (HCC) 04/19/2016   Coronary artery disease involving native coronary artery of native heart without angina pectoris 03/10/2016   History of cardiac arrest 10/01/2015   History of ST elevation myocardial infarction (STEMI) 10/01/2015   GERD (gastroesophageal reflux disease), Rx Protonix 02/14/2014   Hx of gastric bypass 02/14/2014   Former smoker, 50 pack years, quit 2010 02/14/2014   Anemia, B12 deficiency 04/04/2009   Osteoporosis 12/14/2006   Hyperlipidemia associated with type 2 diabetes mellitus (HCC) 09/22/2006   Hypertension associated with diabetes (HCC) 09/22/2006   Osteoarthritis, multiple sites 09/22/2006   Degenerative joint disease (DJD) of lumbar spine 09/22/2006   Adenomatous polyp of colon 02/04/2004     REFERRING PROVIDER: Glendale Chard, DO  REFERRING DIAG: M79.604,M79.605 (ICD-10-CM) - Bilateral leg pain             M48.061 (ICD-10-CM) - Spinal stenosis of lumbar region without neurogenic claudication  Rationale for  Evaluation and Treatment: Rehabilitation  THERAPY DIAG:  Muscle weakness (generalized)  Pain in thoracic spine  ONSET DATE: Chronic back pain and leg weakness ; July pain in lower extremities   SUBJECTIVE:  SUBJECTIVE STATEMENT: Patient states feeling so so, pretty good today. Has been doing aquatics. Still gets spasms in mid back though.    FROM EVAL: Pt states she has had back pain for years and leg weakness for at least 3-5 years.  Pt began having shooting pain down les in late July.  Sx's occur mostly when sitting or standing.  PT order indicated Leg Strengthening She has history of lumbar canal stenosis at L4-5.She has history of lumbar spinal stenosis at L4-5   Pt states neurosurgeon has told her she needed surgery for years.  Pt has a quad cane and rollator.  She prefers the rollator over the cane, but doesn't always use an AD.  Pt pushes a cart with grocery shopping.  She has increased pain with grocery shopping and has difficulty straightening up.  Pt states she leans forward when having pain with ambulation.  Pt states she has shooting pain "whenever it decides to come".  Pt can have shooting pains down leg in sitting, standing, or lying.  Worst pain is with carrying/lifting objects including carrying groceries.  Pt is limited with standing duration which impairs her ability to cook.  Pt had PT approx 4 years ago and reports it didn't hlep.    PERTINENT HISTORY:  Per dx.  spondylolisthesis at L4-5 Osteoporosis, COPD, HTN, CAD, CKD, arthritis, MI in 2001 and 2017 Cervical surgery including on C1, gastric bypass surgery, L TKR, partial nephrectomy, Bladder incontinence--wear pads  PAIN:  Are you in pain?  Yes  NPRS:  0/10  Location: Lt shoulder/upper trap    PRECAUTIONS: Other: osteoporosis,  spondylolisthesis, L TKR, cervical surgery   WEIGHT BEARING RESTRICTIONS: No  FALLS:  Has patient fallen in last 6 months? No  LIVING ENVIRONMENT: Lives with: lives alone Lives in: 1 story home Has following equipment at home: rollator, quad cane   PLOF: IndependentPt has had chronic back pain and leg weakness which has affected her daily activities and functional mobility.   PATIENT GOALS: stand for 1 hour, to be pain free    OBJECTIVE:   DIAGNOSTIC FINDINGS:  02/24/17 Study: Lumbar spine x-rays. Views: AP, lateral, flexion, extension, obliques, cone-down lumbosacral junction, AP thoracic (7 lumbar, 1 thoracic view). Indications: Right lumbar radiculopathy. Findings: The patient has a degenerative thoracolumbar scoliosis that appears to have progressed some over the past 4 years. There is a grade 1 minimally dynamic spondylolisthesis L4-5 that does not appear significantly changed from 4 years ago. There is significant L4-5 facet arthropathy presumably responsible for the spondylolisthesis.    CT myelogram 02/11/2013: 1. Small central disc protrusion L2-3 without significant  compressive pathology.  2. Mild multifactorial spinal stenosis L3-4.  3. Advanced facet disease L4-5 allowing grade 1 anterolisthesis  without dynamic instability, and resulting in moderate central canal  stenosis.  4. Asymmetric facet degenerative change L5-S1, left worse than right.   Lumbar x ray in 2019: IMPRESSION: Stable spondylolisthesis at L4-5. No fracture. Lower lumbar scoliosis, stable. Mild disc space narrowing L4-5.   There is aortic atherosclerosis.  PATIENT SURVEYS:  FOTO 40 with a goal of 48 at visit 12  SCREENING FOR RED FLAGS: Bowel or bladder incontinence: Yes: bladder wear pads Spinal tumors: No Cauda equina syndrome: No Compression fracture: No Abdominal aneurysm: No  COGNITION: Overall cognitive status: Within functional limits for tasks  assessed     SENSATION: Bilat LE dermatomes 2+ t/o LT except L4 on R LE 1+ and L5 on L LE 1+  LOWER EXTREMITY MMT:    MMT Right eval Left eval  Hip flexion Tolerates slight resistance Tolerates min resistance  Hip extension    Hip abduction 16.1 18.2  Hip adduction    Hip internal rotation    Hip external rotation    Knee flexion Unable to tolerate resistance (seated) 4+/5 (seated)  Knee extension 4-/5 4/5  Ankle dorsiflexion 4/5 5/5  Ankle plantarflexion WFL seated WFL seated  Ankle inversion    Ankle eversion     (Blank rows = not tested)   GAIT:  Comments: slow gait speed.  Bilat toe out.  Pt ambulated without AD and had a LOB when turning and used the wall for LOB.   TODAY'S TREATMENT:                                                                                                                              12/02/22 Seated trunk flexion stretch with green ball 10 x 5 second holds Hooklying march with ab set 1 x 10 bilateral Supine DKTC with green ball 1 x 10 with 5 second holds Supine bilateral shoulder ER RTB 1 x 10  Supine shoulder horizontal abduction RTB 1 x 10 Supine shoulder PNF D2 RTB 1 x 10 LTR 1 x 10 with 5 second holds bilateral Standing Row RTB 2 x 10 Standing shoulder extension RTB 1 x 10  Standing hip abduction 1 x 10 bilateral  Standing alternating march 1 x 10 bilateral  Seated thoracic extension stretch over chair 1 x 10   11/28/22  Pt seen for aquatic therapy today.  Treatment took place in water 3.5-4.75 ft in depth at the [facility] pool. Temp of water was 91.  Pt entered/exited the pool via stairs independently with bilat rail.  *with noodle under arms: walking forward  /backward;side stepping multiple widths *decompression using yellow noodle wrapped posteriorly then anteriorly across chest: cycling; hip abdct/add; knees to chest *HB carry rainbow forward and back x2 widths ea * tricep press down with rainbow hand floats  2x5 * staggered stance with bilat shoulder horiz abdct/ addct x 5, each LE forward * TrA set with short hollow noodle x 10 in wide stance  Pt requires the buoyancy and hydrostatic pressure of water for support, and to offload joints by unweighting joint load by at least 50 % in navel deep water and by at least 75-80% in chest to neck deep water.  Viscosity of the water is needed for resistance of strengthening. Water current perturbations provides challenge to standing balance requiring increased core activation.    PATIENT EDUCATION:  Education details:  aquatic therapy introduction 10/29 HEP Person educated: Patient Education method: Explanation Education comprehension: verbalized understanding and needs further education  HOME EXERCISE PROGRAM: Access Code: [email or web address] URL: [email or web address] Date: 11/28/2022 Prepared by: Geni Bers  This aquatic home exercise program from [employer] utilizes pictures from land based exercises, but has been adapted prior to lamination and issuance.  Exercises - Hand Buoy Carry  - Noodle press  - 1 x daily - 1-3 x weekly - 1-2 sets - 10 reps - Seated Straddle on Flotation Forward Breast Stroke Arms and Bicycle Legs  - Standing Shoulder Horizontal Abduction with Resistance  - 1 x daily - 1-3 x weekly - 1-2 sets - 10 reps - Drawing Bow  - 1 x daily - 1-3 x weekly - 1-2 sets - 10 reps - Standing Hip Abduction Adduction at [employer]  - 1 x daily - 1-3 x weekly - 1-2 sets - 10 reps - Standing Hip Flexion Extension at [employer]  - 1 x daily - 1-3 x weekly - 1-2 sets - 10 reps - Heel Toe Raises at Pool Wall  - 1 x daily - 1-3 x weekly - 1-2 sets - 10 reps - Flutter Kicking/Windshield Wipers  - 1 x daily - 1-3 x weekly - 1-2 sets - 10 reps Not issued  12/02/22- Supine Shoulder External Rotation with Resistance  - 1 x daily - 7 x weekly - 2 sets - 10 reps - Supine Shoulder Horizontal Abduction with Resistance  - 1 x daily - 7 x weekly -  2 sets - 10 reps - Standing Shoulder Single Arm PNF D2 Flexion with Resistance  - 1 x daily - 7 x weekly - 2 sets - 10 reps  ASSESSMENT:  CLINICAL IMPRESSION: Began land based exercises today which are tolerated well. Core, hip and postural strengthening performed with good mechanics. Patient will continue to benefit from physical therapy in order to improve function and reduce impairment.     From initial evaluation:  Patient is a 77 y.o. female with dx's of bilateral leg pain and spinal stenosis of lumbar region without neurogenic claudication.  Pt has had back pain for years and leg weakness for at least 3-5 years though began having shooting pain down legs in late July.  Pt has increased pain with ambulation, sitting, and standing.  She is limited with standing duration due to pain which impairs her ability to perform household chores including cooking.  Her worst pain is with carrying/lifting objects including carrying groceries.  She has increased pain with grocery shopping and has difficulty straightening up.  She has weakness in bilat LE's.  Pt should benefit from skilled PT services to address impairments and to improve overall function.     OBJECTIVE IMPAIRMENTS: Abnormal gait, decreased activity tolerance, decreased endurance, decreased mobility, difficulty walking, decreased strength, and pain.   ACTIVITY LIMITATIONS: carrying, lifting, sitting, standing, and locomotion level  PARTICIPATION LIMITATIONS: meal prep, cleaning, shopping, and community activity  PERSONAL FACTORS: Time since onset of injury/illness/exacerbation and 3+ comorbidities: Osteoporosis, COPD, CKD, arthritis, L TKR, cervical surgery, MI in 2001 and 2017  are also affecting patient's functional outcome.   REHAB POTENTIAL: Good  CLINICAL DECISION MAKING: Evolving/moderate complexity  EVALUATION COMPLEXITY: Moderate   GOALS:  SHORT TERM GOALS: Target date: 11/20/2022   Pt will be independent and compliant  with HEP for improved pain, strength, tolerance to activity, and function.  Baseline: not assigned yet -11/20/22 Goal status: In progress   2.  Pt will report at least a 25% improvement in pain and sx's overall.  Baseline:  Goal status: MET - 11/20/22  3.  Pt will report improved tolerance with standing including increased time standing to cook with reduced pain.  Baseline: has been avoiding cooking -11/20/22 Goal status: In progress    LONG TERM GOALS: Target date: 12/11/2022  Pt will ambulate in grocery store without significant pain and improved posture.  Baseline:  Goal status: INITIAL  2.  Pt will demo improved LE strength to at least 4/5 in bilat hip flexion, 5/5 in bilat knee ext, 4/5 in R knee flexion and 25# in bilat hip abduction for improved tolerance with and performance of functional mobility.  Baseline:  Goal status: INITIAL  3.  Pt will report at least a 70% improvement in pain with cooking and carrying groceries.   Baseline:  Goal status: INITIAL  4.  Pt will report improved stability with gait including turning.  Baseline:  Goal status: INITIAL     PLAN:  PT FREQUENCY: 1-2x/week  PT DURATION: 6 weeks  PLANNED INTERVENTIONS: Therapeutic exercises, Therapeutic activity, Neuromuscular re-education, Balance training, Gait training, Patient/Family education, Self Care, Stair training, DME instructions, Aquatic Therapy, Dry Needling, Cryotherapy, Moist heat, Vasopneumatic device, Ultrasound, Manual therapy, and Re-evaluation.  PLAN FOR NEXT SESSION: core and LE strengthening.  Gait and balance training.   Wyman Songster, PT 12/02/2022, 10:14 AM  [facility] [address] [place], [place], [address] Phone: [phone]   Fax:  [phone]

## 2022-12-02 NOTE — Progress Notes (Signed)
    Procedures performed today:    None.  Independent interpretation of notes and tests performed by another provider:   None.  Brief History, Exam, Impression, and Recommendations:    BP 108/62 (BP Location: Right Arm, Patient Position: Sitting, Cuff Size: Normal)   Pulse 72   Ht 5' 2.5" (1.588 m)   Wt 214 lb 12.8 oz (97.4 kg)   LMP  (LMP Unknown)   SpO2 98%   BMI 38.66 kg/m   Type 2 diabetes mellitus with stage 3a chronic kidney disease, without long-term current use of insulin (HCC) Assessment & Plan: Patient was able to obtain Ozempic, has been administering 0.5 mg dose for 4 doses now.  Has been tolerating medication well.  Denies any significant GI symptoms.  She also continues with [place].  Prior hemoglobin A1c was at goal at 6.8%.  She is due for hemoglobin A1c checked today We discussed options today and we will look to proceed with dose titration of Ozempic.  Since she is obtaining this through pharmaceutical assistance program, we will need to submit for dose change through pharmaceutical company. We will proceed with hemoglobin A1c check today.  Orders: -     Hemoglobin A1c -     Comprehensive metabolic panel  Primary osteoarthritis involving multiple joints Assessment & Plan: Requesting refill of Norco today.  Reviewed PDMP, prior prescriptions for 30 tablets to be taken as needed.  Has received this medication intermittently over the past multiple years.  No red flags identified on PDMP review.  Discussed potential side effects related to chronic narcotic use.  Refill of medication provided today.  Orders: -     HYDROcodone-Acetaminophen; Take 1 tablet by mouth daily as needed.  Dispense: 30 tablet; Refill: 0  Hx of gastric bypass Assessment & Plan: Given history of gastric bypass, we will proceed with laboratory evaluation as below to assess for any deficiencies or abnormalities related to impact on GI tract.  She does not currently have any concerns or  symptoms  Orders: -     B12 and Folate Panel -     Iron, TIBC and Ferritin Panel -     VITAMIN D 25 Hydroxy (Vit-D Deficiency, Fractures) -     Comprehensive metabolic panel  Primary osteoarthritis involving multiple joints Assessment & Plan: Requesting refill of Norco today.  Reviewed PDMP, prior prescriptions for 30 tablets to be taken as needed.  Has received this medication intermittently over the past multiple years.  No red flags identified on PDMP review.  Discussed potential side effects related to chronic narcotic use.  Refill of medication provided today.  Orders: -     HYDROcodone-Acetaminophen; Take 1 tablet by mouth daily as needed.  Dispense: 30 tablet; Refill: 0  Return in about 3 months (around 03/04/2023) for diabetes, med check.   ___________________________________________ Zhoey Blackstock de [place], MD, ABFM, CAQSM [facility]

## 2022-12-02 NOTE — Assessment & Plan Note (Signed)
Requesting refill of Norco today.  Reviewed PDMP, prior prescriptions for 30 tablets to be taken as needed.  Has received this medication intermittently over the past multiple years.  No red flags identified on PDMP review.  Discussed potential side effects related to chronic narcotic use.  Refill of medication provided today.

## 2022-12-02 NOTE — Assessment & Plan Note (Addendum)
Patient was able to obtain Ozempic, has been administering 0.5 mg dose for 4 doses now.  Has been tolerating medication well.  Denies any significant GI symptoms.  She also continues with [place].  Prior hemoglobin A1c was at goal at 6.8%.  She is due for hemoglobin A1c checked today We discussed options today and we will look to proceed with dose titration of Ozempic.  Since she is obtaining this through pharmaceutical assistance program, we will need to submit for dose change through pharmaceutical company. We will proceed with hemoglobin A1c check today.

## 2022-12-03 LAB — COMPREHENSIVE METABOLIC PANEL
ALT: 17 [IU]/L (ref 0–32)
AST: 18 [IU]/L (ref 0–40)
Albumin: 4 g/dL (ref 3.8–4.8)
Alkaline Phosphatase: 191 [IU]/L — ABNORMAL HIGH (ref 44–121)
BUN/Creatinine Ratio: 17 (ref 12–28)
BUN: 16 mg/dL (ref 8–27)
Bilirubin Total: 0.4 mg/dL (ref 0.0–1.2)
CO2: 24 mmol/L (ref 20–29)
Calcium: 9.7 mg/dL (ref 8.7–10.3)
Chloride: 102 mmol/L (ref 96–106)
Creatinine, Ser: 0.95 mg/dL (ref 0.57–1.00)
Globulin, Total: 2 g/dL (ref 1.5–4.5)
Glucose: 110 mg/dL — ABNORMAL HIGH (ref 70–99)
Potassium: 4.6 mmol/L (ref 3.5–5.2)
Sodium: 140 mmol/L (ref 134–144)
Total Protein: 6 g/dL (ref 6.0–8.5)
eGFR: 62 mL/min/{1.73_m2} (ref >59)

## 2022-12-03 LAB — IRON,TIBC AND FERRITIN PANEL
Ferritin: 14 ng/mL — ABNORMAL LOW (ref 15–150)
Iron Saturation: 21 % (ref 15–55)
Iron: 77 ug/dL (ref 27–139)
Total Iron Binding Capacity: 363 ug/dL (ref 250–450)
UIBC: 286 ug/dL (ref 118–369)

## 2022-12-03 LAB — VITAMIN D 25 HYDROXY (VIT D DEFICIENCY, FRACTURES): Vit D, 25-Hydroxy: 46.2 ng/mL (ref 30.0–100.0)

## 2022-12-03 LAB — B12 AND FOLATE PANEL
Folate: 6.3 ng/mL (ref >3.0)
Vitamin B-12: 418 pg/mL (ref 232–1245)

## 2022-12-03 LAB — HEMOGLOBIN A1C
Est. average glucose Bld gHb Est-mCnc: 131 mg/dL
Hgb A1c MFr Bld: 6.2 % — ABNORMAL HIGH (ref 4.8–5.6)

## 2022-12-04 ENCOUNTER — Encounter (HOSPITAL_BASED_OUTPATIENT_CLINIC_OR_DEPARTMENT_OTHER): Payer: Self-pay | Admitting: Family Medicine

## 2022-12-04 DIAGNOSIS — M150 Primary generalized (osteo)arthritis: Secondary | ICD-10-CM

## 2022-12-05 ENCOUNTER — Ambulatory Visit (HOSPITAL_BASED_OUTPATIENT_CLINIC_OR_DEPARTMENT_OTHER): Payer: PPO | Admitting: Physical Therapy

## 2022-12-08 ENCOUNTER — Other Ambulatory Visit (HOSPITAL_BASED_OUTPATIENT_CLINIC_OR_DEPARTMENT_OTHER): Payer: Self-pay | Admitting: *Deleted

## 2022-12-08 ENCOUNTER — Telehealth (HOSPITAL_BASED_OUTPATIENT_CLINIC_OR_DEPARTMENT_OTHER): Payer: Self-pay | Admitting: *Deleted

## 2022-12-08 NOTE — Telephone Encounter (Signed)
Called pharmacy regarding hydrocodone. Pharmacy stated last fill was in June and without having it filled for 90 days there he could only fill a 7 day supply. After this he could pick up with the 30 days supply. He did not say he needed a new order but FYI in case pt needs to start 30 days supply after 7 day pick up she plans to pick up 11/5

## 2022-12-09 ENCOUNTER — Ambulatory Visit (HOSPITAL_BASED_OUTPATIENT_CLINIC_OR_DEPARTMENT_OTHER): Payer: PPO | Admitting: Physical Therapy

## 2022-12-10 MED ORDER — HYDROCODONE-ACETAMINOPHEN 10-325 MG PO TABS: 1.0000 | ORAL_TABLET | Freq: Every day | ORAL | 0 refills | Status: DC | PRN

## 2022-12-12 ENCOUNTER — Encounter (HOSPITAL_BASED_OUTPATIENT_CLINIC_OR_DEPARTMENT_OTHER): Payer: Self-pay | Admitting: Family Medicine

## 2022-12-12 ENCOUNTER — Other Ambulatory Visit (HOSPITAL_BASED_OUTPATIENT_CLINIC_OR_DEPARTMENT_OTHER): Payer: Self-pay | Admitting: *Deleted

## 2022-12-12 ENCOUNTER — Ambulatory Visit (HOSPITAL_BASED_OUTPATIENT_CLINIC_OR_DEPARTMENT_OTHER): Payer: PPO | Admitting: Physical Therapy

## 2022-12-12 DIAGNOSIS — N183 Chronic kidney disease, stage 3 unspecified: Secondary | ICD-10-CM

## 2022-12-12 DIAGNOSIS — J449 Chronic obstructive pulmonary disease, unspecified: Secondary | ICD-10-CM

## 2022-12-12 DIAGNOSIS — E1169 Type 2 diabetes mellitus with other specified complication: Secondary | ICD-10-CM

## 2022-12-12 DIAGNOSIS — I2510 Atherosclerotic heart disease of native coronary artery without angina pectoris: Secondary | ICD-10-CM

## 2022-12-12 DIAGNOSIS — I152 Hypertension secondary to endocrine disorders: Secondary | ICD-10-CM

## 2022-12-12 DIAGNOSIS — E1121 Type 2 diabetes mellitus with diabetic nephropathy: Secondary | ICD-10-CM

## 2022-12-12 MED ORDER — FREESTYLE LIBRE 3 SENSOR MISC: 1.0000 [IU] | 11 refills | Status: DC

## 2022-12-29 ENCOUNTER — Other Ambulatory Visit (HOSPITAL_BASED_OUTPATIENT_CLINIC_OR_DEPARTMENT_OTHER): Payer: Self-pay | Admitting: *Deleted

## 2022-12-29 DIAGNOSIS — N2581 Secondary hyperparathyroidism of renal origin: Secondary | ICD-10-CM

## 2022-12-29 MED ORDER — VITAMIN D 50 MCG (2000 UT) PO TABS
2000.0000 [IU] | ORAL_TABLET | Freq: Every day | ORAL | 1 refills | Status: DC
  Filled 2023-03-16: qty 30, 30d supply, fill #0
  Filled 2023-03-16 – 2023-03-19 (×3): qty 90, 90d supply, fill #0
  Filled 2023-03-23: qty 30, 30d supply, fill #0
  Filled 2023-04-06 – 2023-04-15 (×2): qty 30, 30d supply, fill #1
  Filled 2023-04-24 – 2023-05-12 (×2): qty 30, 30d supply, fill #2
  Filled 2023-05-26 – 2023-06-09 (×2): qty 30, 30d supply, fill #3
  Filled 2023-06-26 – 2023-07-28 (×3): qty 30, 30d supply, fill #4
  Filled 2023-08-20 – 2023-08-25 (×3): qty 30, 30d supply, fill #5

## 2023-01-06 ENCOUNTER — Other Ambulatory Visit (HOSPITAL_BASED_OUTPATIENT_CLINIC_OR_DEPARTMENT_OTHER): Payer: Self-pay | Admitting: Family Medicine

## 2023-01-13 ENCOUNTER — Ambulatory Visit (HOSPITAL_BASED_OUTPATIENT_CLINIC_OR_DEPARTMENT_OTHER): Payer: PPO | Admitting: *Deleted

## 2023-01-13 ENCOUNTER — Encounter (HOSPITAL_BASED_OUTPATIENT_CLINIC_OR_DEPARTMENT_OTHER): Payer: Self-pay

## 2023-01-13 DIAGNOSIS — Z0000 Encounter for general adult medical examination without abnormal findings: Secondary | ICD-10-CM | POA: Diagnosis not present

## 2023-01-13 NOTE — Progress Notes (Signed)
Subjective:   Tricia [name] is a 77 y.o. female who presents for Medicare Annual (Subsequent) preventive examination.  Visit Complete: Virtual I connected with  Tricia [name] on 01/13/23 by a audio enabled telemedicine application and verified that I am speaking with the correct person using two identifiers.  Patient Location: Home  Provider Location: Home Office  I discussed the limitations of evaluation and management by telemedicine. The patient expressed understanding and agreed to proceed.  Vital Signs: Because this visit was a virtual/telehealth visit, some criteria may be missing or patient reported. Any vitals not documented were not able to be obtained and vitals that have been documented are patient reported.  Patient Medicare AWV questionnaire was completed by the patient on 01-09-2023; I have confirmed that all information answered by patient is correct and no changes since this date.  Cardiac Risk Factors include: advanced age (>69men, >67 women);diabetes mellitus;hypertension;family history of premature cardiovascular disease     Objective:    There were no vitals filed for this visit. There is no height or weight on file to calculate BMI.     01/13/2023    8:16 AM 10/30/2022    2:39 PM 10/01/2022    8:21 AM 09/08/2020    7:22 PM 09/06/2020    8:13 AM 09/01/2019    8:06 AM 09/02/2018    6:52 AM  Advanced Directives  Does Patient Have a Medical Advance Directive? Yes Yes Yes Yes Yes Yes Yes  Type of Estate agent of [employer] Power of [place];Living will Healthcare Power of [place];Living will Living will Living will Healthcare Power of [place];Living will Healthcare Power of [place];Living will  Does patient want to make changes to medical advance directive?       Yes (MAU/Ambulatory/Procedural Areas - Information given)  Copy of Healthcare Power of Attorney in Chart? No - copy requested     No - copy requested No - copy requested     Current Medications (verified) Outpatient Encounter Medications as of 01/13/2023  Medication Sig   amLODipine (NORVASC) 10 MG tablet Take 1 tablet (10 mg total) by mouth daily.   aspirin EC 81 MG tablet Take 81 mg by mouth daily.   Blood Glucose Monitoring Suppl DEVI 1 each by Does not apply route in the morning, at noon, and at bedtime. May substitute to any manufacturer covered by patient's insurance. Patient states they use One Touch Verio Reflect.   carvedilol (COREG) 6.25 MG tablet Take 1 tablet (6.25 mg total) by mouth 2 (two) times daily with a meal.   Cholecalciferol (VITAMIN D) 50 MCG (2000 UT) tablet Take 1 tablet (2,000 Units total) by mouth daily.   clopidogrel (PLAVIX) 75 MG tablet Take 1 tablet (75 mg total) by mouth daily.   Continuous Glucose Sensor (FREESTYLE LIBRE 3 SENSOR) MISC 1 Units by Does not apply route every 14 (fourteen) days.   dapagliflozin propanediol (FARXIGA) 10 MG TABS tablet Take 1 tablet (10 mg total) by mouth daily.   ezetimibe-simvastatin (VYTORIN) 10-80 MG tablet Take 1 tablet by mouth at bedtime.   furosemide (LASIX) 20 MG tablet Take 1-2 tablets (20-40 mg total) by mouth as needed for fluid or edema.   HYDROcodone-acetaminophen (NORCO) 10-325 MG tablet Take 1 tablet by mouth daily as needed.   nitroGLYCERIN (NITROLINGUAL) 0.4 MG/SPRAY spray Place 1 spray under the tongue every five minutes for 3 doses as needed for chest pain.   ONETOUCH VERIO test strip USE AS DIRECTED IN THE MORNING,  AT NOON, AND AT BEDTIME   pantoprazole (PROTONIX) 40 MG tablet Take 1 tablet (40 mg total) by mouth 2 (two) times daily before a meal.   potassium chloride (KLOR-CON) 10 MEQ tablet TAKE ONE TABLET BY MOUTH WHEN YOU TAKE YOUR FUROSEMIDE FOR SWELLING.   PROAIR HFA 108 (90 Base) MCG/ACT inhaler Inhale 2 puffs into the lungs every 6 (six) hours as needed for wheezing or shortness of breath.   Semaglutide,0.25 or 0.5MG /DOS, (OZEMPIC, 0.25 OR 0.5 MG/DOSE,) 2 MG/3ML SOPN Inject  0.5 mg into the skin once a week.   valsartan (DIOVAN) 320 MG tablet Take 1 tablet (320 mg total) by mouth daily.   No facility-administered encounter medications on file as of 01/13/2023.    Allergies (verified) Sulfa antibiotics   History: Past Medical History:  Diagnosis Date   Adenomatous polyp of colon 02/2004   Allergy 2000   Sulfur   Anemia    Anxiety    Arthritis    B12 deficiency    CAD (coronary artery disease)    a. s/p remote BMS to LAD;  b. 09/2015 Inf STEMI/VF Arrest: LM nl, LAD 85p (staged PCI 2 days later w/ 3.0x32 Synergy DES), 40p/m, 25d, LCX 81m, RCA 80ost/100p (2.75x32 Synergy DES), 4m, EF 55-65%. // c. Myoview 2/18: EF 59, no ischemia or infarction; Normal study // Myoview 3/22: EF 70, no ischemia or infarction; low risk   Chronic kidney disease, stage 3, mod decreased GFR (HCC) 03/09/2020   COPD (chronic obstructive pulmonary disease) (HCC) 2018   Chest X-ray   Dermatophytosis of groin and perianal area    Diet Controlled Diabetes Mellitus    Diverticulosis    GERD (gastroesophageal reflux disease)    H/O echocardiogram    a. 09/2015 Echo: EF 60-65%, no rwma, mild AI, mildly dil RA, mild to mod TR.   History of echocardiogram    Echo 3/22: EF 60-65, no RWMA, GR 1 DD, normal RVSF, mild LAE, RVSP 35.5, mild to moderate TR, trivial AI   Hyperlipidemia    Hypertension    Hypertensive heart disease    Low back pain    l5 disc   Lumbar spinal stenosis 03/03/2019   Morbid obesity (HCC)    Myocardial infarction (HCC) 2001, 2017   Neuromuscular disorder (HCC)    Osteoporosis    PVC's (premature ventricular contractions) 04/16/2017   Holter 3/19: NSR, average heart rate 69, frequent PVCs (5% total beats), rare supraventricular ectopics, no AF/flutter   Sleep apnea 10/03/2009   Resolved after gastric bypass     TOBACCO ABUSE 10/05/2009   Qualifier: Diagnosis of  By: Jens Som, MD, Lyn Hollingshead    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 In  setting of inferior STEMI.   Past Surgical History:  Procedure Laterality Date   ABDOMINAL HYSTERECTOMY     APPENDECTOMY     BARIATRIC SURGERY     CARDIAC CATHETERIZATION N/A 10/01/2015   Procedure: Left Heart Cath and Coronary Angiography;  Surgeon: Marykay Lex, MD;  [facility] INVASIVE CV LAB;  Service: Cardiovascular;  Laterality: N/A;   CARDIAC CATHETERIZATION N/A 10/01/2015   Procedure: Coronary Stent Intervention;  Surgeon: Marykay Lex, MD;  Location: [facility] INVASIVE CV LAB;  Service: Cardiovascular;  Laterality: N/A;   CARDIAC CATHETERIZATION N/A 10/03/2015   Procedure: Coronary Stent Intervention;  Surgeon: Lennette Bihari, MD;  Location: [facility] INVASIVE CV LAB;  Service: Cardiovascular;  Laterality: N/A;   CARDIAC CATHETERIZATION N/A 10/03/2015   Procedure: Coronary/Graft Angiography;  Surgeon: Lennette Bihari, MD;  Location: [facility] INVASIVE CV LAB;  Service: Cardiovascular;  Laterality: N/A;   CARPAL TUNNEL RELEASE Bilateral    CERVICAL DISC SURGERY     CHOLECYSTECTOMY     CORONARY ANGIOPLASTY  2002   EYE SURGERY  2020 January   Cataracts   HERNIA REPAIR     JOINT REPLACEMENT  2003   Knee   LUMBAR LAMINECTOMY     L3-4   NEPHRECTOMY Right    Partial   SPINE SURGERY     C1   TONSILLECTOMY     TOTAL KNEE ARTHROPLASTY Left    TUBAL LIGATION     ULNAR NERVE REPAIR Left    Family History  Problem Relation Age of Onset   Colitis Mother        sepsis from c dif colitis   Cancer Mother    COPD Mother    Diabetes Mother    Heart disease Mother    Hyperlipidemia Mother    Hypertension Mother    Heart disease Father    Early death Father    Lung cancer Maternal Uncle    Cancer Maternal Uncle    Stomach cancer Maternal Aunt    Lung cancer Maternal Uncle    Cancer Maternal Grandmother    Hearing loss Paternal Grandfather    Hyperlipidemia Paternal Grandfather    Hypertension Paternal Grandfather    Stroke Paternal Grandfather    Cancer Paternal Grandmother    Cancer  Maternal Aunt    Cancer Maternal Uncle    Cancer Brother    Diabetes Sister    Early death Sister    Learning disabilities Sister    Learning disabilities Daughter    Vision loss Maternal Aunt    Colon cancer Neg Hx    Esophageal cancer Neg Hx    Rectal cancer Neg Hx    Social History   Socioeconomic History   Marital status: Widowed    Spouse name: Not on file   Number of children: Not on file   Years of education: Not on file   Highest education level: Associate degree: occupational, Scientist, product/process development, or vocational program  Occupational History   Occupation: retired    Associate Professor: UNEMPLOYED  Tobacco Use   Smoking status: Former    Current packs/day: 0.00    Average packs/day: 2.0 packs/day for 41.0 years (82.0 ttl pk-yrs)    Types: Cigarettes    Start date: 08/04/1974    Quit date: 08/04/2015    Years since quitting: 7.4    Passive exposure: Past   Smokeless tobacco: Never   Tobacco comments:    Quit 2000 started again 2008, quit again 2011, started again 2014  Vaping Use   Vaping status: Never Used  Substance and Sexual Activity   Alcohol use: No   Drug use: No   Sexual activity: Not Currently    Birth control/protection: Post-menopausal  Other Topics Concern   Not on file  Social History Narrative   Widowed in 04/2012. 2 children. 3 grandkids. Lives alone. Completely indendent. Disabled/retired. Disabled-lifted computer paper. Hobbies: spend money-gamble.         Are you right handed or left handed? Right Handed    Are you currently employed ? No    What is your current occupation?   Do you live at home alone? Yes   Who lives with you?    What type of home do you live in: 1 story or 2 story? Lives in a one story  home        Social Determinants of Health   Financial Resource Strain: Medium Risk (01/13/2023)   Overall Financial Resource Strain (CARDIA)    Difficulty of Paying Living Expenses: Somewhat hard  Food Insecurity: Food Insecurity Present (01/13/2023)    Hunger Vital Sign    Worried About Running Out of Food in the Last Year: Sometimes true    Ran Out of Food in the Last Year: Never true  Transportation Needs: No Transportation Needs (01/13/2023)   PRAPARE - Administrator, Civil Service (Medical): No    Lack of Transportation (Non-Medical): No  Physical Activity: Inactive (01/13/2023)   Exercise Vital Sign    Days of Exercise per Week: 0 days    Minutes of Exercise per Session: 0 min  Stress: No Stress Concern Present (01/13/2023)   [employer] of Occupational Health - Occupational Stress Questionnaire    Feeling of Stress : Only a little  Social Connections: Socially Isolated (01/13/2023)   Social Connection and Isolation Panel [NHANES]    Frequency of Communication with Friends and Family: More than three times a week    Frequency of Social Gatherings with Friends and Family: Once a week    Attends Religious Services: Never    Database administrator or Organizations: No    Attends Banker Meetings: Never    Marital Status: Widowed    Tobacco Counseling Counseling given: Not Answered Tobacco comments: Quit 2000 started again 2008, quit again 2011, started again 2014   Clinical Intake:  Pre-visit preparation completed: Yes  Pain : No/denies pain     Diabetes: Yes CBG done?: No Did pt. bring in CBG monitor from home?: No  How often do you need to have someone help you when you read instructions, pamphlets, or other written materials from your doctor or pharmacy?: 1 - Never  Interpreter Needed?: No  Information entered by :: Remi Haggard LPN   Activities of Daily Living    01/13/2023    8:32 AM 01/09/2023    9:03 AM  In your present state of health, do you have any difficulty performing the following activities:  Hearing? 0 0  Vision? 0 0  Difficulty concentrating or making decisions? 0 0  Walking or climbing stairs? 1 1  Dressing or bathing? 0 0  Doing errands, shopping? 0 0   Preparing Food and eating ? N N  Using the Toilet? N N  In the past six months, have you accidently leaked urine? Y Y  Do you have problems with loss of bowel control? N N  Managing your Medications? N N  Managing your Finances? Malvin Johns  Housekeeping or managing your Housekeeping? Y Y    Patient Care Team: [place], Buren Kos, MD as PCP - General (Family Medicine) Meryl Dare, MD as Consulting Physician (Gastroenterology) Kennon Rounds as Physician Assistant (Cardiology) Helane Gunther, DPM as Consulting Physician (Podiatry) Tonny Bollman, MD as Consulting Physician (Cardiology) Shirlean Kelly, MD as Consulting Physician (Neurosurgery) Dahlia Byes, [facility] (Inactive) as Pharmacist (Pharmacist) Glendale Chard, DO as Consulting Physician (Neurology)  Indicate any recent Medical Services you may have received from other than [facility] providers in the past year (date may be approximate).     Assessment:   This is a routine wellness examination for Zya.  Hearing/Vision screen Hearing Screening - Comments:: No trouble hearing Vision Screening - Comments:: [facility]  Up to date   Goals Addressed  This Visit's Progress    Patient Stated   On track    To lose weight      Patient Stated   On track    Lose weight      Patient Stated       Continue Current lifestyle       Depression Screen    01/13/2023    8:18 AM 12/02/2022    8:47 AM 09/01/2022    8:20 AM 07/01/2022    2:05 PM 04/30/2022    2:26 PM 04/11/2022   11:20 AM 03/18/2022    8:37 AM  PHQ 2/9 Scores  PHQ - 2 Score 0 0 0 0 0 0 0  PHQ- 9 Score 0 0 0  0 0 0  Exception Documentation    Medical reason Medical reason Medical reason Medical reason    Fall Risk    01/13/2023    8:15 AM 01/09/2023    9:03 AM 12/02/2022    8:46 AM 10/01/2022    8:20 AM 09/01/2022    8:20 AM  Fall Risk   Falls in the past year? 0 0 0 0 0  Number falls in past yr: 0 0 0 0 0  Injury with Fall? 0 0 0 0 0  Risk  for fall due to :   No Fall Risks  No Fall Risks  Follow up Falls evaluation completed;Education provided;Falls prevention discussed  Falls evaluation completed Falls evaluation completed Falls evaluation completed    MEDICARE RISK AT HOME: Medicare Risk at Home Any stairs in or around the home?: Yes If so, are there any without handrails?: Yes Home free of loose throw rugs in walkways, pet beds, electrical cords, etc?: Yes Adequate lighting in your home to reduce risk of falls?: Yes Life alert?: Yes Use of a cane, walker or w/c?: Yes Grab bars in the bathroom?: Yes Shower chair or bench in shower?: Yes Elevated toilet seat or a handicapped toilet?: Yes  TIMED UP AND GO:  Was the test performed?  No    Cognitive Function:    07/17/2017    8:15 AM 06/22/2015   11:39 AM  MMSE - Mini Mental State Exam  Not completed: -- --        01/13/2023    8:17 AM 01/10/2022   11:29 AM 09/06/2020    8:17 AM 09/01/2019    8:11 AM  6CIT Screen  What Year? 0 points 0 points 0 points 0 points  What month? 0 points 0 points 0 points 0 points  What time? 0 points 0 points 0 points 0 points  Count back from 20 0 points 0 points 0 points 0 points  Months in reverse 0 points 0 points 0 points 0 points  Repeat phrase 0 points 2 points 0 points 0 points  Total Score 0 points 2 points 0 points 0 points    Immunizations Immunization History  Administered Date(s) Administered   Fluad Quad(high Dose 65+) 10/07/2018, 10/18/2019   Fluad Trivalent(High Dose 65+) 10/30/2022   Influenza Split 01/21/2011   Influenza Whole 11/19/2007   Influenza, High Dose Seasonal PF 10/16/2015, 11/12/2016, 11/07/2017, 10/20/2020   Influenza-Unspecified 12/18/2013, 11/05/2021   PFIZER Comirnaty(Gray Top)Covid-19 Tri-Sucrose Vaccine 05/31/2020   PFIZER(Purple Top)SARS-COV-2 Vaccination 03/17/2019, 04/11/2019, 11/03/2019   Pfizer Covid-19 Vaccine Bivalent Booster 21yrs & up 10/20/2020   Pfizer(Comirnaty)Fall Seasonal  Vaccine 12 years and older 10/30/2022   Pneumococcal Conjugate-13 08/26/2013   Pneumococcal Polysaccharide-23 02/04/2003, 06/08/2015   Td 02/03/2005   Zoster Recombinant(Shingrix)  02/18/2017, 07/19/2017    TDAP status: Due, Education has been provided regarding the importance of this vaccine. Advised may receive this vaccine at local pharmacy or Health Dept. Aware to provide a copy of the vaccination record if obtained from local pharmacy or Health Dept. Verbalized acceptance and understanding.  Flu Vaccine status: Up to date  Pneumococcal vaccine status: Up to date  Covid-19 vaccine status: Information provided on how to obtain vaccines.   Qualifies for Shingles Vaccine? No   Zostavax completed No   Shingrix Completed?: No.    Education has been provided regarding the importance of this vaccine. Patient has been advised to call insurance company to determine out of pocket expense if they have not yet received this vaccine. Advised may also receive vaccine at local pharmacy or Health Dept. Verbalized acceptance and understanding.  Screening Tests Health Maintenance  Topic Date Due   FOOT EXAM  12/12/2022   Colonoscopy  05/03/2023   DTaP/Tdap/Td (2 - Tdap) 01/13/2024 (Originally 02/04/2015)   COVID-19 Vaccine (7 - 2023-24 season) 03/01/2023   Diabetic kidney evaluation - Urine ACR  03/18/2023   Lung Cancer Screening  04/28/2023   MAMMOGRAM  04/28/2023   HEMOGLOBIN A1C  06/02/2023   OPHTHALMOLOGY EXAM  09/10/2023   DEXA SCAN  09/17/2023   Diabetic kidney evaluation - eGFR measurement  12/02/2023   Medicare Annual Wellness (AWV)  01/13/2024   Pneumonia Vaccine 64+ Years old  Completed   INFLUENZA VACCINE  Completed   Hepatitis C Screening  Completed   Zoster Vaccines- Shingrix  Completed   HPV VACCINES  Aged Out    Health Maintenance  Health Maintenance Due  Topic Date Due   FOOT EXAM  12/12/2022   Colonoscopy  05/03/2023    Colorectal cancer screening: Type of screening:  Colonoscopy. Completed 2022. Repeat every 3 years  Mammogram status: Completed  . Repeat every year  Bone Density status: Completed  . Results reflect: Bone density results: OSTEOPOROSIS. Repeat every 2 years.  Lung Cancer Screening: (Low Dose CT Chest recommended if Age 48-80 years, 20 pack-year currently smoking OR have quit w/in 15years.) does qualify.   Lung Cancer Screening Referral: ordered due in 04-2023  Additional Screening:  Hepatitis C Screening: does not qualify; Completed 2017  Vision Screening: Recommended annual ophthalmology exams for early detection of glaucoma and other disorders of the eye. Is the patient up to date with their annual eye exam?  Yes  Who is the provider or what is the name of the office in which the patient attends annual eye exams? [facility] If pt is not established with a provider, would they like to be referred to a provider to establish care? No .   Dental Screening: Recommended annual dental exams for proper oral hygiene  Nutrition Risk Assessment:  Has the patient had any N/V/D within the last 2 months?  No  Does the patient have any non-healing wounds?  No  Has the patient had any unintentional weight loss or weight gain?  No   Diabetes:  Is the patient diabetic?  Yes  If diabetic, was a CBG obtained today?  No  Did the patient bring in their glucometer from home?  No  How often do you monitor your CBG's? Continuous Glucose Monitor .   Financial Strains and Diabetes Management:  Are you having any financial strains with the device, your supplies or your medication? No .  Does the patient want to be seen by Chronic Care Management for management  of their diabetes?  No  Would the patient like to be referred to a Nutritionist or for Diabetic Management?  No   Diabetic Exams:  Diabetic Eye Exam: Completed  Pt has been advised about the importance in completing this exam  Diabetic Foot Exam: . Pt has been advised about the  importance in completing this exam. .    Community Resource Referral / Chronic Care Management: CRR required this visit?  No   CCM required this visit?  No     Plan:     I have personally reviewed and noted the following in the patient's chart:   Medical and social history Use of alcohol, tobacco or illicit drugs  Current medications and supplements including opioid prescriptions. Patient is not currently taking opioid prescriptions. Functional ability and status Nutritional status Physical activity Advanced directives List of other physicians Hospitalizations, surgeries, and ER visits in previous 12 months Vitals Screenings to include cognitive, depression, and falls Referrals and appointments  In addition, I have reviewed and discussed with patient certain preventive protocols, quality metrics, and best practice recommendations. A written personalized care plan for preventive services as well as general preventive health recommendations were provided to patient.     Remi Haggard, LPN   16/11/9602   After Visit Summary: (MyChart) Due to this being a telephonic visit, the after visit summary with patients personalized plan was offered to patient via MyChart   Nurse Notes:

## 2023-01-13 NOTE — Patient Instructions (Signed)
Tricia [name] , Thank you for taking time to come for your Medicare Wellness Visit. I appreciate your ongoing commitment to your health goals. Please review the following plan we discussed and let me know if I can assist you in the future.   Screening recommendations/referrals: Colonoscopy: Education provided Mammogram: up to date Bone Density: up to date Recommended yearly ophthalmology/optometry visit for glaucoma screening and checkup Recommended yearly dental visit for hygiene and checkup  Vaccinations: Influenza vaccine: up to date Pneumococcal vaccine: up to date Tdap vaccine: Education provider Shingles vaccine: up to date      Preventive Care 65 Years and Older, Female Preventive care refers to lifestyle choices and visits with your health care provider that can promote health and wellness. What does preventive care include? A yearly physical exam. This is also called an annual well check. Dental exams once or twice a year. Routine eye exams. Ask your health care provider how often you should have your eyes checked. Personal lifestyle choices, including: Daily care of your teeth and gums. Regular physical activity. Eating a healthy diet. Avoiding tobacco and drug use. Limiting alcohol use. Practicing safe sex. Taking low-dose aspirin every day. Taking vitamin and mineral supplements as recommended by your health care provider. What happens during an annual well check? The services and screenings done by your health care provider during your annual well check will depend on your age, overall health, lifestyle risk factors, and family history of disease. Counseling  Your health care provider may ask you questions about your: Alcohol use. Tobacco use. Drug use. Emotional well-being. Home and relationship well-being. Sexual activity. Eating habits. History of falls. Memory and ability to understand (cognition). Work and work Astronomer. Reproductive health. Screening   You may have the following tests or measurements: Height, weight, and BMI. Blood pressure. Lipid and cholesterol levels. These may be checked every 5 years, or more frequently if you are over 14 years old. Skin check. Lung cancer screening. You may have this screening every year starting at age 27 if you have a 30-pack-year history of smoking and currently smoke or have quit within the past 15 years. Fecal occult blood test (FOBT) of the stool. You may have this test every year starting at age 1. Flexible sigmoidoscopy or colonoscopy. You may have a sigmoidoscopy every 5 years or a colonoscopy every 10 years starting at age 68. Hepatitis C blood test. Hepatitis B blood test. Sexually transmitted disease (STD) testing. Diabetes screening. This is done by checking your blood sugar (glucose) after you have not eaten for a while (fasting). You may have this done every 1-3 years. Bone density scan. This is done to screen for osteoporosis. You may have this done starting at age 24. Mammogram. This may be done every 1-2 years. Talk to your health care provider about how often you should have regular mammograms. Talk with your health care provider about your test results, treatment options, and if necessary, the need for more tests. Vaccines  Your health care provider may recommend certain vaccines, such as: Influenza vaccine. This is recommended every year. Tetanus, diphtheria, and acellular pertussis (Tdap, Td) vaccine. You may need a Td booster every 10 years. Zoster vaccine. You may need this after age 72. Pneumococcal 13-valent conjugate (PCV13) vaccine. One dose is recommended after age 20. Pneumococcal polysaccharide (PPSV23) vaccine. One dose is recommended after age 23. Talk to your health care provider about which screenings and vaccines you need and how often you need them. This information is  not intended to replace advice given to you by your health care provider. Make sure you discuss  any questions you have with your health care provider. Document Released: 02/16/2015 Document Revised: 10/10/2015 Document Reviewed: 11/21/2014 Elsevier Interactive Patient Education  2017 [employer].  Fall Prevention in the Home Falls can cause injuries. They can happen to people of all ages. There are many things you can do to make your home safe and to help prevent falls. What can I do on the outside of my home? Regularly fix the edges of walkways and driveways and fix any cracks. Remove anything that might make you trip as you walk through a door, such as a raised step or threshold. Trim any bushes or trees on the path to your home. Use bright outdoor lighting. Clear any walking paths of anything that might make someone trip, such as rocks or tools. Regularly check to see if handrails are loose or broken. Make sure that both sides of any steps have handrails. Any raised decks and porches should have guardrails on the edges. Have any leaves, snow, or ice cleared regularly. Use sand or salt on walking paths during winter. Clean up any spills in your garage right away. This includes oil or grease spills. What can I do in the bathroom? Use night lights. Install grab bars by the toilet and in the tub and shower. Do not use towel bars as grab bars. Use non-skid mats or decals in the tub or shower. If you need to sit down in the shower, use a plastic, non-slip stool. Keep the floor dry. Clean up any water that spills on the floor as soon as it happens. Remove soap buildup in the tub or shower regularly. Attach bath mats securely with double-sided non-slip rug tape. Do not have throw rugs and other things on the floor that can make you trip. What can I do in the bedroom? Use night lights. Make sure that you have a light by your bed that is easy to reach. Do not use any sheets or blankets that are too big for your bed. They should not hang down onto the floor. Have a firm chair that has  side arms. You can use this for support while you get dressed. Do not have throw rugs and other things on the floor that can make you trip. What can I do in the kitchen? Clean up any spills right away. Avoid walking on wet floors. Keep items that you use a lot in easy-to-reach places. If you need to reach something above you, use a strong step stool that has a grab bar. Keep electrical cords out of the way. Do not use floor polish or wax that makes floors slippery. If you must use wax, use non-skid floor wax. Do not have throw rugs and other things on the floor that can make you trip. What can I do with my stairs? Do not leave any items on the stairs. Make sure that there are handrails on both sides of the stairs and use them. Fix handrails that are broken or loose. Make sure that handrails are as long as the stairways. Check any carpeting to make sure that it is firmly attached to the stairs. Fix any carpet that is loose or worn. Avoid having throw rugs at the top or bottom of the stairs. If you do have throw rugs, attach them to the floor with carpet tape. Make sure that you have a light switch at the top of the  stairs and the bottom of the stairs. If you do not have them, ask someone to add them for you. What else can I do to help prevent falls? Wear shoes that: Do not have high heels. Have rubber bottoms. Are comfortable and fit you well. Are closed at the toe. Do not wear sandals. If you use a stepladder: Make sure that it is fully opened. Do not climb a closed stepladder. Make sure that both sides of the stepladder are locked into place. Ask someone to hold it for you, if possible. Clearly mark and make sure that you can see: Any grab bars or handrails. First and last steps. Where the edge of each step is. Use tools that help you move around (mobility aids) if they are needed. These include: Canes. Walkers. Scooters. Crutches. Turn on the lights when you go into a dark area.  Replace any light bulbs as soon as they burn out. Set up your furniture so you have a clear path. Avoid moving your furniture around. If any of your floors are uneven, fix them. If there are any pets around you, be aware of where they are. Review your medicines with your doctor. Some medicines can make you feel dizzy. This can increase your chance of falling. Ask your doctor what other things that you can do to help prevent falls. This information is not intended to replace advice given to you by your health care provider. Make sure you discuss any questions you have with your health care provider. Document Released: 11/16/2008 Document Revised: 06/28/2015 Document Reviewed: 02/24/2014 Elsevier Interactive Patient Education  2017 [employer].

## 2023-01-20 ENCOUNTER — Other Ambulatory Visit (HOSPITAL_BASED_OUTPATIENT_CLINIC_OR_DEPARTMENT_OTHER): Payer: Self-pay | Admitting: Family Medicine

## 2023-01-20 ENCOUNTER — Telehealth (HOSPITAL_BASED_OUTPATIENT_CLINIC_OR_DEPARTMENT_OTHER): Payer: Self-pay | Admitting: *Deleted

## 2023-01-20 ENCOUNTER — Other Ambulatory Visit (HOSPITAL_BASED_OUTPATIENT_CLINIC_OR_DEPARTMENT_OTHER): Payer: Self-pay | Admitting: *Deleted

## 2023-01-20 ENCOUNTER — Telehealth (HOSPITAL_BASED_OUTPATIENT_CLINIC_OR_DEPARTMENT_OTHER): Payer: Self-pay | Admitting: Family Medicine

## 2023-01-20 DIAGNOSIS — Z1231 Encounter for screening mammogram for malignant neoplasm of breast: Secondary | ICD-10-CM

## 2023-01-20 DIAGNOSIS — Z780 Asymptomatic menopausal state: Secondary | ICD-10-CM

## 2023-01-20 NOTE — Telephone Encounter (Signed)
Pt also need's a order for a bone density test as well

## 2023-01-20 NOTE — Telephone Encounter (Signed)
Will referral team handle this prior authorization

## 2023-01-20 NOTE — Telephone Encounter (Signed)
Order placed for dexa

## 2023-01-20 NOTE — Telephone Encounter (Signed)
Copied from CRM [id number]. Topic: Referral - Status >> Jan 20, 2023  1:50 PM Almira Coaster wrote: Reason for CRM: Patient called to advise that she will be having the bone density test in March 2025 and was informed by her insurance that it will require a pre-auth.

## 2023-02-06 ENCOUNTER — Encounter (HOSPITAL_BASED_OUTPATIENT_CLINIC_OR_DEPARTMENT_OTHER): Payer: Self-pay | Admitting: Family Medicine

## 2023-02-12 ENCOUNTER — Other Ambulatory Visit (HOSPITAL_BASED_OUTPATIENT_CLINIC_OR_DEPARTMENT_OTHER): Payer: Self-pay | Admitting: *Deleted

## 2023-02-12 DIAGNOSIS — E119 Type 2 diabetes mellitus without complications: Secondary | ICD-10-CM

## 2023-02-12 MED ORDER — OZEMPIC (0.25 OR 0.5 MG/DOSE) 2 MG/3ML ~~LOC~~ SOPN: 0.5000 mg | PEN_INJECTOR | SUBCUTANEOUS | 1 refills | Status: DC

## 2023-03-04 ENCOUNTER — Encounter (HOSPITAL_BASED_OUTPATIENT_CLINIC_OR_DEPARTMENT_OTHER): Payer: Self-pay | Admitting: Family Medicine

## 2023-03-04 ENCOUNTER — Ambulatory Visit (HOSPITAL_BASED_OUTPATIENT_CLINIC_OR_DEPARTMENT_OTHER): Payer: PPO | Admitting: Family Medicine

## 2023-03-04 VITALS — BP 139/84 | HR 70 | Temp 97.8°F | Ht 62.0 in | Wt 218.5 lb

## 2023-03-04 DIAGNOSIS — N1831 Chronic kidney disease, stage 3a: Secondary | ICD-10-CM

## 2023-03-04 DIAGNOSIS — I152 Hypertension secondary to endocrine disorders: Secondary | ICD-10-CM | POA: Diagnosis not present

## 2023-03-04 DIAGNOSIS — E1122 Type 2 diabetes mellitus with diabetic chronic kidney disease: Secondary | ICD-10-CM | POA: Diagnosis not present

## 2023-03-04 DIAGNOSIS — M150 Primary generalized (osteo)arthritis: Secondary | ICD-10-CM

## 2023-03-04 DIAGNOSIS — Z7984 Long term (current) use of oral hypoglycemic drugs: Secondary | ICD-10-CM | POA: Diagnosis not present

## 2023-03-04 DIAGNOSIS — E1159 Type 2 diabetes mellitus with other circulatory complications: Secondary | ICD-10-CM

## 2023-03-04 LAB — POCT GLYCOSYLATED HEMOGLOBIN (HGB A1C)
HbA1c POC (<> result, manual entry): 5.9 % (ref 4.0–5.6)
HbA1c, POC (controlled diabetic range): 5.9 % (ref 0.0–7.0)
HbA1c, POC (prediabetic range): 5.9 % (ref 5.7–6.4)
Hemoglobin A1C: 5.9 % — AB (ref 4.0–5.6)

## 2023-03-04 MED ORDER — HYDROCODONE-ACETAMINOPHEN 10-325 MG PO TABS: 1.0000 | ORAL_TABLET | Freq: Every day | ORAL | 0 refills | Status: DC | PRN

## 2023-03-04 NOTE — Progress Notes (Signed)
    Procedures performed today:    None.  Independent interpretation of notes and tests performed by another provider:   None.  Brief History, Exam, Impression, and Recommendations:    BP 139/84 (BP Location: Left Arm, Patient Position: Sitting, Cuff Size: Large)   Pulse 70   Temp 97.8 F (36.6 C) (Oral)   Ht 5\' 2"  (1.575 m)   Wt 218 lb 8 oz (99.1 kg)   LMP  (LMP Unknown)   SpO2 97%   BMI 39.96 kg/m   Type 2 diabetes mellitus with stage 3a chronic kidney disease, without long-term current use of insulin (HCC) Assessment & Plan: Patient continues with Ozempic, has been administering 1 mg dose for 5 doses now.  Has been tolerating medication well.  Denies any significant GI symptoms.  She also continues with [place].  Prior hemoglobin A1c was at goal at 6.2%.  She is due for hemoglobin A1c checked today We discussed options, she will continue with Ozempic at 1 mg dose.  Can consider titration of Ozempic dose in the future as long as she continues to tolerate without issue We will proceed with hemoglobin A1c check today. Complete foot exam at future visit   Primary osteoarthritis involving multiple joints Assessment & Plan: Requesting refill of Norco today.  Reviewed PDMP, prior prescriptions for 30 tablets to be taken as needed.  Has received this medication intermittently over the past multiple years.  No red flags identified on PDMP review.  Discussed potential side effects related to chronic narcotic use.  Refill of medication provided today.  Orders: -     HYDROcodone-Acetaminophen; Take 1 tablet by mouth daily as needed.  Dispense: 30 tablet; Refill: 0  Hypertension associated with diabetes (HCC) Assessment & Plan: Borderline in office today, did improve slightly on recheck.  Continues with medications as prescribed.  No current issues with chest pain or headaches.  She does follow with cardiology and has upcoming appointment with them. Can continue with current  medications, no changes to be made today.  Recommend intermittent monitoring of blood pressure at home, DASH diet   Return in about 3 months (around 06/02/2023) for diabetes, hypertension.   ___________________________________________ Tricia Rabon de [place], MD, ABFM, CAQSM Primary Care and Sports Medicine [facility]

## 2023-03-04 NOTE — Assessment & Plan Note (Signed)
Patient continues with Ozempic, has been administering 1 mg dose for 5 doses now.  Has been tolerating medication well.  Denies any significant GI symptoms.  She also continues with [place].  Prior hemoglobin A1c was at goal at 6.2%.  She is due for hemoglobin A1c checked today We discussed options, she will continue with Ozempic at 1 mg dose.  Can consider titration of Ozempic dose in the future as long as she continues to tolerate without issue We will proceed with hemoglobin A1c check today. Complete foot exam at future visit

## 2023-03-04 NOTE — Assessment & Plan Note (Signed)
Borderline in office today, did improve slightly on recheck.  Continues with medications as prescribed.  No current issues with chest pain or headaches.  She does follow with cardiology and has upcoming appointment with them. Can continue with current medications, no changes to be made today.  Recommend intermittent monitoring of blood pressure at home, DASH diet

## 2023-03-04 NOTE — Patient Instructions (Signed)
?  Medication Instructions:  ?Your physician recommends that you continue on your current medications as directed. Please refer to the Current Medication list given to you today. ?--If you need a refill on any your medications before your next appointment, please call your pharmacy first. If no refills are authorized on file call the office.-- ? ? ? ?Follow-Up: ?Your next appointment:   ?Your physician recommends that you schedule a follow-up appointment in: 3 month follow-up with Dr. de [place] ? ?You will receive a text message or e-mail with a link to a survey about your care and experience with [place] today! We would greatly appreciate your feedback!  ? ?Thanks for letting us be apart of your health journey!!  ?Primary Care and Sports Medicine  ? ?Dr. Marcy Salvo de [place]  ? ?We encourage you to activate your patient portal called "MyChart".  Sign up information is provided on this After Visit Summary.  MyChart is used to connect with patients for Virtual Visits (Telemedicine).  Patients are able to view lab/test results, encounter notes, upcoming appointments, etc.  Non-urgent messages can be sent to your provider as well. To learn more about what you can do with MyChart, please visit --  [email or web address].    ?

## 2023-03-04 NOTE — Assessment & Plan Note (Signed)
Requesting refill of Norco today.  Reviewed PDMP, prior prescriptions for 30 tablets to be taken as needed.  Has received this medication intermittently over the past multiple years.  No red flags identified on PDMP review.  Discussed potential side effects related to chronic narcotic use.  Refill of medication provided today.

## 2023-03-11 ENCOUNTER — Other Ambulatory Visit (HOSPITAL_COMMUNITY): Payer: Self-pay

## 2023-03-11 ENCOUNTER — Other Ambulatory Visit (HOSPITAL_BASED_OUTPATIENT_CLINIC_OR_DEPARTMENT_OTHER): Payer: Self-pay

## 2023-03-11 ENCOUNTER — Other Ambulatory Visit: Payer: Self-pay

## 2023-03-12 ENCOUNTER — Other Ambulatory Visit (HOSPITAL_COMMUNITY): Payer: Self-pay

## 2023-03-12 ENCOUNTER — Encounter (HOSPITAL_BASED_OUTPATIENT_CLINIC_OR_DEPARTMENT_OTHER): Payer: Self-pay

## 2023-03-13 ENCOUNTER — Other Ambulatory Visit (HOSPITAL_COMMUNITY): Payer: Self-pay

## 2023-03-13 ENCOUNTER — Telehealth (HOSPITAL_BASED_OUTPATIENT_CLINIC_OR_DEPARTMENT_OTHER): Payer: Self-pay | Admitting: *Deleted

## 2023-03-13 MED ORDER — FREESTYLE LANCETS MISC: Freq: Four times a day (QID) | 5 refills | Status: AC

## 2023-03-13 MED ORDER — FREESTYLE LITE TEST VI STRP: ORAL_STRIP | 11 refills | Status: AC

## 2023-03-13 NOTE — Telephone Encounter (Signed)
 Called pt to let her know ozempic  has came into office.   She will come by next week to pick up

## 2023-03-16 ENCOUNTER — Other Ambulatory Visit (HOSPITAL_COMMUNITY): Payer: Self-pay

## 2023-03-16 ENCOUNTER — Other Ambulatory Visit: Payer: Self-pay

## 2023-03-16 ENCOUNTER — Other Ambulatory Visit: Payer: Self-pay | Admitting: Family Medicine

## 2023-03-16 ENCOUNTER — Other Ambulatory Visit (HOSPITAL_BASED_OUTPATIENT_CLINIC_OR_DEPARTMENT_OTHER): Payer: Self-pay | Admitting: Nurse Practitioner

## 2023-03-16 DIAGNOSIS — R0602 Shortness of breath: Secondary | ICD-10-CM

## 2023-03-16 DIAGNOSIS — I2510 Atherosclerotic heart disease of native coronary artery without angina pectoris: Secondary | ICD-10-CM

## 2023-03-16 DIAGNOSIS — N1831 Chronic kidney disease, stage 3a: Secondary | ICD-10-CM

## 2023-03-16 DIAGNOSIS — R600 Localized edema: Secondary | ICD-10-CM

## 2023-03-18 ENCOUNTER — Other Ambulatory Visit (HOSPITAL_COMMUNITY): Payer: Self-pay

## 2023-03-18 MED ORDER — POTASSIUM CHLORIDE CRYS ER 10 MEQ PO TBCR
10.0000 meq | EXTENDED_RELEASE_TABLET | ORAL | 0 refills | Status: AC | PRN
  Filled 2023-03-18: qty 30, 30d supply, fill #0
  Filled 2023-04-06: qty 90, 90d supply, fill #0

## 2023-03-18 MED ORDER — NITROGLYCERIN 0.4 MG/SPRAY TL SOLN
2 refills | Status: AC
  Filled 2023-03-18 – 2023-11-20 (×2): qty 4.9, 30d supply, fill #0

## 2023-03-18 MED ORDER — FUROSEMIDE 20 MG PO TABS
20.0000 mg | ORAL_TABLET | ORAL | 0 refills | Status: AC | PRN
  Filled 2023-03-18 – 2023-04-06 (×2): qty 30, 15d supply, fill #0

## 2023-03-19 ENCOUNTER — Other Ambulatory Visit: Payer: Self-pay

## 2023-03-19 ENCOUNTER — Other Ambulatory Visit (HOSPITAL_COMMUNITY): Payer: Self-pay

## 2023-03-20 ENCOUNTER — Other Ambulatory Visit (HOSPITAL_COMMUNITY): Payer: Self-pay

## 2023-03-20 ENCOUNTER — Other Ambulatory Visit: Payer: Self-pay

## 2023-03-23 ENCOUNTER — Other Ambulatory Visit (HOSPITAL_COMMUNITY): Payer: Self-pay

## 2023-03-23 ENCOUNTER — Other Ambulatory Visit: Payer: Self-pay

## 2023-03-23 NOTE — Progress Notes (Unsigned)
**Note Tricia-Identified via Obfuscation** [name] Visit   Referring Provider Tricia [name], Tricia Kos, MD [address] [place],  [place] [address]  Primary Care Provider Tricia [name], Tricia Kos, MD  Patient Profile: Tricia [name] is a 78 y.o. female with a past medical history of T2DM, HTN, CAD s/p NSTEMI 2017 with DES on Plavix and ASA, COPD, CKD3, osteoarthritis, partial nephrectomy, secondary hyperparathyroidism who returns to the [name] for follow-up of the problem(s) noted below.  Problem List: Personal history of multiple adenomatous colon polyps GERD with LPR History of gastric bypass   History of Present Illness   [name] was last seen in the GI office 04/25/2021 by Dr. Russella [name]   Current GI Meds  Pantoprazole 40 mg orally daily  Interval History  Ms. Tricia [name] presents to discuss surveillance colonoscopy in the setting of multiple medical comorbidities including CAD status post NSTEMI with drug-eluting stent currently on Plavix and aspirin.  Last colonoscopy in 2022 demonstrated 6 polyps - TAs  Last colonoscopy: 04/2020 -6 polyps (TAs), moderate diverticulosis in left colon, IH Last endoscopy: 04/2020 -gastric bypass, patchy, moderately erythematous mucosa in the gastric fundus and body  Last Abd CT/CTE/MRE: CTAP 10/2019 -no acute intra-abdominal or pelvic abnormality, atrophic right kidney, status post gastric bypass without obstruction  GI Review of Symptoms Significant for {GIROS:50592}. Otherwise negative.  General Review of Systems  Review of systems is significant for the pertinent positives and negatives as listed per the HPI.  Full ROS is otherwise negative.  Past Medical History   Past Medical History:  Diagnosis Date   Adenomatous polyp of colon 02/2004   Allergy 2000   Sulfur   Anemia    Anxiety    Arthritis    B12 deficiency    CAD (coronary artery disease)    a. s/p remote BMS to LAD;  b. 09/2015 Inf STEMI/VF Arrest: LM nl, LAD 85p (staged PCI 2  days later w/ 3.0x32 Synergy DES), 40p/m, 25d, LCX 72m, RCA 80ost/100p (2.75x32 Synergy DES), 55m, EF 55-65%. // c. Myoview 2/18: EF 59, no ischemia or infarction; Normal study // Myoview 3/22: EF 70, no ischemia or infarction; low risk   Chronic kidney disease, stage 3, mod decreased GFR (HCC) 03/09/2020   COPD (chronic obstructive pulmonary disease) (HCC) 2018   Chest X-ray   Dermatophytosis of groin and perianal area    Diet Controlled Diabetes Mellitus    Diverticulosis    GERD (gastroesophageal reflux disease)    H/O echocardiogram    a. 09/2015 Echo: EF 60-65%, no rwma, mild AI, mildly dil RA, mild to mod TR.   History of echocardiogram    Echo 3/22: EF 60-65, no RWMA, GR 1 DD, normal RVSF, mild LAE, RVSP 35.5, mild to moderate TR, trivial AI   Hyperlipidemia    Hypertension    Hypertensive heart disease    Low back pain    l5 disc   Lumbar spinal stenosis 03/03/2019   Morbid obesity (HCC)    Myocardial infarction (HCC) 2001, 2017   Neuromuscular disorder (HCC)    Osteoporosis    PVC's (premature ventricular contractions) 04/16/2017   Holter 3/19: NSR, average heart rate 69, frequent PVCs (5% total beats), rare supraventricular ectopics, no AF/flutter   Sleep apnea 10/03/2009   Resolved after gastric bypass     TOBACCO ABUSE 10/05/2009   Qualifier: Diagnosis of  By: Tricia Som, MD, [name]    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 In setting of inferior STEMI.  Past Surgical History   Past Surgical History:  Procedure Laterality Date   ABDOMINAL HYSTERECTOMY     APPENDECTOMY     BARIATRIC SURGERY     CARDIAC CATHETERIZATION N/A 10/01/2015   Procedure: Left Heart Cath and Coronary Angiography;  Surgeon: Tricia Lex, MD;  Location: [facility] INVASIVE CV LAB;  Service: Cardiovascular;  Laterality: N/A;   CARDIAC CATHETERIZATION N/A 10/01/2015   Procedure: Coronary Stent Intervention;  Surgeon: Tricia Lex, MD;  Location: [name] INVASIVE CV LAB;   Service: Cardiovascular;  Laterality: N/A;   CARDIAC CATHETERIZATION N/A 10/03/2015   Procedure: Coronary Stent Intervention;  Surgeon: Tricia Bihari, MD;  Location: [facility] INVASIVE CV LAB;  Service: Cardiovascular;  Laterality: N/A;   CARDIAC CATHETERIZATION N/A 10/03/2015   Procedure: Coronary/Graft Angiography;  Surgeon: Tricia Bihari, MD;  Location: [facility] INVASIVE CV LAB;  Service: Cardiovascular;  Laterality: N/A;   CARPAL TUNNEL RELEASE Bilateral    CERVICAL DISC SURGERY     CHOLECYSTECTOMY     CORONARY ANGIOPLASTY  2002   EYE SURGERY  2020 January   Cataracts   HERNIA REPAIR     JOINT REPLACEMENT  2003   Knee   LUMBAR LAMINECTOMY     L3-4   NEPHRECTOMY Right    Partial   SPINE SURGERY     C1   TONSILLECTOMY     TOTAL KNEE ARTHROPLASTY Left    TUBAL LIGATION     ULNAR NERVE REPAIR Left      Allergies and Medications   Allergies  Allergen Reactions   Sulfa Antibiotics Diarrhea and Nausea And Vomiting    @MEDSTODAY @  Family History   Family History  Problem Relation Age of Onset   Colitis Mother        sepsis from c dif colitis   Cancer Mother    COPD Mother    Diabetes Mother    Heart disease Mother    Hyperlipidemia Mother    Hypertension Mother    Heart disease Father    Early death Father    Lung cancer Maternal Uncle    Cancer Maternal Uncle    Stomach cancer Maternal Aunt    Lung cancer Maternal Uncle    Cancer Maternal Grandmother    Hearing loss Paternal Grandfather    Hyperlipidemia Paternal Grandfather    Hypertension Paternal Grandfather    Stroke Paternal Grandfather    Cancer Paternal Grandmother    Cancer Maternal Aunt    Cancer Maternal Uncle    Cancer Brother    Diabetes Sister    Early death Sister    Learning disabilities Sister    Learning disabilities Daughter    Vision loss Maternal Aunt    Colon cancer Neg Hx    Esophageal cancer Neg Hx    Rectal cancer Neg Hx    GI Specific Family History: {gifamhx:50061}   Social History    Social History   Tobacco Use   Smoking status: Former    Current packs/day: 0.00    Average packs/day: 2.0 packs/day for 41.0 years (82.0 ttl pk-yrs)    Types: Cigarettes    Start date: 08/04/1974    Quit date: 08/04/2015    Years since quitting: 7.6    Passive exposure: Past   Smokeless tobacco: Never   Tobacco comments:    Quit 2000 started again 2008, quit again 2011, started again 2014  Vaping Use   Vaping status: Never Used  Substance Use Topics   Alcohol use: No  Drug use: No   Jimmy reports that she quit smoking about 7 years ago. Her smoking use included cigarettes. She started smoking about 48 years ago. She has a 82 pack-year smoking history. She has been exposed to tobacco smoke. She has never used smokeless tobacco. She reports that she does not drink alcohol and does not use drugs.  Vital Signs and Physical Examination  There were no vitals filed for this visit. There is no height or weight on file to calculate BMI.    General: Well developed, well nourished, no acute distress Head: Normocephalic and atraumatic Eyes: Sclerae anicteric, EOMI Ears: Normal auditory acuity Mouth: No deformities or lesions noted Lungs: Clear throughout to auscultation Heart: Regular rate and rhythm; No murmurs, rubs or bruits Abdomen: Soft, non tender and non distended. No masses, hepatosplenomegaly or hernias noted. Normal Bowel sounds Rectal: Musculoskeletal: Symmetrical with no gross deformities  Pulses:  Normal pulses noted Extremities: No edema or deformities noted Neurological: Alert oriented x 4, grossly nonfocal Psychological:  Alert and cooperative. Normal mood and affect   Review of Data  The following data was reviewed at the time of this encounter:  Laboratory Studies      Latest Ref Rng & Units 03/18/2022    9:15 AM 12/11/2021   10:06 AM 06/10/2021    9:45 AM  CBC  WBC 3.4 - 10.8 x10E3/uL 8.4  8.2  10.5   Hemoglobin 11.1 - 15.9 g/dL 29.5  28.4  13.2    Hematocrit 34.0 - 46.6 % 42.4  40.6  42.2   Platelets 150 - 450 x10E3/uL 264  254  284     No results found for: "LIPASE"    Latest Ref Rng & Units 12/02/2022    9:21 AM 03/18/2022    9:15 AM 12/11/2021   10:06 AM  CMP  Glucose 70 - 99 mg/dL 440  102  725   BUN 8 - 27 mg/dL 16  25  21    Creatinine 0.57 - 1.00 mg/dL 3.66  4.40  3.47   Sodium 134 - 144 mmol/L 140  142  139   Potassium 3.5 - 5.2 mmol/L 4.6  4.3  5.3   Chloride 96 - 106 mmol/L 102  104  102   CO2 20 - 29 mmol/L 24  17  25    Calcium 8.7 - 10.3 mg/dL 9.7  9.9  42.5   Total Protein 6.0 - 8.5 g/dL 6.0  7.0  6.5   Total Bilirubin 0.0 - 1.2 mg/dL 0.4  0.4  0.3   Alkaline Phos 44 - 121 IU/L 191  185  187   AST 0 - 40 IU/L 18  17  18    ALT 0 - 32 IU/L 17  12  11       Imaging Studies  CTAP 10/2019 No acute intra-abdominal or pelvic abnormality, atrophic right kidney, status post gastric bypass without obstruction  GI Procedures and Studies  EGD/Colonoscopy 04/2020 EGD - gastric bypass, patchy, moderately erythematous mucosa in the gastric fundus and body Colonoscopy - 6 polyps (TAs), moderate diverticulosis in left colon, IH Path: Gastric oxyntic mucosa with hyperemia, negative H. pylori  Colonoscopy 04/2017 Four polyps, multiple diverticula, IH  EGD/colonoscopy 09/11/2012 EGD -prior gastric bypass surgery; otherwise normal Colonoscopy - 6 sessile polyps, mild diverticulosis, IH  Colonoscopy 08/2009 3 polyps, diverticula  Colonoscopy 02/2004 2 polyps, diverticula  Flexible sigmoidoscopy 02/2000 Normal  Clinical Impression  It is my clinical impression that Ms. Deller is a 78 y.o. female with;  Personal history of multiple adenomatous colon polyps GERD with LPR History of gastric bypass  Plan  *** *** *** *** ***   Planned Follow Up No follow-ups on file.  The patient or caregiver verbalized understanding of the material covered, with no barriers to understanding. All questions were answered. Patient  or caregiver is agreeable with the plan outlined above.    It was a pleasure to see Lessa.  If you have any questions or concerns regarding this evaluation, do not hesitate to contact me.  Maren Beach, MD [facility]

## 2023-03-24 ENCOUNTER — Telehealth: Payer: Self-pay | Admitting: *Deleted

## 2023-03-24 ENCOUNTER — Ambulatory Visit (INDEPENDENT_AMBULATORY_CARE_PROVIDER_SITE_OTHER): Payer: HMO | Admitting: Pediatrics

## 2023-03-24 ENCOUNTER — Other Ambulatory Visit: Payer: Self-pay

## 2023-03-24 ENCOUNTER — Telehealth: Payer: Self-pay

## 2023-03-24 ENCOUNTER — Encounter: Payer: Self-pay | Admitting: Pediatrics

## 2023-03-24 VITALS — BP 118/62 | HR 79 | Ht 61.0 in | Wt 218.0 lb

## 2023-03-24 DIAGNOSIS — Z860101 Personal history of adenomatous and serrated colon polyps: Secondary | ICD-10-CM

## 2023-03-24 DIAGNOSIS — K219 Gastro-esophageal reflux disease without esophagitis: Secondary | ICD-10-CM

## 2023-03-24 DIAGNOSIS — Z9884 Bariatric surgery status: Secondary | ICD-10-CM | POA: Diagnosis not present

## 2023-03-24 MED ORDER — SUFLAVE 178.7 G PO SOLR: 1.0000 | Freq: Once | ORAL | 0 refills | Status: AC

## 2023-03-24 NOTE — Telephone Encounter (Signed)
Pt called our office and scheduled in office 03/31/23 with Reather Littler, NP as she is past due for her f/u and needs preop clearance.   I will update the preop APP as we just got the request today.

## 2023-03-24 NOTE — Telephone Encounter (Signed)
Letter faxed to [facility]   Tricia CHARNLEY May 20, 1945 [id number]  03/24/2023   Dear Dr. Duke Salvia:  We have scheduled the above named patient for a(n) EGD/ Colonoscopy procedure. Our records show that (s)he is on anticoagulation therapy.  Please advise as to whether the patient may come off their therapy of Plavix 5 days prior to their procedure which is scheduled for 05/08/23 with Dr. Doy Hutching.  Please route your response to [facility], CMA or fax response to [phone].  Sincerely,    [facility]

## 2023-03-24 NOTE — Telephone Encounter (Signed)
Phone [phone] Faxed to [phone] and to clearance pool [phone]

## 2023-03-24 NOTE — Patient Instructions (Signed)
You have been scheduled for an endoscopy and colonoscopy. Please follow the written instructions given to you at your visit today.  If you use inhalers (even only as needed), please bring them with you on the day of your procedure.  DO NOT TAKE 7 DAYS PRIOR TO TEST- Trulicity (dulaglutide) Ozempic, Wegovy (semaglutide) Mounjaro (tirzepatide) Bydureon Bcise (exanatide extended release)  DO NOT TAKE 1 DAY PRIOR TO YOUR TEST Rybelsus (semaglutide) Adlyxin (lixisenatide) Victoza (liraglutide) Byetta (exanatide) _______________________________________________________________________   Bonita Quin will receive your bowel preparation through [facility], which ensures the lowest copay and home delivery, with outreach via text or call from an [phone] number. Please respond promptly to avoid rescheduling of your procedure. If you are interested in alternative options or have any questions regarding your prep, please contact them at [phone] ____________________________________________________________________________  Your Provider Has Sent Your Bowel Prep Regimen To [facility]   [facility] will contact you to verify your information and collect your copay, if applicable. Enjoy the comfort of your home while your prescription is mailed to you, FREE of any shipping charges.   [facility] accepts all major insurance benefits and applies discounts & coupons.  Have additional questions?   Chat: [email or web address] Call: [phone] Email: [email or web address] NCPDP: [id number]  How will [facility] contact you?  With a Welcome phone call,  a Welcome text and a checkout link in text form.  Texts you receive from [phone] Are NOT Spam.  *To set up delivery, you must complete the checkout process via link or speak to one of the patient care representatives. If [facility] is unable to reach you, your prescription may be delayed.  To avoid long hold times on the phone, you may also utilize  the secure chat feature on the [facility] website to request that they call you back for transaction completion or to expedite your concerns.  You will be contacted by our office prior to your procedure for directions on holding your Plavix.  If you do not hear from our office 1 week prior to your scheduled procedure, please call [phone] to discuss.   Please follow up in 4-6 months.  Thank you for entrusting me with your care and for choosing [facility], Dr. Maren Beach  _______________________________________________________  If your blood pressure at your visit was 140/90 or greater, please contact your primary care physician to follow up on this.  _______________________________________________________  If you are age 60 or older, your body mass index should be between 23-30. Your Body mass index is 41.19 kg/m. If this is out of the aforementioned range listed, please consider follow up with your Primary Care Provider.  If you are age 64 or younger, your body mass index should be between 19-25. Your Body mass index is 41.19 kg/m. If this is out of the aformentioned range listed, please consider follow up with your Primary Care Provider.   ________________________________________________________  The [facility] providers would like to encourage you to use [facility] to communicate with providers for non-urgent requests or questions.  Due to long hold times on the telephone, sending your provider a message by [facility] may be a faster and more efficient way to get a response.  Please allow 48 business hours for a response.  Please remember that this is for non-urgent requests.  _______________________________________________________

## 2023-03-24 NOTE — Telephone Encounter (Addendum)
   Pre-operative Risk Assessment    Patient Name: Tricia [name]  DOB: 02-11-1945 MRN: [id number]   Date of last office visit: 02/18/22 Gillian Shields, NP Date of next office visit: 03/31/23 Reather Littler, NP   Request for Surgical Clearance    Procedure:   EGD/COLONOSCOPY  Date of Surgery:  Clearance 05/08/23                                Surgeon:  DR. Doy Hutching Surgeon's Group or Practice Name:  [facility] Phone number:  [phone] Fax number:  [phone] ATTN: DEMITA, CMA   Type of Clearance Requested:   - Medical  - Pharmacy:  Hold Clopidogrel (Plavix) x 5 DAYS PRIOR TO PROCEDURE   Type of Anesthesia:  Not Indicated   Additional requests/questions:    Elpidio Anis   03/24/2023, 12:25 PM

## 2023-03-24 NOTE — Telephone Encounter (Signed)
   Name: Tricia [name]  DOB: 1945/06/13  MRN: [id number]  Primary Cardiologist: None  Chart reviewed as part of pre-operative protocol coverage. Because of Tricia [name]'s past medical history and time since last visit, she will require a follow-up in-office visit in order to better assess preoperative cardiovascular risk.  Patient has an office visit scheduled on 03/31/2023 with Reather Littler, NP. Appointment notes have been updated to reflect need for pre-op evaluation.   Pre-op covering staff:  - Please contact requesting surgeon's office via preferred method (i.e, phone, fax) to inform them of need for appointment prior to surgery.  Tricia Levering, NP  03/24/2023, 5:03 PM

## 2023-03-25 ENCOUNTER — Other Ambulatory Visit: Payer: Self-pay

## 2023-03-25 ENCOUNTER — Other Ambulatory Visit: Payer: Self-pay | Admitting: Pediatrics

## 2023-03-25 ENCOUNTER — Encounter: Payer: Self-pay | Admitting: Pediatrics

## 2023-03-25 DIAGNOSIS — D126 Benign neoplasm of colon, unspecified: Secondary | ICD-10-CM

## 2023-03-29 NOTE — Progress Notes (Addendum)
 Cardiology Office Note    Date:  03/31/2023  ID:  Zanyiah, Posten [birth year], MRN [id number] PCP:  de [place], Raymond J, MD  Cardiologist:  Tonny Bollman, MD  Electrophysiologist:  None   Chief Complaint: Preoperative cardiac evaluation   History of Present Illness: .    Tricia [name] is a 78 y.o. female with visit-pertinent history of coronary artery disease (s/p BMS to LAD, s/p inferior STEMI in 09/2015 with DES to RCA s/p staged PCI with DES to LAD due to ISR), hypertension, diabetes, hyperlipidemia, COPD, obesity, PVCs, venous insufficiency.  In 2017 patient presented with an inferior STEMI complicated by ventricular fibrillation arrest.  She underwent primary PCI of the right coronary artery followed by staged intervention of the LAD during her index hospitalization.  LV function recovered with an ejection fraction of 55 to 65%.  Patient had a follow-up nuclear scan in 2018 demonstrating preserved LV function and normal perfusion with no evidence of ischemia or infarction.  At office visit in 04/2020 patient reported increased shortness of breath and further workup was recommended.  Myoview on 04/16/2021 indicated no ischemia nor infarction.  Echo on 04/19/2021 indicated normal LVEF of 60 to 65%, no RWMA, G1 DD, RV mildly enlarged, normal wall RVS F/PASP, LA mildly dilated, mild to moderate TR and trivial AI.  Patient was last seen in follow-up on 02/18/2022.  She reported dyspnea on exertion, denies shortness of breath at rest.  It was recommended that patient undergo Lexiscan Myoview to rule out ischemia, however does not appear this was ever completed.  Today patient presents for follow-up and preoperative cardiac evaluation for colonoscopy.  Patient reports that she is doing well. She reports that she has not been having recent chest pain, she notes she did take one sublingual nitroglycerin three months ago for some chest discomfort, relieved with one dose.  She does endorse some shortness  of breath with exertion, she notes this has improved compared to last year but continues to persist.  She is unsure if this is related to allergies.  She notes that she is not able to be very active due to back pain, she is not able to walk long distances or climb a flight of stairs.    Labwork independently reviewed: 12/02/2022: Potassium 4.6, creatinine 0.95, AST 18, ALT 17 ROS: .   Today she denies lower extremity edema, fatigue, palpitations, melena, hematuria, hemoptysis, diaphoresis, weakness, presyncope, syncope, orthopnea, and PND.  All other systems are reviewed and otherwise negative. Studies Reviewed: Marland Kitchen    EKG:  EKG is ordered today, personally reviewed, demonstrating  EKG Interpretation Date/Time:  Tuesday March 31 2023 09:15:08 EST Ventricular Rate:  66 PR Interval:  134 QRS Duration:  84 QT Interval:  378 QTC Calculation: 396 R Axis:   -25  Text Interpretation: Normal sinus rhythm Possible Anterior infarct (cited on or before 03-Nov-2019) Nonspecific T wave abnormality No significant change since last tracing Confirmed by Reather Littler [phone]) on 03/31/2023 4:55:52 PM   CV Studies:  Cardiac Studies & Procedures   ______________________________________________________________________________________________ CARDIAC CATHETERIZATION  CARDIAC CATHETERIZATION 10/03/2015  Narrative  Prox RCA lesion, 0 %stenosed.  A drug eluting .  Ost RCA to Prox RCA lesion, 0 %stenosed.  A drug eluting .  Prox Cx to Mid Cx lesion, 40 %stenosed.  Prox LAD lesion, 85 %stenosed.  Dist LAD lesion, 25 %stenosed.  Mid RCA-2 lesion, 20 %stenosed.  Mid RCA-1 lesion, 65 %stenosed.  Dist Cx lesion, 60 %stenosed.  Ost 3rd  Mrg lesion, 40 %stenosed.  A STENT SYNERGY DES 3X32 drug eluting stent was successfully placed, and overlaps previously placed stent.  Prox LAD to Mid LAD lesion, 85 %stenosed.  Post intervention, there is a 0% residual stenosis.  Relook coronary angiography  confirmed widely patent stents in the ostium to proximal RCA.  There is no significant change in the 60% mid RCA stenosis with mild 20% narrowing proximal to the acute margin.  The left circumflex vessel has 40% proximal stenosis and there is distal bifurcation stenosis of 60 and 40% for which medical therapy is recommended.  The LAD has an 80 - 85% eccentric stenosis proximal to and including the very proximal portion of the previously placed proximal bare-metal LAD stent.  There is diffuse intimal hyperplasia of 40% within the stented segment.  The distal LAD stent is widely patent without significant intimal hyperplasia.  Successful percutaneous coronary intervention to the proximal LAD utilizing Angiosculpt scoring balloon 2.515 mm, and ultimate DES stenting with a Synergy 3.032 mm stent, which covers the proximal stenosis and the entire region of the previously placed proximal BMS stent with intimal hyperplasia.  The residual stenosis is 0%.  There is brisk TIMI-3 flow.  RECOMMENDATION: The patient will continue with dual antiplatelet therapy.  Medical therapy is recommended for concomitant RCA and left circumflex disease.  Findings Coronary Findings Diagnostic  Dominance: Right  Left Main Vessel is large.  Left Anterior Descending The lesion is discrete and eccentric. The lesion is smooth. The lesion was previously treated using a bare metal stent over 2 years ago. 2002 The lesion is irregular. The lesion was previously treated using a bare metal stent over 2 years ago.  First Diagonal Branch Vessel is small in size. The vessel exhibits minimal luminal irregularities.  First Septal Branch Vessel is small in size.  Second Diagonal Branch Vessel is moderate in size. Vessel is angiographically normal.  Second Septal Branch Vessel is small in size.  Third Diagonal Branch Vessel is moderate in size. Vessel is angiographically normal.  Third Septal Branch Vessel is small in  size.  Left Circumflex The lesion is tubular and smooth.  First Obtuse Marginal Branch Vessel is small in size.  Second Obtuse Marginal Branch Vessel is angiographically normal.  Lateral Second Obtuse Marginal Branch Vessel is small in size.  Third Obtuse Marginal Branch Vessel is moderate in size. Vessel is angiographically normal.  Lateral Third Obtuse Marginal Branch Vessel is small in size.  Right Coronary Artery Vessel is large. Ost RCA to Prox RCA lesion with no stenosis was previously treated. Prox RCA lesion with no stenosis was previously treated. Culprit lesion. The lesion is thrombotic. The lesion is located proximal to the major branch. Mid vessel  Acute Marginal Branch Vessel is small in size.  Right Posterior Descending Artery Vessel is moderate in size. Vessel is angiographically normal.  Inferior Septal Vessel is small in size.  Right Posterior Atrioventricular Artery Vessel is moderate in size. Vessel is angiographically normal.  First Right Posterolateral Branch Vessel is moderate in size. Vessel is angiographically normal.  Second Right Posterolateral Branch Vessel is moderate in size. Vessel is angiographically normal.  Intervention  Prox LAD lesion Angioplasty (Also treats lesions: Prox LAD to Mid LAD) Lesion crossed with guidewire. Pre-stent angioplasty was performed using a BALLOON ANGIOSCULPT RX 2.5X15. Maximum pressure: 12 atm. A STENT SYNERGY DES 3X32 drug eluting stent was successfully placed, and overlaps previously placed stent. Stent strut is well apposed. Post-stent angioplasty was performed using a  BALLOON Advance EMERGE MR 3.25X20. The pre-interventional distal flow is normal (TIMI 3).  The post-interventional distal flow is normal (TIMI 3). The intervention was successful . No complications occurred at this lesion. Pressure wire/FFR was not performed on the lesion . IVUS was not performed on the lesion . There is a 0% residual stenosis  post intervention.  Prox LAD to Mid LAD lesion Angioplasty (Also treats lesions: Prox LAD) See details in Prox LAD lesion. There is a 0% residual stenosis post intervention.   CARDIAC CATHETERIZATION 10/01/2015  Narrative Images from the original result were not included.   Ost RCA to Prox RCA lesion, 80 %stenosed followed by Prox RCA lesion, 100 % thrombotic stenosis.  PTCA followed by PCI with a STENT SYNERGY DES 2.75X32 drug eluting stent was successfully placed. Post intervention, there is a 0% residual stenosis.  Mid RCA lesion, 65 %stenosed - uncertain significance.  Prox Cx to Mid Cx lesion, 40 %stenosed.  Prox LAD to Mid LAD bare metal stent, 40 %stenosed. Prox LAD lesion, 85 % in-stent restenosis  Dist LAD bare-metal stent, 25 %stenosed.  LV function is normal. The left ventricular ejection fraction is 55-65% by visual estimate. No obvious wall motion abnormality  LV end diastolic pressure is low to normal. - 750 mL normal saline administered.  Tortuous innominate artery. Would recommend femoral access for catheterizations in the future.   Successful culprit lesion PCI of the proximal and ostial RCA lesions with a single DES stent covering both segments. 60-65% lesion in the mid RCA. Unable to evaluate with IC nitroglycerin due to hypotension.  Residual severe  mid LAD in-stent restenosis, would recommend PCI prior to discharge.  Plan:  Transfer to CCU for ongoing care. TR band removal per protocol. Sheath removal 2 hours post Angiomax  Would hold ARB and beta blocker for now given hypotension in the Cath Lab. May restart tomorrow.  Aspirin plus Brilinta for minimum of 3 months. Would then potentially consider stopping aspirin.  Continue statin and nitrate  Would plan to return for staged PCI of the LAD lesion that is at least 85% as well as relook angiography of the right coronary artery to assess if this lesion is be treated as well.  Would not return until at  least Wednesday, August 30.   Bryan Lemma, M.D., M.S. Interventional Cardiologist  Pager # [phone] Phone # [phone] [address] [place], [place] [address]  Findings Coronary Findings Diagnostic  Dominance: Right  Left Main Vessel is large.  Left Anterior Descending The lesion is discrete and eccentric. The lesion is smooth. The lesion was previously treated using a bare metal stent over 2 years ago. 2002 The lesion is irregular. The lesion was previously treated using a bare metal stent over 2 years ago.  First Diagonal Branch Vessel is small in size. The vessel exhibits minimal luminal irregularities.  First Septal Branch Vessel is small in size.  Second Diagonal Branch Vessel is moderate in size. Vessel is angiographically normal.  Second Septal Branch Vessel is small in size.  Third Diagonal Branch Vessel is moderate in size. Vessel is angiographically normal.  Third Septal Branch Vessel is small in size.  Left Circumflex The lesion is tubular and smooth.  First Obtuse Marginal Branch Vessel is small in size.  Second Obtuse Marginal Branch Vessel is angiographically normal.  Lateral Second Obtuse Marginal Branch Vessel is small in size.  Third Obtuse Marginal Branch Vessel is moderate in size. Vessel is angiographically normal.  Lateral Third  Obtuse Marginal Branch Vessel is small in size.  Right Coronary Artery Vessel is large.  Culprit lesion. The lesion is thrombotic. The lesion is located proximal to the major branch. Mid vessel  Acute Marginal Branch Vessel is small in size.  Right Posterior Descending Artery Vessel is moderate in size. Vessel is angiographically normal.  Inferior Septal Vessel is small in size.  Right Posterior Atrioventricular Artery Vessel is moderate in size. Vessel is angiographically normal.  First Right Posterolateral Branch Vessel is moderate in size. Vessel is angiographically  normal.  Second Right Posterolateral Branch Vessel is moderate in size. Vessel is angiographically normal.  Intervention  Ost RCA to Prox RCA lesion Angioplasty (Also treats lesions: Prox RCA) Lesion length: 30 mm. Lesion crossed with guidewire using a WIRE ASAHI PROWATER 180CM. Pre-stent angioplasty was performed using a BALLOON [place] EMERGE MR 2.0X15. Maximum pressure: 10 atm. Inflation time: 20 sec. 3 separate inflations A STENT SYNERGY DES 2.75X32 drug eluting stent was successfully placed. Minimum lumen area: 3 mm. Stent strut is well apposed. Post-stent angioplasty was performed using a BALLOON  EUPHORA RX3.0X20. Maximum pressure: 16 atm. Inflation time: 25 sec. There is no pre-interventional antegrade distal flow (TIMI 0).  The post-interventional distal flow is normal (TIMI 3). The intervention was successful . No complications occurred at this lesion. GUIDE CATH RUNWAY 6FR FR4 SH ([id number]) There is a 0% residual stenosis post intervention.  Prox RCA lesion Angioplasty (Also treats lesions: Ost RCA to Prox RCA) See details in Ost RCA to Prox RCA lesion. There is a 0% residual stenosis post intervention.   STRESS TESTS  MYOCARDIAL PERFUSION IMAGING 04/16/2020  Narrative  The left ventricular ejection fraction is hyperdynamic (>65%).  Nuclear stress EF: 70%. There are no wall motion abnormalities  There was no ST segment deviation noted during stress.  The study is normal.  This is a low risk study. There is no evidence of ischemia or infarction.  Donato Schultz, MD   ECHOCARDIOGRAM  ECHOCARDIOGRAM COMPLETE 04/19/2020  Narrative ECHOCARDIOGRAM REPORT    Patient Name:   Tricia [name] Date of Exam: 04/19/2020 Medical Rec #:  [id number]      Height:       62.0 in Accession #:    [id number]     Weight:       236.0 lb Date of Birth:  [birth year]      BSA:          2.051 m Patient Age:    74 years       BP:           139/74 mmHg Patient Gender: F              HR:            57 bpm. Exam Location:  [address]  Procedure: 2D Echo, Cardiac Doppler, Color Doppler and Intracardiac Opacification Agent  Indications:    R06.00 Dyspnea  History:        Patient has prior history of Echocardiogram examinations, most recent 10/02/2015. CAD and STEMI, CKD and COPD, Arrythmias:PVC and Cardiac Arrest; Risk Factors:Hypertension and HLD, Edema.  Sonographer:    Clearence Ped RCS Referring Phys: [id number] SCOTT T WEAVER  IMPRESSIONS   1. Left ventricular ejection fraction, by estimation, is 60 to 65%. The left ventricle has normal function. The left ventricle has no regional wall motion abnormalities. Left ventricular diastolic parameters are consistent with Grade I diastolic dysfunction (impaired relaxation). Elevated left atrial pressure. 2. Right ventricular systolic  function is normal. The right ventricular size is mildly enlarged. There is normal pulmonary artery systolic pressure. 3. Left atrial size was mildly dilated. 4. The mitral valve is normal in structure. No evidence of mitral valve regurgitation. 5. Tricuspid valve regurgitation is mild to moderate. 6. The aortic valve is tricuspid. Aortic valve regurgitation is trivial.  FINDINGS Left Ventricle: Left ventricular ejection fraction, by estimation, is 60 to 65%. The left ventricle has normal function. The left ventricle has no regional wall motion abnormalities. The left ventricular internal cavity size was normal in size. There is no left ventricular hypertrophy. Left ventricular diastolic parameters are consistent with Grade I diastolic dysfunction (impaired relaxation). Elevated left atrial pressure.  Right Ventricle: The right ventricular size is mildly enlarged. No increase in right ventricular wall thickness. Right ventricular systolic function is normal. There is normal pulmonary artery systolic pressure. The tricuspid regurgitant velocity is 2.85 m/s, and with an assumed right atrial pressure of 3  mmHg, the estimated right ventricular systolic pressure is 35.5 mmHg.  Left Atrium: Left atrial size was mildly dilated.  Right Atrium: Right atrial size was normal in size.  Pericardium: There is no evidence of pericardial effusion.  Mitral Valve: The mitral valve is normal in structure. No evidence of mitral valve regurgitation.  Tricuspid Valve: The tricuspid valve is normal in structure. Tricuspid valve regurgitation is mild to moderate.  Aortic Valve: The aortic valve is tricuspid. Aortic valve regurgitation is trivial. Aortic regurgitation PHT measures 940 msec.  Pulmonic Valve: The pulmonic valve was grossly normal. Pulmonic valve regurgitation is mild.  Aorta: The aortic root and ascending aorta are structurally normal, with no evidence of dilitation.  IAS/Shunts: No atrial level shunt detected by color flow Doppler.   LEFT VENTRICLE PLAX 2D LVIDd:         3.60 cm  Diastology LVIDs:         2.30 cm  LV e' medial:    6.00 cm/s LV PW:         1.00 cm  LV E/e' medial:  14.1 LV IVS:        1.10 cm  LV e' lateral:   12.90 cm/s LVOT diam:     1.90 cm  LV E/e' lateral: 6.6 LV SV:         77 LV SV Index:   38       2D Longitudinal Strain LVOT Area:     2.84 cm 2D Strain GLS (A2C):   -22.2 % 2D Strain GLS (A3C):   -22.5 % 2D Strain GLS (A4C):   -23.7 % 2D Strain GLS Avg:     -22.8 %  RIGHT VENTRICLE RV Basal diam:  3.10 cm RV S prime:     14.10 cm/s TAPSE (M-mode): 1.8 cm RVSP:           35.5 mmHg  LEFT ATRIUM             Index       RIGHT ATRIUM           Index LA diam:        3.90 cm 1.90 cm/m  RA Pressure: 3.00 mmHg LA Vol (A2C):   56.5 ml 27.55 ml/m RA Area:     15.10 cm LA Vol (A4C):   60.9 ml 29.69 ml/m RA Volume:   40.90 ml  19.94 ml/m LA Biplane Vol: 60.2 ml 29.35 ml/m AORTIC VALVE LVOT Vmax:   106.00 cm/s LVOT Vmean:  75.000 cm/s LVOT VTI:  0.273 m AI PHT:      940 msec  AORTA Ao Root diam: 3.20 cm Ao Asc diam:  3.40 cm  MITRAL VALVE                TRICUSPID VALVE MV Area (PHT): 3.61 cm    TR Peak grad:   32.5 mmHg MV Decel Time: 210 msec    TR Vmax:        285.00 cm/s MV E velocity: 84.80 cm/s  Estimated RAP:  3.00 mmHg MV A velocity: 99.40 cm/s  RVSP:           35.5 mmHg MV E/A ratio:  0.85 SHUNTS Systemic VTI:  0.27 m Systemic Diam: 1.90 cm  Rachelle Hora Croitoru MD Electronically signed by Thurmon Fair MD Signature Date/Time: 04/19/2020/4:34:17 PM    Final          ______________________________________________________________________________________________       Current Reported Medications:.    Current Meds  Medication Sig   albuterol (PROAIR HFA) 108 (90 Base) MCG/ACT inhaler Inhale 2 puffs into the lungs every 6 (six) hours as needed for wheezing or shortness of breath.   amLODipine (NORVASC) 10 MG tablet Take 1 tablet (10 mg total) by mouth daily.   aspirin EC 81 MG tablet Take 81 mg by mouth daily.   Blood Glucose Monitoring Suppl DEVI 1 each by Does not apply route in the morning, at noon, and at bedtime. May substitute to any manufacturer covered by patient's insurance. Patient states they use One Touch Verio Reflect.   carvedilol (COREG) 6.25 MG tablet Take 1 tablet (6.25 mg total) by mouth 2 (two) times daily with a meal.   Cholecalciferol (VITAMIN D) 50 MCG (2000 UT) tablet Take 1 tablet (2,000 Units total) by mouth daily.   clopidogrel (PLAVIX) 75 MG tablet Take 1 tablet (75 mg total) by mouth daily.   Continuous Glucose Sensor (FREESTYLE LIBRE 3 SENSOR) MISC 1 Units by Does not apply route every 14 (fourteen) days.   dapagliflozin propanediol (FARXIGA) 10 MG TABS tablet Take 1 tablet (10 mg total) by mouth daily.   ezetimibe-simvastatin (VYTORIN) 10-80 MG tablet Take 1 tablet by mouth at bedtime.   furosemide (LASIX) 20 MG tablet Take 1-2 tablets (20-40 mg total) by mouth daily as needed for fluid or edema.   glucose blood (FREESTYLE LITE) test strip Use to test blood sugar up to four times daily    HYDROcodone-acetaminophen (NORCO) 10-325 MG tablet Take 1 tablet by mouth daily as needed.   Lancets (FREESTYLE) lancets Use to test up to 4 (four) times daily.   Multiple Vitamin (MULTIVITAMIN) tablet Take 1 tablet by mouth daily.   nitroGLYCERIN (NITROLINGUAL) 0.4 MG/SPRAY spray Place 1 spray under the tongue every five minutes for 3 doses as needed for chest pain.   ONETOUCH VERIO test strip USE AS DIRECTED IN THE MORNING, AT NOON, AND AT BEDTIME   pantoprazole (PROTONIX) 40 MG tablet Take 1 tablet (40 mg total) by mouth 2 (two) times daily before a meal.   potassium chloride (KLOR-CON M) 10 MEQ tablet TAKE ONE TABLET BY MOUTH WHEN YOU TAKE YOUR FUROSEMIDE FOR SWELLING.   Semaglutide,0.25 or 0.5MG /DOS, (OZEMPIC, 0.25 OR 0.5 MG/DOSE,) 2 MG/3ML SOPN Inject 0.5 mg into the skin once a week.   valsartan (DIOVAN) 320 MG tablet Take 1 tablet (320 mg total) by mouth daily.   Physical Exam:    VS:  BP 126/70 (BP Location: Right Arm, Patient Position: Sitting, Cuff Size: Large)  Pulse 66   Ht 5\' 1"  (1.549 m)   Wt 220 lb (99.8 kg)   LMP  (LMP Unknown)   BMI 41.57 kg/m    Wt Readings from Last 3 Encounters:  03/31/23 220 lb (99.8 kg)  03/24/23 218 lb (98.9 kg)  03/04/23 218 lb 8 oz (99.1 kg)    GEN: Well nourished, well developed in no acute distress NECK: No JVD; No carotid bruits CARDIAC: RRR, no murmurs, rubs, gallops RESPIRATORY:  Clear to auscultation without rales, wheezing or rhonchi  ABDOMEN: Soft, non-tender, non-distended EXTREMITIES:  No edema; No acute deformity   Asessement and Plan:.    Preoperative cardiac evaluation: Patient notes improved but ongoing dyspnea with exertion, notes 1 episode of chest discomfort 3 months ago requiring 1 sublingual nitroglycerin that resolved.  She is unable to meet 4 METS of activity.  Patient to undergo echocardiogram and nuclear stress test as noted below.  Patient will have follow-up after testing, preoperative cardiac evaluation will be  addressed at that time.  ADDENDUM 05/03/23: Patient underwent nuclear stress test on 04/07/2023 that was normal and low risk.  Echocardiogram on 04/29/2023 was reassuring.  Patient can proceed with EGD/colonoscopy at acceptable cardiac risk. Clopidogrel (Plavix) can be held for 5 days prior to her colonoscopy and resumed as soon as possible post op, when felt to be safe.  Ideally patient should remain on aspirin 81 mg daily without interruption.  CAD/shortness of breath: s/p BMS to LAD, s/p inferior STEMI in 09/2015 with DES to RCA s/p staged PCI with DES to LAD due to ISR.  Today patient reports improved ongoing shortness of breath, she notes 1 episode of chest discomfort 3 months ago relieved after 1 sublingual nitroglycerin.  She denies any recurrence.  As patient needs to undergo colonoscopy and requires preoperative clearance and is unable to meet 4 METS of activity she is agreeable to undergo echocardiogram for evaluation of shortness of breath and nuclear stress test, please see consent below.  Patient is unable to walk long distances, she is not a treadmill candidate.  Reviewed ED precautions.  Continue amlodipine 10 mg daily, aspirin 81 mg daily, carvedilol 6.25 mg twice daily, Plavix 75 mg daily, Farxiga 10 mg daily, Lasix 20 mg daily as needed and valsartan 320 mg daily.  Check CBC and CMET. Informed Consent   Shared Decision Making/Informed Consent The risks [chest pain, shortness of breath, cardiac arrhythmias, dizziness, blood pressure fluctuations, myocardial infarction, stroke/transient ischemic attack, nausea, vomiting, allergic reaction, radiation exposure, metallic taste sensation and life-threatening complications (estimated to be 1 in 10,000)], benefits (risk stratification, diagnosing coronary artery disease, treatment guidance) and alternatives of a nuclear stress test were discussed in detail with Ms. Arrowood and she agrees to proceed.     HTN: Blood pressure today 126/70.  Continue current  antihypertensive regimen.  HLD: Last profile in 03/2022 indicated total cholesterol 125, HDL 48, triglycerides 150 and LDL 51.  Check fasting lipid profile and CMET.  Continue Vytorin 10-80 mg daily pending labs.  DM2: Last hemoglobin A1c 5.9 on 03/04/2023.  Monitored and managed per her PCP.  Obesity: S/p bariatric surgery.  Weight loss via diet and exercise encouraged.  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  Continue carvedilol 6.25 mg twice daily.  CKD stage III: Last creatinine 0.95 on 12/02/2022. Follows with nephrology.   Disposition: F/u with Reather Littler, NP following testing.   Signed, Rip Harbour, NP

## 2023-03-31 ENCOUNTER — Encounter: Payer: Self-pay | Admitting: Cardiology

## 2023-03-31 ENCOUNTER — Ambulatory Visit: Payer: HMO | Attending: Cardiology | Admitting: Cardiology

## 2023-03-31 VITALS — BP 126/70 | HR 66 | Ht 61.0 in | Wt 220.0 lb

## 2023-03-31 DIAGNOSIS — R0602 Shortness of breath: Secondary | ICD-10-CM | POA: Diagnosis not present

## 2023-03-31 DIAGNOSIS — I152 Hypertension secondary to endocrine disorders: Secondary | ICD-10-CM | POA: Diagnosis not present

## 2023-03-31 DIAGNOSIS — I493 Ventricular premature depolarization: Secondary | ICD-10-CM

## 2023-03-31 DIAGNOSIS — I25118 Atherosclerotic heart disease of native coronary artery with other forms of angina pectoris: Secondary | ICD-10-CM | POA: Diagnosis not present

## 2023-03-31 DIAGNOSIS — E1159 Type 2 diabetes mellitus with other circulatory complications: Secondary | ICD-10-CM | POA: Diagnosis not present

## 2023-03-31 DIAGNOSIS — E785 Hyperlipidemia, unspecified: Secondary | ICD-10-CM

## 2023-03-31 DIAGNOSIS — Z01810 Encounter for preprocedural cardiovascular examination: Secondary | ICD-10-CM | POA: Diagnosis not present

## 2023-03-31 LAB — CBC

## 2023-03-31 NOTE — Patient Instructions (Signed)
 Medication Instructions:  No changes *If you need a refill on your cardiac medications before your next appointment, please call your pharmacy*  Lab Work: Today we are going to draw Cmet, CBC, and Lipid panel If you have labs (blood work) drawn today and your tests are completely normal, you will receive your results only by: MyChart Message (if you have MyChart) OR A paper copy in the mail If you have any lab test that is abnormal or we need to change your treatment, we will call you to review the results.  Testing/Procedures: Your physician has requested that you have an echocardiogram. Echocardiography is a painless test that uses sound waves to create images of your heart. It provides your doctor with information about the size and shape of your heart and how well your heart's chambers and valves are working. This procedure takes approximately one hour. There are no restrictions for this procedure. Please do NOT wear cologne, perfume, aftershave, or lotions (deodorant is allowed). Please arrive 15 minutes prior to your appointment time.  Please note: We ask at that you not bring children with you during ultrasound (echo/ vascular) testing. Due to room size and safety concerns, children are not allowed in the ultrasound rooms during exams. Our front office staff cannot provide observation of children in our lobby area while testing is being conducted. An adult accompanying a patient to their appointment will only be allowed in the ultrasound room at the discretion of the ultrasound technician under special circumstances. We apologize for any inconvenience.  Your physician has requested that you have a lexiscan myoview. For further information please visit [email or web address] Please follow instruction sheet, as given.  Follow-Up: At [facility], you and your health needs are our priority.  As part of our continuing mission to provide you with exceptional heart care, we have created  designated Provider Care Teams.  These Care Teams include your primary Cardiologist (physician) and Advanced Practice Providers (APPs -  Physician Assistants and Nurse Practitioners) who all work together to provide you with the care you need, when you need it.  We recommend signing up for the patient portal called "MyChart".  Sign up information is provided on this After Visit Summary.  MyChart is used to connect with patients for Virtual Visits (Telemedicine).  Patients are able to view lab/test results, encounter notes, upcoming appointments, etc.  Non-urgent messages can be sent to your provider as well.   To learn more about what you can do with MyChart, go to [email or web address].    Your next appointment:   After testing  Provider:   Reather Littler, NP

## 2023-04-01 LAB — COMPREHENSIVE METABOLIC PANEL
ALT: 18 [IU]/L (ref 0–32)
AST: 19 [IU]/L (ref 0–40)
Albumin: 4.3 g/dL (ref 3.8–4.8)
Alkaline Phosphatase: 182 [IU]/L — ABNORMAL HIGH (ref 44–121)
BUN/Creatinine Ratio: 13 (ref 12–28)
Bilirubin Total: 0.4 mg/dL (ref 0.0–1.2)
CO2: 23 mmol/L (ref 20–29)
Calcium: 10.3 mg/dL (ref 8.7–10.3)
Chloride: 106 mmol/L (ref 96–106)
Creatinine, Ser: 1.03 mg/dL — ABNORMAL HIGH (ref 0.57–1.00)
Globulin, Total: 13 mg/dL (ref 8–4.5)
Globulin, Total: 2.2 g/dL (ref 1.5–4.5)
Glucose: 120 mg/dL — ABNORMAL HIGH (ref 70–99)
Potassium: 4.6 mmol/L (ref 3.5–5.2)
Sodium: 143 mmol/L (ref 134–144)
Total Protein: 6.5 g/dL (ref 6.0–8.5)
eGFR: 57 mL/min/{1.73_m2} — ABNORMAL LOW (ref >59)

## 2023-04-01 LAB — LIPID PANEL
Chol/HDL Ratio: 2.2 ratio (ref 0.0–4.4)
Cholesterol, Total: 137 mg/dL (ref 100–199)
HDL: 63 mg/dL
LDL Chol Calc (NIH): 52 mg/dL (ref 0–99)
Triglycerides: 129 mg/dL (ref 0–149)
VLDL Cholesterol Cal: 22 mg/dL (ref 5–40)

## 2023-04-01 LAB — CBC
Hematocrit: 46.6 % (ref 34.0–46.6)
Hemoglobin: 14.8 g/dL (ref 11.1–15.9)
MCH: 26.8 pg (ref 26.6–33.0)
MCHC: 31.8 g/dL (ref 31.5–35.7)
MCV: 84 fL (ref 79–97)
Platelets: 258 10*3/uL (ref 150–450)
RBC: 5.52 x10E6/uL — ABNORMAL HIGH (ref 3.77–5.28)
RDW: 14.3 % (ref 11.7–15.4)
WBC: 8.9 10*3/uL (ref 3.4–10.8)

## 2023-04-02 DIAGNOSIS — I2510 Atherosclerotic heart disease of native coronary artery without angina pectoris: Secondary | ICD-10-CM | POA: Diagnosis not present

## 2023-04-02 DIAGNOSIS — E1121 Type 2 diabetes mellitus with diabetic nephropathy: Secondary | ICD-10-CM | POA: Diagnosis not present

## 2023-04-02 DIAGNOSIS — N1831 Chronic kidney disease, stage 3a: Secondary | ICD-10-CM | POA: Diagnosis not present

## 2023-04-02 DIAGNOSIS — N2581 Secondary hyperparathyroidism of renal origin: Secondary | ICD-10-CM | POA: Diagnosis not present

## 2023-04-02 DIAGNOSIS — I129 Hypertensive chronic kidney disease with stage 1 through stage 4 chronic kidney disease, or unspecified chronic kidney disease: Secondary | ICD-10-CM | POA: Diagnosis not present

## 2023-04-02 DIAGNOSIS — E1122 Type 2 diabetes mellitus with diabetic chronic kidney disease: Secondary | ICD-10-CM | POA: Diagnosis not present

## 2023-04-03 ENCOUNTER — Inpatient Hospital Stay: Payer: HMO | Attending: Internal Medicine | Admitting: Genetic Counselor

## 2023-04-03 ENCOUNTER — Inpatient Hospital Stay: Payer: HMO

## 2023-04-03 ENCOUNTER — Encounter: Payer: Self-pay | Admitting: Genetic Counselor

## 2023-04-03 DIAGNOSIS — Z803 Family history of malignant neoplasm of breast: Secondary | ICD-10-CM

## 2023-04-03 DIAGNOSIS — Z860100 Personal history of colon polyps, unspecified: Secondary | ICD-10-CM | POA: Diagnosis not present

## 2023-04-03 DIAGNOSIS — Z800 Family history of malignant neoplasm of digestive organs: Secondary | ICD-10-CM

## 2023-04-03 DIAGNOSIS — Z1379 Encounter for other screening for genetic and chromosomal anomalies: Secondary | ICD-10-CM | POA: Diagnosis not present

## 2023-04-03 DIAGNOSIS — Z8042 Family history of malignant neoplasm of prostate: Secondary | ICD-10-CM | POA: Diagnosis not present

## 2023-04-03 LAB — GENETIC SCREENING ORDER

## 2023-04-03 NOTE — Progress Notes (Signed)
 REFERRING PROVIDER: Ottie Glazier, MD  PRIMARY PROVIDER:  de [place], Raymond J, MD  PRIMARY REASON FOR VISIT:  1. History of colonic polyps   2. Family history of malignant neoplasm of breast   3. Family history of malignant neoplasm of gastrointestinal tract   4. Family history of malignant neoplasm of prostate   5. Ashkenazi Jewish ancestry requiring population-specific genetic screening     HISTORY OF PRESENT ILLNESS:   Tricia [name], a 78 y.o. female, was seen for a [facility] cancer genetics consultation at the request of Dr. Doy Hutching due to a personal history of colon polyps and family history of cancer.  Tricia [name] presents to clinic today to discuss the possibility of a hereditary predisposition to cancer, genetic testing, and to further clarify her future cancer risks, as well as potential cancer risks for family members.   Tricia [name] is a 78 y.o. female with no personal history of cancer. She has been diagnosed with approximately 19 tubular adenoma, over the course of several colonoscopies.   Colonoscopy 04/2020 - 6 tubular adenomas Colonoscopy 04/2017 - 4 tubular adenomas  Colonoscopy 10/2012 - 4 tubular adenomas (6 polyps total) Colonoscopy 08/2009 - 3 adenomatous polyps  Colonoscopy 02/2004 - 2 adenomatous polyps   RISK FACTORS:  Menarche was at age 89.  First live birth at age 47.  OCP use for approximately 3 years.  Ovaries intact: unsure, cannot recall if removed with hysterectomy in 1997.  Hysterectomy: yes.  Menopausal status: postmenopausal.  HRT use: 0 years. Colonoscopy: yes;  see above . Mammogram within the last year: yes. Number of breast biopsies: 0. Any excessive radiation exposure in the past: no  Past Medical History:  Diagnosis Date   Adenomatous polyp of colon 02/2004   Allergy 2000   Sulfur   Anemia    Anxiety    Arthritis    B12 deficiency    CAD (coronary artery disease)    a. s/p remote BMS to LAD;  b. 09/2015 Inf STEMI/VF Arrest: LM nl, LAD 85p  (staged PCI 2 days later w/ 3.0x32 Synergy DES), 40p/m, 25d, LCX 36m, RCA 80ost/100p (2.75x32 Synergy DES), 20m, EF 55-65%. // c. Myoview 2/18: EF 59, no ischemia or infarction; Normal study // Myoview 3/22: EF 70, no ischemia or infarction; low risk   Chronic kidney disease, stage 3, mod decreased GFR (HCC) 03/09/2020   COPD (chronic obstructive pulmonary disease) (HCC) 2018   Chest X-ray   Dermatophytosis of groin and perianal area    Diet Controlled Diabetes Mellitus    Diverticulosis    GERD (gastroesophageal reflux disease)    H/O echocardiogram    a. 09/2015 Echo: EF 60-65%, no rwma, mild AI, mildly dil RA, mild to mod TR.   History of echocardiogram    Echo 3/22: EF 60-65, no RWMA, GR 1 DD, normal RVSF, mild LAE, RVSP 35.5, mild to moderate TR, trivial AI   Hyperlipidemia    Hypertension    Hypertensive heart disease    Low back pain    l5 disc   Lumbar spinal stenosis 03/03/2019   Morbid obesity (HCC)    Myocardial infarction (HCC) 2001, 2017   Neuromuscular disorder (HCC)    Osteoporosis    PVC's (premature ventricular contractions) 04/16/2017   Holter 3/19: NSR, average heart rate 69, frequent PVCs (5% total beats), rare supraventricular ectopics, no AF/flutter   Sleep apnea 10/03/2009   Resolved after gastric bypass     TOBACCO ABUSE 10/05/2009   Qualifier: Diagnosis  of  By: Jens Som, MD, Lyn Hollingshead    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 In setting of inferior STEMI.    Past Surgical History:  Procedure Laterality Date   ABDOMINAL HYSTERECTOMY     APPENDECTOMY     BARIATRIC SURGERY     CARDIAC CATHETERIZATION N/A 10/01/2015   Procedure: Left Heart Cath and Coronary Angiography;  Surgeon: Marykay Lex, MD;  Location: [facility] INVASIVE CV LAB;  Service: Cardiovascular;  Laterality: N/A;   CARDIAC CATHETERIZATION N/A 10/01/2015   Procedure: Coronary Stent Intervention;  Surgeon: Marykay Lex, MD;  Location: [facility] INVASIVE CV LAB;  Service:  Cardiovascular;  Laterality: N/A;   CARDIAC CATHETERIZATION N/A 10/03/2015   Procedure: Coronary Stent Intervention;  Surgeon: Lennette Bihari, MD;  Location: [facility] INVASIVE CV LAB;  Service: Cardiovascular;  Laterality: N/A;   CARDIAC CATHETERIZATION N/A 10/03/2015   Procedure: Coronary/Graft Angiography;  Surgeon: Lennette Bihari, MD;  Location: [facility] INVASIVE CV LAB;  Service: Cardiovascular;  Laterality: N/A;   CARPAL TUNNEL RELEASE Bilateral    CERVICAL DISC SURGERY     CHOLECYSTECTOMY     CORONARY ANGIOPLASTY  2002   EYE SURGERY  2020 January   Cataracts   HERNIA REPAIR     JOINT REPLACEMENT  2003   Knee   LUMBAR LAMINECTOMY     L3-4   NEPHRECTOMY Right    Partial   SPINE SURGERY     C1   TONSILLECTOMY     TOTAL KNEE ARTHROPLASTY Left    TUBAL LIGATION     ULNAR NERVE REPAIR Left     Social History   Socioeconomic History   Marital status: Widowed    Spouse name: Not on file   Number of children: Not on file   Years of education: Not on file   Highest education level: Associate degree: occupational, Scientist, product/process development, or vocational program  Occupational History   Occupation: retired    Associate Professor: UNEMPLOYED  Tobacco Use   Smoking status: Former    Current packs/day: 0.00    Average packs/day: 2.0 packs/day for 41.0 years (82.0 ttl pk-yrs)    Types: Cigarettes    Start date: 08/04/1974    Quit date: 08/04/2015    Years since quitting: 7.6    Passive exposure: Past   Smokeless tobacco: Never   Tobacco comments:    Quit 2000 started again 2008, quit again 2011, started again 2014  Vaping Use   Vaping status: Never Used  Substance and Sexual Activity   Alcohol use: No   Drug use: No   Sexual activity: Not Currently    Birth control/protection: Post-menopausal  Other Topics Concern   Not on file  Social History Narrative   Widowed in 04/2012. 2 children. 3 grandkids. Lives alone. Completely indendent. Disabled/retired. Disabled-lifted computer paper. Hobbies: spend money-gamble.          Are you right handed or left handed? Right Handed    Are you currently employed ? No    What is your current occupation?   Do you live at home alone? Yes   Who lives with you?    What type of home do you live in: 1 story or 2 story? Lives in a one story home        Social Drivers of Health   Financial Resource Strain: Medium Risk (03/02/2023)   Overall Financial Resource Strain (CARDIA)    Difficulty of Paying Living Expenses: Somewhat hard  Food Insecurity: Food Insecurity Present (03/02/2023)  Hunger Vital Sign    Worried About Running Out of Food in the Last Year: Sometimes true    Ran Out of Food in the Last Year: Never true  Transportation Needs: No Transportation Needs (03/02/2023)   PRAPARE - Administrator, Civil Service (Medical): No    Lack of Transportation (Non-Medical): No  Physical Activity: Inactive (03/02/2023)   Exercise Vital Sign    Days of Exercise per Week: 0 days    Minutes of Exercise per Session: 0 min  Stress: No Stress Concern Present (03/02/2023)   [employer] of Occupational Health - Occupational Stress Questionnaire    Feeling of Stress : Not at all  Social Connections: Socially Isolated (03/02/2023)   Social Connection and Isolation Panel [NHANES]    Frequency of Communication with Friends and Family: More than three times a week    Frequency of Social Gatherings with Friends and Family: Once a week    Attends Religious Services: Never    Database administrator or Organizations: No    Attends Banker Meetings: Never    Marital Status: Widowed     FAMILY HISTORY:  We obtained a detailed, 4-generation family history.  Significant diagnoses are listed below: Family History  Problem Relation Age of Onset   Colitis Mother        sepsis from c dif colitis   Melanoma Mother    COPD Mother    Diabetes Mother    Heart disease Mother    Hyperlipidemia Mother    Hypertension Mother    Heart disease Father     Early death Father    Diabetes Sister    Early death Sister    Learning disabilities Sister    Stomach cancer Brother 68   Stomach cancer Maternal Aunt    Vision loss Maternal Aunt    Lung cancer Maternal Uncle    Cancer Maternal Uncle    Prostate cancer Maternal Uncle        metastatic   Lung cancer Maternal Uncle    Lung cancer Maternal Grandmother    Stomach cancer Paternal Grandmother    Hearing loss Paternal Grandfather    Hyperlipidemia Paternal Grandfather    Hypertension Paternal Grandfather    Stroke Paternal Grandfather    Learning disabilities Daughter    Breast cancer Cousin    Colon cancer Neg Hx    Esophageal cancer Neg Hx    Rectal cancer Neg Hx     Ms. Mis is unaware of previous family history of genetic testing for hereditary cancer risks. Patient reports Ashkenazi Jewish ancestry on both sides of her family. There is no known consanguinity.  Tricia [name] reports her son, age 36, has been diagnosed with skin cancer on his face and two sessile polyps, pathology unknown. She reports her daughter, age 49, has been diagnosed with skin cancer on her back. Tricia [name] reports her paternal half brother was diagnosed with stomach cancer at age 54, living at age 93. She reports a paternal first cousin diagnosed with breast cancer in her 62s, deceased at age 81. She reports her paternal grandmother was diagnosed with stomach cancer in her 45s, deceased at age 63. Paternal grandmother's mother was diagnosed with stomach caner and her father with lung cancer. Paternal grandfather's father reported to have stomach cancer, deceased atage 46. Tricia [name] reports her mother was diagnosed with melanoma in her 61s on both legs, passed away from unrelated causes at age 41. She reports a  maternal uncle diagnosed with metastatic prostate cancer, deceased at age 31. She reports a maternal uncle diagnosed with lung cancer and a genetic condition associated with "tall stature and heart concerns,"  deceased due to cancer at age 15. His grandson was reportedly diagnosed with the same genetic condition that was associated with "tall/large stature and an enlarged heart", possibly Marfan syndrome. She reports a maternal uncle diagnosed with lung cancer, deceased at 72. She reports a maternal aunt diagnosed with stomach cancer, deceased at age 78. Her son was diagnosed with brain cancer, deceased at age 67. She reports her maternal grandmother was diagnosed with lung cancer, deceased at age 59. Tricia [name] does not report any additional family history of colon polyps or cancer.     GENETIC COUNSELING ASSESSMENT: Tricia [name] is a 78 y.o. female with a personal and family history of colon polyps and cancer which is somewhat suggestive of an inherited predisposition to cancer. We, therefore, discussed and recommended the following at today's visit.   DISCUSSION: We discussed that, in general, most cancer is not inherited in families, but instead is sporadic or familial. Sporadic cancers occur by chance and typically happen at older ages (>50 years) as this type of cancer is caused by genetic changes acquired during an individual's lifetime. Some families have more cancers than would be expected by chance; however, the ages or types of cancer are not consistent with a known genetic mutation or known genetic mutations have been ruled out. This type of familial cancer is thought to be due to a combination of multiple genetic, environmental, hormonal, and lifestyle factors. While this combination of factors likely increases the risk of cancer, the exact source of this risk is not currently identifiable or testable.  We discussed that 5 - 10% of cancer and colon polyps are hereditary. We discussed that testing is beneficial for several reasons including knowing how to best screen individuals for colon polyps and cancer, identifying treatment options for individuals diagnosed with cancer, and to understand if other  family members could be at risk for colon polyps and/or cancer and allow them to undergo genetic testing.   We reviewed the characteristics, features and inheritance patterns of hereditary cancer syndromes. We also discussed genetic testing, including the appropriate family members to test, the process of testing, insurance coverage and turn-around-time for results. We discussed the implications of a negative, positive, carrier and/or variant of uncertain significant result. Tricia [name]  was offered a common hereditary cancer panel (36+ genes) and an expanded pan-cancer panel (70+ genes). Tricia [name] was informed of the benefits and limitations of each panel, including that expanded pan-cancer panels contain genes that do not have clear management guidelines at this point in time.  We also discussed that as the number of genes included on a panel increases, the chances of variants of uncertain significance increases. Tricia [name] decided to pursue genetic testing for the 55 gene CancerNext-Expanded +RNAinsight panel. The CancerNext-Expanded gene panel offered by [facility] and includes sequencing, rearrangement, and RNA analysis for the following 76 genes: AIP, ALK, APC, ATM, AXIN2, BAP1, BARD1, BMPR1A, BRCA1, BRCA2, BRIP1, CDC73, CDH1, CDK4, CDKN1B, CDKN2A, CEBPA, CHEK2, CTNNA1, DDX41, DICER1, ETV6, FH, FLCN, GATA2, LZTR1, MAX, MBD4, MEN1, MET, MLH1, MSH2, MSH3, MSH6, MUTYH, NF1, NF2, NTHL1, PALB2, PHOX2B, PMS2, POT1, PRKAR1A, PTCH1, PTEN, RAD51C, RAD51D, RB1, RET, RUNX1, SDHA, SDHAF2, SDHB, SDHC, SDHD, SMAD4, SMARCA4, SMARCB1, SMARCE1, STK11, SUFU, TMEM127, TP53, TSC1, TSC2, VHL, and WT1 (sequencing and deletion/duplication); EGFR, HOXB13, KIT, MITF, PDGFRA, POLD1,  and POLE (sequencing only); EPCAM and GREM1 (deletion/duplication only).   Based on Tricia [name]'s personal and family history of colon polyps and cancer, she meets medical criteria for genetic testing. Despite that she meets criteria, she may still have  an out of pocket cost. We discussed that if her out of pocket cost for testing is over $100, the laboratory will call and confirm whether she wants to proceed with testing.  If the out of pocket cost of testing is less than $100 she will be billed by the genetic testing laboratory.   We discussed that some people do not want to undergo genetic testing due to fear of genetic discrimination.  The Genetic Information Nondiscrimination Act (GINA) was signed into federal law in 2008. GINA prohibits health insurers and most employers from discriminating against individuals based on genetic information (including the results of genetic tests and family history information). According to GINA, health insurance companies cannot consider genetic information to be a preexisting condition, nor can they use it to make decisions regarding coverage or rates. GINA also makes it illegal for most employers to use genetic information in making decisions about hiring, firing, promotion, or terms of employment. It is important to note that GINA does not offer protections for life insurance, disability insurance, or long-term care insurance. GINA does not apply to those in the [employer], those who work for companies with less than 15 employees, and new life insurance or long-term disability insurance policies.  Health status due to a cancer diagnosis is not protected under GINA. More information about GINA can be found by visiting [email or web address].  We reviewed the characteristics, features and inheritance patterns of hereditary cancer syndromes. We also discussed genetic testing, including the appropriate family members to test, the process of testing, insurance coverage and turn-around-time for results. We discussed the implications of a negative, positive and/or variant of uncertain significant result.   Based on Tricia [name]'s personal and family history of cancer, she meets medical criteria for genetic testing. Despite that she  meets criteria, she may still have an out of pocket cost.   PLAN: After considering the risks, benefits, and limitations, Tricia [name] provided informed consent to pursue genetic testing and the blood sample was sent to [facility] for analysis of the CancerNext-Expanded +RNAinsight. Results should be available within approximately 3 weeks' time, at which point they will be disclosed by telephone to Tricia [name], as will any additional recommendations warranted by these results. Tricia [name] will receive a summary of her genetic counseling visit and a copy of her results once available. This information will also be available in Epic.   Lastly, we encouraged Tricia [name] to remain in contact with cancer genetics annually so that we can continuously update the family history and inform her of any changes in cancer genetics and testing that may be of benefit for this family.   Tricia [name] questions were answered to her satisfaction today. Our contact information was provided should additional questions or concerns arise. Thank you for the referral and allowing [place] to share in the care of your patient.   Vassie Moment, MS, [facility] Licensed, Retail [email or web address] phone: [phone]  50 minutes were spent on the date of the encounter in service to the patient including preparation, face-to-face consultation, documentation and care coordination. The patient was seen alone. Drs. Meliton Rattan, and/or Paint were available for questions, if needed..   _______________________________________________________________________ For Office Staff:  Number of people involved in session: 1 Was an  Intern/ student involved with case: no

## 2023-04-06 ENCOUNTER — Other Ambulatory Visit: Payer: Self-pay

## 2023-04-06 LAB — LAB REPORT - SCANNED
Albumin, Urine POC: 16.5
Creatinine, POC: 80.4 mg/dL
Microalb Creat Ratio: 21

## 2023-04-07 ENCOUNTER — Other Ambulatory Visit: Payer: Self-pay

## 2023-04-07 ENCOUNTER — Ambulatory Visit (HOSPITAL_COMMUNITY): Payer: HMO | Attending: Cardiology

## 2023-04-07 DIAGNOSIS — R0602 Shortness of breath: Secondary | ICD-10-CM | POA: Diagnosis not present

## 2023-04-07 DIAGNOSIS — I25118 Atherosclerotic heart disease of native coronary artery with other forms of angina pectoris: Secondary | ICD-10-CM | POA: Insufficient documentation

## 2023-04-07 DIAGNOSIS — E785 Hyperlipidemia, unspecified: Secondary | ICD-10-CM | POA: Insufficient documentation

## 2023-04-07 DIAGNOSIS — I152 Hypertension secondary to endocrine disorders: Secondary | ICD-10-CM | POA: Diagnosis not present

## 2023-04-07 DIAGNOSIS — Z01810 Encounter for preprocedural cardiovascular examination: Secondary | ICD-10-CM | POA: Insufficient documentation

## 2023-04-07 DIAGNOSIS — E1159 Type 2 diabetes mellitus with other circulatory complications: Secondary | ICD-10-CM | POA: Diagnosis not present

## 2023-04-07 DIAGNOSIS — I493 Ventricular premature depolarization: Secondary | ICD-10-CM | POA: Diagnosis not present

## 2023-04-07 LAB — MYOCARDIAL PERFUSION IMAGING
LV dias vol: 51 mL (ref 46–106)
LV sys vol: 13 mL
Nuc Stress EF: 75 %
Peak HR: 93 {beats}/min
Rest HR: 68 {beats}/min
Rest Nuclear Isotope Dose: 10.8 mCi
SDS: 3
SRS: 2
SSS: 5
ST Depression (mm): 0 mm
Stress Nuclear Isotope Dose: 30.1 mCi
TID: 0.94

## 2023-04-07 MED ORDER — TECHNETIUM TC 99M TETROFOSMIN IV KIT
10.8000 | PACK | Freq: Once | INTRAVENOUS | Status: AC | PRN
Start: 2023-04-07 — End: 2023-04-07
  Administered 2023-04-07: 10.8 via INTRAVENOUS

## 2023-04-07 MED ORDER — REGADENOSON 0.4 MG/5ML IV SOLN
0.4000 mg | Freq: Once | INTRAVENOUS | Status: AC
  Administered 2023-04-07: 0.4 mg via INTRAVENOUS

## 2023-04-07 MED ORDER — TECHNETIUM TC 99M TETROFOSMIN IV KIT
30.1000 | PACK | Freq: Once | INTRAVENOUS | Status: AC | PRN
  Administered 2023-04-07: 30.1 via INTRAVENOUS

## 2023-04-08 ENCOUNTER — Other Ambulatory Visit (HOSPITAL_COMMUNITY): Payer: Self-pay

## 2023-04-09 ENCOUNTER — Telehealth: Payer: Self-pay | Admitting: Genetic Counselor

## 2023-04-09 NOTE — Telephone Encounter (Signed)
 Per an In basket message received I called this patient to schedule [employer] appointment. Patient stated that they completed their appt last week.

## 2023-04-13 ENCOUNTER — Other Ambulatory Visit (HOSPITAL_COMMUNITY): Payer: Self-pay

## 2023-04-13 ENCOUNTER — Telehealth: Payer: Self-pay

## 2023-04-13 NOTE — Telephone Encounter (Signed)
 Left message to call back

## 2023-04-13 NOTE — Telephone Encounter (Signed)
-----   Message from Rip Harbour sent at 04/09/2023  5:29 PM EST ----- Please let Ms. Vogler know that her stress test was normal, there is no evidence of decreased blood flow to the heart.  Good result!  Proceed with echocardiogram as planned.

## 2023-04-14 NOTE — Telephone Encounter (Signed)
Pt returning call from yesterday regarding results. Please advise

## 2023-04-14 NOTE — Telephone Encounter (Signed)
 Rip Harbour, NP 04/09/2023  5:29 PM EST     Please let Ms. Feigenbaum know that her stress test was normal, there is no evidence of decreased blood flow to the heart.  Good result!  Proceed with echocardiogram as planned.     Spoke with pt regarding the results of her stress test per Reather Littler, NP. Pt will proceed with echocardiogram. Pt would like to make sure that clearance will be sent to [facility] once completed. Pt has no further questions at this time.

## 2023-04-15 ENCOUNTER — Other Ambulatory Visit (HOSPITAL_COMMUNITY): Payer: Self-pay

## 2023-04-15 ENCOUNTER — Other Ambulatory Visit: Payer: Self-pay

## 2023-04-16 ENCOUNTER — Other Ambulatory Visit (HOSPITAL_COMMUNITY): Payer: Self-pay

## 2023-04-16 ENCOUNTER — Other Ambulatory Visit: Payer: Self-pay

## 2023-04-16 MED ORDER — SUFLAVE 178.7 G PO SOLR
ORAL | 0 refills | Status: DC
  Filled 2023-04-16: qty 2, 5d supply, fill #0

## 2023-04-16 NOTE — Telephone Encounter (Signed)
 Tricia [name] has an echocardiogram scheduled for 3/26.  Her physician states that clearance will be decided after that visit.

## 2023-04-17 ENCOUNTER — Other Ambulatory Visit: Payer: Self-pay

## 2023-04-17 ENCOUNTER — Other Ambulatory Visit (HOSPITAL_COMMUNITY): Payer: Self-pay

## 2023-04-20 ENCOUNTER — Encounter: Payer: Self-pay | Admitting: Genetic Counselor

## 2023-04-20 ENCOUNTER — Telehealth: Payer: Self-pay | Admitting: Genetic Counselor

## 2023-04-20 ENCOUNTER — Other Ambulatory Visit (HOSPITAL_COMMUNITY): Payer: Self-pay

## 2023-04-20 ENCOUNTER — Ambulatory Visit: Payer: Self-pay | Admitting: Genetic Counselor

## 2023-04-20 DIAGNOSIS — Z1379 Encounter for other screening for genetic and chromosomal anomalies: Secondary | ICD-10-CM

## 2023-04-20 HISTORY — DX: Encounter for other screening for genetic and chromosomal anomalies: Z13.79

## 2023-04-20 NOTE — Progress Notes (Signed)
 GENETIC TEST RESULTS   Patient Name: Tricia [name] Patient Age: 78 y.o. Encounter Date: 04/20/2023  Referring Provider: Maren Beach, MD   Ms. Tricia [name] was seen in the Cancer Genetics clinic on 04/03/23 due to a personal history of colon polyps and concern regarding a hereditary predisposition to cancer in the family. Please refer to the prior Genetics clinic note for more information regarding Ms. Tricia [name]'s medical and family histories and our assessment at the time. Results disclosed to Ms. Tricia [name] via phone on 04/20/23.    FAMILY HISTORY:  We obtained a detailed, 4-generation family history.  Significant diagnoses are listed below:  Family History  Problem Relation Age of Onset   Colitis Mother        sepsis from c dif colitis   Melanoma Mother    COPD Mother    Diabetes Mother    Heart disease Mother    Hyperlipidemia Mother    Hypertension Mother    Heart disease Father    Early death Father    Diabetes Sister    Early death Sister    Learning disabilities Sister    Stomach cancer Brother 30   Stomach cancer Maternal Aunt    Vision loss Maternal Aunt    Lung cancer Maternal Uncle    Cancer Maternal Uncle    Prostate cancer Maternal Uncle        metastatic   Lung cancer Maternal Uncle    Lung cancer Maternal Grandmother    Stomach cancer Paternal Grandmother    Hearing loss Paternal Grandfather    Hyperlipidemia Paternal Grandfather    Hypertension Paternal Grandfather    Stroke Paternal Grandfather    Learning disabilities Daughter    Breast cancer Cousin    Colon cancer Neg Hx    Esophageal cancer Neg Hx    Rectal cancer Neg Hx    Ms. Tricia [name] is unaware of previous family history of genetic testing for hereditary cancer risks. Patient reports Ashkenazi Jewish ancestry on both sides of her family. There is no known consanguinity.   Ms. Tricia [name] reports her son, age 60, has been diagnosed with skin cancer on his face and two sessile polyps, pathology unknown. She reports her  daughter, age 68, has been diagnosed with skin cancer on her back. Ms. Tricia [name] reports her paternal half brother was diagnosed with stomach cancer at age 84, living at age 42. She reports a paternal first cousin diagnosed with breast cancer in her 81s, deceased at age 83. She reports her paternal grandmother was diagnosed with stomach cancer in her 6s, deceased at age 61. Paternal grandmother's mother was diagnosed with stomach cancer and her father with lung cancer. Paternal grandfather's father reported to have stomach cancer, deceased atage 11. Ms. Tricia [name] reports her mother was diagnosed with melanoma in her 39s on both legs, passed away from unrelated causes at age 52. She reports a maternal uncle diagnosed with metastatic prostate cancer, deceased at age 67. She reports a maternal uncle diagnosed with lung cancer and a genetic condition associated with "tall stature and heart concerns," deceased due to cancer at age 41. His grandson was reportedly diagnosed with the same genetic condition that was associated with "tall/large stature and an enlarged heart", possibly Marfan syndrome. She reports a maternal uncle diagnosed with lung cancer, deceased at 49. She reports a maternal aunt diagnosed with stomach cancer, deceased at age 62. Her son was diagnosed with brain cancer, deceased at age 69. She reports her maternal grandmother was diagnosed with  lung cancer, deceased at age 63. Ms. Tricia [name] does not report any additional family history of colon polyps or cancer.       GENETIC TESTING:  Genetic testing reported out on 04/16/23 through the [facility] 76 gene CancerNext-Expanded +RNAinsight panel. 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   The APC I1307K mutation is a well-described moderate-risk mutation that is not associated with Familial Adenomatous Polyposis, but does confer a moderately-increased lifetime risk for colorectal cancer.   The CancerNext-Expanded gene panel offered  by [facility] and includes sequencing, rearrangement, and RNA analysis for the following 76 genes: AIP, ALK, APC, ATM, AXIN2, BAP1, BARD1, BMPR1A, BRCA1, BRCA2, BRIP1, CDC73, CDH1, CDK4, CDKN1B, CDKN2A, CEBPA, CHEK2, CTNNA1, DDX41, DICER1, ETV6, FH, FLCN, GATA2, LZTR1, MAX, MBD4, MEN1, MET, MLH1, MSH2, MSH3, MSH6, MUTYH, NF1, NF2, NTHL1, PALB2, PHOX2B, PMS2, POT1, PRKAR1A, PTCH1, PTEN, RAD51C, RAD51D, RB1, RET, RUNX1, SDHA, SDHAF2, SDHB, SDHC, SDHD, SMAD4, SMARCA4, SMARCB1, SMARCE1, STK11, SUFU, TMEM127, TP53, TSC1, TSC2, VHL, and WT1 (sequencing and deletion/duplication); EGFR, HOXB13, KIT, MITF, PDGFRA, POLD1, and POLE (sequencing only); EPCAM and GREM1 (deletion/duplication only).   The test report will be scanned into EPIC and located under the Molecular Pathology section of the Results Review tab.  A portion of the result report is included below for reference.      Of note, this APC variant does not explain the personal history of polyposis or family history of cancer at this point in time.  We discussed with Ms. Tricia [name] that because current genetic testing is not perfect, it is possible there may be a gene mutation in one of these genes that current testing cannot detect, but that chance is small.  We also discussed, that there could be another gene that has not yet been discovered, or that we have not yet tested, that is responsible for the cancer diagnoses in the family. It is also possible there is a hereditary cause for the cancer in the family that Ms. Tricia [name] did not inherit and therefore was not identified in her testing.  Therefore, it is important to remain in touch with cancer genetics in the future so that we can continue to offer Ms. Tricia [name] the most up to date genetic testing.   APC I1307K CANCER RISKS & RECOMMENDATIONS:  The I1307K mutation in the APC gene is considered to be a founder mutation in the Ashkenazi Jewish population. Approximately 6-10% of the Ashkenazi Jewish population are  carriers of this mutation, although individuals of non-Jewish ancestry can also have this mutation.   Importantly, the I1307K mutation is different than other mutations in the APC gene. Most mutations in the APC gene cause a rare genetic condition called Familial Adenomatous Polyposis (FAP), which is characterized by a very high risk for colorectal cancer (up to 100%) and the development of hundreds to thousands of colon polyps. The APC I1307K mutation does not cause FAP.  The APC I1307K mutation is considered a "moderate risk mutation", with studies showing that it increases the risk to develop colorectal cancer by approximately 2-fold (approximately a 5-10% estimated absolute risk). Therefore, it is recommended that carriers of this mutation have more frequent colorectal cancer screening beginning at younger ages than the general population. Current NCCN Guidelines (Genetic/Familial High-Risk Assessment: Colorectal, Version 3.2024) recommend the following management for APC I1307K carriers:    For individuals affected by colorectal cancer:  Follow surveillance recommendations made by GI/oncology providers for post-colorectal cancer resection. For individuals unaffected by colorectal cancer with  a first-degree relative with colorectal cancer:  High-quality colonoscopy every 5 years (or more often as recommended by a GI provider) beginning at age 55, or 66 years younger than the earliest age of colorectal cancer onset in a first-degree relative, whichever is younger. For individuals unaffected by colorectal cancer and no first-degree relative affected by colorectal cancer:  High-quality colonoscopy screening every 5 years (or more often as recommended by a GI provider) beginning at age 7.   FAMILY MEMBERS:  Ms. Shands siblings and children each have a 50% (1 in 2) chance to have inherited the same APC mutation. It is recommended that once these family members are adults, they be offered genetic testing  to determine if they need to undergo increased surveillance for colon cancer. We would be happy to help meet with and coordinate genetic testing for any relative that is interested.   Lastly, we discussed that our knowledge of cancer risks related to APC I1307K mutations will likely continue to evolve. We recommended that Ms. Hovanec follow up with the genetics clinic annually so we can provide her with the most current information about APC I1307K and cancer risk, as well as with any changes to her family history (e.g. new cancer diagnoses, genetic test results).  Our contact number was provided. Ms. Raney questions were answered to her satisfaction, and she knows she is welcome to call us at anytime with additional questions or concerns.   Vassie Moment, MS, [facility] Licensed Certified Advertising [email or web address] (P) [phone]

## 2023-04-20 NOTE — Telephone Encounter (Signed)
 I spoke to Tricia [name] to review results of genetic testing. She had genetic testing with [facility]'s CancerNext-Expanded +RNAinsight panel. Testing did identify a moderate risk mutation in APC, p.I1307K. This result is consistent with a moderately increased lifetime colorectal cancer risk (about 5-10%), not classic or attenuated FAP. This mutation is not typically associated with polyposis.   Per NCCN, for people who carry the APC, p.I1307K variant that do not have a personal history of colorectal cancer, it is recommended to begin colonoscopies ar 40 (or 10 years prior to the age of a first degree relatives diagnosis with colorectal cancer) and repeat every 4 years, or as recommended by their GI provider.   Discussed autosomal dominant inheritance and 50% risk to first degree relatives to carry the same mutation. Testing recommended.   Please see counseling note for further detail on this result.

## 2023-04-22 ENCOUNTER — Encounter: Payer: Self-pay | Admitting: Genetic Counselor

## 2023-04-24 ENCOUNTER — Other Ambulatory Visit (HOSPITAL_BASED_OUTPATIENT_CLINIC_OR_DEPARTMENT_OTHER): Payer: Self-pay | Admitting: Family

## 2023-04-24 ENCOUNTER — Other Ambulatory Visit (HOSPITAL_COMMUNITY): Payer: Self-pay

## 2023-04-24 ENCOUNTER — Other Ambulatory Visit: Payer: Self-pay

## 2023-04-24 DIAGNOSIS — I152 Hypertension secondary to endocrine disorders: Secondary | ICD-10-CM

## 2023-04-24 MED ORDER — VALSARTAN 320 MG PO TABS
320.0000 mg | ORAL_TABLET | Freq: Every day | ORAL | 0 refills | Status: DC
  Filled 2023-05-12: qty 30, 30d supply, fill #0
  Filled 2023-05-26 – 2023-06-09 (×2): qty 30, 30d supply, fill #1
  Filled 2023-06-26 – 2023-07-28 (×2): qty 30, 30d supply, fill #2

## 2023-04-28 ENCOUNTER — Encounter (HOSPITAL_BASED_OUTPATIENT_CLINIC_OR_DEPARTMENT_OTHER): Payer: HMO

## 2023-04-29 ENCOUNTER — Other Ambulatory Visit (HOSPITAL_BASED_OUTPATIENT_CLINIC_OR_DEPARTMENT_OTHER): Payer: Self-pay

## 2023-04-29 ENCOUNTER — Encounter (HOSPITAL_BASED_OUTPATIENT_CLINIC_OR_DEPARTMENT_OTHER): Payer: Self-pay | Admitting: Radiology

## 2023-04-29 ENCOUNTER — Other Ambulatory Visit (HOSPITAL_BASED_OUTPATIENT_CLINIC_OR_DEPARTMENT_OTHER): Payer: PPO

## 2023-04-29 ENCOUNTER — Inpatient Hospital Stay (HOSPITAL_BASED_OUTPATIENT_CLINIC_OR_DEPARTMENT_OTHER): Admission: RE | Admit: 2023-04-29 | Payer: PPO | Source: Ambulatory Visit

## 2023-04-29 ENCOUNTER — Ambulatory Visit (HOSPITAL_BASED_OUTPATIENT_CLINIC_OR_DEPARTMENT_OTHER)
Admission: RE | Admit: 2023-04-29 | Discharge: 2023-04-29 | Disposition: A | Discharge status: Home / Self Care | Payer: PPO | Source: Ambulatory Visit | Attending: Family Medicine | Admitting: Family Medicine

## 2023-04-29 ENCOUNTER — Ambulatory Visit (INDEPENDENT_AMBULATORY_CARE_PROVIDER_SITE_OTHER): Payer: HMO

## 2023-04-29 ENCOUNTER — Ambulatory Visit (HOSPITAL_BASED_OUTPATIENT_CLINIC_OR_DEPARTMENT_OTHER)
Admission: RE | Admit: 2023-04-29 | Discharge: 2023-04-29 | Disposition: A | Discharge status: Home / Self Care | Payer: PPO | Source: Ambulatory Visit | Attending: Acute Care | Admitting: Acute Care

## 2023-04-29 DIAGNOSIS — Z87891 Personal history of nicotine dependence: Secondary | ICD-10-CM | POA: Insufficient documentation

## 2023-04-29 DIAGNOSIS — I25118 Atherosclerotic heart disease of native coronary artery with other forms of angina pectoris: Secondary | ICD-10-CM | POA: Diagnosis not present

## 2023-04-29 DIAGNOSIS — E1159 Type 2 diabetes mellitus with other circulatory complications: Secondary | ICD-10-CM | POA: Diagnosis not present

## 2023-04-29 DIAGNOSIS — Z1231 Encounter for screening mammogram for malignant neoplasm of breast: Secondary | ICD-10-CM | POA: Insufficient documentation

## 2023-04-29 DIAGNOSIS — E785 Hyperlipidemia, unspecified: Secondary | ICD-10-CM

## 2023-04-29 DIAGNOSIS — Z01810 Encounter for preprocedural cardiovascular examination: Secondary | ICD-10-CM

## 2023-04-29 DIAGNOSIS — I152 Hypertension secondary to endocrine disorders: Secondary | ICD-10-CM | POA: Diagnosis not present

## 2023-04-29 DIAGNOSIS — R0602 Shortness of breath: Secondary | ICD-10-CM | POA: Diagnosis not present

## 2023-04-29 DIAGNOSIS — I493 Ventricular premature depolarization: Secondary | ICD-10-CM

## 2023-04-29 LAB — ECHOCARDIOGRAM COMPLETE
Area-P 1/2: 3.6 cm2
S' Lateral: 3.53 cm

## 2023-04-29 MED ORDER — COMIRNATY 30 MCG/0.3ML IM SUSY
PREFILLED_SYRINGE | INTRAMUSCULAR | 0 refills | Status: DC
  Filled 2023-04-29: qty 0.3, 1d supply, fill #0

## 2023-04-30 ENCOUNTER — Encounter (HOSPITAL_BASED_OUTPATIENT_CLINIC_OR_DEPARTMENT_OTHER): Payer: Self-pay | Admitting: Family Medicine

## 2023-04-30 ENCOUNTER — Telehealth: Payer: Self-pay | Admitting: Cardiovascular Disease

## 2023-04-30 ENCOUNTER — Encounter: Payer: Self-pay | Admitting: Pediatrics

## 2023-04-30 NOTE — Telephone Encounter (Signed)
 I spoke to [place] at [facility] and I told her that we were waiting for clearance to be given for Judy's EGD/ Colon on 05/08/23.   She asked if we received the office note from 2/25 and I advised her that I reviewed it in her chart and it stated that she was to undergo some additional tests and at that time her clearance would be decided.  She stated that's he will contact their preop team and have them notify us.

## 2023-04-30 NOTE — Telephone Encounter (Signed)
 [facility] is calling back to check on status of pre-op clearance

## 2023-04-30 NOTE — Telephone Encounter (Signed)
 Try reaching the office but no answer. I have sent a fax over of the cardiac updates to the fax number on file. I will try calling the office again later to see if they have received it.

## 2023-05-01 ENCOUNTER — Telehealth: Payer: Self-pay | Admitting: Pediatrics

## 2023-05-01 NOTE — Telephone Encounter (Signed)
 We received the fax for DOS 03/31/23.  Nothing states that she is cleared.  She was to have procedures this week and then given clearance.  Was she cleared yesterday to hold her Plavix 4 days prior to her procedure on 05/08/23?  She would need to start the hold on 4/30.  Please advise today.

## 2023-05-01 NOTE — Telephone Encounter (Signed)
 [facility] calling back they need more information on clearance. Please advise

## 2023-05-01 NOTE — Telephone Encounter (Signed)
 PT is calling to find out when she hold Plavix  for procdure 4/4. Please advise.

## 2023-05-01 NOTE — Telephone Encounter (Signed)
 Inbound call from patient inquiring how soon should she hold plavix. Please advise

## 2023-05-01 NOTE — Telephone Encounter (Signed)
 I spoke to Tricia [name] and I advised her that I had called the cardiologists office and no one had replied yet.  I apologized and advised her that we would need to reschedule her procedure to allow them additional time to respond.  [name] does not want to reschedule and said that she will hold her Plavix on Sunday in case they respond on Monday.  I told her that I will wait until Monday and I will call and update her.

## 2023-05-01 NOTE — Telephone Encounter (Signed)
 I'm sorry for the typo.  She needs to start her 5 day hold on 3/30 for her procedure on 05/08/23.

## 2023-05-01 NOTE — Telephone Encounter (Signed)
 Caller (Demita) is following-up on holding patient's Plavix which patient will need to start holding tomorrow (3/29) if patient is to have surgery on 4/4.

## 2023-05-04 NOTE — Telephone Encounter (Signed)
 Addendum made on 05/03/23 on providers 03/31/23 note stating that "Plavix can be held for 5 days prior to [facility]ice's colonoscopy and resumed ASAP post op, when felt to be safe. Patient should remain on aspirin 81mg  daily without interruption."

## 2023-05-04 NOTE — Telephone Encounter (Signed)
   Patient Name: Tricia [name]  DOB: 10-08-1945 MRN: [id number]  Primary Cardiologist: Tonny Bollman, MD  Chart reviewed as part of pre-operative protocol coverage. Pre-op clearance already addressed by colleagues in earlier phone notes. To summarize recommendations:  -Preoperative cardiac evaluation: Patient notes improved but ongoing dyspnea with exertion, notes 1 episode of chest discomfort 3 months ago requiring 1 sublingual nitroglycerin that resolved.  She is unable to meet 4 METS of activity.  Patient to undergo echocardiogram and nuclear stress test as noted below.  Patient will have follow-up after testing, preoperative cardiac evaluation will be addressed at that time.   ADDENDUM 05/03/23: Patient underwent nuclear stress test on 04/07/2023 that was normal and low risk.  Echocardiogram on 04/29/2023 was reassuring.  Patient can proceed with EGD/colonoscopy at acceptable cardiac risk. Clopidogrel (Plavix) can be held for 5 days prior to her colonoscopy and resumed as soon as possible post op, when felt to be safe.  Ideally patient should remain on aspirin 81 mg daily without interruption. -Reather Littler, NP  Will route this bundled recommendation to requesting provider via Epic fax function and remove from pre-op pool. Please call with questions.  Sharlene Dory, PA-C 05/04/2023, 11:32 AM

## 2023-05-04 NOTE — Telephone Encounter (Signed)
 Faxed over office visit note re: surgical clearance.

## 2023-05-04 NOTE — Telephone Encounter (Signed)
 I called Tricia [name] and I advised her that we received the clearance over the weekend.  She stated that she has not had the medication since Saturday and she is still taking her aspirin.  She also asked if it was ok for her to eat oatmeal and I advised her yes.

## 2023-05-05 ENCOUNTER — Telehealth (HOSPITAL_BASED_OUTPATIENT_CLINIC_OR_DEPARTMENT_OTHER): Payer: Self-pay | Admitting: *Deleted

## 2023-05-05 NOTE — Telephone Encounter (Signed)
 Patient responded to on 3/28 and confirmed on 3/31.  Noted in previous note.

## 2023-05-05 NOTE — Telephone Encounter (Signed)
 Received form from patient assistance asking if dosage of 1mg  ozempic will stay the same Please advise

## 2023-05-06 NOTE — Telephone Encounter (Signed)
 noted

## 2023-05-07 NOTE — Progress Notes (Unsigned)
 [facility] History and Physical   Primary Care Physician:  [place], Buren Kos, MD   Reason for Procedure:  GERD with LPR, history of multiple adenomatous colon polyps  Plan:    Upper endoscopy and colonoscopy     HPI: Tricia [name] is a 78 y.o. female undergoing upper endoscopy and colonoscopy for evaluation of GERD with LPR as well as a history of multiple adenomatous colon polyps.  Tricia [name] was seen in the office in February 2025 endorsing symptoms of regurgitation, burning in her throat and pyrosis.  No dysphagia or odynophagia.  She is on pantoprazole 40 mg p.o. twice daily but was not consistently taking it 20 to 30 minutes before meal which was advised.  EGD is performed for further evaluation of her reflux and to screen for Barrett's esophagus.  Last colonoscopy performed in 2022 revealed 6 polyps which were tubular adenomas, moderate diverticulosis in the left colon and internal hemorrhoids.  She has had a lifetime history of more than 10 tubular adenomas and was referred to genetics.  Patient takes Plavix and aspirin for a previous history of CAD and stents-last dose of Plavix**   Past Medical History:  Diagnosis Date   Adenomatous polyp of colon 02/2004   Allergy 2000   Sulfur   Anemia    Anxiety    Arthritis    B12 deficiency    CAD (coronary artery disease)    a. s/p remote BMS to LAD;  b. 09/2015 Inf STEMI/VF Arrest: LM nl, LAD 85p (staged PCI 2 days later w/ 3.0x32 Synergy DES), 40p/m, 25d, LCX 40m, RCA 80ost/100p (2.75x32 Synergy DES), 49m, EF 55-65%. // c. 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 [employer] 76 gene CancerNext-Expanded + RNAinsight panel otherwise normal. The CancerNext-Expanded gene panel offered by [employer] and includes sequencing, rearrangement, and RNA analysis for the following 76 genes: AIP, ALK, APC, ATM, AXIN2, BAP1, BARD1, BMPR1A, BRCA1, BRCA2, BRIP1, CDC73, CDH1, CDK4, CDKN1B, CDKN*   GERD (gastroesophageal reflux disease)    H/O echocardiogram    a. 09/2015 Echo: EF 60-65%, no rwma, mild AI, mildly dil RA, mild to mod TR.   History of echocardiogram    Echo 3/22: EF 60-65, no RWMA, GR 1 DD, normal RVSF, mild LAE, RVSP 35.5, mild to moderate TR, trivial AI   Hyperlipidemia    Hypertension    Hypertensive heart disease    Low back pain    l5 disc   Lumbar spinal stenosis 03/03/2019   Morbid obesity (HCC)    Myocardial infarction (HCC) 2001, 2017   Neuromuscular disorder (HCC)    Osteoporosis    PVC's (premature ventricular contractions) 04/16/2017   Holter 3/19: NSR, average heart rate 69, frequent PVCs (5% total beats), rare supraventricular ectopics, no AF/flutter   Sleep apnea 10/03/2009   Resolved after gastric bypass     TOBACCO ABUSE 10/05/2009   Qualifier: Diagnosis of  By: Jens Som, MD, Lyn Hollingshead    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 In setting of inferior STEMI.  Past Surgical History:  Procedure Laterality Date   ABDOMINAL HYSTERECTOMY     APPENDECTOMY     BARIATRIC SURGERY     CARDIAC CATHETERIZATION N/A 10/01/2015   Procedure: Left Heart Cath and Coronary Angiography;  Surgeon: Marykay Lex, MD;  Location: [facility] INVASIVE CV LAB;  Service: Cardiovascular;  Laterality: N/A;   CARDIAC CATHETERIZATION N/A 10/01/2015   Procedure: Coronary Stent Intervention;  Surgeon: Marykay Lex, MD;  Location: [facility] INVASIVE CV LAB;  Service: Cardiovascular;  Laterality: N/A;   CARDIAC CATHETERIZATION N/A 10/03/2015   Procedure: Coronary Stent  Intervention;  Surgeon: Lennette Bihari, MD;  Location: [facility] INVASIVE CV LAB;  Service: Cardiovascular;  Laterality: N/A;   CARDIAC CATHETERIZATION N/A 10/03/2015   Procedure: Coronary/Graft Angiography;  Surgeon: Lennette Bihari, MD;  Location: [facility] INVASIVE CV LAB;  Service: Cardiovascular;  Laterality: N/A;   CARPAL TUNNEL RELEASE Bilateral    CERVICAL DISC SURGERY     CHOLECYSTECTOMY     CORONARY ANGIOPLASTY  2002   EYE SURGERY  2020 January   Cataracts   HERNIA REPAIR     JOINT REPLACEMENT  2003   Knee   LUMBAR LAMINECTOMY     L3-4   NEPHRECTOMY Right    Partial   SPINE SURGERY     C1   TONSILLECTOMY     TOTAL KNEE ARTHROPLASTY Left    TUBAL LIGATION     ULNAR NERVE REPAIR Left     Prior to Admission medications   Medication Sig Start Date End Date Taking? Authorizing Provider  albuterol (PROAIR HFA) 108 (90 Base) MCG/ACT inhaler Inhale 2 puffs into the lungs every 6 (six) hours as needed for wheezing or shortness of breath. 09/15/22   [place], Buren Kos, MD  amLODipine (NORVASC) 10 MG tablet Take 1 tablet (10 mg total) by mouth daily. 05/05/22   Chilton Si, MD  aspirin EC 81 MG tablet Take 81 mg by mouth daily.    [provider]  Blood Glucose Monitoring Suppl DEVI 1 each by Does not apply route in the morning, at noon, and at bedtime. May substitute to any manufacturer covered by patient's insurance. Patient states they use One Touch Verio Reflect. 09/16/22   de [place], Buren Kos, MD  carvedilol (COREG) 6.25 MG tablet Take 1 tablet (6.25 mg total) by mouth 2 (two) times daily with a meal. 05/05/22   Chilton Si, MD  Cholecalciferol (VITAMIN D) 50 MCG (2000 UT) tablet Take 1 tablet (2,000 Units total) by mouth daily. 12/29/22   de [place], Raymond J, MD  clopidogrel (PLAVIX) 75 MG tablet Take 1 tablet (75 mg total) by mouth daily. 05/05/22   Chilton Si, MD  Continuous Glucose Sensor (FREESTYLE LIBRE 3 SENSOR) MISC 1 Units by Does not apply route every 14 (fourteen)  days. 12/12/22   [place], Buren Kos, MD  COVID-19 mRNA vaccine, Pfizer, (COMIRNATY) syringe Inject into the muscle. 04/29/23   Judyann Munson, MD  dapagliflozin propanediol (FARXIGA) 10 MG TABS tablet Take 1 tablet (10 mg total) by mouth daily. 08/19/22   [place], Buren Kos, MD  ezetimibe-simvastatin (VYTORIN) 10-80 MG tablet Take 1 tablet by mouth at bedtime. 10/28/22   [place], Buren Kos, MD  furosemide (LASIX) 20 MG tablet Take 1-2 tablets (20-40 mg total) by mouth daily as needed for fluid or edema. 03/18/23   [place], Buren Kos, MD  glucose blood (FREESTYLE LITE) test strip Use to test blood sugar up to four times daily 09/15/22  HYDROcodone-acetaminophen (NORCO) 10-325 MG tablet Take 1 tablet by mouth daily as needed. 03/04/23   [place], Buren Kos, MD  Lancets (FREESTYLE) lancets Use to test up to 4 (four) times daily. 09/15/22     Multiple Vitamin (MULTIVITAMIN) tablet Take 1 tablet by mouth daily.    [provider]  nitroGLYCERIN (NITROLINGUAL) 0.4 MG/SPRAY spray Place 1 spray under the tongue every five minutes for 3 doses as needed for chest pain. 03/18/23   [place], Buren Kos, MD  [facility] VERIO test strip USE AS DIRECTED IN THE MORNING, AT NOON, AND AT BEDTIME 01/06/23   [place], Buren Kos, MD  pantoprazole (PROTONIX) 40 MG tablet Take 1 tablet (40 mg total) by mouth 2 (two) times daily before a meal. 10/28/22   [place], Buren Kos, MD  PEG 3350-KCl-NaCl-NaSulf-MgSul (SUFLAVE) 178.7 g SOLR Use as directed for 1 dose 03/24/23   Tyja Gortney, Durene Romans, MD  potassium chloride (KLOR-CON M) 10 MEQ tablet TAKE ONE TABLET BY MOUTH WHEN YOU TAKE YOUR FUROSEMIDE FOR SWELLING. 03/18/23   [place], Buren Kos, MD  Semaglutide,0.25 or 0.5MG /DOS, (OZEMPIC, 0.25 OR 0.5 MG/DOSE,) 2 MG/3ML SOPN Inject 0.5 mg into the skin once a week. 02/12/23   [place], Buren Kos, MD  valsartan (DIOVAN) 320 MG tablet Take 1 tablet (320 mg total) by mouth daily. 04/24/23   Alver Sorrow, NP    Current Outpatient Medications   Medication Sig Dispense Refill   albuterol (PROAIR HFA) 108 (90 Base) MCG/ACT inhaler Inhale 2 puffs into the lungs every 6 (six) hours as needed for wheezing or shortness of breath. 18 g 3   amLODipine (NORVASC) 10 MG tablet Take 1 tablet (10 mg total) by mouth daily. 90 tablet 2   aspirin EC 81 MG tablet Take 81 mg by mouth daily.     carvedilol (COREG) 6.25 MG tablet Take 1 tablet (6.25 mg total) by mouth 2 (two) times daily with a meal. 180 tablet 2   Cholecalciferol (VITAMIN D) 50 MCG (2000 UT) tablet Take 1 tablet (2,000 Units total) by mouth daily. 90 tablet 1   dapagliflozin propanediol (FARXIGA) 10 MG TABS tablet Take 1 tablet (10 mg total) by mouth daily. 90 tablet 3   ezetimibe-simvastatin (VYTORIN) 10-80 MG tablet Take 1 tablet by mouth at bedtime. 90 tablet 3   pantoprazole (PROTONIX) 40 MG tablet Take 1 tablet (40 mg total) by mouth 2 (two) times daily before a meal. 60 tablet 11   valsartan (DIOVAN) 320 MG tablet Take 1 tablet (320 mg total) by mouth daily. 90 tablet 0   Blood Glucose Monitoring Suppl DEVI 1 each by Does not apply route in the morning, at noon, and at bedtime. May substitute to any manufacturer covered by patient's insurance. Patient states they use One Touch Verio Reflect. 1 each 0   clopidogrel (PLAVIX) 75 MG tablet Take 1 tablet (75 mg total) by mouth daily. 90 tablet 2   Continuous Glucose Sensor (FREESTYLE LIBRE 3 SENSOR) MISC 1 Units by Does not apply route every 14 (fourteen) days. 5 each 11   COVID-19 mRNA vaccine, Pfizer, (COMIRNATY) syringe Inject into the muscle. 0.3 mL 0   furosemide (LASIX) 20 MG tablet Take 1-2 tablets (20-40 mg total) by mouth daily as needed for fluid or edema. 30 tablet 0   glucose blood (FREESTYLE LITE) test strip Use to test blood sugar up to four times daily 350 each 11   HYDROcodone-acetaminophen (NORCO) 10-325 MG tablet Take 1 tablet  by mouth daily as needed. 30 tablet 0   Lancets (FREESTYLE) lancets Use to test up to 4 (four)  times daily. 300 each 5   Multiple Vitamin (MULTIVITAMIN) tablet Take 1 tablet by mouth daily.     nitroGLYCERIN (NITROLINGUAL) 0.4 MG/SPRAY spray Place 1 spray under the tongue every five minutes for 3 doses as needed for chest pain. 4.9 g 2   ONETOUCH VERIO test strip USE AS DIRECTED IN THE MORNING, AT NOON, AND AT BEDTIME 100 strip 0   potassium chloride (KLOR-CON M) 10 MEQ tablet TAKE ONE TABLET BY MOUTH WHEN YOU TAKE YOUR FUROSEMIDE FOR SWELLING. 90 tablet 0   Semaglutide,0.25 or 0.5MG /DOS, (OZEMPIC, 0.25 OR 0.5 MG/DOSE,) 2 MG/3ML SOPN Inject 0.5 mg into the skin once a week. 3 mL 1   Current Facility-Administered Medications  Medication Dose Route Frequency Provider Last Rate Last Admin   0.9 %  sodium chloride infusion  500 mL Intravenous Continuous Trudee Chirino, Durene Romans, MD        Allergies as of 05/08/2023 - Review Complete 05/08/2023  Allergen Reaction Noted   Sulfa antibiotics Diarrhea and Nausea And Vomiting 10/01/2015    Family History  Problem Relation Age of Onset   Colitis Mother        sepsis from c dif colitis   Melanoma Mother    COPD Mother    Diabetes Mother    Heart disease Mother    Hyperlipidemia Mother    Hypertension Mother    Heart disease Father    Early death Father    Diabetes Sister    Early death Sister    Learning disabilities Sister    Stomach cancer Brother 50   Stomach cancer Maternal Aunt    Vision loss Maternal Aunt    Lung cancer Maternal Uncle    Cancer Maternal Uncle    Prostate cancer Maternal Uncle        metastatic   Lung cancer Maternal Uncle    Lung cancer Maternal Grandmother    Stomach cancer Paternal Grandmother    Hearing loss Paternal Grandfather    Hyperlipidemia Paternal Grandfather    Hypertension Paternal Grandfather    Stroke Paternal Grandfather    Learning disabilities Daughter    Breast cancer Cousin    Colon cancer Neg Hx    Esophageal cancer Neg Hx    Rectal cancer Neg Hx     Social History   Socioeconomic  History   Marital status: Widowed    Spouse name: Not on file   Number of children: Not on file   Years of education: Not on file   Highest education level: Associate degree: occupational, Scientist, product/process development, or vocational program  Occupational History   Occupation: retired    Associate Professor: UNEMPLOYED  Tobacco Use   Smoking status: Former    Current packs/day: 0.00    Average packs/day: 2.0 packs/day for 41.0 years (82.0 ttl pk-yrs)    Types: Cigarettes    Start date: 08/04/1974    Quit date: 08/04/2015    Years since quitting: 7.7    Passive exposure: Past   Smokeless tobacco: Never   Tobacco comments:    Quit 2000 started again 2008, quit again 2011, started again 2014  Vaping Use   Vaping status: Never Used  Substance and Sexual Activity   Alcohol use: No   Drug use: No   Sexual activity: Not Currently    Birth control/protection: Post-menopausal  Other Topics Concern   Not on file  Social  History Narrative   Widowed in 04/2012. 2 children. 3 grandkids. Lives alone. Completely indendent. Disabled/retired. Disabled-lifted computer paper. Hobbies: spend money-gamble.         Are you right handed or left handed? Right Handed    Are you currently employed ? No    What is your current occupation?   Do you live at home alone? Yes   Who lives with you?    What type of home do you live in: 1 story or 2 story? Lives in a one story home        Social Drivers of Health   Financial Resource Strain: Medium Risk (03/02/2023)   Overall Financial Resource Strain (CARDIA)    Difficulty of Paying Living Expenses: Somewhat hard  Food Insecurity: Food Insecurity Present (03/02/2023)   Hunger Vital Sign    Worried About Running Out of Food in the Last Year: Sometimes true    Ran Out of Food in the Last Year: Never true  Transportation Needs: No Transportation Needs (03/02/2023)   PRAPARE - Administrator, Civil Service (Medical): No    Lack of Transportation (Non-Medical): No  Physical  Activity: Inactive (03/02/2023)   Exercise Vital Sign    Days of Exercise per Week: 0 days    Minutes of Exercise per Session: 0 min  Stress: No Stress Concern Present (03/02/2023)   [employer] of Occupational Health - Occupational Stress Questionnaire    Feeling of Stress : Not at all  Social Connections: Socially Isolated (03/02/2023)   Social Connection and Isolation Panel [NHANES]    Frequency of Communication with Friends and Family: More than three times a week    Frequency of Social Gatherings with Friends and Family: Once a week    Attends Religious Services: Never    Database administrator or Organizations: No    Attends Banker Meetings: Never    Marital Status: Widowed  Intimate Partner Violence: Not At Risk (01/13/2023)   Humiliation, Afraid, Rape, and Kick questionnaire    Fear of Current or Ex-Partner: No    Emotionally Abused: No    Physically Abused: No    Sexually Abused: No    Review of Systems:  All other review of systems negative except as mentioned in the HPI.  Physical Exam: Vital signs BP (!) 166/75   Pulse 76   Temp 97.6 F (36.4 C)   Resp 18   Ht 5\' 1"  (1.549 m)   Wt 218 lb (98.9 kg)   LMP  (LMP Unknown)   SpO2 99%   BMI 41.19 kg/m   General:   Alert,  Well-developed, well-nourished, pleasant and cooperative in NAD Airway:  Mallampati 2 Lungs:  Clear throughout to auscultation.   Heart:  Regular rate and rhythm; no murmurs, clicks, rubs,  or gallops. Abdomen:  Soft, nontender and nondistended. Normal bowel sounds.   Neuro/Psych:  Normal mood and affect. A and O x 3  Maren Beach, MD [facility]

## 2023-05-08 ENCOUNTER — Ambulatory Visit: Payer: HMO | Admitting: Pediatrics

## 2023-05-08 ENCOUNTER — Encounter: Payer: Self-pay | Admitting: Pediatrics

## 2023-05-08 VITALS — BP 143/75 | HR 73 | Temp 97.6°F | Resp 20 | Ht 61.0 in | Wt 218.0 lb

## 2023-05-08 DIAGNOSIS — I10 Essential (primary) hypertension: Secondary | ICD-10-CM | POA: Diagnosis not present

## 2023-05-08 DIAGNOSIS — D122 Benign neoplasm of ascending colon: Secondary | ICD-10-CM | POA: Diagnosis not present

## 2023-05-08 DIAGNOSIS — Z860101 Personal history of adenomatous and serrated colon polyps: Secondary | ICD-10-CM

## 2023-05-08 DIAGNOSIS — Z1211 Encounter for screening for malignant neoplasm of colon: Secondary | ICD-10-CM | POA: Diagnosis not present

## 2023-05-08 DIAGNOSIS — I2510 Atherosclerotic heart disease of native coronary artery without angina pectoris: Secondary | ICD-10-CM | POA: Diagnosis not present

## 2023-05-08 DIAGNOSIS — D123 Benign neoplasm of transverse colon: Secondary | ICD-10-CM | POA: Diagnosis not present

## 2023-05-08 DIAGNOSIS — D126 Benign neoplasm of colon, unspecified: Secondary | ICD-10-CM

## 2023-05-08 DIAGNOSIS — J449 Chronic obstructive pulmonary disease, unspecified: Secondary | ICD-10-CM | POA: Diagnosis not present

## 2023-05-08 DIAGNOSIS — D124 Benign neoplasm of descending colon: Secondary | ICD-10-CM

## 2023-05-08 DIAGNOSIS — K219 Gastro-esophageal reflux disease without esophagitis: Secondary | ICD-10-CM | POA: Diagnosis not present

## 2023-05-08 DIAGNOSIS — F419 Anxiety disorder, unspecified: Secondary | ICD-10-CM | POA: Diagnosis not present

## 2023-05-08 MED ORDER — SODIUM CHLORIDE 0.9 % IV SOLN: 500.0000 mL | INTRAVENOUS | Status: DC

## 2023-05-08 NOTE — Op Note (Signed)
 [facility] Patient Name: Tricia [name] Procedure Date: 05/08/2023 9:00 AM MRN: [id number] Endoscopist: Maren Beach , MD, [id number] Age: 78 Referring MD:  Date of Birth: April 21, 1945 Gender: Female Account #: [id number] Procedure:                Upper GI endoscopy Indications:              Follow-up of gastro-esophageal reflux disease,                            worsening symptoms despite PPI, LPR, history of                            gastric bypass Medicines:                Monitored Anesthesia Care Procedure:                Pre-Anesthesia Assessment:                           - Prior to the procedure, a History and Physical                            was performed, and patient medications and                            allergies were reviewed. The patient's tolerance of                            previous anesthesia was also reviewed. The risks                            and benefits of the procedure and the sedation                            options and risks were discussed with the patient.                            All questions were answered, and informed consent                            was obtained. Prior Anticoagulants: The patient has                            taken Plavix (clopidogrel), last dose was 5 days                            prior to procedure. ASA Grade Assessment: III - A                            patient with severe systemic disease. After                            reviewing the risks and benefits, the patient was  deemed in satisfactory condition to undergo the                            procedure.                           After obtaining informed consent, the endoscope was                            passed under direct vision. Throughout the                            procedure, the patient's blood pressure, pulse, and                            oxygen saturations were monitored continuously. The                             GIF [id number] #[id number] was introduced through the                            mouth, and advanced to the jejunum. The upper GI                            endoscopy was accomplished without difficulty. The                            patient tolerated the procedure well. Scope In: Scope Out: Findings:                 The examined esophagus was normal.                           The gastric pouch appeared normal in size. Normal                            mucosa. Normal retroflexion. Healthy appearing                            gastrojejunal anastomosis.                           The examined jejunum was normal. Both jejunal limbs                            were evaluated and were normal. Complications:            No immediate complications. Estimated blood loss:                            None. Estimated Blood Loss:     Estimated blood loss: none. Impression:               - Normal esophagus.                           - Normal-appearing gastric pouch. Healthy  gastrojejunal anastomosis.                           - Normal examined jejunum.                           - No specimens collected. Recommendation:           - Perform a colonoscopy today.                           - Continue present medications. Will discuss adding                            famotidine 40 mg p.o. nightly                           - Continue GERD lifestyle modifications                           - The findings and recommendations were discussed                            with the patient's family. Maren Beach, MD 05/08/2023 9:50:33 AM This report has been signed electronically.

## 2023-05-08 NOTE — Patient Instructions (Addendum)
 Educational handout provided to patient related to Hemorrhoids & Polyps  Resume previous diet  Continue present medications-Resume PLAVIX at prior dose on Sunday 05/10/2023 per Dr. Doy Hutching  Awaiting pathology results   YOU HAD AN ENDOSCOPIC PROCEDURE TODAY AT THE [facility]:   Refer to the procedure report that was given to you for any specific questions about what was found during the examination.  If the procedure report does not answer your questions, please call your gastroenterologist to clarify.  If you requested that your care partner not be given the details of your procedure findings, then the procedure report has been included in a sealed envelope for you to review at your convenience later.  YOU SHOULD EXPECT: Some feelings of bloating in the abdomen. Passage of more gas than usual.  Walking can help get rid of the air that was put into your GI tract during the procedure and reduce the bloating. If you had a lower endoscopy (such as a colonoscopy or flexible sigmoidoscopy) you may notice spotting of blood in your stool or on the toilet paper. If you underwent a bowel prep for your procedure, you may not have a normal bowel movement for a few days.  Please Note:  You might notice some irritation and congestion in your nose or some drainage.  This is from the oxygen used during your procedure.  There is no need for concern and it should clear up in a day or so.  SYMPTOMS TO REPORT IMMEDIATELY:  Following lower endoscopy (colonoscopy or flexible sigmoidoscopy):  Excessive amounts of blood in the stool  Significant tenderness or worsening of abdominal pains  Swelling of the abdomen that is new, acute  Fever of 100F or higher  Following upper endoscopy (EGD)  Vomiting of blood or coffee ground material  New chest pain or pain under the shoulder blades  Painful or persistently difficult swallowing  New shortness of breath  Fever of 100F or higher  Black,  tarry-looking stools  For urgent or emergent issues, a gastroenterologist can be reached at any hour by calling [phone]. Do not use MyChart messaging for urgent concerns.    DIET:  We do recommend a small meal at first, but then you may proceed to your regular diet.  Drink plenty of fluids but you should avoid alcoholic beverages for 24 hours.  ACTIVITY:  You should plan to take it easy for the rest of today and you should NOT DRIVE or use heavy machinery until tomorrow (because of the sedation medicines used during the test).    FOLLOW UP: Our staff will call the number listed on your records the next business day following your procedure.  We will call around 7:15- 8:00 am to check on you and address any questions or concerns that you may have regarding the information given to you following your procedure. If we do not reach you, we will leave a message.     If any biopsies were taken you will be contacted by phone or by letter within the next 1-3 weeks.  Please call us at [phone] if you have not heard about the biopsies in 3 weeks.    SIGNATURES/CONFIDENTIALITY: You and/or your care partner have signed paperwork which will be entered into your electronic medical record.  These signatures attest to the fact that that the information above on your After Visit Summary has been reviewed and is understood.  Full responsibility of the confidentiality of this discharge information lies with you  and/or your care-partner.

## 2023-05-08 NOTE — Progress Notes (Signed)
 Report to PACU, RN, vss, BBS= Clear.

## 2023-05-08 NOTE — Op Note (Addendum)
 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 The patient's tolerance of                            previous anesthesia was also reviewed. The risks                            and benefits of the procedure and the sedation                            options and risks were discussed with the patient.                            All questions were answered, and informed consent                            was obtained. Prior Anticoagulants: The patient has                            taken Plavix (clopidogrel), last dose was 5 days  prior to procedure. ASA Grade Assessment: III - A                            patient with severe systemic disease. After                            reviewing the risks and benefits, the patient was                            deemed in satisfactory condition to undergo the                            procedure.                           After obtaining informed consent, the colonoscope                             was passed under direct vision. Throughout the                            procedure, the patient's blood pressure, pulse, and                            oxygen saturations were monitored continuously. The                            CF [id number] #[id number] was introduced through the anus                            and advanced to the cecum, identified by                            appendiceal orifice and ileocecal valve. The                            colonoscopy was performed without difficulty. The                            patient tolerated the procedure well. The quality                            of the bowel preparation was good. The ileocecal                            valve, appendiceal orifice, and rectum were                            photographed. Scope In: 9:14:51 AM Scope Out: 9:38:38 AM Scope Withdrawal Time: 0 hours 17 minutes 13 seconds  Total Procedure Duration: 0 hours 23 minutes 47 seconds  Findings:                 The perianal and digital rectal examinations were  normal. Pertinent negatives include normal                            sphincter tone and no palpable rectal lesions.                           Multiple small-mouthed diverticula were found in                            the sigmoid colon.                           Four sessile polyps were found in the descending                            colon and ascending colon. The polyps were 4 to 6                            mm in size. These polyps were removed with a cold                            snare. Resection and retrieval were complete.                           Two sessile polyps were found in the transverse                            colon and ascending colon. The polyps were 2 to 3                            mm in size. These polyps were removed with a cold                            biopsy forceps. Resection and retrieval were                            complete.                            Internal hemorrhoids were found during retroflexion. Complications:            No immediate complications. Estimated blood loss:                            Minimal. Estimated Blood Loss:     Estimated blood loss was minimal. Impression:               - Diverticulosis in the sigmoid colon.                           - Four 4 to 6 mm polyps in the descending colon and                            in the ascending colon, removed with a cold snare.  Resected and retrieved.                           - Two 2 to 3 mm polyps in the transverse colon and                            in the ascending colon, removed with a cold biopsy                            forceps. Resected and retrieved.                           - Internal hemorrhoids. Recommendation:           - Discharge patient to home (ambulatory).                           - Await pathology results.                           - Repeat colonoscopy for surveillance based on                            pathology results.                           - Resume Plavix (clopidogrel) at prior dose.                           - The findings and recommendations were discussed                            with the patient's family.                           - Return to GI clinic as previously scheduled.                           - Patient has a contact number available for                            emergencies. The signs and symptoms of potential                            delayed complications were discussed with the                            patient. Return to normal activities tomorrow.                            Written discharge instructions were provided to the                            patient. Maren Beach, MD 05/08/2023 9:46:10 AM This report has been signed electronically. Addendum Number: 1   Addendum Date: 05/08/2023 9:51:25 AM  Resume Plavix on 05/10/2023 Maren Beach, MD 05/08/2023 9:51:43 AM This  report has been signed electronically.

## 2023-05-08 NOTE — Progress Notes (Signed)
 Called to room to assist during endoscopic procedure.  Patient ID and intended procedure confirmed with present staff. Received instructions for my participation in the procedure from the performing physician.

## 2023-05-11 ENCOUNTER — Telehealth: Payer: Self-pay

## 2023-05-11 NOTE — Telephone Encounter (Signed)
 Attempted to reach patient for post-procedure f/u call. No answer. Left message for her to please not hesitate to call if she has any questions/concerns regarding her call.

## 2023-05-12 ENCOUNTER — Encounter: Payer: Self-pay | Admitting: Pediatrics

## 2023-05-12 ENCOUNTER — Other Ambulatory Visit (HOSPITAL_BASED_OUTPATIENT_CLINIC_OR_DEPARTMENT_OTHER): Payer: Self-pay | Admitting: Cardiovascular Disease

## 2023-05-12 ENCOUNTER — Other Ambulatory Visit: Payer: Self-pay

## 2023-05-12 ENCOUNTER — Other Ambulatory Visit (HOSPITAL_COMMUNITY): Payer: Self-pay

## 2023-05-12 DIAGNOSIS — I25118 Atherosclerotic heart disease of native coronary artery with other forms of angina pectoris: Secondary | ICD-10-CM

## 2023-05-12 LAB — SURGICAL PATHOLOGY

## 2023-05-12 MED ORDER — CARVEDILOL 6.25 MG PO TABS
6.2500 mg | ORAL_TABLET | Freq: Two times a day (BID) | ORAL | 3 refills | Status: AC
  Filled 2023-05-12: qty 60, 30d supply, fill #0
  Filled 2023-05-26 – 2023-06-09 (×2): qty 60, 30d supply, fill #1
  Filled 2023-06-26 – 2023-07-28 (×2): qty 60, 30d supply, fill #2
  Filled 2023-08-20 – 2023-08-25 (×2): qty 60, 30d supply, fill #3
  Filled 2023-09-18 – 2023-09-22 (×2): qty 60, 30d supply, fill #4
  Filled 2023-10-22: qty 60, 30d supply, fill #5
  Filled 2023-11-26: qty 60, 30d supply, fill #6
  Filled 2023-12-22: qty 60, 30d supply, fill #7
  Filled 2024-01-25: qty 60, 30d supply, fill #8
  Filled 2024-02-26: qty 60, 30d supply, fill #9

## 2023-05-12 MED ORDER — AMLODIPINE BESYLATE 10 MG PO TABS
10.0000 mg | ORAL_TABLET | Freq: Every day | ORAL | 3 refills | Status: AC
  Filled 2023-05-12: qty 30, 30d supply, fill #0
  Filled 2023-05-26 – 2023-06-09 (×2): qty 30, 30d supply, fill #1
  Filled 2023-06-26 – 2023-07-28 (×2): qty 30, 30d supply, fill #2
  Filled 2023-08-20 – 2023-08-25 (×2): qty 30, 30d supply, fill #3
  Filled 2023-09-18 – 2023-09-22 (×2): qty 30, 30d supply, fill #4
  Filled 2023-10-22: qty 30, 30d supply, fill #5
  Filled 2023-11-26: qty 30, 30d supply, fill #6
  Filled 2023-12-22: qty 30, 30d supply, fill #7
  Filled 2024-01-25: qty 30, 30d supply, fill #8
  Filled 2024-02-26: qty 30, 30d supply, fill #9

## 2023-05-12 MED ORDER — CLOPIDOGREL BISULFATE 75 MG PO TABS
75.0000 mg | ORAL_TABLET | Freq: Every day | ORAL | 3 refills | Status: AC
  Filled 2023-05-12: qty 30, 30d supply, fill #0
  Filled 2023-05-26 – 2023-06-09 (×2): qty 30, 30d supply, fill #1
  Filled 2023-06-26 – 2023-07-28 (×2): qty 30, 30d supply, fill #2
  Filled 2023-08-20 – 2023-08-25 (×2): qty 30, 30d supply, fill #3
  Filled 2023-09-18 – 2023-09-22 (×2): qty 30, 30d supply, fill #4
  Filled 2023-10-22: qty 30, 30d supply, fill #5
  Filled 2023-11-26: qty 30, 30d supply, fill #6
  Filled 2023-12-22: qty 30, 30d supply, fill #7
  Filled 2024-02-26: qty 30, 30d supply, fill #9
  Filled ????-??-??: fill #8

## 2023-05-13 ENCOUNTER — Other Ambulatory Visit (HOSPITAL_COMMUNITY): Payer: Self-pay

## 2023-05-13 ENCOUNTER — Other Ambulatory Visit: Payer: Self-pay

## 2023-05-15 ENCOUNTER — Other Ambulatory Visit: Payer: Self-pay

## 2023-05-18 ENCOUNTER — Other Ambulatory Visit: Payer: Self-pay

## 2023-05-18 ENCOUNTER — Other Ambulatory Visit (HOSPITAL_COMMUNITY): Payer: Self-pay

## 2023-05-19 ENCOUNTER — Other Ambulatory Visit (HOSPITAL_COMMUNITY): Payer: Self-pay

## 2023-05-19 ENCOUNTER — Other Ambulatory Visit: Payer: Self-pay

## 2023-05-20 ENCOUNTER — Encounter (HOSPITAL_BASED_OUTPATIENT_CLINIC_OR_DEPARTMENT_OTHER): Payer: Self-pay | Admitting: Family Medicine

## 2023-05-21 ENCOUNTER — Ambulatory Visit (HOSPITAL_BASED_OUTPATIENT_CLINIC_OR_DEPARTMENT_OTHER): Admitting: Family Medicine

## 2023-05-21 ENCOUNTER — Encounter (HOSPITAL_BASED_OUTPATIENT_CLINIC_OR_DEPARTMENT_OTHER): Payer: Self-pay | Admitting: Family Medicine

## 2023-05-21 VITALS — BP 128/69 | HR 78 | Ht 62.0 in | Wt 221.9 lb

## 2023-05-21 DIAGNOSIS — K219 Gastro-esophageal reflux disease without esophagitis: Secondary | ICD-10-CM

## 2023-05-21 DIAGNOSIS — M150 Primary generalized (osteo)arthritis: Secondary | ICD-10-CM | POA: Diagnosis not present

## 2023-05-21 DIAGNOSIS — M79661 Pain in right lower leg: Secondary | ICD-10-CM

## 2023-05-21 MED ORDER — HYDROCODONE-ACETAMINOPHEN 10-325 MG PO TABS: 1.0000 | ORAL_TABLET | Freq: Every day | ORAL | 0 refills | Status: DC | PRN

## 2023-05-21 NOTE — Assessment & Plan Note (Addendum)
 Has had new issue of right calf pain which started a few days ago.  Due to location she does have some concern about DVT.  She has not had any recent immobility such as surgery or travel.  She does continue with aspirin  and Plavix  as prescribed.  She does not necessarily recall any specific injury.  Pain is noted when standing or with walking.  She indicates that she has no pain at rest when either sitting or lying.  Has mild bilateral lower extremity swelling, does not feel that she has increased swelling on right as compared to left.  She denies any change in breathing, no new shortness of breath. On exam, patient is in no acute distress, vital signs stable.  Bilateral lower extremities with about 1+ pitting edema equally, no increased swelling of right lower extremity as compared to left.  She does have mild tenderness to palpation through posterior aspect of calf, more so towards distal third of calf muscle.  No palpable cord. Discussed differential with patient.  DVT would be within differential, however given her history and exam I feel that this is less likely to be underlying cause of symptoms.  More likely to be related to muscular etiology.  Discussed consideration for ultrasound to rule out DVT, however patient declines at this time.  Can manage conservatively.  Discussed general recommendations.  Can utilize ice or heat as preferred.  Recommend gentle stretching.  Activities as tolerated.

## 2023-05-21 NOTE — Patient Instructions (Signed)
   Medication Instructions:  Your physician recommends that you continue on your current medications as directed. Please refer to the Current Medication list given to you today. --If you need a refill on any your medications before your next appointment, please call your pharmacy first. If no refills are authorized on file call the office.--   Follow-Up: Your next appointment:   Your physician recommends that you schedule a follow-up appointment in: 2-3 month follow up  with Dr. de Peru  You will receive a text message or e-mail with a link to a survey about your care and experience with [place] today! We would greatly appreciate your feedback!   Thanks for letting us be apart of your health journey!!  Primary Care and Sports Medicine   Dr. Ceasar Mons Peru   We encourage you to activate your patient portal called "MyChart".  Sign up information is provided on this After Visit Summary.  MyChart is used to connect with patients for Virtual Visits (Telemedicine).  Patients are able to view lab/test results, encounter notes, upcoming appointments, etc.  Non-urgent messages can be sent to your provider as well. To learn more about what you can do with MyChart, please visit --  [email or web address].

## 2023-05-21 NOTE — Assessment & Plan Note (Signed)
 Patient has been having recent issues with reflux.  She notes that she has had problems with episodes of vomiting up food that she recently ate.  She has noticed that she will eat and they will feel as though food is sitting heavy on her stomach and then she ultimately will throw it back up.  This has been present for about 4 to 5 days.  She has not had any associated pain with this.  She did have recent evaluation with GI with endoscopy and colonoscopy completed earlier this month.  She reports that findings from this were normal other than polyps noted on colonoscopy.  She does continue with semaglutide which she generally has been doing well with and has been on current dose of 1 mg for a while now without issue. We discussed considerations.  I do feel it is less likely to be related to her GLP-1 receptor agonist given that she has been doing well with this previously.  She did have normal endoscopy recently as well.  Discussed that it is possibly related to something functional in which case, GI could consider alternative testing for further evaluation.  Given that this is relatively new for patient, would be reasonable to continue with monitoring.  Could also consider adjusting dose of semaglutide or holding medication entirely.  For now, we will continue with monitoring and she will update us  on any changes.

## 2023-05-21 NOTE — Telephone Encounter (Signed)
 Called reading room. There was an IT ticket placed on CT chest. He is getting this fixed and getting it sent back over to the reading room.   I will advise pt

## 2023-05-21 NOTE — Progress Notes (Signed)
 Procedures performed today:    None.  Independent interpretation of notes and tests performed by another provider:   None.  Brief History, Exam, Impression, and Recommendations:    BP 128/69 (BP Location: Right Arm, Patient Position: Sitting, Cuff Size: Large)   Pulse 78   Ht 5\' 2"  (1.575 m)   Wt 221 lb 14.4 oz (100.7 kg)   LMP  (LMP Unknown)   SpO2 95%   BMI 40.59 kg/m   Right calf pain Assessment & Plan: Has had new issue of right calf pain which started a few days ago.  Due to location she does have some concern about DVT.  She has not had any recent immobility such as surgery or travel.  She does continue with aspirin  and Plavix  as prescribed.  She does not necessarily recall any specific injury.  Pain is noted when standing or with walking.  She indicates that she has no pain at rest when either sitting or lying.  Has mild bilateral lower extremity swelling, does not feel that she has increased swelling on right as compared to left.  She denies any change in breathing, no new shortness of breath. On exam, patient is in no acute distress, vital signs stable.  Bilateral lower extremities with about 1+ pitting edema equally, no increased swelling of right lower extremity as compared to left.  She does have mild tenderness to palpation through posterior aspect of calf, more so towards distal third of calf muscle.  No palpable cord. Discussed differential with patient.  DVT would be within differential, however given her history and exam I feel that this is less likely to be underlying cause of symptoms.  More likely to be related to muscular etiology.  Discussed consideration for ultrasound to rule out DVT, however patient declines at this time.  Can manage conservatively.  Discussed general recommendations.  Can utilize ice or heat as preferred.  Recommend gentle stretching.  Activities as tolerated.   Primary osteoarthritis involving multiple joints -      HYDROcodone -Acetaminophen ; Take 1 tablet by mouth daily as needed.  Dispense: 30 tablet; Refill: 0  Gastroesophageal reflux disease without esophagitis Assessment & Plan: Patient has been having recent issues with reflux.  She notes that she has had problems with episodes of vomiting up food that she recently ate.  She has noticed that she will eat and they will feel as though food is sitting heavy on her stomach and then she ultimately will throw it back up.  This has been present for about 4 to 5 days.  She has not had any associated pain with this.  She did have recent evaluation with GI with endoscopy and colonoscopy completed earlier this month.  She reports that findings from this were normal other than polyps noted on colonoscopy.  She does continue with semaglutide  which she generally has been doing well with and has been on current dose of 1 mg for a while now without issue. We discussed considerations.  I do feel it is less likely to be related to her GLP-1 receptor agonist given that she has been doing well with this previously.  She did have normal endoscopy recently as well.  Discussed that it is possibly related to something functional in which case, GI could consider alternative testing for further evaluation.  Given that this is relatively new for patient, would be reasonable to continue with monitoring.  Could also consider adjusting dose of semaglutide  or holding medication entirely.  For now,  we will continue with monitoring and she will update us  on any changes.   Return in about 2 months (around 07/21/2023) for diabetes.   ___________________________________________ Tricia Bonifield de [place], MD, ABFM, CAQSM Primary Care and Sports Medicine [facility]

## 2023-05-26 ENCOUNTER — Other Ambulatory Visit: Payer: Self-pay

## 2023-06-01 ENCOUNTER — Other Ambulatory Visit: Payer: Self-pay

## 2023-06-01 DIAGNOSIS — Z122 Encounter for screening for malignant neoplasm of respiratory organs: Secondary | ICD-10-CM

## 2023-06-01 DIAGNOSIS — Z87891 Personal history of nicotine dependence: Secondary | ICD-10-CM

## 2023-06-01 NOTE — Progress Notes (Signed)
 Error

## 2023-06-02 ENCOUNTER — Ambulatory Visit (HOSPITAL_BASED_OUTPATIENT_CLINIC_OR_DEPARTMENT_OTHER): Payer: PPO | Admitting: Family Medicine

## 2023-06-09 ENCOUNTER — Other Ambulatory Visit: Payer: Self-pay

## 2023-06-10 ENCOUNTER — Other Ambulatory Visit: Payer: Self-pay

## 2023-06-11 ENCOUNTER — Other Ambulatory Visit (HOSPITAL_BASED_OUTPATIENT_CLINIC_OR_DEPARTMENT_OTHER): Payer: Self-pay | Admitting: *Deleted

## 2023-06-11 ENCOUNTER — Encounter (HOSPITAL_BASED_OUTPATIENT_CLINIC_OR_DEPARTMENT_OTHER): Payer: Self-pay | Admitting: Family Medicine

## 2023-06-11 ENCOUNTER — Other Ambulatory Visit: Payer: Self-pay

## 2023-06-11 MED ORDER — FREESTYLE LIBRE 3 READER DEVI: 1.0000 | Freq: Once | 0 refills | Status: AC

## 2023-06-11 MED ORDER — FREESTYLE LIBRE 3 PLUS SENSOR MISC: 1 refills | Status: DC

## 2023-06-12 ENCOUNTER — Other Ambulatory Visit: Payer: Self-pay

## 2023-06-12 ENCOUNTER — Other Ambulatory Visit (HOSPITAL_BASED_OUTPATIENT_CLINIC_OR_DEPARTMENT_OTHER): Payer: Self-pay | Admitting: *Deleted

## 2023-06-12 DIAGNOSIS — E1169 Type 2 diabetes mellitus with other specified complication: Secondary | ICD-10-CM

## 2023-06-12 DIAGNOSIS — E1121 Type 2 diabetes mellitus with diabetic nephropathy: Secondary | ICD-10-CM

## 2023-06-12 DIAGNOSIS — N1831 Chronic kidney disease, stage 3a: Secondary | ICD-10-CM

## 2023-06-12 MED ORDER — DAPAGLIFLOZIN PROPANEDIOL 10 MG PO TABS: 10.0000 mg | ORAL_TABLET | Freq: Every day | ORAL | 3 refills | Status: DC

## 2023-06-16 ENCOUNTER — Other Ambulatory Visit: Payer: Self-pay

## 2023-06-19 ENCOUNTER — Other Ambulatory Visit (HOSPITAL_BASED_OUTPATIENT_CLINIC_OR_DEPARTMENT_OTHER): Payer: Self-pay | Admitting: Family Medicine

## 2023-06-22 ENCOUNTER — Other Ambulatory Visit (HOSPITAL_BASED_OUTPATIENT_CLINIC_OR_DEPARTMENT_OTHER): Payer: Self-pay | Admitting: *Deleted

## 2023-06-22 MED ORDER — FREESTYLE LIBRE 3 PLUS SENSOR MISC: 1 refills | Status: DC

## 2023-06-22 NOTE — Telephone Encounter (Signed)
 Freestyle libra plus sent to pharmacy

## 2023-06-26 ENCOUNTER — Other Ambulatory Visit: Payer: Self-pay

## 2023-07-02 ENCOUNTER — Ambulatory Visit (HOSPITAL_BASED_OUTPATIENT_CLINIC_OR_DEPARTMENT_OTHER): Admitting: Student

## 2023-07-06 ENCOUNTER — Other Ambulatory Visit: Payer: Self-pay

## 2023-07-21 ENCOUNTER — Encounter (HOSPITAL_BASED_OUTPATIENT_CLINIC_OR_DEPARTMENT_OTHER): Payer: Self-pay | Admitting: Family Medicine

## 2023-07-21 ENCOUNTER — Ambulatory Visit (HOSPITAL_BASED_OUTPATIENT_CLINIC_OR_DEPARTMENT_OTHER): Admitting: Family Medicine

## 2023-07-21 VITALS — BP 103/57 | HR 70 | Ht 61.5 in | Wt 207.0 lb

## 2023-07-21 DIAGNOSIS — M816 Localized osteoporosis [Lequesne]: Secondary | ICD-10-CM | POA: Diagnosis not present

## 2023-07-21 DIAGNOSIS — M48061 Spinal stenosis, lumbar region without neurogenic claudication: Secondary | ICD-10-CM | POA: Diagnosis not present

## 2023-07-21 DIAGNOSIS — D1801 Hemangioma of skin and subcutaneous tissue: Secondary | ICD-10-CM | POA: Diagnosis not present

## 2023-07-21 DIAGNOSIS — N1831 Chronic kidney disease, stage 3a: Secondary | ICD-10-CM

## 2023-07-21 DIAGNOSIS — L821 Other seborrheic keratosis: Secondary | ICD-10-CM | POA: Diagnosis not present

## 2023-07-21 DIAGNOSIS — D229 Melanocytic nevi, unspecified: Secondary | ICD-10-CM | POA: Diagnosis not present

## 2023-07-21 DIAGNOSIS — Z7984 Long term (current) use of oral hypoglycemic drugs: Secondary | ICD-10-CM | POA: Diagnosis not present

## 2023-07-21 DIAGNOSIS — E1122 Type 2 diabetes mellitus with diabetic chronic kidney disease: Secondary | ICD-10-CM | POA: Diagnosis not present

## 2023-07-21 DIAGNOSIS — L814 Other melanin hyperpigmentation: Secondary | ICD-10-CM | POA: Diagnosis not present

## 2023-07-21 DIAGNOSIS — L578 Other skin changes due to chronic exposure to nonionizing radiation: Secondary | ICD-10-CM | POA: Diagnosis not present

## 2023-07-21 LAB — POCT GLYCOSYLATED HEMOGLOBIN (HGB A1C)
HbA1c POC (<> result, manual entry): 5.7 % (ref 4.0–5.6)
HbA1c, POC (controlled diabetic range): 5.7 % (ref 0.0–7.0)
HbA1c, POC (prediabetic range): 5.7 % (ref 5.7–6.4)
Hemoglobin A1C: 5.7 % — AB (ref 4.0–5.6)

## 2023-07-21 MED ORDER — OZEMPIC (2 MG/DOSE) 8 MG/3ML ~~LOC~~ SOPN: 2.0000 mg | PEN_INJECTOR | SUBCUTANEOUS | Status: AC

## 2023-07-21 NOTE — Progress Notes (Signed)
    Procedures performed today:    None.  Independent interpretation of notes and tests performed by another provider:   None.  Brief History, Exam, Impression, and Recommendations:    BP (!) 103/57 (BP Location: Right Arm, Patient Position: Sitting, Cuff Size: Large)   Pulse 70   Ht 5' 1.5 (1.562 m)   Wt 207 lb (93.9 kg)   LMP  (LMP Unknown)   SpO2 97%   BMI 38.48 kg/m   Type 2 diabetes mellitus with stage 3a chronic kidney disease, without long-term current use of insulin  (HCC) Assessment & Plan: Patient continues with Ozempic , has been administering 1 mg dose for a few months now.  Has been tolerating medication well.  Denies any significant GI symptoms.  She also continues with Farxiga .  Prior hemoglobin A1c was at goal at 5.9 %.  She is due for hemoglobin A1c checked today We discussed options, she would like to proceed with dose titration of Ozempic  given that she has been tolerating medication well We will proceed with hemoglobin A1c check today - continues to be well-controlled at 5.7% Complete foot exam at next visit  Orders: -     POCT glycosylated hemoglobin (Hb A1C)  Spinal stenosis of lumbar region, unspecified whether neurogenic claudication present Assessment & Plan: Has had prior evaluation with neurology and did proceed with physical therapy which she felt to be very beneficial.  Unfortunately, she has had return of low back pain and would like to proceed with physical therapy again if possible.  She reports that symptoms are similar to prior symptoms before physical therapy.  She denies any new or concerning symptoms. Can proceed with referral at this time  Orders: -     Ambulatory referral to Physical Therapy  Localized osteoporosis without current pathological fracture Assessment & Plan: Noted on prior bone density testing with previous PCP.  It does appear that treatment options were briefly discussed at that time, however ultimately no specific  medication was started.  She has some questions today regarding management.  We discussed general considerations and she does have upcoming bone density testing to be scheduled.  We reviewed considerations pending results of bone density testing.  If it continues to be with a osteoporosis range, we will likely proceed with referral to endocrinology or to osteoporosis clinic for orthopedics.   Other orders -     Ozempic  (2 MG/DOSE); Inject 2 mg into the skin once a week.  Return in about 4 months (around 11/20/2023) for diabetes.   ___________________________________________ Mccabe Gloria de [place], MD, ABFM, CAQSM Primary Care and Sports Medicine [facility]

## 2023-07-21 NOTE — Assessment & Plan Note (Signed)
 Has had prior evaluation with neurology and did proceed with physical therapy which she felt to be very beneficial.  Unfortunately, she has had return of low back pain and would like to proceed with physical therapy again if possible.  She reports that symptoms are similar to prior symptoms before physical therapy.  She denies any new or concerning symptoms. Can proceed with referral at this time

## 2023-07-21 NOTE — Patient Instructions (Signed)

## 2023-07-21 NOTE — Assessment & Plan Note (Signed)
 Patient continues with Ozempic , has been administering 1 mg dose for a few months now.  Has been tolerating medication well.  Denies any significant GI symptoms.  She also continues with Farxiga .  Prior hemoglobin A1c was at goal at 5.9 %.  She is due for hemoglobin A1c checked today We discussed options, she would like to proceed with dose titration of Ozempic  given that she has been tolerating medication well We will proceed with hemoglobin A1c check today - continues to be well-controlled at 5.7% Complete foot exam at next visit

## 2023-07-21 NOTE — Assessment & Plan Note (Signed)
 Noted on prior bone density testing with previous PCP.  It does appear that treatment options were briefly discussed at that time, however ultimately no specific medication was started.  She has some questions today regarding management.  We discussed general considerations and she does have upcoming bone density testing to be scheduled.  We reviewed considerations pending results of bone density testing.  If it continues to be with a osteoporosis range, we will likely proceed with referral to endocrinology or to osteoporosis clinic for orthopedics.

## 2023-07-27 ENCOUNTER — Encounter (HOSPITAL_BASED_OUTPATIENT_CLINIC_OR_DEPARTMENT_OTHER): Payer: Self-pay | Admitting: Family Medicine

## 2023-07-28 ENCOUNTER — Other Ambulatory Visit: Payer: Self-pay

## 2023-07-29 ENCOUNTER — Other Ambulatory Visit (HOSPITAL_COMMUNITY): Payer: Self-pay

## 2023-07-29 ENCOUNTER — Other Ambulatory Visit: Payer: Self-pay

## 2023-07-30 ENCOUNTER — Other Ambulatory Visit: Payer: Self-pay

## 2023-08-04 ENCOUNTER — Other Ambulatory Visit (HOSPITAL_BASED_OUTPATIENT_CLINIC_OR_DEPARTMENT_OTHER): Payer: Self-pay | Admitting: Family Medicine

## 2023-08-04 DIAGNOSIS — Z1231 Encounter for screening mammogram for malignant neoplasm of breast: Secondary | ICD-10-CM

## 2023-08-11 ENCOUNTER — Encounter (HOSPITAL_BASED_OUTPATIENT_CLINIC_OR_DEPARTMENT_OTHER): Payer: Self-pay | Admitting: Family Medicine

## 2023-08-11 DIAGNOSIS — M150 Primary generalized (osteo)arthritis: Secondary | ICD-10-CM

## 2023-08-14 ENCOUNTER — Other Ambulatory Visit (HOSPITAL_BASED_OUTPATIENT_CLINIC_OR_DEPARTMENT_OTHER): Payer: Self-pay | Admitting: Family Medicine

## 2023-08-14 MED ORDER — HYDROCODONE-ACETAMINOPHEN 10-325 MG PO TABS: 1.0000 | ORAL_TABLET | Freq: Every day | ORAL | 0 refills | Status: DC | PRN

## 2023-08-20 ENCOUNTER — Other Ambulatory Visit (HOSPITAL_BASED_OUTPATIENT_CLINIC_OR_DEPARTMENT_OTHER): Payer: Self-pay | Admitting: Family

## 2023-08-20 ENCOUNTER — Other Ambulatory Visit: Payer: Self-pay

## 2023-08-20 ENCOUNTER — Other Ambulatory Visit (HOSPITAL_COMMUNITY): Payer: Self-pay

## 2023-08-20 DIAGNOSIS — I152 Hypertension secondary to endocrine disorders: Secondary | ICD-10-CM

## 2023-08-20 MED ORDER — VALSARTAN 320 MG PO TABS
320.0000 mg | ORAL_TABLET | Freq: Every day | ORAL | 2 refills | Status: AC
  Filled 2023-08-20 – 2023-08-25 (×2): qty 30, 30d supply, fill #0
  Filled 2023-09-18 – 2023-09-22 (×2): qty 30, 30d supply, fill #1
  Filled 2023-10-22: qty 30, 30d supply, fill #2
  Filled 2023-11-26: qty 30, 30d supply, fill #3
  Filled 2023-12-22: qty 30, 30d supply, fill #4
  Filled 2024-01-25: qty 30, 30d supply, fill #5
  Filled 2024-02-26: qty 30, 30d supply, fill #6
  Filled 2024-03-01: qty 30, 30d supply, fill #0

## 2023-08-21 ENCOUNTER — Other Ambulatory Visit: Payer: Self-pay

## 2023-08-21 ENCOUNTER — Other Ambulatory Visit (HOSPITAL_COMMUNITY): Payer: Self-pay

## 2023-08-25 ENCOUNTER — Other Ambulatory Visit: Payer: Self-pay

## 2023-08-25 ENCOUNTER — Other Ambulatory Visit (HOSPITAL_COMMUNITY): Payer: Self-pay

## 2023-08-28 ENCOUNTER — Other Ambulatory Visit: Payer: Self-pay

## 2023-09-10 DIAGNOSIS — H43813 Vitreous degeneration, bilateral: Secondary | ICD-10-CM | POA: Diagnosis not present

## 2023-09-10 DIAGNOSIS — H01024 Squamous blepharitis left upper eyelid: Secondary | ICD-10-CM | POA: Diagnosis not present

## 2023-09-10 DIAGNOSIS — H01021 Squamous blepharitis right upper eyelid: Secondary | ICD-10-CM | POA: Diagnosis not present

## 2023-09-10 DIAGNOSIS — E119 Type 2 diabetes mellitus without complications: Secondary | ICD-10-CM | POA: Diagnosis not present

## 2023-09-10 DIAGNOSIS — Z961 Presence of intraocular lens: Secondary | ICD-10-CM | POA: Diagnosis not present

## 2023-09-15 ENCOUNTER — Ambulatory Visit (HOSPITAL_BASED_OUTPATIENT_CLINIC_OR_DEPARTMENT_OTHER): Attending: Family Medicine | Admitting: Physical Therapy

## 2023-09-15 ENCOUNTER — Other Ambulatory Visit: Payer: Self-pay

## 2023-09-15 ENCOUNTER — Encounter (HOSPITAL_BASED_OUTPATIENT_CLINIC_OR_DEPARTMENT_OTHER): Payer: Self-pay | Admitting: Physical Therapy

## 2023-09-15 DIAGNOSIS — M48061 Spinal stenosis, lumbar region without neurogenic claudication: Secondary | ICD-10-CM | POA: Diagnosis not present

## 2023-09-15 DIAGNOSIS — M546 Pain in thoracic spine: Secondary | ICD-10-CM | POA: Diagnosis not present

## 2023-09-15 DIAGNOSIS — M545 Low back pain, unspecified: Secondary | ICD-10-CM | POA: Diagnosis not present

## 2023-09-15 DIAGNOSIS — M6281 Muscle weakness (generalized): Secondary | ICD-10-CM | POA: Insufficient documentation

## 2023-09-15 DIAGNOSIS — R262 Difficulty in walking, not elsewhere classified: Secondary | ICD-10-CM | POA: Insufficient documentation

## 2023-09-15 NOTE — Therapy (Signed)
 OUTPATIENT PHYSICAL THERAPY THORACOLUMBAR EVALUATION   Patient Name: Tricia [name] MRN: [id number] DOB:07-Aug-1945, 78 y.o., female Today's Date: 09/15/2023  END OF SESSION:  PT End of Session - 09/15/23 1012     Visit Number 1    Number of Visits 13    Date for PT Re-Evaluation 12/14/23    Authorization Type HTA    PT Start Time 0930    PT Stop Time 1011    PT Time Calculation (min) 41 min    Activity Tolerance Patient limited by pain    Behavior During Therapy [facility] for tasks assessed/performed           Past Medical History:  Diagnosis Date   Adenomatous polyp of colon 02/2004   Allergy 2000   Sulfur   Anemia    Anxiety    Arthritis    B12 deficiency    CAD (coronary artery disease)    a. s/p remote BMS to LAD;  b. 09/2015 Inf STEMI/VF Arrest: LM nl, LAD 85p (staged PCI 2 days later w/ 3.0x32 Synergy DES), 40p/m, 25d, LCX 56m, RCA 80ost/100p (2.75x32 Synergy DES), 23m, EF 55-65%. // c. 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 [employer]'s 76 gene CancerNext-Expanded + RNAinsight panel otherwise normal. The CancerNext-Expanded gene panel offered by [employer] and includes sequencing, rearrangement, and RNA analysis for the following 76 genes: AIP, ALK, APC, ATM, AXIN2, BAP1, BARD1, BMPR1A, BRCA1, BRCA2, BRIP1, CDC73, CDH1, CDK4, CDKN1B, CDKN*   GERD (gastroesophageal reflux disease)    H/O echocardiogram    a. 09/2015 Echo: EF 60-65%, no rwma, mild AI, mildly dil RA, mild to mod TR.   History of echocardiogram    Echo 3/22: EF 60-65, no RWMA, GR 1  DD, normal RVSF, mild LAE, RVSP 35.5, mild to moderate TR, trivial AI   Hyperlipidemia    Hypertension    Hypertensive heart disease    Low back pain    l5 disc   Lumbar spinal stenosis 03/03/2019   Morbid obesity (HCC)    Myocardial infarction (HCC) 2001, 2017   Neuromuscular disorder (HCC)    Osteoporosis    PVC's (premature ventricular contractions) 04/16/2017   Holter 3/19: NSR, average heart rate 69, frequent PVCs (5% total beats), rare supraventricular ectopics, no AF/flutter   Sleep apnea 10/03/2009   Resolved after gastric bypass     TOBACCO ABUSE 10/05/2009   Qualifier: Diagnosis of  By: Pietro, MD, CODY Redell Dimes    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 In setting of inferior STEMI.   Past Surgical History:  Procedure Laterality Date   ABDOMINAL HYSTERECTOMY     APPENDECTOMY     BARIATRIC SURGERY     CARDIAC CATHETERIZATION N/A 10/01/2015   Procedure: Left Heart Cath and Coronary Angiography;  Surgeon: Alm LELON Clay, MD;  Location: [facility] INVASIVE CV LAB;  Service: Cardiovascular;  Laterality: N/A;   CARDIAC CATHETERIZATION N/A 10/01/2015   Procedure: Coronary Stent Intervention;  Surgeon: Alm LELON Clay, MD;  Location: [facility] INVASIVE CV LAB;  Service: Cardiovascular;  Laterality:  N/A;   CARDIAC CATHETERIZATION N/A 10/03/2015   Procedure: Coronary Stent Intervention;  Surgeon: Debby DELENA Sor, MD;  Location: [facility] INVASIVE CV LAB;  Service: Cardiovascular;  Laterality: N/A;   CARDIAC CATHETERIZATION N/A 10/03/2015   Procedure: Coronary/Graft Angiography;  Surgeon: Debby DELENA Sor, MD;  Location: [facility] INVASIVE CV LAB;  Service: Cardiovascular;  Laterality: N/A;   CARPAL TUNNEL RELEASE Bilateral    CERVICAL DISC SURGERY     CHOLECYSTECTOMY     CORONARY ANGIOPLASTY  2002   EYE SURGERY  2020 January   Cataracts   HERNIA REPAIR     JOINT REPLACEMENT  2003   Knee   LUMBAR LAMINECTOMY     L3-4   NEPHRECTOMY Right    Partial   SPINE SURGERY     C1    TONSILLECTOMY     TOTAL KNEE ARTHROPLASTY Left    TUBAL LIGATION     ULNAR NERVE REPAIR Left    Patient Active Problem List   Diagnosis Date Noted   Right calf pain 05/21/2023   Genetic testing 04/20/2023   History of malignant neoplasm 12/02/2022   Seborrheic dermatitis 12/02/2022   Lower extremity pain 09/01/2022   Dysuria 04/11/2022   Shortness of breath 06/10/2021   Chronic scapular pain 12/11/2020   Poor balance 09/12/2020   Diabetes mellitus (HCC) 08/22/2020   Chronic kidney disease, stage 3, mod decreased GFR (HCC) 03/09/2020   History of partial nephrectomy 09/08/2019   Laryngopharyngeal reflux (LPR) 09/06/2019   Lumbar spinal stenosis 03/03/2019   Lower extremity edema 11/21/2018   PVC's (premature ventricular contractions) 04/16/2017   Hyperparathyroidism, secondary (HCC) 07/06/2016   COPD (chronic obstructive pulmonary disease) (HCC) 04/19/2016   Coronary artery disease involving native coronary artery of native heart without angina pectoris 03/10/2016   History of cardiac arrest 10/01/2015   History of ST elevation myocardial infarction (STEMI) 10/01/2015   GERD (gastroesophageal reflux disease), Rx Protonix  02/14/2014   Hx of gastric bypass 02/14/2014   Former smoker, 50 pack years, quit 2010 02/14/2014   Anemia, B12 deficiency 04/04/2009   Osteoporosis 12/14/2006   Hyperlipidemia associated with type 2 diabetes mellitus (HCC) 09/22/2006   Hypertension associated with diabetes (HCC) 09/22/2006   Osteoarthritis, multiple sites 09/22/2006   Degenerative joint disease (DJD) of lumbar spine 09/22/2006   Adenomatous polyp of colon 02/04/2004     REFERRING PROVIDER: de [place], Raymond J, MD   REFERRING DIAG:   [phone] (ICD-10-CM) - Spinal stenosis of lumbar region, unspecified whether neurogenic claudication present    Rationale for Evaluation and Treatment: Rehabilitation  THERAPY DIAG:  Pain, lumbar region  Muscle weakness (generalized)  Pain in thoracic  spine  Difficulty walking  ONSET DATE: Chronic back pain and leg weakness ; 3 months ago  SUBJECTIVE:  SUBJECTIVE STATEMENT:   Pt returns due to spasms returning in her back. Pt still occasionally uses a rollator for walking. Muscle spasms are in the upper and mid back. Pt was referred back again for aquatic therapy. Pt did not end up joining a [employer] or pool facility after last episode. Pt reports she used her son's pool to walk but has not sought another exercise option. Pt reports getting significant relief from last PT episode. Pt has less shooting pains than previously. Leans on a cart with grocery shopping. Pt is still limited with lifting- able to lift heavy but has pain after. Back spasms start after 15 mins of standing. She has history of lumbar canal stenosis at L4-5.  Pt reports shooting pains down leg in sitting, standing, or supine. Worst pain is with carrying/lifting objects including carrying groceries.  Pt is limited with standing duration which impairs her ability to cook. Limits her ability to do do her hair due to the back spasms. Pt does have a recumbent bike at home- seldomly uses. Pt reports sometimes she will had L foot pain with sudden onset of pain across the midfoot with WB. Pain will go away after about an hour with icing/rest/massaging. No cramping in the calf with her pain but does cause feeling of pulling all the way into the glute.   PERTINENT HISTORY:  spondylolisthesis at L4-5 Osteoporosis, COPD, HTN, CAD, CKD, arthritis, MI in 2001 and 2017 Cervical surgery including on C1, gastric bypass surgery, L TKR, partial nephrectomy, Bladder incontinence--wear pads  PAIN:  1/10 Current, 10/10 worst   Lumbar and midback It just hurts   PRECAUTIONS: Other: osteoporosis,  spondylolisthesis, L TKR, cervical surgery   WEIGHT BEARING RESTRICTIONS: No  FALLS:  Has patient fallen in last 6 months?1, getting up and down pool steps without railing   LIVING ENVIRONMENT: Lives with: lives alone, son and daughter in law come 3-4x/week  Lives in: 1 story home Has following equipment at home: rollator, quad cane   PLOF: Independent  PATIENT GOALS: stand for 1 hour, to be pain free    OBJECTIVE:   DIAGNOSTIC FINDINGS:  CT myelogram 02/11/2013: 1. Small central disc protrusion L2-3 without significant  compressive pathology.  2. Mild multifactorial spinal stenosis L3-4.  3. Advanced facet disease L4-5 allowing grade 1 anterolisthesis  without dynamic instability, and resulting in moderate central canal  stenosis.  4. Asymmetric facet degenerative change L5-S1, left worse than right.   Lumbar x ray in 2019: IMPRESSION: Stable spondylolisthesis at L4-5. No fracture. Lower lumbar scoliosis, stable. Mild disc space narrowing L4-5.   There is aortic atherosclerosis.  PATIENT SURVEYS:  Oswestry Score: 15 / 50 or 30 %   SCREENING FOR RED FLAGS: Bowel or bladder incontinence: Yes: bladder wear pads Spinal tumors: No Cauda equina syndrome: No Compression fracture: No Abdominal aneurysm: No  COGNITION: Overall cognitive status: Within functional limits for tasks assessed     SENSATION: Light touch impaired at L4 on R, L 5 on L    LUMBAR ROM:   Active  A/PROM  eval  Flexion 75%  Extension 50% p! R   Right lateral flexion 100%  Left lateral flexion 100%  Right rotation 50% R p!  Left rotation 50%     (Blank rows = not tested)    LOWER EXTREMITY MMT:    MMT Right eval Left eval  Hip flexion 26.6 18.3  Hip extension    Hip abduction 16.6 19.7  Hip adduction    Hip  internal rotation    Hip external rotation    Knee flexion    Knee extension 17.7 14.4  Ankle dorsiflexion    Ankle plantarflexion    Ankle inversion    Ankle eversion      (Blank rows = not tested)   Lumbar special test: positive slump  GAIT:  Comments: shuffling gait, Bilat toe out, lack of hip extension, Kyphotic posture  Stairs: bilat UE required with ascend and descend, lack control with descent   TUG: 14.1s  TODAY'S TREATMENT:                  Exercises - Seated Sciatic Nerve Glide  - 1 x daily - 7 x weekly - 2 sets - 10 reps - Seated Thoracic Lumbar Extension  - 1 x daily - 7 x weekly - 2 sets - 10 reps   PATIENT EDUCATION:  Education details: current evidence of exercise and back pain, walking program, diagnosis, prognosis, anatomy, exercise progression, DOMS expectations, muscle firing,  envelope of function, HEP, POC  Person educated: Patient Education method: Explanation Education comprehension: verbalized understanding and needs further education  HOME EXERCISE PROGRAM:                                                                                                                Access Code: [email or web address] URL: [email or web address] Date: 09/15/2023 Prepared by: Dale Call  ASSESSMENT:  CLINICAL IMPRESSION: Patient is a 78 y.o. female who was seen today for physical therapy evaluation and treatment for c/c of LBP, mid back pain, and LE pain. Pt's s/s appear consistent with lumbar radiculopathy, postural deficits, and generalized deconditioning. Pt's pain is highly sensitive and irritable with movement. Pt's is more pain dominant at this time. Pt advised on importance of exercise and compliance with HEP and carryover following PT, though pt appears reluctant to exercise. Plan to continue with aquatic exercise for short bout and then transition onto land at future sessions. Pt would benefit from continued skilled therapy in order to reach goals and maximize functional lumbopelvic strength and ROM for prevention of further functional decline.     OBJECTIVE IMPAIRMENTS: Abnormal gait, decreased activity tolerance, decreased  endurance, decreased mobility, difficulty walking, decreased strength, and pain.   ACTIVITY LIMITATIONS: carrying, lifting, sitting, standing, and locomotion level  PARTICIPATION LIMITATIONS: meal prep, cleaning, shopping, and community activity  PERSONAL FACTORS: Time since onset of injury/illness/exacerbation and 3+ comorbidities: Osteoporosis, COPD, CKD, arthritis, L TKR, cervical surgery, MI in 2001 and 2017 are also affecting patient's functional outcome.   REHAB POTENTIAL: Good  CLINICAL DECISION MAKING: Evolving/moderate complexity  EVALUATION COMPLEXITY: Moderate   GOALS:  SHORT TERM GOALS: Target date: 10/27/2023    Pt will be independent and compliant with HEP for improved pain, strength, tolerance to activity, and function.  Baseline: Goal status: INITIAL  2.Pt will report at least 2 pt reduction on NPRS scale for pain in order to demonstrate functional improvement with household activity, self care, and ADL.  Baseline:  Goal status: INITIAL  LONG TERM GOALS: Target date: 12/08/2023   Pt  will become independent with final HEP in order to demonstrate synthesis of PT education.  Baseline:  Goal status: INITIAL  2.  Pt will be able to demonstrate/report ability to sit/stand/sleep for extended periods of time without pain in order to demonstrate functional improvement and tolerance to static positioning.  Baseline:  Goal status: INITIAL  3.  Pt will be able to demonstrate/report ability to walk >10 mins less than 5/10 pain in order to demonstrate functional improvement and tolerance to exercise and community mobility.  Baseline:  Goal status: INITIAL  4. Pt will demonstrate at least a 12.8 improvement in Oswestry Index in order to demonstrate a clinically significant change in LBP and function.  Baseline:  Goal status: INITIAL     PLAN:  PT FREQUENCY: 1-2x/week  PT DURATION: 12 wks (plan to D/C in 8)  PLANNED INTERVENTIONS: Therapeutic exercises,  Therapeutic activity, Neuromuscular re-education, Balance training, Gait training, Patient/Family education, Self Care, Stair training, DME instructions, Aquatic Therapy, Dry Needling, Cryotherapy, Moist heat, Vasopneumatic device, Ultrasound, Manual therapy, and Re-evaluation.  PLAN FOR NEXT SESSION: core and LE strengthening.  Gait and balance training.  Aquatic for mobility and pain relief    Dale Call PT, DPT 09/15/23 11:34 AM

## 2023-09-18 ENCOUNTER — Other Ambulatory Visit (HOSPITAL_BASED_OUTPATIENT_CLINIC_OR_DEPARTMENT_OTHER): Payer: Self-pay | Admitting: Family Medicine

## 2023-09-18 ENCOUNTER — Other Ambulatory Visit: Payer: Self-pay

## 2023-09-18 DIAGNOSIS — N2581 Secondary hyperparathyroidism of renal origin: Secondary | ICD-10-CM

## 2023-09-21 ENCOUNTER — Other Ambulatory Visit: Payer: Self-pay

## 2023-09-21 ENCOUNTER — Other Ambulatory Visit (HOSPITAL_COMMUNITY): Payer: Self-pay

## 2023-09-21 MED ORDER — VITAMIN D 50 MCG (2000 UT) PO TABS
2000.0000 [IU] | ORAL_TABLET | Freq: Every day | ORAL | 1 refills | Status: AC
  Filled 2023-09-21: qty 30, 30d supply, fill #0
  Filled 2023-10-16 – 2023-10-22 (×2): qty 30, 30d supply, fill #1
  Filled 2023-11-26: qty 30, 30d supply, fill #2
  Filled 2023-12-22: qty 30, 30d supply, fill #3
  Filled 2024-01-25: qty 30, 30d supply, fill #4
  Filled 2024-02-26: qty 30, 30d supply, fill #5
  Filled ????-??-??: fill #4

## 2023-09-22 ENCOUNTER — Ambulatory Visit (HOSPITAL_BASED_OUTPATIENT_CLINIC_OR_DEPARTMENT_OTHER): Admitting: Physical Therapy

## 2023-09-22 ENCOUNTER — Encounter (HOSPITAL_BASED_OUTPATIENT_CLINIC_OR_DEPARTMENT_OTHER): Payer: Self-pay | Admitting: Physical Therapy

## 2023-09-22 ENCOUNTER — Other Ambulatory Visit (HOSPITAL_BASED_OUTPATIENT_CLINIC_OR_DEPARTMENT_OTHER): Payer: Self-pay | Admitting: Family Medicine

## 2023-09-22 ENCOUNTER — Other Ambulatory Visit: Payer: Self-pay

## 2023-09-22 DIAGNOSIS — R262 Difficulty in walking, not elsewhere classified: Secondary | ICD-10-CM

## 2023-09-22 DIAGNOSIS — M546 Pain in thoracic spine: Secondary | ICD-10-CM

## 2023-09-22 DIAGNOSIS — M6281 Muscle weakness (generalized): Secondary | ICD-10-CM

## 2023-09-22 DIAGNOSIS — M545 Low back pain, unspecified: Secondary | ICD-10-CM | POA: Diagnosis not present

## 2023-09-22 NOTE — Therapy (Signed)
 OUTPATIENT PHYSICAL THERAPY THORACOLUMBAR EVALUATION   Patient Name: Tricia [name] MRN: [id number] DOB:1945-12-01, 78 y.o., female Today's Date: 09/22/2023  END OF SESSION:  PT End of Session - 09/22/23 1158     Visit Number 2    Number of Visits 13    Date for PT Re-Evaluation 12/14/23    Authorization Type HTA    PT Start Time 1149    PT Stop Time 1230    PT Time Calculation (min) 41 min    Activity Tolerance Patient limited by pain    Behavior During Therapy [facility] for tasks assessed/performed            Past Medical History:  Diagnosis Date   Adenomatous polyp of colon 02/2004   Allergy 2000   Sulfur   Anemia    Anxiety    Arthritis    B12 deficiency    CAD (coronary artery disease)    a. s/p remote BMS to LAD;  b. 09/2015 Inf STEMI/VF Arrest: LM nl, LAD 85p (staged PCI 2 days later w/ 3.0x32 Synergy DES), 40p/m, 25d, LCX 14m, RCA 80ost/100p (2.75x32 Synergy DES), 6m, EF 55-65%. // c. 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 [employer]'s 76 gene CancerNext-Expanded + RNAinsight panel otherwise normal. The CancerNext-Expanded gene panel offered by [employer] and includes sequencing, rearrangement, and RNA analysis for the following 76 genes: AIP, ALK, APC, ATM, AXIN2, BAP1, BARD1, BMPR1A, BRCA1, BRCA2, BRIP1, CDC73, CDH1, CDK4, CDKN1B, CDKN*   GERD (gastroesophageal reflux disease)    H/O echocardiogram    a. 09/2015 Echo: EF 60-65%, no rwma, mild AI, mildly dil RA, mild to mod TR.   History of echocardiogram    Echo 3/22: EF 60-65, no RWMA, GR  1 DD, normal RVSF, mild LAE, RVSP 35.5, mild to moderate TR, trivial AI   Hyperlipidemia    Hypertension    Hypertensive heart disease    Low back pain    l5 disc   Lumbar spinal stenosis 03/03/2019   Morbid obesity (HCC)    Myocardial infarction (HCC) 2001, 2017   Neuromuscular disorder (HCC)    Osteoporosis    PVC's (premature ventricular contractions) 04/16/2017   Holter 3/19: NSR, average heart rate 69, frequent PVCs (5% total beats), rare supraventricular ectopics, no AF/flutter   Sleep apnea 10/03/2009   Resolved after gastric bypass     TOBACCO ABUSE 10/05/2009   Qualifier: Diagnosis of  By: Pietro, MD, CODY Redell Dimes    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 In setting of inferior STEMI.   Past Surgical History:  Procedure Laterality Date   ABDOMINAL HYSTERECTOMY     APPENDECTOMY     BARIATRIC SURGERY     CARDIAC CATHETERIZATION N/A 10/01/2015   Procedure: Left Heart Cath and Coronary Angiography;  Surgeon: Alm LELON Clay, MD;  Location: [facility] INVASIVE CV LAB;  Service: Cardiovascular;  Laterality: N/A;   CARDIAC CATHETERIZATION N/A 10/01/2015   Procedure: Coronary Stent Intervention;  Surgeon: Alm LELON Clay, MD;  Location: [facility] INVASIVE CV LAB;  Service: Cardiovascular;  Laterality: N/A;   CARDIAC CATHETERIZATION N/A 10/03/2015   Procedure: Coronary Stent Intervention;  Surgeon: Debby DELENA Sor, MD;  Location: [facility] INVASIVE CV LAB;  Service: Cardiovascular;  Laterality: N/A;   CARDIAC CATHETERIZATION N/A 10/03/2015   Procedure: Coronary/Graft Angiography;  Surgeon: Debby DELENA Sor, MD;  Location: [facility] INVASIVE CV LAB;  Service: Cardiovascular;  Laterality: N/A;   CARPAL TUNNEL RELEASE Bilateral    CERVICAL DISC SURGERY     CHOLECYSTECTOMY     CORONARY ANGIOPLASTY  2002   EYE SURGERY  2020 January   Cataracts   HERNIA REPAIR     JOINT REPLACEMENT  2003   Knee   LUMBAR LAMINECTOMY     L3-4   NEPHRECTOMY Right    Partial   SPINE SURGERY     C1    TONSILLECTOMY     TOTAL KNEE ARTHROPLASTY Left    TUBAL LIGATION     ULNAR NERVE REPAIR Left    Patient Active Problem List   Diagnosis Date Noted   Right calf pain 05/21/2023   Genetic testing 04/20/2023   History of malignant neoplasm 12/02/2022   Seborrheic dermatitis 12/02/2022   Lower extremity pain 09/01/2022   Dysuria 04/11/2022   Shortness of breath 06/10/2021   Chronic scapular pain 12/11/2020   Poor balance 09/12/2020   Diabetes mellitus (HCC) 08/22/2020   Chronic kidney disease, stage 3, mod decreased GFR (HCC) 03/09/2020   History of partial nephrectomy 09/08/2019   Laryngopharyngeal reflux (LPR) 09/06/2019   Lumbar spinal stenosis 03/03/2019   Lower extremity edema 11/21/2018   PVC's (premature ventricular contractions) 04/16/2017   Hyperparathyroidism, secondary (HCC) 07/06/2016   COPD (chronic obstructive pulmonary disease) (HCC) 04/19/2016   Coronary artery disease involving native coronary artery of native heart without angina pectoris 03/10/2016   History of cardiac arrest 10/01/2015   History of ST elevation myocardial infarction (STEMI) 10/01/2015   GERD (gastroesophageal reflux disease), Rx Protonix  02/14/2014   Hx of gastric bypass 02/14/2014   Former smoker, 50 pack years, quit 2010 02/14/2014   Anemia, B12 deficiency 04/04/2009   Osteoporosis 12/14/2006   Hyperlipidemia associated with type 2 diabetes mellitus (HCC) 09/22/2006   Hypertension associated with diabetes (HCC) 09/22/2006   Osteoarthritis, multiple sites 09/22/2006   Degenerative joint disease (DJD) of lumbar spine 09/22/2006   Adenomatous polyp of colon 02/04/2004     REFERRING PROVIDER: de [place], Raymond J, MD   REFERRING DIAG:   [phone] (ICD-10-CM) - Spinal stenosis of lumbar region, unspecified whether neurogenic claudication present    Rationale for Evaluation and Treatment: Rehabilitation  THERAPY DIAG:  Pain, lumbar region  Muscle weakness (generalized)  Pain in thoracic  spine  Difficulty walking  ONSET DATE: Chronic back pain and leg weakness ; 3 months ago  SUBJECTIVE:  SUBJECTIVE STATEMENT: No changes since eval. Pain 1/10   Initial Subjective Pt returns due to spasms returning in her back. Pt still occasionally uses a rollator for walking. Muscle spasms are in the upper and mid back. Pt was referred back again for aquatic therapy. Pt did not end up joining a [employer] or pool facility after last episode. Pt reports she used her son's pool to walk but has not sought another exercise option. Pt reports getting significant relief from last PT episode. Pt has less shooting pains than previously. Leans on a cart with grocery shopping. Pt is still limited with lifting- able to lift heavy but has pain after. Back spasms start after 15 mins of standing. She has history of lumbar canal stenosis at L4-5.  Pt reports shooting pains down leg in sitting, standing, or supine. Worst pain is with carrying/lifting objects including carrying groceries.  Pt is limited with standing duration which impairs her ability to cook. Limits her ability to do do her hair due to the back spasms. Pt does have a recumbent bike at home- seldomly uses. Pt reports sometimes she will had L foot pain with sudden onset of pain across the midfoot with WB. Pain will go away after about an hour with icing/rest/massaging. No cramping in the calf with her pain but does cause feeling of pulling all the way into the glute.   PERTINENT HISTORY:  spondylolisthesis at L4-5 Osteoporosis, COPD, HTN, CAD, CKD, arthritis, MI in 2001 and 2017 Cervical surgery including on C1, gastric bypass surgery, L TKR, partial nephrectomy, Bladder incontinence--wear pads  PAIN:  1/10 Current, 10/10 worst   Lumbar and midback It just  hurts   PRECAUTIONS: Other: osteoporosis, spondylolisthesis, L TKR, cervical surgery   WEIGHT BEARING RESTRICTIONS: No  FALLS:  Has patient fallen in last 6 months?1, getting up and down pool steps without railing   LIVING ENVIRONMENT: Lives with: lives alone, son and daughter in law come 3-4x/week  Lives in: 1 story home Has following equipment at home: rollator, quad cane   PLOF: Independent  PATIENT GOALS: stand for 1 hour, to be pain free    OBJECTIVE:   DIAGNOSTIC FINDINGS:  CT myelogram 02/11/2013: 1. Small central disc protrusion L2-3 without significant  compressive pathology.  2. Mild multifactorial spinal stenosis L3-4.  3. Advanced facet disease L4-5 allowing grade 1 anterolisthesis  without dynamic instability, and resulting in moderate central canal  stenosis.  4. Asymmetric facet degenerative change L5-S1, left worse than right.   Lumbar x ray in 2019: IMPRESSION: Stable spondylolisthesis at L4-5. No fracture. Lower lumbar scoliosis, stable. Mild disc space narrowing L4-5.   There is aortic atherosclerosis.  PATIENT SURVEYS:  Oswestry Score: 15 / 50 or 30 %   SCREENING FOR RED FLAGS: Bowel or bladder incontinence: Yes: bladder wear pads Spinal tumors: No Cauda equina syndrome: No Compression fracture: No Abdominal aneurysm: No  COGNITION: Overall cognitive status: Within functional limits for tasks assessed     SENSATION: Light touch impaired at L4 on R, L 5 on L    LUMBAR ROM:   Active  A/PROM  eval  Flexion 75%  Extension 50% p! R   Right lateral flexion 100%  Left lateral flexion 100%  Right rotation 50% R p!  Left rotation 50%     (Blank rows = not tested)    LOWER EXTREMITY MMT:    MMT Right eval Left eval  Hip flexion 26.6 18.3  Hip extension    Hip abduction 16.6  19.7  Hip adduction    Hip internal rotation    Hip external rotation    Knee flexion    Knee extension 17.7 14.4  Ankle dorsiflexion    Ankle  plantarflexion    Ankle inversion    Ankle eversion     (Blank rows = not tested)   Lumbar special test: positive slump  GAIT:  Comments: shuffling gait, Bilat toe out, lack of hip extension, Kyphotic posture  Stairs: bilat UE required with ascend and descend, lack control with descent   TUG: 14.1s  TODAY'S TREATMENT:                  [facility] Adult PT Treatment:                                                DATE: 09/22/23 Pt seen for aquatic therapy today.  Treatment took place in water 3.5-4.75 ft in depth at the [facility] pool. Temp of water was 91.  Pt entered/exited the pool via stairs using step to pattern with hand rail.  *re-Intro to setting *walking forward, back and side stepping in 3.6 ft with ue support of barbell *L stretch *HB carry rainbow forward and back x2 widths ea bilaterally then unilaterally *seated on lift: cycling; hip add/abd; LAQ *Ue support on wall: toe raises; heel raises (both increases sciatic pain); hip add/abd *Frequent rest periods given last half of session for pain reduciton *walking between exercises for recovery  Pt requires the buoyancy and hydrostatic pressure of water for support, and to offload joints by unweighting joint load by at least 50 % in navel deep water and by at least 75-80% in chest to neck deep water.  Viscosity of the water is needed for resistance of strengthening. Water current perturbations provides challenge to standing balance requiring increased core activation.      PATIENT EDUCATION:  Education details: current evidence of exercise and back pain, walking program, diagnosis, prognosis, anatomy, exercise progression, DOMS expectations, muscle firing,  envelope of function, HEP, POC  Person educated: Patient Education method: Explanation Education comprehension: verbalized understanding and needs further education  HOME EXERCISE PROGRAM:                                                                                                                 Access Code: [email or web address] URL: [email or web address] Date: 09/15/2023 Prepared by: Dale Call  ASSESSMENT:  CLINICAL IMPRESSION: Pt demonstrates safety and independence in aquatic setting with therapist instructing from deck. She is confident in setting, moving throughout 3.6-4.0 easily.  Pt is directed through various movement patterns and trials in both sitting and standing positions. She requires frequent rest periods throughout.  Pain does increase slightly with continuous walking and standing exercises initially (~ 15 mins) for which she is given more frequent rest periods last half of session. Regardless she reports increase in LBP end  of session 1/10 ->4/10. Plan to monitor response post session and adjust activity level as approp. She is a good candidate for aquatic intervention and will benefit from the properties of water to progress towards functional goals.      Initial Impression Patient is a 78 y.o. female who was seen today for physical therapy evaluation and treatment for c/c of LBP, mid back pain, and LE pain. Pt's s/s appear consistent with lumbar radiculopathy, postural deficits, and generalized deconditioning. Pt's pain is highly sensitive and irritable with movement. Pt's is more pain dominant at this time. Pt advised on importance of exercise and compliance with HEP and carryover following PT, though pt appears reluctant to exercise. Plan to continue with aquatic exercise for short bout and then transition onto land at future sessions. Pt would benefit from continued skilled therapy in order to reach goals and maximize functional lumbopelvic strength and ROM for prevention of further functional decline.     OBJECTIVE IMPAIRMENTS: Abnormal gait, decreased activity tolerance, decreased endurance, decreased mobility, difficulty walking, decreased strength, and pain.   ACTIVITY LIMITATIONS: carrying, lifting, sitting, standing,  and locomotion level  PARTICIPATION LIMITATIONS: meal prep, cleaning, shopping, and community activity  PERSONAL FACTORS: Time since onset of injury/illness/exacerbation and 3+ comorbidities: Osteoporosis, COPD, CKD, arthritis, L TKR, cervical surgery, MI in 2001 and 2017 are also affecting patient's functional outcome.   REHAB POTENTIAL: Good  CLINICAL DECISION MAKING: Evolving/moderate complexity  EVALUATION COMPLEXITY: Moderate   GOALS:  SHORT TERM GOALS: Target date: 10/27/2023    Pt will be independent and compliant with HEP for improved pain, strength, tolerance to activity, and function.  Baseline: Goal status: INITIAL  2.Pt will report at least 2 pt reduction on NPRS scale for pain in order to demonstrate functional improvement with household activity, self care, and ADL.  Baseline:  Goal status: INITIAL   LONG TERM GOALS: Target date: 12/08/2023   Pt  will become independent with final HEP in order to demonstrate synthesis of PT education.  Baseline:  Goal status: INITIAL  2.  Pt will be able to demonstrate/report ability to sit/stand/sleep for extended periods of time without pain in order to demonstrate functional improvement and tolerance to static positioning.  Baseline:  Goal status: INITIAL  3.  Pt will be able to demonstrate/report ability to walk >10 mins less than 5/10 pain in order to demonstrate functional improvement and tolerance to exercise and community mobility.  Baseline:  Goal status: INITIAL  4. Pt will demonstrate at least a 12.8 improvement in Oswestry Index in order to demonstrate a clinically significant change in LBP and function.  Baseline:  Goal status: INITIAL     PLAN:  PT FREQUENCY: 1-2x/week  PT DURATION: 12 wks (plan to D/C in 8)  PLANNED INTERVENTIONS: Therapeutic exercises, Therapeutic activity, Neuromuscular re-education, Balance training, Gait training, Patient/Family education, Self Care, Stair training, DME  instructions, Aquatic Therapy, Dry Needling, Cryotherapy, Moist heat, Vasopneumatic device, Ultrasound, Manual therapy, and Re-evaluation.  PLAN FOR NEXT SESSION: core and LE strengthening.  Gait and balance training.  Aquatic for mobility and pain relief    Ronal Foots) Dayelin Balducci MPT 09/22/23 12:24 PM [facility] [address] [place], [place], [address] Phone: [phone]   Fax:  [phone]

## 2023-09-23 ENCOUNTER — Other Ambulatory Visit: Payer: Self-pay

## 2023-09-25 ENCOUNTER — Other Ambulatory Visit: Payer: Self-pay

## 2023-09-28 ENCOUNTER — Ambulatory Visit (HOSPITAL_BASED_OUTPATIENT_CLINIC_OR_DEPARTMENT_OTHER): Payer: Self-pay | Admitting: Physical Therapy

## 2023-09-28 ENCOUNTER — Telehealth (HOSPITAL_BASED_OUTPATIENT_CLINIC_OR_DEPARTMENT_OTHER): Payer: Self-pay | Admitting: Family Medicine

## 2023-09-28 ENCOUNTER — Encounter (HOSPITAL_BASED_OUTPATIENT_CLINIC_OR_DEPARTMENT_OTHER): Payer: Self-pay | Admitting: Physical Therapy

## 2023-09-28 DIAGNOSIS — M546 Pain in thoracic spine: Secondary | ICD-10-CM

## 2023-09-28 DIAGNOSIS — M6281 Muscle weakness (generalized): Secondary | ICD-10-CM

## 2023-09-28 DIAGNOSIS — M545 Low back pain, unspecified: Secondary | ICD-10-CM | POA: Diagnosis not present

## 2023-09-28 NOTE — Telephone Encounter (Unsigned)
 Copied from CRM [id number]. Topic: Clinical - Medication Question >> Sep 25, 2023 12:01 PM DeAngela L wrote: Reason for CRM: patient will pick up on Monday  Semaglutide , 2 MG/DOSE, (OZEMPIC , 2 MG/DOSE,) 8 MG/3ML SOPN Pt num [phone] (H)

## 2023-09-28 NOTE — Telephone Encounter (Signed)
 Noted

## 2023-09-28 NOTE — Therapy (Signed)
 OUTPATIENT PHYSICAL THERAPY THORACOLUMBAR EVALUATION   Patient Name: Tricia [name] MRN: [id number] DOB:03-19-1945, 78 y.o., female Today's Date: 09/28/2023  END OF SESSION:  PT End of Session - 09/28/23 1018     Visit Number 3    Number of Visits 13    Date for PT Re-Evaluation 12/14/23    Authorization Type HTA    PT Start Time 1016    PT Stop Time 1055    PT Time Calculation (min) 39 min    Activity Tolerance Patient limited by pain    Behavior During Therapy [facility] for tasks assessed/performed            Past Medical History:  Diagnosis Date   Adenomatous polyp of colon 02/2004   Allergy 2000   Sulfur   Anemia    Anxiety    Arthritis    B12 deficiency    CAD (coronary artery disease)    a. s/p remote BMS to LAD;  b. 09/2015 Inf STEMI/VF Arrest: LM nl, LAD 85p (staged PCI 2 days later w/ 3.0x32 Synergy DES), 40p/m, 25d, LCX 41m, RCA 80ost/100p (2.75x32 Synergy DES), 55m, EF 55-65%. // c. 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 [employer]'s 76 gene CancerNext-Expanded + RNAinsight panel otherwise normal. The CancerNext-Expanded gene panel offered by [employer] and includes sequencing, rearrangement, and RNA analysis for the following 76 genes: AIP, ALK, APC, ATM, AXIN2, BAP1, BARD1, BMPR1A, BRCA1, BRCA2, BRIP1, CDC73, CDH1, CDK4, CDKN1B, CDKN*   GERD (gastroesophageal reflux disease)    H/O echocardiogram    a. 09/2015 Echo: EF 60-65%, no rwma, mild AI, mildly dil RA, mild to mod TR.   History of echocardiogram    Echo 3/22: EF 60-65, no RWMA, GR  1 DD, normal RVSF, mild LAE, RVSP 35.5, mild to moderate TR, trivial AI   Hyperlipidemia    Hypertension    Hypertensive heart disease    Low back pain    l5 disc   Lumbar spinal stenosis 03/03/2019   Morbid obesity (HCC)    Myocardial infarction (HCC) 2001, 2017   Neuromuscular disorder (HCC)    Osteoporosis    PVC's (premature ventricular contractions) 04/16/2017   Holter 3/19: NSR, average heart rate 69, frequent PVCs (5% total beats), rare supraventricular ectopics, no AF/flutter   Sleep apnea 10/03/2009   Resolved after gastric bypass     TOBACCO ABUSE 10/05/2009   Qualifier: Diagnosis of  By: Pietro, MD, CODY Redell Dimes    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 In setting of inferior STEMI.   Past Surgical History:  Procedure Laterality Date   ABDOMINAL HYSTERECTOMY     APPENDECTOMY     BARIATRIC SURGERY     CARDIAC CATHETERIZATION N/A 10/01/2015   Procedure: Left Heart Cath and Coronary Angiography;  Surgeon: Alm LELON Clay, MD;  Location: [facility] INVASIVE CV LAB;  Service: Cardiovascular;  Laterality: N/A;   CARDIAC CATHETERIZATION N/A 10/01/2015   Procedure: Coronary Stent Intervention;  Surgeon: Alm LELON Clay, MD;  Location: [facility] INVASIVE CV LAB;  Service: Cardiovascular;  Laterality: N/A;   CARDIAC CATHETERIZATION N/A 10/03/2015   Procedure: Coronary Stent Intervention;  Surgeon: Debby DELENA Sor, MD;  Location: [facility] INVASIVE CV LAB;  Service: Cardiovascular;  Laterality: N/A;   CARDIAC CATHETERIZATION N/A 10/03/2015   Procedure: Coronary/Graft Angiography;  Surgeon: Debby DELENA Sor, MD;  Location: [facility] INVASIVE CV LAB;  Service: Cardiovascular;  Laterality: N/A;   CARPAL TUNNEL RELEASE Bilateral    CERVICAL DISC SURGERY     CHOLECYSTECTOMY     CORONARY ANGIOPLASTY  2002   EYE SURGERY  2020 January   Cataracts   HERNIA REPAIR     JOINT REPLACEMENT  2003   Knee   LUMBAR LAMINECTOMY     L3-4   NEPHRECTOMY Right    Partial   SPINE SURGERY     C1    TONSILLECTOMY     TOTAL KNEE ARTHROPLASTY Left    TUBAL LIGATION     ULNAR NERVE REPAIR Left    Patient Active Problem List   Diagnosis Date Noted   Right calf pain 05/21/2023   Genetic testing 04/20/2023   History of malignant neoplasm 12/02/2022   Seborrheic dermatitis 12/02/2022   Lower extremity pain 09/01/2022   Dysuria 04/11/2022   Shortness of breath 06/10/2021   Chronic scapular pain 12/11/2020   Poor balance 09/12/2020   Diabetes mellitus (HCC) 08/22/2020   Chronic kidney disease, stage 3, mod decreased GFR (HCC) 03/09/2020   History of partial nephrectomy 09/08/2019   Laryngopharyngeal reflux (LPR) 09/06/2019   Lumbar spinal stenosis 03/03/2019   Lower extremity edema 11/21/2018   PVC's (premature ventricular contractions) 04/16/2017   Hyperparathyroidism, secondary (HCC) 07/06/2016   COPD (chronic obstructive pulmonary disease) (HCC) 04/19/2016   Coronary artery disease involving native coronary artery of native heart without angina pectoris 03/10/2016   History of cardiac arrest 10/01/2015   History of ST elevation myocardial infarction (STEMI) 10/01/2015   GERD (gastroesophageal reflux disease), Rx Protonix  02/14/2014   Hx of gastric bypass 02/14/2014   Former smoker, 50 pack years, quit 2010 02/14/2014   Anemia, B12 deficiency 04/04/2009   Osteoporosis 12/14/2006   Hyperlipidemia associated with type 2 diabetes mellitus (HCC) 09/22/2006   Hypertension associated with diabetes (HCC) 09/22/2006   Osteoarthritis, multiple sites 09/22/2006   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  Has happened twice.  Lasts for about 15 minutes then stops.   Initial Subjective Pt returns due to spasms returning in her back. Pt still occasionally uses a rollator for walking. Muscle spasms are in the upper and mid back. Pt was referred back again for aquatic therapy. Pt did not end up joining a [employer] or pool facility after last episode. Pt reports she used her son's pool to walk but has not sought another exercise option. Pt reports getting significant relief from last PT episode. Pt has less shooting pains than previously. Leans on a cart with grocery shopping. Pt is still limited with lifting- able to lift heavy but has pain after. Back spasms start after 15 mins of standing. She has history of lumbar canal stenosis at L4-5.  Pt reports shooting pains down leg in sitting, standing, or supine. Worst pain is with carrying/lifting objects including carrying groceries.  Pt is limited with standing duration which impairs her ability to cook. Limits her ability to do do her hair due to the back spasms. Pt does have a recumbent bike at home- seldomly uses. Pt reports sometimes she will had L foot pain with sudden onset of pain across the midfoot with WB. Pain will go away after about an hour with icing/rest/massaging. No cramping in the calf with her pain but does cause feeling of pulling all the way into the glute.   PERTINENT HISTORY:  spondylolisthesis at L4-5 Osteoporosis, COPD, HTN, CAD, CKD, arthritis, MI in 2001 and 2017 Cervical surgery including on C1, gastric bypass surgery, L TKR, partial nephrectomy, Bladder incontinence--wear pads  PAIN:  1/10  Current, 10/10 worst   Lumbar and midback It just hurts   PRECAUTIONS: Other: osteoporosis, spondylolisthesis, L TKR, cervical surgery   WEIGHT BEARING RESTRICTIONS: No  FALLS:  Has patient fallen in last 6 months?1, getting up and down pool steps without railing   LIVING ENVIRONMENT: Lives with: lives alone, son and daughter in law come 3-4x/week  Lives in: 1 story home Has following equipment at home: rollator, quad cane   PLOF: Independent  PATIENT GOALS: stand for 1 hour, to be pain free    OBJECTIVE:   DIAGNOSTIC FINDINGS:  CT myelogram 02/11/2013: 1. Small central disc protrusion L2-3 without significant  compressive pathology.  2. Mild multifactorial spinal stenosis L3-4.  3. Advanced facet disease L4-5 allowing grade 1 anterolisthesis  without dynamic instability, and resulting in moderate central canal  stenosis.  4. Asymmetric facet degenerative change L5-S1, left worse than right.   Lumbar x ray in 2019: IMPRESSION: Stable spondylolisthesis at L4-5. No fracture. Lower lumbar scoliosis, stable. Mild disc space narrowing L4-5.   There is aortic atherosclerosis.  PATIENT SURVEYS:  Oswestry Score: 15 / 50 or 30 %   SCREENING FOR RED FLAGS: Bowel or bladder incontinence: Yes: bladder wear pads Spinal tumors: No Cauda equina syndrome: No Compression fracture: No Abdominal aneurysm: No  COGNITION: Overall cognitive status: Within functional limits for tasks assessed     SENSATION: Light touch impaired at L4 on R, L 5 on L    LUMBAR ROM:   Active  A/PROM  eval  Flexion 75%  Extension 50% p! R   Right lateral flexion 100%  Left lateral flexion 100%  Right rotation 50% R p!  Left rotation 50%     (Blank rows = not tested)    LOWER EXTREMITY MMT:    MMT Right  eval Left eval  Hip flexion 26.6 18.3  Hip extension    Hip abduction 16.6 19.7  Hip adduction    Hip internal rotation    Hip external rotation    Knee flexion    Knee  extension 17.7 14.4  Ankle dorsiflexion    Ankle plantarflexion    Ankle inversion    Ankle eversion     (Blank rows = not tested)   Lumbar special test: positive slump  GAIT:  Comments: shuffling gait, Bilat toe out, lack of hip extension, Kyphotic posture  Stairs: bilat UE required with ascend and descend, lack control with descent   TUG: 14.1s  TODAY'S TREATMENT:                  [facility] Adult PT Treatment:                                                DATE: 09/28/23 Pt seen for aquatic therapy today.  Treatment took place in water 3.5-4.75 ft in depth at the [facility] pool. Temp of water was 91.  Pt entered/exited the pool via stairs using step to pattern with hand rail.  *re-Intro to setting *walking forward, back and side stepping in 3.6 ft with ue support of barbell *L stretch *seated on lift: cycling; hip add/abd; LAQ *Ue support on wall: toe raises; heel raises (no sciatic pain); hip add/abd; relaxed squats *HB carry rainbow forward and back x2 widths ea bilaterally then unilaterally *bow and arrow x 6 *1/2 noodle pull down for TrA engagement. VC and demonstration for execution. Wide stance then staggered stances x 8 reps. *walking between exercises for recovery  Pt requires the buoyancy and hydrostatic pressure of water for support, and to offload joints by unweighting joint load by at least 50 % in navel deep water and by at least 75-80% in chest to neck deep water.  Viscosity of the water is needed for resistance of strengthening. Water current perturbations provides challenge to standing balance requiring increased core activation.      PATIENT EDUCATION:  Education details: current evidence of exercise and back pain, walking program, diagnosis, prognosis, anatomy, exercise progression, DOMS expectations, muscle firing,  envelope of function, HEP, POC  Person educated: Patient Education method: Explanation Education comprehension: verbalized  understanding and needs further education  HOME EXERCISE PROGRAM:                                                                                                                Access Code: [email or web address] URL: [email or web address] Date: 09/15/2023 Prepared by: Dale Call  ASSESSMENT:  CLINICAL IMPRESSION: Good response to initial session . She has improved toleration initially to session today not limited by pain although right sided LB stiffness increases as session progresses. Instruction provided for core engagement throughout exercises..  She requires 2 seated rest periods. LBP 4/10 upon end of session.  Goals ongoing.      Initial Impression Patient is a 78 y.o. female who was seen today for physical therapy evaluation and treatment for c/c of LBP, mid back pain, and LE pain. Pt's s/s appear consistent with lumbar radiculopathy, postural deficits, and generalized deconditioning. Pt's pain is highly sensitive and irritable with movement. Pt's is more pain dominant at this time. Pt advised on importance of exercise and compliance with HEP and carryover following PT, though pt appears reluctant to exercise. Plan to continue with aquatic exercise for short bout and then transition onto land at future sessions. Pt would benefit from continued skilled therapy in order to reach goals and maximize functional lumbopelvic strength and ROM for prevention of further functional decline.     OBJECTIVE IMPAIRMENTS: Abnormal gait, decreased activity tolerance, decreased endurance, decreased mobility, difficulty walking, decreased strength, and pain.   ACTIVITY LIMITATIONS: carrying, lifting, sitting, standing, and locomotion level  PARTICIPATION LIMITATIONS: meal prep, cleaning, shopping, and community activity  PERSONAL FACTORS: Time since onset of injury/illness/exacerbation and 3+ comorbidities: Osteoporosis, COPD, CKD, arthritis, L TKR, cervical surgery, MI in 2001 and 2017 are also  affecting patient's functional outcome.   REHAB POTENTIAL: Good  CLINICAL DECISION MAKING: Evolving/moderate complexity  EVALUATION COMPLEXITY: Moderate   GOALS:  SHORT TERM GOALS: Target date: 10/27/2023    Pt will be independent and compliant with HEP for improved pain, strength, tolerance to activity, and function.  Baseline: Goal status: INITIAL  2.Pt will report at least 2 pt reduction on NPRS scale for pain in order to demonstrate functional improvement with household activity, self care, and ADL.  Baseline:  Goal status: INITIAL   LONG TERM GOALS: Target date: 12/08/2023   Pt  will become independent with final HEP in order to demonstrate synthesis of PT education.  Baseline:  Goal status: INITIAL  2.  Pt will be able to demonstrate/report ability to sit/stand/sleep for extended periods of time without pain in order to demonstrate functional improvement and tolerance to static positioning.  Baseline:  Goal status: INITIAL  3.  Pt will be able to demonstrate/report ability to walk >10 mins less than 5/10 pain in order to demonstrate functional improvement and tolerance to exercise and community mobility.  Baseline:  Goal status: INITIAL  4. Pt will demonstrate at least a 12.8 improvement in Oswestry Index in order to demonstrate a clinically significant change in LBP and function.  Baseline:  Goal status: INITIAL     PLAN:  PT FREQUENCY: 1-2x/week  PT DURATION: 12 wks (plan to D/C in 8)  PLANNED INTERVENTIONS: Therapeutic exercises, Therapeutic activity, Neuromuscular re-education, Balance training, Gait training, Patient/Family education, Self Care, Stair training, DME instructions, Aquatic Therapy, Dry Needling, Cryotherapy, Moist heat, Vasopneumatic device, Ultrasound, Manual therapy, and Re-evaluation.  PLAN FOR NEXT SESSION: core and LE strengthening.  Gait and balance training.  Aquatic for mobility and pain relief    Ronal Foots) Addisson Frate  MPT 09/28/23 11:47 AM [facility] [address] [place], [place], [address] Phone: [phone]   Fax:  [phone]

## 2023-10-01 ENCOUNTER — Encounter (HOSPITAL_BASED_OUTPATIENT_CLINIC_OR_DEPARTMENT_OTHER): Payer: Self-pay

## 2023-10-07 ENCOUNTER — Ambulatory Visit (HOSPITAL_BASED_OUTPATIENT_CLINIC_OR_DEPARTMENT_OTHER): Attending: Family Medicine | Admitting: Physical Therapy

## 2023-10-07 ENCOUNTER — Encounter (HOSPITAL_BASED_OUTPATIENT_CLINIC_OR_DEPARTMENT_OTHER): Payer: Self-pay | Admitting: Physical Therapy

## 2023-10-07 DIAGNOSIS — M6281 Muscle weakness (generalized): Secondary | ICD-10-CM | POA: Diagnosis not present

## 2023-10-07 DIAGNOSIS — M545 Low back pain, unspecified: Secondary | ICD-10-CM | POA: Insufficient documentation

## 2023-10-07 DIAGNOSIS — M546 Pain in thoracic spine: Secondary | ICD-10-CM | POA: Insufficient documentation

## 2023-10-07 DIAGNOSIS — R262 Difficulty in walking, not elsewhere classified: Secondary | ICD-10-CM | POA: Insufficient documentation

## 2023-10-07 NOTE — Therapy (Signed)
 OUTPATIENT PHYSICAL THERAPY THORACOLUMBAR EVALUATION   Patient Name: Tricia [name] MRN: [id number] DOB:[birth year], 78 y.o., female Today's Date: 10/07/2023  END OF SESSION:  PT End of Session - 10/07/23 1525     Visit Number 4    Number of Visits 13    Date for PT Re-Evaluation 12/14/23    Authorization Type HTA    PT Start Time 1458    PT Stop Time 1540    PT Time Calculation (min) 42 min    Activity Tolerance Patient limited by pain    Behavior During Therapy [facility] for tasks assessed/performed             Past Medical History:  Diagnosis Date   Adenomatous polyp of colon 02/2004   Allergy 2000   Sulfur   Anemia    Anxiety    Arthritis    B12 deficiency    CAD (coronary artery disease)    a. s/p remote BMS to LAD;  b. 09/2015 Inf STEMI/VF Arrest: LM nl, LAD 85p (staged PCI 2 days later w/ 3.0x32 Synergy DES), 40p/m, 25d, LCX 66m, RCA 80ost/100p (2.75x32 Synergy DES), 65m, EF 55-65%. // c. 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 [employer]'s 76 gene CancerNext-Expanded + RNAinsight panel otherwise normal. The CancerNext-Expanded gene panel offered by [employer] and includes sequencing, rearrangement, and RNA analysis for the following 76 genes: AIP, ALK, APC, ATM, AXIN2, BAP1, BARD1, BMPR1A, BRCA1, BRCA2, BRIP1, CDC73, CDH1, CDK4, CDKN1B, CDKN*   GERD (gastroesophageal reflux disease)    H/O echocardiogram    a. 09/2015 Echo: EF 60-65%, no rwma, mild AI, mildly dil RA, mild to mod TR.   History of echocardiogram    Echo 3/22: EF 60-65, no RWMA,  GR 1 DD, normal RVSF, mild LAE, RVSP 35.5, mild to moderate TR, trivial AI   Hyperlipidemia    Hypertension    Hypertensive heart disease    Low back pain    l5 disc   Lumbar spinal stenosis 03/03/2019   Morbid obesity (HCC)    Myocardial infarction (HCC) 2001, 2017   Neuromuscular disorder (HCC)    Osteoporosis    PVC's (premature ventricular contractions) 04/16/2017   Holter 3/19: NSR, average heart rate 69, frequent PVCs (5% total beats), rare supraventricular ectopics, no AF/flutter   Sleep apnea 10/03/2009   Resolved after gastric bypass     TOBACCO ABUSE 10/05/2009   Qualifier: Diagnosis of  By: Pietro, MD, CODY Redell Dimes    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 In setting of inferior STEMI.   Past Surgical History:  Procedure Laterality Date   ABDOMINAL HYSTERECTOMY     APPENDECTOMY     BARIATRIC SURGERY     CARDIAC CATHETERIZATION N/A 10/01/2015   Procedure: Left Heart Cath and Coronary Angiography;  Surgeon: Alm LELON Clay, MD;  Location: [place] INVASIVE CV LAB;  Service: Cardiovascular;  Laterality: N/A;   CARDIAC CATHETERIZATION N/A 10/01/2015   Procedure: Coronary Stent Intervention;  Surgeon: Alm LELON Clay, MD;  Location: [facility] INVASIVE CV LAB;  Service: Cardiovascular;  Laterality: N/A;   CARDIAC CATHETERIZATION N/A 10/03/2015   Procedure: Coronary Stent Intervention;  Surgeon: Debby DELENA Sor, MD;  Location: [facility] INVASIVE CV LAB;  Service: Cardiovascular;  Laterality: N/A;   CARDIAC CATHETERIZATION N/A 10/03/2015   Procedure: Coronary/Graft Angiography;  Surgeon: Debby DELENA Sor, MD;  Location: [facility] INVASIVE CV LAB;  Service: Cardiovascular;  Laterality: N/A;   CARPAL TUNNEL RELEASE Bilateral    CERVICAL DISC SURGERY     CHOLECYSTECTOMY     CORONARY ANGIOPLASTY  2002   EYE SURGERY  2020 January   Cataracts   HERNIA REPAIR     JOINT REPLACEMENT  2003   Knee   LUMBAR LAMINECTOMY     L3-4   NEPHRECTOMY Right    Partial   SPINE SURGERY     C1    TONSILLECTOMY     TOTAL KNEE ARTHROPLASTY Left    TUBAL LIGATION     ULNAR NERVE REPAIR Left    Patient Active Problem List   Diagnosis Date Noted   Right calf pain 05/21/2023   Genetic testing 04/20/2023   History of malignant neoplasm 12/02/2022   Seborrheic dermatitis 12/02/2022   Lower extremity pain 09/01/2022   Dysuria 04/11/2022   Shortness of breath 06/10/2021   Chronic scapular pain 12/11/2020   Poor balance 09/12/2020   Diabetes mellitus (HCC) 08/22/2020   Chronic kidney disease, stage 3, mod decreased GFR (HCC) 03/09/2020   History of partial nephrectomy 09/08/2019   Laryngopharyngeal reflux (LPR) 09/06/2019   Lumbar spinal stenosis 03/03/2019   Lower extremity edema 11/21/2018   PVC's (premature ventricular contractions) 04/16/2017   Hyperparathyroidism, secondary (HCC) 07/06/2016   COPD (chronic obstructive pulmonary disease) (HCC) 04/19/2016   Coronary artery disease involving native coronary artery of native heart without angina pectoris 03/10/2016   History of cardiac arrest 10/01/2015   History of ST elevation myocardial infarction (STEMI) 10/01/2015   GERD (gastroesophageal reflux disease), Rx Protonix  02/14/2014   Hx of gastric bypass 02/14/2014   Former smoker, 50 pack years, quit 2010 02/14/2014   Anemia, B12 deficiency 04/04/2009   Osteoporosis 12/14/2006   Hyperlipidemia associated with type 2 diabetes mellitus (HCC) 09/22/2006   Hypertension associated with diabetes (HCC) 09/22/2006   Osteoarthritis, multiple sites 09/22/2006   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 I had been feeling pretty good although I haven't needed to stand and cook. Walked in grocery store for 30 min pain earlier today 10/10.    Initial Subjective Pt returns due to spasms returning in her back. Pt still occasionally uses a rollator for walking. Muscle spasms are in the upper and mid back. Pt was referred back again for aquatic therapy. Pt did not end up joining a [employer] or pool facility after last episode. Pt reports she used her son's pool to walk but has not sought another exercise option. Pt reports getting significant relief from last PT episode. Pt has less shooting pains than previously. Leans on a cart with grocery shopping. Pt is still limited with lifting- able to lift heavy but has pain after. Back spasms start after 15 mins of standing. She has history of lumbar canal stenosis at L4-5.  Pt reports shooting pains down leg in sitting, standing, or supine. Worst pain is with carrying/lifting objects including carrying groceries.  Pt is limited with standing duration which impairs her ability to cook. Limits her ability to do do her hair due to the back spasms. Pt does have a recumbent bike at home- seldomly uses. Pt reports sometimes she will had L foot pain with sudden onset of pain across the midfoot with WB. Pain will go away after about an hour with icing/rest/massaging. No cramping in the calf with her pain but does cause feeling of pulling all the way into the glute.   PERTINENT HISTORY:  spondylolisthesis at L4-5 Osteoporosis, COPD, HTN, CAD, CKD, arthritis, MI in 2001 and 2017 Cervical surgery including on C1,  gastric bypass surgery, L TKR, partial nephrectomy, Bladder incontinence--wear pads  PAIN:  1/10 Current, 10/10 worst   Lumbar and midback It just hurts   PRECAUTIONS: Other: osteoporosis, spondylolisthesis, L TKR, cervical surgery   WEIGHT BEARING RESTRICTIONS: No  FALLS:  Has patient fallen in last 6 months?1, getting up and down pool steps without railing   LIVING ENVIRONMENT: Lives with: lives alone, son and daughter in law come 3-4x/week  Lives in: 1 story home Has following equipment at home: rollator, quad cane   PLOF: Independent  PATIENT GOALS: stand for 1 hour, to be pain free    OBJECTIVE:   DIAGNOSTIC FINDINGS:  CT myelogram 02/11/2013: 1. Small central disc protrusion L2-3 without significant  compressive pathology.  2. Mild multifactorial spinal stenosis L3-4.  3. Advanced facet disease L4-5 allowing grade 1 anterolisthesis  without dynamic instability, and resulting in moderate central canal  stenosis.  4. Asymmetric facet degenerative change L5-S1, left worse than right.   Lumbar x ray in 2019: IMPRESSION: Stable spondylolisthesis at L4-5. No fracture. Lower lumbar scoliosis, stable. Mild disc space narrowing L4-5.   There is aortic atherosclerosis.  PATIENT SURVEYS:  Oswestry Score: 15 / 50 or 30 %   SCREENING FOR RED FLAGS: Bowel or bladder incontinence: Yes: bladder wear pads Spinal tumors: No Cauda equina syndrome: No Compression fracture: No Abdominal aneurysm: No  COGNITION: Overall cognitive status: Within functional limits for tasks assessed     SENSATION: Light touch impaired at L4 on R, L 5 on L    LUMBAR ROM:   Active  A/PROM  eval  Flexion 75%  Extension 50% p! R   Right lateral flexion 100%  Left lateral flexion 100%  Right rotation 50% R p!  Left rotation 50%     (Blank rows =  not tested)    LOWER EXTREMITY MMT:    MMT Right eval Left eval  Hip flexion 26.6 18.3  Hip extension    Hip abduction 16.6  19.7  Hip adduction    Hip internal rotation    Hip external rotation    Knee flexion    Knee extension 17.7 14.4  Ankle dorsiflexion    Ankle plantarflexion    Ankle inversion    Ankle eversion     (Blank rows = not tested)   Lumbar special test: positive slump  GAIT:  Comments: shuffling gait, Bilat toe out, lack of hip extension, Kyphotic posture  Stairs: bilat UE required with ascend and descend, lack control with descent   TUG: 14.1s  TODAY'S TREATMENT:                  [facility] Adult PT Treatment:                                                DATE: 10/07/23 Pt seen for aquatic therapy today.  Treatment took place in water 3.5-4.75 ft in depth at the [facility] pool. Temp of water was 91.  Pt entered/exited the pool via stairs using step to pattern with hand rail.  *walking forward, back and side stepping in 3.6 ft with ue support of barbell *L stretch *Decompression position with noodle wrapped posteriorly ue on corner wall: cycling; hip add/abd; hip flex/ext (extended time) *Ue support on wall: toe raises; heel raises ; hip add/abd; relaxed squats 4.2 ft *1/2 noodle pull down for TrA engagement. VC and demonstration for execution. Wide stance then staggered stances x 8 reps. *HB carry rainbow forward and back x2 widths ea bilaterally then unilaterally *walking between exercises for recovery  Pt requires the buoyancy and hydrostatic pressure of water for support, and to offload joints by unweighting joint load by at least 50 % in navel deep water and by at least 75-80% in chest to neck deep water.  Viscosity of the water is needed for resistance of strengthening. Water current perturbations provides challenge to standing balance requiring increased core activation.      PATIENT EDUCATION:  Education details: current evidence of exercise and back pain, walking program, diagnosis, prognosis, anatomy, exercise progression, DOMS expectations, muscle firing,  envelope  of function, HEP, POC  Person educated: Patient Education method: Explanation Education comprehension: verbalized understanding and needs further education  HOME EXERCISE PROGRAM:                                                                                                                Access Code: [email or web address] URL: [email or web address] Date: 09/15/2023 Prepared by: Dale Call  ASSESSMENT:  CLINICAL IMPRESSION: Pt presents with increased LBP due to lifting case of soda.  She did medicate prior to appointment.  Decompression position and cycling reduces discomfort almost immediately upon beginning session. Trial of  lumbar extension stretching (at pt's request) which increases normal back pain.  Pt edu on dressing and styling hair while sitting for safety. Plan as time allows, to work stair climbing next session.        Initial Impression Patient is a 78 y.o. female who was seen today for physical therapy evaluation and treatment for c/c of LBP, mid back pain, and LE pain. Pt's s/s appear consistent with lumbar radiculopathy, postural deficits, and generalized deconditioning. Pt's pain is highly sensitive and irritable with movement. Pt's is more pain dominant at this time. Pt advised on importance of exercise and compliance with HEP and carryover following PT, though pt appears reluctant to exercise. Plan to continue with aquatic exercise for short bout and then transition onto land at future sessions. Pt would benefit from continued skilled therapy in order to reach goals and maximize functional lumbopelvic strength and ROM for prevention of further functional decline.     OBJECTIVE IMPAIRMENTS: Abnormal gait, decreased activity tolerance, decreased endurance, decreased mobility, difficulty walking, decreased strength, and pain.   ACTIVITY LIMITATIONS: carrying, lifting, sitting, standing, and locomotion level  PARTICIPATION LIMITATIONS: meal prep, cleaning, shopping,  and community activity  PERSONAL FACTORS: Time since onset of injury/illness/exacerbation and 3+ comorbidities: Osteoporosis, COPD, CKD, arthritis, L TKR, cervical surgery, MI in 2001 and 2017 are also affecting patient's functional outcome.   REHAB POTENTIAL: Good  CLINICAL DECISION MAKING: Evolving/moderate complexity  EVALUATION COMPLEXITY: Moderate   GOALS:  SHORT TERM GOALS: Target date: 10/27/2023    Pt will be independent and compliant with HEP for improved pain, strength, tolerance to activity, and function.  Baseline: Goal status: INITIAL  2.Pt will report at least 2 pt reduction on NPRS scale for pain in order to demonstrate functional improvement with household activity, self care, and ADL.  Baseline:  Goal status: INITIAL   LONG TERM GOALS: Target date: 12/08/2023   Pt  will become independent with final HEP in order to demonstrate synthesis of PT education.  Baseline:  Goal status: INITIAL  2.  Pt will be able to demonstrate/report ability to sit/stand/sleep for extended periods of time without pain in order to demonstrate functional improvement and tolerance to static positioning.  Baseline:  Goal status: INITIAL  3.  Pt will be able to demonstrate/report ability to walk >10 mins less than 5/10 pain in order to demonstrate functional improvement and tolerance to exercise and community mobility.  Baseline:  Goal status: INITIAL  4. Pt will demonstrate at least a 12.8 improvement in Oswestry Index in order to demonstrate a clinically significant change in LBP and function.  Baseline:  Goal status: INITIAL     PLAN:  PT FREQUENCY: 1-2x/week  PT DURATION: 12 wks (plan to D/C in 8)  PLANNED INTERVENTIONS: Therapeutic exercises, Therapeutic activity, Neuromuscular re-education, Balance training, Gait training, Patient/Family education, Self Care, Stair training, DME instructions, Aquatic Therapy, Dry Needling, Cryotherapy, Moist heat, Vasopneumatic  device, Ultrasound, Manual therapy, and Re-evaluation.  PLAN FOR NEXT SESSION: core and LE strengthening.  Gait and balance training.  Aquatic for mobility and pain relief    Ronal Foots) Kalem Rockwell MPT 10/07/23 3:32 PM [facility] [address] [place], [place], [address] Phone: [phone]   Fax:  [phone]

## 2023-10-09 ENCOUNTER — Encounter (HOSPITAL_BASED_OUTPATIENT_CLINIC_OR_DEPARTMENT_OTHER): Payer: Self-pay | Admitting: Physical Therapy

## 2023-10-09 ENCOUNTER — Ambulatory Visit (HOSPITAL_BASED_OUTPATIENT_CLINIC_OR_DEPARTMENT_OTHER): Payer: Self-pay | Admitting: Physical Therapy

## 2023-10-09 DIAGNOSIS — M545 Low back pain, unspecified: Secondary | ICD-10-CM | POA: Diagnosis not present

## 2023-10-09 DIAGNOSIS — M546 Pain in thoracic spine: Secondary | ICD-10-CM

## 2023-10-09 DIAGNOSIS — M6281 Muscle weakness (generalized): Secondary | ICD-10-CM

## 2023-10-09 NOTE — Therapy (Signed)
 OUTPATIENT PHYSICAL THERAPY THORACOLUMBAR TREATMENT   Patient Name: Tricia [name] MRN: [id number] DOB:1945-03-11, 78 y.o., female Today's Date: 10/09/2023  END OF SESSION:  PT End of Session - 10/09/23 0853     Visit Number 5    Number of Visits 13    Date for PT Re-Evaluation 12/14/23    Authorization Type HTA    PT Start Time 0849    PT Stop Time 0928    PT Time Calculation (min) 39 min    Activity Tolerance Patient tolerated treatment well    Behavior During Therapy [facility] for tasks assessed/performed             Past Medical History:  Diagnosis Date   Adenomatous polyp of colon 02/2004   Allergy 2000   Sulfur   Anemia    Anxiety    Arthritis    B12 deficiency    CAD (coronary artery disease)    a. s/p remote BMS to LAD;  b. 09/2015 Inf STEMI/VF Arrest: LM nl, LAD 85p (staged PCI 2 days later w/ 3.0x32 Synergy DES), 40p/m, 25d, LCX 60m, RCA 80ost/100p (2.75x32 Synergy DES), 76m, EF 55-65%. // c. 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 [employer]'s 76 gene CancerNext-Expanded + RNAinsight panel otherwise normal. The CancerNext-Expanded gene panel offered by [employer] and includes sequencing, rearrangement, and RNA analysis for the following 76 genes: AIP, ALK, APC, ATM, AXIN2, BAP1, BARD1, BMPR1A, BRCA1, BRCA2, BRIP1, CDC73, CDH1, CDK4, CDKN1B, CDKN*   GERD (gastroesophageal reflux disease)    H/O echocardiogram    a. 09/2015 Echo: EF 60-65%, no rwma, mild AI, mildly dil RA, mild to mod TR.   History of echocardiogram    Echo 3/22: EF 60-65, no  RWMA, GR 1 DD, normal RVSF, mild LAE, RVSP 35.5, mild to moderate TR, trivial AI   Hyperlipidemia    Hypertension    Hypertensive heart disease    Low back pain    l5 disc   Lumbar spinal stenosis 03/03/2019   Morbid obesity (HCC)    Myocardial infarction (HCC) 2001, 2017   Neuromuscular disorder (HCC)    Osteoporosis    PVC's (premature ventricular contractions) 04/16/2017   Holter 3/19: NSR, average heart rate 69, frequent PVCs (5% total beats), rare supraventricular ectopics, no AF/flutter   Sleep apnea 10/03/2009   Resolved after gastric bypass     TOBACCO ABUSE 10/05/2009   Qualifier: Diagnosis of  By: Pietro, MD, CODY Redell Dimes    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 In setting of inferior STEMI.   Past Surgical History:  Procedure Laterality Date   ABDOMINAL HYSTERECTOMY     APPENDECTOMY     BARIATRIC SURGERY     CARDIAC CATHETERIZATION N/A 10/01/2015   Procedure: Left Heart Cath and Coronary Angiography;  Surgeon: Alm LELON Clay, MD;  Location: [facility] INVASIVE CV LAB;  Service: Cardiovascular;  Laterality: N/A;   CARDIAC CATHETERIZATION N/A 10/01/2015   Procedure: Coronary Stent Intervention;  Surgeon: Alm LELON Clay, MD;  Location: [facility] INVASIVE CV LAB;  Service: Cardiovascular;  Laterality: N/A;   CARDIAC CATHETERIZATION N/A 10/03/2015   Procedure: Coronary Stent Intervention;  Surgeon: Debby DELENA Sor, MD;  Location: [facility] INVASIVE CV LAB;  Service: Cardiovascular;  Laterality: N/A;   CARDIAC CATHETERIZATION N/A 10/03/2015   Procedure: Coronary/Graft Angiography;  Surgeon: Debby DELENA Sor, MD;  Location: [facility] INVASIVE CV LAB;  Service: Cardiovascular;  Laterality: N/A;   CARPAL TUNNEL RELEASE Bilateral    CERVICAL DISC SURGERY     CHOLECYSTECTOMY     CORONARY ANGIOPLASTY  2002   EYE SURGERY  2020 January   Cataracts   HERNIA REPAIR     JOINT REPLACEMENT  2003   Knee   LUMBAR LAMINECTOMY     L3-4   NEPHRECTOMY Right    Partial   SPINE SURGERY     C1    TONSILLECTOMY     TOTAL KNEE ARTHROPLASTY Left    TUBAL LIGATION     ULNAR NERVE REPAIR Left    Patient Active Problem List   Diagnosis Date Noted   Right calf pain 05/21/2023   Genetic testing 04/20/2023   History of malignant neoplasm 12/02/2022   Seborrheic dermatitis 12/02/2022   Lower extremity pain 09/01/2022   Dysuria 04/11/2022   Shortness of breath 06/10/2021   Chronic scapular pain 12/11/2020   Poor balance 09/12/2020   Diabetes mellitus (HCC) 08/22/2020   Chronic kidney disease, stage 3, mod decreased GFR (HCC) 03/09/2020   History of partial nephrectomy 09/08/2019   Laryngopharyngeal reflux (LPR) 09/06/2019   Lumbar spinal stenosis 03/03/2019   Lower extremity edema 11/21/2018   PVC's (premature ventricular contractions) 04/16/2017   Hyperparathyroidism, secondary (HCC) 07/06/2016   COPD (chronic obstructive pulmonary disease) (HCC) 04/19/2016   Coronary artery disease involving native coronary artery of native heart without angina pectoris 03/10/2016   History of cardiac arrest 10/01/2015   History of ST elevation myocardial infarction (STEMI) 10/01/2015   GERD (gastroesophageal reflux disease), Rx Protonix  02/14/2014   Hx of gastric bypass 02/14/2014   Former smoker, 50 pack years, quit 2010 02/14/2014   Anemia, B12 deficiency 04/04/2009   Osteoporosis 12/14/2006   Hyperlipidemia associated with type 2 diabetes mellitus (HCC) 09/22/2006   Hypertension associated with diabetes (HCC) 09/22/2006   Osteoarthritis, multiple sites 09/22/2006   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  She reports that her back pain comes and goes.  She does not want to join her local [employer] because she doesn't like facility. Pt reports that she has a recumbent elliptical she has began riding at home - up to 15 min, 2 x this week. Lt foot pain improved.   POOL ACCESS: currently none.     Initial Subjective Pt returns due to spasms returning in her back. Pt still occasionally uses a rollator for walking. Muscle spasms are in the upper and mid back. Pt was referred back again for aquatic therapy. Pt did not end up joining a [employer] or pool facility after last episode. Pt reports she used her son's pool to walk but has not sought another exercise option. Pt reports getting significant relief from last PT episode. Pt has less shooting pains than previously. Leans on a cart with grocery shopping. Pt is still limited with lifting- able to lift heavy but has pain after. Back spasms start after 15 mins of standing. She has history of lumbar canal stenosis at L4-5.  Pt reports shooting pains down leg in sitting, standing, or supine. Worst pain is with carrying/lifting objects including carrying groceries.  Pt is limited with standing duration which impairs her ability to cook. Limits her ability to do do her hair due to the back spasms. Pt does have a recumbent bike at home- seldomly uses. Pt reports sometimes she will had L foot pain with sudden onset of pain across the midfoot with WB. Pain will go away after about an hour with icing/rest/massaging. No cramping in the calf with her pain but does cause feeling of pulling all the way into  the glute.   PERTINENT HISTORY:  spondylolisthesis at L4-5 Osteoporosis, COPD, HTN, CAD, CKD, arthritis, MI in 2001 and 2017 Cervical surgery including on C1, gastric bypass surgery, L TKR, partial nephrectomy, Bladder incontinence--wear pads  PAIN: do you have pain? Yes  NPRS:1-2/10 Current  Location: Lumbar and midback, Rt posterior hip Description: It just hurts      PRECAUTIONS: Other: osteoporosis, spondylolisthesis, L TKR, cervical surgery   WEIGHT BEARING RESTRICTIONS: No  FALLS:  Has patient fallen in last 6 months?1, getting up and down pool steps without railing   LIVING ENVIRONMENT: Lives with: lives alone, son and daughter in law come 3-4x/week  Lives in: 1 story home Has following equipment at home: rollator, quad cane   PLOF: Independent  PATIENT GOALS: stand for 1 hour, to be pain free    OBJECTIVE:   DIAGNOSTIC FINDINGS:  CT myelogram 02/11/2013: 1. Small central disc protrusion L2-3 without significant  compressive pathology.  2. Mild multifactorial spinal stenosis L3-4.  3. Advanced facet disease L4-5 allowing grade 1 anterolisthesis  without dynamic instability, and resulting in moderate central canal  stenosis.  4. Asymmetric facet degenerative change L5-S1, left worse than right.   Lumbar x ray in 2019: IMPRESSION: Stable spondylolisthesis at L4-5. No fracture. Lower lumbar scoliosis, stable. Mild disc space narrowing L4-5.   There is aortic atherosclerosis.  PATIENT SURVEYS:  Oswestry Score: 15 / 50 or 30 %   SCREENING FOR RED FLAGS: Bowel or bladder incontinence: Yes: bladder wear pads Spinal tumors: No Cauda equina syndrome: No Compression fracture: No Abdominal aneurysm: No  COGNITION: Overall cognitive status: Within functional limits for tasks assessed     SENSATION: Light touch impaired at L4 on R, L 5 on  L    LUMBAR ROM:   Active  A/PROM  eval  Flexion 75%  Extension 50% p! R   Right lateral flexion 100%   Left lateral flexion 100%  Right rotation 50% R p!  Left rotation 50%     (Blank rows = not tested)    LOWER EXTREMITY MMT:    MMT Right eval Left eval  Hip flexion 26.6 18.3  Hip extension    Hip abduction 16.6 19.7  Hip adduction    Hip internal rotation    Hip external rotation    Knee flexion    Knee extension 17.7 14.4  Ankle dorsiflexion    Ankle plantarflexion    Ankle inversion    Ankle eversion     (Blank rows = not tested)   Lumbar special test: positive slump  GAIT:  Comments: shuffling gait, Bilat toe out, lack of hip extension, Kyphotic posture  Stairs: bilat UE required with ascend and descend, lack control with descent   TUG: 14.1s  TODAY'S TREATMENT:                  [facility] Adult PT Treatment:                                                DATE: 10/09/23 Pt seen for aquatic therapy today.  Treatment took place in water 3.5-4.75 ft in depth at the [facility] pool. Temp of water was 91.  Pt entered/exited the pool via stairs using step to pattern with hand rail.  *walking forward, back and side stepping in 3.6 ft without support  *L stretch * marching backward/ forward (reports increased sciatica) * UE support on wall: heel raises x 10 ; hip add/abdct 2x10; single leg clams x 10 each; L stretch  * walking forward/ backward in 4+ ft of water (pain eased)  *1/2 noodle pull down for TrA engagement. In Wide stance x 12 * walking forward/ backward with row motion holding 1/2 noodle * side stepping with arm add/abdct with rainbow hand floats  *suitcase carry with walking forward/ backward with bil / single rainbow under water at side *Decompression position with noodle wrapped posteriorly ue on corner wall: cycling; hip add/abd; hip flex/ext (extended time)  Pt requires the buoyancy and hydrostatic pressure of water for support, and to offload joints by unweighting joint load by at least 50 % in navel deep water and by at least 75-80% in chest to  neck deep water.  Viscosity of the water is needed for resistance of strengthening. Water current perturbations provides challenge to standing balance requiring increased core activation.  Pt is making gradual progress towards goals.       PATIENT EDUCATION:  Education details: reacquainting to aquatic therapy exercises  Person educated: Patient Education method: Explanation, demo Education comprehension: verbalized understanding and needs further education  HOME EXERCISE PROGRAM:  Access Code: [email or web address] URL: [email or web address] Date: 09/15/2023 Prepared by: Dale Call  ASSESSMENT:  CLINICAL IMPRESSION: Pt reported some increased spasms in lower back with initial walking in pool; eased with increased depth and L stretches.  She did report some increase in sciatic type symptoms with heel raises and unilateral suitcase carry exercise with rainbow hand float under water at Rt side.   She did not medicate prior to appointment.  Decompression position and cycling reduces discomfort almost immediately upon beginning session. Plan as time allows, to work stair climbing next session.    Initial Impression Patient is a 78 y.o. female who was seen today for physical therapy evaluation and treatment for c/c of LBP, mid back pain, and LE pain. Pt's s/s appear consistent with lumbar radiculopathy, postural deficits, and generalized deconditioning. Pt's pain is highly sensitive and irritable with movement. Pt's is more pain dominant at this time. Pt advised on importance of exercise and compliance with HEP and carryover following PT, though pt appears reluctant to exercise. Plan to continue with aquatic exercise for short bout and then transition onto land at future sessions. Pt would benefit from continued skilled therapy in order to reach goals and maximize functional  lumbopelvic strength and ROM for prevention of further functional decline.     OBJECTIVE IMPAIRMENTS: Abnormal gait, decreased activity tolerance, decreased endurance, decreased mobility, difficulty walking, decreased strength, and pain.   ACTIVITY LIMITATIONS: carrying, lifting, sitting, standing, and locomotion level  PARTICIPATION LIMITATIONS: meal prep, cleaning, shopping, and community activity  PERSONAL FACTORS: Time since onset of injury/illness/exacerbation and 3+ comorbidities: Osteoporosis, COPD, CKD, arthritis, L TKR, cervical surgery, MI in 2001 and 2017 are also affecting patient's functional outcome.   REHAB POTENTIAL: Good  CLINICAL DECISION MAKING: Evolving/moderate complexity  EVALUATION COMPLEXITY: Moderate   GOALS:  SHORT TERM GOALS: Target date: 10/27/2023    Pt will be independent and compliant with HEP for improved pain, strength, tolerance to activity, and function.  Baseline: Goal status: INITIAL  2.Pt will report at least 2 pt reduction on NPRS scale for pain in order to demonstrate functional improvement with household activity, self care, and ADL.  Baseline:  Goal status: INITIAL   LONG TERM GOALS: Target date: 12/08/2023   Pt  will become independent with final HEP in order to demonstrate synthesis of PT education.  Baseline:  Goal status: INITIAL  2.  Pt will be able to demonstrate/report ability to sit/stand/sleep for extended periods of time without pain in order to demonstrate functional improvement and tolerance to static positioning.  Baseline:  Goal status: INITIAL  3.  Pt will be able to demonstrate/report ability to walk >10 mins less than 5/10 pain in order to demonstrate functional improvement and tolerance to exercise and community mobility.  Baseline:  Goal status: INITIAL  4. Pt will demonstrate at least a 12.8 improvement in Oswestry Index in order to demonstrate a clinically significant change in LBP and function.   Baseline:  Goal status: INITIAL     PLAN:  PT FREQUENCY: 1-2x/week  PT DURATION: 12 wks (plan to D/C in 8)  PLANNED INTERVENTIONS: Therapeutic exercises, Therapeutic activity, Neuromuscular re-education, Balance training, Gait training, Patient/Family education, Self Care, Stair training, DME instructions, Aquatic Therapy, Dry Needling, Cryotherapy, Moist heat, Vasopneumatic device, Ultrasound, Manual therapy, and Re-evaluation.  PLAN FOR NEXT SESSION: core and LE strengthening.  Gait and balance training.  Aquatic for mobility and pain relief    Delon Aquas, PTA 10/09/23 9:28 AM [facility] [phone]  [place], [place], [address] Phone: [phone]   Fax:  [phone]

## 2023-10-13 ENCOUNTER — Encounter (HOSPITAL_BASED_OUTPATIENT_CLINIC_OR_DEPARTMENT_OTHER): Payer: Self-pay | Admitting: Family Medicine

## 2023-10-13 ENCOUNTER — Encounter (HOSPITAL_BASED_OUTPATIENT_CLINIC_OR_DEPARTMENT_OTHER): Payer: Self-pay | Admitting: Physical Therapy

## 2023-10-13 ENCOUNTER — Ambulatory Visit (HOSPITAL_BASED_OUTPATIENT_CLINIC_OR_DEPARTMENT_OTHER): Admitting: Physical Therapy

## 2023-10-13 DIAGNOSIS — M6281 Muscle weakness (generalized): Secondary | ICD-10-CM

## 2023-10-13 DIAGNOSIS — M545 Low back pain, unspecified: Secondary | ICD-10-CM

## 2023-10-13 DIAGNOSIS — R262 Difficulty in walking, not elsewhere classified: Secondary | ICD-10-CM

## 2023-10-13 DIAGNOSIS — M546 Pain in thoracic spine: Secondary | ICD-10-CM

## 2023-10-13 NOTE — Therapy (Signed)
 OUTPATIENT PHYSICAL THERAPY THORACOLUMBAR TREATMENT   Patient Name: Tricia [name] MRN: [id number] DOB:24-Mar-1945, 78 y.o., female Today's Date: 10/13/2023  END OF SESSION:  PT End of Session - 10/13/23 1016     Visit Number 6    Number of Visits 13    Date for PT Re-Evaluation 12/14/23    Authorization Type HTA    PT Start Time 1016    PT Stop Time 1055    PT Time Calculation (min) 39 min    Behavior During Therapy WFL for tasks assessed/performed             Past Medical History:  Diagnosis Date   Adenomatous polyp of colon 02/2004   Allergy 2000   Sulfur   Anemia    Anxiety    Arthritis    B12 deficiency    CAD (coronary artery disease)    a. s/p remote BMS to LAD;  b. 09/2015 Inf STEMI/VF Arrest: LM nl, LAD 85p (staged PCI 2 days later w/ 3.0x32 Synergy DES), 40p/m, 25d, LCX 73m, RCA 80ost/100p (2.75x32 Synergy DES), 41m, EF 55-65%. // c. 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 [employer]'s 76 gene CancerNext-Expanded + RNAinsight panel otherwise normal. The CancerNext-Expanded gene panel offered by [employer] and includes sequencing, rearrangement, and RNA analysis for the following 76 genes: AIP, ALK, APC, ATM, AXIN2, BAP1, BARD1, BMPR1A, BRCA1, BRCA2, BRIP1, CDC73, CDH1, CDK4, CDKN1B, CDKN*   GERD (gastroesophageal reflux disease)    H/O echocardiogram    a. 09/2015 Echo: EF 60-65%, no rwma, mild AI, mildly dil RA, mild to mod TR.   History of echocardiogram    Echo 3/22: EF 60-65, no RWMA, GR 1 DD, normal RVSF, mild LAE, RVSP 35.5, mild  to moderate TR, trivial AI   Hyperlipidemia    Hypertension    Hypertensive heart disease    Low back pain    l5 disc   Lumbar spinal stenosis 03/03/2019   Morbid obesity (HCC)    Myocardial infarction (HCC) 2001, 2017   Neuromuscular disorder (HCC)    Osteoporosis    PVC's (premature ventricular contractions) 04/16/2017   Holter 3/19: NSR, average heart rate 69, frequent PVCs (5% total beats), rare supraventricular ectopics, no AF/flutter   Sleep apnea 10/03/2009   Resolved after gastric bypass     TOBACCO ABUSE 10/05/2009   Qualifier: Diagnosis of  By: Pietro, MD, CODY Redell Dimes    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 In setting of inferior STEMI.   Past Surgical History:  Procedure Laterality Date   ABDOMINAL HYSTERECTOMY     APPENDECTOMY     BARIATRIC SURGERY     CARDIAC CATHETERIZATION N/A 10/01/2015   Procedure: Left Heart Cath and Coronary Angiography;  Surgeon: Alm LELON Clay, MD;  [facility] INVASIVE CV LAB;  Service: Cardiovascular;  Laterality: N/A;   CARDIAC CATHETERIZATION N/A 10/01/2015   Procedure: Coronary Stent Intervention;  Surgeon: Alm LELON Clay, MD;  Location: [facility] INVASIVE CV LAB;  Service: Cardiovascular;  Laterality: N/A;   CARDIAC CATHETERIZATION N/A 10/03/2015  Procedure: Coronary Stent Intervention;  Surgeon: Debby DELENA Sor, MD;  Location: [facility] INVASIVE CV LAB;  Service: Cardiovascular;  Laterality: N/A;   CARDIAC CATHETERIZATION N/A 10/03/2015   Procedure: Coronary/Graft Angiography;  Surgeon: Debby DELENA Sor, MD;  Location: [facility] INVASIVE CV LAB;  Service: Cardiovascular;  Laterality: N/A;   CARPAL TUNNEL RELEASE Bilateral    CERVICAL DISC SURGERY     CHOLECYSTECTOMY     CORONARY ANGIOPLASTY  2002   EYE SURGERY  2020 January   Cataracts   HERNIA REPAIR     JOINT REPLACEMENT  2003   Knee   LUMBAR LAMINECTOMY     L3-4   NEPHRECTOMY Right    Partial   SPINE SURGERY     C1   TONSILLECTOMY     TOTAL KNEE ARTHROPLASTY Left     TUBAL LIGATION     ULNAR NERVE REPAIR Left    Patient Active Problem List   Diagnosis Date Noted   Right calf pain 05/21/2023   Genetic testing 04/20/2023   History of malignant neoplasm 12/02/2022   Seborrheic dermatitis 12/02/2022   Lower extremity pain 09/01/2022   Dysuria 04/11/2022   Shortness of breath 06/10/2021   Chronic scapular pain 12/11/2020   Poor balance 09/12/2020   Diabetes mellitus (HCC) 08/22/2020   Chronic kidney disease, stage 3, mod decreased GFR (HCC) 03/09/2020   History of partial nephrectomy 09/08/2019   Laryngopharyngeal reflux (LPR) 09/06/2019   Lumbar spinal stenosis 03/03/2019   Lower extremity edema 11/21/2018   PVC's (premature ventricular contractions) 04/16/2017   Hyperparathyroidism, secondary (HCC) 07/06/2016   COPD (chronic obstructive pulmonary disease) (HCC) 04/19/2016   Coronary artery disease involving native coronary artery of native heart without angina pectoris 03/10/2016   History of cardiac arrest 10/01/2015   History of ST elevation myocardial infarction (STEMI) 10/01/2015   GERD (gastroesophageal reflux disease), Rx Protonix  02/14/2014   Hx of gastric bypass 02/14/2014   Former smoker, 50 pack years, quit 2010 02/14/2014   Anemia, B12 deficiency 04/04/2009   Osteoporosis 12/14/2006   Hyperlipidemia associated with type 2 diabetes mellitus (HCC) 09/22/2006   Hypertension associated with diabetes (HCC) 09/22/2006   Osteoarthritis, multiple sites 09/22/2006   Degenerative joint disease (DJD) of lumbar spine 09/22/2006   Adenomatous polyp of colon 02/04/2004     REFERRING PROVIDER: de [place], Raymond J, MD   REFERRING DIAG:   [phone] (ICD-10-CM) - Spinal stenosis of lumbar region, unspecified whether neurogenic claudication present    Rationale for Evaluation and Treatment: Rehabilitation  THERAPY DIAG:  Pain, lumbar region  Muscle weakness (generalized)  Pain in thoracic spine  Difficulty walking  ONSET DATE: Chronic  back pain and leg weakness ; 3 months ago  SUBJECTIVE:  SUBJECTIVE STATEMENT: Pt reports that she felt ok after she left on Friday.  Today I woke up feeling like I was hit by [facility] truck.   POOL ACCESS: currently none.     Initial Subjective Pt returns due to spasms returning in her back. Pt still occasionally uses a rollator for walking. Muscle spasms are in the upper and mid back. Pt was referred back again for aquatic therapy. Pt did not end up joining a [employer] or pool facility after last episode. Pt reports she used her son's pool to walk but has not sought another exercise option. Pt reports getting significant relief from last PT episode. Pt has less shooting pains than previously. Leans on a cart with grocery shopping. Pt is still limited with lifting- able to lift heavy but has pain after. Back spasms start after 15 mins of standing. She has history of lumbar canal stenosis at L4-5.  Pt reports shooting pains down leg in sitting, standing, or supine. Worst pain is with carrying/lifting objects including carrying groceries.  Pt is limited with standing duration which impairs her ability to cook. Limits her ability to do do her hair due to the back spasms. Pt does have a recumbent bike at home- seldomly uses. Pt reports sometimes she will had L foot pain with sudden onset of pain across the midfoot with WB. Pain will go away after about an hour with icing/rest/massaging. No cramping in the calf with her pain but does cause feeling of pulling all the way into the glute.   PERTINENT HISTORY:  spondylolisthesis at L4-5 Osteoporosis, COPD, HTN, CAD, CKD, arthritis, MI in 2001 and 2017 Cervical surgery including on C1, gastric bypass surgery, L TKR, partial nephrectomy, Bladder incontinence--wear pads  PAIN: do  you have pain? Yes  NPRS:1-2/10 if sitting, 4-5/10 if standing and walking  Location: Lumbar and midback, Lt hip and down side of leg to knee  Description: It just hurts - side of left thigh, shots.    PRECAUTIONS: Other: osteoporosis, spondylolisthesis, L TKR, cervical surgery   WEIGHT BEARING RESTRICTIONS: No  FALLS:  Has patient fallen in last 6 months?1, getting up and down pool steps without railing   LIVING ENVIRONMENT: Lives with: lives alone, son and daughter in law come 3-4x/week  Lives in: 1 story home Has following equipment at home: rollator, quad cane   PLOF: Independent  PATIENT GOALS: stand for 1 hour, to be pain free    OBJECTIVE:   DIAGNOSTIC FINDINGS:  CT myelogram 02/11/2013: 1. Small central disc protrusion L2-3 without significant  compressive pathology.  2. Mild multifactorial spinal stenosis L3-4.  3. Advanced facet disease L4-5 allowing grade 1 anterolisthesis  without dynamic instability, and resulting in moderate central canal  stenosis.  4. Asymmetric facet degenerative change L5-S1, left worse than right.   Lumbar x ray in 2019: IMPRESSION: Stable spondylolisthesis at L4-5. No fracture. Lower lumbar scoliosis, stable. Mild disc space narrowing L4-5.   There is aortic atherosclerosis.  PATIENT SURVEYS:  Oswestry Score: 15 / 50 or 30 %   SCREENING FOR RED FLAGS: Bowel or bladder incontinence: Yes: bladder wear pads Spinal tumors: No Cauda equina syndrome: No Compression fracture: No Abdominal aneurysm: No  COGNITION: Overall cognitive status: Within functional limits for tasks assessed     SENSATION: Light touch impaired at L4 on R, L 5 on L    LUMBAR ROM:   Active  A/PROM  eval  Flexion 75%  Extension 50% p! R   Right lateral flexion  100%  Left lateral flexion 100%  Right rotation 50% R p!  Left rotation 50%     (Blank rows = not tested)    LOWER EXTREMITY MMT:    MMT Right eval Left eval  Hip flexion 26.6  18.3  Hip extension    Hip abduction 16.6 19.7  Hip adduction    Hip internal rotation    Hip external rotation    Knee flexion    Knee extension 17.7 14.4  Ankle dorsiflexion    Ankle plantarflexion    Ankle inversion    Ankle eversion     (Blank rows = not tested)   Lumbar special test: positive slump  GAIT:  Comments: shuffling gait, Bilat toe out, lack of hip extension, Kyphotic posture  Stairs: bilat UE required with ascend and descend, lack control with descent   TUG: 14.1s  TODAY'S TREATMENT:                  [facility] Adult PT Treatment:                                                DATE: 10/13/23 Pt seen for aquatic therapy today.  Treatment took place in water 3.5-4.75 ft in depth at the [facility] pool. Temp of water was 91.  Pt entered/exited the pool via stairs using step to pattern with hand rail.  *Decompression position with noodle wrapped posteriorly ue on corner wall: cycling; hip add/abd; hip flex/ext (extended time) *walking forward, backward 4 ft with UE on noodle * side stepping with arms unsupported, cues to increase step height *L stretch at wall *1/2 noodle pull down for TrA engagement. In Wide stance x 10; in staggered stance x 10 each * walking forward/ backward with row motion holding 1/2 noodle *suitcase carry with walking forward/ backward single blue 1/2 noodle under water at side *L stretch at wall * forward step up/ retro step down x 5 each LE, light UE support on wall x 5 each (challenge)  Pt requires the buoyancy and hydrostatic pressure of water for support, and to offload joints by unweighting joint load by at least 50 % in navel deep water and by at least 75-80% in chest to neck deep water.  Viscosity of the water is needed for resistance of strengthening. Water current perturbations provides challenge to standing balance requiring increased core activation.  Pt is making gradual progress towards goals.       PATIENT EDUCATION:   Education details: reacquainting to aquatic therapy exercises  Person educated: Patient Education method: Explanation, demo Education comprehension: verbalized understanding and needs further education  HOME EXERCISE PROGRAM:                                                                                                                Access Code: [email or web address] URL: [email or web address] Date: 09/15/2023 Prepared by: Dale Call  ASSESSMENT:  CLINICAL IMPRESSION: Pt reported continued generalized soreness in body during session; no change in pain rating.  She reported no episodes of spasms.  Stair climbing with LLE remains challenging. Will continue to progress as tolerated.     Initial Impression Patient is a 78 y.o. female who was seen today for physical therapy evaluation and treatment for c/c of LBP, mid back pain, and LE pain. Pt's s/s appear consistent with lumbar radiculopathy, postural deficits, and generalized deconditioning. Pt's pain is highly sensitive and irritable with movement. Pt's is more pain dominant at this time. Pt advised on importance of exercise and compliance with HEP and carryover following PT, though pt appears reluctant to exercise. Plan to continue with aquatic exercise for short bout and then transition onto land at future sessions. Pt would benefit from continued skilled therapy in order to reach goals and maximize functional lumbopelvic strength and ROM for prevention of further functional decline.     OBJECTIVE IMPAIRMENTS: Abnormal gait, decreased activity tolerance, decreased endurance, decreased mobility, difficulty walking, decreased strength, and pain.   ACTIVITY LIMITATIONS: carrying, lifting, sitting, standing, and locomotion level  PARTICIPATION LIMITATIONS: meal prep, cleaning, shopping, and community activity  PERSONAL FACTORS: Time since onset of injury/illness/exacerbation and 3+ comorbidities: Osteoporosis, COPD, CKD, arthritis, L  TKR, cervical surgery, MI in 2001 and 2017 are also affecting patient's functional outcome.   REHAB POTENTIAL: Good  CLINICAL DECISION MAKING: Evolving/moderate complexity  EVALUATION COMPLEXITY: Moderate   GOALS:  SHORT TERM GOALS: Target date: 10/27/2023    Pt will be independent and compliant with HEP for improved pain, strength, tolerance to activity, and function.  Baseline: Goal status: INITIAL  2.Pt will report at least 2 pt reduction on NPRS scale for pain in order to demonstrate functional improvement with household activity, self care, and ADL.  Baseline:  Goal status: INITIAL   LONG TERM GOALS: Target date: 12/08/2023   Pt  will become independent with final HEP in order to demonstrate synthesis of PT education.  Baseline:  Goal status: INITIAL  2.  Pt will be able to demonstrate/report ability to sit/stand/sleep for extended periods of time without pain in order to demonstrate functional improvement and tolerance to static positioning.  Baseline:  Goal status: INITIAL  3.  Pt will be able to demonstrate/report ability to walk >10 mins less than 5/10 pain in order to demonstrate functional improvement and tolerance to exercise and community mobility.  Baseline:  Goal status: INITIAL  4. Pt will demonstrate at least a 12.8 improvement in Oswestry Index in order to demonstrate a clinically significant change in LBP and function.  Baseline:  Goal status: INITIAL     PLAN:  PT FREQUENCY: 1-2x/week  PT DURATION: 12 wks (plan to D/C in 8)  PLANNED INTERVENTIONS: Therapeutic exercises, Therapeutic activity, Neuromuscular re-education, Balance training, Gait training, Patient/Family education, Self Care, Stair training, DME instructions, Aquatic Therapy, Dry Needling, Cryotherapy, Moist heat, Vasopneumatic device, Ultrasound, Manual therapy, and Re-evaluation.  PLAN FOR NEXT SESSION: core and LE strengthening.  Gait and balance training.  Aquatic for mobility  and pain relief   Delon Aquas, PTA 10/13/23 11:08 AM [facility] [address] [place], [place], [address] Phone: [phone]   Fax:  [phone]

## 2023-10-14 ENCOUNTER — Other Ambulatory Visit (HOSPITAL_BASED_OUTPATIENT_CLINIC_OR_DEPARTMENT_OTHER): Payer: Self-pay | Admitting: *Deleted

## 2023-10-14 ENCOUNTER — Other Ambulatory Visit (HOSPITAL_BASED_OUTPATIENT_CLINIC_OR_DEPARTMENT_OTHER): Payer: Self-pay

## 2023-10-14 MED ORDER — COVID-19 MRNA VAC-TRIS(PFIZER) 30 MCG/0.3ML IM SUSY
0.3000 mL | PREFILLED_SYRINGE | Freq: Once | INTRAMUSCULAR | 0 refills | Status: AC
  Filled 2023-10-14: qty 0.3, 1d supply, fill #0

## 2023-10-15 ENCOUNTER — Ambulatory Visit (HOSPITAL_BASED_OUTPATIENT_CLINIC_OR_DEPARTMENT_OTHER): Admitting: Physical Therapy

## 2023-10-15 ENCOUNTER — Encounter (HOSPITAL_BASED_OUTPATIENT_CLINIC_OR_DEPARTMENT_OTHER): Payer: Self-pay | Admitting: Physical Therapy

## 2023-10-15 DIAGNOSIS — M545 Low back pain, unspecified: Secondary | ICD-10-CM

## 2023-10-15 DIAGNOSIS — M546 Pain in thoracic spine: Secondary | ICD-10-CM

## 2023-10-15 DIAGNOSIS — M6281 Muscle weakness (generalized): Secondary | ICD-10-CM

## 2023-10-15 NOTE — Therapy (Signed)
 OUTPATIENT PHYSICAL THERAPY THORACOLUMBAR TREATMENT   Patient Name: Tricia [name] MRN: [id number] DOB:1945/10/09, 78 y.o., female Today's Date: 10/15/2023  END OF SESSION:  PT End of Session - 10/15/23 0932     Visit Number 7    Number of Visits 13    Date for PT Re-Evaluation 12/14/23    Authorization Type HTA    PT Start Time 0846    PT Stop Time 0924    PT Time Calculation (min) 38 min    Activity Tolerance Patient limited by pain    Behavior During Therapy [facility] for tasks assessed/performed              Past Medical History:  Diagnosis Date   Adenomatous polyp of colon 02/2004   Allergy 2000   Sulfur   Anemia    Anxiety    Arthritis    B12 deficiency    CAD (coronary artery disease)    a. s/p remote BMS to LAD;  b. 09/2015 Inf STEMI/VF Arrest: LM nl, LAD 85p (staged PCI 2 days later w/ 3.0x32 Synergy DES), 40p/m, 25d, LCX 19m, RCA 80ost/100p (2.75x32 Synergy DES), 50m, EF 55-65%. // c. 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 [employer]'s 76 gene CancerNext-Expanded + RNAinsight panel otherwise normal. The CancerNext-Expanded gene panel offered by [employer] and includes sequencing, rearrangement, and RNA analysis for the following 76 genes: AIP, ALK, APC, ATM, AXIN2, BAP1, BARD1, BMPR1A, BRCA1, BRCA2, BRIP1, CDC73, CDH1, CDK4, CDKN1B, CDKN*   GERD (gastroesophageal reflux disease)    H/O echocardiogram    a. 09/2015 Echo: EF 60-65%, no rwma, mild AI, mildly dil RA, mild to mod TR.   History of echocardiogram    Echo 3/22: EF 60-65, no RWMA,  GR 1 DD, normal RVSF, mild LAE, RVSP 35.5, mild to moderate TR, trivial AI   Hyperlipidemia    Hypertension    Hypertensive heart disease    Low back pain    l5 disc   Lumbar spinal stenosis 03/03/2019   Morbid obesity (HCC)    Myocardial infarction (HCC) 2001, 2017   Neuromuscular disorder (HCC)    Osteoporosis    PVC's (premature ventricular contractions) 04/16/2017   Holter 3/19: NSR, average heart rate 69, frequent PVCs (5% total beats), rare supraventricular ectopics, no AF/flutter   Sleep apnea 10/03/2009   Resolved after gastric bypass     TOBACCO ABUSE 10/05/2009   Qualifier: Diagnosis of  By: Pietro, MD, CODY Redell Dimes    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 In setting of inferior STEMI.   Past Surgical History:  Procedure Laterality Date   ABDOMINAL HYSTERECTOMY     APPENDECTOMY     BARIATRIC SURGERY     CARDIAC CATHETERIZATION N/A 10/01/2015   Procedure: Left Heart Cath and Coronary Angiography;  Surgeon: Alm LELON Clay, MD;  Location: [facility] INVASIVE CV LAB;  Service: Cardiovascular;  Laterality: N/A;   CARDIAC CATHETERIZATION N/A 10/01/2015   Procedure: Coronary Stent Intervention;  Surgeon: Alm LELON Clay, MD;  Location[facility] INVASIVE CV LAB;  Service:  Cardiovascular;  Laterality: N/A;   CARDIAC CATHETERIZATION N/A 10/03/2015   Procedure: Coronary Stent Intervention;  Surgeon: Debby DELENA Sor, MD;  Location: [facility] INVASIVE CV LAB;  Service: Cardiovascular;  Laterality: N/A;   CARDIAC CATHETERIZATION N/A 10/03/2015   Procedure: Coronary/Graft Angiography;  Surgeon: Debby DELENA Sor, MD;  Location: [facility] INVASIVE CV LAB;  Service: Cardiovascular;  Laterality: N/A;   CARPAL TUNNEL RELEASE Bilateral    CERVICAL DISC SURGERY     CHOLECYSTECTOMY     CORONARY ANGIOPLASTY  2002   EYE SURGERY  2020 January   Cataracts   HERNIA REPAIR     JOINT REPLACEMENT  2003   Knee   LUMBAR LAMINECTOMY     L3-4   NEPHRECTOMY Right    Partial   SPINE SURGERY     C1    TONSILLECTOMY     TOTAL KNEE ARTHROPLASTY Left    TUBAL LIGATION     ULNAR NERVE REPAIR Left    Patient Active Problem List   Diagnosis Date Noted   Right calf pain 05/21/2023   Genetic testing 04/20/2023   History of malignant neoplasm 12/02/2022   Seborrheic dermatitis 12/02/2022   Lower extremity pain 09/01/2022   Dysuria 04/11/2022   Shortness of breath 06/10/2021   Chronic scapular pain 12/11/2020   Poor balance 09/12/2020   Diabetes mellitus (HCC) 08/22/2020   Chronic kidney disease, stage 3, mod decreased GFR (HCC) 03/09/2020   History of partial nephrectomy 09/08/2019   Laryngopharyngeal reflux (LPR) 09/06/2019   Lumbar spinal stenosis 03/03/2019   Lower extremity edema 11/21/2018   PVC's (premature ventricular contractions) 04/16/2017   Hyperparathyroidism, secondary (HCC) 07/06/2016   COPD (chronic obstructive pulmonary disease) (HCC) 04/19/2016   Coronary artery disease involving native coronary artery of native heart without angina pectoris 03/10/2016   History of cardiac arrest 10/01/2015   History of ST elevation myocardial infarction (STEMI) 10/01/2015   GERD (gastroesophageal reflux disease), Rx Protonix  02/14/2014   Hx of gastric bypass 02/14/2014   Former smoker, 50 pack years, quit 2010 02/14/2014   Anemia, B12 deficiency 04/04/2009   Osteoporosis 12/14/2006   Hyperlipidemia associated with type 2 diabetes mellitus (HCC) 09/22/2006   Hypertension associated with diabetes (HCC) 09/22/2006   Osteoarthritis, multiple sites 09/22/2006   Degenerative joint disease (DJD) of lumbar spine 09/22/2006   Adenomatous polyp of colon 02/04/2004     REFERRING PROVIDER: de Peru, Raymond J, MD   REFERRING DIAG:   [phone] (ICD-10-CM) - Spinal stenosis of lumbar region, unspecified whether neurogenic claudication present    Rationale for Evaluation and Treatment: Rehabilitation  THERAPY DIAG:  Pain, lumbar region  Muscle weakness (generalized)  Pain in thoracic  spine  ONSET DATE: Chronic back pain and leg weakness ; 3 months ago  SUBJECTIVE:  SUBJECTIVE STATEMENT: Pt reports increase in duration of pain in last week, more constant.  Thinks she may have to back off of PT a little bit if it doesn't improve in the next few days  Pain right LB 4/10.   POOL ACCESS: currently none.     Initial Subjective Pt returns due to spasms returning in her back. Pt still occasionally uses a rollator for walking. Muscle spasms are in the upper and mid back. Pt was referred back again for aquatic therapy. Pt did not end up joining a [employer] or pool facility after last episode. Pt reports she used her son's pool to walk but has not sought another exercise option. Pt reports getting significant relief from last PT episode. Pt has less shooting pains than previously. Leans on a cart with grocery shopping. Pt is still limited with lifting- able to lift heavy but has pain after. Back spasms start after 15 mins of standing. She has history of lumbar canal stenosis at L4-5.  Pt reports shooting pains down leg in sitting, standing, or supine. Worst pain is with carrying/lifting objects including carrying groceries.  Pt is limited with standing duration which impairs her ability to cook. Limits her ability to do do her hair due to the back spasms. Pt does have a recumbent bike at home- seldomly uses. Pt reports sometimes she will had L foot pain with sudden onset of pain across the midfoot with WB. Pain will go away after about an hour with icing/rest/massaging. No cramping in the calf with her pain but does cause feeling of pulling all the way into the glute.   PERTINENT HISTORY:  spondylolisthesis at L4-5 Osteoporosis, COPD, HTN, CAD, CKD, arthritis, MI in 2001 and 2017 Cervical surgery including on  C1, gastric bypass surgery, L TKR, partial nephrectomy, Bladder incontinence--wear pads  PAIN: do you have pain? Yes  NPRS:1-2/10 if sitting, 4-5/10 if standing and walking  Location: Lumbar and midback, Rt (correction 10/15/23) hip and down side of leg to knee  Description: It just hurts - side of left thigh, shots.    PRECAUTIONS: Other: osteoporosis, spondylolisthesis, L TKR, cervical surgery   WEIGHT BEARING RESTRICTIONS: No  FALLS:  Has patient fallen in last 6 months?1, getting up and down pool steps without railing   LIVING ENVIRONMENT: Lives with: lives alone, son and daughter in law come 3-4x/week  Lives in: 1 story home Has following equipment at home: rollator, quad cane   PLOF: Independent  PATIENT GOALS: stand for 1 hour, to be pain free    OBJECTIVE:   DIAGNOSTIC FINDINGS:  CT myelogram 02/11/2013: 1. Small central disc protrusion L2-3 without significant  compressive pathology.  2. Mild multifactorial spinal stenosis L3-4.  3. Advanced facet disease L4-5 allowing grade 1 anterolisthesis  without dynamic instability, and resulting in moderate central canal  stenosis.  4. Asymmetric facet degenerative change L5-S1, left worse than right.   Lumbar x ray in 2019: IMPRESSION: Stable spondylolisthesis at L4-5. No fracture. Lower lumbar scoliosis, stable. Mild disc space narrowing L4-5.   There is aortic atherosclerosis.  PATIENT SURVEYS:  Oswestry Score: 15 / 50 or 30 %   SCREENING FOR RED FLAGS: Bowel or bladder incontinence: Yes: bladder wear pads Spinal tumors: No Cauda equina syndrome: No Compression fracture: No Abdominal aneurysm: No  COGNITION: Overall cognitive status: Within functional limits for tasks assessed     SENSATION: Light touch impaired at L4 on R, L 5 on L    LUMBAR ROM:   Active  A/PROM  eval  Flexion 75%  Extension 50% p! R   Right lateral flexion 100%  Left lateral flexion 100%  Right rotation 50% R p!  Left  rotation 50%     (Blank rows = not tested)    LOWER EXTREMITY MMT:    MMT Right eval Left eval  Hip flexion 26.6 18.3  Hip extension    Hip abduction 16.6 19.7  Hip adduction    Hip internal rotation    Hip external rotation    Knee flexion    Knee extension 17.7 14.4  Ankle dorsiflexion    Ankle plantarflexion    Ankle inversion    Ankle eversion     (Blank rows = not tested)   Lumbar special test: positive slump  GAIT:  Comments: shuffling gait, Bilat toe out, lack of hip extension, Kyphotic posture  Stairs: bilat UE required with ascend and descend, lack control with descent   TUG: 14.1s  TODAY'S TREATMENT:                  [facility] Adult PT Treatment:                                                DATE: 10/15/23 Pt seen for aquatic therapy today.  Treatment took place in water 3.5-4.75 ft in depth at the [facility] pool. Temp of water was 91.  Pt entered/exited the pool via stairs using step to pattern with hand rail.   *walking forward, backward 4 ft with UE on noodle * side stepping with arms unsupported *L stretch at wall *suitcase carry with walking forward/ backward RB HB forward/back bilaterally then unilaterally *Decompression position with noodle wrapped posteriorly ue on corner wall: cycling; hip add/abd; hip flex/ext (extended time) *1/2 noodle pull down for TrA engagement. In Wide stance x 10; in staggered stance x 10 each * walking forward/ backward with row motion holding 1/2 noodle   * forward step up/ retro step down x 5 each LE, light UE support on wall x 5 each (challenge)   Pt requires the buoyancy and hydrostatic pressure of water for support, and to offload joints by unweighting joint load by at least 50 % in navel deep water and by at least 75-80% in chest to neck deep water.  Viscosity of the water is needed for resistance of strengthening. Water current perturbations provides challenge to standing balance requiring increased core  activation.  Pt is making gradual progress towards goals.       PATIENT EDUCATION:  Education details: reacquainting to aquatic therapy exercises  Person educated: Patient Education method: Explanation, demo Education comprehension: verbalized understanding and needs further education  HOME EXERCISE PROGRAM:                                                                                                                Access Code: [email or web address]  URL: [email or web address] Date: 09/15/2023 Prepared by: Dale Call  ASSESSMENT:  CLINICAL IMPRESSION: Pt reports increase in chronic nature of pain with less times during day without pain.  She feels the PT may be contributing.  Will decide over weekend if she will continue or delay.  She is encouraged to call and let us  know.  She does have complete reduction of back pain with vertical suspension/decompression using noodle and spends extended time in activity. Dialed back session some for improved toleration. Fair toleration overall today. Post session Jacuzzi for water massage (unbilled)      Initial Impression Patient is a 78 y.o. female who was seen today for physical therapy evaluation and treatment for c/c of LBP, mid back pain, and LE pain. Pt's s/s appear consistent with lumbar radiculopathy, postural deficits, and generalized deconditioning. Pt's pain is highly sensitive and irritable with movement. Pt's is more pain dominant at this time. Pt advised on importance of exercise and compliance with HEP and carryover following PT, though pt appears reluctant to exercise. Plan to continue with aquatic exercise for short bout and then transition onto land at future sessions. Pt would benefit from continued skilled therapy in order to reach goals and maximize functional lumbopelvic strength and ROM for prevention of further functional decline.     OBJECTIVE IMPAIRMENTS: Abnormal gait, decreased activity tolerance, decreased endurance,  decreased mobility, difficulty walking, decreased strength, and pain.   ACTIVITY LIMITATIONS: carrying, lifting, sitting, standing, and locomotion level  PARTICIPATION LIMITATIONS: meal prep, cleaning, shopping, and community activity  PERSONAL FACTORS: Time since onset of injury/illness/exacerbation and 3+ comorbidities: Osteoporosis, COPD, CKD, arthritis, L TKR, cervical surgery, MI in 2001 and 2017 are also affecting patient's functional outcome.   REHAB POTENTIAL: Good  CLINICAL DECISION MAKING: Evolving/moderate complexity  EVALUATION COMPLEXITY: Moderate   GOALS:  SHORT TERM GOALS: Target date: 10/27/2023    Pt will be independent and compliant with HEP for improved pain, strength, tolerance to activity, and function.  Baseline: Goal status: In progress 10/15/23  2.Pt will report at least 2 pt reduction on NPRS scale for pain in order to demonstrate functional improvement with household activity, self care, and ADL.  Baseline:  Goal status: In progress 10/15/23   LONG TERM GOALS: Target date: 12/08/2023   Pt  will become independent with final HEP in order to demonstrate synthesis of PT education.  Baseline:  Goal status: INITIAL  2.  Pt will be able to demonstrate/report ability to sit/stand/sleep for extended periods of time without pain in order to demonstrate functional improvement and tolerance to static positioning.  Baseline:  Goal status: INITIAL  3.  Pt will be able to demonstrate/report ability to walk >10 mins less than 5/10 pain in order to demonstrate functional improvement and tolerance to exercise and community mobility.  Baseline:  Goal status: INITIAL  4. Pt will demonstrate at least a 12.8 improvement in Oswestry Index in order to demonstrate a clinically significant change in LBP and function.  Baseline:  Goal status: INITIAL     PLAN:  PT FREQUENCY: 1-2x/week  PT DURATION: 12 wks (plan to D/C in 8)  PLANNED INTERVENTIONS: Therapeutic  exercises, Therapeutic activity, Neuromuscular re-education, Balance training, Gait training, Patient/Family education, Self Care, Stair training, DME instructions, Aquatic Therapy, Dry Needling, Cryotherapy, Moist heat, Vasopneumatic device, Ultrasound, Manual therapy, and Re-evaluation.  PLAN FOR NEXT SESSION: core and LE strengthening.  Gait and balance training.  Aquatic for mobility and pain relief   Ronal Kem) Annie Saephan MPT 10/15/23 9:33 AM  [facility] [address] [place], [place], [address] Phone: [phone]   Fax:  [phone]

## 2023-10-16 ENCOUNTER — Other Ambulatory Visit: Payer: Self-pay

## 2023-10-19 ENCOUNTER — Ambulatory Visit (HOSPITAL_BASED_OUTPATIENT_CLINIC_OR_DEPARTMENT_OTHER): Admitting: Physical Therapy

## 2023-10-19 ENCOUNTER — Other Ambulatory Visit (HOSPITAL_BASED_OUTPATIENT_CLINIC_OR_DEPARTMENT_OTHER): Payer: Self-pay

## 2023-10-19 ENCOUNTER — Encounter (HOSPITAL_BASED_OUTPATIENT_CLINIC_OR_DEPARTMENT_OTHER): Payer: Self-pay | Admitting: Physical Therapy

## 2023-10-19 DIAGNOSIS — M546 Pain in thoracic spine: Secondary | ICD-10-CM

## 2023-10-19 DIAGNOSIS — M6281 Muscle weakness (generalized): Secondary | ICD-10-CM

## 2023-10-19 DIAGNOSIS — M545 Low back pain, unspecified: Secondary | ICD-10-CM

## 2023-10-19 MED ORDER — COMIRNATY 30 MCG/0.3ML IM SUSY
0.3000 mL | PREFILLED_SYRINGE | Freq: Once | INTRAMUSCULAR | 0 refills | Status: AC
  Filled 2023-10-19: qty 0.3, 1d supply, fill #0

## 2023-10-19 MED ORDER — FLUZONE HIGH-DOSE 0.5 ML IM SUSY
0.5000 mL | PREFILLED_SYRINGE | Freq: Once | INTRAMUSCULAR | 0 refills | Status: AC
  Filled 2023-10-19: qty 0.5, 1d supply, fill #0

## 2023-10-19 NOTE — Therapy (Signed)
 [facility] [address] [place], [place], [address] Phone: [phone]   Fax:  [phone]  Patient Details  Name: AILYNE PAWLEY MRN: [id number] Date of Birth: Jun 02, 1945 Referring Provider:  de [place], Quintin PARAS, MD  Encounter Date: 10/19/2023  Patient arrived, but was not treated.   No charge.   Patient reporting increased pain following aquatic therapy visits.  Discussed moving remaining aquatic therapy appointments to land-based visits, to create land based HEP for back strengthening since patient does not have access to pool and doesn't anticipate access to pool in future.  Patient declined to do land-based visit this date, but agreeable to next upcoming appointment.   Delon Aquas, PTA 10/19/23 10:04 AM [facility] [address] [place], [place], [address] Phone: [phone]   Fax:  [phone]

## 2023-10-20 ENCOUNTER — Other Ambulatory Visit (HOSPITAL_BASED_OUTPATIENT_CLINIC_OR_DEPARTMENT_OTHER): Payer: Self-pay | Admitting: Family Medicine

## 2023-10-22 ENCOUNTER — Encounter (HOSPITAL_BASED_OUTPATIENT_CLINIC_OR_DEPARTMENT_OTHER): Admitting: Physical Therapy

## 2023-10-22 ENCOUNTER — Other Ambulatory Visit: Payer: Self-pay

## 2023-10-23 ENCOUNTER — Other Ambulatory Visit: Payer: Self-pay

## 2023-10-26 ENCOUNTER — Ambulatory Visit (HOSPITAL_BASED_OUTPATIENT_CLINIC_OR_DEPARTMENT_OTHER): Admitting: Physical Therapy

## 2023-10-29 ENCOUNTER — Ambulatory Visit (HOSPITAL_BASED_OUTPATIENT_CLINIC_OR_DEPARTMENT_OTHER): Admitting: Physical Therapy

## 2023-11-16 ENCOUNTER — Other Ambulatory Visit (HOSPITAL_COMMUNITY): Payer: Self-pay

## 2023-11-16 ENCOUNTER — Other Ambulatory Visit: Payer: Self-pay

## 2023-11-16 ENCOUNTER — Encounter (HOSPITAL_BASED_OUTPATIENT_CLINIC_OR_DEPARTMENT_OTHER): Payer: Self-pay | Admitting: Family Medicine

## 2023-11-16 ENCOUNTER — Other Ambulatory Visit (HOSPITAL_BASED_OUTPATIENT_CLINIC_OR_DEPARTMENT_OTHER): Payer: Self-pay | Admitting: Family Medicine

## 2023-11-16 DIAGNOSIS — M150 Primary generalized (osteo)arthritis: Secondary | ICD-10-CM

## 2023-11-16 DIAGNOSIS — E1169 Type 2 diabetes mellitus with other specified complication: Secondary | ICD-10-CM

## 2023-11-16 DIAGNOSIS — I152 Hypertension secondary to endocrine disorders: Secondary | ICD-10-CM

## 2023-11-16 DIAGNOSIS — K219 Gastro-esophageal reflux disease without esophagitis: Secondary | ICD-10-CM

## 2023-11-16 MED ORDER — EZETIMIBE-SIMVASTATIN 10-80 MG PO TABS
1.0000 | ORAL_TABLET | Freq: Every day | ORAL | 3 refills | Status: AC
  Filled 2023-11-26: qty 30, 30d supply, fill #0
  Filled 2023-12-22: qty 30, 30d supply, fill #1
  Filled 2024-01-25: qty 30, 30d supply, fill #2
  Filled 2024-02-26: qty 30, 30d supply, fill #3

## 2023-11-16 MED ORDER — PANTOPRAZOLE SODIUM 40 MG PO TBEC
40.0000 mg | DELAYED_RELEASE_TABLET | Freq: Two times a day (BID) | ORAL | 11 refills | Status: AC
  Filled 2023-11-26: qty 60, 30d supply, fill #0
  Filled 2023-12-22: qty 60, 30d supply, fill #1
  Filled 2024-02-26: qty 60, 30d supply, fill #3
  Filled ????-??-??: fill #2

## 2023-11-16 MED ORDER — HYDROCODONE-ACETAMINOPHEN 10-325 MG PO TABS: 1.0000 | ORAL_TABLET | Freq: Every day | ORAL | 0 refills | Status: DC | PRN

## 2023-11-20 ENCOUNTER — Other Ambulatory Visit (HOSPITAL_BASED_OUTPATIENT_CLINIC_OR_DEPARTMENT_OTHER): Payer: Self-pay | Admitting: Family Medicine

## 2023-11-20 ENCOUNTER — Ambulatory Visit (HOSPITAL_BASED_OUTPATIENT_CLINIC_OR_DEPARTMENT_OTHER): Admitting: Family Medicine

## 2023-11-20 ENCOUNTER — Other Ambulatory Visit (HOSPITAL_COMMUNITY): Payer: Self-pay

## 2023-11-20 ENCOUNTER — Other Ambulatory Visit: Payer: Self-pay

## 2023-11-20 DIAGNOSIS — Z0000 Encounter for general adult medical examination without abnormal findings: Secondary | ICD-10-CM

## 2023-11-20 MED ORDER — ALBUTEROL SULFATE HFA 108 (90 BASE) MCG/ACT IN AERS
2.0000 | INHALATION_SPRAY | Freq: Four times a day (QID) | RESPIRATORY_TRACT | 3 refills | Status: AC | PRN
  Filled 2023-11-20: qty 6.7, 25d supply, fill #0

## 2023-11-23 ENCOUNTER — Other Ambulatory Visit: Payer: Self-pay

## 2023-11-23 ENCOUNTER — Other Ambulatory Visit (HOSPITAL_COMMUNITY): Payer: Self-pay

## 2023-11-25 ENCOUNTER — Ambulatory Visit (HOSPITAL_BASED_OUTPATIENT_CLINIC_OR_DEPARTMENT_OTHER): Admitting: Family Medicine

## 2023-11-26 ENCOUNTER — Other Ambulatory Visit: Payer: Self-pay

## 2023-11-27 ENCOUNTER — Other Ambulatory Visit: Payer: Self-pay

## 2023-11-30 ENCOUNTER — Other Ambulatory Visit: Payer: Self-pay

## 2023-12-01 ENCOUNTER — Ambulatory Visit (INDEPENDENT_AMBULATORY_CARE_PROVIDER_SITE_OTHER): Admitting: Family Medicine

## 2023-12-01 VITALS — BP 116/69 | HR 61 | Ht 61.0 in | Wt 198.3 lb

## 2023-12-01 DIAGNOSIS — Z0000 Encounter for general adult medical examination without abnormal findings: Secondary | ICD-10-CM

## 2023-12-01 DIAGNOSIS — M48061 Spinal stenosis, lumbar region without neurogenic claudication: Secondary | ICD-10-CM | POA: Diagnosis not present

## 2023-12-01 DIAGNOSIS — Z9884 Bariatric surgery status: Secondary | ICD-10-CM

## 2023-12-01 DIAGNOSIS — Z87891 Personal history of nicotine dependence: Secondary | ICD-10-CM

## 2023-12-01 DIAGNOSIS — Z780 Asymptomatic menopausal state: Secondary | ICD-10-CM

## 2023-12-01 DIAGNOSIS — Z7984 Long term (current) use of oral hypoglycemic drugs: Secondary | ICD-10-CM | POA: Diagnosis not present

## 2023-12-01 DIAGNOSIS — N1832 Chronic kidney disease, stage 3b: Secondary | ICD-10-CM

## 2023-12-01 DIAGNOSIS — E119 Type 2 diabetes mellitus without complications: Secondary | ICD-10-CM

## 2023-12-01 DIAGNOSIS — Z122 Encounter for screening for malignant neoplasm of respiratory organs: Secondary | ICD-10-CM

## 2023-12-01 DIAGNOSIS — Z1231 Encounter for screening mammogram for malignant neoplasm of breast: Secondary | ICD-10-CM

## 2023-12-01 LAB — POCT GLYCOSYLATED HEMOGLOBIN (HGB A1C): Hemoglobin A1C: 5.7 % — AB (ref 4.0–5.6)

## 2023-12-01 NOTE — Assessment & Plan Note (Signed)
 Patient previously referred to physical therapy which she found benefit from in the past.  However, with recent PT treatment, she did not see the same improvement in symptoms.  She ultimately discontinued PT.  Since stopping PT, symptoms have gradually lessened. We discussed considerations and symptoms are not overly bothersome, that she would prefer to proceed with monitoring for now.  Ultimately, if symptoms are worsening or causing more issues, would likely proceed with referral to spine specialist.  In the past, she has met with neurologist

## 2023-12-01 NOTE — Assessment & Plan Note (Signed)
 Patient continues with Ozempic , has been taking 2 mg dose.  She reports that she has been doing well with medication.  Denies any significant issues with side effects or symptoms.  She has continued to note some weight loss. A1c checked in office today and remains well-controlled at 5.7%.  She is due for foot exam, however declines having this done today.  Would prefer to do this at next office visit.

## 2023-12-01 NOTE — Progress Notes (Signed)
    Procedures performed today:    None.  Independent interpretation of notes and tests performed by another provider:   None.  Brief History, Exam, Impression, and Recommendations:    BP 116/69 (BP Location: Left Arm, Patient Position: Sitting, Cuff Size: Normal)   Pulse 61   Ht 5' 1 (1.549 m)   Wt 198 lb 4.8 oz (89.9 kg)   LMP  (LMP Unknown)   SpO2 96%   BMI 37.47 kg/m   Type 2 diabetes mellitus without complication, without long-term current use of insulin  (HCC) Assessment & Plan: Patient continues with Ozempic , has been taking 2 mg dose.  She reports that she has been doing well with medication.  Denies any significant issues with side effects or symptoms.  She has continued to note some weight loss. A1c checked in office today and remains well-controlled at 5.7%.  She is due for foot exam, however declines having this done today.  Would prefer to do this at next office visit.  Orders: -     POCT glycosylated hemoglobin (Hb A1C)  Spinal stenosis of lumbar region, unspecified whether neurogenic claudication present Assessment & Plan: Patient previously referred to physical therapy which she found benefit from in the past.  However, with recent PT treatment, she did not see the same improvement in symptoms.  She ultimately discontinued PT.  Since stopping PT, symptoms have gradually lessened. We discussed considerations and symptoms are not overly bothersome, that she would prefer to proceed with monitoring for now.  Ultimately, if symptoms are worsening or causing more issues, would likely proceed with referral to spine specialist.  In the past, she has met with neurologist   Annual physical exam -     CBC with Differential/Platelet; Future -     Comprehensive metabolic panel with GFR; Future -     Hemoglobin A1c; Future -     Lipid panel; Future -     TSH Rfx on Abnormal to Free T4; Future -     VITAMIN D  25 Hydroxy (Vit-D Deficiency, Fractures); Future -     Iron , TIBC  and Ferritin Panel; Future  Visit for screening mammogram -     3D Screening Mammogram, Left and Right  Post-menopausal -     DG Bone Density  Personal history of tobacco use, presenting hazards to health -     CT CHEST LUNG CANCER SCREENING LOW DOSE WO CONTRAST  Screening for malignant neoplasm of respiratory organ -     CT CHEST LUNG CANCER SCREENING LOW DOSE WO CONTRAST  Hx of gastric bypass -     Iron , TIBC and Ferritin Panel; Future  Stage 3b chronic kidney disease (HCC) -     VITAMIN D  25 Hydroxy (Vit-D Deficiency, Fractures); Future  Return in about 3 months (around 03/02/2024) for CPE with fasting labs 1 week prior, diabetes.     ___________________________________________ Stepheny [place], MD, ABFM, CAQSM Primary Care and Sports Medicine [facility]

## 2023-12-02 ENCOUNTER — Other Ambulatory Visit: Payer: Self-pay

## 2023-12-07 ENCOUNTER — Encounter: Payer: Self-pay | Admitting: Radiology

## 2023-12-22 ENCOUNTER — Other Ambulatory Visit: Payer: Self-pay

## 2023-12-23 ENCOUNTER — Other Ambulatory Visit: Payer: Self-pay

## 2023-12-24 ENCOUNTER — Other Ambulatory Visit: Payer: Self-pay

## 2023-12-28 ENCOUNTER — Other Ambulatory Visit: Payer: Self-pay

## 2023-12-29 ENCOUNTER — Other Ambulatory Visit: Payer: Self-pay

## 2023-12-30 ENCOUNTER — Other Ambulatory Visit: Payer: Self-pay

## 2024-01-01 ENCOUNTER — Encounter (HOSPITAL_BASED_OUTPATIENT_CLINIC_OR_DEPARTMENT_OTHER): Payer: Self-pay | Admitting: Family Medicine

## 2024-01-01 DIAGNOSIS — E1169 Type 2 diabetes mellitus with other specified complication: Secondary | ICD-10-CM

## 2024-01-01 DIAGNOSIS — E1121 Type 2 diabetes mellitus with diabetic nephropathy: Secondary | ICD-10-CM

## 2024-01-01 DIAGNOSIS — N1831 Chronic kidney disease, stage 3a: Secondary | ICD-10-CM

## 2024-01-04 MED ORDER — DAPAGLIFLOZIN PROPANEDIOL 10 MG PO TABS: 10.0000 mg | ORAL_TABLET | Freq: Every day | ORAL | 3 refills | Status: AC

## 2024-01-10 ENCOUNTER — Other Ambulatory Visit (HOSPITAL_BASED_OUTPATIENT_CLINIC_OR_DEPARTMENT_OTHER): Payer: Self-pay | Admitting: Family Medicine

## 2024-01-11 ENCOUNTER — Encounter (HOSPITAL_BASED_OUTPATIENT_CLINIC_OR_DEPARTMENT_OTHER): Payer: Self-pay | Admitting: Family Medicine

## 2024-01-20 ENCOUNTER — Other Ambulatory Visit (HOSPITAL_COMMUNITY): Payer: Self-pay

## 2024-01-20 ENCOUNTER — Other Ambulatory Visit: Payer: Self-pay

## 2024-01-22 ENCOUNTER — Encounter (HOSPITAL_BASED_OUTPATIENT_CLINIC_OR_DEPARTMENT_OTHER): Payer: Self-pay | Admitting: Family Medicine

## 2024-01-22 DIAGNOSIS — M150 Primary generalized (osteo)arthritis: Secondary | ICD-10-CM

## 2024-01-25 ENCOUNTER — Other Ambulatory Visit (HOSPITAL_COMMUNITY): Payer: Self-pay

## 2024-01-25 ENCOUNTER — Other Ambulatory Visit: Payer: Self-pay

## 2024-01-25 NOTE — Addendum Note (Signed)
 Addended by: RAYANN REXENE HERO on: 01/25/2024 11:54 AM   Modules accepted: Orders

## 2024-01-26 MED ORDER — HYDROCODONE-ACETAMINOPHEN 10-325 MG PO TABS: 1.0000 | ORAL_TABLET | Freq: Every day | ORAL | 0 refills | Status: AC | PRN

## 2024-02-21 ENCOUNTER — Encounter (HOSPITAL_BASED_OUTPATIENT_CLINIC_OR_DEPARTMENT_OTHER): Payer: Self-pay | Admitting: Family Medicine

## 2024-02-22 NOTE — Telephone Encounter (Signed)
 Tricia [name], any idea if there is anything we need to do about pt's mychart message?

## 2024-02-23 ENCOUNTER — Other Ambulatory Visit (HOSPITAL_BASED_OUTPATIENT_CLINIC_OR_DEPARTMENT_OTHER): Payer: Self-pay | Admitting: Family Medicine

## 2024-02-23 DIAGNOSIS — N1832 Chronic kidney disease, stage 3b: Secondary | ICD-10-CM

## 2024-02-23 DIAGNOSIS — Z9884 Bariatric surgery status: Secondary | ICD-10-CM

## 2024-02-23 DIAGNOSIS — Z0000 Encounter for general adult medical examination without abnormal findings: Secondary | ICD-10-CM

## 2024-02-24 ENCOUNTER — Other Ambulatory Visit: Payer: Self-pay

## 2024-02-24 LAB — COMPREHENSIVE METABOLIC PANEL WITH GFR
ALT: 12 IU/L (ref 0–32)
AST: 14 IU/L (ref 0–40)
Albumin: 4 g/dL (ref 3.8–4.8)
Alkaline Phosphatase: 169 IU/L — ABNORMAL HIGH (ref 49–135)
BUN/Creatinine Ratio: 19 (ref 12–28)
BUN: 19 mg/dL (ref 8–27)
Bilirubin Total: 0.4 mg/dL (ref 0.0–1.2)
CO2: 23 mmol/L (ref 20–29)
Calcium: 10.1 mg/dL (ref 8.7–10.3)
Chloride: 106 mmol/L (ref 96–106)
Creatinine, Ser: 1.01 mg/dL — ABNORMAL HIGH (ref 0.57–1.00)
Globulin, Total: 2.3 g/dL (ref 1.5–4.5)
Glucose: 102 mg/dL — ABNORMAL HIGH (ref 70–99)
Potassium: 5.1 mmol/L (ref 3.5–5.2)
Sodium: 144 mmol/L (ref 134–144)
Total Protein: 6.3 g/dL (ref 6.0–8.5)
eGFR: 58 mL/min/1.73 — ABNORMAL LOW

## 2024-02-24 LAB — CBC WITH DIFFERENTIAL/PLATELET
Basophils Absolute: 0.1 x10E3/uL (ref 0.0–0.2)
Basos: 1 %
EOS (ABSOLUTE): 0.2 x10E3/uL (ref 0.0–0.4)
Eos: 3 %
Hematocrit: 45 % (ref 34.0–46.6)
Hemoglobin: 14.2 g/dL (ref 11.1–15.9)
Immature Grans (Abs): 0 x10E3/uL (ref 0.0–0.1)
Immature Granulocytes: 0 %
Lymphocytes Absolute: 1.6 x10E3/uL (ref 0.7–3.1)
Lymphs: 22 %
MCH: 27.1 pg (ref 26.6–33.0)
MCHC: 31.6 g/dL (ref 31.5–35.7)
MCV: 86 fL (ref 79–97)
Monocytes Absolute: 0.5 x10E3/uL (ref 0.1–0.9)
Monocytes: 7 %
Neutrophils Absolute: 4.7 x10E3/uL (ref 1.4–7.0)
Neutrophils: 67 %
Platelets: 242 x10E3/uL (ref 150–450)
RBC: 5.24 x10E6/uL (ref 3.77–5.28)
RDW: 12.4 % (ref 11.7–15.4)
WBC: 7.1 x10E3/uL (ref 3.4–10.8)

## 2024-02-24 LAB — IRON,TIBC AND FERRITIN PANEL
Ferritin: 19 ng/mL (ref 15–150)
Iron Saturation: 21 % (ref 15–55)
Iron: 82 ug/dL (ref 27–139)
Total Iron Binding Capacity: 395 ug/dL (ref 250–450)
UIBC: 313 ug/dL (ref 118–369)

## 2024-02-24 LAB — LIPID PANEL
Chol/HDL Ratio: 2.5 ratio (ref 0.0–4.4)
Cholesterol, Total: 125 mg/dL (ref 100–199)
HDL: 51 mg/dL
LDL Chol Calc (NIH): 55 mg/dL (ref 0–99)
Triglycerides: 105 mg/dL (ref 0–149)
VLDL Cholesterol Cal: 19 mg/dL (ref 5–40)

## 2024-02-24 LAB — HEMOGLOBIN A1C
Est. average glucose Bld gHb Est-mCnc: 123 mg/dL
Hgb A1c MFr Bld: 5.9 % — ABNORMAL HIGH (ref 4.8–5.6)

## 2024-02-24 LAB — TSH RFX ON ABNORMAL TO FREE T4: TSH: 3.73 u[IU]/mL (ref 0.450–4.500)

## 2024-02-24 LAB — VITAMIN D 25 HYDROXY (VIT D DEFICIENCY, FRACTURES): Vit D, 25-Hydroxy: 40.9 ng/mL (ref 30.0–100.0)

## 2024-02-26 ENCOUNTER — Other Ambulatory Visit: Payer: Self-pay

## 2024-02-26 ENCOUNTER — Other Ambulatory Visit (HOSPITAL_COMMUNITY): Payer: Self-pay

## 2024-02-27 ENCOUNTER — Other Ambulatory Visit (HOSPITAL_BASED_OUTPATIENT_CLINIC_OR_DEPARTMENT_OTHER): Payer: Self-pay

## 2024-02-29 ENCOUNTER — Telehealth (HOSPITAL_BASED_OUTPATIENT_CLINIC_OR_DEPARTMENT_OTHER): Payer: Self-pay | Admitting: Family Medicine

## 2024-02-29 NOTE — Telephone Encounter (Signed)
 Copied from CRM [id number]. Topic: Appointments - Scheduling Inquiry for Clinic >> Feb 29, 2024 11:28 AM Selinda RAMAN wrote: Reason for CRM: The patient called in to reschedule her physical that was scheduled for Wednesday 03/02/24 due to weather issues and her driveway and street being unsafe. She would like a call if she can be reschedule way sooner then her currently rescheduled appointment on 06/15/24 do to her rescheduling being out of her control due to weather. Please assist patient further

## 2024-03-01 ENCOUNTER — Other Ambulatory Visit (HOSPITAL_COMMUNITY): Payer: Self-pay

## 2024-03-01 ENCOUNTER — Encounter (HOSPITAL_COMMUNITY): Payer: Self-pay

## 2024-03-01 ENCOUNTER — Ambulatory Visit (HOSPITAL_BASED_OUTPATIENT_CLINIC_OR_DEPARTMENT_OTHER): Payer: Self-pay | Admitting: Family Medicine

## 2024-03-01 ENCOUNTER — Other Ambulatory Visit (HOSPITAL_BASED_OUTPATIENT_CLINIC_OR_DEPARTMENT_OTHER): Payer: Self-pay

## 2024-03-01 ENCOUNTER — Other Ambulatory Visit: Payer: Self-pay

## 2024-03-02 ENCOUNTER — Encounter (HOSPITAL_BASED_OUTPATIENT_CLINIC_OR_DEPARTMENT_OTHER): Admitting: Family Medicine

## 2024-03-03 ENCOUNTER — Other Ambulatory Visit: Payer: Self-pay

## 2024-03-11 ENCOUNTER — Encounter (HOSPITAL_BASED_OUTPATIENT_CLINIC_OR_DEPARTMENT_OTHER)

## 2024-05-02 ENCOUNTER — Ambulatory Visit (HOSPITAL_BASED_OUTPATIENT_CLINIC_OR_DEPARTMENT_OTHER): Admitting: Radiology

## 2024-05-02 ENCOUNTER — Ambulatory Visit (HOSPITAL_BASED_OUTPATIENT_CLINIC_OR_DEPARTMENT_OTHER)

## 2024-05-02 ENCOUNTER — Other Ambulatory Visit (HOSPITAL_BASED_OUTPATIENT_CLINIC_OR_DEPARTMENT_OTHER)

## 2024-06-15 ENCOUNTER — Encounter (HOSPITAL_BASED_OUTPATIENT_CLINIC_OR_DEPARTMENT_OTHER): Admitting: Family Medicine
